# Patient Record
Sex: Female | Born: 1965 | Race: White | Hispanic: No | Smoking: Former smoker
Health system: Southern US, Community
[De-identification: ages and names within clinical notes are randomized; demographics above are authoritative.]

## PROBLEM LIST (undated history)

## (undated) DIAGNOSIS — M545 Low back pain, unspecified: Secondary | ICD-10-CM

## (undated) DIAGNOSIS — D6851 Activated protein C resistance: Principal | ICD-10-CM

## (undated) DIAGNOSIS — R7303 Prediabetes: Secondary | ICD-10-CM

## (undated) DIAGNOSIS — D519 Vitamin B12 deficiency anemia, unspecified: Secondary | ICD-10-CM

## (undated) DIAGNOSIS — G629 Polyneuropathy, unspecified: Secondary | ICD-10-CM

## (undated) DIAGNOSIS — O99891 Other specified diseases and conditions complicating pregnancy: Secondary | ICD-10-CM

## (undated) DIAGNOSIS — R7301 Impaired fasting glucose: Secondary | ICD-10-CM

## (undated) DIAGNOSIS — Z9889 Other specified postprocedural states: Secondary | ICD-10-CM

## (undated) DIAGNOSIS — N390 Urinary tract infection, site not specified: Secondary | ICD-10-CM

## (undated) DIAGNOSIS — F32A Depression, unspecified: Secondary | ICD-10-CM

## (undated) DIAGNOSIS — O9989 Other specified diseases and conditions complicating pregnancy, childbirth and the puerperium: Secondary | ICD-10-CM

## (undated) DIAGNOSIS — G894 Chronic pain syndrome: Secondary | ICD-10-CM

## (undated) DIAGNOSIS — R112 Nausea with vomiting, unspecified: Secondary | ICD-10-CM

## (undated) DIAGNOSIS — R270 Ataxia, unspecified: Secondary | ICD-10-CM

## (undated) DIAGNOSIS — M797 Fibromyalgia: Secondary | ICD-10-CM

## (undated) DIAGNOSIS — F329 Major depressive disorder, single episode, unspecified: Secondary | ICD-10-CM

## (undated) DIAGNOSIS — F419 Anxiety disorder, unspecified: Secondary | ICD-10-CM

## (undated) DIAGNOSIS — J42 Unspecified chronic bronchitis: Secondary | ICD-10-CM

## (undated) DIAGNOSIS — J449 Chronic obstructive pulmonary disease, unspecified: Secondary | ICD-10-CM

## (undated) DIAGNOSIS — M6281 Muscle weakness (generalized): Secondary | ICD-10-CM

## (undated) DIAGNOSIS — M1990 Unspecified osteoarthritis, unspecified site: Secondary | ICD-10-CM

## (undated) DIAGNOSIS — G8929 Other chronic pain: Secondary | ICD-10-CM

## (undated) DIAGNOSIS — K219 Gastro-esophageal reflux disease without esophagitis: Secondary | ICD-10-CM

## (undated) DIAGNOSIS — F509 Eating disorder, unspecified: Secondary | ICD-10-CM

## (undated) DIAGNOSIS — Z9189 Other specified personal risk factors, not elsewhere classified: Secondary | ICD-10-CM

## (undated) DIAGNOSIS — R609 Edema, unspecified: Secondary | ICD-10-CM

## (undated) DIAGNOSIS — I10 Essential (primary) hypertension: Secondary | ICD-10-CM

## (undated) DIAGNOSIS — G35 Multiple sclerosis: Secondary | ICD-10-CM

## (undated) DIAGNOSIS — G43909 Migraine, unspecified, not intractable, without status migrainosus: Secondary | ICD-10-CM

## (undated) HISTORY — PX: ABDOMINAL HYSTERECTOMY: SHX81

## (undated) HISTORY — DX: Eating disorder, unspecified: F50.9

## (undated) HISTORY — DX: Low back pain, unspecified: M54.50

## (undated) HISTORY — DX: Activated protein C resistance: D68.51

## (undated) HISTORY — DX: Migraine, unspecified, not intractable, without status migrainosus: G43.909

## (undated) HISTORY — DX: Essential (primary) hypertension: I10

## (undated) HISTORY — DX: Other specified personal risk factors, not elsewhere classified: Z91.89

## (undated) HISTORY — DX: Other chronic pain: G89.29

## (undated) HISTORY — PX: TONSILLECTOMY: SUR1361

## (undated) HISTORY — PX: ANTERIOR CERVICAL DECOMP/DISCECTOMY FUSION: SHX1161

## (undated) HISTORY — DX: Other specified diseases and conditions complicating pregnancy, childbirth and the puerperium: O99.89

## (undated) HISTORY — DX: Impaired fasting glucose: R73.01

## (undated) HISTORY — DX: Other specified diseases and conditions complicating pregnancy: O99.891

## (undated) HISTORY — DX: Low back pain: M54.5

## (undated) HISTORY — PX: APPENDECTOMY: SHX54

## (undated) HISTORY — DX: Fibromyalgia: M79.7

## (undated) NOTE — *Deleted (*Deleted)
Transition of Care [facility]) - CM/SW Discharge Note   Patient Details  Name: [name] MRN: [id number] Date of Birth: Jul 31, 1966  Transition of Care [facility]) CM/SW Contact:  Barry Brunner, LCSW Phone Number: 08/29/2020, 10:29 AM   Clinical Narrative:    CSW received HH orders for patient. CSW spoke with patient to identify if patient agreeable to [facility]. Patient declined [facility] services and reported that she had an aid. CSW informed patient that [facility] would provide physical therapy separate from the help the aid provides. Patient reported that she did not feel physical therapy would be effective after participating in OPPT with the [facility] location. Patient continues to decline HH. CSW notified of patient's need for ambulance transport. CSW completed med necessity and contacted EMS. TOC signing off.    Final next level of care: Home/Self Care     Patient Goals and CMS Choice        Discharge Placement                Patient to be transferred to facility by: N/A Name of family member notified: N/A Patient and family notified of of transfer: 08/29/20  Discharge Plan and Services                                     Social Determinants of Health (SDOH) Interventions     Readmission Risk Interventions No flowsheet data found.

---

## 2002-07-29 ENCOUNTER — Ambulatory Visit (HOSPITAL_COMMUNITY): Admission: RE | Admit: 2002-07-29 | Discharge: 2002-07-29 | Payer: Self-pay | Admitting: Family Medicine

## 2002-07-29 ENCOUNTER — Encounter: Payer: Self-pay | Admitting: Family Medicine

## 2002-08-07 ENCOUNTER — Observation Stay (HOSPITAL_COMMUNITY): Admission: RE | Admit: 2002-08-07 | Discharge: 2002-08-08 | Payer: Self-pay | Admitting: Obstetrics and Gynecology

## 2002-12-04 ENCOUNTER — Encounter: Payer: Self-pay | Admitting: Family Medicine

## 2002-12-04 ENCOUNTER — Ambulatory Visit (HOSPITAL_COMMUNITY): Admission: RE | Admit: 2002-12-04 | Discharge: 2002-12-04 | Payer: Self-pay | Admitting: Family Medicine

## 2003-01-23 ENCOUNTER — Ambulatory Visit (HOSPITAL_COMMUNITY): Admission: RE | Admit: 2003-01-23 | Discharge: 2003-01-23 | Payer: Self-pay | Admitting: Family Medicine

## 2003-01-23 ENCOUNTER — Encounter: Payer: Self-pay | Admitting: Family Medicine

## 2003-04-17 ENCOUNTER — Encounter (HOSPITAL_COMMUNITY): Admission: RE | Admit: 2003-04-17 | Discharge: 2003-05-17 | Payer: Self-pay | Admitting: Family Medicine

## 2003-04-17 ENCOUNTER — Encounter: Payer: Self-pay | Admitting: Family Medicine

## 2003-07-22 ENCOUNTER — Ambulatory Visit (HOSPITAL_COMMUNITY): Admission: RE | Admit: 2003-07-22 | Discharge: 2003-07-22 | Payer: Self-pay | Admitting: Family Medicine

## 2003-07-22 ENCOUNTER — Encounter: Payer: Self-pay | Admitting: Family Medicine

## 2003-09-09 ENCOUNTER — Ambulatory Visit (HOSPITAL_COMMUNITY): Admission: RE | Admit: 2003-09-09 | Discharge: 2003-09-09 | Payer: Self-pay | Admitting: Family Medicine

## 2003-10-30 ENCOUNTER — Ambulatory Visit (HOSPITAL_COMMUNITY): Admission: RE | Admit: 2003-10-30 | Discharge: 2003-10-30 | Payer: Self-pay | Admitting: Family Medicine

## 2005-04-20 ENCOUNTER — Ambulatory Visit: Payer: Self-pay | Admitting: Orthopedic Surgery

## 2005-05-29 ENCOUNTER — Ambulatory Visit (HOSPITAL_COMMUNITY): Admission: RE | Admit: 2005-05-29 | Discharge: 2005-05-29 | Payer: Self-pay | Admitting: Family Medicine

## 2005-05-31 ENCOUNTER — Ambulatory Visit (HOSPITAL_COMMUNITY): Admission: RE | Admit: 2005-05-31 | Discharge: 2005-05-31 | Payer: Self-pay | Admitting: Family Medicine

## 2005-12-15 ENCOUNTER — Ambulatory Visit (HOSPITAL_COMMUNITY): Admission: RE | Admit: 2005-12-15 | Discharge: 2005-12-15 | Payer: Self-pay | Admitting: Family Medicine

## 2007-06-28 ENCOUNTER — Ambulatory Visit (HOSPITAL_COMMUNITY): Admission: RE | Admit: 2007-06-28 | Discharge: 2007-06-28 | Payer: Self-pay | Admitting: Family Medicine

## 2007-10-30 ENCOUNTER — Encounter: Admission: RE | Admit: 2007-10-30 | Discharge: 2007-10-30 | Payer: Self-pay | Admitting: Neurosurgery

## 2007-11-25 ENCOUNTER — Ambulatory Visit (HOSPITAL_COMMUNITY): Admission: RE | Admit: 2007-11-25 | Discharge: 2007-11-25 | Payer: Self-pay | Admitting: Neurosurgery

## 2007-12-26 ENCOUNTER — Encounter: Admission: RE | Admit: 2007-12-26 | Discharge: 2007-12-26 | Payer: Self-pay | Admitting: Neurosurgery

## 2008-02-06 ENCOUNTER — Encounter: Admission: RE | Admit: 2008-02-06 | Discharge: 2008-02-06 | Payer: Self-pay | Admitting: Neurosurgery

## 2008-02-21 ENCOUNTER — Encounter: Admission: RE | Admit: 2008-02-21 | Discharge: 2008-02-21 | Payer: Self-pay | Admitting: Neurosurgery

## 2008-03-19 ENCOUNTER — Encounter: Admission: RE | Admit: 2008-03-19 | Discharge: 2008-03-19 | Payer: Self-pay | Admitting: Neurosurgery

## 2008-05-19 ENCOUNTER — Encounter: Admission: RE | Admit: 2008-05-19 | Discharge: 2008-05-19 | Payer: Self-pay | Admitting: Neurosurgery

## 2008-07-16 ENCOUNTER — Encounter: Admission: RE | Admit: 2008-07-16 | Discharge: 2008-07-16 | Payer: Self-pay | Admitting: Neurosurgery

## 2008-08-26 ENCOUNTER — Encounter: Admission: RE | Admit: 2008-08-26 | Discharge: 2008-08-26 | Payer: Self-pay | Admitting: Neurosurgery

## 2008-09-23 ENCOUNTER — Encounter: Admission: RE | Admit: 2008-09-23 | Discharge: 2008-09-23 | Payer: Self-pay | Admitting: Neurosurgery

## 2008-11-17 ENCOUNTER — Ambulatory Visit (HOSPITAL_COMMUNITY): Admission: RE | Admit: 2008-11-17 | Discharge: 2008-11-17 | Payer: Self-pay | Admitting: Family Medicine

## 2008-11-25 ENCOUNTER — Ambulatory Visit (HOSPITAL_COMMUNITY): Admission: RE | Admit: 2008-11-25 | Discharge: 2008-11-25 | Payer: Self-pay | Admitting: Family Medicine

## 2008-11-27 ENCOUNTER — Ambulatory Visit (HOSPITAL_COMMUNITY): Admission: RE | Admit: 2008-11-27 | Discharge: 2008-11-27 | Payer: Self-pay | Admitting: Family Medicine

## 2009-02-05 ENCOUNTER — Ambulatory Visit (HOSPITAL_COMMUNITY): Admission: RE | Admit: 2009-02-05 | Discharge: 2009-02-05 | Payer: Self-pay | Admitting: Pulmonary Disease

## 2009-03-24 ENCOUNTER — Ambulatory Visit: Payer: Self-pay | Admitting: Cardiology

## 2009-03-31 ENCOUNTER — Ambulatory Visit: Payer: Self-pay | Admitting: Cardiovascular Disease

## 2009-03-31 ENCOUNTER — Inpatient Hospital Stay (HOSPITAL_BASED_OUTPATIENT_CLINIC_OR_DEPARTMENT_OTHER): Admission: RE | Admit: 2009-03-31 | Discharge: 2009-03-31 | Payer: Self-pay | Admitting: Cardiovascular Disease

## 2009-04-15 DIAGNOSIS — R079 Chest pain, unspecified: Secondary | ICD-10-CM | POA: Insufficient documentation

## 2009-04-15 DIAGNOSIS — R0602 Shortness of breath: Secondary | ICD-10-CM | POA: Insufficient documentation

## 2009-04-15 DIAGNOSIS — R42 Dizziness and giddiness: Secondary | ICD-10-CM | POA: Insufficient documentation

## 2009-04-16 ENCOUNTER — Ambulatory Visit: Payer: Self-pay | Admitting: Cardiology

## 2009-04-28 ENCOUNTER — Ambulatory Visit (HOSPITAL_COMMUNITY): Admission: RE | Admit: 2009-04-28 | Discharge: 2009-04-28 | Payer: Self-pay | Admitting: Family Medicine

## 2009-11-11 ENCOUNTER — Encounter: Admission: RE | Admit: 2009-11-11 | Discharge: 2009-11-11 | Payer: Self-pay | Admitting: Neurosurgery

## 2009-11-19 ENCOUNTER — Ambulatory Visit (HOSPITAL_COMMUNITY): Admission: RE | Admit: 2009-11-19 | Discharge: 2009-11-19 | Payer: Self-pay | Admitting: Family Medicine

## 2010-02-11 ENCOUNTER — Ambulatory Visit (HOSPITAL_COMMUNITY): Admission: RE | Admit: 2010-02-11 | Discharge: 2010-02-11 | Payer: Self-pay | Admitting: Family Medicine

## 2010-03-11 ENCOUNTER — Encounter: Admission: RE | Admit: 2010-03-11 | Discharge: 2010-03-11 | Payer: Self-pay | Admitting: Neurosurgery

## 2010-04-21 ENCOUNTER — Encounter: Admission: RE | Admit: 2010-04-21 | Discharge: 2010-04-21 | Payer: Self-pay | Admitting: Neurosurgery

## 2010-06-02 ENCOUNTER — Encounter: Admission: RE | Admit: 2010-06-02 | Discharge: 2010-06-02 | Payer: Self-pay | Admitting: Neurosurgery

## 2010-07-19 ENCOUNTER — Encounter: Admission: RE | Admit: 2010-07-19 | Discharge: 2010-07-19 | Payer: Self-pay | Admitting: Neurosurgery

## 2010-08-18 ENCOUNTER — Encounter: Admission: RE | Admit: 2010-08-18 | Discharge: 2010-08-18 | Payer: Self-pay | Admitting: Neurosurgery

## 2010-10-30 ENCOUNTER — Encounter: Payer: Self-pay | Admitting: Family Medicine

## 2010-11-17 ENCOUNTER — Other Ambulatory Visit (HOSPITAL_COMMUNITY): Payer: Self-pay | Admitting: Neurosurgery

## 2010-11-17 ENCOUNTER — Other Ambulatory Visit: Payer: Self-pay | Admitting: Neurosurgery

## 2010-11-17 ENCOUNTER — Ambulatory Visit
Admission: RE | Admit: 2010-11-17 | Discharge: 2010-11-17 | Disposition: A | Discharge status: Home / Self Care | Payer: Self-pay | Source: Ambulatory Visit | Attending: Neurosurgery | Admitting: Neurosurgery

## 2010-11-17 DIAGNOSIS — M542 Cervicalgia: Secondary | ICD-10-CM

## 2011-01-18 LAB — BLOOD GAS, ARTERIAL
Acid-base deficit: 0.5 mmol/L (ref 0.0–2.0)
pCO2 arterial: 39.3 mmHg (ref 35.0–45.0)

## 2011-02-21 NOTE — Assessment & Plan Note (Signed)
[facility]                       [place] [facility] OFFICE NOTE   NAME:[name], [name]                       MRN:          [id number]  DATE:03/24/2009                            DOB:          25-Mar-1966    REASON FOR REFERRAL:  Chest pain, shortness of breath, and dizziness.   HISTORY OF PRESENT ILLNESS:  Ms. [name] is a 45 year old female patient  with long history of smoking, who presents for further evaluation from  Dr. Fletcher Anon office.  She has never had a history of coronary artery  disease.  She tells me that she had a history of hypertension at one  point, but took medications that made her blood pressure increase.  She  is no longer on medications.  She has recently noted exertional chest  discomfort and shortness of breath.  This has probably been going on for  last 2-3 months.  She walks on a daily basis.  This usually occurs with  walking.  It resolves with rest.  It is described as a substernal  heaviness/tightness.  She denies any radiating symptoms.  She notes  significant shortness of breath associated with it.  She does note  associated diaphoresis and nausea.  She denies syncope.  She has noted  some problems with dizziness.  She really describes this as a spinning  sensation.  The symptoms can occur with certain head movements or  changes in positioning.  She denies any syncope.  She denies any  dizziness brought on by exertion.  She has checked her pressures several  times and associated her symptoms with hypotension.  The high systolic  reading, she got was 111 and the lowest she got was 99.  She has noted  some headaches.  She did have blood work performed recently by Dr.  Gerda Diss.  Her hemoglobin was 15.7, potassium 3.8, creatinine 0.78, TSH  2.157.   PAST MEDICAL HISTORY:  1. Dyslipidemia.  Her lipid panel from 2008 demonstrated total      cholesterol of 222, triglyceride 349, HDL 36, LDL 116.  2. Questionable history of  hypertension.  3. COPD.  4. Migraine headaches.  5. Degenerative disk disease, status post cervical spine surgery in      2009.  6. Status post total abdominal hysterectomy/bilateral salpingo-      oophorectomy.  7. History of ovarian cysts.   MEDICATIONS:  1. Advair 250/50 b.i.d.  2. Diazepam 5 mg nightly.  3. Estradiol 1 mg daily.  4. Gabapentin 300 mg 1-2 tablets nightly.  5. Isometh/Dichlor/APAP p.r.n.  6. Promethazine p.r.n.  7. Endocet p.r.n.   ALLERGIES:  CODEINE, PENICILLIN, AMOXICILLIN, HOSPITAL TAPE.   SOCIAL HISTORY:  The patient has a 65+ pack-year history of smoking.  She has recently reduced her amount to 1-pack per day.  She denies  alcohol or drug abuse.   FAMILY HISTORY:  Quite significant for premature CAD.  Her father died  of a myocardial infarction at age 9.  Her mother had stroke, but  eventually died from lung cancer at age 94.   REVIEW OF SYSTEMS:  Please see HPI.  She has  had some headaches.  She  has had some fevers recently.  She notes a temperature of about 100.4.  She denies any significant cough, sore throat, sinus congestion.  She  denies dysuria or hematuria.  She denies melena or hematochezia.  She  denies dysphagia or odynophagia.  She has occasional dyspepsia.  All  other systems are reviewed and negative.   PHYSICAL EXAMINATION:  GENERAL:  She is a well-nourished, well-developed  female.  VITAL SIGNS:  Blood pressure is 120/83 with a pulse of 64; lying 115/79,  with a pulse of 64; sitting 109/75, with a pulse of 64; standing after 2  minutes 113/72, with a pulse of 64, weight 173 pounds.  HEENT:  Normal.  NECK:  Without JVD.  LYMPH:  Without lymphadenopathy.  ENDOCRINE:  Without thyromegaly.  CARDIAC:  Normal S1 and S2.  Regular rate and rhythm.  No murmur.  LUNGS:  Clear to auscultation bilaterally.  No wheezing or rales.  ABDOMEN:  Soft, nontender with normal active bowel sounds.  No  organomegaly.  EXTREMITIES:  Without edema.   SKIN:  Warm and dry.  NEUROLOGIC:  She is alert and oriented x3.  Cranial nerves II through  XII grossly intact.  VASCULAR:  No carotid bruits noted bilaterally.  Femoral pulses are  difficult to palpate without bruits bilaterally.  Dorsalis pedis and  posterior tibialis pulses are 2+ bilaterally.   Electrocardiogram reveals sinus rhythm with a heart rate of 68, normal  axis, nonspecific ST-T wave changes.   ASSESSMENT AND PLAN:  1. Chest pain and shortness of breath.  Ms. [name] has cardiac risk      factors that include dyslipidemia, family history of premature CAD,      and significant smoking history.  Her symptoms could be entirely      explained by significant pulmonary pathology from COPD and ongoing      tobacco abuse.  She has been advised to quit smoking.  In any      event, her symptoms are concerning for angina.  She has also been      interviewed and examined by Dr. Dietrich Pates.  At this point, we have      recommended proceeding with cardiac catheterization to further      evaluate her symptoms.  Risks and benefits of the procedure have      been explained to the patient.  She agrees to proceed.  We will set      up as an outpatient in the next 1-week.  She has been asked to      start on aspirin a day.  She will be given prescription for      nitroglycerin to take p.r.n.  We will see her back after her      cardiac catheterization.  If her cardiac catheterization is      unrevealing, we may need to consider further pulmonary workup at      that time.  2. Dizziness.  This sounds to be more consistent with benign      paroxysmal positional vertigo.  I was able to reproduce some of her      symptoms with a modified Dix-Hallpike maneuver in the office.  She      will likely need further workup with her primary care physician.      It does not appear that hypotension is playing a role in any of her      symptoms.  She has been reassured about this.  3. Dyslipidemia.  She will  have lipids and LFTs checked with her pre      cath labs.  She will likely require treatment for this.  4. Chronic obstructive pulmonary disease with ongoing tobacco abuse.      As noted above, she has been asked to quit smoking.   DISPOSITION:  She will return for followup postcatheterization with Dr.  Dietrich Pates.      Tereso Newcomer, PA-C  Electronically Signed      [name]. Daleen Squibb, MD, [facility]  Electronically Signed   SW/MedQ  DD: 03/24/2009  DT: 03/25/2009  Job #: [id number]   cc:   Lorin Picket A. Gerda Diss, MD

## 2011-02-21 NOTE — Assessment & Plan Note (Signed)
[facility]                       [place] [facility] OFFICE NOTE   NAME:[name], [name]                       MRN:          [id number]  DATE:04/16/2009                            DOB:          09-07-1966    [name] returns today after catheterization for dyspnea on exertion  and multiple cardiac risk factors.   Her cardiac catheterization showed no obstructive disease, essentially  normal coronary arteries angiographically.  She has normal left  ventricular function.  It was felt that her dyspnea on exertion with  COPD related with a heavy history of tobacco use, and unfortunately she  continues to smoke.   She has had no complications since the catheterization.  Her meds are  unchanged.   We drew a lipid panel per her request on last visit.  She has classic  mixed hyperlipidemia.  Total cholesterol of 175, triglycerides of 271,  HDL 36, VLDL of 54, total cholesterol to HDL ratio 4.9, LDL of 85.   I had a nice conversation with Ms. Culhane today.  I have encouraged her  to quit smoking altogether.  I have advised her to take fish oil over  the counter and to try to drop her weight.  We will see her back on a  p.r.n. basis.     Thomas C. Daleen Squibb, MD, [facility]  Electronically Signed    TCW/MedQ  DD: 04/16/2009  DT: 04/17/2009  Job #: [id number]   cc:   Lorin Picket A. Gerda Diss, MD

## 2011-02-21 NOTE — Procedures (Signed)
[name], [name]                ACCOUNT NO.:  [id number]   MEDICAL RECORD NO.:  [id number]          PATIENT TYPE:  OUT   LOCATION:  RESP                          FACILITY:  [facility]   PHYSICIAN:  Edward L. Juanetta Gosling, M.D.DATE OF BIRTH:  [birth year]   DATE OF PROCEDURE:  DATE OF DISCHARGE:                            PULMONARY FUNCTION TEST   This study is spirometry only.   1. Spirometry shows no definite ventilatory defect, but does show      evidence of airflow obstruction and this is most marked in the      smaller airways.  2. There is no significant bronchodilator improvement.      Edward L. Juanetta Gosling, M.D.  Electronically Signed     ELH/MEDQ  D:  11/18/2008  T:  11/18/2008  Job:  [id number]   cc:   Lorin Picket A. Gerda Diss, MD  Fax: [phone]

## 2011-02-21 NOTE — Op Note (Signed)
NAMEFRUMA, AFRICA                ACCOUNT NO.:  [id number]   MEDICAL RECORD NO.:  [id number]          PATIENT TYPE:  OIB   LOCATION:  3528                         FACILITY:  [facility]   PHYSICIAN:  Donalee Citrin, M.D.        DATE OF BIRTH:  1965-11-25   DATE OF PROCEDURE:  11/25/2007  DATE OF DISCHARGE:  11/25/2007                               OPERATIVE REPORT   PREOPERATIVE DIAGNOSIS:  Cervical spondylosis with spondylitic  radiculopathy, C6-7; with C7 left-sided radiculopathy.   PROCEDURE:  Anterior cervical diskectomy and fusion, C6-7; using an 8 mm  Lichtman allograft wedge and a 25 mm Venture plate with four 13 mm varus  screws.   SURGEON:  Donalee Citrin, M.D.   ASSISTANT:  Kathaleen Maser. Pool, M.D.   ANESTHESIA:  Endotracheal.   DESCRIPTION OF PROCEDURE:  The patient is a very pleasant 45 year old  female who has had longstanding neck pain, headaches and pain that  radiated down her left arm through her triceps and the first fingers of  the left hand; consistent with a C7 nerve root pattern.  The patient  failed all forms of treatment and underwent MRI scans and subsequent  myelography; that showed filling defect to the left C7 nerve root.  The  patient was recommended to do cervical diskectomy and fusion.  The risks  and benefits of the operation were explained to the patient, who  understood and signed consent form to go forward.   The patient was brought to the operating room and after satisfactory  general endotracheal anesthesia was placed supine on the Wilson frame.  The neck was slightly flexed with 5 pounds of Holter traction.  The  right side of the neck was prepped and draped in the usual sterile  fashion.  __________  was used to localize the appropriate level.  A  curvilinear incision was made just over the midline to the anterior  border of the sternocleidomastoid on the right side.  The superficial  layer of the platysma was then dissected out and divided longitudinally.  The avascular plane was circumscribed and strap muscles were developed  down to the prevertebral fascia.  The prevertebral fascia then dissected  with the Kittners.  Intraoperative microscope brought in to confirm  location at C5-6.  Attention was then taken one disk space below this.  Annulotomy was then achieved with a scalpel, and marked the disk space.  The disk space was then out.  A 3 mm Kerrison was used to bite off the  anterior osteophytes coming off the C6 vertebral body.  Then a high-  speed drill was used to drill down the disk space to the posterior  annulus-posterior osteophyte complexes.  At this point, the operating  microscope was draped and brought up to 300 mc of illumination.  Panning  of the undersurface of the endplates at both C5 and C6 were aggressively  underbitten; removing the posterior longitudinal ligament and exposing  the thecal sac.  Aggressive underbody removal was carried out, marching  across to the C7 nerve root on the left side.  There was  noted to be  marked inflammatory tissue and ligamentous hypertrophy in the disk  material,  displacing the superior aspect of the C7 nerve root.  This  was teased away with a nerve hook and removed with #2 and #3 Kerrison  punches; skeletonizing the C7 nerve root flush with the pedicle  significantly out the foramen.  At the end of the foraminotomy there was  no further stenosis of foramina.  The nerve hook easily passed out  distally in the foramen; the pedicle was easily palpated.  The  undersurface of both endplates were aggressively underbitten and cross  marched across the right side; and the right C7 nerve was decompressed  as well.  At the end of the diskectomy the endplates were scraped.   Attention taken to the bone graft; a size 8 graft was inserted.  __________ .  Then a 25 mm Venture plate was placed.  All screws had  excellent purchase and locking mechanism was engaged.  Postoperative  fluoroscopy  confirmed good position of plate, screws and bone grafts.  Then, after confirmation of adequate hemostasis, the wound was then  closed with interrupted Vicryls in the platysma and running 4-0  subcuticular.  Benzoin and Steri-Strips then applied.  The patient went  to the recovery room in stable condition.  At the end of the case all  sponge, instrument and needle counts were correct.           ______________________________  Donalee Citrin, M.D.     GC/MEDQ  D:  11/25/2007  T:  11/26/2007  Job:  [id number]

## 2011-02-21 NOTE — Cardiovascular Report (Signed)
NAMEMELIYAH, SIMON                ACCOUNT NO.:  [id number]   MEDICAL RECORD NO.:  [id number]          PATIENT TYPE:  OIB   LOCATION:  1961                         FACILITY:  [facility]   PHYSICIAN:  Peter C. Eden Emms, MD, FACCDATE OF BIRTH:  [birth year]   DATE OF PROCEDURE:  DATE OF DISCHARGE:  03/31/2009                            CARDIAC CATHETERIZATION   A 45 year old patient of Dr. Dietrich Pates and Dr. Lilyan Punt with chest  pain and shortness of breath, multiple coronary risk factors.  Cine  catheterization was done with 4-French catheters from the right femoral  artery.   Left main coronary was normal.   Left anterior descending artery was normal.  First and second diagonal  branches were normal.   The circumflex coronary artery was dominant.  The first obtuse marginal  branch was small in branching.  It was normal.  Second obtuse marginal  branch was large and normal.  The distal circ and a single PDA were  normal.   The right coronary artery was small and nondominant and it was normal.   RAO ventriculography.  RAO ventriculography was normal.  EF was 60%.  There was no gradient across aortic valve and no MR.  LV pressure was  117/20.  Aortic pressure was 117/65.   IMPRESSION:  The patient has no significant coronary artery disease.  Her symptoms what appeared to be primarily dyspnea from her COPD.  She  has already been encouraged to stop smoking.  She will follow up with  Dr. Dietrich Pates and Dr. Lilyan Punt to help further these attempts.  She  would deemed to be a candidate for nicotine replacement therapy if she  does not want to try Wellbutrin or Chantix and she does not have  coronary artery disease.   She tolerated the procedure well.      Noralyn Pick. Eden Emms, MD, [facility]  Electronically Signed     PCN/MEDQ  D:  03/31/2009  T:  03/31/2009  Job:  [id number]   cc:   Lorin Picket A. Gerda Diss, MD  Gerrit Friends. Dietrich Pates, MD, [facility]

## 2011-02-24 NOTE — Procedures (Signed)
[name], [name]                ACCOUNT NO.:  [id number]   MEDICAL RECORD NO.:  [id number]          PATIENT TYPE:  OUT   LOCATION:  RESP                          FACILITY:  [facility]   PHYSICIAN:  Edward L. Juanetta Gosling, M.D.DATE OF BIRTH:  Aug 14, 1966   DATE OF PROCEDURE:  DATE OF DISCHARGE:                            PULMONARY FUNCTION TEST   PULMONARY FUNCTION TEST  1. Spirometry shows no definite ventilatory defect, but evidence of      airflow obstruction, most marked in the smaller airways.  2. Lung volumes show air trapping.  3. DLCO is normal.  4. Arterial blood gases normal.  5. This study is consistent with COPD.      Edward L. Juanetta Gosling, M.D.  Electronically Signed     ELH/MEDQ  D:  02/09/2009  T:  02/10/2009  Job:  [id number]

## 2011-02-24 NOTE — H&P (Signed)
   NAME:  [name], [name]                          ACCOUNT NO.:  [id number]   MEDICAL RECORD NO.:  [id number]                   PATIENT TYPE:  AMB   LOCATION:  DAY                                  FACILITY:  [facility]   PHYSICIAN:  Tilda Burrow, M.D.              DATE OF BIRTH:  Feb 24, 1966   DATE OF ADMISSION:  DATE OF DISCHARGE:                                HISTORY & PHYSICAL   ADMITTING DIAGNOSIS:  Symptomatic left ovarian cyst status post  hysterectomy.   HISTORY OF PRESENT ILLNESS:  This 45 year old female gravida 3, para 3-0-0-2   NOTE:  Dictation cut off at this point.                                                  Tilda Burrow, M.D.    JVF/MEDQ  D:  08/06/2002  T:  08/06/2002  Job:  [id number]

## 2011-02-24 NOTE — H&P (Signed)
NAME:  [name], [name]                          ACCOUNT NO.:  [id number]   MEDICAL RECORD NO.:  [id number]                   PATIENT TYPE:  AMB   LOCATION:  DAY                                  FACILITY:  [facility]   PHYSICIAN:  Tilda Burrow, M.D.              DATE OF BIRTH:  1966/04/19   DATE OF ADMISSION:  08/06/2002  DATE OF DISCHARGE:                                HISTORY & PHYSICAL   ADMISSION DIAGNOSIS:  Symptomatic left ovarian cyst.   HISTORY OF PRESENT ILLNESS:  This 45 year old female, gravida 3, para 3,  status post cesarean section x3, with partial hysterectomy in 1996, is  admitted at this time for left salpingo-oophorectomy.  Planned serial  laparoscopic approach.  The patient is seen on our office in consultation  after referral from Dr. Lilyan Punt for painful symptomatic ovarian cyst.  The patient is debilitated by the severity of the pain.  She has had  discomfort and vomiting and being sick on her stomach due to the degree of  discomfort.  She complains of mild left lower quadrant dyspareunia of a long-  standing nature since hysterectomy.   The patient specifically states she has no right-sided pain, and after a  decision is made for surgery, she very much wishes to have the right ovary  saved if it can be preserved.  The plans are for an intraoperative decision.   PAST MEDICAL HISTORY:  Benign.   PAST SURGICAL HISTORY:  1. C-section x2, 1982, 1984, 1986.  2. Tonsillectomy, 1994.  3. Partial hysterectomy in 1996.   ALLERGIES:  CODEINE.   MEDICATIONS:  1. Darvocet.  2. Motrin.   ANESTHESIA HISTORY:  Notable for that the patient had an extremely  complicated course due to IV infiltration after her hysterectomy.  She was  out of work six weeks due to reaction to the IV which resulted in  transient disability to her left hand.   REVIEW OF SYSTEMS:  Positive for migraine headaches.  Negative for bleeding  disorders, shortness of breath, chest pain,  difficulty on exertion,  abdominal discomfort, bowel abnormalities.  She has never had transfusions.   FAMILY HISTORY:  Notable for heart disease.   SOCIAL HISTORY:  Positive for smoking 1-1/2 packs per day.  Negative for  alcohol or recreational drugs.  She has a 12th grade education.  Pap smear  normal, July 29, 2002.   PHYSICAL EXAMINATION:  VITAL SIGNS:  Height 4 feet 11 inches, weight 145,  blood pressure 110/65.  GENERAL:  Shows a healthy-appearing Caucasian female, alert, oriented x3.  HEENT:  Pupils equal, round, and reactive.  NECK:  Supple.  Trachea midline.  CARDIOVASCULAR:  Unremarkable.  BREASTS:  Exam deferred.  ABDOMEN:  Well-healed Pfannenstiel incision.  GENITALIA:  External genitalia normal female.  There are no vaginal  deliveries to support.  There is good functional vaginal length with vaginal  cuff tender on  the left side.  Ultrasound shows a cyst on the left adnexa,  2.4 x 2.8 cm in the area of the patient's pain.  On ultrasound there is a  small amount of fluid.  The right ovary is normal.   IMPRESSION:  1. Symptomatic left ovarian cyst, possible pelvic lesions.  2. Mild long term dyspareunia.   PLAN:  Laparoscopic left salpingo-oophorectomy.  Possible lysis of  adhesions.  Will evaluate right ovary as an intraoperative decision.   ADDENDUM:  The patient has been counseled and is fully aware of the risks of  surgical procedure, including the need for a full laparotomy in the case  that adhesions preclude access to pelvis, with possibilities of injury to  internal organs, bowel or bladder specifically mentioned.                                                  Tilda Burrow, M.D.    JVF/MEDQ  D:  08/06/2002  T:  08/06/2002  Job:  [id number]

## 2011-02-24 NOTE — Procedures (Signed)
NAMEMILDA, LINDVALL                          ACCOUNT NO.:  [id number]   MEDICAL RECORD NO.:  [id number]                   PATIENT TYPE:  OUT   LOCATION:  RAD                                  FACILITY:  [facility]   PHYSICIAN:  [name] A. Gerda Diss, M.D.               DATE OF BIRTH:  Aug 01, 1966   DATE OF PROCEDURE:  DATE OF DISCHARGE:  09/09/2003                                    STRESS TEST   PROCEDURE:  Stress test standard Bruce protocol.   INDICATIONS:  Resting EKG:  No acute ST segment changes.   CONCLUSIONS:  Heart rate response exercise - The patient had a gradual  increase in heart rate over the course of first 3 stages and never reached  her peak heart rate but reached a maximum of 152.   ST segment response exercise - No acute ST segment changes were noted.   Blood pressure response exercise - The patient had slight increase in blood  pressure but was not considered hypertensive.   Recovery phase uneventful.   Symptomatology - Shortness of breath.   INTERPRETATION AND PLAN:  Normal stress test with poor exercise tolerance.  Encourage regular walking program.  Await Cardiolite images.      ___________________________________________                                            Jonna Coup Gerda Diss, M.D.   SAL/MEDQ  D:  09/18/2003  T:  09/19/2003  Job:  [id number]

## 2011-02-24 NOTE — Consult Note (Signed)
NAMECHRISTABELLE, [name]                            ACCOUNT NO.:  [id number]   MEDICAL RECORD NO.:  [id number]                  PATIENT TYPE:   LOCATION:                                       FACILITY:   PHYSICIAN:  Lionel December, M.D.                 DATE OF BIRTH:  [birth year]   DATE OF CONSULTATION:  05/06/2003  DATE OF DISCHARGE:                                   CONSULTATION   GASTROINTESTINAL CONSULTATION   REQUESTING PHYSICIAN:  [name] A. Gerda Diss, M.D.   REASON FOR CONSULTATION:  Recurrent gastritis, gastroesophageal reflux  disease.   HISTORY OF PRESENT ILLNESS:  The patient is a 45 year old Caucasian female,  patient of Dr. Lilyan Punt.  She presents today for further evaluation of a  several month history of epigastric pain.  She has been treated for  recurrent gastritis, gastroesophageal reflux disease, as well as for  positive H. pylori antibody test.  She has been on Protonix for several  months.  Symptoms have partially responded to medication.  The patient  complains of intermittent epigastric pain associated with nausea.  Denies  any vomiting.  She wakes up in the morning with nausea which seems to  improve when she eats.  She has occasional heartburn symptoms, at this  point.  Denies any dysphagia or odynophagia, melena or rectal bleeding,  constipation, or diarrhea.   Thus far she has had a HIDA scan with a gallbladder ejection fraction of  50.1%.  She has a normal abdominal ultrasound. LFTs have been normal.  as  well as a lipase.   CURRENT MEDICATIONS:  1. Estrogen.  2. Protonix 40 mg daily.  3. An antidepressant.  4. Carafate.  5. The patient states that she is on other medications which she will call     [place] with later today.  6. Denies any NSAIDS.   ALLERGIES:  CODEINE.   PAST MEDICAL HISTORY:  1. Depression.  2. Partial hysterectomy initially in October 2003 and became complete     hysterectomy with removal of both ovaries.  3. She has had cesarean  section x3, tonsillectomy.   FAMILY HISTORY:  Denies any family history of colorectal cancer.  Her mother  has some issues with intermittent diarrhea.   SOCIAL HISTORY:  She is divorced has 2 children.  She was employed at  [employer]  She smokes a pack of cigarettes daily.  She has smoked over 20  years. She rarely consumes alcohol.   REVIEW OF SYSTEMS:  Please see HPI for GI.  GENERAL:  Denies any weight  loss.  CARDIOPULMONARY:  Denies any chest pain or shortness of breath.  GENITOURINARY: Denies any dysuria or hematuria.   PHYSICAL EXAMINATION:  VITAL SIGNS:  Weight 160. Height 4 feet 11 inches.  Temperature 98.2, blood pressure 98/72, pulse 70.  GENERAL:  A pleasant, well-nourished, well-developed, Caucasian female in no  acute distress.  SKIN:  Warm and dry.  No jaundice.  HEENT:  Conjunctivae are pink.  Sclerae anicteric. Oropharyngeal mucosa  moist and pink.  No lesions, erythema or exudate.  NECK:  No lymphadenopathy or thyromegaly.  CHEST:  Lungs are clear to auscultation.  CARDIOVASCULAR:  Cardiac exam reveals regular rate and rhythm.  Normal S1,  S2.  No murmurs, rubs, or gallops.  ABDOMEN:  Positive bowel sounds, soft, nondistended.  She has moderate  tenderness in the epigastric region to deep palpation. No rebound tenderness  or guarding.  No hepatosplenomegaly or masses.  EXTREMITIES:  No edema.   IMPRESSION:  The patient is a 45 year old lady with a several month history  of intermittent epigastric pain associated with nausea.  She typically has  nausea on a daily basis which improves with eating.  Her symptoms have be  due to gastroesophageal reflux disease.  We need to rule out gastritis or  peptic ulcer disease as well.  She has a history of positive H. pylori  antibody test and was treated for this in April of this year.  At this time  it does not appear that her symptoms are related to gallbladder.   PLAN:  1. EGD in the near future.  2. She will continue  Protonix 40 mg daily for now.  3. She will call us later today with a list of all of her medications.   I would like to thank Dr. Gerda Diss for allowing [place] to take part in the care of  this patient.     Tana Coast, P.A.                        Lionel December, M.D.    LL/MEDQ  D:  05/06/2003  T:  05/06/2003  Job:  [id number]   cc:   Lorin Picket A. Gerda Diss, M.D.  [address]  [place]  [place] [address]  Fax: [phone]

## 2011-02-24 NOTE — Op Note (Signed)
[name], [name]                          ACCOUNT NO.:  [id number]   MEDICAL RECORD NO.:  [id number]                   PATIENT TYPE:  AMB   LOCATION:  DAY                                  FACILITY:  [facility]   PHYSICIAN:  Tilda Burrow, M.D.              DATE OF BIRTH:  Aug 04, 1966   DATE OF PROCEDURE:  08/07/2002  DATE OF DISCHARGE:                                 OPERATIVE REPORT   PREOPERATIVE DIAGNOSIS:  Symptomatic left ovarian cyst.   POSTOPERATIVE DIAGNOSES:  1. Symptomatic left ovarian cyst.  2. Extensive pelvic adhesions.   PROCEDURES:  1. Diagnostic laparoscopy.  2. Laparotomy.  3. Bilateral salpingo-oophorectomy.  4. Appendectomy.  5. Lysis of adhesions.   SURGEON:  Tilda Burrow, M.D.   ASSISTANT:  __________ Karna Dupes.   ANESTHESIA:  General.   COMPLICATIONS:  None.   ESTIMATED BLOOD LOSS:  100 cc.   FINDINGS:  Extensive adhesions, bowel attached to ovaries with sigmoid colon  completely covering over the left adnexa, the bowel attached to the bladder  dome as well, with appendix attached to the anterior abdominal wall in the  midline, and omentum stuck densely to the anterior abdominal wall from the  umbilicus down to the appendix.   INDICATIONS:  This is a 45 year old female referred by Dr. Lilyan Punt, for  pelvic pain due to recurrent cyst in the left adnexa, requesting surgical  excision.  The plans were to preserve the right ovary if possible, but the  severity of adhesions and the close proximity to the vaginal cuff and bowel  precluded ovarian preservation.   DESCRIPTION OF PROCEDURE:  The patient was taken to the operating room,  prepped and draped in the usual fashion for combined abdominal and vaginal  surgery with the single-tooth tenaculum attached to the vaginal cuff.  The  Foley catheter was inserted.   An infraumbilical vertical 1 cm skin incision was made and attention  directed to the umbilicus.  The Veress needle was  introduced, water droplet  test used to confirm proper placement and pneumoperitoneum achieved.  Camera  directed laparoscopic insertion then was performed with a 10 mm camera.  There were omental adhesions from just below the umbilicus to the anterior  abdominal wall just above the symphysis.  The appendix could be visualized  attached to the anterior abdominal wall.  The decision was made relatively  promptly that this case precluded laparoscopic completion.  The left adnexa  was completely obscured by the sigmoid colon attached over its surfaces.   We then converted to a laparotomy with the legs positioned downward slightly  while being maintained in abducted position.  The vertical midline lower  abdominal incision was then repeated, abdominal cavity entered, and the  omental adhesions to the anterior abdominal wall removed.  The appendix was  identified and dissected free of its anterior abdominal wall adhesions.   Attention to the adnexa  was initiated, and we began to peel bowel free from  its attachments.  Once the sigmoid had been freed up enough that laparotomy  retractors could be positioned, we proceeded to pack away the bowel.  The  appendix was then removed.   Appendectomy:  The appendix was removed by grasping the appendix with a  Babcock clamp, elevating it, splitting the mesoappendix into two segments,  which were clamped with a Kelly clamp, and the appendix then grasped with a  hemostat.  A second hemostat was placed to control the appendiceal stump.  All three pedicles were transected and 2-0 chromic used to tie the three  pedicles.  The involved instruments for the appendix were passed off the  table with the specimen.   Bowel was packed away again and attention directed to the pelvis.  Gradual  patient dissection from the left pelvic brim led [place] to gradually identify  the surface of the left ovarian cyst.  There were extensive dense adhesions  to the side of the  bowel.  This required careful sharp dissection with the  __________  spreading technique used to keep things hemostatic and properly  oriented.  We then entered the retroperitoneum on the left side at the level  of the pelvic brim and were able to isolate and identify the ureter which  was retracted upward and was closely adherent to the ovary.  The  infundibulopelvic ligament was able to be separated and crossclamped, cut,  and ligated.  The ovary was then placed on traction and gradually sharply  and bluntly dissected away from the adnexal structures as well as from the  sigmoid.   Eventually we were able to reach the point that the remaining pedicle to the  vaginal cuff could be crossclamped after carefully, repeatedly identifying  the location of the ureter and ensuring that the ureter was well out of the  operative area.  The final crossclamping with a Kelly clamp allowed removal  of the tube and ovary and 2-0 chromic ligation as the pedicle confirmed  hemostasis.  A couple of clips were used on suspected vascular tissues in  the adnexa.   The irrigation of this area showed adequate hemostasis.  The bowel was  repositioned and attention then addressed to the right side, where we found  the right ovary completely smoothly adherent into the right sidewall with  the tip of the ovary attached to the sigmoid colon on the right side of the  loop of large bowel.  The infundibulopelvic ligament could be isolated  similar to the opposite side, crossclamped, and ligated.  The ureter was  well out of harm's way.  The ovary was gradually sharply dissected off of  the attached structures.  The remnants of the round ligament were attached  to the ovary and were clamped, cut, and ligated.  The remaining attachments  to the vaginal cuff were clamped, cut, and suture ligated.  We then proceeded with irrigation and inspection of the pedicles and  confirmation of hemostasis.  Bowel and laparotomy  equipment was removed,  omentum inspected and cauterized as necessary for hemostasis.  The incisions  were closed with continuous double strand of Maxon to close the fascia and  peritoneum in a single continuous fashion with the subcu tissues closed with  2-0 plain and staple closure of the skin completing the procedure.  Estimated blood loss 100 cc.  Tilda Burrow, M.D.    JVF/MEDQ  D:  08/07/2002  T:  08/07/2002  Job:  [id number]

## 2011-03-30 ENCOUNTER — Other Ambulatory Visit: Payer: Self-pay | Admitting: Neurosurgery

## 2011-03-30 ENCOUNTER — Ambulatory Visit
Admission: RE | Admit: 2011-03-30 | Discharge: 2011-03-30 | Disposition: A | Discharge status: Home / Self Care | Payer: BC Managed Care – PPO | Source: Ambulatory Visit | Attending: Neurosurgery | Admitting: Neurosurgery

## 2011-03-30 DIAGNOSIS — R209 Unspecified disturbances of skin sensation: Secondary | ICD-10-CM

## 2011-03-30 DIAGNOSIS — M47812 Spondylosis without myelopathy or radiculopathy, cervical region: Secondary | ICD-10-CM

## 2011-03-30 DIAGNOSIS — M5030 Other cervical disc degeneration, unspecified cervical region: Secondary | ICD-10-CM

## 2011-06-19 ENCOUNTER — Encounter (HOSPITAL_COMMUNITY): Payer: Self-pay

## 2011-06-19 ENCOUNTER — Encounter (HOSPITAL_COMMUNITY)
Admission: RE | Admit: 2011-06-19 | Discharge: 2011-06-19 | Disposition: A | Discharge status: Home / Self Care | Payer: BC Managed Care – PPO | Source: Ambulatory Visit | Attending: Ophthalmology | Admitting: Ophthalmology

## 2011-06-19 ENCOUNTER — Other Ambulatory Visit: Payer: Self-pay

## 2011-06-19 HISTORY — DX: Chronic obstructive pulmonary disease, unspecified: J44.9

## 2011-06-19 HISTORY — DX: Depression, unspecified: F32.A

## 2011-06-19 HISTORY — DX: Major depressive disorder, single episode, unspecified: F32.9

## 2011-06-19 HISTORY — DX: Anxiety disorder, unspecified: F41.9

## 2011-06-19 LAB — CBC
Hemoglobin: 14.7 g/dL (ref 12.0–15.0)
MCH: 36.7 pg — ABNORMAL HIGH (ref 26.0–34.0)
RBC: 4.01 MIL/uL (ref 3.87–5.11)
WBC: 8.2 10*3/uL (ref 4.0–10.5)

## 2011-06-19 LAB — BASIC METABOLIC PANEL
CO2: 39 mEq/L — ABNORMAL HIGH (ref 19–32)
Chloride: 96 mEq/L (ref 96–112)
Glucose, Bld: 117 mg/dL — ABNORMAL HIGH (ref 70–99)
Potassium: 2.8 mEq/L — ABNORMAL LOW (ref 3.5–5.1)
Sodium: 142 mEq/L (ref 135–145)

## 2011-06-19 NOTE — Patient Instructions (Addendum)
20 [name]  06/19/2011   Your procedure is scheduled on:  06-20-2011  Report to [facility] at  830  AM.  Call this number if you have problems the morning of surgery: [phone]   Remember:   Do not eat food:After Midnight.  Do not drink clear liquids: After Midnight.  Take these medicines the morning of surgery with A SIP OF WATER: celexa,valium, nexium.Take albuterol before you come.   Do not wear jewelry, make-up or nail polish.  Do not wear lotions, powders, or perfumes. You may wear deodorant.  Do not shave 48 hours prior to surgery.  Do not bring valuables to the hospital.  Contacts, dentures or bridgework may not be worn into surgery.  Leave suitcase in the car. After surgery it may be brought to your room.  For patients admitted to the hospital, checkout time is 11:00 AM the day of discharge.   Patients discharged the day of surgery will not be allowed to drive home.  Name and phone number of your driver: family  Special Instructions: N/A   Please read over the following fact sheets that you were given: Pain Booklet, Surgical Site Infection Prevention, Anesthesia Post-op Instructions and Care and Recovery After Surgery PATIENT INSTRUCTIONS POST-ANESTHESIA  IMMEDIATELY FOLLOWING SURGERY:  Do not drive or operate machinery for the first twenty four hours after surgery.  Do not make any important decisions for twenty four hours after surgery or while taking narcotic pain medications or sedatives.  If you develop intractable nausea and vomiting or a severe headache please notify your doctor immediately.  FOLLOW-UP:  Please make an appointment with your surgeon as instructed. You do not need to follow up with anesthesia unless specifically instructed to do so.  WOUND CARE INSTRUCTIONS (if applicable):  Keep a dry clean dressing on the anesthesia/puncture wound site if there is drainage.  Once the wound has quit draining you may leave it open to air.  Generally you should leave  the bandage intact for twenty four hours unless there is drainage.  If the epidural site drains for more than 36-48 hours please call the anesthesia department.  QUESTIONS?:  Please feel free to call your physician or the hospital operator if you have any questions, and they will be happy to assist you.     [facility] Anesthesia Department [address] [place] [phone]

## 2011-06-20 ENCOUNTER — Ambulatory Visit (HOSPITAL_COMMUNITY)
Admission: RE | Admit: 2011-06-20 | Discharge: 2011-06-20 | Disposition: A | Discharge status: Home / Self Care | Payer: BC Managed Care – PPO | Source: Ambulatory Visit | Attending: Ophthalmology | Admitting: Ophthalmology

## 2011-06-20 ENCOUNTER — Encounter (HOSPITAL_COMMUNITY)
Admission: RE | Disposition: A | Discharge status: Home / Self Care | Payer: Self-pay | Source: Ambulatory Visit | Attending: Ophthalmology

## 2011-06-20 ENCOUNTER — Encounter (HOSPITAL_COMMUNITY): Payer: Self-pay | Admitting: *Deleted

## 2011-06-20 ENCOUNTER — Ambulatory Visit (HOSPITAL_COMMUNITY): Payer: BC Managed Care – PPO | Admitting: Anesthesiology

## 2011-06-20 ENCOUNTER — Encounter (HOSPITAL_COMMUNITY): Payer: Self-pay | Admitting: Anesthesiology

## 2011-06-20 DIAGNOSIS — J449 Chronic obstructive pulmonary disease, unspecified: Secondary | ICD-10-CM | POA: Insufficient documentation

## 2011-06-20 DIAGNOSIS — Z01810 Encounter for preprocedural cardiovascular examination: Secondary | ICD-10-CM | POA: Insufficient documentation

## 2011-06-20 DIAGNOSIS — J4489 Other specified chronic obstructive pulmonary disease: Secondary | ICD-10-CM | POA: Insufficient documentation

## 2011-06-20 DIAGNOSIS — H2510 Age-related nuclear cataract, unspecified eye: Secondary | ICD-10-CM | POA: Insufficient documentation

## 2011-06-20 DIAGNOSIS — Z01812 Encounter for preprocedural laboratory examination: Secondary | ICD-10-CM | POA: Insufficient documentation

## 2011-06-20 HISTORY — PX: CATARACT EXTRACTION W/PHACO: SHX586

## 2011-06-20 SURGERY — PHACOEMULSIFICATION, CATARACT, WITH IOL INSERTION
Anesthesia: Monitor Anesthesia Care | Site: Eye | Laterality: Left | Wound class: Clean

## 2011-06-20 MED ORDER — TETRACAINE HCL 0.5 % OP SOLN
1.0000 [drp] | OPHTHALMIC | Status: AC
  Administered 2011-06-20 (×2): 1 [drp] via OPHTHALMIC

## 2011-06-20 MED ORDER — KETOROLAC TROMETHAMINE 0.5 % OP SOLN: 1.0000 [drp] | OPHTHALMIC | Status: AC

## 2011-06-20 MED ORDER — TETRACAINE HCL 0.5 % OP SOLN
OPHTHALMIC | Status: AC
  Filled 2011-06-20: qty 2

## 2011-06-20 MED ORDER — CYCLOPENTOLATE-PHENYLEPHRINE 0.2-1 % OP SOLN
OPHTHALMIC | Status: AC
  Filled 2011-06-20: qty 2

## 2011-06-20 MED ORDER — EPINEPHRINE HCL 1 MG/ML IJ SOLN
INTRAMUSCULAR | Status: AC
  Filled 2011-06-20: qty 1

## 2011-06-20 MED ORDER — PHENYLEPHRINE HCL 2.5 % OP SOLN
1.0000 [drp] | OPHTHALMIC | Status: AC
  Administered 2011-06-20 (×2): 1 [drp] via OPHTHALMIC

## 2011-06-20 MED ORDER — CYCLOPENTOLATE-PHENYLEPHRINE 0.2-1 % OP SOLN
1.0000 [drp] | OPHTHALMIC | Status: AC
  Administered 2011-06-20 (×2): 1 [drp] via OPHTHALMIC

## 2011-06-20 MED ORDER — PHENYLEPHRINE HCL 2.5 % OP SOLN
OPHTHALMIC | Status: AC
  Filled 2011-06-20: qty 2

## 2011-06-20 MED ORDER — FLURBIPROFEN SODIUM 0.03 % OP SOLN
OPHTHALMIC | Status: AC
  Filled 2011-06-20: qty 2.5

## 2011-06-20 MED ORDER — MIDAZOLAM HCL 2 MG/2ML IJ SOLN
INTRAMUSCULAR | Status: AC
  Filled 2011-06-20: qty 2

## 2011-06-20 MED ORDER — PROVISC 10 MG/ML IO SOLN
INTRAOCULAR | Status: DC | PRN
  Administered 2011-06-20: 8.5 mg via OPHTHALMIC

## 2011-06-20 MED ORDER — MIDAZOLAM HCL 2 MG/2ML IJ SOLN
1.0000 mg | INTRAMUSCULAR | Status: DC | PRN
  Administered 2011-06-20: 2 mg via INTRAVENOUS

## 2011-06-20 MED ORDER — BSS IO SOLN
INTRAOCULAR | Status: DC | PRN
  Administered 2011-06-20: 15 mL via OPHTHALMIC

## 2011-06-20 MED ORDER — EPINEPHRINE HCL 1 MG/ML IJ SOLN
INTRAOCULAR | Status: DC | PRN
  Administered 2011-06-20: 11:00:00

## 2011-06-20 MED ORDER — FLURBIPROFEN SODIUM 0.03 % OP SOLN
1.0000 [drp] | OPHTHALMIC | Status: AC
  Administered 2011-06-20 (×2): 1 [drp] via OPHTHALMIC

## 2011-06-20 MED ORDER — LACTATED RINGERS IV SOLN
INTRAVENOUS | Status: DC
  Administered 2011-06-20: 10:00:00 via INTRAVENOUS

## 2011-06-20 MED ORDER — LACTATED RINGERS IV SOLN: INTRAVENOUS | Status: DC

## 2011-06-20 SURGICAL SUPPLY — 26 items
CAPSULAR TENSION RING-AMO (OPHTHALMIC RELATED) IMPLANT
CLOTH BEACON ORANGE TIMEOUT ST (SAFETY) ×1 IMPLANT
DUOVISC SYSTEM (INTRAOCULAR LENS)
EYE SHIELD UNIVERSAL CLEAR (GAUZE/BANDAGES/DRESSINGS) ×1 IMPLANT
GLOVE BIO SURGEON STRL SZ 6.5 (GLOVE) ×1 IMPLANT
GLOVE BIOGEL M 6.5 STRL (GLOVE) IMPLANT
GLOVE ECLIPSE 6.5 STRL STRAW (GLOVE) IMPLANT
GLOVE ECLIPSE 7.0 STRL STRAW (GLOVE) IMPLANT
GLOVE EXAM NITRILE LRG STRL (GLOVE) IMPLANT
GLOVE EXAM NITRILE MD LF STRL (GLOVE) ×1 IMPLANT
GLOVE SKINSENSE NS SZ6.5 (GLOVE)
GLOVE SKINSENSE STRL SZ6.5 (GLOVE) IMPLANT
HEALON 5 0.6 ML (INTRAOCULAR LENS) IMPLANT
KIT VITRECTOMY (OPHTHALMIC RELATED) IMPLANT
PAD ARMBOARD 7.5X6 YLW CONV (MISCELLANEOUS) ×1 IMPLANT
PROC W NO LENS (INTRAOCULAR LENS)
PROC W SPEC LENS (INTRAOCULAR LENS)
PROCESS W NO LENS (INTRAOCULAR LENS) IMPLANT
PROCESS W SPEC LENS (INTRAOCULAR LENS) IMPLANT
RING MALYGIN (MISCELLANEOUS) IMPLANT
SIGHTPATH CAT PROC W REG LENS (Ophthalmic Related) ×2 IMPLANT
SYSTEM DUOVISC (INTRAOCULAR LENS) IMPLANT
TAPE SURG TRANSPORE 1 IN (GAUZE/BANDAGES/DRESSINGS) IMPLANT
TAPE SURGICAL TRANSPORE 1 IN (GAUZE/BANDAGES/DRESSINGS) ×1
VISCOELASTIC ADDITIONAL (OPHTHALMIC RELATED) IMPLANT
WATER STERILE IRR 250ML POUR (IV SOLUTION) ×1 IMPLANT

## 2011-06-20 NOTE — OR Nursing (Signed)
Pt.   followup appt. Is 0800 with Eber Jones at Dr. Cathlyn Parsons .

## 2011-06-20 NOTE — Anesthesia Preprocedure Evaluation (Addendum)
Anesthesia Evaluation  Name, MR# and DOB Patient awake  General Assessment Comment  Reviewed: Allergy & Precautions, H&P , NPO status , Patient's Chart, lab work & pertinent test results  History of Anesthesia Complications Negative for: history of anesthetic complications  Airway Mallampati: III TM Distance: <3 FB     Dental  (+) Upper Dentures and Poor Dentition   Pulmonary  COPD  + decreased breath sounds  decreased breath sounds none    Cardiovascular Regular Normal    Neuro/Psych    (+) PSYCHIATRIC DISORDERS, Anxiety, Depression,    GI/Hepatic/Renal        GERD Medicated and Controlled     Endo/Other    Abdominal   Musculoskeletal   Hematology   Peds  Reproductive/Obstetrics    Anesthesia Other Findings             Anesthesia Physical Anesthesia Plan  ASA: III  Anesthesia Plan: MAC   Post-op Pain Management:    Induction: Intravenous  Airway Management Planned: Nasal Cannula  Additional Equipment:   Intra-op Plan:   Post-operative Plan:   Informed Consent: I have reviewed the patients History and Physical, chart, labs and discussed the procedure including the risks, benefits and alternatives for the proposed anesthesia with the patient or authorized representative who has indicated his/her understanding and acceptance.     Plan Discussed with:   Anesthesia Plan Comments:         Anesthesia Quick Evaluation

## 2011-06-20 NOTE — Transfer of Care (Signed)
Immediate Anesthesia Transfer of Care Note  Patient: [name]  Procedure(s) Performed:  CATARACT EXTRACTION PHACO AND INTRAOCULAR LENS PLACEMENT (IOC) - CDE 1.81  Patient Location: PACU and Short Stay  Anesthesia Type: MAC  Level of Consciousness: awake and alert   Airway & Oxygen Therapy: Patient Spontanous Breathing  Post-op Assessment: Report given to PACU RN and Post -op Vital signs reviewed and stable  Post vital signs: stable  Complications: No apparent anesthesia complications

## 2011-06-20 NOTE — Progress Notes (Signed)
No change in H and P 

## 2011-06-20 NOTE — Op Note (Signed)
Patient brought to the operating room and prepped and draped in the usual manner.  Lid speculum inserted in right eye.  Stab incision made at the twelve o'clock position.  Provisc instilled in the anterior chamber.   A 2.4 mm. Stab incision was made temporally.  An anterior capsulotomy was done with a bent 25 gauge needle.  The nucleus was hydrodissected.  The Phaco tip was inserted in the anterior chamber and the nucleus was emulsified.  CDE was 1.81.  The cortical material was then removed with the I and A tip.  Posterior capsule was the polished.  The anterior chamber was deepened with Provisc.  A 30.00 Diopter Rayner 570C IOL was then inserted in the capsular bag.  Provisc was then removed with the I and A tip.  The wound was then hydrated.  Patient sent to the Recovery Room in good condition with follow up in my office.

## 2011-06-20 NOTE — OR Nursing (Signed)
Abnormal labs ,  Istat done potassium 2.5.  Dr. Lorin Picket Lucking notified per verbal order Dr. Jayme Cloud.  Labs Faxed to office.  Pt. To go by [facility] to pick up prescription and to take as directed . To followup with Dr. Tyrone Sage in am.

## 2011-06-20 NOTE — Anesthesia Postprocedure Evaluation (Signed)
  Anesthesia Post-op Note  Patient: [name]  Procedure(s) Performed:  CATARACT EXTRACTION PHACO AND INTRAOCULAR LENS PLACEMENT (IOC) - CDE 1.81  Patient Location: PACU and Short Stay  Anesthesia Type: MAC  Level of Consciousness: awake and alert   Airway and Oxygen Therapy: Patient Spontanous Breathing  Post-op Pain: none  Post-op Assessment: Post-op Vital signs reviewed and Patient's Cardiovascular Status Stable  Post-op Vital Signs: Reviewed  Complications: No apparent anesthesia complications

## 2011-06-20 NOTE — Progress Notes (Signed)
Pt given written and verbal instructions to get rx for potassium supplement upon dsischarge from hospital and begin taking immediately. Has appt with MD tomorrow at 0800. Pt and friend verbalize understanding.

## 2011-06-22 LAB — POCT I-STAT 4, (NA,K, GLUC, HGB,HCT)
Glucose, Bld: 95 mg/dL (ref 70–99)
Potassium: 2.5 mEq/L — CL (ref 3.5–5.1)

## 2011-06-26 ENCOUNTER — Encounter (HOSPITAL_COMMUNITY): Payer: Self-pay | Admitting: Ophthalmology

## 2011-06-29 ENCOUNTER — Encounter (HOSPITAL_COMMUNITY)
Admission: RE | Admit: 2011-06-29 | Discharge: 2011-06-29 | Payer: BC Managed Care – PPO | Source: Ambulatory Visit | Attending: Ophthalmology | Admitting: Ophthalmology

## 2011-06-29 ENCOUNTER — Encounter (HOSPITAL_COMMUNITY): Payer: Self-pay

## 2011-06-29 NOTE — Pre-Procedure Instructions (Signed)
Spoke with pt via telephone about potassium. Pt sts md put her on potassium 20 mEq tid.She is to have K level redrawn today- 06/29/2011 at [facility] will bring a copy of this or have them fax [place] results of potassium recheck. We will redraw potassium if results are not obtained or are still abnormal on arrival. Pt instructed to take nebulizer am of surgery prior to arrival and to bring inhaler with her am of surgery. She verbalized understanding of all information.

## 2011-06-30 LAB — BASIC METABOLIC PANEL
BUN: 3 — ABNORMAL LOW
Creatinine, Ser: 0.8
GFR calc non Af Amer: >60

## 2011-06-30 LAB — CBC
MCV: 97.6
Platelets: 387
WBC: 9.3

## 2011-07-03 NOTE — Pre-Procedure Instructions (Signed)
Received labs from [facility]. Potassium 3.0. Shown to Dr Jayme Cloud. He wants an I-stat on arrival. Ordered.

## 2011-07-04 ENCOUNTER — Encounter (HOSPITAL_COMMUNITY): Payer: Self-pay | Admitting: *Deleted

## 2011-07-04 ENCOUNTER — Ambulatory Visit (HOSPITAL_COMMUNITY): Payer: BC Managed Care – PPO | Admitting: Anesthesiology

## 2011-07-04 ENCOUNTER — Encounter (HOSPITAL_COMMUNITY): Payer: Self-pay | Admitting: Anesthesiology

## 2011-07-04 ENCOUNTER — Encounter (HOSPITAL_COMMUNITY)
Admission: RE | Disposition: A | Discharge status: Home / Self Care | Payer: Self-pay | Source: Ambulatory Visit | Attending: Ophthalmology

## 2011-07-04 ENCOUNTER — Ambulatory Visit (HOSPITAL_COMMUNITY)
Admission: RE | Admit: 2011-07-04 | Discharge: 2011-07-04 | Disposition: A | Discharge status: Home / Self Care | Payer: BC Managed Care – PPO | Source: Ambulatory Visit | Attending: Ophthalmology | Admitting: Ophthalmology

## 2011-07-04 DIAGNOSIS — H2510 Age-related nuclear cataract, unspecified eye: Secondary | ICD-10-CM | POA: Insufficient documentation

## 2011-07-04 DIAGNOSIS — J449 Chronic obstructive pulmonary disease, unspecified: Secondary | ICD-10-CM | POA: Insufficient documentation

## 2011-07-04 DIAGNOSIS — J4489 Other specified chronic obstructive pulmonary disease: Secondary | ICD-10-CM | POA: Insufficient documentation

## 2011-07-04 DIAGNOSIS — Z01812 Encounter for preprocedural laboratory examination: Secondary | ICD-10-CM | POA: Insufficient documentation

## 2011-07-04 HISTORY — PX: CATARACT EXTRACTION W/PHACO: SHX586

## 2011-07-04 LAB — POCT I-STAT 4, (NA,K, GLUC, HGB,HCT)
Glucose, Bld: 124 mg/dL — ABNORMAL HIGH (ref 70–99)
HCT: 55 % — ABNORMAL HIGH (ref 36.0–46.0)
Sodium: 137 mEq/L (ref 135–145)

## 2011-07-04 SURGERY — PHACOEMULSIFICATION, CATARACT, WITH IOL INSERTION
Anesthesia: Monitor Anesthesia Care | Site: Eye | Laterality: Right | Wound class: Clean

## 2011-07-04 MED ORDER — PHENYLEPHRINE HCL 2.5 % OP SOLN
OPHTHALMIC | Status: AC
  Administered 2011-07-04: 1 [drp] via OPHTHALMIC
  Filled 2011-07-04: qty 2

## 2011-07-04 MED ORDER — FLURBIPROFEN SODIUM 0.03 % OP SOLN
1.0000 [drp] | OPHTHALMIC | Status: AC
  Administered 2011-07-04 (×3): 1 [drp] via OPHTHALMIC

## 2011-07-04 MED ORDER — EPINEPHRINE HCL 1 MG/ML IJ SOLN
INTRAOCULAR | Status: DC | PRN
  Administered 2011-07-04: 10:00:00

## 2011-07-04 MED ORDER — KETOROLAC TROMETHAMINE 0.5 % OP SOLN: 1.0000 [drp] | OPHTHALMIC | Status: AC

## 2011-07-04 MED ORDER — CYCLOPENTOLATE-PHENYLEPHRINE 0.2-1 % OP SOLN
1.0000 [drp] | OPHTHALMIC | Status: AC
  Administered 2011-07-04 (×3): 1 [drp] via OPHTHALMIC

## 2011-07-04 MED ORDER — BSS IO SOLN
INTRAOCULAR | Status: DC | PRN
  Administered 2011-07-04: 15 mL via OPHTHALMIC

## 2011-07-04 MED ORDER — MIDAZOLAM HCL 2 MG/2ML IJ SOLN
INTRAMUSCULAR | Status: AC
  Filled 2011-07-04: qty 2

## 2011-07-04 MED ORDER — TETRACAINE HCL 0.5 % OP SOLN
1.0000 [drp] | OPHTHALMIC | Status: AC
  Administered 2011-07-04 (×3): 1 [drp] via OPHTHALMIC

## 2011-07-04 MED ORDER — CYCLOPENTOLATE-PHENYLEPHRINE 0.2-1 % OP SOLN
OPHTHALMIC | Status: AC
  Administered 2011-07-04: 1 [drp] via OPHTHALMIC
  Filled 2011-07-04: qty 2

## 2011-07-04 MED ORDER — PROVISC 10 MG/ML IO SOLN
INTRAOCULAR | Status: DC | PRN
  Administered 2011-07-04: 8.5 mg via OPHTHALMIC

## 2011-07-04 MED ORDER — LACTATED RINGERS IV SOLN
INTRAVENOUS | Status: DC
Start: 2011-07-04 — End: 2011-07-04
  Administered 2011-07-04: 09:00:00 via INTRAVENOUS

## 2011-07-04 MED ORDER — TETRACAINE HCL 0.5 % OP SOLN
OPHTHALMIC | Status: AC
  Administered 2011-07-04: 1 [drp] via OPHTHALMIC
  Filled 2011-07-04: qty 2

## 2011-07-04 MED ORDER — FLURBIPROFEN SODIUM 0.03 % OP SOLN
OPHTHALMIC | Status: AC
  Administered 2011-07-04: 1 [drp] via OPHTHALMIC
  Filled 2011-07-04: qty 2.5

## 2011-07-04 MED ORDER — PHENYLEPHRINE HCL 2.5 % OP SOLN
1.0000 [drp] | OPHTHALMIC | Status: AC
  Administered 2011-07-04 (×3): 1 [drp] via OPHTHALMIC

## 2011-07-04 MED ORDER — EPINEPHRINE HCL 1 MG/ML IJ SOLN
INTRAMUSCULAR | Status: AC
  Filled 2011-07-04: qty 1

## 2011-07-04 MED ORDER — MIDAZOLAM HCL 2 MG/2ML IJ SOLN
1.0000 mg | INTRAMUSCULAR | Status: DC | PRN
  Administered 2011-07-04: 2 mg via INTRAVENOUS

## 2011-07-04 SURGICAL SUPPLY — 25 items
CAPSULAR TENSION RING-AMO (OPHTHALMIC RELATED) IMPLANT
CLOTH BEACON ORANGE TIMEOUT ST (SAFETY) ×1 IMPLANT
DUOVISC SYSTEM (INTRAOCULAR LENS)
EYE SHIELD UNIVERSAL CLEAR (GAUZE/BANDAGES/DRESSINGS) ×1 IMPLANT
GLOVE BIOGEL M 6.5 STRL (GLOVE) ×1 IMPLANT
GLOVE ECLIPSE 6.5 STRL STRAW (GLOVE) IMPLANT
GLOVE ECLIPSE 7.0 STRL STRAW (GLOVE) IMPLANT
GLOVE EXAM NITRILE LRG STRL (GLOVE) ×1 IMPLANT
GLOVE EXAM NITRILE MD LF STRL (GLOVE) IMPLANT
GLOVE SKINSENSE NS SZ6.5 (GLOVE)
GLOVE SKINSENSE STRL SZ6.5 (GLOVE) IMPLANT
HEALON 5 0.6 ML (INTRAOCULAR LENS) IMPLANT
KIT VITRECTOMY (OPHTHALMIC RELATED) IMPLANT
PAD ARMBOARD 7.5X6 YLW CONV (MISCELLANEOUS) ×1 IMPLANT
PROC W NO LENS (INTRAOCULAR LENS)
PROC W SPEC LENS (INTRAOCULAR LENS)
PROCESS W NO LENS (INTRAOCULAR LENS) IMPLANT
PROCESS W SPEC LENS (INTRAOCULAR LENS) IMPLANT
RING MALYGIN (MISCELLANEOUS) IMPLANT
SIGHTPATH CAT PROC W REG LENS (Ophthalmic Related) ×2 IMPLANT
SYSTEM DUOVISC (INTRAOCULAR LENS) IMPLANT
TAPE SURG TRANSPORE 1 IN (GAUZE/BANDAGES/DRESSINGS) IMPLANT
TAPE SURGICAL TRANSPORE 1 IN (GAUZE/BANDAGES/DRESSINGS) ×1
VISCOELASTIC ADDITIONAL (OPHTHALMIC RELATED) IMPLANT
WATER STERILE IRR 250ML POUR (IV SOLUTION) ×1 IMPLANT

## 2011-07-04 NOTE — H&P (Signed)
No change in H and P 

## 2011-07-04 NOTE — Anesthesia Preprocedure Evaluation (Signed)
Anesthesia Evaluation  Name, MR# and DOB Patient awake  General Assessment Comment  Reviewed: Allergy & Precautions, H&P , NPO status , Patient's Chart, lab work & pertinent test results  History of Anesthesia Complications Negative for: history of anesthetic complications  Airway Mallampati: III TM Distance: <3 FB     Dental  (+) Upper Dentures and Poor Dentition   Pulmonary  COPD  + decreased breath sounds  decreased breath sounds none    Cardiovascular Regular Normal    Neuro/Psych    (+) PSYCHIATRIC DISORDERS, Anxiety, Depression,    GI/Hepatic/Renal        GERD Medicated and Controlled     Endo/Other    Abdominal   Musculoskeletal   Hematology   Peds  Reproductive/Obstetrics    Anesthesia Other Findings             Anesthesia Physical Anesthesia Plan  ASA: III  Anesthesia Plan: MAC   Post-op Pain Management:    Induction: Intravenous  Airway Management Planned: Nasal Cannula  Additional Equipment:   Intra-op Plan:   Post-operative Plan:   Informed Consent: I have reviewed the patients History and Physical, chart, labs and discussed the procedure including the risks, benefits and alternatives for the proposed anesthesia with the patient or authorized representative who has indicated his/her understanding and acceptance.     Plan Discussed with:   Anesthesia Plan Comments:         Anesthesia Quick Evaluation  

## 2011-07-04 NOTE — Transfer of Care (Signed)
Immediate Anesthesia Transfer of Care Note  Patient: [name]  Procedure(s) Performed:  CATARACT EXTRACTION PHACO AND INTRAOCULAR LENS PLACEMENT (IOC) - CDE: 1.76  Patient Location: Shortstay  Anesthesia Type: MAC  Level of Consciousness: awake  Airway & Oxygen Therapy: Patient Spontanous Breathing   Post-op Assessment: Report given to PACU RN, Post -op Vital signs reviewed and stable and Patient moving all extremities  Post vital signs: Reviewed and stable  Complications: No apparent anesthesia complications

## 2011-07-04 NOTE — Op Note (Signed)
Patient brought to the operating room and prepped and draped in the usual manner.  Lid speculum inserted in right eye.  Stab incision made at the twelve o'clock position.  Provisc instilled in the anterior chamber.   A 2.4 mm. Stab incision was made temporally.  An anterior capsulotomy was done with a bent 25 gauge needle.  The nucleus was hydrodissected.  The Phaco tip was inserted in the anterior chamber and the nucleus was emulsified.  CDE was 1.76.  The cortical material was then removed with the I and A tip.  Posterior capsule was the polished.  The anterior chamber was deepened with Provisc.  A 29.0 Diopter Rayner 570C IOL was then inserted in the capsular bag.  Provisc was then removed with the I and A tip.  The wound was then hydrated.  Patient sent to the Recovery Room in good condition with follow up in my office.

## 2011-07-04 NOTE — Anesthesia Procedure Notes (Signed)
Procedure Name: MAC Date/Time: 07/04/2011 9:38 AM Performed by: Minerva Areola Pre-anesthesia Checklist: Patient identified, Patient being monitored, Emergency Drugs available, Timeout performed and Suction available Patient Re-evaluated:Patient Re-evaluated prior to inductionOxygen Delivery Method: Nasal Cannula

## 2011-07-04 NOTE — Anesthesia Postprocedure Evaluation (Signed)
  Anesthesia Post-op Note  Patient: [name]  Procedure(s) Performed:  CATARACT EXTRACTION PHACO AND INTRAOCULAR LENS PLACEMENT (IOC) - CDE: 1.76  Patient Location:  Short Stay  Anesthesia Type: MAC  Level of Consciousness: awake  Airway and Oxygen Therapy: Patient Spontanous Breathing  Post-op Pain: none  Post-op Assessment: Post-op Vital signs reviewed, Patient's Cardiovascular Status Stable, Respiratory Function Stable, Patent Airway, No signs of Nausea or vomiting and Pain level controlled  Post-op Vital Signs: Reviewed and stable  Complications: No apparent anesthesia complications

## 2011-07-07 ENCOUNTER — Encounter (HOSPITAL_COMMUNITY): Payer: Self-pay | Admitting: Ophthalmology

## 2011-07-23 ENCOUNTER — Observation Stay (HOSPITAL_COMMUNITY)
Admission: EM | Admit: 2011-07-23 | Discharge: 2011-07-24 | DRG: 463 | Discharge status: Against Medical Advice | Payer: BC Managed Care – PPO | Attending: Family Medicine | Admitting: Family Medicine

## 2011-07-23 ENCOUNTER — Encounter (HOSPITAL_COMMUNITY): Payer: Self-pay | Admitting: *Deleted

## 2011-07-23 ENCOUNTER — Emergency Department (HOSPITAL_COMMUNITY): Payer: BC Managed Care – PPO

## 2011-07-23 ENCOUNTER — Other Ambulatory Visit: Payer: Self-pay

## 2011-07-23 DIAGNOSIS — F329 Major depressive disorder, single episode, unspecified: Secondary | ICD-10-CM | POA: Diagnosis present

## 2011-07-23 DIAGNOSIS — R29898 Other symptoms and signs involving the musculoskeletal system: Secondary | ICD-10-CM | POA: Diagnosis present

## 2011-07-23 DIAGNOSIS — R4182 Altered mental status, unspecified: Principal | ICD-10-CM | POA: Diagnosis present

## 2011-07-23 DIAGNOSIS — R279 Unspecified lack of coordination: Secondary | ICD-10-CM | POA: Diagnosis present

## 2011-07-23 DIAGNOSIS — R4789 Other speech disturbances: Secondary | ICD-10-CM | POA: Diagnosis present

## 2011-07-23 DIAGNOSIS — F3289 Other specified depressive episodes: Secondary | ICD-10-CM | POA: Diagnosis present

## 2011-07-23 DIAGNOSIS — F411 Generalized anxiety disorder: Secondary | ICD-10-CM | POA: Diagnosis present

## 2011-07-23 DIAGNOSIS — J449 Chronic obstructive pulmonary disease, unspecified: Secondary | ICD-10-CM | POA: Diagnosis present

## 2011-07-23 DIAGNOSIS — J4489 Other specified chronic obstructive pulmonary disease: Secondary | ICD-10-CM | POA: Diagnosis present

## 2011-07-23 HISTORY — DX: Multiple sclerosis: G35

## 2011-07-23 LAB — DIFFERENTIAL
Basophils Absolute: 0 10*3/uL (ref 0.0–0.1)
Basophils Relative: 0 % (ref 0–1)
Eosinophils Relative: 0 % (ref 0–5)
Lymphocytes Relative: 22 % (ref 12–46)
Monocytes Absolute: 0.7 10*3/uL (ref 0.1–1.0)
Neutro Abs: 8 10*3/uL — ABNORMAL HIGH (ref 1.7–7.7)

## 2011-07-23 LAB — URINALYSIS, ROUTINE W REFLEX MICROSCOPIC
Glucose, UA: NEGATIVE mg/dL
Hgb urine dipstick: NEGATIVE
Ketones, ur: NEGATIVE mg/dL
Protein, ur: NEGATIVE mg/dL
pH: 7 (ref 5.0–8.0)

## 2011-07-23 LAB — RAPID URINE DRUG SCREEN, HOSP PERFORMED
Amphetamines: NOT DETECTED
Benzodiazepines: POSITIVE — AB
Tetrahydrocannabinol: NOT DETECTED

## 2011-07-23 LAB — CBC
HCT: 35.2 % — ABNORMAL LOW (ref 36.0–46.0)
MCHC: 34.1 g/dL (ref 30.0–36.0)
MCV: 106.3 fL — ABNORMAL HIGH (ref 78.0–100.0)
Platelets: 440 10*3/uL — ABNORMAL HIGH (ref 150–400)
RDW: 12.5 % (ref 11.5–15.5)
WBC: 11.2 10*3/uL — ABNORMAL HIGH (ref 4.0–10.5)

## 2011-07-23 LAB — ETHANOL: Alcohol, Ethyl (B): <11 mg/dL (ref 0–11)

## 2011-07-23 LAB — COMPREHENSIVE METABOLIC PANEL
ALT: 31 U/L (ref 0–35)
AST: 35 U/L (ref 0–37)
Albumin: 2.6 g/dL — ABNORMAL LOW (ref 3.5–5.2)
Alkaline Phosphatase: 115 U/L (ref 39–117)
BUN: 8 mg/dL (ref 6–23)
Chloride: 101 mEq/L (ref 96–112)
Potassium: 3.5 mEq/L (ref 3.5–5.1)
Sodium: 140 mEq/L (ref 135–145)
Total Bilirubin: 0.7 mg/dL (ref 0.3–1.2)
Total Protein: 6 g/dL (ref 6.0–8.3)

## 2011-07-23 LAB — CARDIAC PANEL(CRET KIN+CKTOT+MB+TROPI): Total CK: 252 U/L — ABNORMAL HIGH (ref 7–177)

## 2011-07-23 MED ORDER — SODIUM CHLORIDE 0.9 % IV SOLN
INTRAVENOUS | Status: DC
  Administered 2011-07-23: 20:00:00 via INTRAVENOUS

## 2011-07-23 MED ORDER — OXYCODONE-ACETAMINOPHEN 5-325 MG PO TABS
1.0000 | ORAL_TABLET | Freq: Once | ORAL | Status: AC
  Administered 2011-07-23: 1 via ORAL
  Filled 2011-07-23 (×2): qty 1

## 2011-07-23 NOTE — ED Notes (Signed)
Family states pt was diagnosed with MS in July. The last 3 wks pt has been falling around,  slurred speech,  And extremities shaking x 3wks. Pt has been hallucinating x 1 wk. Pt was seen last night at [facility] and wanted pt to be admitted and pt to have an MRI of brain and pt didn't want to stay over night.

## 2011-07-23 NOTE — ED Provider Notes (Signed)
History     CSN: [id number] Arrival date & time: 07/23/2011  7:17 PM  Chief Complaint  Patient presents with  . Aphasia  . Extremity Weakness    (Consider location/radiation/quality/duration/timing/severity/associated sxs/prior treatment) Patient is a 45 y.o. female presenting with extremity weakness. The history is provided by the patient and the spouse. History Limited By: altered mental status.   Extremity Weakness   patient has had slurred speech generalized weakness and hallucinating last few weeks. She is reportedly been seen twice at [facility] for this including yesterday. They want an MRI, but she would not stay. She has a reported history of MS. She is somewhat confused.   Past Medical History  Diagnosis Date  . Anxiety   . Depression   . COPD (chronic obstructive pulmonary disease)   . MS (multiple sclerosis)     Past Surgical History  Procedure Date  . Abdominal hysterectomy 10 yrs ago    ferguson  . Cesarean section     x3  . Cervical fusion   . Cervical disc surgery     x2  . Tonsillectomy 20 yrs ago  . Cataract extraction w/phaco 06/20/2011    Procedure: CATARACT EXTRACTION PHACO AND INTRAOCULAR LENS PLACEMENT (IOC);  Surgeon: [name] T. Nile Riggs;  Location: [facility];  Service: Ophthalmology;  Laterality: Left;  CDE 1.81  . Cataract extraction w/phaco 07/04/2011    Procedure: CATARACT EXTRACTION PHACO AND INTRAOCULAR LENS PLACEMENT (IOC);  Surgeon: [name] T. Nile Riggs;  Location: [facility];  Service: Ophthalmology;  Laterality: Right;  CDE: 1.76    Family History  Problem Relation Age of Onset  . Anesthesia problems Neg Hx   . Hypotension Neg Hx   . Malignant hyperthermia Neg Hx   . Pseudochol deficiency Neg Hx     History  Substance Use Topics  . Smoking status: Current Everyday Smoker -- 1.0 packs/day for 30 years    Types: Cigarettes  . Smokeless tobacco: Not on file  . Alcohol Use: No    OB History    Grav Para Term Preterm Abortions TAB SAB Ect Mult Living                   Review of Systems  Unable to perform ROS: Mental status change  Musculoskeletal: Positive for extremity weakness.    Allergies  Codeine and Penicillins  Home Medications   Current Outpatient Rx  Name Route Sig Dispense Refill  . ALBUTEROL SULFATE HFA 108 (90 BASE) MCG/ACT IN AERS Inhalation Inhale 2 puffs into the lungs every 6 (six) hours as needed. COPD    . ALBUTEROL SULFATE (2.5 MG/3ML) 0.083% IN NEBU Nebulization Take 2.5 mg by nebulization every 6 (six) hours as needed. For copd     . CITALOPRAM HYDROBROMIDE 40 MG PO TABS Oral Take 40 mg by mouth daily.      Marland Kitchen DIAZEPAM 5 MG PO TABS Oral Take 5 mg by mouth 2 (two) times daily as needed. Sleep/Muscle Relaxer     . ESOMEPRAZOLE MAGNESIUM 20 MG PO CPDR Oral Take 20 mg by mouth daily before breakfast.      . MELOXICAM 7.5 MG PO TABS Oral Take 7.5 mg by mouth 2 (two) times daily.     . OXYCODONE-ACETAMINOPHEN 10-325 MG PO TABS Oral Take 1 tablet by mouth every 6 (six) hours as needed. Pain    . ALBUTEROL (5 MG/ML) CONTINUOUS INHALATION SOLN Nebulization Take by nebulization continuous.      . CYCLOBENZAPRINE HCL 10 MG PO TABS Oral  Take 10 mg by mouth at bedtime.      Marland Kitchen POTASSIUM CHLORIDE CRYS CR 20 MEQ PO TBCR Oral Take 20 mEq by mouth 3 (three) times daily.       BP 96/62  Pulse 100  Temp(Src) 99.1 F (37.3 C) (Oral)  Resp 17  Ht 4\' 9"  (1.448 m)  Wt 130 lb (58.968 kg)  BMI 28.13 kg/m2  SpO2 99%  Physical Exam  Constitutional: She appears well-developed and well-nourished.  HENT:       Slight dysconjugate gaze. EOMI. poor dentition.  Eyes: Pupils are equal, round, and reactive to light.  Neck: Normal range of motion. Neck supple.  Cardiovascular: Normal rate and regular rhythm.   Pulmonary/Chest: Effort normal and breath sounds normal. No respiratory distress.  Abdominal: Soft. Bowel sounds are normal.  Musculoskeletal: Normal range of motion.  Neurological: She is alert.       Patient is somewhat  confused. She states that with her MRI her tennis shoes came out fused together.  Skin: Skin is warm and dry.    ED Course  Procedures (including critical care time)  Labs Reviewed  COMPREHENSIVE METABOLIC PANEL - Abnormal; Notable for the following:    Glucose, Bld 115 (*)    Creatinine, Ser <0.47 (*)    Albumin 2.6 (*)    All other components within normal limits  URINE RAPID DRUG SCREEN (HOSP PERFORMED) - Abnormal; Notable for the following:    Benzodiazepines POSITIVE (*)    All other components within normal limits  CBC - Abnormal; Notable for the following:    WBC 11.2 (*)    RBC 3.31 (*)    HCT 35.2 (*)    MCV 106.3 (*)    MCH 36.3 (*)    Platelets 440 (*)    All other components within normal limits  DIFFERENTIAL - Abnormal; Notable for the following:    Neutro Abs 8.0 (*)    All other components within normal limits  CARDIAC PANEL(CRET KIN+CKTOT+MB+TROPI) - Abnormal; Notable for the following:    Total CK 252 (*)    CK, MB 8.9 (*)    Relative Index 3.5 (*)    All other components within normal limits  URINALYSIS, ROUTINE W REFLEX MICROSCOPIC  ETHANOL   Dg Chest 2 View  07/23/2011  *RADIOLOGY REPORT*  Clinical Data: Altered mental status.  Ataxia with tremors.  CHEST - 2 VIEW  Comparison: Chest radiographs 11/27/2008.  Findings: The heart size and mediastinal contours are normal. The lungs are clear. There is no pleural effusion or pneumothorax. No acute osseous findings are identified.  IMPRESSION: Stable examination.  No active cardiopulmonary process.  Original Report Authenticated By: Gerrianne Scale, M.D.     No diagnosis found.   Date: 07/23/2011  Rate: 101  Rhythm: sinus tachycardia  QRS Axis: normal  Intervals: normal  ST/T Wave abnormalities: nonspecific ST/T changes  Conduction Disutrbances:none  Narrative Interpretation:   Old EKG Reviewed: none available    MDM  The patient was brought in by family for altered mental status. For last 2  weeks she's apparently been falling around having slurred speech and had her extremities shaking. She's been hallucinating for a week. She's reportedly been seen twice at [facility] she was seen there and was reportedly supposed to be admitted for MRI but she left and she did want to stay overnight. She reportedly had a CT that was negative. We have so far been unable to get the records from [facility].  Nonspecific changes on her EKG, but no chest pain, but she is confused. Troponins negative CK-MB was mildly elevated. Head CT is being repeated since we have been unable to get the records. Likely admission.        Juliet Rude. Rubin Payor, MD 07/24/11 0001

## 2011-07-24 ENCOUNTER — Encounter: Payer: Self-pay | Admitting: Family Medicine

## 2011-07-24 ENCOUNTER — Other Ambulatory Visit: Payer: Self-pay

## 2011-07-24 ENCOUNTER — Observation Stay (HOSPITAL_COMMUNITY)
Admission: EM | Admit: 2011-07-24 | Discharge: 2011-07-31 | DRG: 034 | Disposition: A | Discharge status: Home Health | Payer: BC Managed Care – PPO | Attending: Internal Medicine | Admitting: Internal Medicine

## 2011-07-24 ENCOUNTER — Emergency Department (HOSPITAL_COMMUNITY): Payer: BC Managed Care – PPO

## 2011-07-24 ENCOUNTER — Encounter (HOSPITAL_COMMUNITY): Payer: Self-pay | Admitting: *Deleted

## 2011-07-24 DIAGNOSIS — R4789 Other speech disturbances: Secondary | ICD-10-CM | POA: Insufficient documentation

## 2011-07-24 DIAGNOSIS — F19939 Other psychoactive substance use, unspecified with withdrawal, unspecified: Secondary | ICD-10-CM | POA: Diagnosis present

## 2011-07-24 DIAGNOSIS — T424X5A Adverse effect of benzodiazepines, initial encounter: Secondary | ICD-10-CM | POA: Diagnosis present

## 2011-07-24 DIAGNOSIS — Z23 Encounter for immunization: Secondary | ICD-10-CM

## 2011-07-24 DIAGNOSIS — F1310 Sedative, hypnotic or anxiolytic abuse, uncomplicated: Secondary | ICD-10-CM | POA: Diagnosis present

## 2011-07-24 DIAGNOSIS — F1110 Opioid abuse, uncomplicated: Secondary | ICD-10-CM | POA: Diagnosis present

## 2011-07-24 DIAGNOSIS — G253 Myoclonus: Secondary | ICD-10-CM | POA: Diagnosis present

## 2011-07-24 DIAGNOSIS — Z9181 History of falling: Secondary | ICD-10-CM

## 2011-07-24 DIAGNOSIS — J4489 Other specified chronic obstructive pulmonary disease: Secondary | ICD-10-CM | POA: Insufficient documentation

## 2011-07-24 DIAGNOSIS — G9340 Encephalopathy, unspecified: Principal | ICD-10-CM | POA: Diagnosis present

## 2011-07-24 DIAGNOSIS — F19239 Other psychoactive substance dependence with withdrawal, unspecified: Secondary | ICD-10-CM | POA: Diagnosis present

## 2011-07-24 DIAGNOSIS — R4182 Altered mental status, unspecified: Secondary | ICD-10-CM | POA: Insufficient documentation

## 2011-07-24 DIAGNOSIS — R269 Unspecified abnormalities of gait and mobility: Secondary | ICD-10-CM | POA: Diagnosis present

## 2011-07-24 DIAGNOSIS — J449 Chronic obstructive pulmonary disease, unspecified: Secondary | ICD-10-CM | POA: Insufficient documentation

## 2011-07-24 DIAGNOSIS — T40605A Adverse effect of unspecified narcotics, initial encounter: Secondary | ICD-10-CM | POA: Diagnosis present

## 2011-07-24 LAB — HEPATIC FUNCTION PANEL
ALT: 34 U/L (ref 0–35)
AST: 38 U/L — ABNORMAL HIGH (ref 0–37)
Alkaline Phosphatase: 120 U/L — ABNORMAL HIGH (ref 39–117)
Bilirubin, Direct: 0.3 mg/dL (ref 0.0–0.3)
Indirect Bilirubin: 0.4 mg/dL (ref 0.3–0.9)

## 2011-07-24 LAB — TSH: TSH: 1.927 u[IU]/mL (ref 0.350–4.500)

## 2011-07-24 LAB — DIFFERENTIAL
Eosinophils Relative: 0 % (ref 0–5)
Lymphocytes Relative: 25 % (ref 12–46)
Lymphs Abs: 2.6 10*3/uL (ref 0.7–4.0)
Monocytes Absolute: 0.8 10*3/uL (ref 0.1–1.0)
Monocytes Relative: 8 % (ref 3–12)
Neutro Abs: 6.8 10*3/uL (ref 1.7–7.7)

## 2011-07-24 LAB — CBC
HCT: 38 % (ref 36.0–46.0)
Hemoglobin: 12.8 g/dL (ref 12.0–15.0)
MCV: 107 fL — ABNORMAL HIGH (ref 78.0–100.0)
RBC: 3.55 MIL/uL — ABNORMAL LOW (ref 3.87–5.11)
RDW: 12.7 % (ref 11.5–15.5)
WBC: 10.2 10*3/uL (ref 4.0–10.5)

## 2011-07-24 LAB — URINALYSIS, ROUTINE W REFLEX MICROSCOPIC
Glucose, UA: NEGATIVE mg/dL
Hgb urine dipstick: NEGATIVE
Ketones, ur: NEGATIVE mg/dL
Leukocytes, UA: NEGATIVE
Protein, ur: NEGATIVE mg/dL
pH: 6 (ref 5.0–8.0)

## 2011-07-24 LAB — ETHANOL: Alcohol, Ethyl (B): <11 mg/dL (ref 0–11)

## 2011-07-24 LAB — BASIC METABOLIC PANEL
BUN: 7 mg/dL (ref 6–23)
CO2: 30 mEq/L (ref 19–32)
Calcium: 8.8 mg/dL (ref 8.4–10.5)
Chloride: 102 mEq/L (ref 96–112)
Creatinine, Ser: <0.47 mg/dL — ABNORMAL LOW (ref 0.50–1.10)
Glucose, Bld: 115 mg/dL — ABNORMAL HIGH (ref 70–99)

## 2011-07-24 MED ORDER — ACETAMINOPHEN 325 MG PO TABS
650.0000 mg | ORAL_TABLET | Freq: Once | ORAL | Status: AC
  Administered 2011-07-24: 650 mg via ORAL
  Filled 2011-07-24: qty 2

## 2011-07-24 MED ORDER — POTASSIUM CHLORIDE 20 MEQ PO PACK
20.0000 meq | PACK | Freq: Once | ORAL | Status: AC
  Administered 2011-07-24: 20 meq via ORAL
  Filled 2011-07-24: qty 1

## 2011-07-24 MED ORDER — LORAZEPAM 1 MG PO TABS
1.0000 mg | ORAL_TABLET | Freq: Once | ORAL | Status: AC
  Administered 2011-07-24: 1 mg via ORAL
  Filled 2011-07-24: qty 1

## 2011-07-24 MED ORDER — NICOTINE 14 MG/24HR TD PT24
14.0000 mg | MEDICATED_PATCH | Freq: Once | TRANSDERMAL | Status: AC
  Administered 2011-07-24: 14 mg via TRANSDERMAL
  Filled 2011-07-24: qty 1

## 2011-07-24 NOTE — ED Notes (Signed)
Patient provided ice chips per request.

## 2011-07-24 NOTE — Consult Note (Signed)
[id number] 

## 2011-07-24 NOTE — ED Notes (Signed)
Seen last night for same - signed out AMA due to pt refusing MRI.  C/o weakness to bil legs, difficulty ambulating and slurred speech x 2 wks.  Family also states pt is having hallucinations, and shaking with headaches.

## 2011-07-24 NOTE — ED Notes (Signed)
Patient to MRI at this time via stretcher with MRI tech.

## 2011-07-24 NOTE — ED Provider Notes (Addendum)
History     CSN: [id number] Arrival date & time: 07/24/2011  1:18 PM  Chief Complaint  Patient presents with  . Weakness    (Consider location/radiation/quality/duration/timing/severity/associated sxs/prior treatment) HPI:  Level V caveat for altered mental status. Patient was seen in the emergency department yesterday. Admission was recommended. A she left AMA. Husband says she is weak, and her shaking, slurred speech, hallucinating, hands cramping for 2 weeks. He takes Percocet for pain and antidepressant medication. Nothing makes symptoms better or worse. No stiff neck, fever, chills.  Past Medical History  Diagnosis Date  . Anxiety   . Depression   . COPD (chronic obstructive pulmonary disease)   . MS (multiple sclerosis)     Past Surgical History  Procedure Date  . Abdominal hysterectomy 10 yrs ago    ferguson  . Cesarean section     x3  . Cervical fusion   . Cervical disc surgery     x2  . Tonsillectomy 20 yrs ago  . Cataract extraction w/phaco 06/20/2011    Procedure: CATARACT EXTRACTION PHACO AND INTRAOCULAR LENS PLACEMENT (IOC);  Surgeon: [name] T. Nile Riggs;  Location: [facility];  Service: Ophthalmology;  Laterality: Left;  CDE 1.81  . Cataract extraction w/phaco 07/04/2011    Procedure: CATARACT EXTRACTION PHACO AND INTRAOCULAR LENS PLACEMENT (IOC);  Surgeon: [name] T. Nile Riggs;  Location: [facility];  Service: Ophthalmology;  Laterality: Right;  CDE: 1.76    Family History  Problem Relation Age of Onset  . Anesthesia problems Neg Hx   . Hypotension Neg Hx   . Malignant hyperthermia Neg Hx   . Pseudochol deficiency Neg Hx     History  Substance Use Topics  . Smoking status: Current Everyday Smoker -- 1.0 packs/day for 30 years    Types: Cigarettes  . Smokeless tobacco: Not on file  . Alcohol Use: No    OB History    Grav Para Term Preterm Abortions TAB SAB Ect Mult Living                  Review of Systems  Unable to perform ROS: Mental status change     Allergies  Codeine; Latex; and Penicillins  Home Medications   Current Outpatient Rx  Name Route Sig Dispense Refill  . ALBUTEROL SULFATE HFA 108 (90 BASE) MCG/ACT IN AERS Inhalation Inhale 2 puffs into the lungs every 6 (six) hours as needed. COPD    . ALBUTEROL SULFATE (2.5 MG/3ML) 0.083% IN NEBU Nebulization Take 2.5 mg by nebulization every 6 (six) hours as needed. For copd     . ALBUTEROL (5 MG/ML) CONTINUOUS INHALATION SOLN Nebulization Take by nebulization continuous.      Marland Kitchen CITALOPRAM HYDROBROMIDE 40 MG PO TABS Oral Take 40 mg by mouth daily.      . CYCLOBENZAPRINE HCL 10 MG PO TABS Oral Take 10 mg by mouth at bedtime.      Marland Kitchen DIAZEPAM 5 MG PO TABS Oral Take 5 mg by mouth 2 (two) times daily as needed. Sleep/Muscle Relaxer     . ESOMEPRAZOLE MAGNESIUM 20 MG PO CPDR Oral Take 20 mg by mouth daily before breakfast.      . ONE-A-DAY WOMENS PO Oral Take 1 tablet by mouth daily.      . OXYCODONE-ACETAMINOPHEN 10-325 MG PO TABS Oral Take 1 tablet by mouth every 6 (six) hours as needed. Pain    . MELOXICAM 7.5 MG PO TABS Oral Take 7.5 mg by mouth 2 (two) times daily.     Marland Kitchen  POTASSIUM CHLORIDE CRYS CR 20 MEQ PO TBCR Oral Take 20 mEq by mouth 3 (three) times daily.       BP 124/77  Pulse 97  Temp(Src) 98.7 F (37.1 C) (Oral)  Resp 24  Ht 4\' 9"  (1.448 m)  Wt 130 lb (58.968 kg)  BMI 28.13 kg/m2  SpO2 96%  Physical Exam  Nursing note and vitals reviewed. Constitutional: She is oriented to person, place, and time. She appears well-developed and well-nourished.  HENT:  Head: Normocephalic and atraumatic.  Eyes: Conjunctivae and EOM are normal. Pupils are equal, round, and reactive to light.  Neck: Normal range of motion. Neck supple.  Cardiovascular: Normal rate and regular rhythm.   Pulmonary/Chest: Effort normal and breath sounds normal.  Abdominal: Soft. Bowel sounds are normal.  Musculoskeletal: Normal range of motion.  Neurological: She is alert and oriented to person,  place, and time.  Skin: Skin is warm and dry.  Psychiatric:       Flat affect, flight of ideas, appears confused.    ED Course  Procedures (including critical care time)  Date: 07/24/2011  Rate: 102  Rhythm: sinus tachycardia  QRS Axis: normal  Intervals: normal  ST/T Wave abnormalities: Inverted T waves anteriorly him a whole  Conduction Disutrbances:none  Narrative Interpretation:   Old EKG Reviewed: none available  Labs Reviewed  CBC - Abnormal; Notable for the following:    RBC 3.55 (*)    MCV 107.0 (*)    MCH 36.1 (*)    Platelets 495 (*)    All other components within normal limits  BASIC METABOLIC PANEL - Abnormal; Notable for the following:    Potassium 3.0 (*)    Glucose, Bld 115 (*)    Creatinine, Ser <0.47 (*)    All other components within normal limits  URINALYSIS, ROUTINE W REFLEX MICROSCOPIC - Abnormal; Notable for the following:    Specific Gravity, Urine >1.030 (*)    All other components within normal limits  DIFFERENTIAL   Dg Chest 2 View  07/23/2011  *RADIOLOGY REPORT*  Clinical Data: Altered mental status.  Ataxia with tremors.  CHEST - 2 VIEW  Comparison: Chest radiographs 11/27/2008.  Findings: The heart size and mediastinal contours are normal. The lungs are clear. There is no pleural effusion or pneumothorax. No acute osseous findings are identified.  IMPRESSION: Stable examination.  No active cardiopulmonary process.  Original Report Authenticated By: Gerrianne Scale, M.D.   Ct Head Wo Contrast  07/24/2011  *RADIOLOGY REPORT*  Clinical Data: Altered mental status.  CT HEAD WITHOUT CONTRAST  Technique:  Contiguous axial images were obtained from the base of the skull through the vertex without contrast.  Comparison: 08/18/2010  Findings: No acute intracranial abnormality.  Specifically, no hemorrhage, hydrocephalus, mass lesion, acute infarction, or significant intracranial injury.  No acute calvarial abnormality. Visualized paranasal sinuses and  mastoids clear.  Orbital soft tissues unremarkable.  IMPRESSION: No acute intracranial abnormality.  Original Report Authenticated By: Cyndie Chime, M.D.   Mr Brain Wo Contrast  07/24/2011  *RADIOLOGY REPORT*  Clinical Data: Slurred speech and confusion.  Multiple sclerosis.  MRI HEAD WITHOUT CONTRAST  Technique:  Multiplanar, multiecho pulse sequences of the brain and surrounding structures were obtained according to standard protocol without intravenous contrast.  Comparison: CT 07/24/2011  Findings: Image quality degraded by patient motion.  Several small subcortical white matter hyperintensities bilaterally.  These are nonspecific but may be due to chronic microvascular ischemia or migraine headaches.  They are not typical  in location for multiple sclerosis.  Periventricular white matter is intact.  Brainstem and posterior fossa are normal.  Negative for hemorrhage or mass lesion.  Ventricle size is normal. Flattening of the left parietal bone appears chronic and benign.  Negative for acute infarct.  IMPRESSION: Negative for acute abnormality.  Small hyperintensities in the deep white matter bilaterally, possible chronic microvascular ischemia or migraine headaches sequella.  Original Report Authenticated By: Camelia Phenes, M.D.     1. Altered mental status       MDM  MRI shows no acute anomalies. I'm concerned about a potential psychiatric source of her altered mental status. Also could be medication related. Doubt meningitis. Will admit to hospital to try to uncover more information   Addendum at 2200: Could not feel comfortable sending this patient home. I signed commitment papers. Behavioral health to evaluate. Discussed with my colleague. He will followup as soon as behavioral health makes a disposition     Donnetta Hutching, MD 07/24/11 1820  Donnetta Hutching, MD 07/24/11 2213

## 2011-07-24 NOTE — ED Notes (Signed)
Noted bilat. Hands very dry with cracked skin to palms

## 2011-07-24 NOTE — Progress Notes (Unsigned)
  Pt left ama prior to admission.  Was alert and oriented and despite my attempt to get her to stay she refused.  Suspect ams earlier was related to home sedatives.

## 2011-07-24 NOTE — ED Notes (Signed)
Patient returned from MRI at this time.  

## 2011-07-25 ENCOUNTER — Encounter (HOSPITAL_COMMUNITY): Payer: Self-pay | Admitting: *Deleted

## 2011-07-25 DIAGNOSIS — G253 Myoclonus: Secondary | ICD-10-CM | POA: Diagnosis present

## 2011-07-25 DIAGNOSIS — G9340 Encephalopathy, unspecified: Secondary | ICD-10-CM | POA: Diagnosis present

## 2011-07-25 DIAGNOSIS — F19239 Other psychoactive substance dependence with withdrawal, unspecified: Secondary | ICD-10-CM | POA: Diagnosis present

## 2011-07-25 LAB — CBC
HCT: 34.4 % — ABNORMAL LOW (ref 36.0–46.0)
Hemoglobin: 11.9 g/dL — ABNORMAL LOW (ref 12.0–15.0)
MCH: 36.7 pg — ABNORMAL HIGH (ref 26.0–34.0)
MCV: 106.2 fL — ABNORMAL HIGH (ref 78.0–100.0)
RBC: 3.24 MIL/uL — ABNORMAL LOW (ref 3.87–5.11)
WBC: 13.5 10*3/uL — ABNORMAL HIGH (ref 4.0–10.5)

## 2011-07-25 LAB — RAPID URINE DRUG SCREEN, HOSP PERFORMED
Amphetamines: NOT DETECTED
Barbiturates: NOT DETECTED
Benzodiazepines: POSITIVE — AB
Cocaine: NOT DETECTED

## 2011-07-25 LAB — CREATININE, SERUM: GFR calc Af Amer: >90 mL/min (ref >90)

## 2011-07-25 MED ORDER — NICOTINE 14 MG/24HR TD PT24
MEDICATED_PATCH | TRANSDERMAL | Status: AC
  Filled 2011-07-25: qty 1

## 2011-07-25 MED ORDER — LEVALBUTEROL HCL 0.63 MG/3ML IN NEBU: 0.6300 mg | INHALATION_SOLUTION | Freq: Four times a day (QID) | RESPIRATORY_TRACT | Status: DC | PRN

## 2011-07-25 MED ORDER — LORAZEPAM 1 MG PO TABS
1.0000 mg | ORAL_TABLET | Freq: Once | ORAL | Status: AC
Start: 2011-07-25 — End: 2011-07-25
  Administered 2011-07-25: 1 mg via ORAL
  Filled 2011-07-25: qty 1

## 2011-07-25 MED ORDER — BIOTENE DRY MOUTH MT LIQD
Freq: Two times a day (BID) | OROMUCOSAL | Status: DC
  Administered 2011-07-26 – 2011-07-27 (×3): via OROMUCOSAL
  Administered 2011-07-27: 1 via OROMUCOSAL
  Administered 2011-07-28 (×2): via OROMUCOSAL

## 2011-07-25 MED ORDER — POTASSIUM CHLORIDE CRYS ER 20 MEQ PO TBCR: 60.0000 meq | EXTENDED_RELEASE_TABLET | Freq: Once | ORAL | Status: DC

## 2011-07-25 MED ORDER — SODIUM CHLORIDE 0.9 % IJ SOLN
INTRAMUSCULAR | Status: AC
  Filled 2011-07-25: qty 10

## 2011-07-25 MED ORDER — LORAZEPAM 2 MG/ML IJ SOLN
INTRAMUSCULAR | Status: AC
  Administered 2011-07-25: 2 mg
  Filled 2011-07-25: qty 1

## 2011-07-25 MED ORDER — ONDANSETRON HCL 4 MG/2ML IJ SOLN: 4.0000 mg | Freq: Four times a day (QID) | INTRAMUSCULAR | Status: DC | PRN

## 2011-07-25 MED ORDER — PNEUMOCOCCAL VAC POLYVALENT 25 MCG/0.5ML IJ INJ
0.5000 mL | INJECTION | INTRAMUSCULAR | Status: AC
  Filled 2011-07-25: qty 0.5

## 2011-07-25 MED ORDER — ZIPRASIDONE MESYLATE 20 MG IM SOLR: 10.0000 mg | Freq: Once | INTRAMUSCULAR | Status: DC

## 2011-07-25 MED ORDER — DIPHENHYDRAMINE HCL 50 MG/ML IJ SOLN
50.0000 mg | Freq: Once | INTRAMUSCULAR | Status: AC
  Administered 2011-07-25: 50 mg via INTRAMUSCULAR
  Filled 2011-07-25: qty 1

## 2011-07-25 MED ORDER — ENOXAPARIN SODIUM 40 MG/0.4ML ~~LOC~~ SOLN
40.0000 mg | SUBCUTANEOUS | Status: DC
  Administered 2011-07-25 – 2011-07-26 (×2): 40 mg via SUBCUTANEOUS
  Filled 2011-07-25 (×2): qty 0.4

## 2011-07-25 MED ORDER — ACETAMINOPHEN 325 MG PO TABS
650.0000 mg | ORAL_TABLET | Freq: Four times a day (QID) | ORAL | Status: DC | PRN
  Administered 2011-07-27 – 2011-07-30 (×4): 650 mg via ORAL
  Filled 2011-07-25 (×4): qty 2

## 2011-07-25 MED ORDER — ACETAMINOPHEN 650 MG RE SUPP: 650.0000 mg | Freq: Four times a day (QID) | RECTAL | Status: DC | PRN

## 2011-07-25 MED ORDER — SODIUM CHLORIDE 0.9 % IV BOLUS (SEPSIS)
1000.0000 mL | Freq: Once | INTRAVENOUS | Status: AC
  Administered 2011-07-25: 1000 mL via INTRAVENOUS

## 2011-07-25 MED ORDER — POTASSIUM CHLORIDE IN NACL 40-0.9 MEQ/L-% IV SOLN
INTRAVENOUS | Status: DC
  Administered 2011-07-26 – 2011-07-28 (×6): via INTRAVENOUS
  Filled 2011-07-25 (×18): qty 1000

## 2011-07-25 MED ORDER — ZIPRASIDONE MESYLATE 20 MG IM SOLR
INTRAMUSCULAR | Status: AC
  Administered 2011-07-25: 10 mg
  Filled 2011-07-25: qty 20

## 2011-07-25 MED ORDER — INFLUENZA VIRUS VACC SPLIT PF IM SUSP
0.5000 mL | Freq: Once | INTRAMUSCULAR | Status: DC
  Filled 2011-07-25: qty 0.5

## 2011-07-25 MED ORDER — LORAZEPAM 2 MG/ML IJ SOLN
2.0000 mg | Freq: Once | INTRAMUSCULAR | Status: AC
  Administered 2011-07-25: 2 mg via INTRAMUSCULAR
  Filled 2011-07-25: qty 1

## 2011-07-25 MED ORDER — POTASSIUM CHLORIDE IN NACL 40-0.9 MEQ/L-% IV SOLN
INTRAVENOUS | Status: AC
  Filled 2011-07-25: qty 1000

## 2011-07-25 MED ORDER — CHLORHEXIDINE GLUCONATE 0.12 % MT SOLN
15.0000 mL | Freq: Two times a day (BID) | OROMUCOSAL | Status: DC
  Administered 2011-07-26 – 2011-07-29 (×6): 15 mL via OROMUCOSAL
  Filled 2011-07-25 (×7): qty 15

## 2011-07-25 MED ORDER — ONDANSETRON HCL 4 MG PO TABS: 4.0000 mg | ORAL_TABLET | Freq: Four times a day (QID) | ORAL | Status: DC | PRN

## 2011-07-25 MED ORDER — ACETAMINOPHEN 325 MG PO TABS
ORAL_TABLET | ORAL | Status: AC
  Administered 2011-07-25: 325 mg
  Filled 2011-07-25: qty 2

## 2011-07-25 NOTE — ED Notes (Signed)
Sitter sitting with pt; pt anxious at times; reoriented to surroundings frequently by sitter for safety to self

## 2011-07-25 NOTE — ED Notes (Signed)
Patient seems to be asleep, but her limbs continue to move at a sparatic pace. Patient keeps trying to get up has to be redirect to lay down for her safety.

## 2011-07-25 NOTE — ED Notes (Signed)
Assisted to bedside commode; pt mumbling, stating she wants to go home; states, "they told me I was going home".  Explained to pt that the plan was to be admitted to hospital; continues to state she wants to leave, but cooperative; assisted back to stretcher.

## 2011-07-25 NOTE — ED Notes (Signed)
Pt becoming more restless and attempting to get oob. Pt movement is jerky. Breakfast tray offered and refused. Sitter in room with pt.

## 2011-07-25 NOTE — ED Notes (Signed)
Attempt to call report; per floor, RN unavailable and will call back.

## 2011-07-25 NOTE — ED Notes (Signed)
Pt sleeping at this time. Sitter at bsd. nad noted.

## 2011-07-25 NOTE — ED Notes (Signed)
[facility] called and declined pt stating "acuity too high" per [place].   Tele pysch on computer and pt sleeping at this time. Will call back when pt awake.

## 2011-07-25 NOTE — H&P (Signed)
PCP:   Lilyan Punt, MD, MD   Chief Complaint:  Bizarre behavior  HPI: This is a 45 y/o female who recently had cataract surgery at the end of sept.  Family reports that since then she has been increasingly confused, tremulous to the point that she cannot hold things in her hand, and more recently she has started to have hallucinations. Her symptoms have been associated with slurring of speech, and confusion to the point that he speech is incoherent now.  She is unable to participate in history, so history is obtained from medical record and pt's son.  He reports that patient has not had any fever, shortness of breath, cough, vomiting, diarrhea.  She was sent to [facility] ED but has signed out AMA.  She has come back to [facility], and commitment papers have been signed.  She has been referred for admission.  She has had an extensive work up in the ED with CT head, MRI brain, UA, CXR, TSH, all of which have been negative.  She was evaluated by [facility] service and felt that her altered state may have a psych component, but it was felt that she would need to be further worked up from a neuro standpoint to get medical clearance.  Allergies:   Allergies  Allergen Reactions  . Codeine Rash  . Latex Rash  . Penicillins Rash      Past Medical History  Diagnosis Date  . Anxiety   . Depression   . COPD (chronic obstructive pulmonary disease)   . MS (multiple sclerosis)     Past Surgical History  Procedure Date  . Abdominal hysterectomy 10 yrs ago    ferguson  . Cesarean section     x3  . Cervical fusion   . Cervical disc surgery     x2  . Tonsillectomy 20 yrs ago  . Cataract extraction w/phaco 06/20/2011    Procedure: CATARACT EXTRACTION PHACO AND INTRAOCULAR LENS PLACEMENT (IOC);  Surgeon: [name] T. Nile Riggs;  Location: [facility];  Service: Ophthalmology;  Laterality: Left;  CDE 1.81  . Cataract extraction w/phaco 07/04/2011    Procedure: CATARACT EXTRACTION PHACO AND INTRAOCULAR LENS PLACEMENT  (IOC);  Surgeon: [name] T. Nile Riggs;  Location: [facility];  Service: Ophthalmology;  Laterality: Right;  CDE: 1.76    Prior to Admission medications   Medication Sig Start Date End Date Taking? Authorizing Provider  albuterol (PROVENTIL HFA;VENTOLIN HFA) 108 (90 BASE) MCG/ACT inhaler Inhale 2 puffs into the lungs every 6 (six) hours as needed. COPD   Yes Historical Provider, MD  albuterol (PROVENTIL) (2.5 MG/3ML) 0.083% nebulizer solution Take 2.5 mg by nebulization every 6 (six) hours as needed. For copd    Yes Historical Provider, MD  albuterol (PROVENTIL, VENTOLIN) (5 MG/ML) 0.5% NEBU Take by nebulization continuous.     Yes Historical Provider, MD  citalopram (CELEXA) 40 MG tablet Take 40 mg by mouth daily.     Yes Historical Provider, MD  cyclobenzaprine (FLEXERIL) 10 MG tablet Take 10 mg by mouth at bedtime.     Yes Historical Provider, MD  diazepam (VALIUM) 5 MG tablet Take 5 mg by mouth 2 (two) times daily as needed. Sleep/Muscle Relaxer    Yes Historical Provider, MD  esomeprazole (NEXIUM) 20 MG capsule Take 20 mg by mouth daily before breakfast.     Yes Historical Provider, MD  Multiple Vitamins-Calcium (ONE-A-DAY WOMENS PO) Take 1 tablet by mouth daily.     Yes Historical Provider, MD  oxyCODONE-acetaminophen (PERCOCET) 10-325 MG per tablet  Take 1 tablet by mouth every 6 (six) hours as needed. Pain   Yes Historical Provider, MD  meloxicam (MOBIC) 7.5 MG tablet Take 7.5 mg by mouth 2 (two) times daily.     Historical Provider, MD  potassium chloride SA (K-DUR,KLOR-CON) 20 MEQ tablet Take 20 mEq by mouth 3 (three) times daily.     Historical Provider, MD    Social History:  reports that she has been smoking Cigarettes.  She has a 30 pack-year smoking history. She does not have any smokeless tobacco history on file. She reports that she does not drink alcohol or use illicit drugs.  Family History  Problem Relation Age of Onset  . Anesthesia problems Neg Hx   . Hypotension Neg Hx   .  Malignant hyperthermia Neg Hx   . Pseudochol deficiency Neg Hx     Review of Systems:  Unable to assess due to mental status.  Physical Exam: Blood pressure 112/64, pulse 105, temperature 97.6 F (36.4 C), temperature source Oral, resp. rate 20, height 4\' 9"  (1.448 m), weight 54.7 kg (120 lb 9.5 oz), SpO2 92.00%. Constitutional: Vital signs reviewed.  Patient is a well-developed and well-nourished female, she is disoriented, speech is incoherent Head: Normocephalic and atraumatic Ear: TM normal bilaterally Mouth: no erythema or exudates, MMM Eyes: PERRL, EOMI, conjunctivae normal, No scleral icterus.  Neck: Supple, Trachea midline normal ROM, No JVD, mass, thyromegaly, or carotid bruit present.  Cardiovascular: tachycardic, S1 normal, S2 normal, no MRG, pulses symmetric and intact bilaterally Pulmonary/Chest: CTAB, no wheezes, rales, or rhonchi Abdominal: Soft. Non-tender, non-distended, bowel sounds are normal, no masses, organomegaly, or guarding present.  GU: no CVA tenderness Musculoskeletal: No joint deformities, erythema, or stiffness, ROM full and no nontender Hematology: no cervical, inginal, or axillary adenopathy.  Neurological: Disoriented, unable to co-operate with neuro exam.  Moving all extremities spontaneously  Skin: Warm, dry and intact. No rash, cyanosis, or clubbing.  Psychiatric:speech pressured, tangential  Labs on Admission:    Radiological Exams on Admission: Mr Brain Wo Contrast  07/24/2011  *RADIOLOGY REPORT*  Clinical Data: Slurred speech and confusion.  Multiple sclerosis.  MRI HEAD WITHOUT CONTRAST  Technique:  Multiplanar, multiecho pulse sequences of the brain and surrounding structures were obtained according to standard protocol without intravenous contrast.  Comparison: CT 07/24/2011  Findings: Image quality degraded by patient motion.  Several small subcortical white matter hyperintensities bilaterally.  These are nonspecific but may be due to chronic  microvascular ischemia or migraine headaches.  They are not typical in location for multiple sclerosis.  Periventricular white matter is intact.  Brainstem and posterior fossa are normal.  Negative for hemorrhage or mass lesion.  Ventricle size is normal. Flattening of the left parietal bone appears chronic and benign.  Negative for acute infarct.  IMPRESSION: Negative for acute abnormality.  Small hyperintensities in the deep white matter bilaterally, possible chronic microvascular ischemia or migraine headaches sequella.  Original Report Authenticated By: Camelia Phenes, M.D.    Assessment/Plan   1.Encephalopathy acute, unclear  Etiology.  Possible drug withdrawal syndrome, although her tox screen just shows benzo's and reportedly is does not drink alcohol.  The fact that her symptoms have been occuring for a few weeks make withdrawal less likely.  Will order EEG to r/o seizures.  Will also ask for neuro to see.  Will check ABG, ammonia.  Will give Ativan/haldol for agitation, if medically cleared, may need transfer to inpt psych.   2. MS.  Son reports that patient  was diagnosed MS by LP in the past, but was never treated for it, not sure if that is playing a role here, check ESR.   3. Continue IV hydration, monitor on telemetry, replace potassium    Time Spent on Admission: 70 mins  Ovid Witman 07/25/2011, 6:19 PM

## 2011-07-25 NOTE — ED Notes (Signed)
Pt continues to attempt to get out of bed. Speech is rambling in nature. Behavior is agitated and anxious. Pt continues to rub hands and pick at hands. Multiple cuts to hands that are healing. Toilet was offered to pt. Tech Hillery Jacks informed nurse of pt "pulling down pants peeing in bed and pulled pants back  Up." Dr. Clarene Duke made aware of pt behavior.

## 2011-07-25 NOTE — Progress Notes (Signed)
Pt briefly calm after ativan and geodon.  Now attempting to get OOB, picking at herself.  Per ED RN, ED Tech told her that pt "wet the bed" but pt's clothing was dry.  Pt apparently pulled down her pants, wet the bed, then pulled her pants up again.  Will dose IM ativan and IM benadryl.  Will obtain telepsych consult for recommendations regarding medications.  IVC paperwork already completed during previous shift, ACT team to re-eval pt today and work on bed placement.

## 2011-07-25 NOTE — ED Notes (Signed)
Telepsych Dr. Stann Mainland call back in 1 hour.

## 2011-07-25 NOTE — Progress Notes (Signed)
5621 Assumed care/disposition of patient. Patient with confusion, hallucinations. Evaluated by ACT. Papers sent for placement.  0530 Called to the room of patient who has become more agitated. Patient was admitted to the hospital yesterday for altered mental status, hallucinations, myoclonic jerking, bizzare behavior since having cataract surgery on 07/04/11. Prior to surgery she was a normal person. Use of percocet for pain and ativan for anxiety. When seen 07/23/11 advised for admission for medical clearance. Since then MRI and CT have been done, both of which are unremarkable. Labs only significant for hypokalemia. Observation of the patient shows she is actively hallucinating, picking at the air, using her hands as if she were running a loom. She has multiple excoriated areas to her hands and forearms where she has dug with her nails. Unable to see dental state however per nursing teeth are ground down to gumline and gums are erythematous and swollen. Sister advised nursing that grinding and picking at her mouth began after cataract surgery. IVC paperwork was completed due to threat of patient leaving as she did the day before. While a med/psych admission seems likely, facility with med/psych will be hard to place.HEENT: PERRLA, sclera  Injected bilaterally, EOMI, TMs clear. Unable to assess posterior pharynx, dentition, due to resistance and uncooperative behavior. Neck with FROM, no JVD, no thyromegaly, no adenopathy. Chest: rales at the bases otherwise clear. Cor: tachycardic, nornal heart sounds, no murmur, click or rub noted. Abdomen: soft, non tender, no guarding, rebound. Ext: bruising to IV site in left antecubital area. Excoriations and small lacerations to hands and forearms from her fingernails. Strength 5/5 upper and lower extremities.Patient is right handed.  Pulses 2+. Cap refill brisk. Neuro: Is not oriented to person or place. Seems unaware of surroundings. Speech is garbled.Psych: agitated,  combative, hallucinating. 0700 Patient given Geodon 10 mg IM and Ativan 2mg  IV. IVF bolus 1000 cc NS. More awake. Some speech is clear. Answers some questions appropriately. 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 She allowed a limited exam of her mouth. Bottom teeth are worm, discolored, guns swollen.  3086 Bedside report to Dr. Clarene Duke. Will have Behavioral Health-ACT reassess today. [place] [facility] would be most appropriate site for this patient.

## 2011-07-25 NOTE — ED Notes (Signed)
Received call from [facility]. Assessment team felt pt would be served better at a Medical Psych hospital at this time due to unsure of change of LOC related to medical vs psych issuses, Pt placement refused at this psych hospital at this time.

## 2011-07-25 NOTE — ED Notes (Signed)
Pt awake. Drink given to pt. Telepysch consulted again-

## 2011-07-25 NOTE — ED Notes (Signed)
Attempt to call report; receiving RN not ready; will call back per staff.

## 2011-07-25 NOTE — ED Notes (Signed)
Assumed care of pt. Pt restless in bed. Dr. Colon Branch and Dr. Clarene Duke in room with pt at this time. nad noted.

## 2011-07-25 NOTE — ED Notes (Signed)
Pt taken to bathroom to pee. Pt pee'd with assistance of Angelina Hunt NT.

## 2011-07-25 NOTE — ED Notes (Signed)
Pt displaying increasing agitation; more difficult to reorient; can be combative and argument ive at times., MD made aware of changes of assessments; MD to reevaluate.

## 2011-07-25 NOTE — ED Notes (Signed)
Error in charting- ed disposition charted on wrong pt at this time.

## 2011-07-25 NOTE — Progress Notes (Signed)
Pt has been restless despite IM ativan and IM benadryl.  Pt will close her eyes and rest for a short period (<1 hour), then continues restless, agitated, hallucinating.  Pt will continue her picking movements even while sleeping.  Telepsych MD called, case discussed and has eval.  States there is a poss psych component to pt's presentation, but is still concerned regarding poss medical cause for pt's symptoms.  T/C to [facility]. Kerry Hough, case discussed, will admit.

## 2011-07-26 ENCOUNTER — Inpatient Hospital Stay (HOSPITAL_COMMUNITY)
Admit: 2011-07-26 | Discharge: 2011-07-26 | Disposition: A | Discharge status: Home / Self Care | Payer: BC Managed Care – PPO | Attending: Neurology | Admitting: Neurology

## 2011-07-26 LAB — COMPREHENSIVE METABOLIC PANEL WITH GFR
ALT: 31 U/L (ref 0–35)
AST: 35 U/L (ref 0–37)
Albumin: 2.1 g/dL — ABNORMAL LOW (ref 3.5–5.2)
Alkaline Phosphatase: 101 U/L (ref 39–117)
BUN: 5 mg/dL — ABNORMAL LOW (ref 6–23)
CO2: 27 meq/L (ref 19–32)
Calcium: 8 mg/dL — ABNORMAL LOW (ref 8.4–10.5)
Chloride: 108 meq/L (ref 96–112)
Creatinine, Ser: 0.47 mg/dL — ABNORMAL LOW (ref 0.50–1.10)
GFR calc Af Amer: >90 mL/min
GFR calc non Af Amer: >90 mL/min
Glucose, Bld: 89 mg/dL (ref 70–99)
Potassium: 3.3 meq/L — ABNORMAL LOW (ref 3.5–5.1)
Sodium: 145 meq/L (ref 135–145)
Total Bilirubin: 0.5 mg/dL (ref 0.3–1.2)
Total Protein: 5.4 g/dL — ABNORMAL LOW (ref 6.0–8.3)

## 2011-07-26 LAB — CBC
HCT: 33 % — ABNORMAL LOW (ref 36.0–46.0)
Hemoglobin: 11.3 g/dL — ABNORMAL LOW (ref 12.0–15.0)
MCH: 36.7 pg — ABNORMAL HIGH (ref 26.0–34.0)
MCHC: 34.2 g/dL (ref 30.0–36.0)
MCV: 107.1 fL — ABNORMAL HIGH (ref 78.0–100.0)
Platelets: 430 10*3/uL — ABNORMAL HIGH (ref 150–400)
RBC: 3.08 MIL/uL — ABNORMAL LOW (ref 3.87–5.11)
RDW: 12.8 % (ref 11.5–15.5)
WBC: 13.6 10*3/uL — ABNORMAL HIGH (ref 4.0–10.5)

## 2011-07-26 LAB — MAGNESIUM: Magnesium: 1.9 mg/dL (ref 1.5–2.5)

## 2011-07-26 NOTE — Progress Notes (Signed)
Subjective: Patient is awake but totally confused and disoriented. She can follows simple commands  Objective: Weight change: -4.267 kg (-9 lb 6.5 oz)  Constitutional: Vital signs reviewed. Patient is a well-developed and well-nourished female, she is disoriented, speech is incoherent  Neck: Supple, Trachea midline normal ROM, No JVD, mass, thyromegaly, or carotid bruit present.  Cardiovascular: tachycardic, S1 normal, S2 normal, no MRG, pulses symmetric and intact bilaterally  Pulmonary/Chest: CTAB, no wheezes, rales, or rhonchi  Abdominal: Soft. Non-tender, non-distended, bowel sounds are normal, no masses, organomegaly, or guarding present.  Neurological: Disoriented, follows simple commands  Lab Results: BMET    Component Value Date/Time   NA 145 07/26/2011 0500   K 3.3* 07/26/2011 0500   CL 108 07/26/2011 0500   CO2 27 07/26/2011 0500   GLUCOSE 89 07/26/2011 0500   BUN 5* 07/26/2011 0500   CREATININE 0.47* 07/26/2011 0500   CALCIUM 8.0* 07/26/2011 0500   GFRNONAA >90 07/26/2011 0500   GFRAA >90 07/26/2011 0500   CBC    Component Value Date/Time   WBC 13.6* 07/26/2011 0500   RBC 3.08* 07/26/2011 0500   HGB 11.3* 07/26/2011 0500   HCT 33.0* 07/26/2011 0500   PLT 430* 07/26/2011 0500   MCV 107.1* 07/26/2011 0500   MCH 36.7* 07/26/2011 0500   MCHC 34.2 07/26/2011 0500   RDW 12.8 07/26/2011 0500   LYMPHSABS 2.6 07/24/2011 1704   MONOABS 0.8 07/24/2011 1704   EOSABS 0.0 07/24/2011 1704   BASOSABS 0.0 07/24/2011 1704     Micro Results: No results found for this or any previous visit (from the past 240 hour(s)).  Studies/Results: Dg Chest 2 View  07/23/2011  *RADIOLOGY REPORT*  Clinical Data: Altered mental status.  Ataxia with tremors.  CHEST - 2 VIEW  Comparison: Chest radiographs 11/27/2008.  Findings: The heart size and mediastinal contours are normal. The lungs are clear. There is no pleural effusion or pneumothorax. No acute osseous findings are identified.   IMPRESSION: Stable examination.  No active cardiopulmonary process.  Original Report Authenticated By: Gerrianne Scale, M.D.   Ct Head Wo Contrast  07/24/2011  *RADIOLOGY REPORT*  Clinical Data: Altered mental status.  CT HEAD WITHOUT CONTRAST  Technique:  Contiguous axial images were obtained from the base of the skull through the vertex without contrast.  Comparison: 08/18/2010  Findings: No acute intracranial abnormality.  Specifically, no hemorrhage, hydrocephalus, mass lesion, acute infarction, or significant intracranial injury.  No acute calvarial abnormality. Visualized paranasal sinuses and mastoids clear.  Orbital soft tissues unremarkable.  IMPRESSION: No acute intracranial abnormality.  Original Report Authenticated By: Cyndie Chime, M.D.   Mr Brain Wo Contrast  07/24/2011  *RADIOLOGY REPORT*  Clinical Data: Slurred speech and confusion.  Multiple sclerosis.  MRI HEAD WITHOUT CONTRAST  Technique:  Multiplanar, multiecho pulse sequences of the brain and surrounding structures were obtained according to standard protocol without intravenous contrast.  Comparison: CT 07/24/2011  Findings: Image quality degraded by patient motion.  Several small subcortical white matter hyperintensities bilaterally.  These are nonspecific but may be due to chronic microvascular ischemia or migraine headaches.  They are not typical in location for multiple sclerosis.  Periventricular white matter is intact.  Brainstem and posterior fossa are normal.  Negative for hemorrhage or mass lesion.  Ventricle size is normal. Flattening of the left parietal bone appears chronic and benign.  Negative for acute infarct.  IMPRESSION: Negative for acute abnormality.  Small hyperintensities in the deep white matter bilaterally, possible chronic microvascular ischemia  or migraine headaches sequella.  Original Report Authenticated By: Camelia Phenes, M.D.   Medications: Scheduled Meds:   . antiseptic oral rinse   Mouth Rinse  BID  . chlorhexidine  15 mL Mouth/Throat BID  . enoxaparin  40 mg Subcutaneous Q24H  . influenza  inactive virus vaccine  0.5 mL Intramuscular Once  . nicotine  14 mg Transdermal Once  . pneumococcal 23 valent vaccine  0.5 mL Intramuscular Tomorrow-1000  . potassium chloride SA  60 mEq Oral Once  . sodium chloride      . ziprasidone  10 mg Intramuscular Once   Continuous Infusions:   . 0.9 % NaCl with KCl 40 mEq / L 125 mL/hr at 07/26/11 0959   PRN Meds:.acetaminophen, acetaminophen, levalbuterol, ondansetron (ZOFRAN) IV, ondansetron  Assessment/Plan:  Encephalopathy acute (07/25/2011)  patient admitted to the hospital for further evaluation. CT scan as well as MRI was negative. Patient is awake, totally confused and disoriented. Neurology consultation will be obtained to help in the management of this patient. It is less likely to be medication withdrawal because of the prolonged period of time.  Her emergency department note Telepsych consultation was obtained and mentioned it might be psychiatric component to her disorientation.  Hypokalemia We will replace her potassium with oral supplementations  Questionable history of multiple sclerosis Family mentioned that she have multiple sclerosis. patient is not getting any treatment for at. Also the neurology to evaluate   LOS: 2 days   Zakaria Fromer A 07/26/2011, 12:41 PM

## 2011-07-26 NOTE — Consult Note (Signed)
CSW received referral on pt.  Pt is disoriented and unable to participate in CSW assessment at this time. Awaiting neurology consultation results.  CSW will follow up.  Karn Cassis

## 2011-07-27 ENCOUNTER — Inpatient Hospital Stay (HOSPITAL_COMMUNITY): Payer: BC Managed Care – PPO

## 2011-07-27 LAB — BASIC METABOLIC PANEL
CO2: 25 mEq/L (ref 19–32)
Calcium: 8 mg/dL — ABNORMAL LOW (ref 8.4–10.5)
Creatinine, Ser: 0.5 mg/dL (ref 0.50–1.10)
Glucose, Bld: 103 mg/dL — ABNORMAL HIGH (ref 70–99)

## 2011-07-27 LAB — VITAMIN B12: Vitamin B-12: 305 pg/mL (ref 211–911)

## 2011-07-27 LAB — SEDIMENTATION RATE: Sed Rate: 50 mm/hr — ABNORMAL HIGH (ref 0–22)

## 2011-07-27 NOTE — Procedures (Signed)
[name], [name]                ACCOUNT NO.:  [id number]  MEDICAL RECORD NO.:  [id number]  LOCATION:  A319                          FACILITY:  [facility]  PHYSICIAN:  Christian Borgerding A. Gerilyn Pilgrim, M.D. DATE OF BIRTH:  [birth year]  DATE OF PROCEDURE:  07/26/2011 DATE OF DISCHARGE:                             EEG INTERPRETATION   INDICATION FOR STUDY:  This is a 45 year old who presents with altered mentation and confusion.  This study is being done to evaluate for nonconvulsive seizures.  MEDICATIONS:  Celexa, Flexeril, Valium, Nexium, Mobic, multivitamins, Percocet, potassium.  ANALYSIS:  This is a 16-channel recording that is conducted for 20 minutes.  There is a posterior background activity of 6.5-7 Hz maximum. There is beta activity observed in the frontal areas.  There is a lot of myogenic and movement artifacts noted from time-to-time throughout the recording.  Photic stimulation is carried out without abnormal changes in the background activity.  There is no focal or lateralized slowing. There is no epileptiform activity observed.  IMPRESSION:  This is abnormal recording.  There is mild-to-moderate generalized slowing for age indicating a generalized encephalopathy.     [name] A. Gerilyn Pilgrim, M.D.     KAD/MEDQ  D:  07/27/2011  T:  07/27/2011  Job:  [id number]

## 2011-07-27 NOTE — Consult Note (Signed)
Reason for Consult:ams Referring Physician: hospitalist  [name] is an 45 y.o. female.  HPI:  This is a 45 year old white female who presents with a sketchy history. She presents with confusion, hallucination and altered mental status. The onset is between a few days to a couple weeks per the chart. No family members are around to get history. It appears that she has been to [facility] couple for workup of the confusion. She apparently may have left AMA most recently. She has had an extensive evaluation so far which has been mostly unrevealing including head CT scan, MRI and labs. There is a baseline history of anxiety disorder and depression. Tele psychiatry has been involved. They wanted to get a neurological consultation for committing to a psychiatric unit. No fevers or reported.  Past Medical History  Diagnosis Date  . Anxiety   . Depression   . COPD (chronic obstructive pulmonary disease)   . MS (multiple sclerosis)     Past Surgical History  Procedure Date  . Abdominal hysterectomy 10 yrs ago    [place]  . Cesarean section     x3  . Cervical fusion   . Cervical disc surgery     x2  . Tonsillectomy 20 yrs ago  . Cataract extraction w/phaco 06/20/2011    Procedure: CATARACT EXTRACTION PHACO AND INTRAOCULAR LENS PLACEMENT (IOC);  Surgeon: [name] T. Nile Riggs;  Location: [facility];  Service: Ophthalmology;  Laterality: Left;  CDE 1.81  . Cataract extraction w/phaco 07/04/2011    Procedure: CATARACT EXTRACTION PHACO AND INTRAOCULAR LENS PLACEMENT (IOC);  Surgeon: [name] T. Nile Riggs;  Location: [facility];  Service: Ophthalmology;  Laterality: Right;  CDE: 1.76    Family History  Problem Relation Age of Onset  . Anesthesia problems Neg Hx   . Hypotension Neg Hx   . Malignant hyperthermia Neg Hx   . Pseudochol deficiency Neg Hx     Social History:  reports that she has been smoking Cigarettes.  She has a 30 pack-year smoking history. She does not have any smokeless tobacco history on  file. She reports that she does not drink alcohol or use illicit drugs.  Allergies:  Allergies  Allergen Reactions  . Codeine Rash  . Latex Rash  . Penicillins Rash    Medications:  Prior to Admission medications   Medication Sig Start Date End Date Taking? Authorizing Provider  albuterol (PROVENTIL HFA;VENTOLIN HFA) 108 (90 BASE) MCG/ACT inhaler Inhale 2 puffs into the lungs every 6 (six) hours as needed. COPD   Yes Historical Provider, MD  albuterol (PROVENTIL) (2.5 MG/3ML) 0.083% nebulizer solution Take 2.5 mg by nebulization every 6 (six) hours as needed. For copd    Yes Historical Provider, MD  albuterol (PROVENTIL, VENTOLIN) (5 MG/ML) 0.5% NEBU Take by nebulization continuous.     Yes Historical Provider, MD  citalopram (CELEXA) 40 MG tablet Take 40 mg by mouth daily.     Yes Historical Provider, MD  cyclobenzaprine (FLEXERIL) 10 MG tablet Take 10 mg by mouth at bedtime.     Yes Historical Provider, MD  diazepam (VALIUM) 5 MG tablet Take 5 mg by mouth 2 (two) times daily as needed. Sleep/Muscle Relaxer    Yes Historical Provider, MD  esomeprazole (NEXIUM) 20 MG capsule Take 20 mg by mouth daily before breakfast.     Yes Historical Provider, MD  Multiple Vitamins-Calcium (ONE-A-DAY WOMENS PO) Take 1 tablet by mouth daily.     Yes Historical Provider, MD  oxyCODONE-acetaminophen (PERCOCET) 10-325 MG per tablet  Take 1 tablet by mouth every 6 (six) hours as needed. Pain   Yes Historical Provider, MD  meloxicam (MOBIC) 7.5 MG tablet Take 7.5 mg by mouth 2 (two) times daily.     Historical Provider, MD  potassium chloride SA (K-DUR,KLOR-CON) 20 MEQ tablet Take 20 mEq by mouth 3 (three) times daily.     Historical Provider, MD     Scheduled Meds:   . antiseptic oral rinse   Mouth Rinse BID  . chlorhexidine  15 mL Mouth/Throat BID  . influenza  inactive virus vaccine  0.5 mL Intramuscular Once  . pneumococcal 23 valent vaccine  0.5 mL Intramuscular Tomorrow-1000  . potassium chloride  SA  60 mEq Oral Once  . ziprasidone  10 mg Intramuscular Once  . DISCONTD: enoxaparin  40 mg Subcutaneous Q24H   Continuous Infusions:   . 0.9 % NaCl with KCl 40 mEq / L 125 mL/hr at 07/28/11 0428   PRN Meds:.acetaminophen, acetaminophen, levalbuterol, ondansetron (ZOFRAN) IV, ondansetron   Results for orders placed during the hospital encounter of 07/24/11 (from the past 48 hour(s))  CBC     Status: Abnormal   Collection Time   07/25/11  6:41 PM      Component Value Range Comment   WBC 13.5 (*) 4.0 - 10.5 (K/uL)    RBC 3.24 (*) 3.87 - 5.11 (MIL/uL)    Hemoglobin 11.9 (*) 12.0 - 15.0 (g/dL)    HCT 40.9 (*) 81.1 - 46.0 (%)    MCV 106.2 (*) 78.0 - 100.0 (fL)    MCH 36.7 (*) 26.0 - 34.0 (pg)    MCHC 34.6  30.0 - 36.0 (g/dL)    RDW 91.4  78.2 - 95.6 (%)    Platelets 418 (*) 150 - 400 (K/uL)   CREATININE, SERUM     Status: Normal   Collection Time   07/25/11  6:41 PM      Component Value Range Comment   Creatinine, Ser 0.51  0.50 - 1.10 (mg/dL)    GFR calc non Af Amer >90  >90 (mL/min)    GFR calc Af Amer >90  >90 (mL/min)   COMPREHENSIVE METABOLIC PANEL     Status: Abnormal   Collection Time   07/26/11  5:00 AM      Component Value Range Comment   Sodium 145  135 - 145 (mEq/L)    Potassium 3.3 (*) 3.5 - 5.1 (mEq/L)    Chloride 108  96 - 112 (mEq/L)    CO2 27  19 - 32 (mEq/L)    Glucose, Bld 89  70 - 99 (mg/dL)    BUN 5 (*) 6 - 23 (mg/dL)    Creatinine, Ser 2.13 (*) 0.50 - 1.10 (mg/dL)    Calcium 8.0 (*) 8.4 - 10.5 (mg/dL)    Total Protein 5.4 (*) 6.0 - 8.3 (g/dL)    Albumin 2.1 (*) 3.5 - 5.2 (g/dL)    AST 35  0 - 37 (U/L)    ALT 31  0 - 35 (U/L)    Alkaline Phosphatase 101  39 - 117 (U/L)    Total Bilirubin 0.5  0.3 - 1.2 (mg/dL)    GFR calc non Af Amer >90  >90 (mL/min)    GFR calc Af Amer >90  >90 (mL/min)   CBC     Status: Abnormal   Collection Time   07/26/11  5:00 AM      Component Value Range Comment   WBC 13.6 (*) 4.0 - 10.5 (K/uL)  RBC 3.08 (*) 3.87 -  5.11 (MIL/uL)    Hemoglobin 11.3 (*) 12.0 - 15.0 (g/dL)    HCT 16.1 (*) 09.6 - 46.0 (%)    MCV 107.1 (*) 78.0 - 100.0 (fL)    MCH 36.7 (*) 26.0 - 34.0 (pg)    MCHC 34.2  30.0 - 36.0 (g/dL)    RDW 04.5  40.9 - 81.1 (%)    Platelets 430 (*) 150 - 400 (K/uL)   MAGNESIUM     Status: Normal   Collection Time   07/26/11  5:00 AM      Component Value Range Comment   Magnesium 1.9  1.5 - 2.5 (mg/dL)   BASIC METABOLIC PANEL     Status: Abnormal   Collection Time   07/27/11  5:34 AM      Component Value Range Comment   Sodium 139  135 - 145 (mEq/L)    Potassium 3.6  3.5 - 5.1 (mEq/L)    Chloride 105  96 - 112 (mEq/L)    CO2 25  19 - 32 (mEq/L)    Glucose, Bld 103 (*) 70 - 99 (mg/dL)    BUN 3 (*) 6 - 23 (mg/dL)    Creatinine, Ser 9.14  0.50 - 1.10 (mg/dL)    Calcium 8.0 (*) 8.4 - 10.5 (mg/dL)    GFR calc non Af Amer >90  >90 (mL/min)    GFR calc Af Amer >90  >90 (mL/min)     No results found.  Review of Systems  Unable to perform ROS: mental status change   Blood pressure 140/91, pulse 126, temperature 98.5 F (36.9 C), temperature source Oral, resp. rate 20, height 4\' 9"  (1.448 m), weight 54.7 kg (120 lb 9.5 oz), SpO2 93.00%. Physical Exam GENERAL: Somewhat disheveled mildly overweight lady in no acute distress.  HEENT: Supple. Atraumatic normocephalic.   ABDOMEN: soft  EXTREMITIES: No edema   BACK: Normal.  SKIN: Normal by inspection.    MENTAL STATUS: Alert but confused and disoriented. She has inappropriate nonsensical speech. No dysarthria is noted. She does follow commands with prompting. She does have periods of lucid speaks related that she recently had eye surgery and is due to see a neurosurgeon.  CRANIAL NERVES: Pupils are dilated 7 mm equal, round but reactive to light; extra ocular movements are full, there is no significant nystagmus; visual fields - limited but appears full; upper and lower facial muscles are normal in strength and symmetric, there is no  flattening of the nasolabial folds; tongue is midline; uvula is midline; shoulder elevation is normal.  MOTOR: Normal tone, bulk and strength; no pronator drift.  COORDINATION: She has frequent jerking movements and then fidgets. No dysmetria noted. No parkinsonism. There is no rigidity or cogwheeling.   REFLEXES: Deep tendon reflexes are symmetrical and normal. Babinski reflexes are flexor bilaterally.   SENSATION: Normal to light pain.   Urine toxicology screen is positive for benzodiazepines. TSH 1.9. EEG shows mild to moderate generalized slowing indicating a generalized encephalopathy.  Brain MRI scan is reviewed in person and shows diffuse subcortical deep white matter increased signal and flare imaging. There also appears to be somewhat of increasing on involving part of the cortical rewritten of the frontal area. This may be artifactual but it's somewhat concerning.  Assessment/Plan: 1. Acute/subacute altered mental status. There is probably some underlying psychiatric disorder but he clear abnormality an EEG indicates that that she has an encephalopathy. The etiology is unclear.  The differential diagnosis includes inflammatory  conditions, chronic infectious processes, paraneoplastic syndromes and medication effect.  The patient will be set up to have a spinal tap. We will go ahead and hold the Lovenox tonight for the tap to be done tomorrow. The tap was sent for routine testing, cryptococcal antigen, herpes PCR, and oligoclonal bands given the questionable history of multiple sclerosis. Initial blood tests will be obtained for vitamin B12 level, ANA, ESR and RPR.  [name] 07/27/2011, 8:41 AM

## 2011-07-28 ENCOUNTER — Inpatient Hospital Stay (HOSPITAL_COMMUNITY): Payer: BC Managed Care – PPO

## 2011-07-28 LAB — ANA: Anti Nuclear Antibody(ANA): NEGATIVE

## 2011-07-28 LAB — CSF CELL COUNT WITH DIFFERENTIAL: Tube #: 3

## 2011-07-28 MED ORDER — SODIUM CHLORIDE 0.9 % IJ SOLN
INTRAMUSCULAR | Status: AC
  Administered 2011-07-28: 14:00:00
  Filled 2011-07-28: qty 10

## 2011-07-28 MED ORDER — SODIUM CHLORIDE 0.9 % IJ SOLN
INTRAMUSCULAR | Status: AC
  Administered 2011-07-28: 11:00:00
  Filled 2011-07-28: qty 10

## 2011-07-28 MED ORDER — SODIUM CHLORIDE 0.9 % IJ SOLN
INTRAMUSCULAR | Status: AC
  Administered 2011-07-28: 14:00:00
  Filled 2011-07-28: qty 3

## 2011-07-28 NOTE — Progress Notes (Signed)
Subjective: The patient overall is unchanged. She continues to be quite confused although she is awake and alert.  Objective: Vital signs in last 24 hours: Temp:  [98.2 F (36.8 C)-99.2 F (37.3 C)] 99.2 F (37.3 C) (10/19 0655) Pulse Rate:  [108-117] 116  (10/19 0655) Resp:  [18-20] 20  (10/19 0655) BP: (132-150)/(80-93) 150/93 mmHg (10/19 0655) SpO2:  [94 %-96 %] 96 % (10/19 0655)  Intake/Output from previous day: 10/18 0701 - 10/19 0700 In: 2400 [P.O.:900; I.V.:1500] Out: 3102 [Urine:3100; Stool:2] Intake/Output this shift: Total I/O In: -  Out: 550 [Urine:550] Nutritional status: General  PE GENERAL: Somewhat disheveled mildly overweight lady in no acute distress.  HEENT: Supple. Atraumatic normocephalic.  ABDOMEN: soft  EXTREMITIES: No edema  BACK: Normal.  SKIN: Normal by inspection.  MENTAL STATUS: Alert but confused and disoriented. She has inappropriate nonsensical speech. No dysarthria is noted. She does follow commands with prompting. She does have periods of lucid speaks related that she recently had eye surgery and is due to see a neurosurgeon.  CRANIAL NERVES: Pupils are dilated 7 mm equal, round but reactive to light; extra ocular movements are full, there is no significant nystagmus; visual fields - limited but appears full; upper and lower facial muscles are normal in strength and symmetric, there is no flattening of the nasolabial folds; tongue is midline; uvula is midline; shoulder elevation is normal.  MOTOR: Normal tone, bulk and strength; no pronator drift.  COORDINATION: She has frequent jerking movements and then fidgets. No dysmetria noted. No parkinsonism. There is no rigidity or cogwheeling.     Lab Results:  Basename 07/27/11 0534 07/26/11 0500 07/25/11 1841  WBC -- 13.6* 13.5*  HGB -- 11.3* 11.9*  HCT -- 33.0* 34.4*  PLT -- 430* 418*  NA 139 145 --  K 3.6 3.3* --  CL 105 108 --  CO2 25 27 --  GLUCOSE 103* 89 --  BUN 3* 5* --  CREATININE  0.50 0.47* --  CALCIUM 8.0* 8.0* --  LABA1C -- -- --   Lipid Panel No results found for this basename: CHOL,TRIG,HDL,CHOLHDL,VLDL,LDLCALC in the last 72 hours  Studies/Results: No results found.  Medications:  Scheduled Meds:   . antiseptic oral rinse   Mouth Rinse BID  . chlorhexidine  15 mL Mouth/Throat BID  . influenza  inactive virus vaccine  0.5 mL Intramuscular Once  . pneumococcal 23 valent vaccine  0.5 mL Intramuscular Tomorrow-1000  . potassium chloride SA  60 mEq Oral Once  . ziprasidone  10 mg Intramuscular Once  . DISCONTD: enoxaparin  40 mg Subcutaneous Q24H   Continuous Infusions:   . 0.9 % NaCl with KCl 40 mEq / L 125 mL/hr at 07/28/11 0428   PRN Meds:.acetaminophen, acetaminophen, levalbuterol, ondansetron (ZOFRAN) IV, ondansetron   Assessment/Plan: Acute encephalopathy/mental status changes although no clear etiology. Although the patient has underlying psychiatric problems, the abnormal EEG argues for a organic encephalopathy. A spinal tap will be pursued.  Unfortunately, the patient did go down for a spinal tap but this was refused by the patient. The radiologist therefore was unable to do the procedure. We may make an attempt to do this at the bedside tomorrow depending on how things work out. We will therefore continue to hold the Lovenox.    LOS: 4 days   [name]

## 2011-07-28 NOTE — Consult Note (Signed)
Pt evaluated by neurology and EEG shows encephalopathy.  Awaiting further results to determine appropriate CSW involvement.  Per referral, pt has had difficulty getting her benzos.  Will discuss with pt prior to d/c.   Karn Cassis

## 2011-07-28 NOTE — Procedures (Signed)
PreOperative Dx: Altered mental status, encephalopathy Postoperative Dx: Altered mental status, encephalopathy Procedure:   Fluoro guided lumbar puncture Radiologist:  Tyron Russell Anesthesia:  1.5 ml of 1% lidocaine Specimen:  4 ml clear colorless CSF in 4 tubes EBL:   None Complications: None Note:     Due to low OP of 10 ml H2O, very slow CSF flow rate, despite elevation of table 30 degrees; accounts for lower than normal obtained CSF volume

## 2011-07-28 NOTE — Progress Notes (Signed)
This is late entry for a progress note. Patient was seen yesterday at 07/27/2011 by me  Subjective: Patient is awake but totally confused and disoriented. She does have slurred speech she follows simple commands there is no focal neurological weakness apart from the slurred speech.  Objective: Weight change:   Constitutional: Vital signs reviewed. Patient is a well-developed and well-nourished female, she is disoriented, speech is incoherent  Neck: Supple, Trachea midline normal ROM, No JVD, mass, thyromegaly, or carotid bruit present.  Cardiovascular: tachycardic, S1 normal, S2 normal, no MRG, pulses symmetric and intact bilaterally  Pulmonary/Chest: CTAB, no wheezes, rales, or rhonchi  Abdominal: Soft. Non-tender, non-distended, bowel sounds are normal, no masses, organomegaly, or guarding present.  Neurological: Disoriented, follows simple commands  Lab Results: BMET    Component Value Date/Time   NA 139 07/27/2011 0534   K 3.6 07/27/2011 0534   CL 105 07/27/2011 0534   CO2 25 07/27/2011 0534   GLUCOSE 103* 07/27/2011 0534   BUN 3* 07/27/2011 0534   CREATININE 0.50 07/27/2011 0534   CALCIUM 8.0* 07/27/2011 0534   GFRNONAA >90 07/27/2011 0534   GFRAA >90 07/27/2011 0534   CBC    Component Value Date/Time   WBC 13.6* 07/26/2011 0500   RBC 3.08* 07/26/2011 0500   HGB 11.3* 07/26/2011 0500   HCT 33.0* 07/26/2011 0500   PLT 430* 07/26/2011 0500   MCV 107.1* 07/26/2011 0500   MCH 36.7* 07/26/2011 0500   MCHC 34.2 07/26/2011 0500   RDW 12.8 07/26/2011 0500   LYMPHSABS 2.6 07/24/2011 1704   MONOABS 0.8 07/24/2011 1704   EOSABS 0.0 07/24/2011 1704   BASOSABS 0.0 07/24/2011 1704     Micro Results: No results found for this or any previous visit (from the past 240 hour(s)).  Studies/Results: Dg Chest 2 View  07/23/2011  *RADIOLOGY REPORT*  Clinical Data: Altered mental status.  Ataxia with tremors.  CHEST - 2 VIEW  Comparison: Chest radiographs 11/27/2008.  Findings:  The heart size and mediastinal contours are normal. The lungs are clear. There is no pleural effusion or pneumothorax. No acute osseous findings are identified.  IMPRESSION: Stable examination.  No active cardiopulmonary process.  Original Report Authenticated By: Gerrianne Scale, M.D.   Ct Head Wo Contrast  07/24/2011  *RADIOLOGY REPORT*  Clinical Data: Altered mental status.  CT HEAD WITHOUT CONTRAST  Technique:  Contiguous axial images were obtained from the base of the skull through the vertex without contrast.  Comparison: 08/18/2010  Findings: No acute intracranial abnormality.  Specifically, no hemorrhage, hydrocephalus, mass lesion, acute infarction, or significant intracranial injury.  No acute calvarial abnormality. Visualized paranasal sinuses and mastoids clear.  Orbital soft tissues unremarkable.  IMPRESSION: No acute intracranial abnormality.  Original Report Authenticated By: Cyndie Chime, M.D.   Mr Brain Wo Contrast  07/24/2011  *RADIOLOGY REPORT*  Clinical Data: Slurred speech and confusion.  Multiple sclerosis.  MRI HEAD WITHOUT CONTRAST  Technique:  Multiplanar, multiecho pulse sequences of the brain and surrounding structures were obtained according to standard protocol without intravenous contrast.  Comparison: CT 07/24/2011  Findings: Image quality degraded by patient motion.  Several small subcortical white matter hyperintensities bilaterally.  These are nonspecific but may be due to chronic microvascular ischemia or migraine headaches.  They are not typical in location for multiple sclerosis.  Periventricular white matter is intact.  Brainstem and posterior fossa are normal.  Negative for hemorrhage or mass lesion.  Ventricle size is normal. Flattening of the left parietal bone appears  chronic and benign.  Negative for acute infarct.  IMPRESSION: Negative for acute abnormality.  Small hyperintensities in the deep white matter bilaterally, possible chronic microvascular ischemia or  migraine headaches sequella.  Original Report Authenticated By: Camelia Phenes, M.D.   Medications: Scheduled Meds:    . antiseptic oral rinse   Mouth Rinse BID  . chlorhexidine  15 mL Mouth/Throat BID  . influenza  inactive virus vaccine  0.5 mL Intramuscular Once  . sodium chloride      . DISCONTD: potassium chloride SA  60 mEq Oral Once  . DISCONTD: ziprasidone  10 mg Intramuscular Once   Continuous Infusions:    . 0.9 % NaCl with KCl 40 mEq / L 125 mL/hr at 07/28/11 0428   PRN Meds:.acetaminophen, acetaminophen, levalbuterol, ondansetron (ZOFRAN) IV, ondansetron  Assessment/Plan:  Encephalopathy acute (07/25/2011)  patient admitted to the hospital for further evaluation. CT scan as well as MRI was negative. Patient is awake, totally confused and disoriented. Neurology consultation will be obtained to help in the management of this patient. It is less likely to be medication withdrawal because of the prolonged period of time.  Her emergency department note Telepsych consultation was obtained and mentioned it might be psychiatric component to her disorientation.  Hypokalemia We will replace her potassium with oral supplementations  Questionable history of multiple sclerosis Family mentioned that she have multiple sclerosis. patient is not getting any treatment for at. Also the neurology to evaluate   LOS: 4 days   Taelor Moncada A 07/28/2011, 11:25 AM

## 2011-07-28 NOTE — Progress Notes (Signed)
Subjective: Patient is awake and alert she is oriented to time place and person today. As compared to yesterday she was totally disoriented patient seen by neurology LP to be done today  Objective: Weight change:   Constitutional: Vital signs reviewed. Patient is a well-developed and well-nourished female,she is more appropriate today Neck: Supple, Trachea midline normal ROM, No JVD, mass, thyromegaly, or carotid bruit present.  Cardiovascular: tachycardic, S1 normal, S2 normal, no MRG, pulses symmetric and intact bilaterally  Pulmonary/Chest: CTAB, no wheezes, rales, or rhonchi  Abdominal: Soft. Non-tender, non-distended, bowel sounds are normal, no masses, organomegaly, or guarding present.  Neurological: Disoriented, follows simple commands  Lab Results: BMET    Component Value Date/Time   NA 139 07/27/2011 0534   K 3.6 07/27/2011 0534   CL 105 07/27/2011 0534   CO2 25 07/27/2011 0534   GLUCOSE 103* 07/27/2011 0534   BUN 3* 07/27/2011 0534   CREATININE 0.50 07/27/2011 0534   CALCIUM 8.0* 07/27/2011 0534   GFRNONAA >90 07/27/2011 0534   GFRAA >90 07/27/2011 0534   CBC    Component Value Date/Time   WBC 13.6* 07/26/2011 0500   RBC 3.08* 07/26/2011 0500   HGB 11.3* 07/26/2011 0500   HCT 33.0* 07/26/2011 0500   PLT 430* 07/26/2011 0500   MCV 107.1* 07/26/2011 0500   MCH 36.7* 07/26/2011 0500   MCHC 34.2 07/26/2011 0500   RDW 12.8 07/26/2011 0500   LYMPHSABS 2.6 07/24/2011 1704   MONOABS 0.8 07/24/2011 1704   EOSABS 0.0 07/24/2011 1704   BASOSABS 0.0 07/24/2011 1704     Micro Results: No results found for this or any previous visit (from the past 240 hour(s)).  Studies/Results: Dg Chest 2 View  07/23/2011  *RADIOLOGY REPORT*  Clinical Data: Altered mental status.  Ataxia with tremors.  CHEST - 2 VIEW  Comparison: Chest radiographs 11/27/2008.  Findings: The heart size and mediastinal contours are normal. The lungs are clear. There is no pleural effusion or  pneumothorax. No acute osseous findings are identified.  IMPRESSION: Stable examination.  No active cardiopulmonary process.  Original Report Authenticated By: Gerrianne Scale, M.D.   Ct Head Wo Contrast  07/24/2011  *RADIOLOGY REPORT*  Clinical Data: Altered mental status.  CT HEAD WITHOUT CONTRAST  Technique:  Contiguous axial images were obtained from the base of the skull through the vertex without contrast.  Comparison: 08/18/2010  Findings: No acute intracranial abnormality.  Specifically, no hemorrhage, hydrocephalus, mass lesion, acute infarction, or significant intracranial injury.  No acute calvarial abnormality. Visualized paranasal sinuses and mastoids clear.  Orbital soft tissues unremarkable.  IMPRESSION: No acute intracranial abnormality.  Original Report Authenticated By: Cyndie Chime, M.D.   Mr Brain Wo Contrast  07/24/2011  *RADIOLOGY REPORT*  Clinical Data: Slurred speech and confusion.  Multiple sclerosis.  MRI HEAD WITHOUT CONTRAST  Technique:  Multiplanar, multiecho pulse sequences of the brain and surrounding structures were obtained according to standard protocol without intravenous contrast.  Comparison: CT 07/24/2011  Findings: Image quality degraded by patient motion.  Several small subcortical white matter hyperintensities bilaterally.  These are nonspecific but may be due to chronic microvascular ischemia or migraine headaches.  They are not typical in location for multiple sclerosis.  Periventricular white matter is intact.  Brainstem and posterior fossa are normal.  Negative for hemorrhage or mass lesion.  Ventricle size is normal. Flattening of the left parietal bone appears chronic and benign.  Negative for acute infarct.  IMPRESSION: Negative for acute abnormality.  Small hyperintensities  in the deep white matter bilaterally, possible chronic microvascular ischemia or migraine headaches sequella.  Original Report Authenticated By: Camelia Phenes, M.D.    Medications: Scheduled Meds:    . antiseptic oral rinse   Mouth Rinse BID  . chlorhexidine  15 mL Mouth/Throat BID  . influenza  inactive virus vaccine  0.5 mL Intramuscular Once  . sodium chloride      . DISCONTD: potassium chloride SA  60 mEq Oral Once  . DISCONTD: ziprasidone  10 mg Intramuscular Once   Continuous Infusions:    . 0.9 % NaCl with KCl 40 mEq / L 125 mL/hr at 07/28/11 0428   PRN Meds:.acetaminophen, acetaminophen, levalbuterol, ondansetron (ZOFRAN) IV, ondansetron  Assessment/Plan:  Encephalopathy acute (07/25/2011)  EEG showed encephalopathy. Neurology was consulted. Please note that CT scan of the head without contrast and MRI is negative. Neurology is seeing the patient. Her altered mental status, might have a psychiatric component. Family mentioned that she did have a questionable history of multiple sclerosis. LP will be done today. Patient is more clear and alert today.  Hypokalemia Resolved  Questionable history of multiple sclerosis Family mentioned that she have multiple sclerosis. patient is not getting any treatment for at. Also the neurology to evaluate   LOS: 4 days   Daneen Volcy A 07/28/2011, 11:18 AM

## 2011-07-29 LAB — BASIC METABOLIC PANEL
BUN: 4 mg/dL — ABNORMAL LOW (ref 6–23)
CO2: 30 mEq/L (ref 19–32)
Calcium: 8.9 mg/dL (ref 8.4–10.5)
Creatinine, Ser: 0.43 mg/dL — ABNORMAL LOW (ref 0.50–1.10)
Glucose, Bld: 91 mg/dL (ref 70–99)

## 2011-07-29 LAB — HERPES SIMPLEX VIRUS(HSV) DNA BY PCR

## 2011-07-29 MED ORDER — POTASSIUM CHLORIDE CRYS ER 20 MEQ PO TBCR
20.0000 meq | EXTENDED_RELEASE_TABLET | Freq: Every day | ORAL | Status: DC
  Administered 2011-07-30 – 2011-07-31 (×2): 20 meq via ORAL
  Filled 2011-07-29 (×3): qty 1

## 2011-07-29 MED ORDER — POTASSIUM CHLORIDE CRYS ER 20 MEQ PO TBCR
40.0000 meq | EXTENDED_RELEASE_TABLET | Freq: Two times a day (BID) | ORAL | Status: AC
  Administered 2011-07-29 (×2): 40 meq via ORAL
  Filled 2011-07-29: qty 2
  Filled 2011-07-29: qty 1

## 2011-07-29 NOTE — Progress Notes (Signed)
Subjective:  Patient is awake and alert she is oriented to time place and person today. Patient had her LP done yesterday neurology to see. The CSF seems pretty clear no evidence of meningitis or any other type of infection.  Objective: Weight change:   Constitutional: Vital signs reviewed. Patient is a well-developed and well-nourished female,she is more appropriate today Neck: Supple, Trachea midline normal ROM, No JVD, mass, thyromegaly, or carotid bruit present.  Cardiovascular: tachycardic, S1 normal, S2 normal, no MRG, pulses symmetric and intact bilaterally  Pulmonary/Chest: CTAB, no wheezes, rales, or rhonchi  Abdominal: Soft. Non-tender, non-distended, bowel sounds are normal, no masses, organomegaly, or guarding present.  Neurological: AAOX3, follows simple commands  Lab Results: BMET    Component Value Date/Time   NA 143 07/29/2011 0805   K 3.0* 07/29/2011 0805   CL 104 07/29/2011 0805   CO2 30 07/29/2011 0805   GLUCOSE 91 07/29/2011 0805   BUN 4* 07/29/2011 0805   CREATININE 0.43* 07/29/2011 0805   CALCIUM 8.9 07/29/2011 0805   GFRNONAA >90 07/29/2011 0805   GFRAA >90 07/29/2011 0805   CBC    Component Value Date/Time   WBC 13.6* 07/26/2011 0500   RBC 3.08* 07/26/2011 0500   HGB 11.3* 07/26/2011 0500   HCT 33.0* 07/26/2011 0500   PLT 430* 07/26/2011 0500   MCV 107.1* 07/26/2011 0500   MCH 36.7* 07/26/2011 0500   MCHC 34.2 07/26/2011 0500   RDW 12.8 07/26/2011 0500   LYMPHSABS 2.6 07/24/2011 1704   MONOABS 0.8 07/24/2011 1704   EOSABS 0.0 07/24/2011 1704   BASOSABS 0.0 07/24/2011 1704     Micro Results: Recent Results (from the past 240 hour(s))  CSF CULTURE     Status: Normal (Preliminary result)   Collection Time   07/28/11  3:30 PM      Component Value Range Status Comment   Specimen Description CSF   Final    Special Requests NONE   Final    Gram Stain     Final    Value: CYTOSPIN WBC PRESENT,BOTH PMN AND MONONUCLEAR     NO ORGANISMS SEEN   Culture PENDING   Incomplete    Report Status PENDING   Incomplete     Studies/Results: Dg Chest 2 View 08/17/11  IMPRESSION: Stable examination.  No active cardiopulmonary process.   Ct Head Wo Contrast  IMPRESSION: No acute intracranial abnormality.  Mr Brain Wo Contrast 07/24/2011   IMPRESSION: Negative for acute abnormality.  Small hyperintensities in the deep white matter bilaterally, possible chronic microvascular ischemia or migraine headaches sequella.  Medications: Scheduled Meds:    . antiseptic oral rinse   Mouth Rinse BID  . chlorhexidine  15 mL Mouth/Throat BID  . influenza  inactive virus vaccine  0.5 mL Intramuscular Once  . sodium chloride      . sodium chloride      . DISCONTD: potassium chloride SA  60 mEq Oral Once  . DISCONTD: ziprasidone  10 mg Intramuscular Once   Continuous Infusions:    . DISCONTD: 0.9 % NaCl with KCl 40 mEq / L 125 mL/hr at 07/28/11 0428   PRN Meds:.acetaminophen, acetaminophen, levalbuterol, ondansetron (ZOFRAN) IV, ondansetron  Assessment/Plan:  Encephalopathy acute (07/25/2011) EEG showed encephalopathy. Neurology was consulted. Please note that CT scan of the head without contrast and MRI is negative. Neurology is seeing the patient. Her altered mental status is improving. Patient now is alert, awake and oriented x3,  although she still she still slow. LP was done  yesterday which is basically clear. At time of admission telepsych consult was obtained and they did recommend that it might be a psych component to her altered mental status and she might need inpatient psychiatry. Patient is more awake and alert canal wall ask ACT team to evaluate her for further recommendation for her disposition for either inpatient psychiatry versus home  Hypokalemia Will replace probably she will need daily supplementation  Questionable history of multiple sclerosis Family mentioned that she have multiple sclerosis. patient is not getting any  treatment for at. Also the neurology to evaluate   LOS: 5 days   Masashi Snowdon A 07/29/2011, 10:51 AM

## 2011-07-30 LAB — BASIC METABOLIC PANEL
BUN: 5 mg/dL — ABNORMAL LOW (ref 6–23)
Calcium: 8.9 mg/dL (ref 8.4–10.5)
Creatinine, Ser: 0.46 mg/dL — ABNORMAL LOW (ref 0.50–1.10)
GFR calc non Af Amer: >90 mL/min (ref >90)
Glucose, Bld: 93 mg/dL (ref 70–99)
Sodium: 139 mEq/L (ref 135–145)

## 2011-07-30 LAB — CRYPTOCOCCAL ANTIGEN, CSF

## 2011-07-30 MED ORDER — POTASSIUM CHLORIDE CRYS ER 20 MEQ PO TBCR
40.0000 meq | EXTENDED_RELEASE_TABLET | Freq: Once | ORAL | Status: AC
  Administered 2011-07-30: 40 meq via ORAL
  Filled 2011-07-30: qty 2

## 2011-07-30 MED ORDER — SODIUM CHLORIDE 0.9 % IJ SOLN
INTRAMUSCULAR | Status: AC
  Administered 2011-07-30: 08:00:00
  Filled 2011-07-30: qty 10

## 2011-07-30 NOTE — Progress Notes (Signed)
Subject:  She has obvious wekness of her lower extremities, unable to walk steadily with alotoff shaking ,denies any numbness , or back pain she had neck surgery done ,   Objective: Weight change:   Blood pressure 134/86, pulse 104, temperature 98.1 F (36.7 C), temperature source Oral, resp. rate 18, height 4\' 9"  (1.448 m), weight 54.7 kg (120 lb 9.5 oz), SpO2 96.00%.  Constitutional: Vital signs reviewed. Patient is a well-developed and well-nourished female,she is more appropriate today Neck: Supple, Trachea midline normal ROM, No JVD, mass, thyromegaly, or carotid bruit present.  Cardiovascular: tachycardic, S1 normal, S2 normal, no MRG, pulses symmetric and intact bilaterally  Pulmonary/Chest: CTAB, no wheezes, rales, or rhonchi  Abdominal: Soft. Non-tender, non-distended, bowel sounds are normal, no masses, organomegaly, or guarding present.  Neurological: AAOX3, follows simple commands  Lab Results: BMET    Component Value Date/Time   NA 139 07/30/2011 0646   K 3.3* 07/30/2011 0646   CL 103 07/30/2011 0646   CO2 29 07/30/2011 0646   GLUCOSE 93 07/30/2011 0646   BUN 5* 07/30/2011 0646   CREATININE 0.46* 07/30/2011 0646   CALCIUM 8.9 07/30/2011 0646   GFRNONAA >90 07/30/2011 0646   GFRAA >90 07/30/2011 0646   CBC    Component Value Date/Time   WBC 13.6* 07/26/2011 0500   RBC 3.08* 07/26/2011 0500   HGB 11.3* 07/26/2011 0500   HCT 33.0* 07/26/2011 0500   PLT 430* 07/26/2011 0500   MCV 107.1* 07/26/2011 0500   MCH 36.7* 07/26/2011 0500   MCHC 34.2 07/26/2011 0500   RDW 12.8 07/26/2011 0500   LYMPHSABS 2.6 07/24/2011 1704   MONOABS 0.8 07/24/2011 1704   EOSABS 0.0 07/24/2011 1704   BASOSABS 0.0 07/24/2011 1704     Micro Results: Recent Results (from the past 240 hour(s))  CSF CULTURE     Status: Normal (Preliminary result)   Collection Time   07/28/11  3:30 PM      Component Value Range Status Comment   Specimen Description CSF   Final    Special Requests  NONE   Final    Gram Stain     Final    Value: CYTOSPIN WBC PRESENT,BOTH PMN AND MONONUCLEAR     NO ORGANISMS SEEN   Culture NO GROWTH   Final    Report Status PENDING   Incomplete     Studies/Results: Dg Chest 2 View August 04, 2011  IMPRESSION: Stable examination.  No active cardiopulmonary process.   Ct Head Wo Contrast  IMPRESSION: No acute intracranial abnormality.  Mr Brain Wo Contrast 07/24/2011   IMPRESSION: Negative for acute abnormality.  Small hyperintensities in the deep white matter bilaterally, possible chronic microvascular ischemia or migraine headaches sequella.  Medications: Scheduled Meds:    . influenza  inactive virus vaccine  0.5 mL Intramuscular Once  . potassium chloride  20 mEq Oral Daily  . potassium chloride  40 mEq Oral BID  . sodium chloride      . DISCONTD: antiseptic oral rinse   Mouth Rinse BID  . DISCONTD: chlorhexidine  15 mL Mouth/Throat BID   Continuous Infusions:   PRN Meds:.acetaminophen, acetaminophen, levalbuterol, ondansetron (ZOFRAN) IV, ondansetron  Assessment/Plan:  Encephalopathy acute (07/25/2011) resolved. Neurology was consulted. Please note that CT scan of the head without contrast and MRI is negative. Neurology is seeing the patient. Her altered mental status is improving. Patient now is alert, awake and oriented x3,. LP was done yesterday which is basically clear. Has history of frequent fall, will proceed  with MRI  ofumbar spine, neuro to clear the pt. Pt denies any suicidal ideation ,will discuss with ACT TEAM , PT and OT  evaluate , pt admitted she was evaluated in the recent days for MS, has obvious weakness. At time of admission telepsych consult was obtained and they did recommend that it might be a psych component to her altered mental status and she might need inpatient psychiatry. Patient is more awake and alert canal wall ask ACT team to evaluate her for further recommendation for her disposition for either inpatient  psychiatry versus home  Hypokalemia Will replace probably she will need daily supplementation  Questionable history of multiple sclerosis Family mentioned that she have multiple sclerosis. patient is not getting any treatment for at. Also the neurology to evaluate   LOS: 6 days   [name] I. 07/30/2011, 9:20 AM

## 2011-07-31 ENCOUNTER — Other Ambulatory Visit (HOSPITAL_COMMUNITY): Payer: BC Managed Care – PPO

## 2011-07-31 ENCOUNTER — Inpatient Hospital Stay (HOSPITAL_COMMUNITY): Payer: BC Managed Care – PPO

## 2011-07-31 LAB — BASIC METABOLIC PANEL
Calcium: 9.3 mg/dL (ref 8.4–10.5)
Creatinine, Ser: 0.5 mg/dL (ref 0.50–1.10)
GFR calc non Af Amer: >90 mL/min (ref >90)
Sodium: 139 mEq/L (ref 135–145)

## 2011-07-31 LAB — AMMONIA: Ammonia: <10 umol/L — ABNORMAL LOW (ref 11–60)

## 2011-07-31 LAB — CBC
MCH: 35.3 pg — ABNORMAL HIGH (ref 26.0–34.0)
MCHC: 33.7 g/dL (ref 30.0–36.0)
MCV: 105 fL — ABNORMAL HIGH (ref 78.0–100.0)
Platelets: 621 10*3/uL — ABNORMAL HIGH (ref 150–400)
RDW: 12.8 % (ref 11.5–15.5)

## 2011-07-31 LAB — BLOOD GAS, ARTERIAL
Acid-Base Excess: 2.7 mmol/L — ABNORMAL HIGH (ref 0.0–2.0)
O2 Saturation: 97.6 %
pH, Arterial: 7.483 — ABNORMAL HIGH (ref 7.350–7.400)

## 2011-07-31 MED ORDER — THERA M PLUS PO TABS
1.0000 | ORAL_TABLET | Freq: Every day | ORAL | Status: DC
  Administered 2011-07-31: 1 via ORAL
  Filled 2011-07-31: qty 1

## 2011-07-31 MED ORDER — PANTOPRAZOLE SODIUM 40 MG PO TBEC
40.0000 mg | DELAYED_RELEASE_TABLET | Freq: Every day | ORAL | Status: DC
  Administered 2011-07-31: 40 mg via ORAL
  Filled 2011-07-31: qty 1

## 2011-07-31 MED ORDER — ONDANSETRON HCL 4 MG/2ML IJ SOLN: 4.0000 mg | Freq: Once | INTRAMUSCULAR | Status: DC | PRN

## 2011-07-31 MED ORDER — HALOPERIDOL LACTATE 5 MG/ML IJ SOLN: 5.0000 mg | Freq: Four times a day (QID) | INTRAMUSCULAR | Status: DC | PRN

## 2011-07-31 MED ORDER — OXYCODONE HCL 5 MG PO TABS
5.0000 mg | ORAL_TABLET | Freq: Four times a day (QID) | ORAL | Status: DC | PRN
  Administered 2011-07-31: 5 mg via ORAL
  Filled 2011-07-31: qty 1

## 2011-07-31 MED ORDER — FENTANYL CITRATE 0.05 MG/ML IJ SOLN: 25.0000 ug | INTRAMUSCULAR | Status: DC | PRN

## 2011-07-31 MED ORDER — MELOXICAM 7.5 MG PO TABS
7.5000 mg | ORAL_TABLET | Freq: Two times a day (BID) | ORAL | Status: DC
  Administered 2011-07-31: 7.5 mg via ORAL
  Filled 2011-07-31 (×5): qty 1

## 2011-07-31 MED ORDER — LORAZEPAM 2 MG/ML IJ SOLN: 1.0000 mg | Freq: Four times a day (QID) | INTRAMUSCULAR | Status: DC | PRN

## 2011-07-31 MED ORDER — OXYCODONE-ACETAMINOPHEN 5-325 MG PO TABS: 1.0000 | ORAL_TABLET | Freq: Four times a day (QID) | ORAL | Status: DC | PRN

## 2011-07-31 MED ORDER — OXYCODONE-ACETAMINOPHEN 10-325 MG PO TABS: 1.0000 | ORAL_TABLET | Freq: Four times a day (QID) | ORAL | Status: DC | PRN

## 2011-07-31 MED ORDER — DIAZEPAM 5 MG PO TABS: 5.0000 mg | ORAL_TABLET | Freq: Every evening | ORAL | Status: DC | PRN

## 2011-07-31 NOTE — Discharge Summary (Signed)
Physician Discharge Summary  Patient ID: [name] MRN: [id number] DOB/AGE: 03-30-1966 45 y.o.  Admit date: 07/24/2011 Discharge date: 07/31/2011   Discharge Diagnoses:  #1  Alter mental status resolved" not very clear it could be secondary to medications. 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 #2  extremity weakness with ataxic gait this is discussed with neurologist and there is no clear evidence that patient has multiple sclerosis.  The patient was advised to follow with her neurologist as an outpatient patient already has an appointment with Dr. Teola Bradley for further assessing her frequent falls and lower extremity weakness and ataxic gait . 3- frequent falls likely secondary to ataxic gait 4- benzoyl and   narcotic abuse.   Discharged Condition: patient currently awake alert oriented x3 but patient obviously , unsteady on her gait. Patient currently refused to stay longer for further evaluation and as I mentioned I discussed this case with  neurologist and he  Agreed with discharging the patient and follow up with her neurologist as outpatient, patient is high risk of fall .  Hospital Course:This is a 45 y/o female who recently had cataract surgery at the end of sept. Family reports that since then she has been increasingly confused, tremulous to the point that she cannot hold things in her hand, and more recently she has started to have hallucinations. Her symptoms have been associated with slurring of speech, and confusion to the point that he speech is incoherent now. She is unable to participate in history, so history is obtained from medical record and pt's son. He reports that patient has not had any fever, shortness of breath, cough, vomiting, diarrhea. She was sent to [facility] ED but has signed out AMA. She has come back to [facility], and commitment papers have been signed. She has been referred for admission. She has had an extensive work up in  the ED with CT head, MRI brain, UA, CXR, TSH, all of which have been negative. She was evaluated by [facility] service and felt that her altered state may have a psych component, but it was felt that she would need to be further worked up from a neuro standpoint to get medical clearance.     Consults: neurology consulted done by dr Harrel Carina  Significant Diagnostic Studies: Lab Results:  BMET    Component  Value  Date/Time    NA  143  07/29/2011 0805    K  3.0*  07/29/2011 0805    CL  104  07/29/2011 0805    CO2  30  07/29/2011 0805    GLUCOSE  91  07/29/2011 0805    BUN  4*  07/29/2011 0805    CREATININE  0.43*  07/29/2011 0805    CALCIUM  8.9  07/29/2011 0805    GFRNONAA  >90  07/29/2011 0805    GFRAA  >90  07/29/2011 0805    CBC    Component  Value  Date/Time    WBC  13.6*  07/26/2011 0500    RBC  3.08*  07/26/2011 0500    HGB  11.3*  07/26/2011 0500    HCT  33.0*  07/26/2011 0500    PLT  430*  07/26/2011 0500    MCV  107.1*  07/26/2011 0500    MCH  36.7*  07/26/2011 0500    MCHC  34.2  07/26/2011 0500    RDW  12.8  07/26/2011 0500    LYMPHSABS  2.6  07/24/2011 1704    MONOABS  0.8  07/24/2011 1704    EOSABS  0.0  07/24/2011 1704    BASOSABS  0.0  07/24/2011 1704    Micro Results:  Recent Results (from the past 240 hour(s))   CSF CULTURE Status: Normal (Preliminary result)    Collection Time    07/28/11 3:30 PM   Component  Value  Range  Status  Comment    Specimen Description  CSF   Final     Special Requests  NONE   Final     Gram Stain    Final     Value:  CYTOSPIN WBC PRESENT,BOTH PMN AND MONONUCLEAR     NO ORGANISMS SEEN    Culture  PENDING   Incomplete     Report Status  PENDING   Incomplete     Studies/Results:  Dg Chest 2 View 07/23/2011  IMPRESSION: Stable examination. No active cardiopulmonary process.  Ct Head Wo Contrast  IMPRESSION: No acute intracranial abnormality.  Mr Brain Wo Contrast 07/24/2011  IMPRESSION: Negative for acute abnormality.  Small hyperintensities in the deep white matter bilaterally, possible chronic microvascular ischemia or migraine headaches sequella.  discharge Exam: Blood pressure 143/87, pulse 104, temperature 97.7 F (36.5 C), temperature source Oral, resp. rate 18, height 4\' 9"  (1.448 m), weight 54.7 kg (120 lb 9.5 oz), SpO2 98.00%. Constitutional: Vital signs reviewed. Patient is a well-developed and well-nourished female,she is more appropriate today  Neck: Supple, Trachea midline normal ROM, No JVD, mass, thyromegaly, or carotid bruit present.  Cardiovascular: tachycardic, S1 normal, S2 normal, no MRG, pulses symmetric and intact bilaterally  Pulmonary/Chest: CTAB, no wheezes, rales, or rhonchi  Abdominal: Soft. Non-tender, non-distended, bowel sounds are normal, no masses, organomegaly, or guarding present.  Neurological: AAOX3, she has bilateral ataxic gait, no nuero deficit     Disposition:  Discharge Orders    Future Orders Please Complete By Expires   Diet - low sodium heart healthy      Increase activity slowly        Current Discharge Medication List    CONTINUE these medications which have CHANGED   Details  diazepam (VALIUM) 5 MG tablet Take 1 tablet (5 mg total) by mouth at bedtime as needed for anxiety. Sleep/Muscle Relaxer Qty: 10 tablet, Refills: 0      CONTINUE these medications which have NOT CHANGED   Details  albuterol (PROVENTIL HFA;VENTOLIN HFA) 108 (90 BASE) MCG/ACT inhaler Inhale 2 puffs into the lungs every 6 (six) hours as needed. COPD    albuterol (PROVENTIL) (2.5 MG/3ML) 0.083% nebulizer solution Take 2.5 mg by nebulization every 6 (six) hours as needed. For copd     albuterol (PROVENTIL, VENTOLIN) (5 MG/ML) 0.5% NEBU Take by nebulization continuous.      citalopram (CELEXA) 40 MG tablet Take 40 mg by mouth daily.      cyclobenzaprine (FLEXERIL) 10 MG tablet Take 10 mg by mouth at bedtime.      esomeprazole (NEXIUM) 20 MG capsule Take 20 mg by mouth daily  before breakfast.      Multiple Vitamins-Calcium (ONE-A-DAY WOMENS PO) Take 1 tablet by mouth daily.      meloxicam (MOBIC) 7.5 MG tablet Take 7.5 mg by mouth 2 (two) times daily.     potassium chloride SA (K-DUR,KLOR-CON) 20 MEQ tablet Take 20 mEq by mouth 3 (three) times daily.       STOP taking these medications     oxyCODONE-acetaminophen (PERCOCET) 10-325 MG per tablet  Follow-up Information    Follow up with Lilyan Punt, MD. Make an appointment in 7 days. (neurology follow up at appointemnt)    Contact information:   [address] [place] [address] [phone]          Signed: Ebony Cargo I. 07/31/2011, 1:10 PM

## 2011-07-31 NOTE — Progress Notes (Signed)
Physical Therapy Evaluation Patient Details Name: [name] MRN: [id number] DOB: 1966-05-25 Today's Date: 07/31/2011  Problem List:  Patient Active Problem List  Diagnoses  . DIZZINESS  . DYSPNEA  . CHEST PAIN UNSPECIFIED  . Encephalopathy acute  . Myoclonic jerking  . Drug withdrawal    Past Medical History:  Past Medical History  Diagnosis Date  . Anxiety   . Depression   . COPD (chronic obstructive pulmonary disease)   . MS (multiple sclerosis)    Past Surgical History:  Past Surgical History  Procedure Date  . Abdominal hysterectomy 10 yrs ago    [place]  . Cesarean section     x3  . Cervical fusion   . Cervical disc surgery     x2  . Tonsillectomy 20 yrs ago  . Cataract extraction w/phaco 06/20/2011    Procedure: CATARACT EXTRACTION PHACO AND INTRAOCULAR LENS PLACEMENT (IOC);  Surgeon: [name] T. Nile Riggs;  Location: [facility];  Service: Ophthalmology;  Laterality: Left;  CDE 1.81  . Cataract extraction w/phaco 07/04/2011    Procedure: CATARACT EXTRACTION PHACO AND INTRAOCULAR LENS PLACEMENT (IOC);  Surgeon: [name] T. Nile Riggs;  Location: [facility];  Service: Ophthalmology;  Laterality: Right;  CDE: 1.76    PT Assessment/Plan/Recommendation PT Assessment Clinical Impression Statement: pt is ver lucid and cooperative with main problem being that of gait ataxia and needs a walker for improved stability...recommend  a HHPT consult for home safety...no known DME needs PT Recommendation/Assessment: All further PT needs can be met in the next venue of care PT Problem List: Decreased mobility;Other (comment) (gait ataxia) PT Therapy Diagnosis : Abnormality of gait PT Recommendation Follow Up Recommendations: Home health PT Equipment Recommended: Defer to next venue PT Goals     PT Evaluation Precautions/Restrictions  Precautions Precautions: Fall Required Braces or Orthoses: No Restrictions Weight Bearing Restrictions: No Prior Functioning  Home Living Lives With:  Significant other Receives Help From: Family;Friend(s) Type of Home: Mobile home Home Layout: One level Home Access: Stairs to enter Entrance Stairs-Rails: Right Entrance Stairs-Number of Steps: 3 Bathroom Shower/Tub: Engineer, manufacturing systems: Standard Bathroom Accessibility: Yes How Accessible: Accessible via walker Home Adaptive Equipment: Walker - rolling;Wheelchair - manual Prior Function Level of Independence: Independent with basic ADLs;Independent with gait;Independent with transfers;Independent with homemaking with ambulation Driving: Yes Cognition Cognition Arousal/Alertness: Awake/alert Overall Cognitive Status: Appears within functional limits for tasks assessed Orientation Level: Oriented X4 Sensation/Coordination Sensation Light Touch: Appears Intact Proprioception: Appears Intact Extremity Assessment RUE Assessment RUE Assessment: Within Functional Limits LUE Assessment LUE Assessment: Within Functional Limits RLE Assessment RLE Assessment: Within Functional Limits LLE Assessment LLE Assessment: Within Functional Limits Mobility (including Balance) Bed Mobility Bed Mobility: Yes Supine to Sit: 7: Independent Sitting - Scoot to Edge of Bed: 7: Independent Transfers Transfers: Yes Sit to Stand: 6: Modified independent (Device/Increase time) Stand to Sit: 6: Modified independent (Device/Increase time) Ambulation/Gait Ambulation/Gait: Yes Ambulation/Gait Assistance: 5: Supervision Ambulation/Gait Assistance Details (indicate cue type and reason): gait is ataxic with "jerking" motions of UEs and LEs...she is able to ambulate with no assistive device, but not safely Ambulation Distance (Feet): 150 Feet Assistive device: Rolling walker Gait Pattern: Ataxic Gait velocity: walks very rapidly...needs cues to slow pace Stairs: Yes Stairs Assistance: 5: Supervision Stairs Assistance Details (indicate cue type and reason): cues to slow down and do a "step  to" pattern Stair Management Technique: Step to pattern;One rail Right Number of Stairs: 12  Wheelchair Mobility Wheelchair Mobility: No  Posture/Postural Control Posture/Postural Control: No significant  limitations Balance Balance Assessed: Yes Dynamic Standing Balance Dynamic Standing - Level of Assistance: 5: Stand by assistance Exercise    End of Session PT - End of Session Equipment Utilized During Treatment: Gait belt Activity Tolerance: Patient tolerated treatment well Patient left: in bed;with call bell in reach;Other (comment) (sitter present) Nurse Communication: Mobility status for transfers;Mobility status for ambulation General Behavior During Session: [facility] for tasks performed Cognition: [facility] for tasks performed  Konrad Penta 07/31/2011, 11:20 AM

## 2011-07-31 NOTE — Progress Notes (Signed)
PIV removed without complaint, patient discharged home with home health. Patient verbalizes understanding of discharge instructions, home health and follow up care. Patient escorted out by staff, transported by family.

## 2011-07-31 NOTE — Consult Note (Signed)
Pt evaluated by PT today and home health recommended.  Pt is agreeable to return home.  ACT team signed off on pt as inpatient criteria not met.  No further CSW needs reported.  CSW to sign off.  MD is aware.  Karn Cassis

## 2011-08-01 LAB — CSF CULTURE W GRAM STAIN

## 2011-08-07 NOTE — Consult Note (Signed)
[name], [name]                ACCOUNT NO.:  [id number]  MEDICAL RECORD NO.:  [id number]  LOCATION:  [facility]                         FACILITY:  [facility]  PHYSICIAN:  Mauro Kaufmann, MD         DATE OF BIRTH:  09/15/1966  DATE OF CONSULTATION: DATE OF DISCHARGE:                                CONSULTATION   CHIEF COMPLAINT:  Anxiety and agitation.  HISTORY OF PRESENT ILLNESS:  This is a 45 year old female who came to the hospital with changes of altered mental status, anxiety, and agitation going on for the past 2 weeks as per the patient who can provide the history at this time and is not confused at this time, the patient is able to answer the questions.  She is alert and oriented x3, and as per the patient, after she had a cataract surgery 2 weeks ago, she has been having anxiety and agitation.  The family says that she also has been having some hallucinations at times and she is constantly restless and not been able to eat and drink well.  The patient also has not been able to sleep well at night.  Also, the patient has been complaining of some cramping in the hands and legs.  The patient has a history of chronic neck pain.  She has a neck surgery 3 years ago and has been on chronic pain medication including the Percocet 2 tablets a day along with Celexa 40 mg, and Valium 10 mg every day.  The patient also has a thyroid problem as per family, but she was never diagnosed and has never been on medications.  The patient has been a smoker.  She usually smokes 2 packs per day, but since the surgery her smoking has been cut down as the patient has not been able to hold her cigarettes well because of the constant tremors.  The patient drinks soda almost all the time and she has even brought a bottle of [facility] with her in the ER.  There has been no other symptoms of chest pain.  No shortness of breath.  The patient admits to having blurred vision, but this could be attributed to her  vision problem.  The patient had a cataract surgery.  At this time, the patient denies any blurred vision. No history of seizures.  No history of stroke.  PAST MEDICAL HISTORY:  Significant for: 1. Anxiety. 2. Depression. 3. COPD. 4. Multiple sclerosis.  PAST SURGICAL HISTORY:  Significant for: 1. Abdominal hysterectomy. 2. C-section. 3. Cervical fusion. 4. Cervical disk surgery times x2. 5. Tonsillectomy. 6. Cataract surgery.  FAMILY HISTORY:  Negative.  SOCIAL HISTORY:  The patient smokes 2 packs per day.  There is no history of alcohol abuse.  No history of illicit drug abuse.  CURRENT MEDICATIONS: 1. Albuterol inhaler 2 puffs every 6 hours as needed. 2. Albuterol nebulizer 2.5 mg every 6 hours as needed for COPD. 3. Citalopram 40 mg p.o. daily. 4. Cyclobenzaprine 10 mg p.o. at bedtime. 5. Diazepam 5 mg p.o. 2 tablets daily as needed. 6. Esomeprazole 20 mg p.o. daily. 7. Magnesium 20 mg p.o. before breakfast. 8. Percocet 10/325 one tablet p.o.  every 6 hours as needed. 9. Meloxicam 7.5 mg p.o. 3 times a day. 10.Potassium chloride 20 mEq p.o. daily 3 times a day.  REVIEW OF SYSTEMS:  As in HPI.  PHYSICAL EXAMINATION:  VITAL SIGNS:  The patient's blood pressure is 124/77, respiration 24, pulse 97, temp 98.7. HEENT:  Head is atraumatic, normocephalic.  Eyes, extraocular muscles are intact.  Oral mucosa is mildly dry.  Poor oral hygiene. NECK:  Supple.  No neck stiffness noted. CHEST:  Clear to auscultation bilaterally. HEART:  S1, S2.  Regular in rate and rhythm. ABDOMEN:  Soft, nontender.  No organomegaly. EXTREMITIES:  No cyanosis, no clubbing, no edema. NEUROLOGIC:  The patient is alert and oriented x3, has tremors.  Motor strength is 5/5 in both upper and lower extremities.  No focal deficit noted at this time.  Imaging studies done in the hospital, CT head without contrast showed no acute abnormality.  MRA of the brain without contrast showed negative for acute  abnormality.  PERTINENT LABORATORY DATA:  Sodium 142, potassium 3.0, chloride is 102, CO2 of 30, BUN is 7, creatinine 0.47, calcium is 8.8.  Alk phos 115. WBC 10.2, hemoglobin 12.8, hematocrit 38.0, platelet count of 495. Glucose 115.  Urinalysis is negative.  ASSESSMENT: 1. Anxiety/agitation. 2. History of tobacco abuse. 3. History of chronic obstructive pulmonary disease. 4. Chronic pain syndrome.  PLAN:  The patient was offered admission in the hospital at this time. She is refusing the admission.  I have discussed with the ER physician, Dr. Adriana Simas regarding the workup, that the patient will benefit from getting a magnesium level, replacing her potassium, and also starting the patient on a nicotine patch as the patient is going through some type of withdrawal reaction.  Also, the patient needs to be admitted and will benefit from a neurology consultation to rule out any underlying exacerbation of multiple sclerosis though the MRI does not show any typical lesions of multiple sclerosis. Or any other neurological problem.  Also, the patient will benefit from checking her TSH, T3, T4 to rule out any underlying thyrotoxicosis contributing to her symptoms.  Total time spent in this consultation is 45 minutes.     Mauro Kaufmann, MD     GL/MEDQ  D:  07/24/2011  T:  07/24/2011  Job:  [id number]  Electronically Signed by Sibyl Parr Cherie Lasalle  on 08/07/2011 09:54:48 AM

## 2011-10-20 DIAGNOSIS — R209 Unspecified disturbances of skin sensation: Secondary | ICD-10-CM | POA: Diagnosis not present

## 2011-10-20 DIAGNOSIS — G63 Polyneuropathy in diseases classified elsewhere: Secondary | ICD-10-CM | POA: Insufficient documentation

## 2011-10-20 DIAGNOSIS — Z9889 Other specified postprocedural states: Secondary | ICD-10-CM | POA: Diagnosis not present

## 2011-10-20 DIAGNOSIS — E538 Deficiency of other specified B group vitamins: Secondary | ICD-10-CM | POA: Insufficient documentation

## 2011-10-20 DIAGNOSIS — G43909 Migraine, unspecified, not intractable, without status migrainosus: Secondary | ICD-10-CM | POA: Diagnosis not present

## 2011-10-20 DIAGNOSIS — G959 Disease of spinal cord, unspecified: Secondary | ICD-10-CM | POA: Diagnosis not present

## 2011-10-20 DIAGNOSIS — M542 Cervicalgia: Secondary | ICD-10-CM | POA: Diagnosis not present

## 2012-01-29 DIAGNOSIS — G589 Mononeuropathy, unspecified: Secondary | ICD-10-CM | POA: Diagnosis not present

## 2012-01-29 DIAGNOSIS — G609 Hereditary and idiopathic neuropathy, unspecified: Secondary | ICD-10-CM | POA: Diagnosis not present

## 2012-04-08 DIAGNOSIS — E042 Nontoxic multinodular goiter: Secondary | ICD-10-CM | POA: Diagnosis not present

## 2012-04-08 DIAGNOSIS — R1310 Dysphagia, unspecified: Secondary | ICD-10-CM | POA: Diagnosis not present

## 2012-04-09 DIAGNOSIS — Z79899 Other long term (current) drug therapy: Secondary | ICD-10-CM | POA: Diagnosis not present

## 2012-04-09 DIAGNOSIS — M542 Cervicalgia: Secondary | ICD-10-CM | POA: Diagnosis not present

## 2012-04-24 DIAGNOSIS — F4320 Adjustment disorder, unspecified: Secondary | ICD-10-CM | POA: Diagnosis not present

## 2012-04-30 DIAGNOSIS — G589 Mononeuropathy, unspecified: Secondary | ICD-10-CM | POA: Diagnosis not present

## 2012-05-14 DIAGNOSIS — Z961 Presence of intraocular lens: Secondary | ICD-10-CM | POA: Diagnosis not present

## 2012-07-08 ENCOUNTER — Other Ambulatory Visit: Payer: Self-pay | Admitting: Family Medicine

## 2012-07-08 DIAGNOSIS — J069 Acute upper respiratory infection, unspecified: Secondary | ICD-10-CM | POA: Diagnosis not present

## 2012-07-08 DIAGNOSIS — R358 Other polyuria: Secondary | ICD-10-CM | POA: Diagnosis not present

## 2012-07-08 DIAGNOSIS — Z139 Encounter for screening, unspecified: Secondary | ICD-10-CM

## 2012-07-08 DIAGNOSIS — G609 Hereditary and idiopathic neuropathy, unspecified: Secondary | ICD-10-CM | POA: Diagnosis not present

## 2012-07-08 DIAGNOSIS — Z23 Encounter for immunization: Secondary | ICD-10-CM | POA: Diagnosis not present

## 2012-07-08 DIAGNOSIS — R3589 Other polyuria: Secondary | ICD-10-CM | POA: Diagnosis not present

## 2012-07-09 ENCOUNTER — Ambulatory Visit (HOSPITAL_COMMUNITY): Payer: BC Managed Care – PPO

## 2012-07-23 DIAGNOSIS — R0789 Other chest pain: Secondary | ICD-10-CM | POA: Diagnosis not present

## 2012-07-23 DIAGNOSIS — M940 Chondrocostal junction syndrome [Tietze]: Secondary | ICD-10-CM | POA: Diagnosis not present

## 2012-08-06 DIAGNOSIS — T7840XA Allergy, unspecified, initial encounter: Secondary | ICD-10-CM | POA: Diagnosis not present

## 2012-08-06 DIAGNOSIS — L299 Pruritus, unspecified: Secondary | ICD-10-CM | POA: Diagnosis not present

## 2012-09-23 DIAGNOSIS — M542 Cervicalgia: Secondary | ICD-10-CM | POA: Diagnosis not present

## 2012-09-23 DIAGNOSIS — Z79899 Other long term (current) drug therapy: Secondary | ICD-10-CM | POA: Diagnosis not present

## 2012-10-29 DIAGNOSIS — H26499 Other secondary cataract, unspecified eye: Secondary | ICD-10-CM | POA: Diagnosis not present

## 2012-10-29 DIAGNOSIS — Z961 Presence of intraocular lens: Secondary | ICD-10-CM | POA: Diagnosis not present

## 2012-11-06 ENCOUNTER — Other Ambulatory Visit: Payer: Self-pay | Admitting: Family Medicine

## 2012-11-06 DIAGNOSIS — I871 Compression of vein: Secondary | ICD-10-CM | POA: Diagnosis not present

## 2012-11-11 ENCOUNTER — Ambulatory Visit (HOSPITAL_COMMUNITY): Payer: BC Managed Care – PPO

## 2012-11-12 ENCOUNTER — Ambulatory Visit (HOSPITAL_COMMUNITY)
Admission: RE | Admit: 2012-11-12 | Discharge: 2012-11-12 | Disposition: A | Discharge status: Home / Self Care | Payer: BC Managed Care – PPO | Source: Ambulatory Visit | Attending: Family Medicine | Admitting: Family Medicine

## 2012-11-12 ENCOUNTER — Ambulatory Visit (HOSPITAL_COMMUNITY): Payer: BC Managed Care – PPO

## 2012-11-12 DIAGNOSIS — Z1231 Encounter for screening mammogram for malignant neoplasm of breast: Secondary | ICD-10-CM | POA: Insufficient documentation

## 2012-11-12 DIAGNOSIS — Z961 Presence of intraocular lens: Secondary | ICD-10-CM | POA: Diagnosis not present

## 2012-11-12 DIAGNOSIS — Z139 Encounter for screening, unspecified: Secondary | ICD-10-CM

## 2012-11-12 DIAGNOSIS — H26499 Other secondary cataract, unspecified eye: Secondary | ICD-10-CM | POA: Diagnosis not present

## 2012-11-13 ENCOUNTER — Other Ambulatory Visit: Payer: Self-pay | Admitting: Family Medicine

## 2012-11-13 DIAGNOSIS — R928 Other abnormal and inconclusive findings on diagnostic imaging of breast: Secondary | ICD-10-CM

## 2012-11-25 ENCOUNTER — Other Ambulatory Visit: Payer: Self-pay | Admitting: Family Medicine

## 2012-11-25 DIAGNOSIS — R928 Other abnormal and inconclusive findings on diagnostic imaging of breast: Secondary | ICD-10-CM

## 2012-11-26 ENCOUNTER — Encounter (HOSPITAL_COMMUNITY): Payer: Self-pay | Admitting: Pharmacy Technician

## 2012-11-27 ENCOUNTER — Ambulatory Visit (HOSPITAL_COMMUNITY)
Admission: RE | Admit: 2012-11-27 | Discharge: 2012-11-27 | Disposition: A | Discharge status: Home / Self Care | Payer: BC Managed Care – PPO | Source: Ambulatory Visit | Attending: Family Medicine | Admitting: Family Medicine

## 2012-11-27 DIAGNOSIS — R928 Other abnormal and inconclusive findings on diagnostic imaging of breast: Secondary | ICD-10-CM | POA: Insufficient documentation

## 2012-12-02 ENCOUNTER — Encounter (HOSPITAL_COMMUNITY): Payer: Self-pay | Admitting: Pharmacy Technician

## 2012-12-03 ENCOUNTER — Encounter (HOSPITAL_COMMUNITY): Payer: Self-pay | Admitting: *Deleted

## 2012-12-03 ENCOUNTER — Ambulatory Visit (HOSPITAL_COMMUNITY)
Admission: RE | Admit: 2012-12-03 | Discharge: 2012-12-03 | Disposition: A | Discharge status: Home / Self Care | Payer: BC Managed Care – PPO | Source: Ambulatory Visit | Attending: Ophthalmology | Admitting: Ophthalmology

## 2012-12-03 ENCOUNTER — Encounter (HOSPITAL_COMMUNITY)
Admission: RE | Disposition: A | Discharge status: Home / Self Care | Payer: Self-pay | Source: Ambulatory Visit | Attending: Ophthalmology

## 2012-12-03 DIAGNOSIS — H26499 Other secondary cataract, unspecified eye: Secondary | ICD-10-CM | POA: Insufficient documentation

## 2012-12-03 DIAGNOSIS — R609 Edema, unspecified: Secondary | ICD-10-CM | POA: Diagnosis not present

## 2012-12-03 HISTORY — PX: YAG LASER APPLICATION: SHX6189

## 2012-12-03 SURGERY — TREATMENT, USING YAG LASER
Anesthesia: LOCAL | Laterality: Right

## 2012-12-03 MED ORDER — TROPICAMIDE 1 % OP SOLN
OPHTHALMIC | Status: AC
  Filled 2012-12-03: qty 3

## 2012-12-03 MED ORDER — TROPICAMIDE 1 % OP SOLN
1.0000 [drp] | OPHTHALMIC | Status: AC
  Administered 2012-12-03 (×2): 1 [drp] via OPHTHALMIC

## 2012-12-03 NOTE — Brief Op Note (Signed)
[name] 12/03/2012  Jacier Gladu T. Nile Riggs, MD  Yag Laser Self Test Completedyes. Procedure: Posterior Capsulotomy, right eye.  Eye Protection Worn by Staff yes. Laser In Use Sign on Door yes.  Laser: Nd:YAG Spot Size: Fixed Burst Mode: III Power Setting: 3.4 mJ/burst  Number of shots: 24 Total energy delivered: 80.2 mJ  Patency of the peripheral iridotomy was confirmed visually.  The patient tolerated the procedure without difficulty. No complications were encountered.    The patient was discharged home with the instructions to continue all her current glaucoma medications, if any.   Patient instructed to go to office at 0200 for intraocular pressure check.  Patient verbalizes understanding of discharge instructions yes.

## 2012-12-03 NOTE — H&P (Signed)
The patient was re examined and there is no change in the patients condition since the original H and P. 

## 2012-12-04 ENCOUNTER — Encounter (HOSPITAL_COMMUNITY): Payer: Self-pay | Admitting: Ophthalmology

## 2012-12-17 ENCOUNTER — Ambulatory Visit (HOSPITAL_COMMUNITY)
Admission: RE | Admit: 2012-12-17 | Discharge: 2012-12-17 | Disposition: A | Discharge status: Home Health | Payer: BC Managed Care – PPO | Source: Ambulatory Visit | Attending: Ophthalmology | Admitting: Ophthalmology

## 2012-12-17 ENCOUNTER — Encounter (HOSPITAL_COMMUNITY): Payer: Self-pay | Admitting: *Deleted

## 2012-12-17 ENCOUNTER — Encounter (HOSPITAL_COMMUNITY)
Admission: RE | Disposition: A | Discharge status: Home Health | Payer: Self-pay | Source: Ambulatory Visit | Attending: Ophthalmology

## 2012-12-17 DIAGNOSIS — H269 Unspecified cataract: Secondary | ICD-10-CM | POA: Insufficient documentation

## 2012-12-17 DIAGNOSIS — H26499 Other secondary cataract, unspecified eye: Secondary | ICD-10-CM | POA: Diagnosis not present

## 2012-12-17 HISTORY — PX: YAG LASER APPLICATION: SHX6189

## 2012-12-17 SURGERY — TREATMENT, USING YAG LASER
Anesthesia: LOCAL | Laterality: Left

## 2012-12-17 MED ORDER — TROPICAMIDE 1 % OP SOLN
1.0000 [drp] | OPHTHALMIC | Status: AC
  Administered 2012-12-17 (×2): 1 [drp] via OPHTHALMIC

## 2012-12-17 MED ORDER — TROPICAMIDE 1 % OP SOLN
OPHTHALMIC | Status: AC
  Filled 2012-12-17: qty 3

## 2012-12-17 NOTE — Brief Op Note (Signed)
[name] 12/17/2012  Mark T. Nile Riggs, MD  Yag Laser Self Test Completedyes. Procedure: Posterior Capsulotomy, left eye.  Eye Protection Worn by Staff yes. Laser In Use Sign on Door yes.  Laser: Nd:YAG Spot Size: Fixed Burst Mode: III Power Setting: 3.6 mJ/burst  Number of shots: 26 Total energy delivered: 94.3 mJ  Patency of the peripheral iridotomy was confirmed visually.  The patient tolerated the procedure without difficulty. No complications were encountered.    The patient was discharged home with the instructions to continue all her current glaucoma medications, if any.   Patient instructed to go to office at 0100 for intraocular pressure check.  Patient verbalizes understanding of discharge instructions yes.

## 2012-12-17 NOTE — H&P (Signed)
The patient was re examined and there is no change in the patients condition since the original H and P. 

## 2012-12-19 ENCOUNTER — Encounter (HOSPITAL_COMMUNITY): Payer: Self-pay | Admitting: Ophthalmology

## 2012-12-30 ENCOUNTER — Ambulatory Visit (INDEPENDENT_AMBULATORY_CARE_PROVIDER_SITE_OTHER): Payer: BC Managed Care – PPO | Admitting: Family Medicine

## 2012-12-30 ENCOUNTER — Encounter: Payer: Self-pay | Admitting: Family Medicine

## 2012-12-30 VITALS — BP 110/58 | Temp 97.6°F | Ht <= 58 in | Wt 168.0 lb

## 2012-12-30 DIAGNOSIS — J45909 Unspecified asthma, uncomplicated: Secondary | ICD-10-CM | POA: Insufficient documentation

## 2012-12-30 DIAGNOSIS — R600 Localized edema: Secondary | ICD-10-CM

## 2012-12-30 DIAGNOSIS — R29898 Other symptoms and signs involving the musculoskeletal system: Secondary | ICD-10-CM | POA: Diagnosis not present

## 2012-12-30 DIAGNOSIS — E871 Hypo-osmolality and hyponatremia: Secondary | ICD-10-CM | POA: Diagnosis not present

## 2012-12-30 DIAGNOSIS — R609 Edema, unspecified: Secondary | ICD-10-CM

## 2012-12-30 DIAGNOSIS — R300 Dysuria: Secondary | ICD-10-CM | POA: Diagnosis not present

## 2012-12-30 DIAGNOSIS — G894 Chronic pain syndrome: Secondary | ICD-10-CM

## 2012-12-30 DIAGNOSIS — N289 Disorder of kidney and ureter, unspecified: Secondary | ICD-10-CM | POA: Diagnosis not present

## 2012-12-30 LAB — POCT URINALYSIS DIPSTICK: pH, UA: 7

## 2012-12-30 MED ORDER — ALBUTEROL SULFATE HFA 108 (90 BASE) MCG/ACT IN AERS: 2.0000 | INHALATION_SPRAY | Freq: Four times a day (QID) | RESPIRATORY_TRACT | Status: DC | PRN

## 2012-12-30 MED ORDER — TORSEMIDE 20 MG PO TABS: 20.0000 mg | ORAL_TABLET | Freq: Every day | ORAL | Status: DC

## 2012-12-30 MED ORDER — OXYCODONE-ACETAMINOPHEN 10-325 MG PO TABS: 1.0000 | ORAL_TABLET | ORAL | Status: DC | PRN

## 2012-12-30 NOTE — Assessment & Plan Note (Signed)
Reactive airways stable we will use the inhaler on a regular basis as needed if she starts having use it frequently she will need preventative medicine.

## 2012-12-30 NOTE — Progress Notes (Signed)
Patient ID: [name], female   DOB: 1966-08-26, 47 y.o.   MRN: [id number] Please see other assessment notes. In addition to this patient is having pedal edema that is progressively getting worse. She states the pain clinic placed her on ibuprofen as well his Mobic and I told her that she does not need to be taking both she will stop the ibuprofen we will need to check a metabolic 7 level I also prescribed torsemide 20 mg 2 each morning to help with her fluid. She will check her metabolic 7 level in followup here within 2 weeks she was encouraged to minimize salt intake. Total time spent with patient 25 minutes. Please see other notes for further details. Her past medical history family history were all reviewed as well as medication list and social history.  Vital signs were noted lungs were clear no crackles heart was regular pulse normal abdomen soft moderate 1-2+ edema was noted in the legs patient was slow with her walking was alert and oriented.  Dysuria - Plan: POCT UA - Microscopic Only, POCT urinalysis dipstick, Basic metabolic panel  Pedal edema - Plan: torsemide (DEMADEX) 20 MG tablet  Chronic pain syndrome - Plan: oxyCODONE-acetaminophen (PERCOCET) 10-325 MG per tablet  Reactive airway disease - Plan: albuterol (PROVENTIL HFA;VENTOLIN HFA) 108 (90 BASE) MCG/ACT inhaler

## 2012-12-30 NOTE — Patient Instructions (Addendum)
Stop ibuprofen   may use your oxycodone 15 mg through April 10. On April 10 you may start Percocet 10 mg/325 mg 1 every 4 hours as needed for severe pain no greater than 5 per day.  I would like to see you back in approximately 2 weeks.  Use torsemide 20 mg 2 each morning to help with the swelling in your legs. When you come to your next visit please bring all of your medicines with you so we can review them. Call us sooner if any problems. Please go do your lab work this week.

## 2012-12-30 NOTE — Assessment & Plan Note (Signed)
I believe the patient does need pain medication order to function properly we will use the medication as prescribed. She will follow up again with [place] in 3 months time.

## 2013-01-06 ENCOUNTER — Telehealth: Payer: Self-pay | Admitting: Family Medicine

## 2013-01-06 MED ORDER — CITALOPRAM HYDROBROMIDE 20 MG PO TABS: 20.0000 mg | ORAL_TABLET | Freq: Every day | ORAL | Status: DC

## 2013-01-06 NOTE — Telephone Encounter (Signed)
Patient needs a refill of her Celexa 20mg  to [facility]

## 2013-01-07 ENCOUNTER — Other Ambulatory Visit: Payer: Self-pay | Admitting: *Deleted

## 2013-01-07 MED ORDER — CITALOPRAM HYDROBROMIDE 20 MG PO TABS: 20.0000 mg | ORAL_TABLET | Freq: Every day | ORAL | Status: DC

## 2013-01-08 ENCOUNTER — Encounter: Payer: Self-pay | Admitting: *Deleted

## 2013-01-09 LAB — BASIC METABOLIC PANEL
Calcium: 8.7 mg/dL (ref 8.4–10.5)
Chloride: 82 mEq/L — ABNORMAL LOW (ref 96–112)
Creat: 0.81 mg/dL (ref 0.50–1.10)

## 2013-01-13 ENCOUNTER — Ambulatory Visit (INDEPENDENT_AMBULATORY_CARE_PROVIDER_SITE_OTHER): Payer: BC Managed Care – PPO | Admitting: Family Medicine

## 2013-01-13 ENCOUNTER — Encounter (HOSPITAL_COMMUNITY): Payer: Self-pay | Admitting: Cardiology

## 2013-01-13 ENCOUNTER — Encounter: Payer: Self-pay | Admitting: Family Medicine

## 2013-01-13 ENCOUNTER — Observation Stay (HOSPITAL_COMMUNITY)
Admission: EM | Admit: 2013-01-13 | Discharge: 2013-01-15 | DRG: 296 | Disposition: A | Discharge status: Home / Self Care | Payer: BC Managed Care – PPO | Attending: Internal Medicine | Admitting: Internal Medicine

## 2013-01-13 VITALS — BP 110/68 | Ht <= 58 in | Wt 176.8 lb

## 2013-01-13 DIAGNOSIS — Z9010 Acquired absence of unspecified breast and nipple: Secondary | ICD-10-CM

## 2013-01-13 DIAGNOSIS — E871 Hypo-osmolality and hyponatremia: Secondary | ICD-10-CM

## 2013-01-13 DIAGNOSIS — Z888 Allergy status to other drugs, medicaments and biological substances status: Secondary | ICD-10-CM

## 2013-01-13 DIAGNOSIS — J45909 Unspecified asthma, uncomplicated: Secondary | ICD-10-CM | POA: Diagnosis not present

## 2013-01-13 DIAGNOSIS — Z6837 Body mass index (BMI) 37.0-37.9, adult: Secondary | ICD-10-CM

## 2013-01-13 DIAGNOSIS — G5790 Unspecified mononeuropathy of unspecified lower limb: Secondary | ICD-10-CM | POA: Diagnosis present

## 2013-01-13 DIAGNOSIS — R600 Localized edema: Secondary | ICD-10-CM | POA: Insufficient documentation

## 2013-01-13 DIAGNOSIS — Z880 Allergy status to penicillin: Secondary | ICD-10-CM

## 2013-01-13 DIAGNOSIS — G9340 Encephalopathy, unspecified: Secondary | ICD-10-CM

## 2013-01-13 DIAGNOSIS — E860 Dehydration: Secondary | ICD-10-CM

## 2013-01-13 DIAGNOSIS — R079 Chest pain, unspecified: Secondary | ICD-10-CM

## 2013-01-13 DIAGNOSIS — T502X5A Adverse effect of carbonic-anhydrase inhibitors, benzothiadiazides and other diuretics, initial encounter: Secondary | ICD-10-CM | POA: Diagnosis present

## 2013-01-13 DIAGNOSIS — G894 Chronic pain syndrome: Secondary | ICD-10-CM | POA: Diagnosis present

## 2013-01-13 DIAGNOSIS — M542 Cervicalgia: Secondary | ICD-10-CM | POA: Diagnosis present

## 2013-01-13 DIAGNOSIS — F19239 Other psychoactive substance dependence with withdrawal, unspecified: Secondary | ICD-10-CM

## 2013-01-13 DIAGNOSIS — F411 Generalized anxiety disorder: Secondary | ICD-10-CM | POA: Diagnosis present

## 2013-01-13 DIAGNOSIS — IMO0001 Reserved for inherently not codable concepts without codable children: Secondary | ICD-10-CM | POA: Diagnosis present

## 2013-01-13 DIAGNOSIS — G5690 Unspecified mononeuropathy of unspecified upper limb: Secondary | ICD-10-CM | POA: Diagnosis present

## 2013-01-13 DIAGNOSIS — Y92009 Unspecified place in unspecified non-institutional (private) residence as the place of occurrence of the external cause: Secondary | ICD-10-CM

## 2013-01-13 DIAGNOSIS — R29898 Other symptoms and signs involving the musculoskeletal system: Secondary | ICD-10-CM | POA: Diagnosis not present

## 2013-01-13 DIAGNOSIS — G35 Multiple sclerosis: Secondary | ICD-10-CM | POA: Diagnosis present

## 2013-01-13 DIAGNOSIS — Z90710 Acquired absence of both cervix and uterus: Secondary | ICD-10-CM

## 2013-01-13 DIAGNOSIS — J4489 Other specified chronic obstructive pulmonary disease: Secondary | ICD-10-CM | POA: Diagnosis present

## 2013-01-13 DIAGNOSIS — N289 Disorder of kidney and ureter, unspecified: Secondary | ICD-10-CM | POA: Diagnosis not present

## 2013-01-13 DIAGNOSIS — R300 Dysuria: Secondary | ICD-10-CM | POA: Diagnosis not present

## 2013-01-13 DIAGNOSIS — F3289 Other specified depressive episodes: Secondary | ICD-10-CM | POA: Diagnosis present

## 2013-01-13 DIAGNOSIS — F17200 Nicotine dependence, unspecified, uncomplicated: Secondary | ICD-10-CM | POA: Diagnosis present

## 2013-01-13 DIAGNOSIS — M545 Low back pain, unspecified: Secondary | ICD-10-CM | POA: Diagnosis present

## 2013-01-13 DIAGNOSIS — F509 Eating disorder, unspecified: Secondary | ICD-10-CM | POA: Diagnosis present

## 2013-01-13 DIAGNOSIS — R42 Dizziness and giddiness: Secondary | ICD-10-CM

## 2013-01-13 DIAGNOSIS — T4275XA Adverse effect of unspecified antiepileptic and sedative-hypnotic drugs, initial encounter: Secondary | ICD-10-CM | POA: Diagnosis present

## 2013-01-13 DIAGNOSIS — E669 Obesity, unspecified: Secondary | ICD-10-CM | POA: Diagnosis present

## 2013-01-13 DIAGNOSIS — R0602 Shortness of breath: Secondary | ICD-10-CM

## 2013-01-13 DIAGNOSIS — R609 Edema, unspecified: Secondary | ICD-10-CM

## 2013-01-13 DIAGNOSIS — G253 Myoclonus: Secondary | ICD-10-CM

## 2013-01-13 DIAGNOSIS — Z720 Tobacco use: Secondary | ICD-10-CM | POA: Diagnosis present

## 2013-01-13 DIAGNOSIS — Z981 Arthrodesis status: Secondary | ICD-10-CM

## 2013-01-13 DIAGNOSIS — Z91040 Latex allergy status: Secondary | ICD-10-CM

## 2013-01-13 DIAGNOSIS — Z9089 Acquired absence of other organs: Secondary | ICD-10-CM

## 2013-01-13 DIAGNOSIS — F329 Major depressive disorder, single episode, unspecified: Secondary | ICD-10-CM | POA: Diagnosis present

## 2013-01-13 DIAGNOSIS — Z885 Allergy status to narcotic agent status: Secondary | ICD-10-CM

## 2013-01-13 DIAGNOSIS — F1920 Other psychoactive substance dependence, uncomplicated: Secondary | ICD-10-CM | POA: Diagnosis present

## 2013-01-13 DIAGNOSIS — J449 Chronic obstructive pulmonary disease, unspecified: Secondary | ICD-10-CM | POA: Diagnosis present

## 2013-01-13 DIAGNOSIS — E538 Deficiency of other specified B group vitamins: Secondary | ICD-10-CM | POA: Diagnosis present

## 2013-01-13 DIAGNOSIS — Z79899 Other long term (current) drug therapy: Secondary | ICD-10-CM

## 2013-01-13 DIAGNOSIS — Z87442 Personal history of urinary calculi: Secondary | ICD-10-CM

## 2013-01-13 DIAGNOSIS — M19049 Primary osteoarthritis, unspecified hand: Secondary | ICD-10-CM | POA: Diagnosis present

## 2013-01-13 LAB — BASIC METABOLIC PANEL
CO2: 36 mEq/L — ABNORMAL HIGH (ref 19–32)
Calcium: 9 mg/dL (ref 8.4–10.5)
Chloride: 76 mEq/L — ABNORMAL LOW (ref 96–112)
Chloride: 77 mEq/L — ABNORMAL LOW (ref 96–112)
Glucose, Bld: 98 mg/dL (ref 70–99)
Potassium: 4.3 mEq/L (ref 3.5–5.3)
Sodium: 121 mEq/L — ABNORMAL LOW (ref 135–145)

## 2013-01-13 LAB — POCT URINALYSIS DIPSTICK: Spec Grav, UA: <=1.005

## 2013-01-13 LAB — CBC WITH DIFFERENTIAL/PLATELET
Eosinophils Relative: 1 % (ref 0–5)
HCT: 39.6 % (ref 36.0–46.0)
Lymphocytes Relative: 27 % (ref 12–46)
Lymphs Abs: 3.1 10*3/uL (ref 0.7–4.0)
MCV: 93 fL (ref 78.0–100.0)
Monocytes Absolute: 0.7 10*3/uL (ref 0.1–1.0)
Platelets: 383 10*3/uL (ref 150–400)
RBC: 4.26 MIL/uL (ref 3.87–5.11)
WBC: 11.7 10*3/uL — ABNORMAL HIGH (ref 4.0–10.5)

## 2013-01-13 LAB — HEPATIC FUNCTION PANEL
ALT: 19 U/L (ref 0–35)
Albumin: 3 g/dL — ABNORMAL LOW (ref 3.5–5.2)
Alkaline Phosphatase: 175 U/L — ABNORMAL HIGH (ref 39–117)
Total Bilirubin: 0.4 mg/dL (ref 0.3–1.2)

## 2013-01-13 MED ORDER — SODIUM CHLORIDE 0.9 % IV BOLUS (SEPSIS)
500.0000 mL | Freq: Once | INTRAVENOUS | Status: AC
  Administered 2013-01-13: 500 mL via INTRAVENOUS

## 2013-01-13 MED ORDER — NICOTINE 14 MG/24HR TD PT24
14.0000 mg | MEDICATED_PATCH | Freq: Every day | TRANSDERMAL | Status: DC
  Filled 2013-01-13: qty 1

## 2013-01-13 MED ORDER — OXYCODONE-ACETAMINOPHEN 10-325 MG PO TABS: 1.0000 | ORAL_TABLET | ORAL | Status: DC | PRN

## 2013-01-13 NOTE — ED Provider Notes (Signed)
History  This chart was scribed for Joya Gaskins, MD by Bennett Scrape, ED Scribe. This patient was seen in room [facility] and the patient's care was started at 8:44 PM.  CSN: [id number]  Arrival date & time 01/13/13  2021   First MD Initiated Contact with Patient 01/13/13 2044      Chief Complaint  Patient presents with  . electrolyte imbalance      The history is provided by the patient. No language interpreter was used.    [name] is a 47 y.o. female who presents to the Emergency Department complaining of an electrolyte imbalance noted during her PCP visit today. Pt was seen in Dr. Lorin Picket [name]'s office today and was told that she had sodium level of 120 today. She was told it may be due to the pain medication that she was on for chronic neck, back and extremity pain from "nerve damage". She reports chronic bilateral lower leg swelling but denies fevers, CP, abdominal pain, syncope, SOB and HA as associated symptoms. She denies any heart surgeries or known cardiac history. She has a h/o anxiety, COPD and MS. She is a current everyday smoker but denies alcohol use.    Past Medical History  Diagnosis Date  . Anxiety   . Depression   . COPD (chronic obstructive pulmonary disease)   . MS (multiple sclerosis)   . Fibromyalgia   . Migraine   . Impaired fasting glucose   . 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 Eating disorder   . Kidney stone   . Chronic low back pain     Past Surgical History  Procedure Laterality Date  . Abdominal hysterectomy  10 yrs ago    ferguson  . Cesarean section      x3  . Cervical fusion    . Cervical disc surgery      x2  . Tonsillectomy  20 yrs ago  . Cataract extraction w/phaco  06/20/2011    Procedure: CATARACT EXTRACTION PHACO AND INTRAOCULAR LENS PLACEMENT (IOC);  Surgeon: [name] T. Nile Riggs;  Location: [facility];  Service: Ophthalmology;  Laterality: Left;  CDE 1.81  . Cataract extraction w/phaco   07/04/2011    Procedure: CATARACT EXTRACTION PHACO AND INTRAOCULAR LENS PLACEMENT (IOC);  Surgeon: [name] T. Nile Riggs;  Location: [facility];  Service: Ophthalmology;  Laterality: Right;  CDE: 1.76  . Mastectomy    . Yag laser application Right 12/03/2012    Procedure: YAG LASER APPLICATION;  Surgeon: [name] T. Nile Riggs, MD;  Location: [facility];  Service: Ophthalmology;  Laterality: Right;  . Yag laser application Left 12/17/2012    Procedure: YAG LASER APPLICATION;  Surgeon: [name] T. Nile Riggs, MD;  Location: [facility];  Service: Ophthalmology;  Laterality: Left;  . Appendectomy    . Neck surgery      Family History  Problem Relation Age of Onset  . Anesthesia problems Neg Hx   . Hypotension Neg Hx   . Malignant hyperthermia Neg Hx   . Pseudochol deficiency Neg Hx   . Hyperlipidemia Mother   . Heart disease Mother   . Cancer Mother     lung  . Diabetes Father   . Heart disease Father   . Cancer Maternal Grandmother     breast    History  Substance Use Topics  . Smoking status: Current Every Day Smoker -- 1.00 packs/day for 30 years    Types: Cigarettes  . Smokeless tobacco: Not on file  . Alcohol  Use: No    No OB history provided.  Review of Systems  Constitutional: Negative for fever and chills.  HENT: Positive for neck pain (chronic).   Respiratory: Negative for shortness of breath.   Cardiovascular: Positive for leg swelling. Negative for chest pain.  Gastrointestinal: Negative for nausea, vomiting, abdominal pain and diarrhea.  Musculoskeletal: Back pain: chronic.  Neurological: Negative for syncope and headaches.  All other systems reviewed and are negative.    Allergies  Ceftin; Hctz; Lodine; Codeine; Latex; and Penicillins  Home Medications   Current Outpatient Rx  Name  Route  Sig  Dispense  Refill  . albuterol (PROVENTIL HFA;VENTOLIN HFA) 108 (90 BASE) MCG/ACT inhaler   Inhalation   Inhale 2 puffs into the lungs every 6 (six) hours as needed. COPD   1 Inhaler   4    . albuterol (PROVENTIL) (2.5 MG/3ML) 0.083% nebulizer solution   Nebulization   Take 2.5 mg by nebulization every 6 (six) hours as needed. For copd          . citalopram (CELEXA) 20 MG tablet   Oral   Take 1 tablet (20 mg total) by mouth daily.   30 tablet   2   . cyclobenzaprine (FLEXERIL) 10 MG tablet   Oral   Take 10 mg by mouth at bedtime.           . diazepam (VALIUM) 5 MG tablet   Oral   Take 1 tablet (5 mg total) by mouth at bedtime as needed for anxiety. Sleep/Muscle Relaxer   10 tablet   0   . esomeprazole (NEXIUM) 20 MG capsule   Oral   Take 20 mg by mouth daily as needed (Acid Reflux).          . furosemide (LASIX) 20 MG tablet               . gabapentin (NEURONTIN) 300 MG capsule   Oral   Take 300 mg by mouth 5 (five) times daily.         . meloxicam (MOBIC) 7.5 MG tablet   Oral   Take 7.5 mg by mouth daily.          . Multiple Vitamins-Calcium (ONE-A-DAY WOMENS PO)   Oral   Take 1 tablet by mouth daily.           Marland Kitchen oxyCODONE (ROXICODONE) 15 MG immediate release tablet   Oral   Take 15 mg by mouth every 4 (four) hours as needed for pain.         Marland Kitchen oxyCODONE-acetaminophen (PERCOCET) 10-325 MG per tablet   Oral   Take 1 tablet by mouth every 4 (four) hours as needed for pain (discontinue oxy 15mg  IR/ no greater than 5 per day.).   150 tablet   0   . potassium chloride SA (K-DUR,KLOR-CON) 20 MEQ tablet   Oral   Take 20 mEq by mouth 3 (three) times daily.          Marland Kitchen torsemide (DEMADEX) 20 MG tablet   Oral   Take 1 tablet (20 mg total) by mouth daily.   60 tablet   4     Triage Vitals: BP 94/53  Pulse 97  Temp(Src) 98.3 F (36.8 C) (Oral)  Resp 16  Ht 4\' 9"  (1.448 m)  Wt 176 lb (79.833 kg)  BMI 38.08 kg/m2  SpO2 90%  Physical Exam  Nursing note and vitals reviewed.  CONSTITUTIONAL: Well developed/well nourished HEAD: Normocephalic/atraumatic EYES: EOMI/PERRL ENMT: Mucous  membranes moist NECK: supple no meningeal  signs SPINE:entire spine nontender CV: S1/S2 noted, murmur noted LUNGS: Lungs are clear to auscultation bilaterally, no apparent distress ABDOMEN: soft, nontender, no rebound or guarding, she is obese NEURO: Pt is awake/alert, moves all extremitiesx4 EXTREMITIES: pulses normal, full ROM, chronic pitting edema to lower extremities  SKIN: warm, color normal PSYCH: no abnormalities of mood noted  ED Course  Procedures (including critical care time)  DIAGNOSTIC STUDIES: Oxygen Saturation is 90% on room air, low by my interpretation.    COORDINATION OF CARE: 9:28 PM-Discussed treatment plan which includes overnight stay for potassium with pt at bedside and pt agreed to plan. Advised pt that if she leaves, she could have seizures and other complications due to potassium level. D/w dr Orvan Falconer will admit for worsening dehydration/hyponatremia  Labs Reviewed  CBC WITH DIFFERENTIAL     MDM  Nursing notes including past medical history and social history reviewed and considered in documentation Labs/vital reviewed and considered       I personally performed the services described in this documentation, which was scribed in my presence. The recorded information has been reviewed and is accurate.      Joya Gaskins, MD 01/13/13 2146

## 2013-01-13 NOTE — Patient Instructions (Addendum)
Decrease the neurontin to one twice a day  Do labs today   Discontinue Oxy 15IR , use percocet 10/325 one up to 5 times a day  Follow up in 1 month

## 2013-01-13 NOTE — Progress Notes (Signed)
  Subjective:    Patient ID: [name], female    DOB: Sep 20, 1966, 47 y.o.   MRN: [id number]  HPI This patient comes in today because of severe swelling in her legs she also states decreased urination she also relates that she just doesn't feel good. She was last seen here in February and at that point in time we did evaluate her. It was felt that some of her leg swelling was related as a side effect from oxycodone as well as Neurontin. She also relates fatigue tiredness she has been under the care of a chronic pain management doctor in [place] but she no longer wants to go there she wants to reduce her medicine. She denies abusing her medicines. She does relate that she's been drinking a fair amount water recently. She states she is trying eat healthy.  Past medical histories include chronic low back pain fibromyalgia migraines COPD. We see medicine list family history pertinent for diabetes heart disease cholesterol breast cancer patient does smoke. She is disabled.   Review of Systems  Constitutional: Negative for activity change, appetite change and fatigue.  HENT: Negative for congestion, rhinorrhea, neck pain and ear discharge.   Eyes: Negative for discharge.  Respiratory: Negative for cough, chest tightness and wheezing.   Cardiovascular: Negative for chest pain.  Gastrointestinal: Negative for vomiting and abdominal pain.  Genitourinary: Negative for frequency and difficulty urinating.  Allergic/Immunologic: Negative for environmental allergies and food allergies.  Neurological: Negative for weakness and headaches.  Psychiatric/Behavioral: Negative for behavioral problems and agitation.      [id number] Objective:   Physical Exam  Constitutional: She is oriented to person, place, and time. She appears well-developed and well-nourished.  HENT:  Head: Normocephalic.  Right Ear: External ear normal.  Left Ear: External ear normal.  Eyes: Pupils are equal, round, and reactive to  light.  Neck: Normal range of motion. No thyromegaly present.  Cardiovascular: Normal rate, regular rhythm, normal heart sounds and intact distal pulses.   No murmur heard. Pulmonary/Chest: Effort normal and breath sounds normal. No respiratory distress. She has no wheezes.  Abdominal: Soft. Bowel sounds are normal. She exhibits no distension and no mass. There is no tenderness.  Musculoskeletal: Normal range of motion. She exhibits no edema and no tenderness.  Lymphadenopathy:    She has no cervical adenopathy.  Neurological: She is alert and oriented to person, place, and time. She exhibits normal muscle tone.  Skin: Skin is warm and dry.  Psychiatric: She has a normal mood and affect. Her behavior is normal.   Be noted that there is a typographical air above. She does have 2+ edema in her lower legs. Skin warm dry.       Assessment & Plan:  Pedal edema-continue diuretic. I believe that part of this is due to the Neurontin I recommend reducing it to 300 mg twice daily also believe it is partly related to the oxycodone 15 mg immediate release she is to stop this medicine In [address] Percocet 10 mg/325 one every 6 hours as needed for pain no given 5 in the day. #150 no refills. She is to followup in 4 weeks. In addition to this a repeat metabolic 7 was ordered. She was also encouraged to stop drinking so much water in in its place use liquids to have a little more sodium content. Lab work was ordered stat.

## 2013-01-13 NOTE — ED Notes (Signed)
O2 sat on room air dropped to 88%, seems to be dropping then returning to normal levels periodically. Placed on 2L [place] and now 96%

## 2013-01-13 NOTE — ED Notes (Signed)
Sent in by dr Lorin Picket lucking for recheck of a reported 120 sodium level today

## 2013-01-14 ENCOUNTER — Encounter (HOSPITAL_COMMUNITY): Payer: Self-pay | Admitting: Cardiology

## 2013-01-14 DIAGNOSIS — R609 Edema, unspecified: Secondary | ICD-10-CM | POA: Diagnosis not present

## 2013-01-14 DIAGNOSIS — E871 Hypo-osmolality and hyponatremia: Secondary | ICD-10-CM | POA: Diagnosis not present

## 2013-01-14 DIAGNOSIS — R600 Localized edema: Secondary | ICD-10-CM | POA: Diagnosis present

## 2013-01-14 DIAGNOSIS — Z720 Tobacco use: Secondary | ICD-10-CM | POA: Diagnosis present

## 2013-01-14 LAB — BASIC METABOLIC PANEL
BUN: 10 mg/dL (ref 6–23)
Calcium: 8.3 mg/dL — ABNORMAL LOW (ref 8.4–10.5)
Creatinine, Ser: 0.77 mg/dL (ref 0.50–1.10)
GFR calc Af Amer: >90 mL/min (ref >90)

## 2013-01-14 LAB — OSMOLALITY: Osmolality: 252 mOsm/kg — ABNORMAL LOW (ref 275–300)

## 2013-01-14 LAB — MAGNESIUM: Magnesium: 1.5 mg/dL (ref 1.5–2.5)

## 2013-01-14 LAB — CBC
HCT: 36.6 % (ref 36.0–46.0)
MCHC: 34.4 g/dL (ref 30.0–36.0)
Platelets: 350 10*3/uL (ref 150–400)
RDW: 13.4 % (ref 11.5–15.5)
WBC: 8 10*3/uL (ref 4.0–10.5)

## 2013-01-14 LAB — TSH: TSH: 2.884 u[IU]/mL (ref 0.350–4.500)

## 2013-01-14 MED ORDER — SODIUM CHLORIDE 0.9 % IJ SOLN
3.0000 mL | Freq: Two times a day (BID) | INTRAMUSCULAR | Status: DC
  Administered 2013-01-14 (×2): 3 mL via INTRAVENOUS

## 2013-01-14 MED ORDER — DIAZEPAM 5 MG PO TABS
5.0000 mg | ORAL_TABLET | Freq: Every evening | ORAL | Status: DC | PRN
  Administered 2013-01-14 (×2): 5 mg via ORAL
  Filled 2013-01-14 (×2): qty 1

## 2013-01-14 MED ORDER — ALBUTEROL SULFATE (5 MG/ML) 0.5% IN NEBU: 2.5000 mg | INHALATION_SOLUTION | Freq: Four times a day (QID) | RESPIRATORY_TRACT | Status: DC | PRN

## 2013-01-14 MED ORDER — POTASSIUM CHLORIDE IN NACL 20-0.9 MEQ/L-% IV SOLN
INTRAVENOUS | Status: DC
  Administered 2013-01-14 (×3): via INTRAVENOUS

## 2013-01-14 MED ORDER — GABAPENTIN 300 MG PO CAPS
300.0000 mg | ORAL_CAPSULE | Freq: Two times a day (BID) | ORAL | Status: DC
  Administered 2013-01-14 – 2013-01-15 (×4): 300 mg via ORAL
  Filled 2013-01-14 (×4): qty 1

## 2013-01-14 MED ORDER — SORBITOL 70 % SOLN: 30.0000 mL | Freq: Every day | Status: DC | PRN

## 2013-01-14 MED ORDER — CYCLOBENZAPRINE HCL 10 MG PO TABS
10.0000 mg | ORAL_TABLET | Freq: Every day | ORAL | Status: DC
  Administered 2013-01-14 (×2): 10 mg via ORAL
  Filled 2013-01-14 (×2): qty 1

## 2013-01-14 MED ORDER — ONDANSETRON HCL 4 MG PO TABS: 4.0000 mg | ORAL_TABLET | Freq: Four times a day (QID) | ORAL | Status: DC | PRN

## 2013-01-14 MED ORDER — OXYCODONE-ACETAMINOPHEN 5-325 MG PO TABS
2.0000 | ORAL_TABLET | ORAL | Status: DC | PRN
  Administered 2013-01-14 – 2013-01-15 (×6): 2 via ORAL
  Filled 2013-01-14 (×6): qty 2

## 2013-01-14 MED ORDER — CITALOPRAM HYDROBROMIDE 20 MG PO TABS
20.0000 mg | ORAL_TABLET | Freq: Every day | ORAL | Status: DC
  Administered 2013-01-14 – 2013-01-15 (×2): 20 mg via ORAL
  Filled 2013-01-14 (×2): qty 1

## 2013-01-14 MED ORDER — ENOXAPARIN SODIUM 40 MG/0.4ML ~~LOC~~ SOLN
40.0000 mg | SUBCUTANEOUS | Status: DC
  Administered 2013-01-14: 40 mg via SUBCUTANEOUS
  Filled 2013-01-14 (×3): qty 0.4

## 2013-01-14 MED ORDER — ONDANSETRON HCL 4 MG/2ML IJ SOLN: 4.0000 mg | Freq: Four times a day (QID) | INTRAMUSCULAR | Status: DC | PRN

## 2013-01-14 MED ORDER — NICOTINE 21 MG/24HR TD PT24
21.0000 mg | MEDICATED_PATCH | Freq: Every day | TRANSDERMAL | Status: DC | PRN
  Administered 2013-01-14: 21 mg via TRANSDERMAL
  Filled 2013-01-14 (×2): qty 1

## 2013-01-14 MED ORDER — FLEET ENEMA 7-19 GM/118ML RE ENEM: 1.0000 | ENEMA | Freq: Once | RECTAL | Status: AC | PRN

## 2013-01-14 MED ORDER — MELOXICAM 7.5 MG PO TABS
7.5000 mg | ORAL_TABLET | Freq: Every day | ORAL | Status: DC
  Filled 2013-01-14 (×2): qty 1

## 2013-01-14 MED ORDER — TRAZODONE HCL 50 MG PO TABS: 50.0000 mg | ORAL_TABLET | Freq: Every evening | ORAL | Status: DC | PRN

## 2013-01-14 NOTE — Progress Notes (Signed)
     Subjective: This lady was admitted yesterday with hyponatremia. She wants to go home this morning. I explained to her the reasons for her to still stay in the hospital, including the possibility of seizures with hyponatremia and death. She will think about this. She is a smoker but denies any cough or hemoptysis.           Physical Exam: Blood pressure 102/65, pulse 80, temperature 98.1 F (36.7 C), temperature source Oral, resp. rate 18, height 4\' 9"  (1.448 m), weight 78.019 kg (172 lb), SpO2 100.00%. She looks systemically well. She is obese. Heart sounds are present and normal. Lung fields are clear. There is no significant peripheral pitting edema.   Investigations:     Basic Metabolic Panel:  Recent Labs  16/10/96 2048 01/14/13 0027 01/14/13 0532  NA 121*  --  122*  K 4.3  --  3.8  CL 77*  --  81*  CO2 36*  --  35*  GLUCOSE 98  --  140*  BUN 10  --  10  CREATININE 0.76  --  0.77  CALCIUM 9.0  --  8.3*  MG  --  1.5  --    Liver Function Tests:  Recent Labs  01/13/13 2130  AST 35  ALT 19  ALKPHOS 175*  BILITOT 0.4  PROT 6.8  ALBUMIN 3.0*     CBC:  Recent Labs  01/13/13 2048 01/14/13 0532  WBC 11.7* 8.0  NEUTROABS 7.8*  --   HGB 14.0 12.6  HCT 39.6 36.6  MCV 93.0 94.1  PLT 383 350        Medications: I have reviewed the patient's current medications.  Impression: 1. Hyponatremia, multifactorial combination of diuretics and possibly nonsteroidal anti-inflammatory medications. There may be an underlying etiology for her hyponatremia also. TSH is being checked. 2. Bilateral leg edema,? Dependent.     Plan: 1. Continue with intravenous normal saline. 2. Await TSH results. 3. Patient has been counseled against leaving the hospital.     LOS: 1 day   Wilson Singer Pager [phone]  01/14/2013, 8:18 AM

## 2013-01-14 NOTE — Progress Notes (Signed)
Utilization Review Complete  

## 2013-01-14 NOTE — H&P (Addendum)
[facility] History and Physical  [name]  OZH:[id number]  DOB: 06-Feb-1966   DOA:01/14/2013   PCP:   Lilyan Punt, MD  Neurologist: Harlen Labs, M.D.  Chief Complaint:  Swelling legs for 2 weeks  HPI: [name] is an 47 y.o. female.  Small framed, Obese Caucasian lady with a history of chronic pain syndrome, and peripheral neuropathy on Neurontin for over 2 years, has also been taking Mobic and ibuprofen and recently started on Roxicodone, and 2 weeks ago developed swelling of the lower extremities, though her doctor's note indicates the swelling may have been developing at least since February.  2 weeks ago primary care physician discontinued the ibuprofen, continue the Mobic, and started her on Demadex 20 mg daily. Her Roxicodone was already discontinued in favor of Percocet.    One week later her be meds revealed her serum sodium fallen to 125, and a repeat today showed it is fallen further to 120, and patient was sent to [facility] emergency room for admission. Patient does rule out and to be admitted to this hospital.  She denies increasing thirst nausea or vomiting. She does drink about 3 x 20 oz bottles of water per day, and drinks soft drinks especially [facility]. She denies alcohol use.  She was not interested in quitting tobacco and feels that supports the local economy.  Her chronic mouth pain in her neck and back which radiate down both arms and both legs, apparently diagnosed by her neurologist has neuropathy of unclear etiology. Patient describes cause as "B12 deficiency" She denies anxiety or depression  Rewiew of Systems:   All systems negative except as marked bold or noted in the HPI;  Constitutional:    malaise, fever and chills. ;  Eyes:   eye pain, redness and discharge. ;  ENMT:   ear pain, hoarseness, nasal congestion, sinus pressure and sore throat. ;  Cardiovascular:    chest pain, palpitations, diaphoresis, dyspnea and peripheral edema.   Respiratory:   cough, hemoptysis, wheezing and stridor. ;  Gastrointestinal:  nausea, vomiting, diarrhea, constipation, abdominal pain, melena, blood in stool, hematemesis, jaundice and rectal bleeding. unusual weight loss..   Genitourinary:    frequency, dysuria, incontinence,flank pain and hematuria; Musculoskeletal:   Chronic back pain and neck pain.  swelling and trauma.;  Skin: .  pruritus, rash, abrasions, bruising and skin lesion.; ulcerations Neuro:    headache, lightheadedness and neck stiffness.  weakness, altered level of consciousness, altered mental status, extremity weakness, burning feet, involuntary movement, seizure and syncope.  Psych:    anxiety, depression, insomnia, tearfulness, panic attacks, hallucinations, paranoia, suicidal or homicidal ideation    Past Medical History  Diagnosis Date  . Anxiety   . Depression   . COPD (chronic obstructive pulmonary disease)   . MS (multiple sclerosis)   . Fibromyalgia   . Migraine   . Impaired fasting glucose   . 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 Eating disorder   . Kidney stone   . Chronic low back pain     Past Surgical History  Procedure Laterality Date  . Abdominal hysterectomy  10 yrs ago    ferguson  . Cesarean section      x3  . Cervical fusion    . Cervical disc surgery      x2  . Tonsillectomy  20 yrs ago  . Cataract extraction w/phaco  06/20/2011    Procedure: CATARACT EXTRACTION PHACO AND INTRAOCULAR LENS PLACEMENT (IOC);  Surgeon:  Mark T. Nile Riggs;  Location: [facility];  Service: Ophthalmology;  Laterality: Left;  CDE 1.81  . Cataract extraction w/phaco  07/04/2011    Procedure: CATARACT EXTRACTION PHACO AND INTRAOCULAR LENS PLACEMENT (IOC);  Surgeon: [name] T. Nile Riggs;  Location: [facility];  Service: Ophthalmology;  Laterality: Right;  CDE: 1.76  . Mastectomy    . Yag laser application Right 12/03/2012    Procedure: YAG LASER APPLICATION;  Surgeon: [name] T. Nile Riggs, MD;   Location: [facility];  Service: Ophthalmology;  Laterality: Right;  . Yag laser application Left 12/17/2012    Procedure: YAG LASER APPLICATION;  Surgeon: [name] T. Nile Riggs, MD;  Location: [facility];  Service: Ophthalmology;  Laterality: Left;  . Appendectomy    . Neck surgery      Medications:  HOME MEDS: Prior to Admission medications   Medication Sig Start Date End Date Taking? Authorizing Provider  albuterol (PROVENTIL HFA;VENTOLIN HFA) 108 (90 BASE) MCG/ACT inhaler Inhale 2 puffs into the lungs every 6 (six) hours as needed. COPD 12/30/12  Yes Babs Sciara, MD  citalopram (CELEXA) 20 MG tablet Take 1 tablet (20 mg total) by mouth daily. 01/07/13  Yes Babs Sciara, MD  cyclobenzaprine (FLEXERIL) 10 MG tablet Take 10 mg by mouth at bedtime.     Yes Historical Provider, MD  diazepam (VALIUM) 5 MG tablet Take 5 mg by mouth at bedtime as needed for anxiety or sleep. Sleep/Muscle Relaxer 07/31/11  Yes Hind I Elsaid, MD  gabapentin (NEURONTIN) 300 MG capsule Take 300 mg by mouth 2 (two) times daily.    Yes Historical Provider, MD  meloxicam (MOBIC) 7.5 MG tablet Take 7.5 mg by mouth daily.    Yes Historical Provider, MD  Multiple Vitamins-Calcium (ONE-A-DAY WOMENS PO) Take 1 tablet by mouth every morning.    Yes Historical Provider, MD  oxyCODONE-acetaminophen (PERCOCET) 10-325 MG per tablet Take 1 tablet by mouth every 4 (four) hours as needed for pain (discontinue oxy 15mg  IR/ no greater than 5 per day.). 01/13/13  Yes Babs Sciara, MD  potassium chloride SA (K-DUR,KLOR-CON) 20 MEQ tablet Take 20 mEq by mouth daily.    Yes Historical Provider, MD  torsemide (DEMADEX) 20 MG tablet Take 1 tablet (20 mg total) by mouth daily. 12/30/12  Yes Babs Sciara, MD  albuterol (PROVENTIL) (2.5 MG/3ML) 0.083% nebulizer solution Take 2.5 mg by nebulization every 6 (six) hours as needed. For copd     Historical Provider, MD     Allergies:  Allergies  Allergen Reactions  . Ceftin (Cefuroxime Axetil) Nausea And  Vomiting  . Hctz (Hydrochlorothiazide)     Urinary retention  . Lodine (Etodolac) Hives  . Roxicodone (Oxycodone Hcl Er) Swelling  . Codeine Rash  . Latex Rash  . Penicillins Rash    Social History:   reports that she has been smoking Cigarettes.  She has a 30 pack-year smoking history. She does not have any smokeless tobacco history on file. She reports that she does not drink alcohol or use illicit drugs.  Family History: Family History  Problem Relation Age of Onset  . Anesthesia problems Neg Hx   . Hypotension Neg Hx   . Malignant hyperthermia Neg Hx   . Pseudochol deficiency Neg Hx   . Hyperlipidemia Mother   . Heart disease Mother   . Cancer Mother     lung  . Diabetes Father   . Heart disease Father   . Cancer Maternal Grandmother     breast  Physical Exam: Filed Vitals:   01/13/13 2048 01/13/13 2210 01/13/13 2313 01/13/13 2315  BP: 94/53  113/57   Pulse: 97  71   Temp: 98.3 F (36.8 C)  97.2 F (36.2 C)   TempSrc: Oral  Oral   Resp: 16  16   Height: 4\' 9"  (1.448 m)   4\' 9"  (1.448 m)  Weight: 79.833 kg (176 lb)   78.926 kg (174 lb)  SpO2: 90% 96% 100%    Blood pressure 113/57, pulse 71, temperature 97.2 F (36.2 C), temperature source Oral, resp. rate 16, height 4\' 9"  (1.448 m), weight 78.926 kg (174 lb), SpO2 100.00%.  GEN:  Pleasant obese middle-age Caucasian lady lying bed, complaining frequently that she is leaving in the morning.; cooperative with exam PSYCH:  alert and oriented x4;  affect is appropriate. HEENT: Mucous membranes pink and anicteric; PERRLA; EOM intact; no cervical lymphadenopathy nor thyromegaly or carotid bruit; no JVD; multiple broken teeth and poor dentition, she relates to "B12 deficiency" Breasts:: Not examined CHEST WALL: No tenderness CHEST: Normal respiration, clear to auscultation bilaterally HEART: Regular rate and rhythm; 3/6 systolic murmur, no rubs or gallops BACK: No kyphosis no scoliosis; no CVA tenderness; tender  over the cervical and upper thoracic spine ABDOMEN: Obese, soft non-tender; no masses, no organomegaly, normal abdominal bowel sounds;  no intertriginous candida. Rectal Exam: Not done EXTREMITIES: age-appropriate arthropathy of the hands and knees;  trace bilateral  edema; no ulcerations. Genitalia: not examined PULSES: 2+ and symmetric SKIN: Healing bruises on the hands and forearm she says is a result of falls CNS: Cranial nerves 2-12 grossly intact no focal lateralizing neurologic deficit   Labs on Admission:  Basic Metabolic Panel:  Recent Labs Lab 01/08/13 0243 01/13/13 1519 01/13/13 2048  NA 125* 120* 121*  K 4.1 4.3 4.3  CL 82* 76* 77*  CO2 32 36* 36*  GLUCOSE 128* 101* 98  BUN 9 10 10   CREATININE 0.81 0.77 0.76  CALCIUM 8.7 9.3 9.0   Liver Function Tests:  Recent Labs Lab 01/13/13 2130  AST 35  ALT 19  ALKPHOS 175*  BILITOT 0.4  PROT 6.8  ALBUMIN 3.0*   No results found for this basename: LIPASE, AMYLASE,  in the last 168 hours No results found for this basename: AMMONIA,  in the last 168 hours CBC:  Recent Labs Lab 01/13/13 2048  WBC 11.7*  NEUTROABS 7.8*  HGB 14.0  HCT 39.6  MCV 93.0  PLT 383   Cardiac Enzymes: No results found for this basename: CKTOTAL, CKMB, CKMBINDEX, TROPONINI,  in the last 168 hours BNP: No components found with this basename: POCBNP,  D-dimer: No components found with this basename: D-DIMER,  CBG: No results found for this basename: GLUCAP,  in the last 168 hours  Radiological Exams on Admission: No results found.    Assessment/Plan Present on Admission:   . Hyponatremia, probably secondary to diuretic use  Discontinue diuretic; initiate hyponatremia workup, but will start normal saline IV pending blood and urine osmolality and sodium result; . Bilateral leg edema, probably related to NSAID use; possibly aggravated by Neurontin   discontinue NSAIDs; continue reduced dose and when necessary Percocet . Chronic  pain syndrome, multiple etiologies; narcotic dependence   Continue pain medications except NSAIDs  . Tobacco abuse, not willing to quit   Nicotine patch after counseling  . Reactive airway disease, COPD, acute or chronic tobacco smoking  When necessary albuterol  Hypo-albuminemia likely due to chronic illness  Obesity  If hyponatremia does not resolving, or lab work is not compatible with diuretic-induced hyponatremia, will consider other workup such as CT scan of the brain or chest x-ray has determined by lab results.   Code Status: FULL CODE  Family Communication: Discussed plans with patient; her boyfriend is present at bedside with her permission Disposition Plan: Likely home in a day or 2; she would prefer to be evaluated at [facility]    Dessire Grimes Nocturnist [facility] Pager [phone]   01/14/2013, 12:35 AM

## 2013-01-14 NOTE — Progress Notes (Signed)
INITIAL NUTRITION ASSESSMENT  DOCUMENTATION CODES Per approved criteria  -Obesity Class II   INTERVENTION: RD to follow for nutrition needs  NUTRITION DIAGNOSIS: Obesity related to energy intake exceeds requirements   as evidenced by BMI=37.   Goal: Pt to meet >/= 90% of their estimated nutrition needs  Monitor:  Po intake, labs and wt trends  Reason for Assessment: Malnurtriton Screen  47 y.o. female  Admitting Dx: Hyponatremia  ASSESSMENT: Pt has hyponatremia. She is also a smoker (30 yrs). Chronic pain, bilateral leg edema. Upper dentures.    Weight hx shows significant wt gain. Hx of eating disorder noted contributing factor to wt changes? Also, variability of wt may be at least partially related to edema and diuretic use. Pt denies recent changes to appetite.  She does not meet criteria for malnutrition. Nutrition- focused physical exam within normal limits.   Height: Ht Readings from Last 1 Encounters:  01/13/13 4\' 9"  (1.448 m)    Weight: Wt Readings from Last 1 Encounters:  01/14/13 172 lb (78.019 kg)    Ideal Body Weight: 95#  % Ideal Body Weight: 181%  Wt Readings from Last 10 Encounters:  01/14/13 172 lb (78.019 kg)  01/13/13 176 lb 12.8 oz (80.196 kg)  12/30/12 168 lb (76.204 kg)  07/25/11 120 lb 9.5 oz (54.7 kg)  07/23/11 130 lb (58.968 kg)  06/19/11 140 lb (63.504 kg)    Usual Body Weight: 165-170#  % Usual Body Weight: 101%  BMI:  Body mass index is 37.21 kg/(m^2).Obestiy Class II  Estimated Nutritional Needs: Kcal: 1350-1550  Protein: 65-75 gr Fluid: >1600 ml/day  Skin:No issues noted  Diet Order: General  EDUCATION NEEDS: -No education needs identified at this time   Intake/Output Summary (Last 24 hours) at 01/14/13 1019 Last data filed at 01/14/13 0510  Gross per 24 hour  Intake    120 ml  Output    400 ml  Net   -280 ml    Last BM: 01/12/13  Labs:   Recent Labs Lab 01/13/13 1519 01/13/13 2048 01/14/13 0027  01/14/13 0532  NA 120* 121*  --  122*  K 4.3 4.3  --  3.8  CL 76* 77*  --  81*  CO2 36* 36*  --  35*  BUN 10 10  --  10  CREATININE 0.77 0.76  --  0.77  CALCIUM 9.3 9.0  --  8.3*  MG  --   --  1.5  --   GLUCOSE 101* 98  --  140*    CBG (last 3)  No results found for this basename: GLUCAP,  in the last 72 hours  Scheduled Meds: . citalopram  20 mg Oral Daily  . cyclobenzaprine  10 mg Oral QHS  . enoxaparin (LOVENOX) injection  40 mg Subcutaneous Q24H  . gabapentin  300 mg Oral BID  . sodium chloride  3 mL Intravenous Q12H    Continuous Infusions: . 0.9 % NaCl with KCl 20 mEq / L 100 mL/hr at 01/14/13 0107    Past Medical History  Diagnosis Date  . Anxiety   . Depression   . COPD (chronic obstructive pulmonary disease)   . MS (multiple sclerosis)   . Fibromyalgia   . Migraine   . Impaired fasting glucose   . 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 Eating disorder   . Kidney stone   . Chronic low back pain     Past Surgical History  Procedure Laterality Date  .  Abdominal hysterectomy  10 yrs ago    ferguson  . Cesarean section      x3  . Cervical fusion    . Cervical disc surgery      x2  . Tonsillectomy  20 yrs ago  . Cataract extraction w/phaco  06/20/2011    Procedure: CATARACT EXTRACTION PHACO AND INTRAOCULAR LENS PLACEMENT (IOC);  Surgeon: [name] T. Nile Riggs;  Location: [facility];  Service: Ophthalmology;  Laterality: Left;  CDE 1.81  . Cataract extraction w/phaco  07/04/2011    Procedure: CATARACT EXTRACTION PHACO AND INTRAOCULAR LENS PLACEMENT (IOC);  Surgeon: [name] T. Nile Riggs;  Location: [facility];  Service: Ophthalmology;  Laterality: Right;  CDE: 1.76  . Yag laser application Right 12/03/2012    Procedure: YAG LASER APPLICATION;  Surgeon: [name] T. Nile Riggs, MD;  Location: [facility];  Service: Ophthalmology;  Laterality: Right;  . Yag laser application Left 12/17/2012    Procedure: YAG LASER APPLICATION;  Surgeon: [name] T. Nile Riggs, MD;   Location: [facility];  Service: Ophthalmology;  Laterality: Left;  . Appendectomy    . Neck surgery      Royann Shivers MS,RD,LDN,CSG Office: #[phone] Pager: [phone]

## 2013-01-15 DIAGNOSIS — R609 Edema, unspecified: Secondary | ICD-10-CM | POA: Diagnosis not present

## 2013-01-15 DIAGNOSIS — E871 Hypo-osmolality and hyponatremia: Secondary | ICD-10-CM | POA: Diagnosis not present

## 2013-01-15 DIAGNOSIS — G894 Chronic pain syndrome: Secondary | ICD-10-CM | POA: Diagnosis not present

## 2013-01-15 NOTE — Progress Notes (Signed)
UR Chart Review Completed  

## 2013-01-15 NOTE — Care Management Note (Signed)
    Page 1 of 1   01/15/2013     1:58:57 PM   CARE MANAGEMENT NOTE 01/15/2013  Patient:  [name], [name]   Account Number:  [id number]  Date Initiated:  01/14/2013  Documentation initiated by:  Rosemary Holms  Subjective/Objective Assessment:   Pt admitted from home for hyponatremia. No HH needs anticipated     Action/Plan:   Anticipated DC Date:  01/15/2013   Anticipated DC Plan:  HOME/SELF CARE      DC Planning Services  CM consult      Choice offered to / List presented to:             Status of service:  Completed, signed off Medicare Important Message given?   (If response is "NO", the following Medicare IM given date fields will be blank) Date Medicare IM given:   Date Additional Medicare IM given:    Discharge Disposition:  HOME/SELF CARE  Per UR Regulation:    If discussed at Long Length of Stay Meetings, dates discussed:    Comments:  01/15/13 Rosemary Holms RN BSN CM Spoke with pt. DC with no identified HH needs. Pt has a rolator and states she does not have DME needs  01/14/13 Rosemary Holms RN BSN CM

## 2013-01-15 NOTE — Discharge Summary (Signed)
Physician Discharge Summary  [name]:[id number] DOB: 12-29-1965 DOA: 01/13/2013  PCP: Lilyan Punt, MD  Admit date: 01/13/2013 Discharge date: 01/15/2013  Time spent: Greater than 30 minutes  Recommendations for Outpatient Follow-up:  1. Followup primary care physician in one week.   Discharge Diagnoses:  1. Hyponatremia secondary to combination of diuretics and gabapentin. 2. Tobacco abuse. 3. Chronic pain syndrome.   Discharge Condition: Stable.  Diet recommendation: Regular.  Filed Weights   01/13/13 2048 01/13/13 2315 01/14/13 0428  Weight: 79.833 kg (176 lb) 78.926 kg (174 lb) 78.019 kg (172 lb)    History of present illness:  This 47 year old lady presents to the hospital with symptoms of leg swelling for 2 weeks. Please see initial history as outlined below: [name] is an 47 y.o. female. Small framed, Obese Caucasian lady with a history of chronic pain syndrome, and peripheral neuropathy on Neurontin for over 2 years, has also been taking Mobic and ibuprofen and recently started on Roxicodone, and 2 weeks ago developed swelling of the lower extremities, though her doctor's note indicates the swelling may have been developing at least since February.  2 weeks ago primary care physician discontinued the ibuprofen, continue the Mobic, and started her on Demadex 20 mg daily. Her Roxicodone was already discontinued in favor of Percocet.  One week later her be meds revealed her serum sodium fallen to 125, and a repeat today showed it is fallen further to 120, and patient was sent to [facility] emergency room for admission. Patient does rule out and to be admitted to this hospital.  She denies increasing thirst nausea or vomiting. She does drink about 3 x 20 oz bottles of water per day, and drinks soft drinks especially [facility]. She denies alcohol use.  She was not interested in quitting tobacco and feels that supports the local economy.  Her chronic mouth pain in  her neck and back which radiate down both arms and both legs, apparently diagnosed by her neurologist has neuropathy of unclear etiology. Patient describes cause as "B12 deficiency"  She denies anxiety or depression  Hospital Course:  Patient was admitted and started on intravenous normal saline. Her sodium has slowly but surely improved. She has not shown any evidence of mental status changes. Her sodium is now 122. Her TSH was 2.88. In view of her smoking history, there is no indication from the symptoms that she has a malignant process going on in her lungs. I did not think at this time that radiological investigations were appropriate but if she should present with symptoms are worrisome, this certainly would be appropriate to do this by way of CT scan of her chest. In the meantime I would suggest that she be following up with her primary care physician and her sodium monitored closely. As far as her leg edema is concerned, I suggested leg elevation rather than diuretics. There is no indication clinically of heart failure to require diuretics. Gabapentin has been held for the time being. She is stable for discharge.  Procedures:  None.   Consultations:  None.  Discharge Exam: Filed Vitals:   01/14/13 1750 01/14/13 2104 01/15/13 0218 01/15/13 0609  BP: 105/66 99/60 90/52  107/70  Pulse: 95 64 95 102  Temp: 97.9 F (36.6 C) 98.5 F (36.9 C) 98 F (36.7 C) 97.6 F (36.4 C)  TempSrc: Oral Oral Oral Oral  Resp: 18 20 18 20   Height:      Weight:      SpO2:  92% 96% 93%     General: She looks systemically well. She is not clinically dehydrated. Cardiovascular: Heart sounds are present and normal without murmurs or added sounds. Respiratory: Lung fields are clear. She is alert and orientated with no evidence of delirium or any change in mental status. No focal neurological signs  Discharge Instructions  Discharge Orders   Future Appointments Provider Department Dept Phone   02/12/2013  1:10 PM Babs Sciara, MD [facility] [phone]   Future Orders Complete By Expires     Diet - low sodium heart healthy  As directed     Increase activity slowly  As directed         Medication List    STOP taking these medications       gabapentin 300 MG capsule  Commonly known as:  NEURONTIN     meloxicam 7.5 MG tablet  Commonly known as:  MOBIC     potassium chloride SA 20 MEQ tablet  Commonly known as:  K-DUR,KLOR-CON     torsemide 20 MG tablet  Commonly known as:  DEMADEX      TAKE these medications       albuterol (2.5 MG/3ML) 0.083% nebulizer solution  Commonly known as:  PROVENTIL  Take 2.5 mg by nebulization every 6 (six) hours as needed. For copd     albuterol 108 (90 BASE) MCG/ACT inhaler  Commonly known as:  PROVENTIL HFA;VENTOLIN HFA  Inhale 2 puffs into the lungs every 6 (six) hours as needed. COPD     citalopram 20 MG tablet  Commonly known as:  CELEXA  Take 1 tablet (20 mg total) by mouth daily.     cyclobenzaprine 10 MG tablet  Commonly known as:  FLEXERIL  Take 10 mg by mouth at bedtime.     diazepam 5 MG tablet  Commonly known as:  VALIUM  Take 5 mg by mouth at bedtime as needed for anxiety or sleep. Sleep/Muscle Relaxer     ONE-A-DAY WOMENS PO  Take 1 tablet by mouth every morning.     oxyCODONE-acetaminophen 10-325 MG per tablet  Commonly known as:  PERCOCET  Take 1 tablet by mouth every 4 (four) hours as needed for pain (discontinue oxy 15mg  IR/ no greater than 5 per day.).           Follow-up Information   Follow up with [name],[name], MD. Schedule an appointment as soon as possible for a visit in 1 week.   Contact information:   [address] [place] [address] [phone]        The results of significant diagnostics from this hospitalization (including imaging, microbiology, ancillary and laboratory) are listed below for reference.    Significant Diagnostic Studies: No results found.       Labs: Basic Metabolic Panel:  Recent Labs Lab 01/13/13 1519 01/13/13 2048 01/14/13 0027 01/14/13 0532  NA 120* 121*  --  122*  K 4.3 4.3  --  3.8  CL 76* 77*  --  81*  CO2 36* 36*  --  35*  GLUCOSE 101* 98  --  140*  BUN 10 10  --  10  CREATININE 0.77 0.76  --  0.77  CALCIUM 9.3 9.0  --  8.3*  MG  --   --  1.5  --    Liver Function Tests:  Recent Labs Lab 01/13/13 2130  AST 35  ALT 19  ALKPHOS 175*  BILITOT 0.4  PROT 6.8  ALBUMIN 3.0*  CBC:  Recent Labs Lab 01/13/13 2048 01/14/13 0532  WBC 11.7* 8.0  NEUTROABS 7.8*  --   HGB 14.0 12.6  HCT 39.6 36.6  MCV 93.0 94.1  PLT 383 350         Signed:  Rayder Sullenger C  [facility] 01/15/2013, 10:15 AM

## 2013-01-15 NOTE — Progress Notes (Signed)
Patient received discharge instructions along with follow up appointments and prescriptions. Patient verbalized understanding of all instructions. Patient was escorted by staff via wheelchair to vehicle. Patient discharged to home in stable condition. 

## 2013-01-22 ENCOUNTER — Ambulatory Visit: Payer: Medicare Other | Admitting: Family Medicine

## 2013-01-23 ENCOUNTER — Telehealth: Payer: Self-pay | Admitting: Family Medicine

## 2013-01-23 NOTE — Telephone Encounter (Signed)
Zofran 4 mg ODT. #24. One every 4-6 when necessary.

## 2013-01-23 NOTE — Telephone Encounter (Signed)
Med called into pharm and pt notified.

## 2013-01-23 NOTE — Telephone Encounter (Signed)
Pt has been sick to her stomach and vomiting x2 days, no diarrhea an pt not sure if fever present.  Wants to know if you can call in something for nausea at this time? [facility]

## 2013-02-08 ENCOUNTER — Encounter: Payer: Self-pay | Admitting: *Deleted

## 2013-02-11 ENCOUNTER — Encounter (HOSPITAL_COMMUNITY): Payer: Self-pay | Admitting: Emergency Medicine

## 2013-02-11 ENCOUNTER — Inpatient Hospital Stay (HOSPITAL_COMMUNITY)
Admission: EM | Admit: 2013-02-11 | Discharge: 2013-02-12 | DRG: 320 | Disposition: A | Discharge status: Home Health | Payer: BC Managed Care – PPO | Attending: Internal Medicine | Admitting: Internal Medicine

## 2013-02-11 DIAGNOSIS — Z833 Family history of diabetes mellitus: Secondary | ICD-10-CM

## 2013-02-11 DIAGNOSIS — F17200 Nicotine dependence, unspecified, uncomplicated: Secondary | ICD-10-CM | POA: Diagnosis present

## 2013-02-11 DIAGNOSIS — J449 Chronic obstructive pulmonary disease, unspecified: Secondary | ICD-10-CM | POA: Diagnosis present

## 2013-02-11 DIAGNOSIS — Z885 Allergy status to narcotic agent status: Secondary | ICD-10-CM | POA: Diagnosis not present

## 2013-02-11 DIAGNOSIS — Z6833 Body mass index (BMI) 33.0-33.9, adult: Secondary | ICD-10-CM

## 2013-02-11 DIAGNOSIS — F329 Major depressive disorder, single episode, unspecified: Secondary | ICD-10-CM | POA: Diagnosis not present

## 2013-02-11 DIAGNOSIS — F411 Generalized anxiety disorder: Secondary | ICD-10-CM | POA: Diagnosis present

## 2013-02-11 DIAGNOSIS — Z79899 Other long term (current) drug therapy: Secondary | ICD-10-CM

## 2013-02-11 DIAGNOSIS — G253 Myoclonus: Secondary | ICD-10-CM | POA: Diagnosis present

## 2013-02-11 DIAGNOSIS — G609 Hereditary and idiopathic neuropathy, unspecified: Secondary | ICD-10-CM | POA: Diagnosis present

## 2013-02-11 DIAGNOSIS — Z720 Tobacco use: Secondary | ICD-10-CM | POA: Diagnosis present

## 2013-02-11 DIAGNOSIS — F509 Eating disorder, unspecified: Secondary | ICD-10-CM | POA: Diagnosis present

## 2013-02-11 DIAGNOSIS — Z8249 Family history of ischemic heart disease and other diseases of the circulatory system: Secondary | ICD-10-CM

## 2013-02-11 DIAGNOSIS — E669 Obesity, unspecified: Secondary | ICD-10-CM | POA: Diagnosis present

## 2013-02-11 DIAGNOSIS — Z803 Family history of malignant neoplasm of breast: Secondary | ICD-10-CM | POA: Diagnosis not present

## 2013-02-11 DIAGNOSIS — F3289 Other specified depressive episodes: Secondary | ICD-10-CM | POA: Diagnosis present

## 2013-02-11 DIAGNOSIS — G894 Chronic pain syndrome: Secondary | ICD-10-CM | POA: Diagnosis present

## 2013-02-11 DIAGNOSIS — E871 Hypo-osmolality and hyponatremia: Secondary | ICD-10-CM | POA: Diagnosis not present

## 2013-02-11 DIAGNOSIS — R5383 Other fatigue: Secondary | ICD-10-CM

## 2013-02-11 DIAGNOSIS — E538 Deficiency of other specified B group vitamins: Secondary | ICD-10-CM | POA: Diagnosis present

## 2013-02-11 DIAGNOSIS — J4489 Other specified chronic obstructive pulmonary disease: Secondary | ICD-10-CM | POA: Diagnosis present

## 2013-02-11 DIAGNOSIS — G35 Multiple sclerosis: Secondary | ICD-10-CM | POA: Diagnosis present

## 2013-02-11 DIAGNOSIS — G43909 Migraine, unspecified, not intractable, without status migrainosus: Secondary | ICD-10-CM | POA: Diagnosis present

## 2013-02-11 DIAGNOSIS — R609 Edema, unspecified: Secondary | ICD-10-CM

## 2013-02-11 DIAGNOSIS — Z886 Allergy status to analgesic agent status: Secondary | ICD-10-CM | POA: Diagnosis not present

## 2013-02-11 DIAGNOSIS — Z880 Allergy status to penicillin: Secondary | ICD-10-CM | POA: Diagnosis not present

## 2013-02-11 DIAGNOSIS — T502X5A Adverse effect of carbonic-anhydrase inhibitors, benzothiadiazides and other diuretics, initial encounter: Secondary | ICD-10-CM | POA: Diagnosis present

## 2013-02-11 DIAGNOSIS — Z9181 History of falling: Secondary | ICD-10-CM

## 2013-02-11 DIAGNOSIS — Z981 Arthrodesis status: Secondary | ICD-10-CM

## 2013-02-11 DIAGNOSIS — Z91040 Latex allergy status: Secondary | ICD-10-CM

## 2013-02-11 DIAGNOSIS — M545 Low back pain, unspecified: Secondary | ICD-10-CM | POA: Diagnosis present

## 2013-02-11 DIAGNOSIS — Z801 Family history of malignant neoplasm of trachea, bronchus and lung: Secondary | ICD-10-CM

## 2013-02-11 DIAGNOSIS — E876 Hypokalemia: Secondary | ICD-10-CM | POA: Diagnosis not present

## 2013-02-11 DIAGNOSIS — R531 Weakness: Secondary | ICD-10-CM | POA: Diagnosis present

## 2013-02-11 DIAGNOSIS — N390 Urinary tract infection, site not specified: Secondary | ICD-10-CM | POA: Diagnosis not present

## 2013-02-11 DIAGNOSIS — IMO0001 Reserved for inherently not codable concepts without codable children: Secondary | ICD-10-CM | POA: Diagnosis present

## 2013-02-11 DIAGNOSIS — R5381 Other malaise: Secondary | ICD-10-CM

## 2013-02-11 DIAGNOSIS — R600 Localized edema: Secondary | ICD-10-CM

## 2013-02-11 HISTORY — DX: Vitamin B12 deficiency anemia, unspecified: D51.9

## 2013-02-11 HISTORY — DX: Unspecified chronic bronchitis: J42

## 2013-02-11 HISTORY — DX: Urinary tract infection, site not specified: N39.0

## 2013-02-11 HISTORY — DX: Gastro-esophageal reflux disease without esophagitis: K21.9

## 2013-02-11 HISTORY — DX: Unspecified osteoarthritis, unspecified site: M19.90

## 2013-02-11 HISTORY — DX: Nausea with vomiting, unspecified: R11.2

## 2013-02-11 HISTORY — DX: Chronic pain syndrome: G89.4

## 2013-02-11 HISTORY — DX: Other specified postprocedural states: Z98.890

## 2013-02-11 HISTORY — DX: Polyneuropathy, unspecified: G62.9

## 2013-02-11 HISTORY — DX: Prediabetes: R73.03

## 2013-02-11 HISTORY — DX: Edema, unspecified: R60.9

## 2013-02-11 LAB — BASIC METABOLIC PANEL
BUN: 18 mg/dL (ref 6–23)
Calcium: 8.3 mg/dL — ABNORMAL LOW (ref 8.4–10.5)
Chloride: 86 mEq/L — ABNORMAL LOW (ref 96–112)
Creatinine, Ser: 0.7 mg/dL (ref 0.50–1.10)
GFR calc Af Amer: >90 mL/min (ref >90)
GFR calc non Af Amer: >90 mL/min (ref >90)

## 2013-02-11 LAB — CBC WITH DIFFERENTIAL/PLATELET
Basophils Relative: 0 % (ref 0–1)
Eosinophils Absolute: 0 10*3/uL (ref 0.0–0.7)
Eosinophils Relative: 0 % (ref 0–5)
HCT: 40.5 % (ref 36.0–46.0)
Hemoglobin: 13.8 g/dL (ref 12.0–15.0)
MCH: 32.6 pg (ref 26.0–34.0)
MCHC: 34.1 g/dL (ref 30.0–36.0)
MCV: 95.7 fL (ref 78.0–100.0)
Monocytes Absolute: 0.9 10*3/uL (ref 0.1–1.0)
Monocytes Relative: 9 % (ref 3–12)
RDW: 17.2 % — ABNORMAL HIGH (ref 11.5–15.5)

## 2013-02-11 LAB — URINALYSIS, ROUTINE W REFLEX MICROSCOPIC
Ketones, ur: 15 mg/dL — AB
Nitrite: POSITIVE — AB
Protein, ur: 30 mg/dL — AB
pH: 5 (ref 5.0–8.0)

## 2013-02-11 LAB — URINE MICROSCOPIC-ADD ON

## 2013-02-11 MED ORDER — CIPROFLOXACIN HCL 500 MG PO TABS: 500.0000 mg | ORAL_TABLET | Freq: Two times a day (BID) | ORAL | Status: DC

## 2013-02-11 MED ORDER — SODIUM CHLORIDE 0.9 % IV BOLUS (SEPSIS)
500.0000 mL | Freq: Once | INTRAVENOUS | Status: AC
  Administered 2013-02-11: 500 mL via INTRAVENOUS

## 2013-02-11 MED ORDER — SODIUM CHLORIDE 0.9 % IV BOLUS (SEPSIS): 1000.0000 mL | Freq: Once | INTRAVENOUS | Status: DC

## 2013-02-11 MED ORDER — CIPROFLOXACIN IN D5W 400 MG/200ML IV SOLN
400.0000 mg | Freq: Once | INTRAVENOUS | Status: AC
  Administered 2013-02-11: 400 mg via INTRAVENOUS
  Filled 2013-02-11: qty 200

## 2013-02-11 MED ORDER — POTASSIUM CHLORIDE ER 10 MEQ PO TBCR
20.0000 meq | EXTENDED_RELEASE_TABLET | Freq: Every day | ORAL | Status: DC
  Administered 2013-02-11 – 2013-02-12 (×2): 20 meq via ORAL
  Filled 2013-02-11 (×2): qty 2

## 2013-02-11 MED ORDER — SODIUM CHLORIDE 0.9 % IV SOLN
INTRAVENOUS | Status: AC
  Administered 2013-02-11: via INTRAVENOUS

## 2013-02-11 MED ORDER — OXYCODONE-ACETAMINOPHEN 5-325 MG PO TABS
1.0000 | ORAL_TABLET | ORAL | Status: DC | PRN
  Administered 2013-02-11: 1 via ORAL
  Filled 2013-02-11 (×2): qty 1

## 2013-02-11 MED ORDER — OXYCODONE-ACETAMINOPHEN 10-325 MG PO TABS: 1.0000 | ORAL_TABLET | ORAL | Status: DC | PRN

## 2013-02-11 MED ORDER — ONDANSETRON HCL 4 MG/2ML IJ SOLN
4.0000 mg | Freq: Four times a day (QID) | INTRAMUSCULAR | Status: DC | PRN
  Administered 2013-02-12: 4 mg via INTRAVENOUS
  Filled 2013-02-11: qty 2

## 2013-02-11 MED ORDER — TORSEMIDE 20 MG PO TABS
20.0000 mg | ORAL_TABLET | Freq: Every morning | ORAL | Status: DC
  Administered 2013-02-12: 20 mg via ORAL
  Filled 2013-02-11: qty 1

## 2013-02-11 MED ORDER — SODIUM CHLORIDE 0.9 % IV BOLUS (SEPSIS)
1000.0000 mL | Freq: Once | INTRAVENOUS | Status: AC
  Administered 2013-02-11: 1000 mL via INTRAVENOUS

## 2013-02-11 MED ORDER — CYCLOBENZAPRINE HCL 10 MG PO TABS
10.0000 mg | ORAL_TABLET | Freq: Every day | ORAL | Status: DC
  Administered 2013-02-11: 10 mg via ORAL
  Filled 2013-02-11 (×2): qty 1

## 2013-02-11 MED ORDER — ACETAMINOPHEN 325 MG PO TABS: 650.0000 mg | ORAL_TABLET | Freq: Four times a day (QID) | ORAL | Status: DC | PRN

## 2013-02-11 MED ORDER — ENOXAPARIN SODIUM 40 MG/0.4ML ~~LOC~~ SOLN
40.0000 mg | SUBCUTANEOUS | Status: DC
  Administered 2013-02-11: 40 mg via SUBCUTANEOUS
  Filled 2013-02-11 (×2): qty 0.4

## 2013-02-11 MED ORDER — CIPROFLOXACIN IN D5W 400 MG/200ML IV SOLN
400.0000 mg | Freq: Two times a day (BID) | INTRAVENOUS | Status: DC
  Administered 2013-02-12: 400 mg via INTRAVENOUS
  Filled 2013-02-11 (×3): qty 200

## 2013-02-11 MED ORDER — ONDANSETRON HCL 4 MG PO TABS: 4.0000 mg | ORAL_TABLET | Freq: Four times a day (QID) | ORAL | Status: DC | PRN

## 2013-02-11 MED ORDER — POTASSIUM CHLORIDE 10 MEQ/100ML IV SOLN
10.0000 meq | Freq: Once | INTRAVENOUS | Status: AC
  Administered 2013-02-11: 10 meq via INTRAVENOUS
  Filled 2013-02-11: qty 100

## 2013-02-11 MED ORDER — ACETAMINOPHEN 650 MG RE SUPP: 650.0000 mg | Freq: Four times a day (QID) | RECTAL | Status: DC | PRN

## 2013-02-11 MED ORDER — NICOTINE 21 MG/24HR TD PT24
21.0000 mg | MEDICATED_PATCH | Freq: Every day | TRANSDERMAL | Status: DC
  Administered 2013-02-11: 21 mg via TRANSDERMAL
  Filled 2013-02-11 (×2): qty 1

## 2013-02-11 MED ORDER — ADULT MULTIVITAMIN W/MINERALS CH
1.0000 | ORAL_TABLET | Freq: Every day | ORAL | Status: DC
Start: 2013-02-12 — End: 2013-02-12
  Administered 2013-02-12: 1 via ORAL
  Filled 2013-02-11: qty 1

## 2013-02-11 MED ORDER — GABAPENTIN 300 MG PO CAPS
300.0000 mg | ORAL_CAPSULE | Freq: Three times a day (TID) | ORAL | Status: DC
  Administered 2013-02-11 – 2013-02-12 (×2): 300 mg via ORAL
  Filled 2013-02-11 (×4): qty 1

## 2013-02-11 MED ORDER — CITALOPRAM HYDROBROMIDE 20 MG PO TABS
20.0000 mg | ORAL_TABLET | Freq: Every day | ORAL | Status: DC
  Administered 2013-02-12: 20 mg via ORAL
  Filled 2013-02-11: qty 1

## 2013-02-11 MED ORDER — POTASSIUM CHLORIDE CRYS ER 20 MEQ PO TBCR
40.0000 meq | EXTENDED_RELEASE_TABLET | Freq: Once | ORAL | Status: AC
  Administered 2013-02-11: 40 meq via ORAL
  Filled 2013-02-11: qty 2

## 2013-02-11 MED ORDER — DIAZEPAM 5 MG PO TABS
5.0000 mg | ORAL_TABLET | Freq: Two times a day (BID) | ORAL | Status: DC
  Administered 2013-02-11 – 2013-02-12 (×2): 5 mg via ORAL
  Filled 2013-02-11 (×2): qty 1

## 2013-02-11 MED ORDER — ALBUTEROL SULFATE HFA 108 (90 BASE) MCG/ACT IN AERS
2.0000 | INHALATION_SPRAY | Freq: Four times a day (QID) | RESPIRATORY_TRACT | Status: DC | PRN
  Filled 2013-02-11: qty 6.7

## 2013-02-11 MED ORDER — OXYCODONE HCL 5 MG PO TABS: 5.0000 mg | ORAL_TABLET | ORAL | Status: DC | PRN

## 2013-02-11 NOTE — ED Provider Notes (Signed)
[name] is a 47 y.o. female who presents for evaluation of weakness. Her weakness has gotten so significant that her husband got her wheelchair 2 days ago to get her around the house. She also has had decreased appetite, with decreased oral intake. She denies urinary frequency, or dysuria. She last saw her PCP 2 weeks ago. She was due to see him in 2 days, and plans on seeing her neurologist soon to be reevaluated for multiple sclerosis  Exam alert, cooperative, and calm. Blood pressure at 19:10 is 87/55, this is after IV fluid treatment in the emergency department.  Abdomen soft and nontender. Musculoskeletal no deformities. Her grip strength is good in the hands, legs , strength 4/5 bilaterally.  Assessment: Global strength deficit, most likely related to toxic/metabolic, and infectious process more likely than multiple sclerosis. Patient will need to be admitted for management and treatment.  Medical screening examination/treatment/procedure(s) were conducted as a shared visit with non-physician practitioner(s) and myself.  I personally evaluated the patient during the encounter  Flint Melter, MD 02/12/13 [phone]

## 2013-02-11 NOTE — ED Notes (Addendum)
Pt to ED with c/o vomiting and generalized body pain. Pt also states she fall frequently. Pt states had neck surgery about 2-3 years ago for disc deterioration. Pt states she has fallen this morning aberration noted right leg.

## 2013-02-11 NOTE — H&P (Signed)
Patient's PCP: Lilyan Punt, MD Neurologist: Dr. Harlen Labs, [facility]  Chief Complaint: Generalized weakness  History of Present Illness: [name] is a 47 y.o. Caucasian  female with history of obesity, chronic pain syndrome, peripheral neuropathy on Neurontin, B12 deficiency, anxiety, depression, COPD, multiple sclerosis, fibromyalgia, and chronic low back pain who presents with the above complaints. Patient was recently hospitalized at [facility] from 01/13/2013 till 01/15/2013 with hyponatremia and lower extremity edema.  Patient reports that her generalized weakness has been a chronic issue for many years.  She has seen a neurologist at [facility] and was eventually told her weakness was due to B12 deficiency.  Patient improved on B12 replacement however after having eye surgery in February of 2014 she started noticing that she was getting weak again.she has had her medications adjusted including discontinuing her ibuprofen and Mobic by her primary care physician.  She was hospitalized for hyponatremia which is eventually resolved with IV hydration with normal saline.  Since going home she has had multiple falls on and off but has not lost consciousness.  She has been feeling nauseated over the last few days and has vomited once or twice.  She presented to the emergency department where she was found to have a urinary tract infection, the hospitalist service was asked to admit the patient for further care and management.  Patient admits to feeling feverish at home with chills but has not checked her temperature.  Denies any chest pain or shortness of breath.  Denies any abdominal pain or diarrhea.  Review of Systems: All systems reviewed with the patient and positive as per history of present illness, otherwise all other systems are negative.  Past Medical History  Diagnosis Date  . Anxiety   . Depression   . COPD (chronic obstructive pulmonary disease)    . MS (multiple sclerosis)   . Fibromyalgia   . Migraine   . Impaired fasting glucose   . 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 Eating disorder   . Kidney stone   . Chronic low back pain    Past Surgical History  Procedure Laterality Date  . Abdominal hysterectomy  10 yrs ago    ferguson  . Cesarean section      x3  . Cervical fusion    . Cervical disc surgery      x2  . Tonsillectomy  20 yrs ago  . Cataract extraction w/phaco  06/20/2011    Procedure: CATARACT EXTRACTION PHACO AND INTRAOCULAR LENS PLACEMENT (IOC);  Surgeon: [name] T. Nile Riggs;  Location: [facility];  Service: Ophthalmology;  Laterality: Left;  CDE 1.81  . Cataract extraction w/phaco  07/04/2011    Procedure: CATARACT EXTRACTION PHACO AND INTRAOCULAR LENS PLACEMENT (IOC);  Surgeon: [name] T. Nile Riggs;  Location: [facility];  Service: Ophthalmology;  Laterality: Right;  CDE: 1.76  . Yag laser application Right 12/03/2012    Procedure: YAG LASER APPLICATION;  Surgeon: [name] T. Nile Riggs, MD;  Location: [facility];  Service: Ophthalmology;  Laterality: Right;  . Yag laser application Left 12/17/2012    Procedure: YAG LASER APPLICATION;  Surgeon: [name] T. Nile Riggs, MD;  Location: [facility];  Service: Ophthalmology;  Laterality: Left;  . Appendectomy    . Neck surgery     Family History  Problem Relation Age of Onset  . Anesthesia problems Neg Hx   . Hypotension Neg Hx   . Malignant hyperthermia Neg Hx   . Pseudochol deficiency Neg Hx   .  Hyperlipidemia Mother   . Heart disease Mother   . Cancer Mother     lung  . Diabetes Father   . Heart disease Father   . Cancer Maternal Grandmother     breast   History   Social History  . Marital Status: Divorced    Spouse Name: N/A    Number of Children: N/A  . Years of Education: N/A   Occupational History  . Not on file.   Social History Main Topics  . Smoking status: Current Every Day Smoker -- 1.00 packs/day for 30 years    Types: Cigarettes  .  Smokeless tobacco: Not on file  . Alcohol Use: No  . Drug Use: No  . Sexually Active: Yes    Birth Control/ Protection: Surgical   Other Topics Concern  . Not on file   Social History Narrative  . No narrative on file   Allergies: Ceftin; Hctz; Hydrocodone; Lodine; Promethazine; Roxicodone; Codeine; Latex; and Penicillins  Home Meds: Prior to Admission medications   Medication Sig Start Date End Date Taking? Authorizing Provider  albuterol (PROVENTIL HFA;VENTOLIN HFA) 108 (90 BASE) MCG/ACT inhaler Inhale 2 puffs into the lungs every 6 (six) hours as needed. COPD 12/30/12  Yes Babs Sciara, MD  citalopram (CELEXA) 20 MG tablet Take 1 tablet (20 mg total) by mouth daily. 01/07/13  Yes Babs Sciara, MD  cyclobenzaprine (FLEXERIL) 10 MG tablet Take 10 mg by mouth at bedtime.     Yes Historical Provider, MD  diazepam (VALIUM) 5 MG tablet Take 5 mg by mouth 2 (two) times daily. Sleep/Muscle Relaxer 07/31/11  Yes Hind I Elsaid, MD  gabapentin (NEURONTIN) 300 MG capsule Take 300 mg by mouth 5 (five) times daily.   Yes Historical Provider, MD  Multiple Vitamins-Calcium (ONE-A-DAY WOMENS PO) Take 1 tablet by mouth every morning.    Yes Historical Provider, MD  oxyCODONE-acetaminophen (PERCOCET) 10-325 MG per tablet Take 1 tablet by mouth every 4 (four) hours as needed for pain (discontinue oxy 15mg  IR/ no greater than 5 per day.). 01/13/13  Yes Babs Sciara, MD  potassium chloride (K-DUR) 10 MEQ tablet Take 10 mEq by mouth daily.   Yes Historical Provider, MD  torsemide (DEMADEX) 20 MG tablet Take 20 mg by mouth every morning.   Yes Historical Provider, MD  ciprofloxacin (CIPRO) 500 MG tablet Take 1 tablet (500 mg total) by mouth 2 (two) times daily. 02/11/13   Emilia Beck, PA-C    Physical Exam: Blood pressure 105/59, pulse 108, temperature 98.9 F (37.2 C), temperature source Oral, resp. rate 19, SpO2 96.00%. General: Awake, Oriented x3, No acute distress. HEENT: EOMI, Moist mucous  membranes Neck: Supple CV: S1 and S2 Lungs: Clear to ascultation bilaterally Abdomen: Soft, Nontender, Nondistended, +bowel sounds. Ext: Good pulses. Trace edema. No clubbing or cyanosis noted. Neuro: Cranial Nerves II-XII grossly intact. Has 5/5 motor strength in upper and lower extremities.  Lab results:  Recent Labs  02/11/13 1249  NA 132*  K 2.8*  CL 86*  CO2 31  GLUCOSE 115*  BUN 18  CREATININE 0.70  CALCIUM 8.3*   No results found for this basename: AST, ALT, ALKPHOS, BILITOT, PROT, ALBUMIN,  in the last 72 hours No results found for this basename: LIPASE, AMYLASE,  in the last 72 hours  Recent Labs  02/11/13 1249  WBC 9.6  NEUTROABS 7.1  HGB 13.8  HCT 40.5  MCV 95.7  PLT 283   No results found for this basename:  CKTOTAL, CKMB, CKMBINDEX, TROPONINI,  in the last 72 hours No components found with this basename: POCBNP,  No results found for this basename: DDIMER,  in the last 72 hours No results found for this basename: HGBA1C,  in the last 72 hours No results found for this basename: CHOL, HDL, LDLCALC, TRIG, CHOLHDL, LDLDIRECT,  in the last 72 hours No results found for this basename: TSH, T4TOTAL, FREET3, T3FREE, THYROIDAB,  in the last 72 hours No results found for this basename: VITAMINB12, FOLATE, FERRITIN, TIBC, IRON, RETICCTPCT,  in the last 72 hours Imaging results:  No results found. Other results: EKG: sinus tachycardia with heart rate of 108, not significantly changed from previous EKG.  Assessment & Plan by Problem: Urinary tract infection Continue ciprofloxacin which was initiated in the emergency department.  Follow urine culture to just antibiotics depending on speciation and sensitivities.  Generalized weakness Acute on chronic.  No focal neurologic deficits on exam.  Likely related to urinary tract infection.  Request physical therapy consultation in the morning.  Discussed possibility of rehabilitation facility for physical therapy with  patient, patient reluctant at this time. Will check patient's TSH and free T4 in the morning.  Check vitamin B 12 levels. Patient's mentation is appropriate at this time. If patient does not improve with treating urinary tract infection and conservative management may consider further workup with perhaps neuro imaging studies given history of multiple sclerosis. Uncertain if polypharmacy could be contributing to her generalized weakness, decreased patient's gabapentin from 5 times a day to 3 times a day. Patient is also on Valium, cyclobenzaprine and Percocet.  Tobacco abuse Counseled on cessation.  Nicotine patch prescribed.  Chronic bilateral lower extremity edema Patient appears compensated at this time.  Continue torsemide.  Hypokalemia Replace as needed.  Likely due to diuretics. Check magnesium in the morning.  Hyponatremia Likely due to diuretics.  Will gently hydrate the patient on IV fluids.  Chronic pain syndrome Stable.  Prophylaxis Lovenox  CODE STATUS Full code.  This was discussed with patient and husband at the time of admission.  Disposition Admit the patient as inpatient to MedSurg.  Time spent on admission, talking to the patient, and coordinating care was: 60 mins.  Alby Schwabe A, MD 02/11/2013, 9:16 PM

## 2013-02-11 NOTE — ED Provider Notes (Signed)
Patient care assumed from [facility], PA-C at shift change for dispo. Patient presented to the ED for generalized weakness that has been gradually worsening over the last 2 weeks. Patient was found to be hypokalemic with a potassium of 2.8. Urinalysis also significant for urinary tract infection. IV Cipro ordered for treatment. During hospital stay patient has been tachycardic and hypotensive after IV fluid hydration. Patient seen and evaluated, also, by Dr. Effie Shy; global strength deficit appreciated without evidence of focal neurologic deficits. Consult placed to internal medicine for admission.  I have consulted with Dr. Betti Cruz who has agreed to admit the patient for further management of urinary tract infection and hypokalemia. EKG ordered, as requested by Dr. Betti Cruz, and patient will be managed until brought to the floor.    Date: 02/11/2013  Rate: 108  Rhythm: sinus tachycardia  QRS Axis: normal  Intervals: normal  ST/T Wave abnormalities: normal  Conduction Disutrbances:none  Narrative Interpretation: NSR without STEMI or ischemic changes  Old EKG Reviewed: none available I have personally reviewed and interpreted his EKG.    Results for orders placed during the hospital encounter of 02/11/13  CBC WITH DIFFERENTIAL      Result Value Range   WBC 9.6  4.0 - 10.5 K/uL   RBC 4.23  3.87 - 5.11 MIL/uL   Hemoglobin 13.8  12.0 - 15.0 g/dL   HCT 16.1  09.6 - 04.5 %   MCV 95.7  78.0 - 100.0 fL   MCH 32.6  26.0 - 34.0 pg   MCHC 34.1  30.0 - 36.0 g/dL   RDW 40.9 (*) 81.1 - 91.4 %   Platelets 283  150 - 400 K/uL   Neutrophils Relative 74  43 - 77 %   Neutro Abs 7.1  1.7 - 7.7 K/uL   Lymphocytes Relative 17  12 - 46 %   Lymphs Abs 1.6  0.7 - 4.0 K/uL   Monocytes Relative 9  3 - 12 %   Monocytes Absolute 0.9  0.1 - 1.0 K/uL   Eosinophils Relative 0  0 - 5 %   Eosinophils Absolute 0.0  0.0 - 0.7 K/uL   Basophils Relative 0  0 - 1 %   Basophils Absolute 0.0  0.0 - 0.1 K/uL  BASIC  METABOLIC PANEL      Result Value Range   Sodium 132 (*) 135 - 145 mEq/L   Potassium 2.8 (*) 3.5 - 5.1 mEq/L   Chloride 86 (*) 96 - 112 mEq/L   CO2 31  19 - 32 mEq/L   Glucose, Bld 115 (*) 70 - 99 mg/dL   BUN 18  6 - 23 mg/dL   Creatinine, Ser 7.82  0.50 - 1.10 mg/dL   Calcium 8.3 (*) 8.4 - 10.5 mg/dL   GFR calc non Af Amer >90  >90 mL/min   GFR calc Af Amer >90  >90 mL/min  URINALYSIS, ROUTINE W REFLEX MICROSCOPIC      Result Value Range   Color, Urine AMBER (*) YELLOW   APPearance CLOUDY (*) CLEAR   Specific Gravity, Urine 1.031 (*) 1.005 - 1.030   pH 5.0  5.0 - 8.0   Glucose, UA NEGATIVE  NEGATIVE mg/dL   Hgb urine dipstick SMALL (*) NEGATIVE   Bilirubin Urine LARGE (*) NEGATIVE   Ketones, ur 15 (*) NEGATIVE mg/dL   Protein, ur 30 (*) NEGATIVE mg/dL   Urobilinogen, UA 1.0  0.0 - 1.0 mg/dL   Nitrite POSITIVE (*) NEGATIVE   Leukocytes, UA SMALL (*)  NEGATIVE  URINE MICROSCOPIC-ADD ON      Result Value Range   Squamous Epithelial / LPF RARE  RARE   WBC, UA 0-2  <3 WBC/hpf   RBC / HPF 0-2  <3 RBC/hpf   Bacteria, UA FEW (*) RARE   Casts HYALINE CASTS (*) NEGATIVE   Urine-Other MUCOUS PRESENT       Antony Madura, PA-C 02/11/13 2027

## 2013-02-11 NOTE — ED Notes (Addendum)
Spoke with case Manager  Marcelle Smiling for concern about the pt. Pt not able to care for self. Pt is extremely weak. She can't sit, stand or holding any to her mouth. Case manager recommend to place in [facility] nursing facility.

## 2013-02-11 NOTE — ED Provider Notes (Signed)
History     CSN: [id number]  Arrival date & time 02/11/13  1210   First MD Initiated Contact with Patient 02/11/13 1247      Chief Complaint  Patient presents with  . Emesis    (Consider location/radiation/quality/duration/timing/severity/associated sxs/prior treatment) HPI Comments: Patient is a 47 year old female with a past medical history of chronic pain syndrome who presents with a 2 week history of weakness. Symptoms started gradually and progressively worsened since the onset. Patient reports being unable to walk or stand which she normally does. Patient was admitted 1 month ago for hyponatremia. Patients started feeling weak again after she was discharged from the hospital. No aggravating/alleviating factors. Patient reports associated nausea and vomiting.    Past Medical History  Diagnosis Date  . Anxiety   . Depression   . COPD (chronic obstructive pulmonary disease)   . MS (multiple sclerosis)   . Fibromyalgia   . Migraine   . Impaired fasting glucose   . 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 Eating disorder   . Kidney stone   . Chronic low back pain     Past Surgical History  Procedure Laterality Date  . Abdominal hysterectomy  10 yrs ago    ferguson  . Cesarean section      x3  . Cervical fusion    . Cervical disc surgery      x2  . Tonsillectomy  20 yrs ago  . Cataract extraction w/phaco  06/20/2011    Procedure: CATARACT EXTRACTION PHACO AND INTRAOCULAR LENS PLACEMENT (IOC);  Surgeon: [name] T. Nile Riggs;  Location: [facility];  Service: Ophthalmology;  Laterality: Left;  CDE 1.81  . Cataract extraction w/phaco  07/04/2011    Procedure: CATARACT EXTRACTION PHACO AND INTRAOCULAR LENS PLACEMENT (IOC);  Surgeon: [name] T. Nile Riggs;  Location: [facility];  Service: Ophthalmology;  Laterality: Right;  CDE: 1.76  . Yag laser application Right 12/03/2012    Procedure: YAG LASER APPLICATION;  Surgeon: [name] T. Nile Riggs, MD;  Location: [facility];   Service: Ophthalmology;  Laterality: Right;  . Yag laser application Left 12/17/2012    Procedure: YAG LASER APPLICATION;  Surgeon: [name] T. Nile Riggs, MD;  Location: [facility];  Service: Ophthalmology;  Laterality: Left;  . Appendectomy    . Neck surgery      Family History  Problem Relation Age of Onset  . Anesthesia problems Neg Hx   . Hypotension Neg Hx   . Malignant hyperthermia Neg Hx   . Pseudochol deficiency Neg Hx   . Hyperlipidemia Mother   . Heart disease Mother   . Cancer Mother     lung  . Diabetes Father   . Heart disease Father   . Cancer Maternal Grandmother     breast    History  Substance Use Topics  . Smoking status: Current Every Day Smoker -- 1.00 packs/day for 30 years    Types: Cigarettes  . Smokeless tobacco: Not on file  . Alcohol Use: No    OB History   Grav Para Term Preterm Abortions TAB SAB Ect Mult Living                  Review of Systems  Gastrointestinal: Positive for nausea.  Neurological: Positive for weakness.  All other systems reviewed and are negative.    Allergies  Ceftin; Hctz; Hydrocodone; Lodine; Roxicodone; Codeine; Latex; and Penicillins  Home Medications   Current Outpatient Rx  Name  Route  Sig  Dispense  Refill  . albuterol (PROVENTIL HFA;VENTOLIN HFA) 108 (90 BASE) MCG/ACT inhaler   Inhalation   Inhale 2 puffs into the lungs every 6 (six) hours as needed. COPD   1 Inhaler   4   . albuterol (PROVENTIL) (2.5 MG/3ML) 0.083% nebulizer solution   Nebulization   Take 2.5 mg by nebulization every 6 (six) hours as needed. For copd          . citalopram (CELEXA) 20 MG tablet   Oral   Take 1 tablet (20 mg total) by mouth daily.   30 tablet   2   . Cyanocobalamin (VITAMIN B-12 IJ)   Injection   Inject as directed.         . cyclobenzaprine (FLEXERIL) 10 MG tablet   Oral   Take 10 mg by mouth at bedtime.           . diazepam (VALIUM) 5 MG tablet   Oral   Take 5 mg by mouth 2 (two) times daily.  Sleep/Muscle Relaxer         . gabapentin (NEURONTIN) 300 MG capsule   Oral   Take 300 mg by mouth 5 (five) times daily.         . meloxicam (MOBIC) 15 MG tablet   Oral   Take 15 mg by mouth daily.         . Multiple Vitamins-Calcium (ONE-A-DAY WOMENS PO)   Oral   Take 1 tablet by mouth every morning.          Marland Kitchen oxyCODONE-acetaminophen (PERCOCET) 10-325 MG per tablet   Oral   Take 1 tablet by mouth every 4 (four) hours as needed for pain (discontinue oxy 15mg  IR/ no greater than 5 per day.).   150 tablet   0   . potassium chloride (K-DUR) 10 MEQ tablet   Oral   Take 10 mEq by mouth daily.         Marland Kitchen torsemide (DEMADEX) 20 MG tablet   Oral   Take 20 mg by mouth every morning.           BP 95/56  Pulse 108  Temp(Src) 99 F (37.2 C) (Oral)  Resp 12  SpO2 96%  Physical Exam  Nursing note and vitals reviewed. Constitutional: She is oriented to person, place, and time. She appears well-developed and well-nourished. No distress.  HENT:  Head: Normocephalic and atraumatic.  Eyes: Conjunctivae are normal.  Neck: Normal range of motion.  Cardiovascular: Normal rate and regular rhythm.  Exam reveals no gallop and no friction rub.   No murmur heard. Pulmonary/Chest: Effort normal and breath sounds normal. She has no wheezes. She has no rales. She exhibits no tenderness.  Abdominal: Soft. There is no tenderness.  Musculoskeletal: Normal range of motion.  Neurological: She is alert and oriented to person, place, and time. Coordination normal.  Speech is goal-oriented. Moves limbs without ataxia.   Skin: Skin is warm and dry.  Psychiatric: She has a normal mood and affect. Her behavior is normal.    ED Course  Procedures (including critical care time)  Labs Reviewed  CBC WITH DIFFERENTIAL - Abnormal; Notable for the following:    RDW 17.2 (*)    All other components within normal limits  BASIC METABOLIC PANEL - Abnormal; Notable for the following:    Sodium  132 (*)    Potassium 2.8 (*)    Chloride 86 (*)    Glucose, Bld 115 (*)    Calcium 8.3 (*)  All other components within normal limits  URINALYSIS, ROUTINE W REFLEX MICROSCOPIC - Abnormal; Notable for the following:    Color, Urine AMBER (*)    APPearance CLOUDY (*)    Specific Gravity, Urine 1.031 (*)    Hgb urine dipstick SMALL (*)    Bilirubin Urine LARGE (*)    Ketones, ur 15 (*)    Protein, ur 30 (*)    Nitrite POSITIVE (*)    Leukocytes, UA SMALL (*)    All other components within normal limits  URINE MICROSCOPIC-ADD ON - Abnormal; Notable for the following:    Bacteria, UA FEW (*)    Casts HYALINE CASTS (*)    All other components within normal limits  BASIC METABOLIC PANEL - Abnormal; Notable for the following:    Sodium 131 (*)    Chloride 92 (*)    Calcium 7.6 (*)    All other components within normal limits  CBC - Abnormal; Notable for the following:    RDW 17.2 (*)    All other components within normal limits  VITAMIN B12 - Abnormal; Notable for the following:    Vitamin B-12 1052 (*)    All other components within normal limits  URINE CULTURE  TSH  T4, FREE  MAGNESIUM   No results found.   1. UTI (urinary tract infection)   2. Hypokalemia   3. Weakness   4. Bilateral leg edema   5. Generalized weakness       MDM  1:32 PM Labs and urinalysis pending.   2:50 PM Labs unremarkable. Urinalysis shows UTI.   Patient signed out to Antony Madura, PA-C.      Emilia Beck, PA-C 02/12/13 [phone]

## 2013-02-12 ENCOUNTER — Other Ambulatory Visit: Payer: Self-pay | Admitting: Family Medicine

## 2013-02-12 ENCOUNTER — Telehealth: Payer: Self-pay | Admitting: Family Medicine

## 2013-02-12 ENCOUNTER — Ambulatory Visit: Payer: Medicare Other | Admitting: Family Medicine

## 2013-02-12 DIAGNOSIS — G894 Chronic pain syndrome: Secondary | ICD-10-CM

## 2013-02-12 LAB — BASIC METABOLIC PANEL
CO2: 26 mEq/L (ref 19–32)
Chloride: 92 mEq/L — ABNORMAL LOW (ref 96–112)
Creatinine, Ser: 0.57 mg/dL (ref 0.50–1.10)
Glucose, Bld: 98 mg/dL (ref 70–99)

## 2013-02-12 LAB — CBC
HCT: 39.6 % (ref 36.0–46.0)
MCH: 33.3 pg (ref 26.0–34.0)
MCV: 96.1 fL (ref 78.0–100.0)
RDW: 17.2 % — ABNORMAL HIGH (ref 11.5–15.5)
WBC: 8.9 10*3/uL (ref 4.0–10.5)

## 2013-02-12 LAB — URINE CULTURE

## 2013-02-12 LAB — T4, FREE: Free T4: 1.35 ng/dL (ref 0.80–1.80)

## 2013-02-12 MED ORDER — CIPROFLOXACIN HCL 500 MG PO TABS: 500.0000 mg | ORAL_TABLET | Freq: Two times a day (BID) | ORAL | Status: DC

## 2013-02-12 MED ORDER — ENSURE PUDDING PO PUDG
1.0000 | Freq: Three times a day (TID) | ORAL | Status: DC
  Administered 2013-02-12: 1 via ORAL

## 2013-02-12 MED ORDER — GABAPENTIN 300 MG PO CAPS: 300.0000 mg | ORAL_CAPSULE | Freq: Three times a day (TID) | ORAL | Status: DC

## 2013-02-12 MED ORDER — DIAZEPAM 5 MG PO TABS: 5.0000 mg | ORAL_TABLET | Freq: Two times a day (BID) | ORAL | Status: DC | PRN

## 2013-02-12 NOTE — Clinical Social Work Note (Signed)
Patient discharging home today and needs ambulance transport as she is very weak. CSW facilitated ambulance transport home. Patient aware and agreeable to ambulance transport.  Genelle Bal, MSW, LCSW [phone]

## 2013-02-12 NOTE — Care Management Note (Signed)
   CARE MANAGEMENT NOTE 02/12/2013  Patient:  [name], [name]   Account Number:  [id number]  Date Initiated:  02/12/2013  Documentation initiated by:  Destiny Trickey  Subjective/Objective Assessment:   Pt for d/c to home will need HHPT and HHOT. Family to assist with home care.     Action/Plan:   Met with pt and husband, plan to d/c to home today, pt selected [facility] for [facility] services, she has used this agency previously.   Anticipated DC Date:  02/12/2013   Anticipated DC Plan:  HOME W HOME HEALTH SERVICES         [facility] Choice  HOME HEALTH   Choice offered to / List presented to:  C-1 Patient        HH arranged  HH-2 PT  HH-3 OT      [facility] agency  [facility]   Status of service:  Completed, signed off Medicare Important Message given?   (If response is "NO", the following Medicare IM given date fields will be blank) Date Medicare IM given:   Date Additional Medicare IM given:    Discharge Disposition:  HOME W HOME HEALTH SERVICES  Per UR Regulation:    If discussed at Long Length of Stay Meetings, dates discussed:    Comments:

## 2013-02-12 NOTE — Progress Notes (Addendum)
PT Cancellation Note  Patient Details Name: [name] MRN: [id number] DOB: 1966/04/14   Cancelled Treatment:    Reason Eval/Treat Not Completed: Other (comment) (Pt discharged prior to PT/OT evaluations) Spoke with pt and spouse and they declined acute PT/OT evaluation stating that [facility] PT/OT has already been ordered.  Acute PT/OT signing off.     Jasiri Hanawalt 02/12/2013, 12:42 PM Frona Yost L. Kadarius Cuffe DPT [phone]

## 2013-02-12 NOTE — Progress Notes (Signed)
  OT Cancellation Note  Treatment cancelled today due to pt discharged and has declined OT eval.  HHOT has been ordered for home. Signing off.  02/12/2013 Cipriano Mile OTR/L Pager [phone] Office [phone]

## 2013-02-12 NOTE — Telephone Encounter (Signed)
May do a prescription for bedside commode reason ataxia and weakness.

## 2013-02-12 NOTE — Progress Notes (Signed)
Attempted to transfer pt from bed into chair. With two person assist, pt barely made it to the chair. Pt is adamant about going home. Pt will need to transfer home via ambulance, as it will not be safe for pt to travel home with her boyfriend. Social worker made aware.

## 2013-02-12 NOTE — Telephone Encounter (Signed)
Rx faxed to [facility]

## 2013-02-12 NOTE — Progress Notes (Addendum)
Discussed discharge instructions and medications with pt. Pt showed no barriers to discharge. IV removed. Tele removed. Pt to discharge home with boyfriend via ambulance, as pt is too weak to ambulate, even with two person assist. Encouraged pt to stay in hospital to receive PT, but pt refused. Assessment unchanged from morning.

## 2013-02-12 NOTE — Progress Notes (Signed)
INITIAL NUTRITION ASSESSMENT  DOCUMENTATION CODES Per approved criteria  -Obesity Unspecified   INTERVENTION: 1. Recommend Ensure Pudding with medications to promote additional protein and kcal intake. 2. Please provide assistance with meals 3. RD to continue to follow nutrition care plan  NUTRITION DIAGNOSIS: Self-feeding difficulty related to progressive MS as evidenced by family report.   Goal: Intake to meet >90% of estimated nutrition needs.  Monitor:  weight trends, lab trends, I/O's, PO intake, supplement tolerance  Reason for Assessment: Low Braden  47 y.o. female  Admitting Dx: UTI (urinary tract infection)  ASSESSMENT: Hx of obesity, chronic pain syndrome, MS, fibromyalgia. Recently seen by neurologist at [facility] with work-up of weakness 2/2 vitamin B12 deficiency. Admitted with ongoing weakness, s/p multiple falls, difficulty caring for self. Per RN, family reports pt is not unable to feed self. OT eval in place at this time, has yet to see pt.  RN denies any issues with swallowing at this time.   RD obtained nutrition hx from patient. She states that she eats 2 meals daily (skips breakfast) with the addition of snacks in between meals. She states that she usually weighs 150 lb - this is consistent with current weight. Denies any issues swallowing. Does not take Ensure or Boost.  Eating 75% of meals. Current Braden score is 12 and is low 2/2 mobility and activity. No wounds but at risk for skin breakdown.  Height: Ht Readings from Last 1 Encounters:  01/13/13 4\' 9"  (1.448 m)    Weight: Wt Readings from Last 1 Encounters:  02/11/13 153 lb 8 oz (69.627 kg)    Ideal Body Weight: 95 lb  % Ideal Body Weight: 161%  Wt Readings from Last 10 Encounters:  02/11/13 153 lb 8 oz (69.627 kg)  01/14/13 172 lb (78.019 kg)  01/13/13 176 lb 12.8 oz (80.196 kg)  12/30/12 168 lb (76.204 kg)  07/25/11 120 lb 9.5 oz (54.7 kg)  07/23/11 130 lb (58.968 kg)  06/19/11  140 lb (63.504 kg)    Usual Body Weight: 150 lb  % Usual Body Weight: 102%  BMI:  Body mass index is 33.21 kg/(m^2). Obese Class I.  Estimated Nutritional Needs: Kcal: 1450 - 1650 kcal Protein: 75 - 85 grams Fluid: 1.5 - 1.7 liters daily  Skin: intact  Diet Order: Cardiac  EDUCATION NEEDS: -No education needs identified at this time  No intake or output data in the 24 hours ending 02/12/13 0930  Last BM: per pt, more than 1-2 weeks ago  Labs:   Recent Labs Lab 02/11/13 1249 02/11/13 2144 02/12/13 0430  NA 132*  --  131*  K 2.8*  --  3.5  CL 86*  --  92*  CO2 31  --  26  BUN 18  --  13  CREATININE 0.70  --  0.57  CALCIUM 8.3*  --  7.6*  MG  --  1.8  --   GLUCOSE 115*  --  98    CBG (last 3)  No results found for this basename: GLUCAP,  in the last 72 hours  Scheduled Meds: . ciprofloxacin  400 mg Intravenous Q12H  . citalopram  20 mg Oral Daily  . cyclobenzaprine  10 mg Oral QHS  . diazepam  5 mg Oral BID  . enoxaparin (LOVENOX) injection  40 mg Subcutaneous Q24H  . gabapentin  300 mg Oral TID  . multivitamin with minerals  1 tablet Oral Daily  . nicotine  21 mg Transdermal QHS  .  potassium chloride  20 mEq Oral Daily  . torsemide  20 mg Oral q morning - 10a    Continuous Infusions: . sodium chloride 75 mL/hr at 02/11/13 2336    Past Medical History  Diagnosis Date  . Anxiety   . Depression   . COPD (chronic obstructive pulmonary disease)   . MS (multiple sclerosis)   . Fibromyalgia   . Impaired fasting glucose   . 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 Eating disorder   . Chronic low back pain   . PONV (postoperative nausea and vomiting)   . Chronic bronchitis     "yearly" (02/11/2013)  . Borderline diabetes   . GERD (gastroesophageal reflux disease)   . Migraine     "I use to" (02/11/2013)  . Arthritis     "hands & feet" (02/11/2013)  . Peripheral neuropathy     Hattie Perch 02/11/2013  . B12 deficiency anemia      Hattie Perch 02/11/2013  . Chronic pain syndrome     Hattie Perch 02/11/2013  . UTI (lower urinary tract infection) 02/11/2013    Hattie Perch 02/11/2013  . Chronic edema     BLE/notes 02/11/2013    Past Surgical History  Procedure Laterality Date  . Abdominal hysterectomy  ~ 1987; ~ 2004    "[place]; ferguson" (02/11/2013)  . Cesarean section  1610; 1984; 1986  . Anterior cervical decomp/discectomy fusion  2009; 2011  . Tonsillectomy  1990's?  . Cataract extraction w/phaco  06/20/2011    Procedure: CATARACT EXTRACTION PHACO AND INTRAOCULAR LENS PLACEMENT (IOC);  Surgeon: [name] T. Nile Riggs;  Location: [facility];  Service: Ophthalmology;  Laterality: Left;  CDE 1.81  . Cataract extraction w/phaco  07/04/2011    Procedure: CATARACT EXTRACTION PHACO AND INTRAOCULAR LENS PLACEMENT (IOC);  Surgeon: [name] T. Nile Riggs;  Location: [facility];  Service: Ophthalmology;  Laterality: Right;  CDE: 1.76  . Yag laser application Right 12/03/2012    Procedure: YAG LASER APPLICATION;  Surgeon: [name] T. Nile Riggs, MD;  Location: [facility];  Service: Ophthalmology;  Laterality: Right;  . Yag laser application Left 12/17/2012    Procedure: YAG LASER APPLICATION;  Surgeon: [name] T. Nile Riggs, MD;  Location: [facility];  Service: Ophthalmology;  Laterality: Left;  . Appendectomy      Jarold Motto MS, RD, LDN Pager: [phone] After-hours pager: [phone]

## 2013-02-12 NOTE — Progress Notes (Signed)
Pt is tired of waiting for the ambulance ride home. Pt called her sister, who brought two strong men, to help get her into the wheelchair and bring her home. I told them that I did not think this is a safe idea, and encouraged the pt to wait for the ambulance, which is only 15-20 minutes away, but pt does not want to wait any longer.

## 2013-02-12 NOTE — Telephone Encounter (Signed)
Patient called in needing a prescription called in for a bedside potty.To [facility].

## 2013-02-12 NOTE — Progress Notes (Signed)
  Pt orientation to unit, room and routine. Information packet given to patient.  Admission INP armband ID verified with patient/family, and in place. Patient and family verbalizing understanding of risks associated with falls. Pt verbalizes an understanding of how to use the call bell and to call for help before getting out of bed.    Will cont to monitor and assist as needed.  Gilman Schmidt, RN 02/12/2013 6:57 AM

## 2013-02-13 ENCOUNTER — Telehealth: Payer: Self-pay | Admitting: Family Medicine

## 2013-02-13 NOTE — Telephone Encounter (Signed)
Needs prescription for Peroset 10/325.  Call if able to fill.

## 2013-02-13 NOTE — Telephone Encounter (Signed)
[place] to address

## 2013-02-13 NOTE — ED Provider Notes (Signed)
Medical screening examination/treatment/procedure(s) were performed by non-physician practitioner and as supervising physician I was immediately available for consultation/collaboration.   Aurorah Schlachter, MD 02/13/13 1530 

## 2013-02-13 NOTE — Telephone Encounter (Signed)
F/u app in 07/08/12, requesting refill on Percocet 10/325

## 2013-02-14 ENCOUNTER — Telehealth: Payer: Self-pay | Admitting: Family Medicine

## 2013-02-14 DIAGNOSIS — G35 Multiple sclerosis: Secondary | ICD-10-CM | POA: Diagnosis not present

## 2013-02-14 DIAGNOSIS — Z5189 Encounter for other specified aftercare: Secondary | ICD-10-CM | POA: Diagnosis not present

## 2013-02-14 DIAGNOSIS — N390 Urinary tract infection, site not specified: Secondary | ICD-10-CM | POA: Diagnosis not present

## 2013-02-14 DIAGNOSIS — F329 Major depressive disorder, single episode, unspecified: Secondary | ICD-10-CM | POA: Diagnosis not present

## 2013-02-14 DIAGNOSIS — IMO0001 Reserved for inherently not codable concepts without codable children: Secondary | ICD-10-CM | POA: Diagnosis not present

## 2013-02-14 DIAGNOSIS — G609 Hereditary and idiopathic neuropathy, unspecified: Secondary | ICD-10-CM | POA: Diagnosis not present

## 2013-02-14 DIAGNOSIS — J449 Chronic obstructive pulmonary disease, unspecified: Secondary | ICD-10-CM | POA: Diagnosis not present

## 2013-02-14 MED ORDER — OXYCODONE-ACETAMINOPHEN 10-325 MG PO TABS: 1.0000 | ORAL_TABLET | ORAL | Status: DC | PRN

## 2013-02-14 NOTE — Telephone Encounter (Signed)
Misty Stanley from [facility] called stating patient left Rehab against her judgement. Pt is very weak . She states that she is afraid family members might drop her when trying to assist her. Also is wants an order for a hoyer lift for patient. Fax to [phone]

## 2013-02-14 NOTE — Telephone Encounter (Addendum)
[facility]  Patient has follow up office visit scheduled for 02/17/13.

## 2013-02-14 NOTE — Telephone Encounter (Signed)
Rx printed and left up front for patient pick up. Patient aware.

## 2013-02-14 NOTE — Telephone Encounter (Signed)
May have order for hoyer lift, may well need ov please

## 2013-02-14 NOTE — Telephone Encounter (Signed)
The patient was seen in April. She is due to be rechecked in June. She may have an additional refill.she ought to followup in June.

## 2013-02-17 ENCOUNTER — Ambulatory Visit (INDEPENDENT_AMBULATORY_CARE_PROVIDER_SITE_OTHER): Payer: BC Managed Care – PPO | Admitting: Family Medicine

## 2013-02-17 ENCOUNTER — Emergency Department (HOSPITAL_COMMUNITY)
Admission: EM | Admit: 2013-02-17 | Discharge: 2013-02-17 | Disposition: A | Discharge status: Home / Self Care | Payer: BC Managed Care – PPO | Attending: Emergency Medicine | Admitting: Emergency Medicine

## 2013-02-17 ENCOUNTER — Encounter (HOSPITAL_COMMUNITY): Payer: Self-pay

## 2013-02-17 ENCOUNTER — Encounter: Payer: Self-pay | Admitting: Family Medicine

## 2013-02-17 VITALS — BP 100/68

## 2013-02-17 DIAGNOSIS — Z8669 Personal history of other diseases of the nervous system and sense organs: Secondary | ICD-10-CM | POA: Insufficient documentation

## 2013-02-17 DIAGNOSIS — Z880 Allergy status to penicillin: Secondary | ICD-10-CM | POA: Diagnosis not present

## 2013-02-17 DIAGNOSIS — G609 Hereditary and idiopathic neuropathy, unspecified: Secondary | ICD-10-CM | POA: Diagnosis not present

## 2013-02-17 DIAGNOSIS — D518 Other vitamin B12 deficiency anemias: Secondary | ICD-10-CM | POA: Insufficient documentation

## 2013-02-17 DIAGNOSIS — IMO0001 Reserved for inherently not codable concepts without codable children: Secondary | ICD-10-CM | POA: Diagnosis not present

## 2013-02-17 DIAGNOSIS — R5381 Other malaise: Secondary | ICD-10-CM | POA: Diagnosis not present

## 2013-02-17 DIAGNOSIS — Z87440 Personal history of urinary (tract) infections: Secondary | ICD-10-CM | POA: Diagnosis not present

## 2013-02-17 DIAGNOSIS — G8929 Other chronic pain: Secondary | ICD-10-CM | POA: Insufficient documentation

## 2013-02-17 DIAGNOSIS — R5383 Other fatigue: Secondary | ICD-10-CM | POA: Insufficient documentation

## 2013-02-17 DIAGNOSIS — J4489 Other specified chronic obstructive pulmonary disease: Secondary | ICD-10-CM | POA: Insufficient documentation

## 2013-02-17 DIAGNOSIS — M129 Arthropathy, unspecified: Secondary | ICD-10-CM | POA: Insufficient documentation

## 2013-02-17 DIAGNOSIS — F3289 Other specified depressive episodes: Secondary | ICD-10-CM | POA: Insufficient documentation

## 2013-02-17 DIAGNOSIS — Z91040 Latex allergy status: Secondary | ICD-10-CM | POA: Diagnosis not present

## 2013-02-17 DIAGNOSIS — F329 Major depressive disorder, single episode, unspecified: Secondary | ICD-10-CM | POA: Diagnosis not present

## 2013-02-17 DIAGNOSIS — J449 Chronic obstructive pulmonary disease, unspecified: Secondary | ICD-10-CM | POA: Diagnosis not present

## 2013-02-17 DIAGNOSIS — Z993 Dependence on wheelchair: Secondary | ICD-10-CM | POA: Diagnosis not present

## 2013-02-17 DIAGNOSIS — M545 Low back pain, unspecified: Secondary | ICD-10-CM | POA: Diagnosis not present

## 2013-02-17 DIAGNOSIS — R609 Edema, unspecified: Secondary | ICD-10-CM | POA: Diagnosis not present

## 2013-02-17 DIAGNOSIS — Z8679 Personal history of other diseases of the circulatory system: Secondary | ICD-10-CM | POA: Insufficient documentation

## 2013-02-17 DIAGNOSIS — E871 Hypo-osmolality and hyponatremia: Secondary | ICD-10-CM | POA: Diagnosis not present

## 2013-02-17 DIAGNOSIS — Z8719 Personal history of other diseases of the digestive system: Secondary | ICD-10-CM | POA: Diagnosis not present

## 2013-02-17 DIAGNOSIS — J45909 Unspecified asthma, uncomplicated: Secondary | ICD-10-CM | POA: Diagnosis not present

## 2013-02-17 DIAGNOSIS — R29898 Other symptoms and signs involving the musculoskeletal system: Secondary | ICD-10-CM

## 2013-02-17 DIAGNOSIS — G35 Multiple sclerosis: Secondary | ICD-10-CM | POA: Diagnosis not present

## 2013-02-17 DIAGNOSIS — G894 Chronic pain syndrome: Secondary | ICD-10-CM | POA: Diagnosis not present

## 2013-02-17 DIAGNOSIS — R000 Tachycardia, unspecified: Secondary | ICD-10-CM | POA: Diagnosis not present

## 2013-02-17 DIAGNOSIS — Z79899 Other long term (current) drug therapy: Secondary | ICD-10-CM | POA: Diagnosis not present

## 2013-02-17 DIAGNOSIS — F411 Generalized anxiety disorder: Secondary | ICD-10-CM | POA: Insufficient documentation

## 2013-02-17 DIAGNOSIS — Z5189 Encounter for other specified aftercare: Secondary | ICD-10-CM | POA: Diagnosis not present

## 2013-02-17 DIAGNOSIS — N390 Urinary tract infection, site not specified: Secondary | ICD-10-CM | POA: Diagnosis not present

## 2013-02-17 DIAGNOSIS — N289 Disorder of kidney and ureter, unspecified: Secondary | ICD-10-CM | POA: Diagnosis not present

## 2013-02-17 DIAGNOSIS — F17200 Nicotine dependence, unspecified, uncomplicated: Secondary | ICD-10-CM | POA: Diagnosis not present

## 2013-02-17 DIAGNOSIS — R300 Dysuria: Secondary | ICD-10-CM | POA: Diagnosis not present

## 2013-02-17 DIAGNOSIS — R531 Weakness: Secondary | ICD-10-CM

## 2013-02-17 LAB — CBC WITH DIFFERENTIAL/PLATELET
Basophils Absolute: 0 10*3/uL (ref 0.0–0.1)
Eosinophils Absolute: 0.1 10*3/uL (ref 0.0–0.7)
Eosinophils Relative: 1 % (ref 0–5)
MCH: 33.1 pg (ref 26.0–34.0)
MCHC: 32.7 g/dL (ref 30.0–36.0)
MCV: 101.4 fL — ABNORMAL HIGH (ref 78.0–100.0)
Platelets: 243 10*3/uL (ref 150–400)
RDW: 16.6 % — ABNORMAL HIGH (ref 11.5–15.5)

## 2013-02-17 LAB — BASIC METABOLIC PANEL
Calcium: 9.3 mg/dL (ref 8.4–10.5)
GFR calc non Af Amer: >90 mL/min (ref >90)
Sodium: 136 mEq/L (ref 135–145)

## 2013-02-17 LAB — HEPATIC FUNCTION PANEL
Albumin: 2.5 g/dL — ABNORMAL LOW (ref 3.5–5.2)
Indirect Bilirubin: 0.2 mg/dL — ABNORMAL LOW (ref 0.3–0.9)
Total Bilirubin: 0.5 mg/dL (ref 0.3–1.2)
Total Protein: 6.4 g/dL (ref 6.0–8.3)

## 2013-02-17 MED ORDER — SODIUM CHLORIDE 0.9 % IV BOLUS (SEPSIS)
1000.0000 mL | Freq: Once | INTRAVENOUS | Status: AC
  Administered 2013-02-17: 1000 mL via INTRAVENOUS

## 2013-02-17 NOTE — ED Notes (Signed)
Pt carried out vis wheelchair & placed in car by this nurse & ED tech

## 2013-02-17 NOTE — ED Provider Notes (Signed)
History    This chart was scribed for Benny Lennert, MD by Leone Payor, ED Scribe. This patient was seen in room APA09/APA09 and the patient's care was started 4:37 PM.   CSN: [id number]  Arrival date & time 02/17/13  1609   First MD Initiated Contact with Patient 02/17/13 1634      Chief Complaint  Patient presents with  . Weakness     Patient is a 47 y.o. female presenting with weakness. The history is provided by the patient. No language interpreter was used.  Weakness This is a chronic problem. The current episode started more than 1 week ago. The problem occurs constantly. The problem has been gradually worsening. Pertinent negatives include no chest pain, no abdominal pain and no headaches.    HPI Comments: [name] is a 47 y.o. female who presents to the Emergency Department complaining of ongoing, worsened, constant weakness to whole body starting 2-3 weeks ago. She was recently admitted to [facility] hospital 4-5 days ago and left AMA because she believes they weren't helping her. She was seen by Lilyan Punt today for follow up. Reports not being able to walk for about 3-4 weeks and uses a wheelchair (on and off for 2-3 months) and has a lift in her home. States she has a B12 deficiency and was told it was the cause of her gait problem. Pt has h/o MS.     Past Medical History  Diagnosis Date  . Anxiety   . Depression   . COPD (chronic obstructive pulmonary disease)   . MS (multiple sclerosis)   . Fibromyalgia   . Impaired fasting glucose   . 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 Eating disorder   . Chronic low back pain   . PONV (postoperative nausea and vomiting)   . Chronic bronchitis     "yearly" (02/11/2013)  . Borderline diabetes   . GERD (gastroesophageal reflux disease)   . Migraine     "I use to" (02/11/2013)  . Arthritis     "hands & feet" (02/11/2013)  . Peripheral neuropathy     Hattie Perch 02/11/2013  . B12 deficiency anemia      Hattie Perch 02/11/2013  . Chronic pain syndrome     Hattie Perch 02/11/2013  . UTI (lower urinary tract infection) 02/11/2013    Hattie Perch 02/11/2013  . Chronic edema     BLE/notes 02/11/2013    Past Surgical History  Procedure Laterality Date  . Abdominal hysterectomy  ~ 1987; ~ 2004    "woodward; ferguson" (02/11/2013)  . Cesarean section  2130; 1984; 1986  . Anterior cervical decomp/discectomy fusion  2009; 2011  . Tonsillectomy  1990's?  . Cataract extraction w/phaco  06/20/2011    Procedure: CATARACT EXTRACTION PHACO AND INTRAOCULAR LENS PLACEMENT (IOC);  Surgeon: [name] T. Nile Riggs;  Location: [facility];  Service: Ophthalmology;  Laterality: Left;  CDE 1.81  . Cataract extraction w/phaco  07/04/2011    Procedure: CATARACT EXTRACTION PHACO AND INTRAOCULAR LENS PLACEMENT (IOC);  Surgeon: [name] T. Nile Riggs;  Location: [facility];  Service: Ophthalmology;  Laterality: Right;  CDE: 1.76  . Yag laser application Right 12/03/2012    Procedure: YAG LASER APPLICATION;  Surgeon: [name] T. Nile Riggs, MD;  Location: [facility];  Service: Ophthalmology;  Laterality: Right;  . Yag laser application Left 12/17/2012    Procedure: YAG LASER APPLICATION;  Surgeon: [name] T. Nile Riggs, MD;  Location: [facility];  Service: Ophthalmology;  Laterality: Left;  . Appendectomy  Family History  Problem Relation Age of Onset  . Anesthesia problems Neg Hx   . Hypotension Neg Hx   . Malignant hyperthermia Neg Hx   . Pseudochol deficiency Neg Hx   . Hyperlipidemia Mother   . Heart disease Mother   . Cancer Mother     lung  . Diabetes Father   . Heart disease Father   . Cancer Maternal Grandmother     breast    History  Substance Use Topics  . Smoking status: Current Every Day Smoker -- 1.00 packs/day for 33 years    Types: Cigarettes  . Smokeless tobacco: Never Used     Comment: 02/11/2013 offered smoking cessation materials; pt declines  . Alcohol Use: No    OB History   Grav Para Term Preterm Abortions TAB SAB Ect Mult Living                   Review of Systems  Constitutional: Negative for appetite change and fatigue.  HENT: Negative for congestion, sinus pressure and ear discharge.   Eyes: Negative for discharge.  Respiratory: Negative for cough.   Cardiovascular: Negative for chest pain.  Gastrointestinal: Negative for abdominal pain and diarrhea.  Genitourinary: Negative for frequency and hematuria.  Musculoskeletal: Negative for back pain.  Skin: Negative for rash.  Neurological: Positive for weakness. Negative for seizures and headaches.  Psychiatric/Behavioral: Negative for hallucinations.    Allergies  Ambien; Ceftin; Hctz; Hydrocodone; Lodine; Promethazine; Roxicodone; Codeine; Latex; and Penicillins  Home Medications   Current Outpatient Rx  Name  Route  Sig  Dispense  Refill  . albuterol (PROVENTIL HFA;VENTOLIN HFA) 108 (90 BASE) MCG/ACT inhaler   Inhalation   Inhale 2 puffs into the lungs every 6 (six) hours as needed. COPD   1 Inhaler   4   . ciprofloxacin (CIPRO) 500 MG tablet   Oral   Take 1 tablet (500 mg total) by mouth 2 (two) times daily. For 3 days   6 tablet   0   . citalopram (CELEXA) 20 MG tablet   Oral   Take 1 tablet (20 mg total) by mouth daily.   30 tablet   2   . cyanocobalamin (,VITAMIN B-12,) 1000 MCG/ML injection      INJECT 1 ML INTRAMUSCULARLY ONCE MONTHLY.   1 mL   PRN   . cyclobenzaprine (FLEXERIL) 10 MG tablet   Oral   Take 10 mg by mouth at bedtime.           . diazepam (VALIUM) 5 MG tablet   Oral   Take 1 tablet (5 mg total) by mouth every 12 (twelve) hours as needed for anxiety. Sleep/Muscle Relaxer         . gabapentin (NEURONTIN) 300 MG capsule   Oral   Take 1 capsule (300 mg total) by mouth 3 (three) times daily.         . meloxicam (MOBIC) 15 MG tablet   Oral   Take 15 mg by mouth daily.          . Multiple Vitamins-Calcium (ONE-A-DAY WOMENS PO)   Oral   Take 1 tablet by mouth every morning.          Marland Kitchen oxyCODONE-acetaminophen  (PERCOCET) 10-325 MG per tablet   Oral   Take 1 tablet by mouth every 4 (four) hours as needed for pain (discontinue oxy 15mg  IR/ no greater than 5 per day.).   150 tablet   0   . potassium  chloride (K-DUR) 10 MEQ tablet   Oral   Take 10 mEq by mouth daily.         Marland Kitchen torsemide (DEMADEX) 20 MG tablet   Oral   Take 20 mg by mouth every morning.           There were no vitals taken for this visit.  Physical Exam  Nursing note and vitals reviewed. Constitutional: She is oriented to person, place, and time. She appears well-developed.  HENT:  Head: Normocephalic.  Mouth/Throat: Mucous membranes are dry.  Eyes: Conjunctivae and EOM are normal. No scleral icterus.  Neck: Neck supple. No thyromegaly present.  Cardiovascular: Regular rhythm and normal heart sounds.  Tachycardia present.  Exam reveals no gallop and no friction rub.   No murmur heard. Pulmonary/Chest: Effort normal and breath sounds normal. No stridor. She has no wheezes. She has no rales. She exhibits no tenderness.  Abdominal: Soft. Bowel sounds are normal. She exhibits no distension. There is no tenderness. There is no rebound.  Musculoskeletal: Normal range of motion. She exhibits no edema.  Lymphadenopathy:    She has no cervical adenopathy.  Neurological: She is oriented to person, place, and time. Coordination normal.  Profound weakness in lower extremities.  Skin: No rash noted. No erythema.  Psychiatric: She has a normal mood and affect. Her behavior is normal.    ED Course  Procedures (including critical care time)  DIAGNOSTIC STUDIES: N/A   COORDINATION OF CARE: 4:42 PM Discussed treatment plan with pt at bedside and pt agreed to plan.   7:09 PM Pt informed of lab results.    Results for orders placed during the hospital encounter of 02/17/13  CBC WITH DIFFERENTIAL      Result Value Range   WBC 7.3  4.0 - 10.5 K/uL   RBC 4.38  3.87 - 5.11 MIL/uL   Hemoglobin 14.5  12.0 - 15.0 g/dL   HCT  45.4  09.8 - 11.9 %   MCV 101.4 (*) 78.0 - 100.0 fL   MCH 33.1  26.0 - 34.0 pg   MCHC 32.7  30.0 - 36.0 g/dL   RDW 14.7 (*) 82.9 - 56.2 %   Platelets 243  150 - 400 K/uL   Neutrophils Relative 65  43 - 77 %   Neutro Abs 4.7  1.7 - 7.7 K/uL   Lymphocytes Relative 25  12 - 46 %   Lymphs Abs 1.8  0.7 - 4.0 K/uL   Monocytes Relative 9  3 - 12 %   Monocytes Absolute 0.7  0.1 - 1.0 K/uL   Eosinophils Relative 1  0 - 5 %   Eosinophils Absolute 0.1  0.0 - 0.7 K/uL   Basophils Relative 0  0 - 1 %   Basophils Absolute 0.0  0.0 - 0.1 K/uL  BASIC METABOLIC PANEL      Result Value Range   Sodium 136  135 - 145 mEq/L   Potassium 3.9  3.5 - 5.1 mEq/L   Chloride 95 (*) 96 - 112 mEq/L   CO2 30  19 - 32 mEq/L   Glucose, Bld 102 (*) 70 - 99 mg/dL   BUN 12  6 - 23 mg/dL   Creatinine, Ser 1.30  0.50 - 1.10 mg/dL   Calcium 9.3  8.4 - 86.5 mg/dL   GFR calc non Af Amer >90  >90 mL/min   GFR calc Af Amer >90  >90 mL/min  HEPATIC FUNCTION PANEL      Result Value  Range   Total Protein 6.4  6.0 - 8.3 g/dL   Albumin 2.5 (*) 3.5 - 5.2 g/dL   AST 161 (*) 0 - 37 U/L   ALT 141 (*) 0 - 35 U/L   Alkaline Phosphatase 99  39 - 117 U/L   Total Bilirubin 0.5  0.3 - 1.2 mg/dL   Bilirubin, Direct 0.3  0.0 - 0.3 mg/dL   Indirect Bilirubin 0.2 (*) 0.3 - 0.9 mg/dL      No results found.   No diagnosis found.    MDM  Lower extr weekness.  Pt did not want admission.  She will see her neurologist in 2 days.  Elevated liver studies.  She will follow up with her family md     The chart was scribed for me under my direct supervision.  I personally performed the history, physical, and medical decision making and all procedures in the evaluation of this patient.Benny Lennert, MD 02/17/13 [id number]

## 2013-02-17 NOTE — ED Notes (Signed)
Patient put on bedpan complain of burn when urinating.

## 2013-02-17 NOTE — ED Notes (Signed)
Pt was recently admitted to [facility] and left ama.   Pt says she left because they weren't doing anything for her.   Pt went to Dr. Fletcher Anon office today, unable to walk, extremely weak.  Reports calcium low.     Pt says hasn't been able to walk for the past 2 or 3 weeks.

## 2013-02-17 NOTE — Patient Instructions (Signed)
Go to er

## 2013-02-17 NOTE — Progress Notes (Signed)
  Subjective:    Patient ID: [name], female    DOB: [birth year], 47 y.o.   MRN: [id number]  HPI This patient presents today with severe lethargy and weakness. Her sister and a caretaker states that they've pretty much had to lift her up to move her around the house. She has a difficult time eating and swallowing she is at times disoriented. She was recently at [facility] for low potassium and low calcium but she stayed 24 hours and left AMA. She has a history of chronic pain, neurologic condition for which she was being followed for possible MS by a neurologist at [facility] she also has history of anxiety as well.   Review of Systems No vomiting or diarrhea no high fevers no rash    Objective:   Physical Exam  Blood pressure relatively low, heart rate 110. Respiratory status normal lungs are clear hearts regular pulse normal abdomen soft slight distention extremities trace edema severe global weakness noted of the arms and legs.      Assessment & Plan:  Severe weakness- recent history of hypocalcemia, poor Oral intake., Recent severe hypocalcemia in the hospital with patient leaving AMA. Long discussion with patient I told her in her present condition she needs to be reevaluated in the emergency department and more than likely admitted and treated for hypocalcemia as well his workup for this. Patient is not capable of caring for herself currently. I have spoken with the ER. They will be seeing her shortly.

## 2013-02-17 NOTE — ED Notes (Signed)
Patient requesting pain medication. MD aware and wants to have the patient further evaluated prior to medicating for pain due to patient's drowsiness. Patient/family made aware.

## 2013-02-18 DIAGNOSIS — Z5189 Encounter for other specified aftercare: Secondary | ICD-10-CM | POA: Diagnosis not present

## 2013-02-18 DIAGNOSIS — G609 Hereditary and idiopathic neuropathy, unspecified: Secondary | ICD-10-CM | POA: Diagnosis not present

## 2013-02-18 DIAGNOSIS — F329 Major depressive disorder, single episode, unspecified: Secondary | ICD-10-CM | POA: Diagnosis not present

## 2013-02-18 DIAGNOSIS — G35 Multiple sclerosis: Secondary | ICD-10-CM | POA: Diagnosis not present

## 2013-02-18 DIAGNOSIS — J449 Chronic obstructive pulmonary disease, unspecified: Secondary | ICD-10-CM | POA: Diagnosis not present

## 2013-02-18 DIAGNOSIS — N390 Urinary tract infection, site not specified: Secondary | ICD-10-CM | POA: Diagnosis not present

## 2013-02-19 DIAGNOSIS — R635 Abnormal weight gain: Secondary | ICD-10-CM | POA: Diagnosis not present

## 2013-02-19 DIAGNOSIS — R918 Other nonspecific abnormal finding of lung field: Secondary | ICD-10-CM | POA: Diagnosis not present

## 2013-02-19 DIAGNOSIS — Z0389 Encounter for observation for other suspected diseases and conditions ruled out: Secondary | ICD-10-CM | POA: Diagnosis not present

## 2013-02-19 DIAGNOSIS — E870 Hyperosmolality and hypernatremia: Secondary | ICD-10-CM | POA: Diagnosis present

## 2013-02-19 DIAGNOSIS — G35 Multiple sclerosis: Secondary | ICD-10-CM | POA: Diagnosis not present

## 2013-02-19 DIAGNOSIS — J9819 Other pulmonary collapse: Secondary | ICD-10-CM | POA: Diagnosis not present

## 2013-02-19 DIAGNOSIS — I10 Essential (primary) hypertension: Secondary | ICD-10-CM | POA: Diagnosis not present

## 2013-02-19 DIAGNOSIS — D473 Essential (hemorrhagic) thrombocythemia: Secondary | ICD-10-CM | POA: Diagnosis not present

## 2013-02-19 DIAGNOSIS — R6521 Severe sepsis with septic shock: Secondary | ICD-10-CM | POA: Diagnosis not present

## 2013-02-19 DIAGNOSIS — Z9911 Dependence on respirator [ventilator] status: Secondary | ICD-10-CM | POA: Diagnosis not present

## 2013-02-19 DIAGNOSIS — I369 Nonrheumatic tricuspid valve disorder, unspecified: Secondary | ICD-10-CM | POA: Diagnosis not present

## 2013-02-19 DIAGNOSIS — D469 Myelodysplastic syndrome, unspecified: Secondary | ICD-10-CM | POA: Diagnosis not present

## 2013-02-19 DIAGNOSIS — E876 Hypokalemia: Secondary | ICD-10-CM | POA: Diagnosis present

## 2013-02-19 DIAGNOSIS — E8809 Other disorders of plasma-protein metabolism, not elsewhere classified: Secondary | ICD-10-CM | POA: Diagnosis not present

## 2013-02-19 DIAGNOSIS — R6520 Severe sepsis without septic shock: Secondary | ICD-10-CM | POA: Diagnosis not present

## 2013-02-19 DIAGNOSIS — J189 Pneumonia, unspecified organism: Secondary | ICD-10-CM | POA: Diagnosis not present

## 2013-02-19 DIAGNOSIS — R7401 Elevation of levels of liver transaminase levels: Secondary | ICD-10-CM | POA: Diagnosis present

## 2013-02-19 DIAGNOSIS — R569 Unspecified convulsions: Secondary | ICD-10-CM | POA: Diagnosis not present

## 2013-02-19 DIAGNOSIS — R262 Difficulty in walking, not elsewhere classified: Secondary | ICD-10-CM | POA: Diagnosis not present

## 2013-02-19 DIAGNOSIS — B3749 Other urogenital candidiasis: Secondary | ICD-10-CM | POA: Diagnosis present

## 2013-02-19 DIAGNOSIS — K315 Obstruction of duodenum: Secondary | ICD-10-CM | POA: Diagnosis present

## 2013-02-19 DIAGNOSIS — N390 Urinary tract infection, site not specified: Secondary | ICD-10-CM | POA: Diagnosis not present

## 2013-02-19 DIAGNOSIS — R29898 Other symptoms and signs involving the musculoskeletal system: Secondary | ICD-10-CM | POA: Diagnosis not present

## 2013-02-19 DIAGNOSIS — J90 Pleural effusion, not elsewhere classified: Secondary | ICD-10-CM | POA: Diagnosis not present

## 2013-02-19 DIAGNOSIS — M6281 Muscle weakness (generalized): Secondary | ICD-10-CM | POA: Diagnosis not present

## 2013-02-19 DIAGNOSIS — R7309 Other abnormal glucose: Secondary | ICD-10-CM | POA: Diagnosis not present

## 2013-02-19 DIAGNOSIS — K5900 Constipation, unspecified: Secondary | ICD-10-CM | POA: Diagnosis not present

## 2013-02-19 DIAGNOSIS — R471 Dysarthria and anarthria: Secondary | ICD-10-CM | POA: Diagnosis not present

## 2013-02-19 DIAGNOSIS — R0902 Hypoxemia: Secondary | ICD-10-CM | POA: Diagnosis not present

## 2013-02-19 DIAGNOSIS — J151 Pneumonia due to Pseudomonas: Secondary | ICD-10-CM | POA: Diagnosis present

## 2013-02-19 DIAGNOSIS — K560 Paralytic ileus: Secondary | ICD-10-CM | POA: Diagnosis not present

## 2013-02-19 DIAGNOSIS — J810 Acute pulmonary edema: Secondary | ICD-10-CM | POA: Diagnosis not present

## 2013-02-19 DIAGNOSIS — R609 Edema, unspecified: Secondary | ICD-10-CM | POA: Diagnosis not present

## 2013-02-19 DIAGNOSIS — R7989 Other specified abnormal findings of blood chemistry: Secondary | ICD-10-CM | POA: Diagnosis not present

## 2013-02-19 DIAGNOSIS — I248 Other forms of acute ischemic heart disease: Secondary | ICD-10-CM | POA: Diagnosis not present

## 2013-02-19 DIAGNOSIS — F329 Major depressive disorder, single episode, unspecified: Secondary | ICD-10-CM | POA: Diagnosis not present

## 2013-02-19 DIAGNOSIS — R6510 Systemic inflammatory response syndrome (SIRS) of non-infectious origin without acute organ dysfunction: Secondary | ICD-10-CM | POA: Diagnosis not present

## 2013-02-19 DIAGNOSIS — G35D Multiple sclerosis, unspecified: Secondary | ICD-10-CM | POA: Diagnosis not present

## 2013-02-19 DIAGNOSIS — R6889 Other general symptoms and signs: Secondary | ICD-10-CM | POA: Diagnosis not present

## 2013-02-19 DIAGNOSIS — R000 Tachycardia, unspecified: Secondary | ICD-10-CM | POA: Diagnosis not present

## 2013-02-19 DIAGNOSIS — A419 Sepsis, unspecified organism: Secondary | ICD-10-CM | POA: Diagnosis not present

## 2013-02-19 DIAGNOSIS — R509 Fever, unspecified: Secondary | ICD-10-CM | POA: Diagnosis not present

## 2013-02-19 DIAGNOSIS — G909 Disorder of the autonomic nervous system, unspecified: Secondary | ICD-10-CM | POA: Diagnosis present

## 2013-02-19 DIAGNOSIS — J811 Chronic pulmonary edema: Secondary | ICD-10-CM | POA: Diagnosis not present

## 2013-02-19 DIAGNOSIS — E041 Nontoxic single thyroid nodule: Secondary | ICD-10-CM | POA: Diagnosis not present

## 2013-02-19 DIAGNOSIS — R633 Feeding difficulties, unspecified: Secondary | ICD-10-CM | POA: Diagnosis not present

## 2013-02-19 DIAGNOSIS — R34 Anuria and oliguria: Secondary | ICD-10-CM | POA: Diagnosis not present

## 2013-02-19 DIAGNOSIS — Z4682 Encounter for fitting and adjustment of non-vascular catheter: Secondary | ICD-10-CM | POA: Diagnosis not present

## 2013-02-19 DIAGNOSIS — R1310 Dysphagia, unspecified: Secondary | ICD-10-CM | POA: Diagnosis present

## 2013-02-19 DIAGNOSIS — R0989 Other specified symptoms and signs involving the circulatory and respiratory systems: Secondary | ICD-10-CM | POA: Diagnosis not present

## 2013-02-19 DIAGNOSIS — R279 Unspecified lack of coordination: Secondary | ICD-10-CM | POA: Diagnosis not present

## 2013-02-19 DIAGNOSIS — G9340 Encephalopathy, unspecified: Secondary | ICD-10-CM | POA: Diagnosis not present

## 2013-02-19 DIAGNOSIS — D72829 Elevated white blood cell count, unspecified: Secondary | ICD-10-CM | POA: Diagnosis not present

## 2013-02-19 DIAGNOSIS — G609 Hereditary and idiopathic neuropathy, unspecified: Secondary | ICD-10-CM | POA: Diagnosis not present

## 2013-02-19 DIAGNOSIS — R839 Unspecified abnormal finding in cerebrospinal fluid: Secondary | ICD-10-CM | POA: Diagnosis not present

## 2013-02-19 DIAGNOSIS — Z792 Long term (current) use of antibiotics: Secondary | ICD-10-CM | POA: Diagnosis not present

## 2013-02-19 DIAGNOSIS — R0609 Other forms of dyspnea: Secondary | ICD-10-CM | POA: Diagnosis not present

## 2013-02-19 DIAGNOSIS — J449 Chronic obstructive pulmonary disease, unspecified: Secondary | ICD-10-CM | POA: Diagnosis not present

## 2013-02-19 DIAGNOSIS — D696 Thrombocytopenia, unspecified: Secondary | ICD-10-CM | POA: Diagnosis not present

## 2013-02-19 DIAGNOSIS — Z5189 Encounter for other specified aftercare: Secondary | ICD-10-CM | POA: Diagnosis not present

## 2013-02-19 DIAGNOSIS — I498 Other specified cardiac arrhythmias: Secondary | ICD-10-CM | POA: Diagnosis not present

## 2013-02-19 DIAGNOSIS — Z79899 Other long term (current) drug therapy: Secondary | ICD-10-CM | POA: Diagnosis not present

## 2013-02-19 DIAGNOSIS — D7589 Other specified diseases of blood and blood-forming organs: Secondary | ICD-10-CM | POA: Diagnosis not present

## 2013-02-19 DIAGNOSIS — J159 Unspecified bacterial pneumonia: Secondary | ICD-10-CM | POA: Diagnosis not present

## 2013-02-19 DIAGNOSIS — E8779 Other fluid overload: Secondary | ICD-10-CM | POA: Diagnosis not present

## 2013-02-19 DIAGNOSIS — E878 Other disorders of electrolyte and fluid balance, not elsewhere classified: Secondary | ICD-10-CM | POA: Diagnosis not present

## 2013-02-19 DIAGNOSIS — R0602 Shortness of breath: Secondary | ICD-10-CM | POA: Diagnosis not present

## 2013-02-19 DIAGNOSIS — I959 Hypotension, unspecified: Secondary | ICD-10-CM | POA: Diagnosis not present

## 2013-02-19 DIAGNOSIS — G603 Idiopathic progressive neuropathy: Secondary | ICD-10-CM | POA: Diagnosis not present

## 2013-02-19 DIAGNOSIS — E861 Hypovolemia: Secondary | ICD-10-CM | POA: Diagnosis not present

## 2013-02-19 DIAGNOSIS — J9600 Acute respiratory failure, unspecified whether with hypoxia or hypercapnia: Secondary | ICD-10-CM | POA: Diagnosis not present

## 2013-02-19 DIAGNOSIS — E538 Deficiency of other specified B group vitamins: Secondary | ICD-10-CM | POA: Diagnosis present

## 2013-02-19 DIAGNOSIS — D62 Acute posthemorrhagic anemia: Secondary | ICD-10-CM | POA: Diagnosis not present

## 2013-02-19 DIAGNOSIS — A4152 Sepsis due to Pseudomonas: Secondary | ICD-10-CM | POA: Diagnosis not present

## 2013-02-19 DIAGNOSIS — D759 Disease of blood and blood-forming organs, unspecified: Secondary | ICD-10-CM | POA: Diagnosis not present

## 2013-02-24 DIAGNOSIS — Z5189 Encounter for other specified aftercare: Secondary | ICD-10-CM | POA: Diagnosis not present

## 2013-02-24 DIAGNOSIS — G35 Multiple sclerosis: Secondary | ICD-10-CM | POA: Diagnosis not present

## 2013-02-24 DIAGNOSIS — G609 Hereditary and idiopathic neuropathy, unspecified: Secondary | ICD-10-CM | POA: Diagnosis not present

## 2013-02-28 DIAGNOSIS — R278 Other lack of coordination: Secondary | ICD-10-CM | POA: Insufficient documentation

## 2013-02-28 DIAGNOSIS — G8929 Other chronic pain: Secondary | ICD-10-CM | POA: Insufficient documentation

## 2013-02-28 DIAGNOSIS — R410 Disorientation, unspecified: Secondary | ICD-10-CM | POA: Insufficient documentation

## 2013-03-03 NOTE — Discharge Summary (Signed)
Physician Discharge Summary  JAQUANA GEIGER ZOX:[id number] DOB: [birth year] DOA: 02/11/2013  PCP: Lilyan Punt, MD  Admit date: 02/11/2013 Discharge date: 02/12/2013  Time spent: 45 minutes  Recommendations for Outpatient Follow-up:  1. PCP in 1 week  Discharge Diagnoses:  Principal Problem:   UTI (urinary tract infection) Active Problems:   Myoclonic jerking   Chronic pain syndrome   Hyponatremia   Tobacco abuse   Bilateral leg edema   Generalized weakness   Hypokalemia   MS   Fibromyalgia  Discharge Condition: Stable  Diet recommendation: low sodium  Filed Weights   02/11/13 2139  Weight: 69.627 kg (153 lb 8 oz)    History of present illness:  [name] is a 47 y.o. Caucasian female with history of obesity, chronic pain syndrome, peripheral neuropathy on Neurontin, B12 deficiency, anxiety, depression, COPD, multiple sclerosis, fibromyalgia, and chronic low back pain who presents with the above complaints. Patient was recently hospitalized at [facility] from 01/13/2013 till 01/15/2013 with hyponatremia and lower extremity edema. Patient reports that her generalized weakness has been a chronic issue for many years. She has seen a neurologist at [facility] and was eventually told her weakness was due to B12 deficiency. Patient improved on B12 replacement however after having eye surgery in February of 2014 she started noticing that she was getting weak again.she has had her medications adjusted including discontinuing her ibuprofen and Mobic by her primary care physician. She was hospitalized for hyponatremia which is eventually resolved with IV hydration with normal saline. Since going home she has had multiple falls on and off but has not lost consciousness. She has been feeling nauseated over the last few days and has vomited once or twice. She presented to the emergency department where she was found to have a urinary tract infection, the hospitalist service  was asked to admit the patient for further care and management. Patient admits to feeling feverish at home with chills but has not checked her temperature. Denies any chest pain or shortness of breath. Denies any abdominal pain or diarrhea.   Hospital Course:  Urinary tract infection  -treated with IV ciprofloxacin then transitioned to PO given clinical improvement. -She felt better and was anxious to be discharged the next day before culture data was available  Generalized weakness  Acute on chronic. No focal neurologic deficits on exam. Likely related to urinary tract infection.  Pt declined to work with PT, i dicussed Imaging with her given h/o MS but clinically she improved with Abx and fluids alone and was not felt to be warranted at this time. I also recommended cutting down some of her meds since polypharmacy will be contributing to her weakness but they want to talk to her PCP about this upon follow up.   Tobacco abuse  Counseled on cessation. Nicotine patch prescribed.   Chronic bilateral lower extremity edema  Patient appears compensated at this time. Continue torsemide.   Hypokalemia  Replaced, Likely due to diuretics  Hyponatremia  Likely due to diuretics, stable asymptomatic   Chronic pain syndrome  Stable.       Discharge Exam: Filed Vitals:   02/12/13 0502 02/12/13 0505 02/12/13 0940 02/12/13 1400  BP: 94/45 92/48 100/62 98/57  Pulse: 105  116 111  Temp: 98.3 F (36.8 C)  98.4 F (36.9 C) 98.6 F (37 C)  TempSrc: Oral  Oral Oral  Resp: 20  20 20   Weight:      SpO2: 99%  92% 97%  General: AAOx3 Cardiovascular:S1S2/RRR Respiratory: CTAB  Discharge Instructions  Discharge Orders   Future Orders Complete By Expires     Increase activity slowly  As directed         Medication List    TAKE these medications       albuterol 108 (90 BASE) MCG/ACT inhaler  Commonly known as:  PROVENTIL HFA;VENTOLIN HFA  Inhale 2 puffs into the lungs every 6  (six) hours as needed. COPD     ciprofloxacin 500 MG tablet  Commonly known as:  CIPRO  Take 1 tablet (500 mg total) by mouth 2 (two) times daily. For 3 days     citalopram 20 MG tablet  Commonly known as:  CELEXA  Take 1 tablet (20 mg total) by mouth daily.     cyclobenzaprine 10 MG tablet  Commonly known as:  FLEXERIL  Take 10 mg by mouth at bedtime.     diazepam 5 MG tablet  Commonly known as:  VALIUM  Take 1 tablet (5 mg total) by mouth every 12 (twelve) hours as needed for anxiety. Sleep/Muscle Relaxer     gabapentin 300 MG capsule  Commonly known as:  NEURONTIN  Take 1 capsule (300 mg total) by mouth 3 (three) times daily.     ONE-A-DAY WOMENS PO  Take 1 tablet by mouth every morning.     potassium chloride 10 MEQ tablet  Commonly known as:  K-DUR  Take 10 mEq by mouth daily.     torsemide 20 MG tablet  Commonly known as:  DEMADEX  Take 20 mg by mouth every morning.       Allergies  Allergen Reactions  . Ambien (Zolpidem Tartrate)   . Ceftin (Cefuroxime Axetil) Nausea And Vomiting  . Hctz (Hydrochlorothiazide)     Urinary retention  . Hydrocodone Nausea Only  . Lodine (Etodolac) Hives  . Promethazine Other (See Comments)    Hallucinations.   . Roxicodone (Oxycodone Hcl Er) Swelling  . Codeine Rash  . Latex Rash  . Penicillins Rash       Follow-up Information   Follow up with [name],[name], MD. Schedule an appointment as soon as possible for a visit in 1 week.   Contact information:   [address] [place] [address] [phone]        The results of significant diagnostics from this hospitalization (including imaging, microbiology, ancillary and laboratory) are listed below for reference.    Significant Diagnostic Studies: No results found.  Microbiology: No results found for this or any previous visit (from the past 240 hour(s)).   Labs: Basic Metabolic Panel: No results found for this basename: NA, K, CL, CO2, GLUCOSE, BUN,  CREATININE, CALCIUM, MG, PHOS,  in the last 168 hours Liver Function Tests: No results found for this basename: AST, ALT, ALKPHOS, BILITOT, PROT, ALBUMIN,  in the last 168 hours No results found for this basename: LIPASE, AMYLASE,  in the last 168 hours No results found for this basename: AMMONIA,  in the last 168 hours CBC: No results found for this basename: WBC, NEUTROABS, HGB, HCT, MCV, PLT,  in the last 168 hours Cardiac Enzymes: No results found for this basename: CKTOTAL, CKMB, CKMBINDEX, TROPONINI,  in the last 168 hours BNP: BNP (last 3 results) No results found for this basename: PROBNP,  in the last 8760 hours CBG: No results found for this basename: GLUCAP,  in the last 168 hours     Signed:  Jamyria Ozanich  [facility] 03/03/2013, 5:51  PM

## 2013-03-25 DIAGNOSIS — E119 Type 2 diabetes mellitus without complications: Secondary | ICD-10-CM | POA: Diagnosis not present

## 2013-03-25 DIAGNOSIS — D649 Anemia, unspecified: Secondary | ICD-10-CM | POA: Diagnosis not present

## 2013-03-25 DIAGNOSIS — E782 Mixed hyperlipidemia: Secondary | ICD-10-CM | POA: Diagnosis not present

## 2013-03-25 DIAGNOSIS — E039 Hypothyroidism, unspecified: Secondary | ICD-10-CM | POA: Diagnosis not present

## 2013-03-25 DIAGNOSIS — Z79899 Other long term (current) drug therapy: Secondary | ICD-10-CM | POA: Diagnosis not present

## 2013-03-27 DIAGNOSIS — R579 Shock, unspecified: Secondary | ICD-10-CM | POA: Insufficient documentation

## 2013-03-27 DIAGNOSIS — R4182 Altered mental status, unspecified: Secondary | ICD-10-CM | POA: Diagnosis not present

## 2013-03-27 DIAGNOSIS — R569 Unspecified convulsions: Secondary | ICD-10-CM | POA: Diagnosis not present

## 2013-03-27 DIAGNOSIS — Z931 Gastrostomy status: Secondary | ICD-10-CM | POA: Insufficient documentation

## 2013-03-27 DIAGNOSIS — N179 Acute kidney failure, unspecified: Secondary | ICD-10-CM | POA: Insufficient documentation

## 2013-03-27 DIAGNOSIS — Z930 Tracheostomy status: Secondary | ICD-10-CM | POA: Insufficient documentation

## 2013-03-27 DIAGNOSIS — J189 Pneumonia, unspecified organism: Secondary | ICD-10-CM | POA: Insufficient documentation

## 2013-03-27 DIAGNOSIS — J9600 Acute respiratory failure, unspecified whether with hypoxia or hypercapnia: Secondary | ICD-10-CM | POA: Insufficient documentation

## 2013-03-30 DIAGNOSIS — R1310 Dysphagia, unspecified: Secondary | ICD-10-CM | POA: Insufficient documentation

## 2013-03-30 DIAGNOSIS — E639 Nutritional deficiency, unspecified: Secondary | ICD-10-CM | POA: Insufficient documentation

## 2013-04-09 DIAGNOSIS — R6889 Other general symptoms and signs: Secondary | ICD-10-CM | POA: Diagnosis not present

## 2013-04-10 ENCOUNTER — Other Ambulatory Visit: Payer: Self-pay | Admitting: Geriatric Medicine

## 2013-04-10 MED ORDER — OXYCODONE-ACETAMINOPHEN 10-325 MG PO TABS: 1.0000 | ORAL_TABLET | Freq: Four times a day (QID) | ORAL | Status: DC | PRN

## 2013-04-12 ENCOUNTER — Non-Acute Institutional Stay (SKILLED_NURSING_FACILITY): Payer: BC Managed Care – PPO | Admitting: Internal Medicine

## 2013-04-12 DIAGNOSIS — E538 Deficiency of other specified B group vitamins: Secondary | ICD-10-CM

## 2013-04-12 DIAGNOSIS — R269 Unspecified abnormalities of gait and mobility: Secondary | ICD-10-CM | POA: Diagnosis not present

## 2013-04-12 DIAGNOSIS — R1314 Dysphagia, pharyngoesophageal phase: Secondary | ICD-10-CM | POA: Diagnosis not present

## 2013-04-12 DIAGNOSIS — J9600 Acute respiratory failure, unspecified whether with hypoxia or hypercapnia: Secondary | ICD-10-CM

## 2013-04-12 DIAGNOSIS — R260 Ataxic gait: Secondary | ICD-10-CM

## 2013-04-14 DIAGNOSIS — Z79899 Other long term (current) drug therapy: Secondary | ICD-10-CM | POA: Diagnosis not present

## 2013-04-14 DIAGNOSIS — D649 Anemia, unspecified: Secondary | ICD-10-CM | POA: Diagnosis not present

## 2013-04-20 NOTE — Progress Notes (Signed)
Patient ID: [name], female   DOB: 05/14/1966, 47 y.o.   MRN: [id number]           HISTORY & PHYSICAL  DATE:  04/11/2013  FACILITY: [facility]   LEVEL OF CARE:   SNF   CHIEF COMPLAINT:  Status post admission to SNF, status post stay at [facility], 03/27/2013 through 04/09/2013.    HISTORY OF PRESENT ILLNESS:  This is a 47 year-old woman who tells me that she has had progressive gait ataxia dating back almost a year.  She was in [facility] and discharged on 03/22/2013 with severe sensory ataxia.  She was on the Neurology service and, among other things, was diagnosed with vitamin B12 deficiency, cataract surgery, and recent chronic pain.  She received a trach and PEG tube on May 30th.    On this occasion, she was admitted on 03/27/2013 with a chief complaint of altered mental status and hypoxemia.  She had tracheal suction to remove a large mucus plug, after which her oxygen improved.   She was noted to have episodes of dysconjugate gaze, jerking movements in all extremities.  She then became hypotensive, requiring pressors and saline.  She ended up in the ICU with concerns of sepsis and was started on vanc and cefepime.  She became hemodynamically stable, weaned off her pressors.  Antibiotics were discontinued on 03/29/2013 when cultures were negative.    The patient was re-seen by Neurology for sensory ataxia, dysconjugate gaze.  Neurology recommended speech therapy.  An MRI of the brain was done that showed no evidence of intracranial pathology.  Speech Therapy evaluated the patient.  She was recommended for a mechanical soft diet and thin liquids.   Currently, she is receiving tube feeding bolus on top of oral intake which can be discontinued once her oral intake meets nutritional needs.  She has a Passy-Muir valve in place and appears to be tolerating this well.  She was noted to have stridor following trach change and this resolved.    PAST MEDICAL  HISTORY/PROBLEM LIST:  Altered mental status,  currently resolved.   Vitamin B12 deficiency with neuropathy.   Sensory ataxia.    Acute kidney injury.   Acute respiratory failure in the setting of a tracheostomy,  currently stable and the patient is tolerating a Passy-Muir valve.   Shock, resolved.   Tracheostomy in place.   G-tube in place.   Hypokalemia.    Hypomagnesemia.   Severe dysphagia.    CURRENT MEDICATIONS:  Medication list is reviewed.    Albuterol nebulizer every 4 hours p.r.n.   Neurontin 300 twice a day.   Klonopin 0.5 q.12.  Percocet q.4 hours p.r.n.   Demadex 20 mg q.d.   Flexeril 10 q.d.   Colace 100 b.i.d.   NicoDerm patch 21 mg.   Seroquel 25 nightly for 30 days.    Celexa 40 mg daily.   Vitamin B12 1000 mg monthly.  KCl 20 mEq daily.  Senokot-S 1 tablet nightly.  Thiamine 100 q.d.   SOCIAL HISTORY: HOUSING:  The patient tells me she was living in a trailer with her boyfriend.   FUNCTIONAL STATUS:  It is really difficult to get a sense of her decline, although she states most of this was gait and balance problems which became severe in the beginning part of this year.  She could not walk.  She would fall without warning.  There was no clear seizure.   TOBACCO USE:  She was  a smoker.   ALCOHOL:  No history of alcohol use.   ILLICIT DRUGS:  No listed drug use.    FAMILY HISTORY: MOTHER:  Mother had cerebral palsy.    REVIEW OF SYSTEMS:   HEENT:  She is not complaining of double-vision.   Is eating and swallowing adequately.   CHEST/RESPIRATORY:  No cough.  No sputum.  CARDIAC:   No chest pain.   GI:  No nausea, vomiting or diarrhea.  She is continent of bowels.  GU:  Continent of urine with good sensation.   NEUROLOGICAL:   Extremities:  Complains of a widespread neuropathic pain, mostly involving her hands, feet, and back.  However, she states that all of her extremities are involved in this.    PHYSICAL EXAMINATION:    GENERAL APPEARANCE:  Pleasant woman who is engaging, interactive, able to answer my questions.  She does not seem to remember a lot about the last few months.  Details seem to be missing.   HEENT:   MOUTH/THROAT:  Lips without lesions.  No lesions noted in mouth.  Tongue is without lesions. Her tongue appears normal.  Oropharynx without redness or lesions.  Uvula elevates midline.  Teeth are in good repair.   EYES:  She has no nystagmus.   NECK/THYROID:  Tracheostomy in place with Passy-Muir valve.  She tolerates this well.   CHEST/RESPIRATORY:  Shallow, but otherwise clear air entry without crackles or wheezes.   CARDIOVASCULAR:  CARDIAC:   Heart sounds are normal.  There are no murmurs.   GASTROINTESTINAL:  ABDOMEN:   Soft.  No masses.   GENITOURINARY:  BLADDER:   Not distended.   CIRCULATION:   EDEMA/VARICOSITIES:   Extremities:  No edema.   SKIN:  INSPECTION:  No wounds are seen.   NEUROLOGICAL:   CRANIAL NERVES:  Extraocular movements are intact.  Facial muscular seems normal.  Cranial nerves IX, X, XI, XII all seem intact.   MOTOR:  She is able to move her limbs.  Her strength is not normal, but it appears equal.  What is most apparent is a severe dysdiadochokinesis, grossly ataxic movements.   DEEP TENDON REFLEXES:  Reflexes are diffusely absent.  There is no Hoffman's reflex.  Her Babinski is flexor.   SENSATION/STRENGTH:  Sensory reasonably normal to light touch.  Cerebellar testing is very poorly done bilaterally, especially on the left greater than right.    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  Her friend came in and told me that this started three years ago after laser eye surgery, which is precisely what the patient told me.  She was able to walk with a cane up until the beginning of this year.  The diagnosis appears to be sensory ataxia with vitamin B12 deficiency combined, from Neurology at [facility].  She had a negative MRI which would tend  to rule out MS.  She tells me she has had two lumbar punctures, one at [facility] and one at [facility].  The progression of this illness would make it unlikely that she is able to ambulate or do any of her self-care.    Acute  respiratory failure requiring a tracheostomy.  I have none of the details of this.  She was a smoker.  I think this was put in during the hospitalization in May.  She also had a PEG tube placed.   Dysphagia.  No doubt related to the underlying neurologic illness.  She has a PEG tube in place but  is eating fairly well.    History of cigarette smoking with probable COPD.  However, this does not appear to be unstable.    Vitamin B12 deficiency.  I gather this has been replaced.   Return of neurologic function after replacement can take many months, although I am very doubtful that this is a major player here.   Neuropathic pain.  She is on Percocet p.r.n.  This is obviously multifactorial.  She is also on Neurontin, probably for this.    On Seroquel 25 mg p.o. q.h.s. for reasons that are not clear.  She does have a history of depression, on Celexa.    Hypomagnesemia and hypokalemia.  I will recheck these next week.    I will see what I can find on the [facility] computer system of the progression of this illness.  I may be able to reach into the [facility] system, as well, through [employer].  Her respiratory status seems  currently stable.   She is on a #6 cuffless trach.  She is tolerating a Passy-Muir valve well.  As mentioned, she seems to be transitioning to a regular diet.  CPT CODE: 16109

## 2013-04-21 ENCOUNTER — Non-Acute Institutional Stay (SKILLED_NURSING_FACILITY): Payer: BC Managed Care – PPO | Admitting: Internal Medicine

## 2013-04-21 DIAGNOSIS — R300 Dysuria: Secondary | ICD-10-CM

## 2013-04-29 DIAGNOSIS — J9500 Unspecified tracheostomy complication: Secondary | ICD-10-CM | POA: Diagnosis not present

## 2013-04-29 DIAGNOSIS — R633 Feeding difficulties, unspecified: Secondary | ICD-10-CM | POA: Diagnosis not present

## 2013-05-05 ENCOUNTER — Non-Acute Institutional Stay (SKILLED_NURSING_FACILITY): Payer: Medicare Other | Admitting: Adult Health

## 2013-05-05 DIAGNOSIS — R609 Edema, unspecified: Secondary | ICD-10-CM | POA: Diagnosis not present

## 2013-05-05 DIAGNOSIS — G253 Myoclonus: Secondary | ICD-10-CM | POA: Diagnosis not present

## 2013-05-05 DIAGNOSIS — R600 Localized edema: Secondary | ICD-10-CM

## 2013-05-05 DIAGNOSIS — J45909 Unspecified asthma, uncomplicated: Secondary | ICD-10-CM

## 2013-05-05 DIAGNOSIS — G894 Chronic pain syndrome: Secondary | ICD-10-CM | POA: Diagnosis not present

## 2013-05-12 ENCOUNTER — Encounter: Payer: Self-pay | Admitting: Family Medicine

## 2013-05-12 ENCOUNTER — Ambulatory Visit (INDEPENDENT_AMBULATORY_CARE_PROVIDER_SITE_OTHER): Payer: BC Managed Care – PPO | Admitting: Family Medicine

## 2013-05-12 VITALS — BP 100/68 | Ht <= 58 in | Wt 125.0 lb

## 2013-05-12 DIAGNOSIS — Z9889 Other specified postprocedural states: Secondary | ICD-10-CM | POA: Diagnosis not present

## 2013-05-12 DIAGNOSIS — D510 Vitamin B12 deficiency anemia due to intrinsic factor deficiency: Secondary | ICD-10-CM

## 2013-05-12 DIAGNOSIS — Z79899 Other long term (current) drug therapy: Secondary | ICD-10-CM | POA: Diagnosis not present

## 2013-05-12 DIAGNOSIS — Z931 Gastrostomy status: Secondary | ICD-10-CM

## 2013-05-12 MED ORDER — CLONAZEPAM 0.5 MG PO TABS: 0.5000 mg | ORAL_TABLET | Freq: Two times a day (BID) | ORAL | Status: DC | PRN

## 2013-05-12 MED ORDER — OXYCODONE-ACETAMINOPHEN 10-325 MG PO TABS: 1.0000 | ORAL_TABLET | Freq: Four times a day (QID) | ORAL | Status: DC | PRN

## 2013-05-12 MED ORDER — CYANOCOBALAMIN 1000 MCG/ML IJ SOLN
1000.0000 ug | Freq: Once | INTRAMUSCULAR | Status: AC
  Administered 2013-05-12: 1000 ug via INTRAMUSCULAR

## 2013-05-12 NOTE — Progress Notes (Signed)
  Subjective:    Patient ID: [name], female    DOB: March 11, 1966, 47 y.o.   MRN: [id number]  HPIHere for a check up. Patient wants to have feeding tube and trach tube taken out. This patient does have a tracheostomy tube. She relates that Dr. Andrey Campanile in [place] will be taking this out. She has not had any respiratory issues recently. She unfortunately had neuromuscular weakness and 2 episodes or respiratory distress with pneumonia she was treated for these severe issues in the hospital for several weeks plus a nursing home for several weeks. She is gradually gaining her strength back but she is disabled she has spasticity in her upper arms and weakness in her lower legs she is totally dependent on other people in order to help her. Unfortunately she is having significant weakness in addition to difficulty controlling her hands plus severe weakness in the legs. She does have a feeding tube and she would like to have this removed. She states she is eating perfectly fine swallowing and not having any type of choking spells.  Patient wants to know if she needs to keep taking vit b shots. The patient was on vitamin B shots. I told her it would be a good idea to continue these until her neurologist tells her that she could stop.  Requesting med to help her sleep. She is requesting medication to help her sleep she is already on some nerve medicine as well as pain medication. I told her that I thought this was a bad idea. I told her that using sleeping medicine what pain medicine increases her risk of respiratory depression.  Needs refill on percocet-she states that this medication helps her control the neuropathy pain she denies abusing it she states it does not make her drowsy or drug. Her family were helps regulate.  She has underlying neuromuscular weakness that she states the neurologist told her it was severe B12 neuropathy and nerve damage and that she would be permanently this way but they hope that she  will improve to some degree. Review of Systems See above she denies any wheezing difficulty breathing difficulty swallowing vomiting fevers or chills    Objective:   Physical Exam Eardrums normal throat is normal neck is supple she does have a tracheostomy tube in place her lungs are clear she moves air well hearts regular she has spasticity in the upper arms with inability to do fine motor movements. Her abdomen is soft G-tube in place extremities she has positioning/contractures of the lower ankles along with some atrophy issues and weakness.       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 #5 she appears to be doing well with eating and drinking so therefore the G-tube could be removed for this patient. #6 patient does have significant pain discomfort in her back as well as having issues with neuropathic pain it is reasonable for her to use her Percocet but she was cautioned to avoid overusing it or using it if she feels drowsy #7 I do not recommend sleeping medicine for this patient. #8 she will followup in 2-3 months

## 2013-05-14 NOTE — Progress Notes (Signed)
Patient ID: [name], female   DOB: 1966/07/19, 47 y.o.   MRN: [id number]        PROGRESS NOTE  DATE: 04/21/2013  FACILITY:  [facility]  LEVEL OF CARE: SNF (31)  Acute Visit  CHIEF COMPLAINT:  Manage dysuria.    HISTORY OF PRESENT ILLNESS: I was requested by the staff to assess the patient regarding above problem(s):  Staff report that patient is complaining of dysuria since this morning.  Patient does admit to dysuria, but denies hematuria or flank pain.  She does admit to suprapubic pain, as well.    PAST MEDICAL HISTORY : Reviewed.  No changes.  CURRENT MEDICATIONS: Reviewed per [facility]  REVIEW OF SYSTEMS:  GENERAL: no change in appetite, no fatigue, no weight changes, no fever, chills or weakness RESPIRATORY: no cough, SOB, DOE,, wheezing, hemoptysis CARDIAC: no chest pain, edema or palpitations GI: no abdominal pain, diarrhea, constipation, heart burn, nausea or vomiting; does have suprapubic pain  PHYSICAL EXAMINATION  VS:  T 98.3       P 70      RR 18      BP 116/64     POX %       WT (Lb)  GENERAL: no acute distress, normal body habitus NECK: patient has a trach RESPIRATORY: breathing is even & unlabored, BS CTAB CARDIAC: RRR, no murmur,no extra heart sounds, no edema GI: abdomen soft, normal BS, no masses, no tenderness, no hepatomegaly, no splenomegaly, patient has a PEG GU: suprapubic tenderness to palpation PSYCHIATRIC: the patient is alert & oriented to person, affect & behavior appropriate  ASSESSMENT/PLAN:  Dysuria.  New problem.  Check UA, culture and sensitivities.    CPT CODE: 40981

## 2013-05-15 ENCOUNTER — Other Ambulatory Visit: Payer: Self-pay | Admitting: Family Medicine

## 2013-05-15 ENCOUNTER — Telehealth: Payer: Self-pay | Admitting: Family Medicine

## 2013-05-15 DIAGNOSIS — Z9889 Other specified postprocedural states: Secondary | ICD-10-CM

## 2013-05-15 DIAGNOSIS — R300 Dysuria: Secondary | ICD-10-CM | POA: Insufficient documentation

## 2013-05-15 NOTE — Telephone Encounter (Signed)
[name], please confirm with Trudy she wants [facility], referral intiated in the system. In my opinion she should see ENT at [facility] since her neurology is there

## 2013-05-15 NOTE — Telephone Encounter (Signed)
FYI - Dr. Andrey Campanile (ENT in [place]) does not recommend that pt have her trach removed.  He states that he can place a "fresh" one for her but does not recommend having it removed.  If pt insists on having it removed, Dr. Andrey Campanile recommends referring pt to [facility] for removal.

## 2013-05-20 ENCOUNTER — Encounter: Payer: Self-pay | Admitting: Family Medicine

## 2013-05-20 ENCOUNTER — Ambulatory Visit (INDEPENDENT_AMBULATORY_CARE_PROVIDER_SITE_OTHER): Payer: BC Managed Care – PPO | Admitting: Family Medicine

## 2013-05-20 VITALS — Ht <= 58 in | Wt 125.0 lb

## 2013-05-20 DIAGNOSIS — K9420 Gastrostomy complication, unspecified: Secondary | ICD-10-CM

## 2013-05-20 DIAGNOSIS — J040 Acute laryngitis: Secondary | ICD-10-CM

## 2013-05-20 DIAGNOSIS — K9429 Other complications of gastrostomy: Secondary | ICD-10-CM

## 2013-05-20 MED ORDER — DOXYCYCLINE HYCLATE 100 MG PO CAPS: 100.0000 mg | ORAL_CAPSULE | Freq: Two times a day (BID) | ORAL | Status: DC

## 2013-05-20 NOTE — Patient Instructions (Signed)
Warm compresses  Doxy twice a day with food and water

## 2013-05-20 NOTE — Progress Notes (Signed)
  Subjective:    Patient ID: [name], female    DOB: 05-20-1966, 47 y.o.   MRN: [id number]  HPI Patient arrives with an infected feeding tube. Started this weekend with redness around the tube. She relates redness around the tube some tenderness and pain she denies high fever or chills. She has seen ENT locally as well as GI Dr. they did not want to remove her tubes. They requested this be removed from the facility that placement. These were placed during an extended hospitalization at [facility]. Her neurologist is also through [facility].  Review of Systems See above    Objective:   Physical Exam Lungs clear hearts regular she does have cellulitis around the feeding tube.       Assessment & Plan:  A/P-infection of feeding tube area with cellulitis-doxycycline twice a day for the next 10 days if not improving notify us. If high fevers or worse followup.  Will refer to both gastroenterology and ENT at [facility]. It is hopeful that they can arrange to have these removed on the same day. She has been stable since coming out of [facility]. She says neuromuscular condition but she can eat and swallow without difficulty she is maintaining her health without use of the G-tube plus she's been able to breathe fine with no evidence of respiratory distress. Leaving both of these and increases her risk of reoccurring infections

## 2013-05-23 ENCOUNTER — Other Ambulatory Visit: Payer: Self-pay | Admitting: *Deleted

## 2013-05-27 ENCOUNTER — Telehealth: Payer: Self-pay | Admitting: Family Medicine

## 2013-05-27 NOTE — Telephone Encounter (Signed)
Rx called into [facility]. Patient notified.

## 2013-05-27 NOTE — Telephone Encounter (Signed)
Pt needs script for her sterilized water to use for her trach if you could please call that in for her as soon as possible

## 2013-05-30 ENCOUNTER — Other Ambulatory Visit: Payer: Self-pay | Admitting: *Deleted

## 2013-05-30 DIAGNOSIS — R921 Mammographic calcification found on diagnostic imaging of breast: Secondary | ICD-10-CM

## 2013-05-30 NOTE — Telephone Encounter (Signed)
[facility] for pt.  Need to know who placed feeding tube and trach.  [facility] claims to have no record of placing a trach and that they placed a "core track" that the PCP can just "pull out"

## 2013-06-02 ENCOUNTER — Telehealth: Payer: Self-pay | Admitting: *Deleted

## 2013-06-02 NOTE — Telephone Encounter (Signed)
[facility] requesting a script for bilateral ankle braces. Script needs to state bilateral AFO. Please fax to [employer] fax # [phone]

## 2013-06-02 NOTE — Telephone Encounter (Signed)
Please go ahead and order these do to muscle weakness and contractures.

## 2013-06-02 NOTE — Telephone Encounter (Signed)
Order faxed.

## 2013-06-03 NOTE — Telephone Encounter (Signed)
Dr. Lorin Picket - FYI - Spoke with pt 06/02/13, she states that she has appointment with the "trauma unit" on 06/10/13 @ [facility] to discuss removing her trach and feeding tube.  I spent 2 hours on 05/30/13 trying to get pt set up with GI and ENT for removal they however couldn't find record of these being placed at [facility], pt states that the neurologist at [facility] had them placed.

## 2013-06-04 ENCOUNTER — Telehealth: Payer: Self-pay | Admitting: Family Medicine

## 2013-06-04 MED ORDER — CYCLOBENZAPRINE HCL 10 MG PO TABS: 10.0000 mg | ORAL_TABLET | Freq: Every day | ORAL | Status: DC

## 2013-06-04 MED ORDER — CITALOPRAM HYDROBROMIDE 40 MG PO TABS: 40.0000 mg | ORAL_TABLET | Freq: Every day | ORAL | Status: DC

## 2013-06-04 NOTE — Telephone Encounter (Signed)
RX for flexeril, celexa and nicotine patches was sent into [facility]. Patient was notified.

## 2013-06-04 NOTE — Telephone Encounter (Signed)
May have 6 refills each, as for nicotine patches I thought those were otc but may have Rx called in if not otc

## 2013-06-04 NOTE — Telephone Encounter (Signed)
[name] has chart if needed.  Patient would like a prescription for Nictione Patches.  Needs the following refills:   cyclobenzaprine (FLEXERIL) 10 MG tablet citalopram (CELEXA) 40 MG tablet   Please call Patient. Thanks

## 2013-06-13 DIAGNOSIS — G9340 Encephalopathy, unspecified: Secondary | ICD-10-CM | POA: Diagnosis not present

## 2013-06-13 DIAGNOSIS — M6281 Muscle weakness (generalized): Secondary | ICD-10-CM | POA: Diagnosis not present

## 2013-06-13 DIAGNOSIS — J9600 Acute respiratory failure, unspecified whether with hypoxia or hypercapnia: Secondary | ICD-10-CM | POA: Diagnosis not present

## 2013-06-16 NOTE — Telephone Encounter (Signed)
Pt called to state that appt is 06/17/13 @ 3:00 with Trauma Unit at [facility].  Requested we fax note stating Dr. Lorin Picket agrees with removal of trach and feeding tube, pt will pick up to take to appt

## 2013-06-30 ENCOUNTER — Other Ambulatory Visit: Payer: Self-pay | Admitting: *Deleted

## 2013-06-30 ENCOUNTER — Other Ambulatory Visit: Payer: Self-pay | Admitting: Family Medicine

## 2013-06-30 ENCOUNTER — Telehealth: Payer: Self-pay | Admitting: Family Medicine

## 2013-06-30 DIAGNOSIS — M6281 Muscle weakness (generalized): Secondary | ICD-10-CM

## 2013-06-30 MED ORDER — GABAPENTIN 300 MG PO CAPS: ORAL_CAPSULE | ORAL | Status: DC

## 2013-06-30 NOTE — Telephone Encounter (Signed)
It is difficult to know what is causing her hand sensation. If he gets worse I would recommend an office visit. Prescription written for scooter. This often is a very complex process. The first step would be for [place] to refer her to physical therapy for evaluation of wheelchair motorized scooter versus powered wheelchair. Once this is completed then she can let us know and we would write her a prescription for whichever is recommended. Insurances often cover it but often required detailed information. I recommend seeing physical therapy first been following up here 3-7 days later. We will set up physical therapy.

## 2013-06-30 NOTE — Telephone Encounter (Signed)
Pt wants to know if you can we can write a script for a electric wheel chair? Hard for her to use a wheel chair. [facility]    gabapentin (NEURONTIN) 300 MG capsule   pt also needs [place] to send a new script to [facility] for this med, their records don't indicate we  Issue this med   Pt would also like a nurse to talk to her about a possible reaction to a medication

## 2013-06-30 NOTE — Telephone Encounter (Signed)
Pt states her hands are stiff and it feels like something crawling on her fingers for about the last week. She thinks this might be from one of her meds but not sure. She also would like a rx for a 3 wheel scooter.

## 2013-07-01 NOTE — Telephone Encounter (Signed)
Left message on voicemail to return call.

## 2013-07-07 NOTE — Telephone Encounter (Signed)
Patient stated that [facility] had already called her to set up the evaluation and she told them she didn't want to do it at this time. Patient advised to call back if she changes her mind and wants to proceed with the PT evaluation for a motorized scooter.

## 2013-07-09 LAB — BASIC METABOLIC PANEL
Calcium: 9.9 mg/dL (ref 8.4–10.5)
Chloride: 102 mEq/L (ref 96–112)
Creat: 0.64 mg/dL (ref 0.50–1.10)
Sodium: 141 mEq/L (ref 135–145)

## 2013-07-09 LAB — VITAMIN B12: Vitamin B-12: 1281 pg/mL — ABNORMAL HIGH (ref 211–911)

## 2013-07-14 ENCOUNTER — Encounter: Payer: Self-pay | Admitting: Family Medicine

## 2013-07-14 ENCOUNTER — Other Ambulatory Visit: Payer: Self-pay | Admitting: Family Medicine

## 2013-07-14 ENCOUNTER — Ambulatory Visit (INDEPENDENT_AMBULATORY_CARE_PROVIDER_SITE_OTHER): Payer: BC Managed Care – PPO | Admitting: Family Medicine

## 2013-07-14 VITALS — BP 110/72 | Ht <= 58 in | Wt 125.0 lb

## 2013-07-14 DIAGNOSIS — Z23 Encounter for immunization: Secondary | ICD-10-CM

## 2013-07-14 DIAGNOSIS — R5381 Other malaise: Secondary | ICD-10-CM

## 2013-07-14 MED ORDER — MELOXICAM 15 MG PO TABS: 15.0000 mg | ORAL_TABLET | Freq: Every day | ORAL | Status: DC

## 2013-07-14 MED ORDER — OXYCODONE-ACETAMINOPHEN 10-325 MG PO TABS: 1.0000 | ORAL_TABLET | Freq: Four times a day (QID) | ORAL | Status: DC | PRN

## 2013-07-14 MED ORDER — TRIAMCINOLONE ACETONIDE 0.1 % EX CREA: TOPICAL_CREAM | CUTANEOUS | Status: DC

## 2013-07-14 NOTE — Progress Notes (Signed)
  Subjective:    Patient ID: [name], female    DOB: January 10, 1966, 47 y.o.   MRN: [id number]  HPI Patient arrives for a follow up on leg weakness. Also having problems with her hands and wants to discuss process to get a scooter. This patient has severe weakness in the legs. The specialist have fitted her with lower leg braces. She is able to cannulate just a few feet with the assistance of her family. She is unable to walk she is unable to propel herself in a wheelchair with her arms. She does have some dexterity control with her hands but it is difficult for her to do fine motor movements. The neurologist never were able to pinpoint the reason for her problem they thought it was due to B12 deficiency. Recent lab work shows B12 levels very good. She has chronic back pain as well as leg pain she relates to using her pain medicine she denies abusing it. She also states she has a difficult time sleeping. Diphenhydramine and other measures have not helped. She had side effects with the Ambien.  Patient also states she uses her medication for her nerves she denies abusing it. Past medical history include significant muscle weakness, chronic pain, bilateral pedal edema. Family history noncontributory patient no longer smokes   Review of Systems She denies headaches she denies wheezing shortness of breath denies chest pain vomiting she relates continuing fatigue tiredness swelling in the legs when she sits in a chair a lot    Objective:   Physical Exam Neck no masses lungs are clear no crackles heart is regular abdomen is soft extremities trace edema in the lower legs pulses are normal, severe weakness in the lower legs. Patient confined to wheelchair today patient also with some moderate weakness in the upper extremities but she does have the ability to grasp with her hands       Assessment & Plan:  #1 weakness-her B12 level actually currently right now is good she can do oral B12 2000 mcg daily  were recheck a B12 level in 6 months. Also feel that the patient would benefit from seeing neurology at [facility] once yearly. When she follows up in a few months time we will work towards setting her up an appointment for early spring. #2 patient now has a G-tube removed her nutritional status is doing well she does not need this replaced #3 patient had tracheostomy removed and repaired her lungs sound good she was encouraged to get a flu shot today #4 chronic pain prescriptions for pain medicine were given. She will followup again with [place] in 2 months for further evaluation and further prescriptions #5 insomnia-I sympathize with the patient but it's not possible to put her on additional medicines currently #6 I do believe that this patient would benefit from a scooter she has significant weakness in the legs and upper trimming these she cannot use a manual wheelchair. She is not capable of moving more than a couple feet without having family carry her. She R. he [facility]. They will help evaluate how well she could control a scooter. I will write a letter in support for her. Significant time spent with patient today 30 minutes

## 2013-07-16 ENCOUNTER — Telehealth: Payer: Self-pay | Admitting: Family Medicine

## 2013-07-16 MED ORDER — MOMETASONE FUROATE 0.1 % EX CREA: TOPICAL_CREAM | Freq: Every day | CUTANEOUS | Status: DC

## 2013-07-16 NOTE — Telephone Encounter (Signed)
Pt states that the med you gave her for her hands is not working, it helps but not getting rid of the problem, please advise to what she should she do? Try main line first this line secondary [phone]

## 2013-07-16 NOTE — Telephone Encounter (Addendum)
Rx sent electronically to [facility]. Patient notified 

## 2013-07-16 NOTE — Telephone Encounter (Signed)
May try Elocon cream twice daily to help get under control 60 g 3 refills

## 2013-07-23 ENCOUNTER — Telehealth: Payer: Self-pay | Admitting: Family Medicine

## 2013-07-23 NOTE — Telephone Encounter (Signed)
Pt called, stated that she got the braces for her feet and that [facility] recommended pt call us to get a new referral to restart her PT to help her learn to walk with her new braces.  Please advise, if "ok", please initiate in the system, please call the pt to let her know  [phone]

## 2013-07-24 ENCOUNTER — Other Ambulatory Visit: Payer: Self-pay | Admitting: Family Medicine

## 2013-07-24 DIAGNOSIS — R29898 Other symptoms and signs involving the musculoskeletal system: Secondary | ICD-10-CM

## 2013-07-24 NOTE — Telephone Encounter (Signed)
referal put in, please notify pt

## 2013-08-04 ENCOUNTER — Ambulatory Visit (INDEPENDENT_AMBULATORY_CARE_PROVIDER_SITE_OTHER): Payer: BC Managed Care – PPO | Admitting: Family Medicine

## 2013-08-04 ENCOUNTER — Encounter: Payer: Self-pay | Admitting: Family Medicine

## 2013-08-04 VITALS — BP 102/80 | Ht <= 58 in

## 2013-08-04 DIAGNOSIS — IMO0002 Reserved for concepts with insufficient information to code with codable children: Secondary | ICD-10-CM | POA: Diagnosis not present

## 2013-08-04 DIAGNOSIS — M542 Cervicalgia: Secondary | ICD-10-CM | POA: Diagnosis not present

## 2013-08-04 DIAGNOSIS — R29898 Other symptoms and signs involving the musculoskeletal system: Secondary | ICD-10-CM

## 2013-08-04 DIAGNOSIS — M792 Neuralgia and neuritis, unspecified: Secondary | ICD-10-CM

## 2013-08-04 NOTE — Progress Notes (Addendum)
Subjective:    Patient ID: [name], female    DOB: 1966/01/05, 47 y.o.   MRN: [id number]  Headache  This is a recurrent problem. The current episode started 1 to 4 weeks ago. The problem occurs intermittently. The problem has been gradually worsening. The pain does not radiate. The quality of the pain is described as sharp and shooting. The pain is at a severity of 8/10. The pain is moderate. Nothing aggravates the symptoms. She has tried nothing for the symptoms. The treatment provided no relief.   this patient is disabled. She has a fairly unusual problem. She has spasticity as well as tremors in her hands as well as her legs. Her mother had a similar condition. This patient is wheelchair-bound. She cannot control her bowel or bladder. Family takes care of her. She is unable to walk. She is able to support her weight for transfer. She is able to use her arms but dexterity and her fingers is limited. Patient relates over the past several weeks she has had increasing weakness into her arms as well as tremors and also both arms feel like they're falling asleep. Patient has been in the hospital for an extended length of time back in the summer at that time she had MRI of the brain but did not have MRI of her spine. Patient was seen today face to face on 08/04/2013. Part of the reason for the patient's visit was her weakness as to city and being wheelchair-bound. She does have tightness in her muscles in this also causes contractures. This patient would benefit from ongoing home health nursing to monitor her condition medications and progress/declining. In addition to this physical therapy including physical therapy and occupational therapy would be beneficial to try to help her gain strength and more independence. My clinical findings that support the need to the services is severe weakness in the arms and the legs along with spasticity and inability to care for herself and I wholly certified that this  patient is totally homebound because of severe weakness being wheelchair-bound and dependent on others and therefore she is homebound!! It should be noted that this patient does have significant contractures in the legs including the ankles and the feet that require physical therapy and require special equipment in order to maintain independence and help regain strength and function. Patient states that she is also experiencing bilateral hand pain. She states that it has been going since August 2014.   Past medical history see previous notes see above, family history noncontributory  Review of Systems  Neurological: Positive for headaches.   she states she is having headaches in the back of her head. She also relates she is taking her standard medication and denies abusing it.     Objective:   Physical Exam  Lungs are clear heart is regular extremities trace edema patient has tremors in her hands as well as weakness in her arms and subjective pain in her neck as well as into both arms She has significant weakness in the legs along with some contractures in the ankles and feet with difficulty in range of motion.     Assessment & Plan:  #1 neuropathy-we will increase Neurontin. She will take this over the course of next several weeks and see how this does for her #2 with the weakness she has in her arms as well as neck pain and no previous history of MRI of the neck I will set up for MRI of the neck.  She does have previous surgery of her neck as well. I am concerned about this patient. I told her she needs to go ahead and talk with her neurologist at wait for [place] to set up a followup. Hopefully they can figure out a different avenue to help her. #3-I support this patient for a scooter. It is possible that her insurance might make her utilize physical therapy as an evaluation for scooter before they will pay for it she understands this.

## 2013-08-05 ENCOUNTER — Other Ambulatory Visit: Payer: Self-pay | Admitting: Family Medicine

## 2013-08-06 ENCOUNTER — Ambulatory Visit (HOSPITAL_COMMUNITY): Payer: BC Managed Care – PPO

## 2013-08-07 ENCOUNTER — Telehealth: Payer: Self-pay | Admitting: *Deleted

## 2013-08-07 DIAGNOSIS — J45909 Unspecified asthma, uncomplicated: Secondary | ICD-10-CM

## 2013-08-07 MED ORDER — ALBUTEROL SULFATE HFA 108 (90 BASE) MCG/ACT IN AERS: 2.0000 | INHALATION_SPRAY | Freq: Four times a day (QID) | RESPIRATORY_TRACT | Status: DC | PRN

## 2013-08-07 NOTE — Telephone Encounter (Signed)
I did in fact speak with her about this. She is already on multiple medicines that can affect alertness. She is also on multiple medicines that can cause respiratory depression. We cannot go up on this medication it would increase the risk of a accidental overdose.

## 2013-08-07 NOTE — Telephone Encounter (Signed)
[facility] called and stated the scooter was denied because pt has medicare as secondary. Patient notified. Pt advised to call insurance company to see which supply company they use and we will send over the info for the scooter

## 2013-08-07 NOTE — Telephone Encounter (Signed)
Discussed with pt

## 2013-08-07 NOTE — Telephone Encounter (Signed)
Pt stated she talked to you about increasing her anxiety med while at office visit. But never got RX. She is taking klonopin 0.5 BID currently. [facility]

## 2013-08-11 DIAGNOSIS — R259 Unspecified abnormal involuntary movements: Secondary | ICD-10-CM | POA: Diagnosis not present

## 2013-08-11 DIAGNOSIS — M6281 Muscle weakness (generalized): Secondary | ICD-10-CM | POA: Diagnosis not present

## 2013-08-11 DIAGNOSIS — R269 Unspecified abnormalities of gait and mobility: Secondary | ICD-10-CM | POA: Diagnosis not present

## 2013-08-11 DIAGNOSIS — J449 Chronic obstructive pulmonary disease, unspecified: Secondary | ICD-10-CM | POA: Diagnosis not present

## 2013-09-03 NOTE — Progress Notes (Signed)
This therapist not working this day; however did receive a text message relating to this patient and the note written.  Ignacia Palma, OTR/L [phone] 09/03/2013

## 2013-09-09 ENCOUNTER — Ambulatory Visit (HOSPITAL_COMMUNITY)
Admission: RE | Admit: 2013-09-09 | Discharge: 2013-09-09 | Disposition: A | Discharge status: Home / Self Care | Payer: BC Managed Care – PPO | Source: Ambulatory Visit | Attending: Family Medicine | Admitting: Family Medicine

## 2013-09-09 DIAGNOSIS — R270 Ataxia, unspecified: Secondary | ICD-10-CM

## 2013-09-09 DIAGNOSIS — J4489 Other specified chronic obstructive pulmonary disease: Secondary | ICD-10-CM | POA: Insufficient documentation

## 2013-09-09 DIAGNOSIS — R279 Unspecified lack of coordination: Secondary | ICD-10-CM | POA: Insufficient documentation

## 2013-09-09 DIAGNOSIS — Z9181 History of falling: Secondary | ICD-10-CM | POA: Insufficient documentation

## 2013-09-09 DIAGNOSIS — R29898 Other symptoms and signs involving the musculoskeletal system: Secondary | ICD-10-CM

## 2013-09-09 DIAGNOSIS — J449 Chronic obstructive pulmonary disease, unspecified: Secondary | ICD-10-CM | POA: Insufficient documentation

## 2013-09-09 DIAGNOSIS — R262 Difficulty in walking, not elsewhere classified: Secondary | ICD-10-CM | POA: Insufficient documentation

## 2013-09-09 DIAGNOSIS — IMO0001 Reserved for inherently not codable concepts without codable children: Secondary | ICD-10-CM | POA: Insufficient documentation

## 2013-09-09 DIAGNOSIS — R269 Unspecified abnormalities of gait and mobility: Secondary | ICD-10-CM | POA: Insufficient documentation

## 2013-09-09 NOTE — Evaluation (Addendum)
Physical Therapy Evaluation  Patient Details  Name: [name] MRN: [id number] Date of Birth: 05-08-1966  Today's Date: 09/09/2013 Time: 6213-0865 PT Time Calculation (min): 39 min              Visit#: 1 of 12  Re-eval: 10/09/13 Assessment Diagnosis:  (ataxia) Next MD Visit: 09/12/2013 Prior Therapy: HH  Authorization: BCBS     Past Medical History:  Past Medical History  Diagnosis Date  . Anxiety   . Depression   . COPD (chronic obstructive pulmonary disease)   . MS (multiple sclerosis)   . Fibromyalgia   . Impaired fasting glucose   . 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 Eating disorder   . Chronic low back pain   . PONV (postoperative nausea and vomiting)   . Chronic bronchitis     "yearly" (02/11/2013)  . Borderline diabetes   . GERD (gastroesophageal reflux disease)   . Migraine     "I use to" (02/11/2013)  . Arthritis     "hands & feet" (02/11/2013)  . Peripheral neuropathy     [name] 02/11/2013  . B12 deficiency anemia     [name] 02/11/2013  . Chronic pain syndrome     [name] 02/11/2013  . UTI (lower urinary tract infection) 02/11/2013    [name] 02/11/2013  . Chronic edema     BLE/notes 02/11/2013   Past Surgical History:  Past Surgical History  Procedure Laterality Date  . Abdominal hysterectomy  ~ 1987; ~ 2004    "[place]; [place]" (02/11/2013)  . Cesarean section  7846; 1984; 1986  . Anterior cervical decomp/discectomy fusion  2009; 2011  . Tonsillectomy  1990's?  . Cataract extraction w/phaco  06/20/2011    Procedure: CATARACT EXTRACTION PHACO AND INTRAOCULAR LENS PLACEMENT (IOC);  Surgeon: [name] T. Nile Riggs;  Location: [facility];  Service: Ophthalmology;  Laterality: Left;  CDE 1.81  . Cataract extraction w/phaco  07/04/2011    Procedure: CATARACT EXTRACTION PHACO AND INTRAOCULAR LENS PLACEMENT (IOC);  Surgeon: [name] T. Nile Riggs;  Location: [facility];  Service: Ophthalmology;  Laterality: Right;  CDE: 1.76  . Yag laser application Right  12/03/2012    Procedure: YAG LASER APPLICATION;  Surgeon: [name] T. Nile Riggs, MD;  Location: [facility];  Service: Ophthalmology;  Laterality: Right;  . Yag laser application Left 12/17/2012    Procedure: YAG LASER APPLICATION;  Surgeon: [name] T. Nile Riggs, MD;  Location: [facility];  Service: Ophthalmology;  Laterality: Left;  . Appendectomy      Subjective Symptoms/Limitations Symptoms: [name] states that she began getting sick in April of this year.  She progressed to the point she could not walk or use her hands.  She ended up going to two nursing homes and then went home with [facility].  She has braces on both LE.  She was discharged from home health on 09/03/2013 and is now being referred to outpatient PT to maximize her functional potential.  She is currently able to walk with a walker with assistance for short distances,(163ft). Pertinent History: 2 cervical surgeries last surgery 1998 How long can you sit comfortably?: no problem How long can you stand comfortably?: three attempts to stand and is unable to stand I  How long can you walk comfortably?: with walker and min assist able to walk for  Pain Assessment Currently in Pain?: Yes Pain Score: 6  Pain Location: Foot Pain Orientation: Right;Left Pain Type: Chronic pain Pain Onset: More than a month ago Pain Frequency: Constant Pain  Relieving Factors: pain meds and neurotin Effect of Pain on Daily Activities: no change  Precautions/Restrictions  Precautions Precautions: Fall  Balance Screening Balance Screen Has the patient fallen in the past 6 months: Yes How many times?:  (multiple) Has the patient had a decrease in activity level because of a fear of falling? : Yes  Prior Functioning  Prior Function Vocation: On disability (cervical )  Cognition/Observation Observation/Other Assessments Other Assessments: decreased proprioception with 4/5 errors.  Sensation/Coordination/Flexibility/Functional Tests Coordination Gross Motor  Movements are Fluid and Coordinated: No Heel Shin Test: - Functional Tests Functional Tests: two minute ambulation = 120 ft. Functional Tests: unable to come sit to stand I   Assessment RLE Strength Right Hip Flexion: 5/5 Right Hip Extension: 3+/5 Right Hip ABduction: 5/5 Right Knee Flexion: 5/5 Right Knee Extension: 5/5 Right Ankle Dorsiflexion: 3+/5 (in available range (-23)) LLE Strength Left Hip Flexion: 5/5 Left Hip Extension: 3-/5 Left Hip ABduction: 5/5 Left Knee Flexion:  (5-/5) Left Knee Extension: 4/5 Left Ankle Dorsiflexion: 3+/5      Physical Therapy Assessment and Plan PT Assessment and Plan Clinical Impression Statement: Pt referred to physical therapy for ataxia.  Pt demonstrates decreased coordination, decreased balance and decreased proprioception.  Pt will benefit from skilled PT to address the above deficits. Rehab Potential: Good PT Frequency: Min 3X/week PT Duration: 4 weeks PT Treatment/Interventions: Functional mobility training;Balance training;Patient/family education;Other (comment) PT Plan: begin working on coordination, proprioception and balance.  Pt may benefit from crawling on mat, tall kneel on mat, balance activities...    Goals Home Exercise Program Pt/caregiver will Perform Home Exercise Program: For improved balance PT Goal: Perform Home Exercise Program - Progress: Goal set today PT Short Term Goals Time to Complete Short Term Goals: 2 weeks PT Short Term Goal 1: Pt to be able to come from sit to stand on second attempt PT Short Term Goal 2: Pt to be able to stand I x 5 seconds with no UE assist PT Short Term Goal 3: Pt to be able to walk 200 feet in an two minute period of time PT Long Term Goals Time to Complete Long Term Goals: 4 weeks PT Long Term Goal 1: Pt proprioception improved to correct 3/5 times PT Long Term Goal 2: Pt to be able to come sit to stand on one attempt Long Term Goal 3: Pt to be able to ambulate 250 ft in two  minutes Long Term Goal 4: Pt to be able to walk for 15 minute wihout stopping  Problem List Patient Active Problem List   Diagnosis Date Noted  . Ataxia 09/09/2013  . Difficulty in walking(719.7) 09/09/2013  . Weakness of both legs 09/09/2013  . UTI (urinary tract infection) 02/11/2013  . Generalized weakness 02/11/2013  . Hypokalemia 02/11/2013  . Tobacco abuse 01/14/2013  . Bilateral leg edema 01/14/2013  . Pedal edema 01/13/2013  . Hyponatremia 01/13/2013  . Chronic pain syndrome 12/30/2012  . Reactive airway disease 12/30/2012  . Encephalopathy acute 07/25/2011  . 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 Drug withdrawal 07/25/2011  . CHEST PAIN UNSPECIFIED 04/15/2009    PT - End of Session Equipment Utilized During Treatment: Gait belt Activity Tolerance: Patient tolerated treatment well General Behavior During Therapy: WFL for tasks assessed/performed PT Plan of Care PT Home Exercise Plan: Pt has from [facility]  GP Functional Assessment Tool Used: clinical judgement Functional Limitation: Mobility: Walking and moving around Mobility: Walking and Moving Around Current Status (B1478): At least 80 percent but less than 100  percent impaired, limited or restricted Mobility: Walking and Moving Around Goal Status [phone]): At least 60 percent but less than 80 percent impaired, limited or restricted  RUSSELL,CINDY 09/09/2013, 4:48 PM  Physician Documentation Your signature is required to indicate approval of the treatment plan as stated above.  Please sign and either send electronically or make a copy of this report for your files and return this physician signed original.   Please mark one 1.__approve of plan  2. ___approve of plan with the following conditions.   ______________________________                                                          _____________________ Physician Signature                                                                                                              Date

## 2013-09-11 ENCOUNTER — Ambulatory Visit (HOSPITAL_COMMUNITY)
Admission: RE | Admit: 2013-09-11 | Discharge: 2013-09-11 | Disposition: A | Discharge status: Home / Self Care | Payer: BC Managed Care – PPO | Source: Ambulatory Visit | Attending: Family Medicine | Admitting: Family Medicine

## 2013-09-11 NOTE — Progress Notes (Signed)
Physical Therapy Treatment Patient Details  Name: [name] MRN: [id number] Date of Birth: 1966/03/15  Today's Date: 09/11/2013 Time: 1436-1510 PT Time Calculation (min): 34 min Charges: NMR x 32'  Visit#: 2 of 12  Re-eval: 10/09/13  Authorization: BCBS   Subjective: Symptoms/Limitations Symptoms: Pt states that she has been working on her exercises at home. Pain Assessment Currently in Pain?: Yes Pain Score: 6  Pain Location:  (B hands and feet) Pain Orientation: Right;Left Pain Type: Chronic pain  Exercise/Treatments Balance Exercises Standing Standing Eyes Opened: Wide (BOA);Head turns;5 reps Marching: 10 reps;Hand held assist (HHA) 2;Solid surface Sit to Stand: Limitations Sit to Stand Limitations: x 5 finding balacne withotu UE assistance Other Standing Exercises: Weight shift R/L A/P no UE assist 10 x each; Statis standing with BUE flexion x 10 Other Standing Exercises: Gait 100' with rolling walker on solid surface with min assist and vc's to maintain balance and avoid scissoring   Seated Other Seated Exercises: hip flexion x 10 LAQ x 10 Trunk rotation x 10 no UE assist with feet unsupported   Physical Therapy Assessment and Plan PT Assessment and Plan Clinical Impression Statement: Began NMR activities to improve proprioceptive control. Pt's COG appears to be behind BOS therefore pt tends to lose balance posteriorly. Pt displays improved proprioceptive control when she is cued to increase concentration. Pt displays low activity tolerance requires frequent rest breaks. Rehab Potential: Good PT Frequency: Min 3X/week PT Duration: 4 weeks PT Treatment/Interventions: Functional mobility training;Balance training;Patient/family education;Other (comment) PT Plan: Continue to progress coordination, proprioception and balance.  Pt may benefit from crawling on mat, tall kneel on mat, balance activities.    Problem List Patient Active Problem List   Diagnosis Date  Noted  . Ataxia 09/09/2013  . Difficulty in walking(719.7) 09/09/2013  . Weakness of both legs 09/09/2013  . UTI (urinary tract infection) 02/11/2013  . Generalized weakness 02/11/2013  . Hypokalemia 02/11/2013  . Tobacco abuse 01/14/2013  . Bilateral leg edema 01/14/2013  . Pedal edema 01/13/2013  . Hyponatremia 01/13/2013  . Chronic pain syndrome 12/30/2012  . Reactive airway disease 12/30/2012  . Encephalopathy acute 07/25/2011  . 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 Drug withdrawal 07/25/2011  . CHEST PAIN UNSPECIFIED 04/15/2009    PT - End of Session Equipment Utilized During Treatment: Gait belt Activity Tolerance: Patient tolerated treatment well General Behavior During Therapy: [facility] for tasks assessed/performed  Seth Bake, PTA 09/11/2013, 4:48 PM

## 2013-09-12 ENCOUNTER — Ambulatory Visit: Payer: BC Managed Care – PPO | Admitting: Family Medicine

## 2013-09-12 ENCOUNTER — Encounter: Payer: Self-pay | Admitting: Family Medicine

## 2013-09-12 ENCOUNTER — Ambulatory Visit (INDEPENDENT_AMBULATORY_CARE_PROVIDER_SITE_OTHER): Payer: BC Managed Care – PPO | Admitting: Family Medicine

## 2013-09-12 VITALS — BP 96/70 | Ht <= 58 in | Wt 130.0 lb

## 2013-09-12 DIAGNOSIS — Z0189 Encounter for other specified special examinations: Secondary | ICD-10-CM

## 2013-09-12 DIAGNOSIS — R7401 Elevation of levels of liver transaminase levels: Secondary | ICD-10-CM | POA: Diagnosis not present

## 2013-09-12 DIAGNOSIS — R739 Hyperglycemia, unspecified: Secondary | ICD-10-CM | POA: Insufficient documentation

## 2013-09-12 DIAGNOSIS — R5381 Other malaise: Secondary | ICD-10-CM | POA: Diagnosis not present

## 2013-09-12 DIAGNOSIS — R7309 Other abnormal glucose: Secondary | ICD-10-CM

## 2013-09-12 DIAGNOSIS — D510 Vitamin B12 deficiency anemia due to intrinsic factor deficiency: Secondary | ICD-10-CM | POA: Diagnosis not present

## 2013-09-12 DIAGNOSIS — D649 Anemia, unspecified: Secondary | ICD-10-CM

## 2013-09-12 DIAGNOSIS — R531 Weakness: Secondary | ICD-10-CM | POA: Insufficient documentation

## 2013-09-12 DIAGNOSIS — Z79899 Other long term (current) drug therapy: Secondary | ICD-10-CM

## 2013-09-12 MED ORDER — OXYCODONE-ACETAMINOPHEN 10-325 MG PO TABS: 1.0000 | ORAL_TABLET | Freq: Four times a day (QID) | ORAL | Status: DC | PRN

## 2013-09-12 MED ORDER — CITALOPRAM HYDROBROMIDE 20 MG PO TABS: 20.0000 mg | ORAL_TABLET | Freq: Every day | ORAL | Status: DC

## 2013-09-12 NOTE — Patient Instructions (Signed)
Reduce your Celexa to 20 mg ( new script sent to [facility])  Stop seroquel  Use percocet no greater than 4 times a day for pain  Reduce Klonopin to 1/2 tablet twice a day AS NEEDED for anxiety  Do your labs  We will set up appt with Dr Renne Crigler and call you  Recheck here in 3 months

## 2013-09-12 NOTE — Progress Notes (Signed)
   Subjective:    Patient ID: [name], female    DOB: 30-Apr-1966, 47 y.o.   MRN: [id number]  HPI Patient is here today for her fibromylagia follow up visit. She states she has no concerns at this time. Stated that she is doing well for the time being.  Patient has chronic pain and uses Percocet 4 times daily helps her greatly allows the pain from the neuropathy to do better  She relates she is gaining some strength although she still has significant difficulty she is disabled she cannot do any significant walking. She states her moods overall are doing better she denies being depressed currently she would like to reduce some of her medicines.  Past medical history neuromuscular weakness she has not seen a neurologist in several months  Family history noncontributory she has a caretaker helping her which is absolutely necessary  Review of Systems Denies chest pain shortness breath vomiting diarrhea swelling in legs    Objective:   Physical Exam Lungs are clear heart is regular pulse normal blood pressure good extremities no edema skin warm dry       Assessment & Plan:  #1 depression actually doing much better reduce Celexa from 40 mg to 20 mg. Also reduce Klonopin to a half a tablet twice daily as necessary. In addition to this stopped Seroquel.  #2 neuromuscular weakness-she is low bit better and her strength in arms and legs but she is still disabled. This patient would benefit from followup consultation with the neurologist. I also believe that she would benefit from a motorized scooter but she is checking into the possibility of getting this  #3 pernicious anemia -- recheck B12 level patient take an oral supplement may need to do injections depending on results. CBC pending.  #4 elevated transaminase-check liver profile  #5 hyperglycemia check hemoglobin A1c  #6 chronic pain-medication does take the edge off discomfort helps her be more functional she is to followup in 3  months prescriptions were given.

## 2013-09-15 ENCOUNTER — Telehealth (HOSPITAL_COMMUNITY): Payer: Self-pay

## 2013-09-15 ENCOUNTER — Inpatient Hospital Stay (HOSPITAL_COMMUNITY)
Admission: RE | Admit: 2013-09-15 | Payer: BC Managed Care – PPO | Source: Ambulatory Visit | Admitting: Physical Therapy

## 2013-09-17 ENCOUNTER — Ambulatory Visit (HOSPITAL_COMMUNITY)
Admission: RE | Admit: 2013-09-17 | Discharge: 2013-09-17 | Disposition: A | Discharge status: Home / Self Care | Payer: BC Managed Care – PPO | Source: Ambulatory Visit | Attending: Family Medicine | Admitting: Family Medicine

## 2013-09-17 ENCOUNTER — Other Ambulatory Visit: Payer: Self-pay | Admitting: Family Medicine

## 2013-09-17 DIAGNOSIS — R270 Ataxia, unspecified: Secondary | ICD-10-CM

## 2013-09-17 DIAGNOSIS — R262 Difficulty in walking, not elsewhere classified: Secondary | ICD-10-CM

## 2013-09-17 DIAGNOSIS — R29898 Other symptoms and signs involving the musculoskeletal system: Secondary | ICD-10-CM

## 2013-09-17 NOTE — Progress Notes (Signed)
Physical Therapy Treatment Patient Details  Name: ROSSETTA KAMA MRN: [id number] Date of Birth: November 29, 1965  Today's Date: 09/17/2013 Time: 1308-6578 PT Time Calculation (min): 47 min Neuromuscular re ed [id number] Visit#: 3 of 12  Re-eval: 10/09/13    Authorization: BCBS   Subjective: Symptoms/Limitations Symptoms: I can tell that I'm able to do things easier at home.   Pain Assessment Currently in Pain?: Yes Pain Score: 7  Pain Location: Back    Exercise/Treatments  Balance Exercises Standing Tandem Stance: Eyes open;2 reps;Limitations Tandem Stance Limitations: foot forward but not heel toe. Sidestepping: 2 reps Cone Rotation: Solid surface;Right turn;Left turn Marching: 10 reps;Hand held assist (HHA) 2;Solid surface Heel Raises: 10 reps Toe Raise: 10 reps;Limitations Toe Raise Limitations: attempted but unable  Sit to Stand: Limitations Sit to Stand Limitations: x10 Other Standing Exercises: Weight shift R/L A/P no UE assist 10 x each; Statis standing with BUE flexion x 10     Seated Other Seated Exercises: Nustep L 2 x 7' hils   Supine       Physical Therapy Assessment and Plan PT Assessment and Plan Clinical Impression Statement: Pt completed therapist facilitated balance activites to promote weight shifting and awareness of center of gravity.  Added cone rotation and tandem stance activities PT Plan: Continue to progress coordination, proprioception and balance.  Pt may benefit from crawling on mat, tall kneel on mat, balance activities.    Goals    Problem List Patient Active Problem List   Diagnosis Date Noted  . Pernicious anemia 09/12/2013  . Hyperglycemia 09/12/2013  . Elevated transaminase level 09/12/2013  . Weakness 09/12/2013  . Ataxia 09/09/2013  . Difficulty in walking(719.7) 09/09/2013  . Weakness of both legs 09/09/2013  . Generalized weakness 02/11/2013  . Tobacco abuse 01/14/2013  . Bilateral leg edema 01/14/2013  . Pedal edema  01/13/2013  . Chronic pain syndrome 12/30/2012  . Reactive airway disease 12/30/2012  . Encephalopathy acute 07/25/2011  . 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

## 2013-09-17 NOTE — Progress Notes (Signed)
Patient ID: [name], female   DOB: July 26, 1966, 47 y.o.   MRN: [id number]     [facility]  Allergies  Allergen Reactions  . Ambien [Zolpidem Tartrate]   . Ceftin [Cefuroxime Axetil] Nausea And Vomiting  . Hctz [Hydrochlorothiazide]     Urinary retention  . Hydrocodone Nausea Only  . Lodine [Etodolac] Hives  . Promethazine Other (See Comments)    Hallucinations.   . Roxicodone [Oxycodone Hcl] Swelling  . Codeine Rash  . Latex Rash  . Penicillins Rash    Chief Complaint  Patient presents with  . Discharge Note    HPI She is being discharged to home with home health for pt/ot/nursing st/rt/sw. She will need trach care supplies; suction equipment and trach care kits. She will not need further dme. Will need prescriptions to be written.   Past Medical History  Diagnosis Date  . Anxiety   . Depression   . COPD (chronic obstructive pulmonary disease)   . MS (multiple sclerosis)   . Fibromyalgia   . Impaired fasting glucose   . 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 Eating disorder   . Chronic low back pain   . PONV (postoperative nausea and vomiting)   . Chronic bronchitis     "yearly" (02/11/2013)  . Borderline diabetes   . GERD (gastroesophageal reflux disease)   . Migraine     "I use to" (02/11/2013)  . Arthritis     "hands & feet" (02/11/2013)  . Peripheral neuropathy     Hattie Perch 02/11/2013  . B12 deficiency anemia     Hattie Perch 02/11/2013  . Chronic pain syndrome     Hattie Perch 02/11/2013  . UTI (lower urinary tract infection) 02/11/2013    Hattie Perch 02/11/2013  . Chronic edema     BLE/notes 02/11/2013    Past Surgical History  Procedure Laterality Date  . Abdominal hysterectomy  ~ 1987; ~ 2004    "[place]; [place]" (02/11/2013)  . Cesarean section  8469; 1984; 1986  . Anterior cervical decomp/discectomy fusion  2009; 2011  . Tonsillectomy  1990's?  . Cataract extraction w/phaco  06/20/2011    Procedure: CATARACT EXTRACTION PHACO AND  INTRAOCULAR LENS PLACEMENT (IOC);  Surgeon: [name] T. Nile Riggs;  Location: [facility];  Service: Ophthalmology;  Laterality: Left;  CDE 1.81  . Cataract extraction w/phaco  07/04/2011    Procedure: CATARACT EXTRACTION PHACO AND INTRAOCULAR LENS PLACEMENT (IOC);  Surgeon: [name] T. Nile Riggs;  Location: [facility];  Service: Ophthalmology;  Laterality: Right;  CDE: 1.76  . Yag laser application Right 12/03/2012    Procedure: YAG LASER APPLICATION;  Surgeon: [name] T. Nile Riggs, MD;  Location: [facility];  Service: Ophthalmology;  Laterality: Right;  . Yag laser application Left 12/17/2012    Procedure: YAG LASER APPLICATION;  Surgeon: [name] T. Nile Riggs, MD;  Location: [facility];  Service: Ophthalmology;  Laterality: Left;  . Appendectomy      Filed Vitals:   05/05/13 1606  BP: 126/76  Pulse: 72  Height: 4\' 11"  (1.499 m)  Weight: 128 lb (58.06 kg)    MEDICATIONS neurontin 600 mg twice daily and 300 in the afternoon demadex 20 mg daily Flexeril 10 mg nightly Colace twice daily mvi daily seroquel 25 mg nightly celexa 40 mg daily K+ 20 meq daily Senna s daily Thiamine daily Klonopin 0.5 mg bid prn Percocet 10/325 mg every 6 hours as needed   LABS REVIEWED  04-24-13: urine culture; no growth    Review of Systems  Constitutional: Negative for malaise/fatigue.  Respiratory: Negative for cough and shortness of breath.        Has trach  Cardiovascular: Negative for chest pain and palpitations.  Gastrointestinal: Negative for heartburn, abdominal pain and constipation.       Has peg tube  Musculoskeletal: Negative for joint pain and myalgias.  Skin: Negative.   Neurological: Negative for headaches.  Psychiatric/Behavioral: Negative for depression. The patient is not nervous/anxious.      Physical Exam  Constitutional: She is oriented to person, place, and time. No distress.  thin  Neck: Neck supple. No JVD present.  Cardiovascular: Normal rate, regular rhythm and intact distal pulses.   Respiratory:  Effort normal and breath sounds normal. No respiratory distress. She has no wheezes.  trach  GI: Soft. Bowel sounds are normal. She exhibits no distension. There is no tenderness.  Peg tube  Musculoskeletal: Normal range of motion. She exhibits edema.  Neurological: She is alert and oriented to person, place, and time.  Skin: Skin is warm and dry. She is not diaphoretic.  Psychiatric: She has a normal mood and affect.     ASSESSMENT/PLAN  Will discharge her home with home health for all of her many needs including trach care supplies; suction equipment; and trach care kits. Will need home health for pt/ot/nursing/st/rt/sw. Prescriptions have been written  Time spent with patient 45 minutes.

## 2013-09-18 ENCOUNTER — Ambulatory Visit (HOSPITAL_COMMUNITY)
Admission: RE | Admit: 2013-09-18 | Discharge: 2013-09-18 | Disposition: A | Discharge status: Home / Self Care | Payer: BC Managed Care – PPO | Source: Ambulatory Visit | Attending: Family Medicine | Admitting: Family Medicine

## 2013-09-18 DIAGNOSIS — R29898 Other symptoms and signs involving the musculoskeletal system: Secondary | ICD-10-CM

## 2013-09-18 DIAGNOSIS — R270 Ataxia, unspecified: Secondary | ICD-10-CM

## 2013-09-18 DIAGNOSIS — R262 Difficulty in walking, not elsewhere classified: Secondary | ICD-10-CM

## 2013-09-18 NOTE — Progress Notes (Signed)
Physical Therapy Treatment Patient Details  Name: [name] MRN: [id number] Date of Birth: Jul 28, 1966  Today's Date: 09/18/2013 Time: 1440 (Pt 10 minutes late)-1523 PT Time Calculation (min): 43 min Charge neuro reed [phone]; there ex 1513-1523 Visit#: 4 of 12  Re-eval: 10/09/13    Authorization: BCBS      Subjective: Symptoms/Limitations Symptoms: Pt states she is still tired from yesterdays treatment Pain Assessment Currently in Pain?: Yes Pain Score: 6  Pain Location: Back Pain Orientation: Right;Left Pain Type: Chronic pain   Exercise/Treatments  Balance Exercises Standing Tandem Stance: Eyes open;2 reps;Limitations Tandem Stance Limitations: foot forward but not heel toe. Cone Rotation: Solid surface;Right turn;Left turn Marching: 10 reps;Hand held assist (HHA) 2;Solid surface Heel Raises: 10 reps Toe Raise: 10 reps;Limitations Toe Raise Limitations: attempted but unable  Sit to Stand: Limitations Sit to Stand Limitations: x10 Other Standing Exercises: Weight shift R/L A/P no UE assist 10 x each; Statis standing with BUE flexion x 10 Other Standing Exercises: Kick ball B     Seated Other Seated Exercises: crawling perimeter of mat; tall kneeling forward/retro working on weight shift and step length. Other Seated Exercises: Nustep L 2 x 7' hils        Physical Therapy Assessment and Plan PT Assessment and Plan Clinical Impression Statement: Pt completed therapist facilitated balance activities to promote proper weight shifteing and body awareness.  Added crawling and tall kneeling activiities; pt able to complete crawling without difficulty but needed facilitation for tall kneeling tasks. PT Treatment/Interventions: Functional mobility training;Balance training;Patient/family education;Other (comment) PT Plan: May discontinue crawling activities but pt will continue to benefit from tall kneeling activities.         Problem List Patient Active  Problem List   Diagnosis Date Noted  . Pernicious anemia 09/12/2013  . Hyperglycemia 09/12/2013  . Elevated transaminase level 09/12/2013  . Weakness 09/12/2013  . Ataxia 09/09/2013  . Difficulty in walking(719.7) 09/09/2013  . Weakness of both legs 09/09/2013  . Generalized weakness 02/11/2013  . Tobacco abuse 01/14/2013  . Bilateral leg edema 01/14/2013  . Pedal edema 01/13/2013  . Chronic pain syndrome 12/30/2012  . Reactive airway disease 12/30/2012  . Encephalopathy acute 07/25/2011  . 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       GP    Kavan Devan,CINDY 09/18/2013, 4:53 PM

## 2013-09-22 ENCOUNTER — Ambulatory Visit (HOSPITAL_COMMUNITY)
Admission: RE | Admit: 2013-09-22 | Discharge: 2013-09-22 | Disposition: A | Discharge status: Home / Self Care | Payer: BC Managed Care – PPO | Source: Ambulatory Visit | Attending: Family Medicine | Admitting: Family Medicine

## 2013-09-22 ENCOUNTER — Encounter: Payer: Self-pay | Admitting: Family Medicine

## 2013-09-22 NOTE — Progress Notes (Addendum)
Physical Therapy Treatment Patient Details  Name: [name] MRN: [id number] Date of Birth: Dec 14, 1965  Today's Date: 09/22/2013 Time: 7829-5621 PT Time Calculation (min): 48 min  Visit#: 5 of 12  Re-eval: 10/09/13 Authorization: BCBS  Charges:  Gait 25', NMR 20'  Subjective: Pt states she's been walking with her walker at home with help.  States she would like to be able to walk Christmas eve.  States she is having difficulty with writing her name.   Exercise/Treatments Balance Exercises Standing Standing Eyes Opened: Limitations Standing Eyes Opened Limitations: no UE's at parallel bars, 2X 3 minutes max Sidestepping: 1 rep Heel Raises: 10 reps Sit to Stand Limitations: x10 19" height no UE's Other Standing Exercises: gait with QC X 125'   Physical Therapy Assessment and Plan PT Assessment and Plan Clinical Impression Statement: Began ambulation with QC working on step length, normal gait stride and balance.  Pt required min assist to complete using QC, several LOB able to self correct.  Pt able to stand without UE's max of 3 minutes before fatigue.  Began sit to stand task without use of UE's from 19 inch height.  Pt expressed concern with inability to write name or peform other fine motor tasks.  Discussed with evaluating PT and OK to send OT evaluation request. PT Plan: Progress tall kneeling and balance activities.  Await OT order and schedule when received.    Goals    Problem List Patient Active Problem List   Diagnosis Date Noted  . Pernicious anemia 09/12/2013  . Hyperglycemia 09/12/2013  . Elevated transaminase level 09/12/2013  . Weakness 09/12/2013  . Ataxia 09/09/2013  . Difficulty in walking(719.7) 09/09/2013  . Weakness of both legs 09/09/2013  . Generalized weakness 02/11/2013  . Tobacco abuse 01/14/2013  . Bilateral leg edema 01/14/2013  . Pedal edema 01/13/2013  . Chronic pain syndrome 12/30/2012  . Reactive airway disease 12/30/2012  .  Encephalopathy acute 07/25/2011  . 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    PT - End of Session Equipment Utilized During Treatment: Gait belt Activity Tolerance: Patient tolerated treatment well General Behavior During Therapy: [facility] for tasks assessed/performed   Lurena Nida, PTA/CLT 09/22/2013, 3:37 PM

## 2013-09-24 ENCOUNTER — Ambulatory Visit (HOSPITAL_COMMUNITY)
Admission: RE | Admit: 2013-09-24 | Discharge: 2013-09-24 | Disposition: A | Discharge status: Home / Self Care | Payer: BC Managed Care – PPO | Source: Ambulatory Visit | Attending: Family Medicine | Admitting: Family Medicine

## 2013-09-24 NOTE — Progress Notes (Signed)
Physical Therapy Treatment Patient Details  Name: [name] MRN: [id number] Date of Birth: 02/25/1966  Today's Date: 09/24/2013 Time: 7829-5621 PT Time Calculation (min): 41 min Charges: Therex x 18'(1434-1452) NMR x 30'(8657-8469) Gait x [phone])  Visit#: 6 of 12  Re-eval: 10/09/13  Authorization: BCBS    Subjective: Symptoms/Limitations Symptoms: Pt states that she fell earlier today trying to lock the breaks on her walker. She reports no injury from fall. Pain Assessment Currently in Pain?: Yes Pain Score: 7  Pain Location:  (Hands and feet) Pain Orientation: Right;Left   Exercise/Treatments Balance Exercises Standing Standing Eyes Opened Limitations: Focusing on equal weight distribution and finding COG Other Standing Exercises: gait with QC X 125'  Seated Other Seated Exercises: Kneel->tall knee x 10; kneel->tall kneel with BUE flexion x 10; Tall knee crawl forward/back 2 RT Other Seated Exercises: Nustep L 3 x 8' hills to improve LE strength and activity tolerance   Physical Therapy Assessment and Plan PT Assessment and Plan Clinical Impression Statement: Therapist facilitated NMR activities to improve proprioceptive control and functional independence. Pt displays improve stability in standing without UE assistance. Pt also displays improved hip strength/stability with tall kneeling/crawling activities. Pt appears to be progressing well toward all goals. PT Plan: Progress tall kneeling and balance activities.  Await OT order and schedule when received.    Problem List Patient Active Problem List   Diagnosis Date Noted  . Pernicious anemia 09/12/2013  . Hyperglycemia 09/12/2013  . Elevated transaminase level 09/12/2013  . Weakness 09/12/2013  . Ataxia 09/09/2013  . Difficulty in walking(719.7) 09/09/2013  . Weakness of both legs 09/09/2013  . Generalized weakness 02/11/2013  . Tobacco abuse 01/14/2013  . Bilateral leg edema 01/14/2013  . Pedal edema  01/13/2013  . Chronic pain syndrome 12/30/2012  . Reactive airway disease 12/30/2012  . Encephalopathy acute 07/25/2011  . 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    PT - End of Session Equipment Utilized During Treatment: Gait belt Activity Tolerance: Patient tolerated treatment well General Behavior During Therapy: [facility] for tasks assessed/performed   Seth Bake, PTA  09/24/2013, 5:07 PM

## 2013-09-25 ENCOUNTER — Ambulatory Visit (HOSPITAL_COMMUNITY)
Admission: RE | Admit: 2013-09-25 | Discharge: 2013-09-25 | Disposition: A | Discharge status: Home / Self Care | Payer: BC Managed Care – PPO | Source: Ambulatory Visit | Attending: Family Medicine | Admitting: Family Medicine

## 2013-09-25 NOTE — Progress Notes (Addendum)
Physical Therapy Treatment Patient Details  Name: [name] MRN: [id number] Date of Birth: October 20, 1965  Today's Date: 09/25/2013 Time: 7829-5621 PT Time Calculation (min): 51 min  Visit#: 7 of 12  Re-eval: 10/09/13 Authorization: BCBS  Charges:  Gait 1434-1446 (12'), NMR 1447-1512 (25'), therex 1515-1525 (10')  Subjective: Pt reports her legs are sore today from new activities added last visit.  Pt reports compliance with HEP.   Exercise/Treatments Balance Exercises Standing Other Standing Exercises: gait with QC X 200''  Seated Other Seated Exercises: Kneel->tall knee x 10; kneel->tall kneel with BUE flexion x 10; Tall knee crawl forward/back 2 RT, lateral 2RT Other Seated Exercises: Nustep L 3 x 8' hills to improve LE strength and activity tolerance  Supine Other Supine Exercises: long sitting PROM to gastrocs 30"X2 each     Physical Therapy Assessment and Plan PT Assessment:  Noted improvement in ankle stability with gait today with heel toe gait and increased step length.   Bilateral ankle ROM and strength tested today with eversion and dorsiflexion being the weakest and limited ROM.  Pt instructed with seated gastroc stretch and ankle ROM exercises to perform at home.  Pt able to demonstrate appropriately.  Pt with noted fatigue at end of session from functional activity. PT Plan: Progress tall kneeling and balance activities.  Await OT order and schedule when received.     Problem List Patient Active Problem List   Diagnosis Date Noted  . Pernicious anemia 09/12/2013  . Hyperglycemia 09/12/2013  . Elevated transaminase level 09/12/2013  . Weakness 09/12/2013  . Ataxia 09/09/2013  . Difficulty in walking(719.7) 09/09/2013  . Weakness of both legs 09/09/2013  . Generalized weakness 02/11/2013  . Tobacco abuse 01/14/2013  . Bilateral leg edema 01/14/2013  . Pedal edema 01/13/2013  . Chronic pain syndrome 12/30/2012  . Reactive airway disease 12/30/2012  .  Encephalopathy acute 07/25/2011  . 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    PT - End of Session Equipment Utilized During Treatment: Gait belt Activity Tolerance: Patient tolerated treatment well General Behavior During Therapy: [facility] for tasks assessed/performed;Agitated  GP    Lurena Nida, PTA/CLT 09/25/2013, 3:23 PM

## 2013-10-03 ENCOUNTER — Other Ambulatory Visit: Payer: Self-pay | Admitting: Family Medicine

## 2013-10-07 ENCOUNTER — Ambulatory Visit (HOSPITAL_COMMUNITY)
Admission: RE | Admit: 2013-10-07 | Discharge: 2013-10-07 | Disposition: A | Discharge status: Home / Self Care | Payer: BC Managed Care – PPO | Source: Ambulatory Visit | Attending: Family Medicine | Admitting: Family Medicine

## 2013-10-07 DIAGNOSIS — R270 Ataxia, unspecified: Secondary | ICD-10-CM

## 2013-10-07 DIAGNOSIS — R262 Difficulty in walking, not elsewhere classified: Secondary | ICD-10-CM

## 2013-10-07 DIAGNOSIS — R29898 Other symptoms and signs involving the musculoskeletal system: Secondary | ICD-10-CM

## 2013-10-07 NOTE — Progress Notes (Signed)
Physical Therapy Treatment Patient Details  Name: [name] MRN: [id number] Date of Birth: 1966/07/29  Today's Date: 10/07/2013 Time: 1191-4782 PT Time Calculation (min): 48 min  Visit#: 8 of 12  Re-eval: 10/09/13    Authorization: BCBS  Authorization Time Period:    Authorization Visit#:   of     Subjective: Symptoms/Limitations Symptoms: Pt states she has been walking with her walker more. Pain Assessment Currently in Pain?: No/denies   Exercise/Treatments  Balance Exercises Standing Standing Eyes Opened Limitations: weight shift rt/Lt; heel/toe x10 Sidestepping: 1 rep Cone Rotation: Solid surface;Limitations Cone Rotation Limitations: no cones just turning. Marching: 10 reps Sit to Stand: Standard surface Sit to Stand Limitations: x10 Lift / Chop: Limitations Left / Chop Limitations: stand with B flex; alternate Rt arm flex, Lt arm x 10@ Other Standing Exercises: tall kneeling forward and back on mat x 2; bottom to heel and back to tall kneel x 10  Other Standing Exercises: gait with QC X 200'' x 2   Seated Other Seated Exercises: Nustep L 3 x 8' hills to improve LE strength and activity tolerance   Physical Therapy Assessment and Plan PT Assessment and Plan Clinical Impression Statement: PT needs reminding to keep shoulders squared when completing exercises.  All exercises completed with therapist facilitation.  Pt needed multiple short rest breaks today with noted fatigue after therapy. PT Plan: Pt to be reevaluated next treatment.    Goals  progressing.  Problem List Patient Active Problem List   Diagnosis Date Noted  . Pernicious anemia 09/12/2013  . Hyperglycemia 09/12/2013  . Elevated transaminase level 09/12/2013  . Weakness 09/12/2013  . Ataxia 09/09/2013  . Difficulty in walking(719.7) 09/09/2013  . Weakness of both legs 09/09/2013  . Generalized weakness 02/11/2013  . Tobacco abuse 01/14/2013  . Bilateral leg edema 01/14/2013  . Pedal  edema 01/13/2013  . Chronic pain syndrome 12/30/2012  . Reactive airway disease 12/30/2012  . Encephalopathy acute 07/25/2011  . 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       GP    RUSSELL,CINDY 10/07/2013, 4:23 PM

## 2013-10-09 ENCOUNTER — Other Ambulatory Visit: Payer: Self-pay | Admitting: Family Medicine

## 2013-10-11 NOTE — Telephone Encounter (Signed)
I do not recommend Seroquel for this patient because she is already on Celexa. The tube can interact and cause serotonin excess which is dangerous. Therefore I would recommend canceling Seroquel. This will need to be communicated to the pharmacy and either a note sent to the patient regarding this or a phone call to her.

## 2013-10-13 ENCOUNTER — Ambulatory Visit (HOSPITAL_COMMUNITY)
Admission: RE | Admit: 2013-10-13 | Discharge: 2013-10-13 | Disposition: A | Discharge status: Home / Self Care | Payer: BC Managed Care – PPO | Source: Ambulatory Visit | Attending: Family Medicine | Admitting: Family Medicine

## 2013-10-13 DIAGNOSIS — R270 Ataxia, unspecified: Secondary | ICD-10-CM

## 2013-10-13 DIAGNOSIS — J4489 Other specified chronic obstructive pulmonary disease: Secondary | ICD-10-CM | POA: Insufficient documentation

## 2013-10-13 DIAGNOSIS — Z9181 History of falling: Secondary | ICD-10-CM | POA: Insufficient documentation

## 2013-10-13 DIAGNOSIS — R262 Difficulty in walking, not elsewhere classified: Secondary | ICD-10-CM

## 2013-10-13 DIAGNOSIS — R279 Unspecified lack of coordination: Secondary | ICD-10-CM | POA: Insufficient documentation

## 2013-10-13 DIAGNOSIS — IMO0001 Reserved for inherently not codable concepts without codable children: Secondary | ICD-10-CM | POA: Insufficient documentation

## 2013-10-13 DIAGNOSIS — J449 Chronic obstructive pulmonary disease, unspecified: Secondary | ICD-10-CM | POA: Insufficient documentation

## 2013-10-13 DIAGNOSIS — R29898 Other symptoms and signs involving the musculoskeletal system: Secondary | ICD-10-CM

## 2013-10-13 DIAGNOSIS — R269 Unspecified abnormalities of gait and mobility: Secondary | ICD-10-CM | POA: Insufficient documentation

## 2013-10-13 NOTE — Evaluation (Signed)
Physical Therapy Evaluation  Patient Details  Name: [name] MRN: [id number] Date of Birth: [birth year]  Today's Date: 10/13/2013 Time: 1350-1430 PT Time Calculation (min): 40 min Charge:  MMTest 1350-1405; physical performance 1405-1420; self care 1420-1430             Visit#: 9 of 17  Re-eval: 11/12/13 Assessment Diagnosis:  (ataxia) Next MD Visit: 09/12/2013 Prior Therapy: HH  Authorization: BCBS    Authorization Time Period:    Authorization Visit#:   of     Past Medical History:  Past Medical History  Diagnosis Date  . Anxiety   . Depression   . COPD (chronic obstructive pulmonary disease)   . MS (multiple sclerosis)   . Fibromyalgia   . Impaired fasting glucose   . 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 Eating disorder   . Chronic low back pain   . PONV (postoperative nausea and vomiting)   . Chronic bronchitis     "yearly" (02/11/2013)  . Borderline diabetes   . GERD (gastroesophageal reflux disease)   . Migraine     "I use to" (02/11/2013)  . Arthritis     "hands & feet" (02/11/2013)  . Peripheral neuropathy     [name] 02/11/2013  . B12 deficiency anemia     [name] 02/11/2013  . Chronic pain syndrome     [name] 02/11/2013  . UTI (lower urinary tract infection) 02/11/2013    [name] 02/11/2013  . Chronic edema     BLE/notes 02/11/2013   Past Surgical History:  Past Surgical History  Procedure Laterality Date  . Abdominal hysterectomy  ~ 1987; ~ 2004    "[place]; [place]" (02/11/2013)  . Cesarean section  0539; 1984; 1986  . Anterior cervical decomp/discectomy fusion  2009; 2011  . Tonsillectomy  1990's?  . Cataract extraction w/phaco  06/20/2011    Procedure: CATARACT EXTRACTION PHACO AND INTRAOCULAR LENS PLACEMENT (IOC);  Surgeon: [name] T. Gershon Crane;  Location: [facility];  Service: Ophthalmology;  Laterality: Left;  CDE 1.81  . Cataract extraction w/phaco  07/04/2011    Procedure: CATARACT EXTRACTION PHACO AND INTRAOCULAR LENS PLACEMENT  (IOC);  Surgeon: [name] T. Gershon Crane;  Location: [facility];  Service: Ophthalmology;  Laterality: Right;  CDE: 1.76  . Yag laser application Right 7/67/3419    Procedure: YAG LASER APPLICATION;  Surgeon: [name] T. Gershon Crane, MD;  Location: [facility];  Service: Ophthalmology;  Laterality: Right;  . Yag laser application Left 3/79/0240    Procedure: YAG LASER APPLICATION;  Surgeon: [name] T. Gershon Crane, MD;  Location: [facility];  Service: Ophthalmology;  Laterality: Left;  . Appendectomy      Subjective Symptoms/Limitations Symptoms: Pt stated her lower back and Bil feet are bothering her today. Pertinent History: 2 cervical surgeries last surgery 1998 How long can you sit comfortably?: sitting has not been a problem How long can you stand comfortably?: Pt able to come sit to stand using UE on one attempt and stand x 4 minutes. On initial evaluation pt was unable to come sit to stand after three attempts. How long can you walk comfortably?: Pt is now walking in the house without an assistive device although therapist does not recommend this at this time. Pt was using a walker and needing minimum assist. Pain Assessment Currently in Pain?: Yes Pain Score: 6  Pain Location: Foot Pain Orientation: Right;Left  Precautions/Restrictions  Precautions Precautions: Fall   Prior Functioning  Prior Function Vocation: On disability (cervical )  Cognition/Observation Observation/Other Assessments Other  Assessments: decreased proprioception with 1/5 errors was 4/5 errors.  Sensation/Coordination/Flexibility/Functional Tests Coordination Gross Motor Movements are Fluid and Coordinated: No Heel Shin Test: - Functional Tests Functional Tests: two minute ambulation = 250 ft. was 120 ft.  Assessment RLE AROM (degrees) Right Ankle Dorsiflexion: -30 (was -15 affected by tone) Right Ankle Plantar Flexion: 50 Right Ankle Inversion: 20 Right Ankle Eversion: 10 RLE Strength Right Hip Flexion: 5/5 Right Hip Extension:   (5-/5 was 3+/5) Right Hip ABduction: 5/5 Right Knee Flexion: 5/5 Right Knee Extension: 5/5 Right Ankle Dorsiflexion: 4/5 (in available range) Right Ankle Plantar Flexion: 3+/5 Right Ankle Inversion: 3+/5 Right Ankle Eversion: 3+/5 LLE AROM (degrees) Left Ankle Dorsiflexion: -30 (was -20 affected by tone.) Left Ankle Plantar Flexion: 50 Left Ankle Inversion: 20 Left Ankle Eversion: 10 LLE Strength Left Hip Flexion: 5/5 Left Hip Extension:  (5-/5 was 3-/5) Left Hip ABduction: 5/5 Left Knee Flexion: 5/5 (was 5-/5) Left Knee Extension: 4/5 Left Ankle Dorsiflexion: 4/5 Left Ankle Plantar Flexion: 3+/5 Left Ankle Inversion: 3+/5 Left Ankle Eversion: 3+/5  Exercise/Treatments Mobility/Balance  Berg Balance Test Sit to Stand: Able to stand  independently using hands Standing Unsupported: Able to stand safely 2 minutes Sitting with Back Unsupported but Feet Supported on Floor or Stool: Able to sit safely and securely 2 minutes Stand to Sit: Controls descent by using hands Transfers: Able to transfer safely, definite need of hands Standing Unsupported with Eyes Closed: Able to stand 10 seconds with supervision Standing Ubsupported with Feet Together: Able to place feet together independently and stand for 1 minute with supervision From Standing, Reach Forward with Outstretched Arm: Can reach forward >12 cm safely (5") From Standing Position, Pick up Object from Floor: Able to pick up shoe, needs supervision From Standing Position, Turn to Look Behind Over each Shoulder: Turn sideways only but maintains balance Turn 360 Degrees: Needs close supervision or verbal cueing Standing Unsupported, Alternately Place Feet on Step/Stool: Able to complete >2 steps/needs minimal assist Standing Unsupported, One Foot in Front: Able to take small step independently and hold 30 seconds Standing on One Leg: Tries to lift leg/unable to hold 3 seconds but remains standing independently Total Score: 36     Physical Therapy Assessment and Plan PT Assessment and Plan Clinical Impression Statement: Pt seen for reassessment pt no longer in need of strengthening but continues to need balance activity.  Pt needs skilled therapy to improve balance to decrease risk of falling. PT Frequency: Min 3X/week PT Duration: 4 weeks (for a total of 8 weeks,therapist recommending 4 more weeks) PT Treatment/Interventions: Functional mobility training;Balance training;Patient/family education;Other (comment) PT Plan: Pt to work on high balance activities.  Pt will benefit from tall kneeling and opposite arm/leg work   Stress the importance of PROM to increase dorsiflexion. Goals Home Exercise Program PT Goal: Perform Home Exercise Program - Progress: Met PT Short Term Goals Time to Complete Short Term Goals: 2 weeks PT Short Term Goal 1: Pt to be able to come from sit to stand on second attempt PT Short Term Goal 2: Pt to be able to stand I x 5 seconds with no UE assist PT Short Term Goal 2 - Progress: Met PT Short Term Goal 3: Pt to be able to walk 200 feet in an two minute period of time PT Short Term Goal 3 - Progress: Met PT Long Term Goals Time to Complete Long Term Goals: 4 weeks PT Long Term Goal 1: Pt proprioception improved to correct 3/5 times PT Long  Term Goal 2: Pt to be able to come sit to stand on one attempt Long Term Goal 3 Progress: Met Long Term Goal 4: Pt to be able to walk for 15 minute wihout stopping Long Term Goal 4 Progress: Met PT Long Term Goal 5: New Goal as of 10/13/2013-  Pt to improve Berg balance score by 8 to reduce risk of falling and allow pt to ambulate safely with a cane.  Problem List Patient Active Problem List   Diagnosis Date Noted  . Pernicious anemia 09/12/2013  . Hyperglycemia 09/12/2013  . Elevated transaminase level 09/12/2013  . Weakness 09/12/2013  . Ataxia 09/09/2013  . Difficulty in walking(719.7) 09/09/2013  . Weakness of both legs 09/09/2013  .  Generalized weakness 02/11/2013  . Tobacco abuse 01/14/2013  . Bilateral leg edema 01/14/2013  . Pedal edema 01/13/2013  . Chronic pain syndrome 12/30/2012  . Reactive airway disease 12/30/2012  . Encephalopathy acute 07/25/2011  . 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    PT - End of Session Equipment Utilized During Treatment: Gait belt Activity Tolerance: Patient tolerated treatment well General Behavior During Therapy: [facility] for tasks assessed/performed;Agitated  GP    Diamon Reddinger,CINDY 10/13/2013, 5:06 PM  Physician Documentation Your signature is required to indicate approval of the treatment plan as stated above.  Please sign and either send electronically or make a copy of this report for your files and return this physician signed original.   Please mark one 1.__approve of plan  2. ___approve of plan with the following conditions.   ______________________________                                                          _____________________ Physician Signature                                                                                                             Date

## 2013-10-15 ENCOUNTER — Ambulatory Visit (HOSPITAL_COMMUNITY)
Admission: RE | Admit: 2013-10-15 | Discharge: 2013-10-15 | Disposition: A | Discharge status: Home / Self Care | Payer: BC Managed Care – PPO | Source: Ambulatory Visit | Attending: Family Medicine | Admitting: Family Medicine

## 2013-10-15 DIAGNOSIS — R262 Difficulty in walking, not elsewhere classified: Secondary | ICD-10-CM

## 2013-10-15 DIAGNOSIS — R29898 Other symptoms and signs involving the musculoskeletal system: Secondary | ICD-10-CM

## 2013-10-15 DIAGNOSIS — R270 Ataxia, unspecified: Secondary | ICD-10-CM

## 2013-10-15 NOTE — Progress Notes (Signed)
Physical Therapy Treatment Patient Details  Name: [name] MRN: [id number] Date of Birth: 1966/05/01  Today's Date: 10/15/2013 Time: 7106-2694 PT Time Calculation (min): 59 min Charge: neurore-ec 8546-2703; there ex 5009-3818 Visit#: 10 of 17  Re-eval: 11/12/13    Authorization: BCBS  Authorization Time Period:    Authorization Visit#:   of     Subjective: Symptoms/Limitations Symptoms: Pt stated her lower back is bothering her today. Pain Assessment Pain Score: 4  Pain Location: Back Pain Orientation: Lower   Exercise/Treatments     Balance Exercises Standing Standing Eyes Opened Limitations: weight shift rt/Lt; heel/toe x10 Tandem Stance: Eyes open;2 reps;30 secs Wall Bumps: Shoulder;Hips;10 reps Sidestepping: 1 rep Cone Rotation: Solid surface;Limitations Cone Rotation Limitations: no cones just turning. Marching: 10 reps Sit to Stand: Standard surface Sit to Stand Limitations: x10 Lift / Chop: Limitations Left / Chop Limitations: stand with B flex; alternate Rt arm flex, Lt arm x 10@ Other Standing Exercises: tall kneeling forward and back on mat x 2; bottom to heel and back to tall kneel x 10  Other Standing Exercises: gait with QC X 200'' x 2  Yoga Poses    Seated Other Seated Exercises: Nustep L 3 x 8' hills to improve LE strength and activity tolerance  Supine       Physical Therapy Assessment and Plan PT Assessment and Plan Clinical Impression Statement: All exercises were facilitated by therapist for proper weight distribution and posture. PT Plan: Pt to work on high balance activities.  Pt will benefit from opposite arm/leg work    Goals    Problem List Patient Active Problem List   Diagnosis Date Noted  . Pernicious anemia 09/12/2013  . Hyperglycemia 09/12/2013  . Elevated transaminase level 09/12/2013  . Weakness 09/12/2013  . Ataxia 09/09/2013  . Difficulty in walking(719.7) 09/09/2013  . Weakness of both legs 09/09/2013  .  Generalized weakness 02/11/2013  . Tobacco abuse 01/14/2013  . Bilateral leg edema 01/14/2013  . Pedal edema 01/13/2013  . Chronic pain syndrome 12/30/2012  . Reactive airway disease 12/30/2012  . Encephalopathy acute 07/25/2011  . 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

## 2013-10-16 DIAGNOSIS — G609 Hereditary and idiopathic neuropathy, unspecified: Secondary | ICD-10-CM | POA: Diagnosis not present

## 2013-10-17 ENCOUNTER — Ambulatory Visit (HOSPITAL_COMMUNITY)
Admission: RE | Admit: 2013-10-17 | Discharge: 2013-10-17 | Disposition: A | Discharge status: Home / Self Care | Payer: BC Managed Care – PPO | Source: Ambulatory Visit | Attending: Family Medicine | Admitting: Family Medicine

## 2013-10-17 DIAGNOSIS — R29898 Other symptoms and signs involving the musculoskeletal system: Secondary | ICD-10-CM

## 2013-10-17 DIAGNOSIS — R270 Ataxia, unspecified: Secondary | ICD-10-CM

## 2013-10-17 DIAGNOSIS — R262 Difficulty in walking, not elsewhere classified: Secondary | ICD-10-CM

## 2013-10-17 NOTE — Progress Notes (Signed)
Physical Therapy Treatment Patient Details  Name: MARIECLAIRE BETTENHAUSEN MRN: [id number] Date of Birth: 1966/03/06  Today's Date: 10/17/2013 Time: 1355-1435 PT Time Calculation (min): 40 min There ex 1355-1420; there activity 1420-1435. Visit#: 11 of 17  Re-eval: 11/12/13  Authorization: BCBS  Subjective: Symptoms/Limitations Symptoms: Pt states the bottom of her feet are hurting her; states she was on her feet a long time yesterday Pain Assessment Pain Score: 7  Pain Location: Foot Pain Orientation: Right;Left  Precautions/Restrictions   falls  Exercise/Treatments Mobility/Balance        (tall kneeling perimeter of both mats x 2 forward and backwards)   (all 4's opposite arm/leg x 10; donkey kicks and fire hydrant)  (sidelying 3#, sidelying circles forward and backward x 10 B)   (tall kneel lower 1/3 hold, lower 2/3, hold down up 1/3 hold )  Seated   Other Seated Exercises: Nustep L 4 x 8' hills to improve LE strength and activity tolerance  Prone hip extension x 10 B      Physical Therapy Assessment and Plan PT Assessment and Plan Clinical Impression Statement: Pt states she is unable to complete standing exercises secondary to her feet hurting therefore therapist worked on balance, strength and proprioception exercises on the mat. PT Plan: start weightbearing activities back up next week.    Goals  progressing  Problem List Patient Active Problem List   Diagnosis Date Noted  . Pernicious anemia 09/12/2013  . Hyperglycemia 09/12/2013  . Elevated transaminase level 09/12/2013  . Weakness 09/12/2013  . Ataxia 09/09/2013  . Difficulty in walking(719.7) 09/09/2013  . Weakness of both legs 09/09/2013  . Generalized weakness 02/11/2013  . Tobacco abuse 01/14/2013  . Bilateral leg edema 01/14/2013  . Pedal edema 01/13/2013  . Chronic pain syndrome 12/30/2012  . Reactive airway disease 12/30/2012  . Encephalopathy acute 07/25/2011  . 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

## 2013-10-21 ENCOUNTER — Ambulatory Visit (HOSPITAL_COMMUNITY)
Admission: RE | Admit: 2013-10-21 | Discharge: 2013-10-21 | Disposition: A | Discharge status: Home / Self Care | Payer: BC Managed Care – PPO | Source: Ambulatory Visit | Attending: Family Medicine | Admitting: Family Medicine

## 2013-10-21 NOTE — Progress Notes (Signed)
Physical Therapy Treatment Patient Details  Name: [name] MRN: [id number] Date of Birth: [birth year]  Today's Date: 10/21/2013 Time: 0160-1093 PT Time Calculation (min): 45 min Charges: Gait training x 23'(5573-2202) NMR x 54'(2706-2376) Therex x 10'(1505-1515)  Visit#: 12 of 17  Re-eval: 11/12/13  Authorization: BCBS    Subjective: Symptoms/Limitations Symptoms: Pt states that her feet are feeling better. Pain Assessment Currently in Pain?: Yes Pain Score: 6  Pain Location: Foot Pain Orientation: Right;Left   Exercise/Treatments Balance Exercises Standing Tandem Stance: Eyes open;2 reps;30 secs Retro Gait: 1 rep Sidestepping: 2 reps Marching: 10 reps Sit to Stand: Standard surface Sit to Stand Limitations: x10 Left / Chop Limitations: weight shift x10 R/L Other Standing Exercises: Gait training around dept without AD; min assist  Seated Other Seated Exercises: Nustep L3 x 10' hills #3 to improve LE strength and activity tolerance   Physical Therapy Assessment and Plan PT Assessment and Plan Clinical Impression Statement: Resumed standing activities this session. Pt displays improved stability with gait training without AD. Pt also displays improved activity tolerance. Pt requires vc's with gait training to decrease speed to improve control. PT Plan: Continue to progress balance, gait mechanics and functional strength per PT POC.     Problem List Patient Active Problem List   Diagnosis Date Noted  . Pernicious anemia 09/12/2013  . Hyperglycemia 09/12/2013  . Elevated transaminase level 09/12/2013  . Weakness 09/12/2013  . Ataxia 09/09/2013  . Difficulty in walking(719.7) 09/09/2013  . Weakness of both legs 09/09/2013  . Generalized weakness 02/11/2013  . Tobacco abuse 01/14/2013  . Bilateral leg edema 01/14/2013  . Pedal edema 01/13/2013  . Chronic pain syndrome 12/30/2012  . Reactive airway disease 12/30/2012  . Encephalopathy acute 07/25/2011  .  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

## 2013-10-22 ENCOUNTER — Ambulatory Visit (HOSPITAL_COMMUNITY)
Admission: RE | Admit: 2013-10-22 | Discharge: 2013-10-22 | Disposition: A | Discharge status: Home / Self Care | Payer: BC Managed Care – PPO | Source: Ambulatory Visit | Attending: Family Medicine | Admitting: Family Medicine

## 2013-10-22 NOTE — Progress Notes (Signed)
Physical Therapy Treatment Patient Details  Name: [name] MRN: [id number] Date of Birth: [birth year]  Today's Date: 10/22/2013 Time: 1350-1430 PT Time Calculation (min): 40 min Charges: Gait x 06'(2376-2831) Therex x 51'(7616-0737)  Visit#: 14 of 17  Re-eval: 11/12/13  Authorization: BCBS    Subjective: Symptoms/Limitations Symptoms: Pt states that her hands are hurting. She is interested in recieving occupational therapy. Pain Assessment Currently in Pain?: Yes Pain Score: 7  Pain Location: Hand Pain Orientation: Right;Left   Exercise/Treatments Standing Retro Gait: 2 reps Sidestepping: 2 reps Other Standing Exercises: Gait trainign around dept without AD; min assist  Seated Other Seated Exercises: Ankle AROM all directions; DF with green tband x 10 bilateral   Physical Therapy Assessment and Plan PT Assessment and Plan Clinical Impression Statement: Therapist facilitated activities to improve proprioceptive control and balance. Pt displays improve activity tolerance with gait training. Began ankle strengthening to improve balance and stability. Recommended to pt bring extra pair of tennis shoes so that exercises can be completed without braces. PT Plan: Continue to progress balance, gait mechanics and functional strength per PT POC. Begin standing activities without AFOs next session.(Pt is to bring extra tennis shoes).    Problem List Patient Active Problem List   Diagnosis Date Noted  . Pernicious anemia 09/12/2013  . Hyperglycemia 09/12/2013  . Elevated transaminase level 09/12/2013  . Weakness 09/12/2013  . Ataxia 09/09/2013  . Difficulty in walking(719.7) 09/09/2013  . Weakness of both legs 09/09/2013  . Generalized weakness 02/11/2013  . Tobacco abuse 01/14/2013  . Bilateral leg edema 01/14/2013  . Pedal edema 01/13/2013  . Chronic pain syndrome 12/30/2012  . Reactive airway disease 12/30/2012  . Encephalopathy acute 07/25/2011  . Myoclonic jerking  07/25/2011    PT - End of Session Activity Tolerance: Patient tolerated treatment well General Behavior During Therapy: [facility] for tasks assessed/performed  Rachelle Hora, PTA 10/22/2013, 5:53 PM

## 2013-10-23 ENCOUNTER — Ambulatory Visit (HOSPITAL_COMMUNITY)
Admission: RE | Admit: 2013-10-23 | Discharge: 2013-10-23 | Disposition: A | Discharge status: Home / Self Care | Payer: BC Managed Care – PPO | Source: Ambulatory Visit | Attending: Family Medicine | Admitting: Family Medicine

## 2013-10-23 NOTE — Progress Notes (Signed)
Physical Therapy Treatment Patient Details  Name: [name] MRN: [id number] Date of Birth: [birth year]  Today's Date: 10/23/2013 Time: 1518-1600 PT Time Calculation (min): 42 min Charges: Gait x 67'(5916-3846) NMR x 65'(9935-7017) Therex x 79'(3903-0092)  Visit#: 15 of 17  Re-eval: 11/12/13  Authorization: BCBS   Subjective: Symptoms/Limitations Symptoms: Pt states would like walk without AFOs. Pain Assessment Currently in Pain?: No/denies  Exercise/Treatments Balance Exercises Standing Tandem Stance: 2 reps;30 secs;Eyes open SLS: 3 reps;20 secs;Intermittent HHA;Eyes open;Solid surface Retro Gait: 2 reps Sidestepping: 2 reps Heel Raises: 10 reps Other Standing Exercises: Gait trainign around dept with RW, without AFOs with CGA Other Standing Exercises: Functional squats x 10  Seated Other Seated Exercises: Nustep L3 x 10' hills #3 to improve LE strength and activity tolerance   Physical Therapy Assessment and Plan PT Assessment and Plan Clinical Impression Statement: Pt ambulates into therapy with rolling walker. Therapy session completed without AFOs. Pt displays improved gait mechanics without AFOs. Pt is less easily fatigued with gait with rolling walker. Encouraged pt to ambulate more frequently at home without AFOs when she has assistance. OT order sent to referring MD to address hand weakness and pain. Pt appears to be progressing very well over all.  PT Plan: Continue to progress balance, gait mechanics and functional strength per PT POC. Therapy is to be completed with our AFOs. F/U on order for OT.    Problem List Patient Active Problem List   Diagnosis Date Noted  . Pernicious anemia 09/12/2013  . Hyperglycemia 09/12/2013  . Elevated transaminase level 09/12/2013  . Weakness 09/12/2013  . Ataxia 09/09/2013  . Difficulty in walking(719.7) 09/09/2013  . Weakness of both legs 09/09/2013  . Generalized weakness 02/11/2013  . Tobacco abuse 01/14/2013  .  Bilateral leg edema 01/14/2013  . Pedal edema 01/13/2013  . Chronic pain syndrome 12/30/2012  . Reactive airway disease 12/30/2012  . Encephalopathy acute 07/25/2011  . 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

## 2013-10-27 ENCOUNTER — Ambulatory Visit (HOSPITAL_COMMUNITY)
Admission: RE | Admit: 2013-10-27 | Discharge: 2013-10-27 | Disposition: A | Discharge status: Home / Self Care | Payer: BC Managed Care – PPO | Source: Ambulatory Visit | Attending: Family Medicine | Admitting: Family Medicine

## 2013-10-27 NOTE — Progress Notes (Addendum)
Physical Therapy Treatment Patient Details  Name: [name] MRN: [id number] Date of Birth: [birth year]  Today's Date: 10/27/2013 Time: 1345-1435 PT Time Calculation (min): 50 min Charge:  There activity 1345-1410; there ex 1410-1435 Visit#: 16 of 17  Re-eval: 11/12/13    Authorization: BCBS  Subjective: Symptoms/Limitations Symptoms: Pt states she can tell she is walking better every week. Pain Assessment Currently in Pain?: No/denies  Precautions/Restrictions     Exercise/Treatments     Balance Exercises Standing Tandem Gait: 2 reps Retro Gait: 2 reps Marching: 10 reps Sit to Stand:  (squat to pick ball off 6" step then to toes x 10) Other Standing Exercises: lunge forward x 10 Other Standing Exercises: braiding 1 RT; foot to 12 then 3 then back on Rt; 12 then 9  then back on Lt x 10      Seated Other Seated Exercises: sit to stand Rt LE in back x 10: Lt in back x 10  Other Seated Exercises: Nustep L4 x 10:00  Supine Other Supine Exercises: long sit ankle t-band ex x 10     Physical Therapy Assessment and Plan PT Assessment and Plan Clinical Impression Statement: Pt walks into therapy with four wheeled walker.  Walks in department without an assistive device.  Pt worked on Probation officer with therapist facilitation to keep hips and shoulders aligned as well as keeping anklesfrom rolling. PT Plan: Pt willl be reassessed next treatment.     Goals Home Exercise Program Pt/caregiver will Perform Home Exercise Program: For improved balance PT Goal: Perform Home Exercise Program - Progress: Met PT Short Term Goals PT Short Term Goal 1: Pt to be able to come from sit to stand on second attempt PT Short Term Goal 1 - Progress: Met PT Short Term Goal 2: Pt to be able to stand I x 5 seconds with no UE assist PT Short Term Goal 2 - Progress: Met PT Short Term Goal 3: Pt to be able to walk 200 feet in an two minute period of time PT Short Term Goal 3 - Progress:  Met PT Long Term Goals PT Long Term Goal 1: Pt proprioception improved to correct 3/5 times PT Long Term Goal 1 - Progress: Met PT Long Term Goal 2: Pt to be able to come sit to stand on one attempt PT Long Term Goal 2 - Progress: Met Long Term Goal 3: Pt to be able to ambulate 250 ft in two minutes Long Term Goal 3 Progress: Met Long Term Goal 4: Pt to be able to walk for 15 minute wihout stopping Long Term Goal 4 Progress: Met PT Long Term Goal 5: New Goal as of 10/13/2013-  Pt to improve Berg balance score by 8 to reduce risk of falling and allow pt to ambulate safely with a cane. Long Term Goal 5 Progress: Progressing toward goal  Problem List Patient Active Problem List   Diagnosis Date Noted  . Pernicious anemia 09/12/2013  . Hyperglycemia 09/12/2013  . Elevated transaminase level 09/12/2013  . Weakness 09/12/2013  . Ataxia 09/09/2013  . Difficulty in walking(719.7) 09/09/2013  . Weakness of both legs 09/09/2013  . Generalized weakness 02/11/2013  . Tobacco abuse 01/14/2013  . Bilateral leg edema 01/14/2013  . Pedal edema 01/13/2013  . Chronic pain syndrome 12/30/2012  . Reactive airway disease 12/30/2012  . Encephalopathy acute 07/25/2011  . 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

## 2013-10-29 ENCOUNTER — Ambulatory Visit (HOSPITAL_COMMUNITY)
Admission: RE | Admit: 2013-10-29 | Discharge: 2013-10-29 | Disposition: A | Discharge status: Home / Self Care | Payer: BC Managed Care – PPO | Source: Ambulatory Visit | Attending: Family Medicine | Admitting: Family Medicine

## 2013-10-29 NOTE — Progress Notes (Signed)
Physical Therapy Treatment Patient Details  Name: [name] MRN: [id number] Date of Birth: 1966-01-10  Today's Date: 10/29/2013 Time: 1350-1430 PT Time Calculation (min): 40 min Charges: NMR x 35'  Visit#: 16 of 21  Re-eval: 11/12/13  Authorization: BCBS   Subjective: Symptoms/Limitations Symptoms: Pt reports continued HEP compliance. Pain Assessment Currently in Pain?: No/denies   Exercise/Treatments Standing Tandem Gait: 2 reps Marching: 10 reps Rocker board x 2' A/P R/L;  Slant board stretch 2x30" Braiding 1 RT  Foot to 12 then 3 then back on Rt; 12 then 9  then back on Lt x 10   Physical Therapy Assessment and Plan PT Assessment and Plan Clinical Impression Statement: Pt ambulates into therapy without AFOs and with rolling walker. Therapists facilitated activities to improve proprioceptive control and stability with ambulation. Pt requires moderate assistance to recover from multiple LOB during balance activities. Overall stability appears to be improving. Reassessment not need until 23rd visit. PT Plan: Continue to progress proprioceptive control and stability per PT POC.     Problem List Patient Active Problem List   Diagnosis Date Noted  . Pernicious anemia 09/12/2013  . Hyperglycemia 09/12/2013  . Elevated transaminase level 09/12/2013  . Weakness 09/12/2013  . Ataxia 09/09/2013  . Difficulty in walking(719.7) 09/09/2013  . Weakness of both legs 09/09/2013  . Generalized weakness 02/11/2013  . Tobacco abuse 01/14/2013  . Bilateral leg edema 01/14/2013  . Pedal edema 01/13/2013  . Chronic pain syndrome 12/30/2012  . Reactive airway disease 12/30/2012  . Encephalopathy acute 07/25/2011  . 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

## 2013-10-30 ENCOUNTER — Ambulatory Visit (HOSPITAL_COMMUNITY)
Admission: RE | Admit: 2013-10-30 | Discharge: 2013-10-30 | Disposition: A | Discharge status: Home / Self Care | Payer: BC Managed Care – PPO | Source: Ambulatory Visit | Attending: Family Medicine | Admitting: Family Medicine

## 2013-10-30 NOTE — Progress Notes (Addendum)
Physical Therapy Treatment Patient Details  Name: [name] MRN: [id number] Date of Birth: [birth year]  Today's Date: 10/30/2013 Time: 6333-5456 PT Time Calculation (min): 42 min Charges: Therex x 25'(6389-3734) NMR x [id number])  Visit#: 17 of 21  Re-eval: 11/12/13  Authorization: BCBS   Subjective: Symptoms/Limitations Symptoms: Pt states that her feet are sore today. Pain Assessment Currently in Pain?: Yes Pain Score: 7  Pain Location: Foot Pain Orientation: Right;Left   Exercise/Treatments Balance Exercises Standing Standing Eyes Opened: Narrow base of support (BOS);Head turns (2x5 up/down; 2x5 R/L) Marching: 10 reps Heel Raises: 10 reps Rocker board x 2' A/P R/L Slant board stretch 2x30"   Seated Marble pick up x 10 BLE Nustep L4 x 10:00 To improve LE strength and activity tolerance  Physical Therapy Assessment and Plan PT Assessment and Plan Clinical Impression Statement: Therapist facilitated activities to improve proprioceptive control and stability with ambulation. Began marble pick up to improve intrinsic foot strength. Pt continues to display improved stability with gait. Pt appears to be progressing well toward all goals. PT Plan: Continue to progress proprioceptive control and stability per PT POC. Begin seated BAPS next session on L1.   [id number] CJ [id number] CL  Problem List Patient Active Problem List   Diagnosis Date Noted  . Pernicious anemia 09/12/2013  . Hyperglycemia 09/12/2013  . Elevated transaminase level 09/12/2013  . Weakness 09/12/2013  . Ataxia 09/09/2013  . Difficulty in walking(719.7) 09/09/2013  . Weakness of both legs 09/09/2013  . Generalized weakness 02/11/2013  . Tobacco abuse 01/14/2013  . Bilateral leg edema 01/14/2013  . Pedal edema 01/13/2013  . Chronic pain syndrome 12/30/2012  . Reactive airway disease 12/30/2012  . Encephalopathy acute 07/25/2011  . 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

## 2013-10-31 ENCOUNTER — Other Ambulatory Visit: Payer: Self-pay | Admitting: Family Medicine

## 2013-10-31 NOTE — Telephone Encounter (Signed)
Ok times 4 

## 2013-11-03 ENCOUNTER — Ambulatory Visit (HOSPITAL_COMMUNITY)
Admission: RE | Admit: 2013-11-03 | Discharge: 2013-11-03 | Disposition: A | Discharge status: Home / Self Care | Payer: BC Managed Care – PPO | Source: Ambulatory Visit | Attending: Family Medicine | Admitting: Family Medicine

## 2013-11-03 DIAGNOSIS — R279 Unspecified lack of coordination: Secondary | ICD-10-CM | POA: Insufficient documentation

## 2013-11-03 NOTE — Progress Notes (Signed)
Physical Therapy Treatment Patient Details  Name: [name] MRN: [id number] Date of Birth: 1966-05-21  Today's Date: 11/03/2013 Time: 7001-7494 PT Time Calculation (min): 35 min Charges: NMR x 32'  Visit#: 18 of 21  Re-eval: 11/12/13  Authorization: BCBS   Subjective: Symptoms/Limitations Symptoms: Pt reports continued HEP compliance. Pain Assessment Currently in Pain?: Yes Pain Score: 6  Pain Location:  (bilateral hands and feet)   Exercise/Treatments Balance Exercises Standing Standing Eyes Opened: Narrow base of support (BOS);Head turns Standing Eyes Opened Limitations: Narrow base of support (BOS);Head turns (2x5 up/down; 2x5 R/L) Tandem Gait: 2 reps Marching: 10 reps Heel Raises: 10 reps Other Standing Exercises: Rocker board x 2' A/P R/L; Slant board stretch 2x30"  Seated Other Seated Exercises: BAPS L1 all directions x 10   Physical Therapy Assessment and Plan PT Assessment and Plan Clinical Impression Statement: Pt continues to progress well. Progressed to seated BAPS activity to improve ankle strategy and stability with ambulation. Pt displays improved activity tolerance requires less frequent rest breaks.  PT Plan: Continue to progress proprioceptive control and stability per PT POC.     Problem List Patient Active Problem List   Diagnosis Date Noted  . Pernicious anemia 09/12/2013  . Hyperglycemia 09/12/2013  . Elevated transaminase level 09/12/2013  . Weakness 09/12/2013  . Ataxia 09/09/2013  . Difficulty in walking(719.7) 09/09/2013  . Weakness of both legs 09/09/2013  . Generalized weakness 02/11/2013  . Tobacco abuse 01/14/2013  . Bilateral leg edema 01/14/2013  . Pedal edema 01/13/2013  . Chronic pain syndrome 12/30/2012  . Reactive airway disease 12/30/2012  . Encephalopathy acute 07/25/2011  . 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

## 2013-11-05 ENCOUNTER — Ambulatory Visit (HOSPITAL_COMMUNITY): Payer: BC Managed Care – PPO | Admitting: *Deleted

## 2013-11-05 NOTE — Evaluation (Signed)
Occupational Therapy Evaluation  Patient Details  Name: [name] MRN: [id number] Date of Birth: Apr 27, 1966  Today's Date: 11/03/2013 Time: 8315-1761 OT Time Calculation (min): 41 min Evaluation 1355-1425 (30') TherExercises 1425-1436 (11')   Visit#: 1 of 16  Re-eval: 12/01/13     Authorization: Medicare   Authorization Time Period: before 10th visit  Authorization Visit#: 1 of 10   Past Medical History:  Past Medical History  Diagnosis Date  . Anxiety   . Depression   . COPD (chronic obstructive pulmonary disease)   . MS (multiple sclerosis)   . Fibromyalgia   . Impaired fasting glucose   . 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 Eating disorder   . Chronic low back pain   . PONV (postoperative nausea and vomiting)   . Chronic bronchitis     "yearly" (02/11/2013)  . Borderline diabetes   . GERD (gastroesophageal reflux disease)   . Migraine     "I use to" (02/11/2013)  . Arthritis     "hands & feet" (02/11/2013)  . Peripheral neuropathy     [name] 02/11/2013  . B12 deficiency anemia     [name] 02/11/2013  . Chronic pain syndrome     [name] 02/11/2013  . UTI (lower urinary tract infection) 02/11/2013    [name] 02/11/2013  . Chronic edema     BLE/notes 02/11/2013   Past Surgical History:  Past Surgical History  Procedure Laterality Date  . Abdominal hysterectomy  ~ 1987; ~ 2004    "[place]; [place]" (02/11/2013)  . Cesarean section  6073; 1984; 1986  . Anterior cervical decomp/discectomy fusion  2009; 2011  . Tonsillectomy  1990's?  . Cataract extraction w/phaco  06/20/2011    Procedure: CATARACT EXTRACTION PHACO AND INTRAOCULAR LENS PLACEMENT (IOC);  Surgeon: [name] T. Gershon Crane;  Location: [facility];  Service: Ophthalmology;  Laterality: Left;  CDE 1.81  . Cataract extraction w/phaco  07/04/2011    Procedure: CATARACT EXTRACTION PHACO AND INTRAOCULAR LENS PLACEMENT (IOC);  Surgeon: [name] T. Gershon Crane;  Location: [facility];  Service: Ophthalmology;   Laterality: Right;  CDE: 1.76  . Yag laser application Right 04/17/6268    Procedure: YAG LASER APPLICATION;  Surgeon: [name] T. Gershon Crane, MD;  Location: [facility];  Service: Ophthalmology;  Laterality: Right;  . Yag laser application Left 4/85/4627    Procedure: YAG LASER APPLICATION;  Surgeon: [name] T. Gershon Crane, MD;  Location: [facility];  Service: Ophthalmology;  Laterality: Left;  . Appendectomy      Subjective Symptoms/Limitations Pertinent History: Patient states in April 2014 patient was living at home with her boyfriend and having frequent falls, knocking herself out frequently.  Patient cont difficulty ambulating when she went to MD discovered B12 deficiency.  She was hospitalized, put in ICU where she had a trach, feeding tube and spent the next few months.  She was progressed with therapy and transferred out to Nursing home where she also received therapy.  She was ultimately discharged home 05/09/2014 with home health and assistance. Now receiving PT services and referred for OT for eval and treatt of her bilateral hand weakness, numbness, pain  Limitations: fall  Patient Stated Goals: to get to where i can button my pants and tie my shoes, write Pain Assessment Currently in Pain?: Yes Pain Score: 6  Pain Location: Hand Pain Orientation: Right;Left Pain Type: Chronic pain   Assessment  11/03/13 1400  Assessment  Diagnosis hand weakness  Surgical Date (n/a )  Next MD Visit 03/032015  Prior Therapy acute, SNF and currently on PT services   Precautions  Precautions Fall  Precaution Comments ambulates with rollator  Required Braces or Orthoses (patient states she no longer has to wear BLE orthosis )  Balance Screen  Has the patient fallen in the past 6 months Yes  How many times? 3  Has the patient had a decrease in activity level because of a fear of falling?  No  Is the patient reluctant to leave their home because of a fear of falling?  No  Home Living  Family/patient expects to  be discharged to: Private residence  Living Arrangements Spouse/significant other (with boyfriend and assistant during the day )  Available Help at Discharge Friend(s)  Prior Function  Level of Independence Requires assistive device for independence (cane prior now with rollator)  Driving Yes  Vocation On disability (for her neck for the last couple of years)  Leisure Hobbies-yes (Comment)  Comments plays sweepstakes, Scientist, physiological, work Estate agent, play with grand baby   ADL  ADL Comments patient states she has diffiuclty tying shoes, holding a cup without a handle, writing, holding onto things, turning pages in book   Vision - History  Baseline Vision Wears glasses only for reading  Vision - Assessment  Eye Alignment WFL  Cognition  Overall Cognitive Status Within Functional Limits for tasks assessed  Arousal/Alertness Awake/alert  Orientation Level Oriented X4  Observation/Other Assessments  Other Assessments patient states her proprioception in her hands has gotten better but is still not as it should be.   Sensation  Light Touch Impaired by gross assessment  Proprioception Appears Intact (however improved from initial hospitalizaion )  Coordination  Gross Motor Movements are Fluid and Coordinated No  Fine Motor Movements are Fluid and Coordinated No  9 Hole Peg Test right 42.05, one drop;  left 40.83, no drops (patient with ataxic movement, difficulty with proprioception)  RUE AROM (degrees)  Right Composite Finger Extension (100% delayed movements)  Right Composite Finger Flexion (100% weak)  Right Thumb Opposition Digit 2;Digit 3;Digit 4;Digit 5 (delayed movements, proprioception )  RUE Strength  Grip (lbs) 45  Lateral Pinch 18 lbs  3 Point Pinch 15 lbs  LUE AROM (degrees)  Left Composite Finger Extension (100% delayed movements, proprioception >3rd digit)  Left Composite Finger Flexion (100% delayed movements, proprioception )  Left Thumb Opposition Digit 2;Digit  3;Digit 4;Digit 5 (delayed movements, propriocption > 4th, 5th )  LUE Strength  Grip (lbs) 41  Lateral Pinch 12 lbs  3 Point Pinch 14 lbs  Written Expression  Dominant Hand Right    Occupational Therapy Assessment and Plan OT Assessment and Plan Clinical Impression Statement: A:  Patient is a 48 yo right handed female who presents s/p extended illness, long hospitalization, weakness.  She presents with decreased  fine motor coordination, decreased grip/pinch strength, decreased endurance wtih bilateral UE for daily tasks.  Deficits are limiting patients independence with B/IADL tasks and leisure activities as well as her ability to write/sign her name.  Recommend patient woudl beneift from skilled OT services to maximize functional independence/potential.   Pt will benefit from skilled therapeutic intervention in order to improve on the following deficits: Impaired UE functional use;Decreased activity tolerance;Decreased coordination;Decreased strength;Pain OT Frequency: Min 2X/week OT Duration: 8 weeks OT Treatment/Interventions: Self-care/ADL training;Therapeutic activities;Therapeutic exercise;Neuromuscular education;Patient/family education;DME and/or AE instruction;Manual therapy;Modalities OT Plan: P:  Skilled OT intervention for strengthening, fine motor coordination, pain management to increase independence with B/IADL tasks and leisure activiites  Treatment Plan: AROM, strengthening, fine motor tasks, HEP, patient education    Goals Short Term Goals Time to Complete Short Term Goals: 4 weeks Short Term Goal 1: Patient will be educated  on HEP Short Term Goal 2: Patient will decrased 9hole peg score by at least 2 seconds with each hand to increase ability to pick up small objects Short Term Goal 3: Patient will increase bilateral hand strength by at least 3 pounds to increase ability to hold smaller objects without dropping Short Term Goal 4: Patient will improve ability to hold  pen in order to sign name legibly 4/4 trials  Short Term Goal 5: Patient will complete UE activity for at least 5 minutes to increase UE endurance for increased participation/independence in ADL activities.   Long Term Goals Time to Complete Long Term Goals: 8 weeks Long Term Goal 1: Patient will return to highest level of independence with all B/IADL and leisure activities Long Term Goal 2: Patient will improve 9 hole peg scores to less than 37 seconds with each to increase fine motor coordination for clothing fasteners, small objects Long Term Goal 3: Patient will improve bilateral hand strength >/= 50# each hand to increase independence to open containers Long Term Goal 4: Patient will demo ability to copy 5+ line paragraph with no need for rest break  with good legibility Long Term Goal 5: Patient   Problem List Patient Active Problem List   Diagnosis Date Noted  . Lack of coordination 11/03/2013  . Pernicious anemia 09/12/2013  . Hyperglycemia 09/12/2013  . Elevated transaminase level 09/12/2013  . Weakness 09/12/2013  . Ataxia 09/09/2013  . Difficulty in walking(719.7) 09/09/2013  . Weakness of both legs 09/09/2013  . Generalized weakness 02/11/2013  . Tobacco abuse 01/14/2013  . Bilateral leg edema 01/14/2013  . Pedal edema 01/13/2013  . Chronic pain syndrome 12/30/2012  . Reactive airway disease 12/30/2012  . Encephalopathy acute 07/25/2011  . 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    End of Session Activity Tolerance: Patient tolerated treatment well General Behavior During Therapy: [facility] for tasks assessed/performed OT Plan of Care OT Home Exercise Plan: grasping exercises, digit extension stretches OT Patient Instructions: demo, verbalized understanding Consulted and Agree with Plan of Care: Patient  GO Functional Assessment Tool Used: 9 Hole Peg Test:    right 42.05, one drop; left 40.83, no drops  Functional Limitation: Carrying, moving and handling objects  Carrying,  Moving and Handling Objects Current Status (Y1856): At least 40 percent but less than 60 percent impaired, limited or restricted  Carrying, Moving and Handling Objects Goal Status [phone]): At least 1 percent but less than 20 percent impaired, limited or restricted     Donney Rankins, OTR/L  11/03/2013, 4:41 PM  Physician Documentation Your signature is required to indicate approval of the treatment plan as stated above.  Please sign and either send electronically or make a copy of this report for your files and return this physician signed original.  Please mark one 1.__approve of plan  2. ___approve of plan with the following conditions.   ______________________________                                                          _____________________ Physician Signature  Date  

## 2013-11-06 ENCOUNTER — Ambulatory Visit (HOSPITAL_COMMUNITY)
Admission: RE | Admit: 2013-11-06 | Discharge: 2013-11-06 | Disposition: A | Discharge status: Home / Self Care | Payer: BC Managed Care – PPO | Source: Ambulatory Visit | Attending: Family Medicine | Admitting: Family Medicine

## 2013-11-06 NOTE — Progress Notes (Signed)
Physical Therapy Treatment Patient Details  Name: [name] MRN: [id number] Date of Birth: 1966-05-18  Today's Date: 11/06/2013 Time: 8527-7824 PT Time Calculation (min): 40 min Charges: Gait training x [phone]) NMR x 15'(4008-6761)   Visit#: 19 of 21  Re-eval: 11/12/13  Authorization: BCBS   Subjective: Symptoms/Limitations Symptoms: Pt states that she has been really working on her ankle exercises at home. Pain Assessment Currently in Pain?: Yes Pain Score: 5  Pain Location:  (Bilateral hands and feet)  Exercise/Treatments Balance Exercises Standing Tandem Stance: Limitations Tandem Stance Limitations: 1' bilateral  Tandem Gait: 2 reps Heel Raises: 10 reps Other Standing Exercises: Rocker board x 2' A/P R/L; Slant board stretch 2x30" Other Standing Exercises: Gait training in department with SPC in right hand and min assist   Seated Other Seated Exercises: BAPS L1 all directions x 10 with multimodal cueing Other Seated Exercises: Heel raise x 10   Physical Therapy Assessment and Plan PT Assessment and Plan Clinical Impression Statement: Treatment focus on improving gait mechanics and balance/stability. Pt requires multimodal cueing to improve sequencing and heel/toe pattern with gait training. Attempted BAPS in standing but this is currently too advanced for pt. Completed BAPS in seated position with multimodal cueing for technique. Pt requires multiple rest breaks throughout session but appears to tolerate treatment well. PT Plan: Continue to progress proprioceptive control and stability per PT POC. Focus on coordination and ankle strategy.    Problem List Patient Active Problem List   Diagnosis Date Noted  . Lack of coordination 11/03/2013  . Pernicious anemia 09/12/2013  . Hyperglycemia 09/12/2013  . Elevated transaminase level 09/12/2013  . Weakness 09/12/2013  . Ataxia 09/09/2013  . Difficulty in walking(719.7) 09/09/2013  . Weakness of both legs  09/09/2013  . Generalized weakness 02/11/2013  . Tobacco abuse 01/14/2013  . Bilateral leg edema 01/14/2013  . Pedal edema 01/13/2013  . Chronic pain syndrome 12/30/2012  . Reactive airway disease 12/30/2012  . Encephalopathy acute 07/25/2011  . 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

## 2013-11-07 ENCOUNTER — Ambulatory Visit (HOSPITAL_COMMUNITY): Payer: BC Managed Care – PPO | Admitting: Physical Therapy

## 2013-11-10 ENCOUNTER — Ambulatory Visit (HOSPITAL_COMMUNITY)
Admission: RE | Admit: 2013-11-10 | Discharge: 2013-11-10 | Disposition: A | Discharge status: Home / Self Care | Payer: BC Managed Care – PPO | Source: Ambulatory Visit | Attending: Family Medicine | Admitting: Family Medicine

## 2013-11-10 DIAGNOSIS — R269 Unspecified abnormalities of gait and mobility: Secondary | ICD-10-CM | POA: Insufficient documentation

## 2013-11-10 DIAGNOSIS — R29898 Other symptoms and signs involving the musculoskeletal system: Secondary | ICD-10-CM | POA: Insufficient documentation

## 2013-11-10 DIAGNOSIS — J449 Chronic obstructive pulmonary disease, unspecified: Secondary | ICD-10-CM | POA: Insufficient documentation

## 2013-11-10 DIAGNOSIS — IMO0001 Reserved for inherently not codable concepts without codable children: Secondary | ICD-10-CM | POA: Diagnosis not present

## 2013-11-10 DIAGNOSIS — J4489 Other specified chronic obstructive pulmonary disease: Secondary | ICD-10-CM | POA: Diagnosis not present

## 2013-11-10 DIAGNOSIS — R262 Difficulty in walking, not elsewhere classified: Secondary | ICD-10-CM

## 2013-11-10 DIAGNOSIS — R279 Unspecified lack of coordination: Secondary | ICD-10-CM | POA: Insufficient documentation

## 2013-11-10 DIAGNOSIS — R270 Ataxia, unspecified: Secondary | ICD-10-CM

## 2013-11-10 DIAGNOSIS — Z9181 History of falling: Secondary | ICD-10-CM | POA: Diagnosis not present

## 2013-11-10 NOTE — Progress Notes (Signed)
Physical Therapy Treatment Patient Details  Name: [name] MRN: [id number] Date of Birth: [birth year]  Today's Date: 11/10/2013 Time: 8099-8338 PT Time Calculation (min): 50 min Charge there ex 1345-1400: neurored 2505-397 Visit#: 20 of 21  Re-eval: 11/12/13     Subjective: Symptoms/Limitations Symptoms: Pt states she thinks her ankles are the weakest thing in her legs now Pain Assessment Pain Score: 7  Pain Location: Foot (feet and hands)    Exercise/Treatments     Balance Exercises Standing Tandem Stance: Eyes closed;2 reps;10 secs SLS with Vectors:  (forward only then to the side x 3 reps 10 secondss) Balance Beam: forward x 2 RT; retro x 1 Marching: 10 reps Sit to Stand: Standard surface Sit to Stand Limitations: 15 Other Standing Exercises: lift ball off 6" step then up to toes x 10      Seated Other Seated Exercises: Baps L2 x 10; t-band for dorsiflexion and eversion B x 10  Other Seated Exercises: nustep L 4 x 10:00     Physical Therapy Assessment and Plan PT Assessment and Plan Clinical Impression Statement: Pt continues to have ankle instability with ankles inverting with fatigue.  Pt given t-band exercises to work on this at home. Pt able to increase to L2 on baps.  PT Plan: reasssess pt next visit.    Goals  progressing  Problem List Patient Active Problem List   Diagnosis Date Noted  . Lack of coordination 11/03/2013  . Pernicious anemia 09/12/2013  . Hyperglycemia 09/12/2013  . Elevated transaminase level 09/12/2013  . Weakness 09/12/2013  . Ataxia 09/09/2013  . Difficulty in walking(719.7) 09/09/2013  . Weakness of both legs 09/09/2013  . Generalized weakness 02/11/2013  . Tobacco abuse 01/14/2013  . Bilateral leg edema 01/14/2013  . Pedal edema 01/13/2013  . Chronic pain syndrome 12/30/2012  . Reactive airway disease 12/30/2012  . Encephalopathy acute 07/25/2011  . 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

## 2013-11-12 ENCOUNTER — Ambulatory Visit (HOSPITAL_COMMUNITY)
Admission: RE | Admit: 2013-11-12 | Discharge: 2013-11-12 | Disposition: A | Discharge status: Home / Self Care | Payer: BC Managed Care – PPO | Source: Ambulatory Visit | Attending: Family Medicine | Admitting: Family Medicine

## 2013-11-12 DIAGNOSIS — M25673 Stiffness of unspecified ankle, not elsewhere classified: Secondary | ICD-10-CM | POA: Insufficient documentation

## 2013-11-12 DIAGNOSIS — R269 Unspecified abnormalities of gait and mobility: Secondary | ICD-10-CM | POA: Diagnosis not present

## 2013-11-12 DIAGNOSIS — J449 Chronic obstructive pulmonary disease, unspecified: Secondary | ICD-10-CM | POA: Diagnosis not present

## 2013-11-12 DIAGNOSIS — R29898 Other symptoms and signs involving the musculoskeletal system: Secondary | ICD-10-CM | POA: Diagnosis not present

## 2013-11-12 DIAGNOSIS — R279 Unspecified lack of coordination: Secondary | ICD-10-CM | POA: Diagnosis not present

## 2013-11-12 DIAGNOSIS — Z9181 History of falling: Secondary | ICD-10-CM | POA: Diagnosis not present

## 2013-11-12 DIAGNOSIS — M25676 Stiffness of unspecified foot, not elsewhere classified: Principal | ICD-10-CM

## 2013-11-12 DIAGNOSIS — IMO0001 Reserved for inherently not codable concepts without codable children: Secondary | ICD-10-CM | POA: Diagnosis not present

## 2013-11-12 NOTE — Progress Notes (Signed)
Occupational Therapy Treatment Patient Details  Name: [name] MRN: [id number] Date of Birth: [birth year]  Today's Date: 11/06/2013 Time: 1324-4010 OT Time Calculation (min): 40 min TherExercises 1435-1500 (25') 1500-1515 (15')  Visit#: 2 of 16  Re-eval: 12/01/13    Authorization: Medicare   Authorization Time Period: before 10th visit  Authorization Visit#: 2 of 10  Subjective Symptoms/Limitations Symptoms: S:  My tremors are better some days....  Pain Assessment Currently in Pain?: Yes Pain Score: 6  Pain Location: Hand Pain Orientation: Right;Left Pain Type: Chronic pain      Exercise/Treatments  11/06/13 1400  Elbow Exercises  Forearm Supination AROM;10 reps  Wrist Flexion AROM;10 reps  Wrist Extension AROM;10 reps   11/06/13 1400  Wrist Exercises  Forearm Supination AROM;10 reps  Wrist Flexion AROM;10 reps  Wrist Extension AROM;10 reps  Wrist Radial Deviation AROM;10 reps  Wrist Ulnar Deviation AROM;10 reps   11/06/13 1400  Hand Exercises  Opposition AROM;15 reps;Both  Sponges 10 each hand x 3.  translation to finger tips with moderate difficulty both hands  Tendon Glides x 5 reps each hand   Other Hand Exercises resisted clothes pin tree with alternating hands (all except black)         Occupational Therapy Assessment and Plan OT Assessment and Plan Clinical Impression Statement: A:  Patient with noted increased tremors in bilateral hands. Patient with iincreased difficulty with transitioning in hand to digits. Patient with increased fatigue following treatment this date. R>L OT Plan: P:  strengthening, resisted gripper, theraputty.   Goals Short Term Goals Short Term Goal 1: Patient will be educated  on HEP Short Term Goal 1 Progress: Progressing toward goal Short Term Goal 2: Patient will decrased 9hole peg score by at least 2 seconds with each hand to increase ability to pick up small objects Short Term Goal 2 Progress: Progressing toward  goal Short Term Goal 3: Patient will increase bilateral hand strength by at least 3 pounds to increase ability to hold smaller objects without dropping Short Term Goal 3 Progress: Progressing toward goal Short Term Goal 4: Patient will improve ability to hold pen in order to sign name legibly 4/4 trials  Short Term Goal 4 Progress: Progressing toward goal Short Term Goal 5: Patient will complete UE activity for at least 5 minutes to increase UE endurance for increased participation/independence in ADL activities.   Short Term Goal 5 Progress: Progressing toward goal Long Term Goals Long Term Goal 1: Patient will return to highest level of independence with all B/IADL and leisure activities Long Term Goal 1 Progress: Progressing toward goal Long Term Goal 2: Patient will improve 9 hole peg scores to less than 37 seconds with each to increase fine motor coordination for clothing fasteners, small objects Long Term Goal 2 Progress: Progressing toward goal Long Term Goal 3: Patient will improve bilateral hand strength >/= 50# each hand to increase independence to open containers Long Term Goal 3 Progress: Progressing toward goal Long Term Goal 4: Patient will demo ability to copy 5+ line paragraph with no need for rest break  with good legibility Long Term Goal 4 Progress: Progressing toward goal Long Term Goal 5:     Problem List Patient Active Problem List   Diagnosis Date Noted  . Lack of coordination 11/03/2013  . Pernicious anemia 09/12/2013  . Hyperglycemia 09/12/2013  . Elevated transaminase level 09/12/2013  . Weakness 09/12/2013  . Ataxia 09/09/2013  . Difficulty in walking(719.7) 09/09/2013  . Weakness of both legs  09/09/2013  . Generalized weakness 02/11/2013  . Tobacco abuse 01/14/2013  . Bilateral leg edema 01/14/2013  . Pedal edema 01/13/2013  . Chronic pain syndrome 12/30/2012  . Reactive airway disease 12/30/2012  . Encephalopathy acute 07/25/2011  . 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

## 2013-11-12 NOTE — Evaluation (Signed)
Physical Therapy Evaluation(reasssessment)  Patient Details  Name: [name] MRN: [id number] Date of Birth: January 15, 1966  Today's Date: 11/12/2013 Time: 5271-2929 [name] Time Calculation (min): 48 min Charge mm test 1345-1355; physical performance 1355-1410; there activity 1410-1419; there ex              Visit#: 21 of 27  Re-eval: 11/12/13    Authorization: BC[employer]    Authorization Time Period:    Authorization Visit#:   of     Past Medical History:  Past Medical History  Diagnosis Date  . Anxiety   . Depression   . COPD (chronic obstructive pulmonary disease)   . MS (multiple sclerosis)   . Fibromyalgia   . Impaired fasting glucose   . 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 Eating disorder   . Chronic low back pain   . PONV (postoperative nausea and vomiting)   . Chronic bronchitis     "yearly" (02/11/2013)  . Borderline diabetes   . GERD (gastroesophageal reflux disease)   . Migraine     "I use to" (02/11/2013)  . Arthritis     "hands & feet" (02/11/2013)  . Peripheral neuropathy     Hattie Perch 02/11/2013  . B12 deficiency anemia     Hattie Perch 02/11/2013  . Chronic pain syndrome     Hattie Perch 02/11/2013  . UTI (lower urinary tract infection) 02/11/2013    Hattie Perch 02/11/2013  . Chronic edema     BLE/notes 02/11/2013   Past Surgical History:  Past Surgical History  Procedure Laterality Date  . Abdominal hysterectomy  ~ 1987; ~ 2004    "[place]; [place]" (02/11/2013)  . Cesarean section  0903; 1984; 1986  . Anterior cervical decomp/discectomy fusion  2009; 2011  . Tonsillectomy  1990's?  . Cataract extraction w/phaco  06/20/2011    Procedure: CATARACT EXTRACTION PHACO AND INTRAOCULAR LENS PLACEMENT (IOC);  Surgeon: [name] T. Nile Riggs;  Location: [facility];  Service: Ophthalmology;  Laterality: Left;  CDE 1.81  . Cataract extraction w/phaco  07/04/2011    Procedure: CATARACT EXTRACTION PHACO AND INTRAOCULAR LENS PLACEMENT (IOC);  Surgeon: [name] T. Nile Riggs;  Location:  [facility];  Service: Ophthalmology;  Laterality: Right;  CDE: 1.76  . Yag laser application Right 12/03/2012    Procedure: YAG LASER APPLICATION;  Surgeon: [name] T. Nile Riggs, MD;  Location: [facility];  Service: Ophthalmology;  Laterality: Right;  . Yag laser application Left 12/17/2012    Procedure: YAG LASER APPLICATION;  Surgeon: [name] T. Nile Riggs, MD;  Location: [facility];  Service: Ophthalmology;  Laterality: Left;  . Appendectomy      Subjective Symptoms/Limitations Symptoms: [name] is completing her HEP> How long can you sit comfortably?: sitting has not been a problem How long can you stand comfortably?: [name] able to come sit to stand using UE on one attempt and stand x  minutes. Able to stand x 8 minutes last reevaluation was four minutes. How long can you walk comfortably?: [name] is walking with and without an walker she states the longest she has walked has beeen five minutes.  [name] walked in dept without an assistive device x 5 miinutes.  LE B fatigued after ambulation  Precautions/Restrictions  Precautions Precautions: Fall Precaution Comments: ambulates with rollator Required Braces or Orthoses:  (patient states she no longer has to wear BLE orthosis )  Balance Screening    Prior Functioning  Home Living Family/patient expects to be discharged to:: Private residence Living Arrangements: Spouse/significant other (with boyfriend and assistant during  the day ) Available Help at Discharge: Friend(s) Prior Function Level of Independence: Requires assistive device for independence (cane prior now with rollator) Driving: Yes Vocation: On disability (for her neck for the last couple of years) Leisure: Hobbies-yes (Comment) Comments: plays sweepstakes, watch tv, 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   Assessment RLE AROM (degrees) Right Ankle Dorsiflexion: -18 Right Ankle Plantar Flexion: 50 Right Ankle Inversion: 20 Right Ankle Eversion: 12 RLE Strength Right Hip Flexion: 5/5 Right Hip Extension: 5/5 Right Hip ABduction: 5/5 Right Knee Flexion: 5/5 Right Knee Extension: 5/5 Right Ankle Dorsiflexion: 3/5 Right Ankle Plantar Flexion: 4/5 Right Ankle Inversion:  (5-/5 was 3+/5) Right Ankle Eversion:  (4-/5 was 3+/5) LLE AROM (degrees) Left Ankle Dorsiflexion: -25 Left Ankle Plantar Flexion: 50 Left Ankle Inversion: 22 Left Ankle Eversion: 15 LLE Strength Left Hip Flexion: 5/5 Left Hip Extension: 5/5 Left Hip ABduction: 5/5 Left Knee Flexion: 5/5 Left Knee Extension: 5/5 Left Ankle Dorsiflexion: 3/5 (in available range [name] has contractures.) Left Ankle Plantar Flexion: 4/5 Left Ankle Inversion:  (4+/5 was 3+/5) Left Ankle Eversion:  (4-/5 was 5/5)  Exercise/Treatments Mobility/Balance  Berg Balance Test Sit to Stand: Able to stand without using hands and stabilize independently Standing Unsupported: Able to stand safely 2 minutes Sitting with Back Unsupported but Feet Supported on Floor or Stool: Able to sit safely and securely 2 minutes Stand to Sit: Sits safely with minimal use of hands Transfers: Able to transfer safely, minor use of hands Standing Unsupported with Eyes Closed: Able to stand 10 seconds with supervision Standing Ubsupported with Feet Together: Able to place feet together independently and stand for 1 minute with supervision From Standing, Reach Forward with Outstretched Arm: Can reach confidently >25 cm (10") From Standing Position, Pick up Object from Floor: Able to pick up shoe safely and easily From Standing Position, Turn to Look Behind Over each Shoulder: Looks behind from both sides and weight shifts well Turn 360  Degrees: Able to turn 360 degrees safely one side only in 4 seconds or less Standing Unsupported, Alternately Place Feet on Step/Stool: Able to stand independently and safely and complete 8 steps in 20 seconds Standing Unsupported, One Foot in Front: Able to plae foot ahead of the other independently and hold 30 seconds Standing on One Leg: Tries to lift leg/unable to hold 3 seconds but remains standing independently Total Score: 49       Physical Therapy Assessment and Plan [name] Assessment and Plan Clinical Impression Statement: [name] has improved in all aspects.  Majority of problems stem from decreasd ROM and strength of B dorsiflexors of ankles. We will focus on stretching and strengthing of ankle dorsiflexors [name] Frequency: Min 2X/week [name] Duration:  (3 weeks ) [name] Plan: concentrate on ROM and strength of doriflexors.    Goals Home Exercise Program [name]/caregiver will Perform Home Exercise Program: For improved balance [name] Short Term Goals Time to Complete Short Term Goals: 2 weeks [name] Short Term Goal 1: [name] to be able to come from sit to stand on second attempt [name] Short Term Goal 1 - Progress: Met [name] Short Term Goal 2: [name] to be able to stand I x 5 seconds with no UE assist [name] Short Term Goal 2 - Progress:  Met [name] Short Term Goal 3: [name] to be able to walk 200 feet in an two minute period of time [name] Short Term Goal 3 - Progress: Met [name] Long Term Goals [name] Long Term Goal 1: [name] proprioception improved to correct 3/5 times [name] Long Term Goal 1 - Progress: Met [name] Long Term Goal 2: [name] to be able to come sit to stand on one attempt [name] Long Term Goal 2 - Progress: Met Long Term Goal 3: [name] to be able to ambulate 250 ft in two minutes Long Term Goal 3 Progress: Met Long Term Goal 4: [name] to be able to walk for 15 minute wihout stopping Long Term Goal 4 Progress: Progressing toward goal [name] Long Term Goal 5: New Goal as of 10/13/2013-  [name] to improve Berg balance score by 8 to reduce risk of falling and allow  [name] to ambulate safely with a cane. Long Term Goal 5 Progress: Met Additional [name] Long Term Goals?: Yes [name] Long Term Goal 6: New goal as of 11/12/2013- [name] dorsiflexion B to neutral for a more normalized gt.  Problem List Patient Active Problem List   Diagnosis Date Noted  . Stiffness of joint, not elsewhere classified, ankle and foot 11/12/2013  . Lack of coordination 11/03/2013  . Pernicious anemia 09/12/2013  . Hyperglycemia 09/12/2013  . Elevated transaminase level 09/12/2013  . Weakness 09/12/2013  . Ataxia 09/09/2013  . Difficulty in walking(719.7) 09/09/2013  . Weakness of both legs 09/09/2013  . Generalized weakness 02/11/2013  . Tobacco abuse 01/14/2013  . Bilateral leg edema 01/14/2013  . Pedal edema 01/13/2013  . Chronic pain syndrome 12/30/2012  . Reactive airway disease 12/30/2012  . Encephalopathy acute 07/25/2011  . 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    [name] - End of Session Equipment Utilized During Treatment: Gait belt Activity Tolerance: Patient tolerated treatment well General Behavior During Therapy: [facility] for tasks assessed/performed  GP Functional Assessment Tool Used: clinical judgement Functional Limitation: Mobility: Walking and moving around Mobility: Walking and Moving Around Current Status ([id number]): At least 20 percent but less than 40 percent impaired, limited or restricted Mobility: Walking and Moving Around Goal Status [phone]): At least 60 percent but less than 80 percent impaired, limited or restricted  Keshauna Degraffenreid,CINDY 11/12/2013, 2:37 PM  Physician Documentation Your signature is required to indicate approval of the treatment plan as stated above.  Please sign and either send electronically or make a copy of this report for your files and return this physician signed original.   Please mark one 1.__approve of plan  2. ___approve of plan with the following conditions.   ______________________________                                                           _____________________ Physician Signature  Date  

## 2013-11-14 ENCOUNTER — Ambulatory Visit (HOSPITAL_COMMUNITY)
Admission: RE | Admit: 2013-11-14 | Discharge: 2013-11-14 | Disposition: A | Discharge status: Home / Self Care | Payer: BC Managed Care – PPO | Source: Ambulatory Visit | Attending: Family Medicine | Admitting: Family Medicine

## 2013-11-14 DIAGNOSIS — R279 Unspecified lack of coordination: Secondary | ICD-10-CM | POA: Diagnosis not present

## 2013-11-14 DIAGNOSIS — J449 Chronic obstructive pulmonary disease, unspecified: Secondary | ICD-10-CM | POA: Diagnosis not present

## 2013-11-14 DIAGNOSIS — Z9181 History of falling: Secondary | ICD-10-CM | POA: Diagnosis not present

## 2013-11-14 DIAGNOSIS — R269 Unspecified abnormalities of gait and mobility: Secondary | ICD-10-CM | POA: Diagnosis not present

## 2013-11-14 DIAGNOSIS — R29898 Other symptoms and signs involving the musculoskeletal system: Secondary | ICD-10-CM | POA: Diagnosis not present

## 2013-11-14 DIAGNOSIS — M25676 Stiffness of unspecified foot, not elsewhere classified: Principal | ICD-10-CM

## 2013-11-14 DIAGNOSIS — M25673 Stiffness of unspecified ankle, not elsewhere classified: Secondary | ICD-10-CM

## 2013-11-14 DIAGNOSIS — IMO0001 Reserved for inherently not codable concepts without codable children: Secondary | ICD-10-CM | POA: Diagnosis not present

## 2013-11-14 DIAGNOSIS — R270 Ataxia, unspecified: Secondary | ICD-10-CM

## 2013-11-14 DIAGNOSIS — R262 Difficulty in walking, not elsewhere classified: Secondary | ICD-10-CM

## 2013-11-14 NOTE — Progress Notes (Signed)
Physical Therapy Treatment Patient Details  Name: ALETHIA MELENDREZ MRN: [id number] Date of Birth: [birth year]  Today's Date: 11/14/2013 Time: 5498-2641 PT Time Calculation (min): 48 min Charge: there ex 5830-9407 Visit#: 22 of 27  Re-eval: 12/03/13    Authorization: BCBS   Subjective: Symptoms/Limitations Symptoms: Pt has no complaints  Exercise/Treatments   Aerobic Exercises Stationary Bike: nustep L 4 x 8::00   Ankle Exercises - Standing Heel Raises: 10 reps Toe Raise: 10 reps;Limitations Toe Raise Limitations: with back against the wall Ankle Exercises - Seated Towel Crunch: 3 reps Marble Pickup: attempted. BAPS: Sitting;Level 3;10 reps Ankle Exercises - Supine Isometrics:  (all B x 10) T-Band: for dorsiflexion; plantarflexion and eversion x 10 B Other Supine Ankle Exercises: prone PROM for dorsiflexion      Physical Therapy Assessment and Plan PT Assessment and Plan Clinical Impression Statement: Pt began new regieme with emphasis on ankle( ankle doc flow sheet).  Pt needs manual stretching of heelcords as well as improved stability of ankle mm. PT Frequency: Min 2X/week PT Plan: concentrate on increasing the stability of the ankles all exercises are done B for ankles     Goals    Problem List Patient Active Problem List   Diagnosis Date Noted  . Stiffness of joint, not elsewhere classified, ankle and foot 11/12/2013  . Lack of coordination 11/03/2013  . Pernicious anemia 09/12/2013  . Hyperglycemia 09/12/2013  . Elevated transaminase level 09/12/2013  . Weakness 09/12/2013  . Ataxia 09/09/2013  . Difficulty in walking(719.7) 09/09/2013  . Weakness of both legs 09/09/2013  . Generalized weakness 02/11/2013  . Tobacco abuse 01/14/2013  . Bilateral leg edema 01/14/2013  . Pedal edema 01/13/2013  . Chronic pain syndrome 12/30/2012  . Reactive airway disease 12/30/2012  . Encephalopathy acute 07/25/2011  . 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       GP     Abagale Boulos,CINDY 11/14/2013, 4:08 PM

## 2013-11-14 NOTE — Progress Notes (Signed)
Occupational Therapy Treatment Patient Details  Name: [name] MRN: [id number] Date of Birth: 07-21-1966  Today's Date: 11/10/2013 Time: 7564-3329 OT Time Calculation (min): 46 min Therexercises 5188-4166 (72') TherActivities 0630-1601 (20')  Visit#: 3 of 16  Re-eval: 12/01/13    Authorization: Medicare   Authorization Time Period: before 10th visit  Authorization Visit#: 3 of 10  Subjective Symptoms/Limitations Symptoms: S:  It is hard for me to believe how hard those exercises were. Pain Assessment Currently in Pain?: Yes Pain Score: 6  Pain Location: Hand Pain Orientation: Right;Left Pain Type: Chronic pain   Exercise/Treatments  11/10/13 1400  Elbow Exercises  Elbow Flexion AROM;10 reps;Bar weights/barbell  Bar Weights/Barbell (Elbow Flexion) 2 lbs  Elbow Extension AROM;10 reps;Bar weights/barbell  Bar Weights/Barbell (Elbow Extension) 2 lbs  Forearm Supination AROM;10 reps;Bar weights/barbell  Forearm Pronation 20 reps  Wrist Flexion AROM;10 reps;Bar weights/barbell  Bar Weights/Barbell (Wrist Flexion) 2 lbs  Wrist Extension AROM;10 reps;Bar weights/barbell  Bar Weights/Barbell (Wrist Extension) 2 lbs  Additional Elbow Exercises  Sponges 10 each hand x 3.  translation to finger tips with moderate difficulty both hands  Theraputty Flatten;Roll;Pinch (putty press with 3/4" dowel)  Theraputty - Flatten yellow  Theraputty - Roll yellow    11/10/13 1400  Wrist Exercises  Forearm Supination AROM;10 reps;Bar weights/barbell  Forearm Pronation 20 reps  Wrist Flexion AROM;10 reps;Bar weights/barbell  Bar Weights/Barbell (Wrist Flexion) 2 lbs  Wrist Extension AROM;10 reps;Bar weights/barbell  Bar Weights/Barbell (Wrist Extension) 2 lbs  Wrist Radial Deviation AROM;10 reps;Bar weights/barbell (2#)  Wrist Ulnar Deviation AROM;10 reps;Bar weights/barbell (2#)  Fine Motor Coordination  Large Pegboard 25 pegs each hand  Additional Wrist Exercises  Sponges 10  each hand x 3.  translation to finger tips with moderate difficulty both hands  Theraputty Flatten;Roll;Pinch (putty press with 3/4" dowel)  Theraputty - Flatten yellow  Theraputty - Roll yellow  Theraputty - Pinch yellow    11/10/13 1400  Hand Exercises  Theraputty Flatten;Roll;Pinch (putty press with 3/4" dowel)  Theraputty - Flatten yellow  Theraputty - Roll yellow  Theraputty - Pinch yellow  Sponges 10 each hand x 3.  translation to finger tips with moderate difficulty both hands  Large Pegboard 25 pegs each hand  Other Hand Exercises resisted gripper level 1, silver spring to remove pegs 25 each hand with sustained grip dring reach with min difficulty     Occupational Therapy Assessment and Plan OT Assessment and Plan Clinical Impression Statement: A:  Patient with noted and reported increased fatigue with resisted gripper tasks.  Continued decreased tremors at beginning of session however noted increased with fatigue and sustained use this date.  Patient with good tolerance of weighted exercises this date.  OT Plan: P: progress strengthening, small pegs    Goals Short Term Goals Short Term Goal 1: Patient will be educated  on HEP Short Term Goal 1 Progress: Progressing toward goal Short Term Goal 2: Patient will decrased 9hole peg score by at least 2 seconds with each hand to increase ability to pick up small objects Short Term Goal 2 Progress: Progressing toward goal Short Term Goal 3: Patient will increase bilateral hand strength by at least 3 pounds to increase ability to hold smaller objects without dropping Short Term Goal 3 Progress: Progressing toward goal Short Term Goal 4: Patient will improve ability to hold pen in order to sign name legibly 4/4 trials  Short Term Goal 4 Progress: Progressing toward goal Short Term Goal 5: Patient will complete UE activity  for at least 5 minutes to increase UE endurance for increased participation/independence in ADL activities.    Short Term Goal 5 Progress: Progressing toward goal Long Term Goals Long Term Goal 1: Patient will return to highest level of independence with all B/IADL and leisure activities Long Term Goal 1 Progress: Progressing toward goal Long Term Goal 2: Patient will improve 9 hole peg scores to less than 37 seconds with each to increase fine motor coordination for clothing fasteners, small objects Long Term Goal 2 Progress: Progressing toward goal Long Term Goal 3: Patient will improve bilateral hand strength >/= 50# each hand to increase independence to open containers Long Term Goal 3 Progress: Progressing toward goal Long Term Goal 4: Patient will demo ability to copy 5+ line paragraph with no need for rest break  with good legibility Long Term Goal 4 Progress: Progressing toward goal  Problem List Patient Active Problem List   Diagnosis Date Noted  . Stiffness of joint, not elsewhere classified, ankle and foot 11/12/2013  . Lack of coordination 11/03/2013  . Pernicious anemia 09/12/2013  . Hyperglycemia 09/12/2013  . Elevated transaminase level 09/12/2013  . Weakness 09/12/2013  . Ataxia 09/09/2013  . Difficulty in walking(719.7) 09/09/2013  . Weakness of both legs 09/09/2013  . Generalized weakness 02/11/2013  . Tobacco abuse 01/14/2013  . Bilateral leg edema 01/14/2013  . Pedal edema 01/13/2013  . Chronic pain syndrome 12/30/2012  . Reactive airway disease 12/30/2012  . Encephalopathy acute 07/25/2011  . 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    End of Session Activity Tolerance: Patient tolerated treatment well General Behavior During Therapy: [facility] for tasks assessed/performed  GO     Donney Rankins, OTR/L  11/10/2013, 4:42 PM

## 2013-11-14 NOTE — Progress Notes (Signed)
Occupational Therapy Treatment Patient Details  Name: [name] MRN: [id number] Date of Birth: Sep 19, 1966  Today's Date: 11/14/2013 Time: 6948-5462 OT Time Calculation (min): 40 min Therapeutic exercises 40' Visit#: 4 of 16  Re-eval: 12/01/13    Authorization: Medicare   Authorization Time Period: before 10th visit  Authorization Visit#: 4 of 10  Subjective  S:  Im doing ok.  I do some cleaning at home.  I dust and clean up  a bit.  I cant tie shoes or write very well. Pain Assessment Currently in Pain?: Yes Pain Score: 6  Pain Location: Hand Pain Orientation: Right;Left Pain Type: Chronic pain  Precautions/Restrictions     Exercise/Treatments Hand Exercises Theraputty: Flatten;Roll;Grip;Locate Pegs Theraputty - Flatten: pink Theraputty - Roll: pink Theraputty - Grip: pink Theraputty - Locate Pegs: pink - 6 beads with each hand, more difficulty with left hand vs right locating pegs Sponges: left 7, 8  right 9, 10 Other Hand Exercises: pinch tree to work on wrist stability and dynamic tripod grasp  Other Hand Exercises: in hand manipulation with small beads     Activities of Daily Living Activities of Daily Living: stood at table to wash table off and dry the table.  No difficulties with loss of balance or fatigue  Occupational Therapy Assessment and Plan OT Assessment and Plan Clinical Impression Statement: A:  Treatment focus on hand strengthening and tripod grasp strengthening for increased independence with handwriting and tie shoes. OT Plan: P:  Focus on handwriting, snaps, buttons, zippers.   Goals Short Term Goals Short Term Goal 1: Patient will be educated  on HEP Short Term Goal 2: Patient will decrased 9hole peg score by at least 2 seconds with each hand to increase ability to pick up small objects Short Term Goal 3: Patient will increase bilateral hand strength by at least 3 pounds to increase ability to hold smaller objects without dropping Short Term  Goal 4: Patient will improve ability to hold pen in order to sign name legibly 4/4 trials  Short Term Goal 5: Patient will complete UE activity for at least 5 minutes to increase UE endurance for increased participation/independence in ADL activities.   Long Term Goals Long Term Goal 1: Patient will return to highest level of independence with all B/IADL and leisure activities Long Term Goal 2: Patient will improve 9 hole peg scores to less than 37 seconds with each to increase fine motor coordination for clothing fasteners, small objects Long Term Goal 3: Patient will improve bilateral hand strength >/= 50# each hand to increase independence to open containers Long Term Goal 4: Patient will demo ability to copy 5+ line paragraph with no need for rest break  with good legibility  Problem List Patient Active Problem List   Diagnosis Date Noted  . Stiffness of joint, not elsewhere classified, ankle and foot 11/12/2013  . Lack of coordination 11/03/2013  . Pernicious anemia 09/12/2013  . Hyperglycemia 09/12/2013  . Elevated transaminase level 09/12/2013  . Weakness 09/12/2013  . Ataxia 09/09/2013  . Difficulty in walking(719.7) 09/09/2013  . Weakness of both legs 09/09/2013  . Generalized weakness 02/11/2013  . Tobacco abuse 01/14/2013  . Bilateral leg edema 01/14/2013  . Pedal edema 01/13/2013  . Chronic pain syndrome 12/30/2012  . Reactive airway disease 12/30/2012  . Encephalopathy acute 07/25/2011  . 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

## 2013-11-17 ENCOUNTER — Ambulatory Visit (HOSPITAL_COMMUNITY)
Admission: RE | Admit: 2013-11-17 | Discharge: 2013-11-17 | Disposition: A | Discharge status: Home / Self Care | Payer: BC Managed Care – PPO | Source: Ambulatory Visit | Attending: Family Medicine | Admitting: Family Medicine

## 2013-11-17 DIAGNOSIS — R269 Unspecified abnormalities of gait and mobility: Secondary | ICD-10-CM | POA: Diagnosis not present

## 2013-11-17 DIAGNOSIS — R29898 Other symptoms and signs involving the musculoskeletal system: Secondary | ICD-10-CM | POA: Diagnosis not present

## 2013-11-17 DIAGNOSIS — IMO0001 Reserved for inherently not codable concepts without codable children: Secondary | ICD-10-CM | POA: Diagnosis not present

## 2013-11-17 DIAGNOSIS — Z9181 History of falling: Secondary | ICD-10-CM | POA: Diagnosis not present

## 2013-11-17 DIAGNOSIS — R279 Unspecified lack of coordination: Secondary | ICD-10-CM | POA: Diagnosis not present

## 2013-11-17 DIAGNOSIS — J449 Chronic obstructive pulmonary disease, unspecified: Secondary | ICD-10-CM | POA: Diagnosis not present

## 2013-11-17 NOTE — Progress Notes (Signed)
Physical Therapy Treatment Patient Details  Name: [name] MRN: [id number] Date of Birth: 15-Sep-1966  Today's Date: 11/17/2013 Time: 1355-1440 PT Time Calculation (min): 45 min Visit#: 23 of 27  Re-eval: 12/03/13 Authorization: BCBS  Charges:  therex 1355-1420 (25'), manual 3295-1884 (16')  Subjective:  Pt states she's been walking with her SPC more.  States her feet hurt her badly in the morning when she first gets OOB.  Exercise/Treatments  Ankle Exercises - Seated Towel Crunch: 5 reps;Limitations Towel Crunch Limitations: one at a time Towel Inversion/Eversion: 5 reps;Limitations BAPS: Sitting;Level 3;10 reps Ankle Exercises - Supine T-Band: for dorsiflexion, inversion and eversion x 10 B Other Supine Ankle Exercises: prone PROM for dorsiflexion     Manual Therapy Manual Therapy: Other (comment) Other Manual Therapy: manual techniques, MFR to bilateral plantar surfaces of feet, PROM for dorsiflexion, ankle mobilizations, calf stretches  Physical Therapy Assessment and Plan PT Assessment and Plan Clinical Impression Statement: Pt requires max therapist facilitation to perform therex isolating correct mm.  Encouraged pt to complete stretching activites at home for bilateral ankles and calf mm.  Suggested pt request night splints for bilateral ankles to help decrease plantar flexion.  Large spasm in arch of each foot due to increased tone; able to decrease 50% with manual techniques.  Encouraged pt to position feet underneath her with heels flat when sitting to promote increasing dorsiflexion. PT Frequency: Min 2X/week PT Plan: Continue with strengthening of ankles, PROM and manual techniques to decrease tone and spasm.     Problem List Patient Active Problem List   Diagnosis Date Noted  . Stiffness of joint, not elsewhere classified, ankle and foot 11/12/2013  . Lack of coordination 11/03/2013  . Pernicious anemia 09/12/2013  . Hyperglycemia 09/12/2013  . Elevated  transaminase level 09/12/2013  . Weakness 09/12/2013  . Ataxia 09/09/2013  . Difficulty in walking(719.7) 09/09/2013  . Weakness of both legs 09/09/2013  . Generalized weakness 02/11/2013  . Tobacco abuse 01/14/2013  . Bilateral leg edema 01/14/2013  . Pedal edema 01/13/2013  . Chronic pain syndrome 12/30/2012  . Reactive airway disease 12/30/2012  . Encephalopathy acute 07/25/2011  . 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

## 2013-11-17 NOTE — Progress Notes (Signed)
Occupational Therapy Treatment Patient Details  Name: [name] MRN: [id number] Date of Birth: [birth year]  Today's Date: 11/17/2013 Time: 6712-4580 OT Time Calculation (min): 38 min Paraffin 1442-1457 (15') TherExercises 1457-1520 (23')   Visit#: 5 of 16  Re-eval: 12/01/13    Authorization: Medicare   Authorization Time Period: before 10th visit  Authorization Visit#: 5 of 10  Subjective Symptoms/Limitations Symptoms: S:  they arent that bad but they still hurt...  Pain Assessment Currently in Pain?: Yes Pain Score: 5  Pain Location: Hand Pain Orientation: Right;Left Pain Type: Chronic pain   Exercise/Treatments Hand Exercises Digit Composite Abduction: AROM;5 reps;Both Digit Composite Adduction: AROM;5 reps;Both Thumb Opposition: to all digits walk foward and back x 5 each hand with increased difficulty with R this date.  Tendon Glides: x5 Reps each hand  Small Pegboard: 25pegs with alternating hands; remove with tweezers x 12 each hand with increased difficulty with right hand this date Other Hand Exercises: handwriting activity of copying small paragraph this date as well as name and date.  with increased time.     Manual Therapy Manual Therapy: Other (comment) Splinting Splinting: Paraffin wax to bilateral hands x 12 mins this date for pain management and prep for fine motor tasks.   Occupational Therapy Assessment and Plan OT Assessment and Plan Clinical Impression Statement: A:  Patient with noted increased tremors R>L and increased pain in bilateral hands.  Patient tolerated paraffin this date with excellent success lowering pain to 2/10 in bilateral hands.  patient states she may have one at home, encouraged her to set it up for daily use to assist with pain management.  Patient worked on [employer], [employer] this date wtih improved legibility however cont with increased complaint of fatigue following task.  encouraged her to work on copying longer paragraphs for home  to increased endurance.  verbalized good understanding.  OT Plan: P: clothing fasteners, fine motor, putty exercises, putty for home.    Goals Short Term Goals Short Term Goal 1: Patient will be educated  on HEP Short Term Goal 1 Progress: Progressing toward goal Short Term Goal 2: Patient will decrased 9hole peg score by at least 2 seconds with each hand to increase ability to pick up small objects Short Term Goal 2 Progress: Progressing toward goal Short Term Goal 3: Patient will increase bilateral hand strength by at least 3 pounds to increase ability to hold smaller objects without dropping Short Term Goal 3 Progress: Progressing toward goal Short Term Goal 4: Patient will improve ability to hold pen in order to sign name legibly 4/4 trials  Short Term Goal 4 Progress: Progressing toward goal Short Term Goal 5: Patient will complete UE activity for at least 5 minutes to increase UE endurance for increased participation/independence in ADL activities.   Short Term Goal 5 Progress: Progressing toward goal Long Term Goals Long Term Goal 1: Patient will return to highest level of independence with all B/IADL and leisure activities Long Term Goal 1 Progress: Progressing toward goal Long Term Goal 2: Patient will improve 9 hole peg scores to less than 37 seconds with each to increase fine motor coordination for clothing fasteners, small objects Long Term Goal 2 Progress: Progressing toward goal Long Term Goal 3: Patient will improve bilateral hand strength >/= 50# each hand to increase independence to open containers Long Term Goal 3 Progress: Progressing toward goal Long Term Goal 4: Patient will demo ability to copy 5+ line paragraph with no need for rest break  with good  legibility Long Term Goal 4 Progress: Progressing toward goal  Problem List Patient Active Problem List   Diagnosis Date Noted  . Stiffness of joint, not elsewhere classified, ankle and foot 11/12/2013  . Lack of  coordination 11/03/2013  . Pernicious anemia 09/12/2013  . Hyperglycemia 09/12/2013  . Elevated transaminase level 09/12/2013  . Weakness 09/12/2013  . Ataxia 09/09/2013  . Difficulty in walking(719.7) 09/09/2013  . Weakness of both legs 09/09/2013  . Generalized weakness 02/11/2013  . Tobacco abuse 01/14/2013  . Bilateral leg edema 01/14/2013  . Pedal edema 01/13/2013  . Chronic pain syndrome 12/30/2012  . Reactive airway disease 12/30/2012  . Encephalopathy acute 07/25/2011  . 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

## 2013-11-18 ENCOUNTER — Telehealth: Payer: Self-pay | Admitting: *Deleted

## 2013-11-18 NOTE — Telephone Encounter (Signed)
[facility] to notify pt she is due for repeat diagnostic mammo of right breast. She declined to do in sept. Order is in epic. Need to ask pt when she wants to do this test and call and schedule.

## 2013-11-19 ENCOUNTER — Ambulatory Visit (HOSPITAL_COMMUNITY): Payer: BC Managed Care – PPO | Admitting: *Deleted

## 2013-11-19 NOTE — Telephone Encounter (Signed)
Diagnostic mammo scheduled aph 11/26/13. At 2:45. Pt notified.

## 2013-11-21 ENCOUNTER — Ambulatory Visit (HOSPITAL_COMMUNITY)
Admission: RE | Admit: 2013-11-21 | Discharge: 2013-11-21 | Disposition: A | Discharge status: Home / Self Care | Payer: BC Managed Care – PPO | Source: Ambulatory Visit | Attending: Family Medicine | Admitting: Family Medicine

## 2013-11-21 DIAGNOSIS — R279 Unspecified lack of coordination: Secondary | ICD-10-CM | POA: Diagnosis not present

## 2013-11-21 DIAGNOSIS — R29898 Other symptoms and signs involving the musculoskeletal system: Secondary | ICD-10-CM | POA: Diagnosis not present

## 2013-11-21 DIAGNOSIS — J449 Chronic obstructive pulmonary disease, unspecified: Secondary | ICD-10-CM | POA: Diagnosis not present

## 2013-11-21 DIAGNOSIS — Z9181 History of falling: Secondary | ICD-10-CM | POA: Diagnosis not present

## 2013-11-21 DIAGNOSIS — R269 Unspecified abnormalities of gait and mobility: Secondary | ICD-10-CM | POA: Diagnosis not present

## 2013-11-21 DIAGNOSIS — R270 Ataxia, unspecified: Secondary | ICD-10-CM

## 2013-11-21 DIAGNOSIS — IMO0001 Reserved for inherently not codable concepts without codable children: Secondary | ICD-10-CM | POA: Diagnosis not present

## 2013-11-21 DIAGNOSIS — M25676 Stiffness of unspecified foot, not elsewhere classified: Principal | ICD-10-CM

## 2013-11-21 DIAGNOSIS — M25673 Stiffness of unspecified ankle, not elsewhere classified: Secondary | ICD-10-CM

## 2013-11-21 DIAGNOSIS — R262 Difficulty in walking, not elsewhere classified: Secondary | ICD-10-CM

## 2013-11-21 NOTE — Progress Notes (Signed)
Physical Therapy Treatment Patient Details  Name: [name] MRN: [id number] Date of Birth: 1965/10/21  Today's Date: 11/21/2013 Time: 7416-3845 PT Time Calculation (min): 43 min Charge: TE 1345-1410, Manual 3646-8032  Visit#: 24 of 27  Re-eval: 12/03/13 Assessment Diagnosis: ataxia Prior Therapy: HH  Authorization: BCBS  Authorization Time Period:    Authorization Visit#:   of     Subjective: Symptoms/Limitations Symptoms: Pain Bil hands and feet 6/10.  Reports ambulating indoor without AD,  SPC for outdoors Pain Assessment Currently in Pain?: Yes Pain Score: 6  Pain Location: Hand Pain Orientation: Right;Left Multiple Pain Sites: Yes  Precautions/Restrictions  Precautions Precautions: Fall Precaution Comments: ambulates with SPC  Exercise/Treatments Ankle Exercises - Seated Towel Crunch: 5 reps;Limitations BAPS: Sitting;Level 3;10 reps Ankle Exercises - Supine T-Band: for dorsiflexion, inversion and eversion x 15 B Other Supine Ankle Exercises: prone PROM for dorsiflexion  Manual Therapy Manual Therapy: Other (comment) Other Manual Therapy: manual techniques, MFR to bilateral plantar surfaces of feet, PROM for dorsiflexion, ankle mobilizations, calf stretches  Physical Therapy Assessment and Plan PT Assessment and Plan Clinical Impression Statement: Pt required max therapist facilitation for proper musculature activaiton.  Pt encouraged to sit with knees at 90 degrees with heels on ground to reduce plantar flexion spasticity.   Manual techniques to reduce spasticity Bil LE.  No reports of pain through session.   PT Plan: Continue with strengthening of ankles, PROM and manual techniques to decrease tone and spasm.    Goals    Problem List Patient Active Problem List   Diagnosis Date Noted  . Stiffness of joint, not elsewhere classified, ankle and foot 11/12/2013  . Lack of coordination 11/03/2013  . Pernicious anemia 09/12/2013  . Hyperglycemia  09/12/2013  . Elevated transaminase level 09/12/2013  . Weakness 09/12/2013  . Ataxia 09/09/2013  . Difficulty in walking(719.7) 09/09/2013  . Weakness of both legs 09/09/2013  . Generalized weakness 02/11/2013  . Tobacco abuse 01/14/2013  . Bilateral leg edema 01/14/2013  . Pedal edema 01/13/2013  . Chronic pain syndrome 12/30/2012  . Reactive airway disease 12/30/2012  . Encephalopathy acute 07/25/2011  . 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

## 2013-11-21 NOTE — Progress Notes (Signed)
Occupational Therapy Treatment Patient Details  Name: [name] MRN: [id number] Date of Birth: Sep 26, 1966  Today's Date: 11/21/2013 Time: 2585-2778 OT Time Calculation (min): 45 min Paraffin 1430-1445 (15') Therexercises 1445-1515 (30')  Visit#: 6 of 16  Re-eval: 12/01/13    Authorization: Medicare   Authorization Time Period: before 10th visit  Authorization Visit#: 6 of 10  Subjective Symptoms/Limitations Symptoms: S:  It is coming along but I still have a ways to go (handwriting)                      Exercise/Treatments Wrist Exercises Other wrist exercises: velcro board with large rolling flexion/extension x 4 reps         Hand Exercises Small Pegboard: 25pegs with alternating hands; remove with tweezers x 12 each hand with increased difficulty with right hand this date Other Hand Exercises: removing pegs with resisted gripper x 25 each hand gripper on 35# no breaks.  Other Hand Exercises: resisted hand gripper upside down x 50 squeezes each hand at 35#      Modalities Modalities: Moist Heat Moist Heat Therapy Number Minutes Moist Heat: 15 Minutes Moist Heat Location: Hand (with paraffin) Splinting Splinting: Paraffin wax to bilateral hands x 15 mins this date for pain management and prep for fine motor tasks.   Occupational Therapy Assessment and Plan OT Assessment and Plan Clinical Impression Statement: A:  Patient with improved tremors bilateral hands.  Patient cont to report elevated pain in hands with functional use. Patient with reported decreased pain with use of paraffin treatment to 2/10  and easier grasp. following  OT Plan: P: clothing fasteners, fine motor, putty exercises, putty for home.    Goals Short Term Goals Short Term Goal 1: Patient will be educated  on HEP Short Term Goal 1 Progress: Progressing toward goal Short Term Goal 2: Patient will decrased 9hole peg score by at least 2 seconds with each hand to increase ability to pick up small  objects Short Term Goal 2 Progress: Progressing toward goal Short Term Goal 3: Patient will increase bilateral hand strength by at least 3 pounds to increase ability to hold smaller objects without dropping Short Term Goal 3 Progress: Progressing toward goal Short Term Goal 4: Patient will improve ability to hold pen in order to sign name legibly 4/4 trials  Short Term Goal 4 Progress: Progressing toward goal Short Term Goal 5: Patient will complete UE activity for at least 5 minutes to increase UE endurance for increased participation/independence in ADL activities.   Short Term Goal 5 Progress: Progressing toward goal Long Term Goals Long Term Goal 1: Patient will return to highest level of independence with all B/IADL and leisure activities Long Term Goal 1 Progress: Progressing toward goal Long Term Goal 2: Patient will improve 9 hole peg scores to less than 37 seconds with each to increase fine motor coordination for clothing fasteners, small objects Long Term Goal 2 Progress: Progressing toward goal Long Term Goal 3: Patient will improve bilateral hand strength >/= 50# each hand to increase independence to open containers Long Term Goal 3 Progress: Progressing toward goal Long Term Goal 4: Patient will demo ability to copy 5+ line paragraph with no need for rest break  with good legibility Long Term Goal 4 Progress: Progressing toward goal  Problem List Patient Active Problem List   Diagnosis Date Noted  . Stiffness of joint, not elsewhere classified, ankle and foot 11/12/2013  . Lack of coordination 11/03/2013  .  Pernicious anemia 09/12/2013  . Hyperglycemia 09/12/2013  . Elevated transaminase level 09/12/2013  . Weakness 09/12/2013  . Ataxia 09/09/2013  . Difficulty in walking(719.7) 09/09/2013  . Weakness of both legs 09/09/2013  . Generalized weakness 02/11/2013  . Tobacco abuse 01/14/2013  . Bilateral leg edema 01/14/2013  . Pedal edema 01/13/2013  . Chronic pain syndrome  12/30/2012  . Reactive airway disease 12/30/2012  . Encephalopathy acute 07/25/2011  . 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

## 2013-11-24 ENCOUNTER — Ambulatory Visit (HOSPITAL_COMMUNITY)
Admission: RE | Admit: 2013-11-24 | Discharge: 2013-11-24 | Disposition: A | Discharge status: Home / Self Care | Payer: BC Managed Care – PPO | Source: Ambulatory Visit | Attending: Family Medicine | Admitting: Family Medicine

## 2013-11-24 DIAGNOSIS — R269 Unspecified abnormalities of gait and mobility: Secondary | ICD-10-CM | POA: Diagnosis not present

## 2013-11-24 DIAGNOSIS — R279 Unspecified lack of coordination: Secondary | ICD-10-CM | POA: Diagnosis not present

## 2013-11-24 DIAGNOSIS — Z9181 History of falling: Secondary | ICD-10-CM | POA: Diagnosis not present

## 2013-11-24 DIAGNOSIS — J449 Chronic obstructive pulmonary disease, unspecified: Secondary | ICD-10-CM | POA: Diagnosis not present

## 2013-11-24 DIAGNOSIS — IMO0001 Reserved for inherently not codable concepts without codable children: Secondary | ICD-10-CM | POA: Diagnosis not present

## 2013-11-24 DIAGNOSIS — R29898 Other symptoms and signs involving the musculoskeletal system: Secondary | ICD-10-CM | POA: Diagnosis not present

## 2013-11-24 NOTE — Progress Notes (Signed)
Physical Therapy Treatment Patient Details  Name: [name] MRN: [id number] Date of Birth: 02-Jun-1966  Today's Date: 11/24/2013 Time: 4098-1191 PT Time Calculation (min): 42 min Charges: Therex x 47'(8295-6213) Manual x 08'(6578-4696)  Visit#: 25 of 27  Re-eval: 12/03/13  Authorization: BCBS   Subjective: Symptoms/Limitations Symptoms: Pt states that her hands and feet are really hurting to day. Pain Assessment Currently in Pain?: Yes Pain Score: 8  Pain Location:  (Bilateral hands and feet)   Exercise/Treatments Ankle Stretches Slant Board Stretch: 2 reps;30 seconds Ankle Exercises - Seated Toe Raise: 10 reps BAPS: Sitting;10 reps;Level 4 Ankle Exercises - Supine Other Supine Ankle Exercises: prone PROM for dorsiflexion Standing Heel Raises: 10 reps;Limitations Heel Raises Limitations: without UE assistance Other Standing Exercises: Rocker board x 2' A/P R/L   Manual Therapy Manual Therapy: Other (comment) Other Manual Therapy: MFR to bilateral plantar surfaces of feet, PROM for dorsiflexion, ankle mobilizations.  Physical Therapy Assessment and Plan PT Assessment and Plan Clinical Impression Statement: Pt continues to progress well. Pt requires multimodal cueing to improve control with seated BAPS board. Pt displays improve active plantar flexion. Severe supination and calcaneal verus noted in bilateral feet. Advised pt to speak with MD at next appointment regarding need for orthotics. PT Plan: Continue with strengthening of ankles, PROM and manual techniques to decrease tone and spasm.     Problem List Patient Active Problem List   Diagnosis Date Noted  . Stiffness of joint, not elsewhere classified, ankle and foot 11/12/2013  . Lack of coordination 11/03/2013  . Pernicious anemia 09/12/2013  . Hyperglycemia 09/12/2013  . Elevated transaminase level 09/12/2013  . Weakness 09/12/2013  . Ataxia 09/09/2013  . Difficulty in walking(719.7) 09/09/2013  .  Weakness of both legs 09/09/2013  . Generalized weakness 02/11/2013  . Tobacco abuse 01/14/2013  . Bilateral leg edema 01/14/2013  . Pedal edema 01/13/2013  . Chronic pain syndrome 12/30/2012  . Reactive airway disease 12/30/2012  . Encephalopathy acute 07/25/2011  . 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

## 2013-11-26 ENCOUNTER — Ambulatory Visit (HOSPITAL_COMMUNITY)
Admission: RE | Admit: 2013-11-26 | Discharge: 2013-11-26 | Disposition: A | Discharge status: Home / Self Care | Payer: BC Managed Care – PPO | Source: Ambulatory Visit | Attending: Family Medicine | Admitting: Family Medicine

## 2013-11-26 DIAGNOSIS — R928 Other abnormal and inconclusive findings on diagnostic imaging of breast: Secondary | ICD-10-CM | POA: Insufficient documentation

## 2013-11-26 DIAGNOSIS — R29898 Other symptoms and signs involving the musculoskeletal system: Secondary | ICD-10-CM | POA: Diagnosis not present

## 2013-11-26 DIAGNOSIS — R279 Unspecified lack of coordination: Secondary | ICD-10-CM | POA: Diagnosis not present

## 2013-11-26 DIAGNOSIS — IMO0001 Reserved for inherently not codable concepts without codable children: Secondary | ICD-10-CM | POA: Diagnosis not present

## 2013-11-26 DIAGNOSIS — R921 Mammographic calcification found on diagnostic imaging of breast: Secondary | ICD-10-CM

## 2013-11-26 DIAGNOSIS — R269 Unspecified abnormalities of gait and mobility: Secondary | ICD-10-CM | POA: Diagnosis not present

## 2013-11-26 DIAGNOSIS — Z9181 History of falling: Secondary | ICD-10-CM | POA: Diagnosis not present

## 2013-11-26 DIAGNOSIS — J449 Chronic obstructive pulmonary disease, unspecified: Secondary | ICD-10-CM | POA: Diagnosis not present

## 2013-11-26 NOTE — Progress Notes (Signed)
Physical Therapy Treatment Patient Details  Name: [name] MRN: [id number] Date of Birth: [birth year]  Today's Date: 11/26/2013 Time: 1345-1430 PT Time Calculation (min): 45 min Visit#: 26 of 27  Charges:  [place] 16', TE 8', manual 18'  Subjective: Symptoms/Limitations Symptoms: Pt states the plantar surfaces of both feet are hurting today, 8/10. Pain Assessment Currently in Pain?: Yes Pain Score: 8    Exercise/Treatments Aerobic Exercises Stationary Bike: nustep LE's only 8' at level 4, hills #3, seat 4     Modalities Modalities: Ultrasound Manual Therapy Manual Therapy: Other (comment) Other Manual Therapy: MFR and PROM to bilateral plantar surfaces of feet, PROM for Dorsiflexion and ankle mobilizations following [place] Ultrasound Ultrasound Location: bilateral arches and achilles 4' each (16' total) Ultrasound Parameters: 1.2 w/cm2 continuous with 3 MHz Ultrasound Goals: Pain  Physical Therapy Assessment and Plan Assessment:  Focused session today on decreasing pain.  Pt with noted increased antalgia due to plantar fascia pain/tightness.  Added [place] prior to manual techniques with good results.  Overall pain decreased to 5/10 at end of session with improved gait pattern. Plan:  Continue to decrease pain and increase ROM with manual techniques/manual.  Re-eval next visit; Suggest MD order orthotics consult and night splints.  Problem List Patient Active Problem List   Diagnosis Date Noted  . Stiffness of joint, not elsewhere classified, ankle and foot 11/12/2013  . Lack of coordination 11/03/2013  . Pernicious anemia 09/12/2013  . Hyperglycemia 09/12/2013  . Elevated transaminase level 09/12/2013  . Weakness 09/12/2013  . Ataxia 09/09/2013  . Difficulty in walking(719.7) 09/09/2013  . Weakness of both legs 09/09/2013  . Generalized weakness 02/11/2013  . Tobacco abuse 01/14/2013  . Bilateral leg edema 01/14/2013  . Pedal edema 01/13/2013  . Chronic pain syndrome  12/30/2012  . Reactive airway disease 12/30/2012  . Encephalopathy acute 07/25/2011  . 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

## 2013-11-28 ENCOUNTER — Inpatient Hospital Stay (HOSPITAL_COMMUNITY)
Admission: RE | Admit: 2013-11-28 | Discharge: 2013-11-28 | Disposition: A | Discharge status: Home / Self Care | Payer: BC Managed Care – PPO | Source: Ambulatory Visit | Attending: Physical Therapy | Admitting: Physical Therapy

## 2013-11-28 ENCOUNTER — Telehealth (HOSPITAL_COMMUNITY): Payer: Self-pay

## 2013-11-28 DIAGNOSIS — M25673 Stiffness of unspecified ankle, not elsewhere classified: Secondary | ICD-10-CM

## 2013-11-28 DIAGNOSIS — R270 Ataxia, unspecified: Secondary | ICD-10-CM

## 2013-11-28 DIAGNOSIS — R29898 Other symptoms and signs involving the musculoskeletal system: Secondary | ICD-10-CM

## 2013-11-28 DIAGNOSIS — R262 Difficulty in walking, not elsewhere classified: Secondary | ICD-10-CM

## 2013-11-28 DIAGNOSIS — G609 Hereditary and idiopathic neuropathy, unspecified: Secondary | ICD-10-CM | POA: Diagnosis not present

## 2013-11-28 DIAGNOSIS — M25676 Stiffness of unspecified foot, not elsewhere classified: Principal | ICD-10-CM

## 2013-12-01 ENCOUNTER — Ambulatory Visit (HOSPITAL_COMMUNITY)
Admission: RE | Admit: 2013-12-01 | Discharge: 2013-12-01 | Disposition: A | Discharge status: Home / Self Care | Payer: BC Managed Care – PPO | Source: Ambulatory Visit | Attending: Family Medicine | Admitting: Family Medicine

## 2013-12-01 DIAGNOSIS — Z9181 History of falling: Secondary | ICD-10-CM | POA: Diagnosis not present

## 2013-12-01 DIAGNOSIS — M25673 Stiffness of unspecified ankle, not elsewhere classified: Secondary | ICD-10-CM

## 2013-12-01 DIAGNOSIS — R269 Unspecified abnormalities of gait and mobility: Secondary | ICD-10-CM | POA: Diagnosis not present

## 2013-12-01 DIAGNOSIS — R29898 Other symptoms and signs involving the musculoskeletal system: Secondary | ICD-10-CM | POA: Diagnosis not present

## 2013-12-01 DIAGNOSIS — M25676 Stiffness of unspecified foot, not elsewhere classified: Principal | ICD-10-CM

## 2013-12-01 DIAGNOSIS — R262 Difficulty in walking, not elsewhere classified: Secondary | ICD-10-CM

## 2013-12-01 DIAGNOSIS — J449 Chronic obstructive pulmonary disease, unspecified: Secondary | ICD-10-CM | POA: Diagnosis not present

## 2013-12-01 DIAGNOSIS — R270 Ataxia, unspecified: Secondary | ICD-10-CM

## 2013-12-01 DIAGNOSIS — IMO0001 Reserved for inherently not codable concepts without codable children: Secondary | ICD-10-CM | POA: Diagnosis not present

## 2013-12-01 DIAGNOSIS — R279 Unspecified lack of coordination: Secondary | ICD-10-CM | POA: Diagnosis not present

## 2013-12-01 NOTE — Evaluation (Signed)
Physical Therapy Evaluation  Patient Details  Name: [name] MRN: [id number] Date of Birth: 04-20-1966  Today's Date: 12/01/2013 Time: 1350-1430 PT Time Calculation (min): 40 min Charge:  Mm test 0932-6712; physical performance [phone]; self care [phone]             Visit#: 27 of    Re-eval: 12/31/13 Assessment Diagnosis: ataxia Prior Therapy: HH  Authorization: BCBS     Past Medical History:  Past Medical History  Diagnosis Date  . Anxiety   . Depression   . COPD (chronic obstructive pulmonary disease)   . MS (multiple sclerosis)   . Fibromyalgia   . Impaired fasting glucose   . 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 Eating disorder   . Chronic low back pain   . PONV (postoperative nausea and vomiting)   . Chronic bronchitis     "yearly" (02/11/2013)  . Borderline diabetes   . GERD (gastroesophageal reflux disease)   . Migraine     "I use to" (02/11/2013)  . Arthritis     "hands & feet" (02/11/2013)  . Peripheral neuropathy     [name] 02/11/2013  . B12 deficiency anemia     [name] 02/11/2013  . Chronic pain syndrome     [name] 02/11/2013  . UTI (lower urinary tract infection) 02/11/2013    [name] 02/11/2013  . Chronic edema     BLE/notes 02/11/2013   Past Surgical History:  Past Surgical History  Procedure Laterality Date  . Abdominal hysterectomy  ~ 1987; ~ 2004    "[place]; f[place]" (02/11/2013)  . Cesarean section  9767; 1984; 1986  . Anterior cervical decomp/discectomy fusion  2009; 2011  . Tonsillectomy  1990's?  . Cataract extraction w/phaco  06/20/2011    Procedure: CATARACT EXTRACTION PHACO AND INTRAOCULAR LENS PLACEMENT (IOC);  Surgeon: [name] T. Gershon Crane;  Location: [facility];  Service: Ophthalmology;  Laterality: Left;  CDE 1.81  . Cataract extraction w/phaco  07/04/2011    Procedure: CATARACT EXTRACTION PHACO AND INTRAOCULAR LENS PLACEMENT (IOC);  Surgeon: [name] T. Gershon Crane;  Location: [facility];  Service: Ophthalmology;  Laterality:  Right;  CDE: 1.76  . Yag laser application Right 3/41/9379    Procedure: YAG LASER APPLICATION;  Surgeon: [name] T. Gershon Crane, MD;  Location: [facility];  Service: Ophthalmology;  Laterality: Right;  . Yag laser application Left 0/24/0973    Procedure: YAG LASER APPLICATION;  Surgeon: [name] T. Gershon Crane, MD;  Location: [facility];  Service: Ophthalmology;  Laterality: Left;  . Appendectomy      Subjective Symptoms/Limitations Symptoms: Pt states that getting started is the most difficult thing for her. Pain Assessment Currently in Pain?: Yes Pain Score: 7  Pain Location: Toe (Comment which one) Pain Orientation: Right;Left  Precautions/Restrictions  Precautions Precautions: Fall  Cognition/Observation  Pt has significant  Supination with weightbearing  Assessment RLE AROM (degrees) Right Ankle Dorsiflexion: -12 (was -18 on 11/12/13) Right Ankle Plantar Flexion: 60 (was 50) Right Ankle Inversion: 23 (was 20) Right Ankle Eversion: 12 (was 12) RLE Strength Right Hip Flexion: 5/5 Right Hip Extension: 5/5 Right Hip ABduction: 5/5 Right Knee Flexion: 5/5 Right Knee Extension: 5/5 Right Ankle Dorsiflexion: 5/5 (was 3/5) Right Ankle Plantar Flexion: 4/5 (was 4/5) Right Ankle Inversion: 5/5 (was 5-) Right Ankle Eversion: 5/5 (was 4-/5) LLE AROM (degrees) Left Ankle Dorsiflexion: -12 (was -25) Left Ankle Plantar Flexion: 60 (was 50) Left Ankle Inversion: 26 (was 22) Left Ankle Eversion: 12 (was 15) LLE Strength Left Ankle Dorsiflexion:  (  4+/5 in available range was 3/5) Left Ankle Plantar Flexion: 4/5 (was 4/5) Left Ankle Inversion: 5/5 (was 4+) Left Ankle Eversion: 5/5 (was 4-)  Exercise/Treatments Mobility/Balance  Berg Balance Test Sit to Stand: Able to stand without using hands and stabilize independently Standing Unsupported: Able to stand safely 2 minutes Sitting with Back Unsupported but Feet Supported on Floor or Stool: Able to sit safely and securely 2 minutes Stand to Sit:  Sits safely with minimal use of hands Transfers: Able to transfer safely, minor use of hands Standing Unsupported with Eyes Closed: Able to stand 10 seconds with supervision Standing Ubsupported with Feet Together: Able to place feet together independently and stand for 1 minute with supervision From Standing, Reach Forward with Outstretched Arm: Can reach confidently >25 cm (10") From Standing Position, Pick up Object from Floor: Able to pick up shoe safely and easily From Standing Position, Turn to Look Behind Over each Shoulder: Looks behind from both sides and weight shifts well Turn 360 Degrees: Able to turn 360 degrees safely one side only in 4 seconds or less Standing Unsupported, Alternately Place Feet on Step/Stool: Able to stand independently and safely and complete 8 steps in 20 seconds Standing Unsupported, One Foot in Front: Able to plae foot ahead of the other independently and hold 30 seconds Standing on One Leg: Tries to lift leg/unable to hold 3 seconds but remains standing independently Total Score: 49 was 49  PROM to B ankle for dorsiflexion.    Physical Therapy Assessment and Plan PT Assessment and Plan Clinical Impression Statement: Pt has improved in her ROM and strength in her ankles with deficits being primarily dorsiflexion ROM at this point as well plantar fascial pain.    PT Frequency: Min 2X/week PT Duration: 4 weeks (for a total of 16 weeks) PT Treatment/Interventions: Manual techniques;Modalities;Therapeutic activities PT Plan: sent order for dynasplints for B dorsiflexion; continue with PROM, manual and modalities to decrease plantar spasm and balance activites as able.    Goals Home Exercise Program PT Goal: Perform Home Exercise Program - Progress: Met PT Short Term Goals PT Short Term Goal 1 - Progress: Met PT Short Term Goal 2 - Progress: Met PT Short Term Goal 3 - Progress: Met PT Long Term Goals PT Long Term Goal 1 - Progress: Met PT Long Term  Goal 2 - Progress: Met Long Term Goal 3 Progress: Met Long Term Goal 4 Progress: Met Additional PT Long Term Goals?: Yes PT Long Term Goal 6: New goal as of 11/12/2013- Pt dorsiflexion B to neutral for a more normalized gt.- progressing PT Long Term Goal 7: decrease toe and plantar pain to no greater than a 5/10   Problem List Patient Active Problem List   Diagnosis Date Noted  . Stiffness of joint, not elsewhere classified, ankle and foot 11/12/2013  . Lack of coordination 11/03/2013  . Pernicious anemia 09/12/2013  . Hyperglycemia 09/12/2013  . Elevated transaminase level 09/12/2013  . Weakness 09/12/2013  . Ataxia 09/09/2013  . Difficulty in walking(719.7) 09/09/2013  . Weakness of both legs 09/09/2013  . Generalized weakness 02/11/2013  . Tobacco abuse 01/14/2013  . Bilateral leg edema 01/14/2013  . Pedal edema 01/13/2013  . Chronic pain syndrome 12/30/2012  . Reactive airway disease 12/30/2012  . Encephalopathy acute 07/25/2011  . 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       GP    Lorra Freeman,CINDY 12/01/2013, 3:26 PM  Physician Documentation Your signature is required to indicate approval of the treatment plan as stated above.  Please sign and either send electronically or make a copy of this report for your files and return this physician signed original.   Please mark one 1.__approve of plan  2. ___approve of plan with the following conditions.   ______________________________                                                          _____________________ Physician Signature                                                                                                             Date

## 2013-12-03 ENCOUNTER — Ambulatory Visit (HOSPITAL_COMMUNITY)
Admission: RE | Admit: 2013-12-03 | Discharge: 2013-12-03 | Disposition: A | Discharge status: Home / Self Care | Payer: BC Managed Care – PPO | Source: Ambulatory Visit | Attending: Family Medicine | Admitting: Family Medicine

## 2013-12-03 DIAGNOSIS — R269 Unspecified abnormalities of gait and mobility: Secondary | ICD-10-CM | POA: Diagnosis not present

## 2013-12-03 DIAGNOSIS — J449 Chronic obstructive pulmonary disease, unspecified: Secondary | ICD-10-CM | POA: Diagnosis not present

## 2013-12-03 DIAGNOSIS — R279 Unspecified lack of coordination: Secondary | ICD-10-CM | POA: Diagnosis not present

## 2013-12-03 DIAGNOSIS — IMO0001 Reserved for inherently not codable concepts without codable children: Secondary | ICD-10-CM | POA: Diagnosis not present

## 2013-12-03 DIAGNOSIS — R29898 Other symptoms and signs involving the musculoskeletal system: Secondary | ICD-10-CM | POA: Diagnosis not present

## 2013-12-03 DIAGNOSIS — Z9181 History of falling: Secondary | ICD-10-CM | POA: Diagnosis not present

## 2013-12-03 NOTE — Evaluation (Signed)
Occupational Therapy Re-Evaluation and Discharge  Patient Details  Name: [name] MRN: [id number] Date of Birth: 11/10/1965  Today's Date: 12/03/2013 Time: 1245-8099 OT Time Calculation (min): 49 min Paraffin 1345-1400 (15') Muscle Testing 1400-1420 (78') Therapeutic Activities 8338-2505 (65')  Visit#: 7 of 16  Re-eval:    Assessment Diagnosis: hand weakness Next MD Visit: 12/09/2013 Prior Therapy: acute, SNF and currently on PT services   Authorization: Medicare   Authorization Time Period: before 10th visit  Authorization Visit#: 7 of 10   Past Medical History:  Past Medical History  Diagnosis Date  . Anxiety   . Depression   . COPD (chronic obstructive pulmonary disease)   . MS (multiple sclerosis)   . Fibromyalgia   . Impaired fasting glucose   . 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 Eating disorder   . Chronic low back pain   . PONV (postoperative nausea and vomiting)   . Chronic bronchitis     "yearly" (02/11/2013)  . Borderline diabetes   . GERD (gastroesophageal reflux disease)   . Migraine     "I use to" (02/11/2013)  . Arthritis     "hands & feet" (02/11/2013)  . Peripheral neuropathy     [name] 02/11/2013  . B12 deficiency anemia     [name] 02/11/2013  . Chronic pain syndrome     [name] 02/11/2013  . UTI (lower urinary tract infection) 02/11/2013    [name] 02/11/2013  . Chronic edema     BLE/notes 02/11/2013   Past Surgical History:  Past Surgical History  Procedure Laterality Date  . Abdominal hysterectomy  ~ 1987; ~ 2004    "[place]; [place]" (02/11/2013)  . Cesarean section  3976; 1984; 1986  . Anterior cervical decomp/discectomy fusion  2009; 2011  . Tonsillectomy  1990's?  . Cataract extraction w/phaco  06/20/2011    Procedure: CATARACT EXTRACTION PHACO AND INTRAOCULAR LENS PLACEMENT (IOC);  Surgeon: [name] T. Gershon Crane;  Location: [facility];  Service: Ophthalmology;  Laterality: Left;  CDE 1.81  . Cataract extraction  w/phaco  07/04/2011    Procedure: CATARACT EXTRACTION PHACO AND INTRAOCULAR LENS PLACEMENT (IOC);  Surgeon: [name] T. Gershon Crane;  Location: [facility];  Service: Ophthalmology;  Laterality: Right;  CDE: 1.76  . Yag laser application Right 7/34/1937    Procedure: YAG LASER APPLICATION;  Surgeon: [name] T. Gershon Crane, MD;  Location: [facility];  Service: Ophthalmology;  Laterality: Right;  . Yag laser application Left 06/10/4096    Procedure: YAG LASER APPLICATION;  Surgeon: [name] T. Gershon Crane, MD;  Location: [facility];  Service: Ophthalmology;  Laterality: Left;  . Appendectomy      Subjective Symptoms/Limitations Symptoms: S: "My hands don't really hurt me as bad as they used to. And I tied my shoes for the first time Saturday." Limitations: fall  Pain Assessment Currently in Pain?: Yes Pain Score: 5  Pain Location: Foot Pain Orientation: Right;Left Pain Type: Chronic pain  Precautions/Restrictions  Precautions Precautions: Fall Precaution Comments: ambulates with SPC  Balance Screening Balance Screen Has the patient fallen in the past 6 months: Yes How many times?: 3 Has the patient had a decrease in activity level because of a fear of falling? : Yes Is the patient reluctant to leave their home because of a fear of falling? 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 Improvements in writing, turning book pages, and general holding on to things. Pt still cannot button pants. Dominant Hand: Right Vision -  History Baseline Vision: Wears glasses only for reading Vision - Assessment Eye Alignment: Within Functional Limits  Cognition/Observation Cognition Overall Cognitive Status: Within Functional Limits for tasks assessed Arousal/Alertness: Awake/alert Orientation Level: Oriented X4 Observation/Other Assessments Other Assessments: Pt states propriocetion in bilaterateral hands has improved  Sensation/Coordination/Edema Sensation Light Touch: Appears Intact Proprioception: Appears Intact Coordination 9 Hole Peg Test: right 31.75 (no drops); left 31.29 (1 drop) (right 42.04 (1 drop); left 40.83 (no drops))  Additional Assessments RUE AROM (degrees) Right Composite Finger Extension:  (Full extension, no delay) Right Composite Finger Flexion:  (full flexion, no delay) Right Thumb Opposition: Digit 2;Digit 3;Digit 4;Digit 5 (no delay) RUE Strength Grip (lbs): 58 (45) Lateral Pinch: 17 lbs (18) 3 Point Pinch: 18 lbs (15) LUE AROM (degrees) Left Composite Finger Extension:  (full extension, no delay) Left Composite Finger Flexion:  (full flexion, no delay) Left Thumb Opposition: Digit 2;Digit 3;Digit 4;Digit 5 (no delay) LUE Strength Grip (lbs): 50 (41) Lateral Pinch: 15 lbs (12) 3 Point Pinch: 18 lbs (14) Right Hand Strength - Pinch (lbs) Lateral Pinch: 17 lbs (18) 3 Point Pinch: 18 lbs (15) Left Hand Strength - Pinch (lbs) Lateral Pinch: 15 lbs (12) 3 Point Pinch: 18 lbs (14)   Exercise/Treatments Hand Exercises Other Hand Exercises: Pt engaged in fine motor activity, 'connect four' game, for 5 minutes with no signs of fatigue or need for rest. Other Hand Exercises: Pt copied 6 line paragraph with good legibility and required no rest breaks. Pt indicates her handwriting is at baseline.     Moist Heat Therapy Number Minutes Moist Heat: 15 Minutes Moist Heat Location: Hand (Paraffin to bilateral hands) Splinting Splinting: Paraffin wax to bilateral hands x 15 mins this date  for pain management and prep for fine motor tasks.   Occupational Therapy Assessment and Plan OT Assessment and Plan Clinical Impression Statement: A: Pt has significantly overall improved functional use of bilateral hands. Pt feels she is close to baselines with her strength and handwriting abilities, and has a paraffin bath at home that she can continue to use as needed. Pt has met 5/5 STG, 3/4 LTG, and partially met 1 LTG (pt has not entirely attained highest level of functioning in that it is still difficult for her to button her pants - pt feels that this skill will be reacquired with time and continued HEP). Pt is discharged from OT services. OT Plan: P: Discharge   Goals Short Term Goals Short Term Goal 1: Patient will be educated  on HEP Short Term Goal 1 Progress: Met Short Term Goal 2: Patient will decrased 9hole peg score by at least 2 seconds with each hand to increase ability to pick up small objects Short Term Goal 2 Progress: Met Short Term Goal 3: Patient will increase bilateral hand strength by at least 3 pounds to increase ability to hold smaller objects without dropping Short Term Goal 3 Progress: Met Short Term Goal 4: Patient will improve ability to hold pen in order to sign name legibly 4/4 trials  Short Term Goal 4 Progress: Met Short Term Goal 5: Patient will complete UE activity for at least  5 minutes to increase UE endurance for increased participation/independence in ADL activities.   Short Term Goal 5 Progress: Met Long Term Goals Long Term Goal 1: Patient will return to highest level of independence with all B/IADL and leisure activities Long Term Goal 1 Progress: Partly met Long Term Goal 2: Patient will improve 9 hole peg scores to less than 37 seconds with each to increase fine motor coordination for clothing fasteners, small objects Long Term Goal 2 Progress: Met Long Term Goal 3: Patient will improve bilateral hand strength >/= 50# each hand to increase  independence to open containers Long Term Goal 3 Progress: Met Long Term Goal 4: Patient will demo ability to copy 5+ line paragraph with no need for rest break  with good legibility Long Term Goal 4 Progress: Met  Problem List Patient Active Problem List   Diagnosis Date Noted  . Stiffness of joint, not elsewhere classified, ankle and foot 11/12/2013  . Lack of coordination 11/03/2013  . Pernicious anemia 09/12/2013  . Hyperglycemia 09/12/2013  . Elevated transaminase level 09/12/2013  . Weakness 09/12/2013  . Ataxia 09/09/2013  . Difficulty in walking(719.7) 09/09/2013  . Weakness of both legs 09/09/2013  . Generalized weakness 02/11/2013  . Tobacco abuse 01/14/2013  . Bilateral leg edema 01/14/2013  . Pedal edema 01/13/2013  . Chronic pain syndrome 12/30/2012  . Reactive airway disease 12/30/2012  . Encephalopathy acute 07/25/2011  . 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    End of Session Activity Tolerance: Patient tolerated treatment well General Behavior During Therapy: WFL for tasks assessed/performed  GO Functional Assessment Tool Used: 9 Hole Peg Test:    Previous: right 42.05, one drop; left 40.83, no drops   Current: right 31.75 (no drops); left 31.29 (1 drop) Functional Limitation: Carrying, moving and handling objects Carrying, Moving and Handling Objects Current Status ([id number]): At least 1 percent but less than 20 percent impaired, limited or restricted Carrying, Moving and Handling Objects Goal Status [phone]): At least 1 percent but less than 20 percent impaired, limited or restricted Carrying, Moving and Handling Objects Discharge Status [phone]): At least 1 percent but less than 20 percent impaired, limited or restricted  Bea Graff, Golinda, OTR/L [phone]  12/03/2013, 4:57 PM  Physician Documentation Your signature is required to indicate approval of the treatment plan as stated above.  Please sign and either send electronically or make a copy of this report  for your files and return this physician signed original.  Please mark one 1.__approve of plan  2. ___approve of plan with the following conditions.   ______________________________                                                          _____________________ Physician Signature  Date  

## 2013-12-03 NOTE — Progress Notes (Signed)
Physical Therapy Treatment Patient Details  Name: [name] MRN: [id number] Date of Birth: May 27, 1966  Today's Date: 12/03/2013 Time: 8916-9450 PT Time Calculation (min): 40 min Charges: Therex x 38'(8828-0034) NMR x 91'(7915-0569) Manual x 79'(4801-6553)  Visit#: 28 of 35  Re-eval: 12/31/13  Authorization: BCBS   Subjective: Symptoms/Limitations Symptoms: Pt states that her hands and feet are feeling better. Pain Assessment Currently in Pain?: Yes Pain Score: 5  Pain Location: Foot Pain Orientation: Right;Left Pain Type: Chronic pain  Precautions/Restrictions  Precautions Precautions: Fall Precaution Comments: ambulates with SPC  Exercise/Treatments Ankle Stretches Slant Board Stretch: 2 reps;30 seconds Standing SLS: 3 reps;20 secs;Intermittent HHA Tandem Gait: 2 reps Heel Raises: 10 reps;Limitations Heel Raises Limitations: without UE assistance Toe Raise: 10 reps Other Standing Exercises: Rocker board x 2' A/P R/L   Physical Therapy Assessment and Plan PT Assessment and Plan Clinical Impression Statement: Therapist facilitated activities to improve ankle stability/ROM and balance. Pt displays improved active dorsiflexion with gait. Manual techniques competed to bilateral calf musculature and plantar fascia to decrease fascial restrictions. Pt reports decreased pain at end of session. PT Frequency: Min 2X/week PT Duration: 4 weeks (for a total of 16 weeks) PT Treatment/Interventions: Manual techniques;Modalities;Therapeutic activities PT Plan: Continue with PROM, manual and modalities to decrease plantar spasm and balance activities as able. Follow up regarding dynamic splint order.    Problem List Patient Active Problem List   Diagnosis Date Noted  . Stiffness of joint, not elsewhere classified, ankle and foot 11/12/2013  . Lack of coordination 11/03/2013  . Pernicious anemia 09/12/2013  . Hyperglycemia 09/12/2013  . Elevated transaminase level 09/12/2013   . Weakness 09/12/2013  . Ataxia 09/09/2013  . Difficulty in walking(719.7) 09/09/2013  . Weakness of both legs 09/09/2013  . Generalized weakness 02/11/2013  . Tobacco abuse 01/14/2013  . Bilateral leg edema 01/14/2013  . Pedal edema 01/13/2013  . Chronic pain syndrome 12/30/2012  . Reactive airway disease 12/30/2012  . Encephalopathy acute 07/25/2011  . 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

## 2013-12-05 ENCOUNTER — Ambulatory Visit (HOSPITAL_COMMUNITY): Payer: BC Managed Care – PPO | Admitting: Physical Therapy

## 2013-12-08 ENCOUNTER — Other Ambulatory Visit: Payer: Self-pay | Admitting: Family Medicine

## 2013-12-08 ENCOUNTER — Ambulatory Visit (HOSPITAL_COMMUNITY): Payer: BC Managed Care – PPO

## 2013-12-08 ENCOUNTER — Ambulatory Visit (HOSPITAL_COMMUNITY)
Admission: RE | Admit: 2013-12-08 | Discharge: 2013-12-08 | Disposition: A | Discharge status: Home / Self Care | Payer: BC Managed Care – PPO | Source: Ambulatory Visit | Attending: Family Medicine | Admitting: Family Medicine

## 2013-12-08 DIAGNOSIS — R270 Ataxia, unspecified: Secondary | ICD-10-CM

## 2013-12-08 DIAGNOSIS — R262 Difficulty in walking, not elsewhere classified: Secondary | ICD-10-CM

## 2013-12-08 DIAGNOSIS — J4489 Other specified chronic obstructive pulmonary disease: Secondary | ICD-10-CM | POA: Insufficient documentation

## 2013-12-08 DIAGNOSIS — J449 Chronic obstructive pulmonary disease, unspecified: Secondary | ICD-10-CM | POA: Insufficient documentation

## 2013-12-08 DIAGNOSIS — R29898 Other symptoms and signs involving the musculoskeletal system: Secondary | ICD-10-CM | POA: Insufficient documentation

## 2013-12-08 DIAGNOSIS — M25673 Stiffness of unspecified ankle, not elsewhere classified: Secondary | ICD-10-CM

## 2013-12-08 DIAGNOSIS — R279 Unspecified lack of coordination: Secondary | ICD-10-CM | POA: Diagnosis not present

## 2013-12-08 DIAGNOSIS — R269 Unspecified abnormalities of gait and mobility: Secondary | ICD-10-CM | POA: Diagnosis not present

## 2013-12-08 DIAGNOSIS — Z9181 History of falling: Secondary | ICD-10-CM | POA: Insufficient documentation

## 2013-12-08 DIAGNOSIS — IMO0001 Reserved for inherently not codable concepts without codable children: Secondary | ICD-10-CM | POA: Diagnosis not present

## 2013-12-08 DIAGNOSIS — M25676 Stiffness of unspecified foot, not elsewhere classified: Secondary | ICD-10-CM

## 2013-12-08 NOTE — Progress Notes (Signed)
Physical Therapy Treatment Patient Details  Name: [name] MRN: [id number] Date of Birth: May 02, 1966  Today's Date: 12/08/2013 Time: 1350-1430 PT Time Calculation (min): 40 min Charge TE 8502-7741, Manual 1412-1430  Visit#: 29 of 72  Re-eval: 12/31/13 Assessment Diagnosis: ataxia Prior Therapy: HH  Authorization: BCBS  Authorization Time Period:    Authorization Visit#:   of     Subjective: Symptoms/Limitations Symptoms:  Bil foot pain continues to be 6/10.  Got referral for Bil antkle dorsiflexion dynasplints. Pain Assessment Currently in Pain?: Yes Pain Score: 6  Pain Location: Foot Pain Orientation: Right;Left  Precautions/Restrictions  Precautions Precautions: Fall Precaution Comments: ambulates with SPC  Exercise/Treatments Ankle Stretches Slant Board Stretch: 3 reps;30 seconds Balance Exercises Standing SLS: 3 reps;20 secs;Intermittent HHA Balance Beam: Forward and sidestepping 2RT Tandem Gait: 1 rep Heel Raises: 10 reps;Limitations Heel Raises Limitations: without UE assistance Toe Raise: 10 reps Other Standing Exercises: Rocker board x 2' A/P R/L intermittent HHA      Manual Therapy Manual Therapy: Other (comment) Other Manual Therapy: MFR and PROM to bilateral plantar surfaces of feet, PROM for Dorsiflexion and ankle mobilizations  Physical Therapy Assessment and Plan PT Assessment and Plan Clinical Impression Statement: Therapist facilitation activities to improve ankle stability on static and dynamic surfaces to improve pt.'s stride length and SLS time during gait.  Manual techniques complete to bilateral calf musculature and plantar fascia to reduce fascial restrictions and improve AAROM.  Referral received for dorsiflexion dynasplint, information sent to representative PT Plan: Continue with PROM, manual and modalities to decrease plantar spasm and balance activities as able. Follow up regarding dynamic splint order.    Goals    Problem  List Patient Active Problem List   Diagnosis Date Noted  . Stiffness of joint, not elsewhere classified, ankle and foot 11/12/2013  . Lack of coordination 11/03/2013  . Pernicious anemia 09/12/2013  . Hyperglycemia 09/12/2013  . Elevated transaminase level 09/12/2013  . Weakness 09/12/2013  . Ataxia 09/09/2013  . Difficulty in walking(719.7) 09/09/2013  . Weakness of both legs 09/09/2013  . Generalized weakness 02/11/2013  . Tobacco abuse 01/14/2013  . Bilateral leg edema 01/14/2013  . Pedal edema 01/13/2013  . Chronic pain syndrome 12/30/2012  . Reactive airway disease 12/30/2012  . Encephalopathy acute 07/25/2011  . 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

## 2013-12-09 LAB — BASIC METABOLIC PANEL
BUN: 5 mg/dL — ABNORMAL LOW (ref 6–23)
CO2: 30 mEq/L (ref 19–32)
Calcium: 9.5 mg/dL (ref 8.4–10.5)
Chloride: 96 mEq/L (ref 96–112)
Creat: 0.8 mg/dL (ref 0.50–1.10)
Glucose, Bld: 87 mg/dL (ref 70–99)
Potassium: 5 mEq/L (ref 3.5–5.3)
Sodium: 137 mEq/L (ref 135–145)

## 2013-12-09 LAB — CBC WITH DIFFERENTIAL/PLATELET
Basophils Absolute: 0 10*3/uL (ref 0.0–0.1)
Basophils Relative: 0 % (ref 0–1)
Eosinophils Absolute: 0.3 10*3/uL (ref 0.0–0.7)
Eosinophils Relative: 4 % (ref 0–5)
HCT: 47.3 % — ABNORMAL HIGH (ref 36.0–46.0)
Hemoglobin: 16.9 g/dL — ABNORMAL HIGH (ref 12.0–15.0)
Lymphocytes Relative: 37 % (ref 12–46)
Lymphs Abs: 2.7 10*3/uL (ref 0.7–4.0)
MCH: 33.3 pg (ref 26.0–34.0)
MCHC: 35.7 g/dL (ref 30.0–36.0)
MCV: 93.1 fL (ref 78.0–100.0)
Monocytes Absolute: 0.6 10*3/uL (ref 0.1–1.0)
Monocytes Relative: 9 % (ref 3–12)
Neutro Abs: 3.4 10*3/uL (ref 1.7–7.7)
Neutrophils Relative %: 48 % (ref 43–77)
Platelets: 204 10*3/uL (ref 150–400)
RBC: 5.08 MIL/uL (ref 3.87–5.11)
RDW: 13.4 % (ref 11.5–15.5)
WBC: 7.1 10*3/uL (ref 4.0–10.5)

## 2013-12-09 LAB — HEPATIC FUNCTION PANEL
ALT: 11 U/L (ref 0–35)
AST: 19 U/L (ref 0–37)
Albumin: 4.1 g/dL (ref 3.5–5.2)
Alkaline Phosphatase: 125 U/L — ABNORMAL HIGH (ref 39–117)
Bilirubin, Direct: <0.1 mg/dL (ref 0.0–0.3)
Total Bilirubin: 0.3 mg/dL (ref 0.2–1.2)
Total Protein: 6.7 g/dL (ref 6.0–8.3)

## 2013-12-09 LAB — LIPID PANEL
Cholesterol: 203 mg/dL — ABNORMAL HIGH (ref 0–200)
HDL: 35 mg/dL — ABNORMAL LOW (ref >39)
Total CHOL/HDL Ratio: 5.8 Ratio
Triglycerides: 459 mg/dL — ABNORMAL HIGH (ref <150)

## 2013-12-09 LAB — VITAMIN B12: Vitamin B-12: 1942 pg/mL — ABNORMAL HIGH (ref 211–911)

## 2013-12-09 LAB — HEMOGLOBIN A1C
Hgb A1c MFr Bld: 5.6 % (ref <5.7)
Mean Plasma Glucose: 114 mg/dL (ref <117)

## 2013-12-10 ENCOUNTER — Ambulatory Visit (HOSPITAL_COMMUNITY): Payer: BC Managed Care – PPO

## 2013-12-10 ENCOUNTER — Ambulatory Visit (HOSPITAL_COMMUNITY): Payer: BC Managed Care – PPO | Admitting: Physical Therapy

## 2013-12-11 ENCOUNTER — Ambulatory Visit (INDEPENDENT_AMBULATORY_CARE_PROVIDER_SITE_OTHER): Payer: BC Managed Care – PPO | Admitting: Family Medicine

## 2013-12-11 ENCOUNTER — Encounter: Payer: Self-pay | Admitting: Family Medicine

## 2013-12-11 VITALS — BP 112/68 | Ht <= 58 in | Wt 154.0 lb

## 2013-12-11 DIAGNOSIS — G894 Chronic pain syndrome: Secondary | ICD-10-CM

## 2013-12-11 DIAGNOSIS — R262 Difficulty in walking, not elsewhere classified: Secondary | ICD-10-CM

## 2013-12-11 DIAGNOSIS — E781 Pure hyperglyceridemia: Secondary | ICD-10-CM | POA: Diagnosis not present

## 2013-12-11 MED ORDER — GABAPENTIN 300 MG PO CAPS: ORAL_CAPSULE | ORAL | Status: DC

## 2013-12-11 MED ORDER — OXYCODONE-ACETAMINOPHEN 10-325 MG PO TABS: 1.0000 | ORAL_TABLET | Freq: Four times a day (QID) | ORAL | Status: DC | PRN

## 2013-12-11 MED ORDER — OXYCODONE-ACETAMINOPHEN 10-325 MG PO TABS
1.0000 | ORAL_TABLET | Freq: Four times a day (QID) | ORAL | Status: DC | PRN
Start: 2013-12-11 — End: 2013-12-11

## 2013-12-11 NOTE — Progress Notes (Signed)
   Subjective:    Patient ID: [name], female    DOB: Jul 24, 1966, 48 y.o.   MRN: [id number]  HPIMed check up. Patient states no concerns. Needs refill on gabapentin and percocet. Patient with chronic pain. She has back pain leg pain. She uses Percocet she denies abusing it. She states it does help her function.  She is actually improving but still has significant weakness in the arms and the legs. She can now walk without using a wheelchair which is an improvement for her. Her bowels are under good control as well as urination.  Patient states her moods are under good control she uses Celexa on a regular basis Klonopin when necessary.  She has been trying eat healthier but she drinks a lot of soda and she eats a lot of fried foods. Current lab work shows elevated triglycerides.  PMH reviewed she does see a neurologist at [facility] Denies headaches chest pain shortness of breath she does relate weakness denies abdominal pain denies rectal bleeding hematuria denies fever chills    Objective:   Physical Exam Neck no masses lungs clear no crackles heart is regular pulses normal abdomen is soft extremities no edema skin warm dry she is able to walk but she has significant weakness in the left leg and also weakness in her arms       Assessment & Plan:   neurologically she is disabled. She is not able to work due to her weakness and cognitive dysfunction. She does need an assistant to help with her.  Elevated triglycerides healthier diet low-fat minimize sugars in the diet hopefully this will help her. We will repeat lab work in the summer  Chronic pain oxycodone 3 prescriptions written she will followup when these are finished.  Neuropathy continue Neurontin.

## 2013-12-12 ENCOUNTER — Ambulatory Visit (HOSPITAL_COMMUNITY): Payer: BC Managed Care – PPO

## 2013-12-12 ENCOUNTER — Ambulatory Visit (HOSPITAL_COMMUNITY)
Admission: RE | Admit: 2013-12-12 | Discharge: 2013-12-12 | Disposition: A | Discharge status: Home / Self Care | Payer: BC Managed Care – PPO | Source: Ambulatory Visit | Attending: Family Medicine | Admitting: Family Medicine

## 2013-12-12 DIAGNOSIS — R270 Ataxia, unspecified: Secondary | ICD-10-CM

## 2013-12-12 DIAGNOSIS — IMO0001 Reserved for inherently not codable concepts without codable children: Secondary | ICD-10-CM | POA: Diagnosis not present

## 2013-12-12 DIAGNOSIS — R262 Difficulty in walking, not elsewhere classified: Secondary | ICD-10-CM

## 2013-12-12 DIAGNOSIS — M25673 Stiffness of unspecified ankle, not elsewhere classified: Secondary | ICD-10-CM

## 2013-12-12 DIAGNOSIS — M25676 Stiffness of unspecified foot, not elsewhere classified: Principal | ICD-10-CM

## 2013-12-12 DIAGNOSIS — R29898 Other symptoms and signs involving the musculoskeletal system: Secondary | ICD-10-CM

## 2013-12-12 NOTE — Progress Notes (Signed)
Physical Therapy Treatment Patient Details  Name: [name] MRN: [id number] Date of Birth: 1966-07-10  Today's Date: 12/12/2013 Time: 1194-1740 PT Time Calculation (min): 42 min Charge TE [id number], Manual 1545-1600  Visit#: 30 of 35  Re-eval: 12/31/13 Assessment Diagnosis: ataxia Next MD Visit: [name] 03/13/2014 Prior Therapy: HH  Authorization: BCBS  Authorization Time Period:    Authorization Visit#:   of     Subjective: Symptoms/Limitations Symptoms: Bil feet plantar surface pain scale 5/10 today, c/o toe flexion.  Pt reported MD was amazed with pt.'s progress and her walking now  Pt feels ROM is improving Bil ankles, most difficulty with balance at home. Pain Assessment Currently in Pain?: Yes Pain Score: 5  Pain Location: Foot Pain Orientation: Right;Left (plantar surface)  Precautions/Restrictions  Precautions Precautions: Fall Precaution Comments: ambulates with SPC  Exercise/Treatments Ankle Stretches Slant Board Stretch: 3 reps;30 seconds  Balance Exercises Standing Standing Eyes Opened: Narrow base of support (BOS);Foam;Limitations Standing Eyes Opened Limitations: NBOS on foam with 1 minute with pertubation, NBOS with headturns R/L and A/P  Heel Raises: 15 reps;Limitations Heel Raises Limitations: without UE assistance Toe Raise: 15 reps Other Standing Exercises: Rocker board x 2' A/P  and level x 1' with no HHA      Manual Therapy Manual Therapy: Myofascial release Other Manual Therapy: MFR and PROM to bilateral plantar surfaces of feet, PROM for Dorsiflexion and ankle mobilizations  Physical Therapy Assessment and Plan PT Assessment and Plan Clinical Impression Statement: Progressed ankle exercises on dynamic surfaces to improve ankle stability and balance, min assistance required.  Began stability exercises on rockerboard for balance and dorsiflexion strengthening.  Manual techniques complete to biilateral gastrocnemius, soleus and plantar  surface of feet to reduce fascial restrictions and improve ROM with dorsiflexon.  Still awaiting information from representative on insurance coverage with dynasplint. PT Plan: Continue with PROM, manual and modalities to decrease plantar spasm and balance activities as able. Follow up regarding dynamic splint order.    Goals PT Long Term Goals PT Long Term Goal 6: New goal as of 11/12/2013- Pt dorsiflexion B to neutral for a more normalized gt.- progressing  Problem List Patient Active Problem List   Diagnosis Date Noted  . Hypertriglyceridemia 12/11/2013  . Stiffness of joint, not elsewhere classified, ankle and foot 11/12/2013  . Lack of coordination 11/03/2013  . Pernicious anemia 09/12/2013  . Hyperglycemia 09/12/2013  . Elevated transaminase level 09/12/2013  . Weakness 09/12/2013  . Ataxia 09/09/2013  . Difficulty in walking(719.7) 09/09/2013  . Weakness of both legs 09/09/2013  . Generalized weakness 02/11/2013  . Tobacco abuse 01/14/2013  . Bilateral leg edema 01/14/2013  . Pedal edema 01/13/2013  . Chronic pain syndrome 12/30/2012  . Reactive airway disease 12/30/2012  . Encephalopathy acute 07/25/2011  . 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

## 2013-12-15 ENCOUNTER — Ambulatory Visit (HOSPITAL_COMMUNITY)
Admission: RE | Admit: 2013-12-15 | Discharge: 2013-12-15 | Disposition: A | Discharge status: Home / Self Care | Payer: BC Managed Care – PPO | Source: Ambulatory Visit | Attending: Family Medicine | Admitting: Family Medicine

## 2013-12-15 ENCOUNTER — Ambulatory Visit (HOSPITAL_COMMUNITY): Payer: BC Managed Care – PPO | Admitting: Specialist

## 2013-12-15 DIAGNOSIS — R262 Difficulty in walking, not elsewhere classified: Secondary | ICD-10-CM

## 2013-12-15 DIAGNOSIS — R29898 Other symptoms and signs involving the musculoskeletal system: Secondary | ICD-10-CM

## 2013-12-15 DIAGNOSIS — R270 Ataxia, unspecified: Secondary | ICD-10-CM

## 2013-12-15 DIAGNOSIS — M25673 Stiffness of unspecified ankle, not elsewhere classified: Secondary | ICD-10-CM

## 2013-12-15 DIAGNOSIS — IMO0001 Reserved for inherently not codable concepts without codable children: Secondary | ICD-10-CM | POA: Diagnosis not present

## 2013-12-15 DIAGNOSIS — M25676 Stiffness of unspecified foot, not elsewhere classified: Principal | ICD-10-CM

## 2013-12-15 NOTE — Progress Notes (Addendum)
Physical Therapy Treatment Patient Details  Name: [name] MRN: [id number] Date of Birth: 04-04-1966  Today's Date: 12/15/2013 Time: 2297-9892 PT Time Calculation (min): 42 min Charge: Manual 1438-1505, TE 1505-1520  Visit#: 31 of 35  Re-eval: 12/31/13 Assessment Diagnosis: ataxia Next MD Visit: [name] 03/13/2014 Prior Therapy: HH  Authorization: BCBS  Authorization Time Period:    Authorization Visit#:   of     Subjective: Symptoms/Limitations Symptoms: Pt stated she was pain free yesterday, returned to church for first time since October.  Pt reported driving into therapy today. Pain Assessment Currently in Pain?: Yes Pain Score: 4  Pain Location: Foot Pain Orientation: Right;Left  Precautions/Restrictions  Precautions Precautions: Fall Precaution Comments: ambulates with SPC  Exercise/Treatments Ankle Stretches Slant Board Stretch: 3 reps;30 seconds Other Stretch: Crossover calf stretch to improve eversion 2x 30" Ankle Exercises - Seated Ankle Circles/Pumps: Both;10 reps;Limitations Ankle Circles/Pumps Limitations: Therapist facilitation to improve eversion BAPS: Sitting;10 reps;Level 4    Manual Therapy Other Manual Therapy: Manual techniques compliete using compression points 2in lateral tibia and distal medial malleoli to reduce spasticity, MFR and PROM to bilateral plantar surfaces on feet, PROM for dorsiflexion/eversion and calcaneal joint mobs, MFR to STM to gastrocnemius  Physical Therapy Assessment and Plan PT Assessment and Plan Clinical Impression Statement: Session focus on manual techniques to reduce spasticity and improve ROM for dorsiflexion Bil feet.  Noted vast improvements with decreased tightness and improve AROM for dorsiflexion.  Therex focus on improving ankle coordination and increase ROM with dorsiflexion and eversion, therapist facilication required to improve movement.  Added new stretches to improve eversion and balance.   PT Plan:  Continue with PROM, manual and modalities to decrease plantar spasm and balance activities as able. Follow up regarding dynamic splint order.    Goals PT Long Term Goals PT Long Term Goal 6: New goal as of 11/12/2013- Pt dorsiflexion B to neutral for a more normalized gt.- progressing PT Long Term Goal 7: decrease toe and plantar pain to no greater than a 5/10   Problem List Patient Active Problem List   Diagnosis Date Noted  . Hypertriglyceridemia 12/11/2013  . Stiffness of joint, not elsewhere classified, ankle and foot 11/12/2013  . Lack of coordination 11/03/2013  . Pernicious anemia 09/12/2013  . Hyperglycemia 09/12/2013  . Elevated transaminase level 09/12/2013  . Weakness 09/12/2013  . Ataxia 09/09/2013  . Difficulty in walking(719.7) 09/09/2013  . Weakness of both legs 09/09/2013  . Generalized weakness 02/11/2013  . Tobacco abuse 01/14/2013  . Bilateral leg edema 01/14/2013  . Pedal edema 01/13/2013  . Chronic pain syndrome 12/30/2012  . Reactive airway disease 12/30/2012  . Encephalopathy acute 07/25/2011  . 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

## 2013-12-17 ENCOUNTER — Ambulatory Visit (HOSPITAL_COMMUNITY): Payer: BC Managed Care – PPO

## 2013-12-19 ENCOUNTER — Ambulatory Visit (HOSPITAL_COMMUNITY): Payer: BC Managed Care – PPO

## 2013-12-19 ENCOUNTER — Ambulatory Visit (HOSPITAL_COMMUNITY)
Admission: RE | Admit: 2013-12-19 | Discharge: 2013-12-19 | Disposition: A | Discharge status: Home / Self Care | Payer: BC Managed Care – PPO | Source: Ambulatory Visit | Attending: Family Medicine | Admitting: Family Medicine

## 2013-12-19 DIAGNOSIS — M25673 Stiffness of unspecified ankle, not elsewhere classified: Secondary | ICD-10-CM

## 2013-12-19 DIAGNOSIS — R29898 Other symptoms and signs involving the musculoskeletal system: Secondary | ICD-10-CM

## 2013-12-19 DIAGNOSIS — IMO0001 Reserved for inherently not codable concepts without codable children: Secondary | ICD-10-CM | POA: Diagnosis not present

## 2013-12-19 DIAGNOSIS — M25676 Stiffness of unspecified foot, not elsewhere classified: Principal | ICD-10-CM

## 2013-12-19 DIAGNOSIS — R262 Difficulty in walking, not elsewhere classified: Secondary | ICD-10-CM

## 2013-12-19 DIAGNOSIS — R270 Ataxia, unspecified: Secondary | ICD-10-CM

## 2013-12-19 NOTE — Progress Notes (Signed)
Physical Therapy Treatment Patient Details  Name: [name] MRN: [id number] Date of Birth: 1965/10/10  Today's Date: 12/19/2013 Time: 1518-1600 PT Time Calculation (min): 42 min Charge: Manual 1518-1550, TE 1550-1600  Visit#: 32 of 35  Re-eval: 12/31/13 Assessment Diagnosis: ataxia Next MD Visit: [name] 03/13/2014 Prior Therapy: HH  Authorization: BCBS  Authorization Time Period:    Authorization Visit#:   of     Subjective: Symptoms/Limitations Symptoms: Pt reported calves feel more flexible following last session.  Pain Bil feet 7/10 Pain Assessment Currently in Pain?: Yes Pain Score: 7  Pain Location: Foot Pain Orientation: Right;Left  Precautions/Restrictions  Precautions Precautions: Fall Precaution Comments: ambulates no AD  Exercise/Treatments Ankle Stretches Slant Board Stretch: 3 reps;30 seconds Other Stretch: Crossover calf stretch to improve eversion 2x 30"   Manual Therapy Manual Therapy: Myofascial release Other Manual Therapy: Manual techniques compliete using compression points 2 in lateral tibia and distal medial malleoli to reduce spasticity, MFR and PROM to bilateral plantar surfaces on feet, PROM for dorsiflexion/eversion and calcaneal joint mobs, MFR to STM to gastrocnemius  Physical Therapy Assessment and Plan PT Assessment and Plan Clinical Impression Statement: Session focus on manual techniques to reduce spasticity and tightness of Bil gastrocnemius and soleus, positive results with compressoin points 2 in distal and lateral to tibia.  Continued wtih new stretches for gastroc, soleus and peronal mm.  Pt reported pain reduced at end of session. PT Plan: Continue with PROM, manual and modalities to decrease plantar spasm and balance activities as able. Follow up regarding dynamic splint order.    Goals Home Exercise Program Pt/caregiver will Perform Home Exercise Program: For increased ROM PT Long Term Goals PT Long Term Goal 6: New goal  as of 11/12/2013- Pt dorsiflexion B to neutral for a more normalized gt.- progressing PT Long Term Goal 7: decrease toe and plantar pain to no greater than a 5/10   Problem List Patient Active Problem List   Diagnosis Date Noted  . Hypertriglyceridemia 12/11/2013  . Stiffness of joint, not elsewhere classified, ankle and foot 11/12/2013  . Lack of coordination 11/03/2013  . Pernicious anemia 09/12/2013  . Hyperglycemia 09/12/2013  . Elevated transaminase level 09/12/2013  . Weakness 09/12/2013  . Ataxia 09/09/2013  . Difficulty in walking(719.7) 09/09/2013  . Weakness of both legs 09/09/2013  . Generalized weakness 02/11/2013  . Tobacco abuse 01/14/2013  . Bilateral leg edema 01/14/2013  . Pedal edema 01/13/2013  . Chronic pain syndrome 12/30/2012  . Reactive airway disease 12/30/2012  . Encephalopathy acute 07/25/2011  . 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

## 2013-12-22 ENCOUNTER — Ambulatory Visit (HOSPITAL_COMMUNITY)
Admission: RE | Admit: 2013-12-22 | Discharge: 2013-12-22 | Disposition: A | Discharge status: Home / Self Care | Payer: BC Managed Care – PPO | Source: Ambulatory Visit | Attending: Family Medicine | Admitting: Family Medicine

## 2013-12-22 ENCOUNTER — Ambulatory Visit (HOSPITAL_COMMUNITY): Payer: BC Managed Care – PPO | Admitting: Specialist

## 2013-12-22 DIAGNOSIS — IMO0001 Reserved for inherently not codable concepts without codable children: Secondary | ICD-10-CM | POA: Diagnosis not present

## 2013-12-22 NOTE — Progress Notes (Signed)
Physical Therapy Treatment Patient Details  Name: [name] MRN: [id number] Date of Birth: 1966-05-23  Today's Date: 12/22/2013 Time: 1522-1605 PT Time Calculation (min): 43 min  Visit#: 33 of 35  Re-eval: 12/31/13 Assessment Diagnosis: ataxia Next MD Visit: [name] 03/13/2014 Authorization: BCBS  Charges:  therex 23', manual 18' at end of session   Subjective: Pt states her ankles and feet are feeling better, however most pain when she gets up in middle of night to walk to bathroom.  Currently with mild discomfort, 2/10.  Objective: AROM of Right Ankle: Dorsiflexion: 10 degrees from neutral    Plantarflexion: 60 degrees    Inversion: 30 degrees    Eversion: 15 degrees  AROM of Left Ankle: Dorsiflexion: 10 degrees from neutral    Plantarflexion: 60 degrees    Inversion: 32 degrees    Eversion: 15 degrees   Exercise/Treatments Ankle Stretches Slant Board Stretch: 3 reps;30 seconds Ankle Exercises - Seated BAPS: Sitting;10 reps;Level 4;Limitations BAPS Limitations: cw/ccw 5 reps each with therapist facilitation    Manual Therapy Manual Therapy: Other (comment) Other Manual Therapy: Manual techniques compliete using compression points 2 in lateral tibia and distal medial malleoli to reduce spasticity, MFR and PROM to bilateral plantar surfaces on feet, PROM for dorsiflexion/eversion and calcaneal joint mobs, MFR to STM to gastrocnemius  Physical Therapy Assessment and Plan PT Assessment and Plan Clinical Impression Statement: Continued focus on manual techniques to reduce spasticity and tightness of bilateral gastrocnemius and soleus muscles.   Encouraged patient to stretch her ankles more at home; pt verbalized understanding. Bilateral ankle AROM re measured today with improvments noted as compared to last month and will benefit from bilateral dynasplints to improve dorsflexion, per evaluating therapist. PT Plan: Continue with PROM, manual and modalities to decrease  plantar spasm and balance activities as able.  Recommendation for dynasplints for bilateral ankles to improve dorsiflexion, per evaluating therapist.     Problem List Patient Active Problem List   Diagnosis Date Noted  . Hypertriglyceridemia 12/11/2013  . Stiffness of joint, not elsewhere classified, ankle and foot 11/12/2013  . Lack of coordination 11/03/2013  . Pernicious anemia 09/12/2013  . Hyperglycemia 09/12/2013  . Elevated transaminase level 09/12/2013  . Weakness 09/12/2013  . Ataxia 09/09/2013  . Difficulty in walking(719.7) 09/09/2013  . Weakness of both legs 09/09/2013  . Generalized weakness 02/11/2013  . Tobacco abuse 01/14/2013  . Bilateral leg edema 01/14/2013  . Pedal edema 01/13/2013  . Chronic pain syndrome 12/30/2012  . Reactive airway disease 12/30/2012  . Encephalopathy acute 07/25/2011  . 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

## 2013-12-24 ENCOUNTER — Ambulatory Visit (HOSPITAL_COMMUNITY): Payer: BC Managed Care – PPO | Admitting: Physical Therapy

## 2013-12-24 ENCOUNTER — Ambulatory Visit (HOSPITAL_COMMUNITY): Payer: BC Managed Care – PPO | Admitting: Specialist

## 2013-12-26 ENCOUNTER — Ambulatory Visit (HOSPITAL_COMMUNITY): Payer: BC Managed Care – PPO | Admitting: Physical Therapy

## 2013-12-26 ENCOUNTER — Ambulatory Visit (HOSPITAL_COMMUNITY)
Admission: RE | Admit: 2013-12-26 | Discharge: 2013-12-26 | Disposition: A | Discharge status: Home / Self Care | Payer: BC Managed Care – PPO | Source: Ambulatory Visit | Attending: Family Medicine | Admitting: Family Medicine

## 2013-12-26 DIAGNOSIS — R262 Difficulty in walking, not elsewhere classified: Secondary | ICD-10-CM

## 2013-12-26 DIAGNOSIS — R270 Ataxia, unspecified: Secondary | ICD-10-CM

## 2013-12-26 DIAGNOSIS — IMO0001 Reserved for inherently not codable concepts without codable children: Secondary | ICD-10-CM | POA: Diagnosis not present

## 2013-12-26 DIAGNOSIS — M25673 Stiffness of unspecified ankle, not elsewhere classified: Secondary | ICD-10-CM

## 2013-12-26 DIAGNOSIS — R29898 Other symptoms and signs involving the musculoskeletal system: Secondary | ICD-10-CM

## 2013-12-26 DIAGNOSIS — M25676 Stiffness of unspecified foot, not elsewhere classified: Principal | ICD-10-CM

## 2013-12-26 NOTE — Progress Notes (Signed)
Physical Therapy Treatment Patient Details  Name: [name] MRN: [id number] Date of Birth: 12/05/1965  Today's Date: 12/26/2013 Time: 1353 (Pt is late for treatment.)-1430 PT Time Calculation (min): 37 min Charge:US 1884-1660; manual 6301-6010 Visit#: 34 of 35  Re-eval: 12/31/13    Authorization: BCBS  Subjective: Symptoms/Limitations Symptoms: Pt comes to department carrying her cane stating she does not even know why she carrys it around any longer.   Pain Assessment Currently in Pain?: Yes Pain Score: 6  Pain Location: Foot Pain Orientation: Right;Left  Exercise/Treatments   Ankle Exercises - Seated Other Seated Ankle Exercises: PROM B Dorsiflexion  Ankle Exercises - Supine Isometrics: B for all motions xz 10 Modalities Modalities: Ultrasound Manual Therapy Manual Therapy: Other (comment) Other Manual Therapy: Manual techniques completed to instep of B feet as well as MFR to B gastrocnemius mm.  PROM for Dorsiflexion eversion.  Ultrasound Ultrasound Location: B instep 5' each ; Ultrasound Parameters: 1.2 w/cm2 continuous with 3 Mhz  Ultrasound Goals: Pain  Physical Therapy Assessment and Plan PT Assessment and Plan Clinical Impression Statement: Pt was late for session today so focused on manual and [place] to decrease spasticity.  Discussed with pt that next treatment will probably be his last treatment pt agrees and feels she is ready for discharge at this point. PT Plan: Re assess next treatment for D/C     Goals  progressing  Problem List Patient Active Problem List   Diagnosis Date Noted  . Hypertriglyceridemia 12/11/2013  . Stiffness of joint, not elsewhere classified, ankle and foot 11/12/2013  . Lack of coordination 11/03/2013  . Pernicious anemia 09/12/2013  . Hyperglycemia 09/12/2013  . Elevated transaminase level 09/12/2013  . Weakness 09/12/2013  . Ataxia 09/09/2013  . Difficulty in walking(719.7) 09/09/2013  . Weakness of both legs 09/09/2013   . Generalized weakness 02/11/2013  . Tobacco abuse 01/14/2013  . Bilateral leg edema 01/14/2013  . Pedal edema 01/13/2013  . Chronic pain syndrome 12/30/2012  . Reactive airway disease 12/30/2012  . Encephalopathy acute 07/25/2011  . 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

## 2013-12-29 ENCOUNTER — Ambulatory Visit (HOSPITAL_COMMUNITY)
Admission: RE | Admit: 2013-12-29 | Discharge: 2013-12-29 | Disposition: A | Discharge status: Home / Self Care | Payer: BC Managed Care – PPO | Source: Ambulatory Visit | Attending: Family Medicine | Admitting: Family Medicine

## 2013-12-29 ENCOUNTER — Ambulatory Visit (HOSPITAL_COMMUNITY): Payer: BC Managed Care – PPO

## 2013-12-29 DIAGNOSIS — R29898 Other symptoms and signs involving the musculoskeletal system: Secondary | ICD-10-CM

## 2013-12-29 DIAGNOSIS — R262 Difficulty in walking, not elsewhere classified: Secondary | ICD-10-CM

## 2013-12-29 DIAGNOSIS — IMO0001 Reserved for inherently not codable concepts without codable children: Secondary | ICD-10-CM | POA: Diagnosis not present

## 2013-12-29 DIAGNOSIS — M25676 Stiffness of unspecified foot, not elsewhere classified: Principal | ICD-10-CM

## 2013-12-29 DIAGNOSIS — R270 Ataxia, unspecified: Secondary | ICD-10-CM

## 2013-12-29 DIAGNOSIS — M25673 Stiffness of unspecified ankle, not elsewhere classified: Secondary | ICD-10-CM

## 2013-12-29 NOTE — Progress Notes (Signed)
Physical Therapy Re-evaluation/Progress Note/ Discharge summary  Patient Details  Name: [name] MRN: [id number] Date of Birth: [birth year]  Today's Date: 12/29/2013 Time: 2992-4268 PT Time Calculation (min): 46 min Charge Physical performance testing [phone], ROM Measurement 1528-1535, [place] [phone], Manual [phone]              Visit#: 35 of 35  Re-eval: 12/31/13 Assessment Diagnosis: ataxia Next MD Visit: [name] 03/13/2014  Authorization: BCBS    Authorization Time Period:    Authorization Visit#:   of     Subjective Symptoms/Limitations Symptoms: Pt walked into dept with no AD, stated she walked through parking lot without difficulty, How long can you sit comfortably?: sitting has not been a problem How long can you stand comfortably?: Ab;e tp stamd fpr 30 minutes without difficulty (On initial evaluation pt was unable to come sit to stand after three attempts.) How long can you walk comfortably?: Pt able to ambulate 30 minutes withou AD (Pt is walking with and without an walker she states the longest she has walked has beeen five minutes. ) Pain Assessment Currently in Pain?: Yes Pain Score: 4  Pain Location: Foot Pain Orientation: Right;Left  Precautions/Restrictions  Precautions Precautions: Fall Precaution Comments: ambulates no AD  Assessment RLE AROM (degrees) Right Ankle Dorsiflexion: 0 (was -10) Right Ankle Plantar Flexion: 62 (was 60) Right Ankle Inversion:  (was 30) Right Ankle Eversion:  (was 15) LLE AROM (degrees) Left Ankle Dorsiflexion: 0 (was -10) Left Ankle Plantar Flexion: 60 (was 60) Left Ankle Inversion:  (was 32) Left Ankle Eversion:  (was 15)  Exercise/Treatments Mobility/Balance  Berg Balance Test Sit to Stand: Able to stand without using hands and stabilize independently Standing Unsupported: Able to stand safely 2 minutes Sitting with Back Unsupported but Feet Supported on Floor or Stool: Able to sit safely and securely 2  minutes Stand to Sit: Sits safely with minimal use of hands Transfers: Able to transfer safely, minor use of hands Standing Unsupported with Eyes Closed: Able to stand 10 seconds safely (was 3) Standing Ubsupported with Feet Together: Able to place feet together independently and stand 1 minute safely (was 3) From Standing, Reach Forward with Outstretched Arm: Can reach confidently >25 cm (10") From Standing Position, Pick up Object from Floor: Able to pick up shoe safely and easily From Standing Position, Turn to Look Behind Over each Shoulder: Looks behind from both sides and weight shifts well Turn 360 Degrees: Able to turn 360 degrees safely in 4 seconds or less (was 3) Standing Unsupported, Alternately Place Feet on Step/Stool: Able to stand independently and safely and complete 8 steps in 20 seconds Standing Unsupported, One Foot in Front: Able to plae foot ahead of the other independently and hold 30 seconds (was 3) Standing on One Leg: Tries to lift leg/unable to hold 3 seconds but remains standing independently (was 1) Total Score: 52   Modalities Modalities: Ultrasound Manual Therapy Manual Therapy: Other (comment) Other Manual Therapy: Manual techniques complete to Bil instep; MFR to STM to Bil Gastrocnemius and soleus.  PROM Bil LE Dorsiflexion Ultrasound Ultrasound Location: Bil instep 5' each Ultrasound Parameters: 1.2 w/cm2 continuous with 3 Mhz Ultrasound Goals: Pain  Physical Therapy Assessment and Plan PT Assessment and Plan Clinical Impression Statement: Re-assessment complete with the following findings:  Pt independent with HEP daily and able to demonstrate appropriate technique with all exercises and verbalizes appropriate hold time with gastroc and soleus stretches.  Pt able to ambulate independenlty with no AD and no reports  of falls.  Pt improved BERG balance test to 52/56/  Bil ankle ROM improved dorsiflexion to neutral.  Pt has been measured for dynasplint to  assist with dorsiflexion with gait.  Pt reported pain scale average range from 4-7/10 80% of her day.  This session focus on modalities and manual techniques to reduce pain and spasticity to improve dorsiflexion with pain reduced. Pt stated pain scale 1/10 end of session.   PT Plan: D/C to HEP.    Goals Home Exercise Program Pt/caregiver will Perform Home Exercise Program: For increased ROM PT Goal: Perform Home Exercise Program - Progress: Met PT Long Term Goals PT Long Term Goal 6: New goal as of 11/12/2013- Pt dorsiflexion B to neutral for a more normalized gt.- MET- Bil LE at neutral PT Long Term Goal 7: decrease toe and plantar pain to no greater than a 5/10- progressing, average pain scale 7/10  Problem List Patient Active Problem List   Diagnosis Date Noted  . Hypertriglyceridemia 12/11/2013  . Stiffness of joint, not elsewhere classified, ankle and foot 11/12/2013  . Lack of coordination 11/03/2013  . Pernicious anemia 09/12/2013  . Hyperglycemia 09/12/2013  . Elevated transaminase level 09/12/2013  . Weakness 09/12/2013  . Ataxia 09/09/2013  . Difficulty in walking(719.7) 09/09/2013  . Weakness of both legs 09/09/2013  . Generalized weakness 02/11/2013  . Tobacco abuse 01/14/2013  . Bilateral leg edema 01/14/2013  . Pedal edema 01/13/2013  . Chronic pain syndrome 12/30/2012  . Reactive airway disease 12/30/2012  . Encephalopathy acute 07/25/2011  . 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    PT - End of Session Activity Tolerance: Patient tolerated treatment well General Behavior During Therapy: WFL for tasks assessed/performed  GP Functional Assessment Tool Used: clinical judgement Functional Limitation: Mobility: Walking and moving around Mobility: Walking and Moving Around Goal Status [phone]): At least 60 percent but less than 80 percent impaired, limited or restricted Mobility: Walking and Moving Around Discharge Status [phone]): At least 20 percent but less than 40 percent  impaired, limited or restricted  [name] 12/29/2013, 4:20 PM  Physician Documentation Your signature is required to indicate approval of the treatment plan as stated above.  Please sign and either send electronically or make a copy of this report for your files and return this physician signed original.   Please mark one 1.__approve of plan  2. ___approve of plan with the following conditions.   ______________________________                                                          _____________________ Physician Signature                                                                                                             Date

## 2013-12-31 ENCOUNTER — Ambulatory Visit (HOSPITAL_COMMUNITY): Payer: BC Managed Care – PPO

## 2014-01-02 ENCOUNTER — Ambulatory Visit (HOSPITAL_COMMUNITY): Payer: BC Managed Care – PPO

## 2014-01-02 ENCOUNTER — Ambulatory Visit (HOSPITAL_COMMUNITY): Payer: BC Managed Care – PPO | Admitting: Physical Therapy

## 2014-01-05 ENCOUNTER — Ambulatory Visit (HOSPITAL_COMMUNITY): Payer: BC Managed Care – PPO | Admitting: Physical Therapy

## 2014-01-16 ENCOUNTER — Other Ambulatory Visit: Payer: Self-pay | Admitting: Family Medicine

## 2014-02-10 ENCOUNTER — Other Ambulatory Visit: Payer: Self-pay | Admitting: *Deleted

## 2014-02-10 ENCOUNTER — Telehealth: Payer: Self-pay | Admitting: Family Medicine

## 2014-02-10 MED ORDER — MELOXICAM 15 MG PO TABS: ORAL_TABLET | ORAL | Status: DC

## 2014-02-10 NOTE — Telephone Encounter (Signed)
6 refills °

## 2014-02-10 NOTE — Telephone Encounter (Signed)
Medication sent to pharmacy. Patient was notified.  

## 2014-02-10 NOTE — Telephone Encounter (Signed)
Last filled 01/06/14

## 2014-02-10 NOTE — Telephone Encounter (Signed)
meloxicam (MOBIC) 15 MG tablet   Pt states [place] [facility] has sent this to [place] several times  An we have not replied to them  She is out an does not have any for this evening if we could  Send this in today please   Last seen  12/11/13 Last filled   10/09/13

## 2014-02-13 ENCOUNTER — Other Ambulatory Visit: Payer: Self-pay | Admitting: Family Medicine

## 2014-03-12 ENCOUNTER — Other Ambulatory Visit: Payer: Self-pay | Admitting: Family Medicine

## 2014-03-13 ENCOUNTER — Encounter: Payer: Self-pay | Admitting: Family Medicine

## 2014-03-13 ENCOUNTER — Ambulatory Visit (INDEPENDENT_AMBULATORY_CARE_PROVIDER_SITE_OTHER): Payer: BC Managed Care – PPO | Admitting: Family Medicine

## 2014-03-13 VITALS — BP 110/72 | Ht <= 58 in | Wt 158.0 lb

## 2014-03-13 DIAGNOSIS — R279 Unspecified lack of coordination: Secondary | ICD-10-CM | POA: Diagnosis not present

## 2014-03-13 DIAGNOSIS — J45909 Unspecified asthma, uncomplicated: Secondary | ICD-10-CM

## 2014-03-13 DIAGNOSIS — E781 Pure hyperglyceridemia: Secondary | ICD-10-CM | POA: Diagnosis not present

## 2014-03-13 DIAGNOSIS — G8929 Other chronic pain: Secondary | ICD-10-CM

## 2014-03-13 DIAGNOSIS — G609 Hereditary and idiopathic neuropathy, unspecified: Secondary | ICD-10-CM

## 2014-03-13 DIAGNOSIS — M549 Dorsalgia, unspecified: Secondary | ICD-10-CM | POA: Diagnosis not present

## 2014-03-13 DIAGNOSIS — R270 Ataxia, unspecified: Secondary | ICD-10-CM

## 2014-03-13 MED ORDER — PREGABALIN 150 MG PO CAPS: 150.0000 mg | ORAL_CAPSULE | Freq: Two times a day (BID) | ORAL | Status: DC

## 2014-03-13 MED ORDER — CLONAZEPAM 0.5 MG PO TABS: ORAL_TABLET | ORAL | Status: DC

## 2014-03-13 MED ORDER — CITALOPRAM HYDROBROMIDE 20 MG PO TABS: ORAL_TABLET | ORAL | Status: DC

## 2014-03-13 MED ORDER — POTASSIUM CHLORIDE ER 10 MEQ PO TBCR: EXTENDED_RELEASE_TABLET | ORAL | Status: DC

## 2014-03-13 MED ORDER — OXYCODONE-ACETAMINOPHEN 10-325 MG PO TABS: 1.0000 | ORAL_TABLET | Freq: Four times a day (QID) | ORAL | Status: DC | PRN

## 2014-03-13 MED ORDER — ALBUTEROL SULFATE HFA 108 (90 BASE) MCG/ACT IN AERS: 2.0000 | INHALATION_SPRAY | Freq: Four times a day (QID) | RESPIRATORY_TRACT | Status: DC | PRN

## 2014-03-13 NOTE — Patient Instructions (Signed)
DASH Diet  The DASH diet stands for "Dietary Approaches to Stop Hypertension." It is a healthy eating plan that has been shown to reduce high blood pressure (hypertension) in as little as 14 days, while also possibly providing other significant health benefits. These other health benefits include reducing the risk of breast cancer after menopause and reducing the risk of type 2 diabetes, heart disease, colon cancer, and stroke. Health benefits also include weight loss and slowing kidney failure in patients with chronic kidney disease.   DIET GUIDELINES  · Limit salt (sodium). Your diet should contain less than 1500 mg of sodium daily.  · Limit refined or processed carbohydrates. Your diet should include mostly whole grains. Desserts and added sugars should be used sparingly.  · Include small amounts of heart-healthy fats. These types of fats include nuts, oils, and tub margarine. Limit saturated and trans fats. These fats have been shown to be harmful in the body.  CHOOSING FOODS   The following food groups are based on a 2000 calorie diet. See your Registered Dietitian for individual calorie needs.  Grains and Grain Products (6 to 8 servings daily)  · Eat More Often: Whole-wheat bread, brown rice, whole-grain or wheat pasta, quinoa, popcorn without added fat or salt (air popped).  · Eat Less Often: White bread, white pasta, white rice, cornbread.  Vegetables (4 to 5 servings daily)  · Eat More Often: Fresh, frozen, and canned vegetables. Vegetables may be raw, steamed, roasted, or grilled with a minimal amount of fat.  · Eat Less Often/Avoid: Creamed or fried vegetables. Vegetables in a cheese sauce.  Fruit (4 to 5 servings daily)  · Eat More Often: All fresh, canned (in natural juice), or frozen fruits. Dried fruits without added sugar. One hundred percent fruit juice (½ cup [237 mL] daily).  · Eat Less Often: Dried fruits with added sugar. Canned fruit in light or heavy syrup.  Lean Meats, Fish, and Poultry (2  servings or less daily. One serving is 3 to 4 oz [85-114 g]).  · Eat More Often: Ninety percent or leaner ground beef, tenderloin, sirloin. Round cuts of beef, chicken breast, turkey breast. All fish. Grill, bake, or broil your meat. Nothing should be fried.  · Eat Less Often/Avoid: Fatty cuts of meat, turkey, or chicken leg, thigh, or wing. Fried cuts of meat or fish.  Dairy (2 to 3 servings)  · Eat More Often: Low-fat or fat-free milk, low-fat plain or light yogurt, reduced-fat or part-skim cheese.  · Eat Less Often/Avoid: Milk (whole, 2%). Whole milk yogurt. Full-fat cheeses.  Nuts, Seeds, and Legumes (4 to 5 servings per week)  · Eat More Often: All without added salt.  · Eat Less Often/Avoid: Salted nuts and seeds, canned beans with added salt.  Fats and Sweets (limited)  · Eat More Often: Vegetable oils, tub margarines without trans fats, sugar-free gelatin. Mayonnaise and salad dressings.  · Eat Less Often/Avoid: Coconut oils, palm oils, butter, stick margarine, cream, half and half, cookies, candy, pie.  FOR MORE INFORMATION  The Dash Diet Eating Plan: www.dashdiet.org  Document Released: 09/14/2011 Document Revised: 12/18/2011 Document Reviewed: 09/14/2011  [employer]® Patient Information ©2014 [employer].

## 2014-03-13 NOTE — Progress Notes (Signed)
   Subjective:    Patient ID: [name], female    DOB: [birth year], 48 y.o.   MRN: [id number]  HPI Patient is here today for a 3 month check up.  She needs a refill on her meds. She is requesting refills on her pain medicine she also relates severe neuropathy and does not feel that gabapentin is helping she would like to try Lyrica.  She feels SOB for a month now. She is out of her inhaler, so she needs a refill on that as well.   Patient has been counseled to quit.  Review of Systems She relates occasional shortness of breath no chest pressure or tightness.    Objective:   Physical Exam Lungs are clear no wheezing noted heart is regular extremities no edema there is no sign of CHF. Patient has subjective back pain and discomfort down her legs.       Assessment & Plan:  #1 COPD-she is encouraged to quit smoking albuterol inhaler prescribed #2 chronic back pain prescription for pain medications was given three-month supply. She will followup 3 months #3 anxiety issues refills on her medicines given she was cautioned. Not to misuse or overuse these.

## 2014-03-31 ENCOUNTER — Telehealth: Payer: Self-pay | Admitting: Family Medicine

## 2014-03-31 NOTE — Telephone Encounter (Signed)
Pt is having foot pain, making it difficulty for her to walk.

## 2014-03-31 NOTE — Telephone Encounter (Signed)
Was seen 6/5

## 2014-03-31 NOTE — Telephone Encounter (Signed)
Hi, trying to discern is there a problem with Lyrica? Side effect? Or does she just feels the neurontin helps more?

## 2014-03-31 NOTE — Telephone Encounter (Signed)
Patient is having issues with lyrica. She said she is hurting really bad and would like to go back to neurontin.   [employer]

## 2014-03-31 NOTE — Telephone Encounter (Signed)
I am sympathetic that this patient is hurting. But I need to know more than just she wants to go back on Neurontin. What is happening with the Lyrica? Is there specific side effects or problems she is having? Then we can entertain twice a day of going back on the Neurontin. Thank you for finding this out.

## 2014-04-01 ENCOUNTER — Emergency Department (HOSPITAL_COMMUNITY): Payer: BC Managed Care – PPO

## 2014-04-01 ENCOUNTER — Inpatient Hospital Stay (HOSPITAL_COMMUNITY)
Admission: EM | Admit: 2014-04-01 | Discharge: 2014-04-05 | DRG: 917 | Disposition: A | Discharge status: Home / Self Care | Payer: BC Managed Care – PPO | Attending: Internal Medicine | Admitting: Internal Medicine

## 2014-04-01 ENCOUNTER — Encounter (HOSPITAL_COMMUNITY): Payer: Self-pay | Admitting: Emergency Medicine

## 2014-04-01 DIAGNOSIS — G92 Toxic encephalopathy: Secondary | ICD-10-CM | POA: Diagnosis not present

## 2014-04-01 DIAGNOSIS — R740 Nonspecific elevation of levels of transaminase and lactic acid dehydrogenase [LDH]: Secondary | ICD-10-CM

## 2014-04-01 DIAGNOSIS — F39 Unspecified mood [affective] disorder: Secondary | ICD-10-CM

## 2014-04-01 DIAGNOSIS — G35 Multiple sclerosis: Secondary | ICD-10-CM | POA: Diagnosis present

## 2014-04-01 DIAGNOSIS — Z833 Family history of diabetes mellitus: Secondary | ICD-10-CM | POA: Diagnosis not present

## 2014-04-01 DIAGNOSIS — M25676 Stiffness of unspecified foot, not elsewhere classified: Secondary | ICD-10-CM

## 2014-04-01 DIAGNOSIS — M545 Low back pain, unspecified: Secondary | ICD-10-CM | POA: Diagnosis present

## 2014-04-01 DIAGNOSIS — Z880 Allergy status to penicillin: Secondary | ICD-10-CM

## 2014-04-01 DIAGNOSIS — K219 Gastro-esophageal reflux disease without esophagitis: Secondary | ICD-10-CM | POA: Diagnosis present

## 2014-04-01 DIAGNOSIS — J4489 Other specified chronic obstructive pulmonary disease: Secondary | ICD-10-CM | POA: Diagnosis present

## 2014-04-01 DIAGNOSIS — Z91040 Latex allergy status: Secondary | ICD-10-CM | POA: Diagnosis not present

## 2014-04-01 DIAGNOSIS — T426X4A Poisoning by other antiepileptic and sedative-hypnotic drugs, undetermined, initial encounter: Secondary | ICD-10-CM | POA: Diagnosis present

## 2014-04-01 DIAGNOSIS — IMO0001 Reserved for inherently not codable concepts without codable children: Secondary | ICD-10-CM | POA: Diagnosis present

## 2014-04-01 DIAGNOSIS — R7401 Elevation of levels of liver transaminase levels: Secondary | ICD-10-CM

## 2014-04-01 DIAGNOSIS — R7309 Other abnormal glucose: Secondary | ICD-10-CM | POA: Diagnosis present

## 2014-04-01 DIAGNOSIS — G9340 Encephalopathy, unspecified: Secondary | ICD-10-CM | POA: Diagnosis present

## 2014-04-01 DIAGNOSIS — R262 Difficulty in walking, not elsewhere classified: Secondary | ICD-10-CM

## 2014-04-01 DIAGNOSIS — Z8673 Personal history of transient ischemic attack (TIA), and cerebral infarction without residual deficits: Secondary | ICD-10-CM | POA: Diagnosis present

## 2014-04-01 DIAGNOSIS — G929 Unspecified toxic encephalopathy: Secondary | ICD-10-CM | POA: Diagnosis present

## 2014-04-01 DIAGNOSIS — Z888 Allergy status to other drugs, medicaments and biological substances status: Secondary | ICD-10-CM | POA: Diagnosis not present

## 2014-04-01 DIAGNOSIS — F509 Eating disorder, unspecified: Secondary | ICD-10-CM | POA: Diagnosis present

## 2014-04-01 DIAGNOSIS — R531 Weakness: Secondary | ICD-10-CM

## 2014-04-01 DIAGNOSIS — J9819 Other pulmonary collapse: Secondary | ICD-10-CM | POA: Diagnosis not present

## 2014-04-01 DIAGNOSIS — Z8249 Family history of ischemic heart disease and other diseases of the circulatory system: Secondary | ICD-10-CM | POA: Diagnosis not present

## 2014-04-01 DIAGNOSIS — G589 Mononeuropathy, unspecified: Secondary | ICD-10-CM | POA: Diagnosis present

## 2014-04-01 DIAGNOSIS — R29898 Other symptoms and signs involving the musculoskeletal system: Secondary | ICD-10-CM

## 2014-04-01 DIAGNOSIS — Z885 Allergy status to narcotic agent status: Secondary | ICD-10-CM | POA: Diagnosis not present

## 2014-04-01 DIAGNOSIS — F17200 Nicotine dependence, unspecified, uncomplicated: Secondary | ICD-10-CM | POA: Diagnosis present

## 2014-04-01 DIAGNOSIS — T4271XA Poisoning by unspecified antiepileptic and sedative-hypnotic drugs, accidental (unintentional), initial encounter: Secondary | ICD-10-CM | POA: Diagnosis present

## 2014-04-01 DIAGNOSIS — I639 Cerebral infarction, unspecified: Secondary | ICD-10-CM

## 2014-04-01 DIAGNOSIS — T50901A Poisoning by unspecified drugs, medicaments and biological substances, accidental (unintentional), initial encounter: Secondary | ICD-10-CM | POA: Diagnosis not present

## 2014-04-01 DIAGNOSIS — M25673 Stiffness of unspecified ankle, not elsewhere classified: Secondary | ICD-10-CM

## 2014-04-01 DIAGNOSIS — I10 Essential (primary) hypertension: Secondary | ICD-10-CM

## 2014-04-01 DIAGNOSIS — G253 Myoclonus: Secondary | ICD-10-CM

## 2014-04-01 DIAGNOSIS — J4530 Mild persistent asthma, uncomplicated: Secondary | ICD-10-CM

## 2014-04-01 DIAGNOSIS — Z791 Long term (current) use of non-steroidal anti-inflammatories (NSAID): Secondary | ICD-10-CM | POA: Diagnosis not present

## 2014-04-01 DIAGNOSIS — Z720 Tobacco use: Secondary | ICD-10-CM

## 2014-04-01 DIAGNOSIS — G894 Chronic pain syndrome: Secondary | ICD-10-CM | POA: Diagnosis present

## 2014-04-01 DIAGNOSIS — J449 Chronic obstructive pulmonary disease, unspecified: Secondary | ICD-10-CM | POA: Diagnosis present

## 2014-04-01 DIAGNOSIS — R5383 Other fatigue: Secondary | ICD-10-CM | POA: Diagnosis present

## 2014-04-01 DIAGNOSIS — G35D Multiple sclerosis, unspecified: Secondary | ICD-10-CM | POA: Diagnosis not present

## 2014-04-01 DIAGNOSIS — D510 Vitamin B12 deficiency anemia due to intrinsic factor deficiency: Secondary | ICD-10-CM

## 2014-04-01 DIAGNOSIS — R739 Hyperglycemia, unspecified: Secondary | ICD-10-CM

## 2014-04-01 DIAGNOSIS — N390 Urinary tract infection, site not specified: Secondary | ICD-10-CM | POA: Diagnosis present

## 2014-04-01 DIAGNOSIS — J45909 Unspecified asthma, uncomplicated: Secondary | ICD-10-CM | POA: Diagnosis present

## 2014-04-01 DIAGNOSIS — F411 Generalized anxiety disorder: Secondary | ICD-10-CM | POA: Diagnosis present

## 2014-04-01 DIAGNOSIS — R5381 Other malaise: Secondary | ICD-10-CM | POA: Diagnosis not present

## 2014-04-01 DIAGNOSIS — I6359 Cerebral infarction due to unspecified occlusion or stenosis of other cerebral artery: Secondary | ICD-10-CM

## 2014-04-01 DIAGNOSIS — B961 Klebsiella pneumoniae [K. pneumoniae] as the cause of diseases classified elsewhere: Secondary | ICD-10-CM | POA: Diagnosis present

## 2014-04-01 DIAGNOSIS — R270 Ataxia, unspecified: Secondary | ICD-10-CM

## 2014-04-01 DIAGNOSIS — R279 Unspecified lack of coordination: Secondary | ICD-10-CM

## 2014-04-01 DIAGNOSIS — I6350 Cerebral infarction due to unspecified occlusion or stenosis of unspecified cerebral artery: Secondary | ICD-10-CM | POA: Diagnosis not present

## 2014-04-01 DIAGNOSIS — R600 Localized edema: Secondary | ICD-10-CM

## 2014-04-01 DIAGNOSIS — I6789 Other cerebrovascular disease: Secondary | ICD-10-CM | POA: Diagnosis not present

## 2014-04-01 DIAGNOSIS — D72829 Elevated white blood cell count, unspecified: Secondary | ICD-10-CM | POA: Diagnosis present

## 2014-04-01 DIAGNOSIS — E781 Pure hyperglyceridemia: Secondary | ICD-10-CM

## 2014-04-01 LAB — CBC WITH DIFFERENTIAL/PLATELET
BASOS ABS: 0 10*3/uL (ref 0.0–0.1)
Basophils Relative: 0 % (ref 0–1)
EOS ABS: 0.1 10*3/uL (ref 0.0–0.7)
EOS PCT: 0 % (ref 0–5)
HCT: 51.7 % — ABNORMAL HIGH (ref 36.0–46.0)
Hemoglobin: 17.3 g/dL — ABNORMAL HIGH (ref 12.0–15.0)
LYMPHS ABS: 2.2 10*3/uL (ref 0.7–4.0)
LYMPHS PCT: 13 % (ref 12–46)
MCH: 34.3 pg — ABNORMAL HIGH (ref 26.0–34.0)
MCHC: 33.5 g/dL (ref 30.0–36.0)
MCV: 102.6 fL — AB (ref 78.0–100.0)
Monocytes Absolute: 0.9 10*3/uL (ref 0.1–1.0)
Monocytes Relative: 5 % (ref 3–12)
NEUTROS PCT: 82 % — AB (ref 43–77)
Neutro Abs: 14.7 10*3/uL — ABNORMAL HIGH (ref 1.7–7.7)
Platelets: 212 10*3/uL (ref 150–400)
RBC: 5.04 MIL/uL (ref 3.87–5.11)
RDW: 12.7 % (ref 11.5–15.5)
WBC: 17.9 10*3/uL — AB (ref 4.0–10.5)

## 2014-04-01 LAB — COMPREHENSIVE METABOLIC PANEL
ALK PHOS: 140 U/L — AB (ref 39–117)
ALT: 11 U/L (ref 0–35)
Albumin: 3.3 g/dL — ABNORMAL LOW (ref 3.5–5.2)
BUN: 5 mg/dL — ABNORMAL LOW (ref 6–23)
CALCIUM: 8.8 mg/dL (ref 8.4–10.5)
CO2: 30 meq/L (ref 19–32)
Chloride: 94 mEq/L — ABNORMAL LOW (ref 96–112)
Creatinine, Ser: 0.8 mg/dL (ref 0.50–1.10)
GFR calc Af Amer: >90 mL/min (ref >90)
GFR calc non Af Amer: 86 mL/min — ABNORMAL LOW (ref >90)
GLUCOSE: 189 mg/dL — AB (ref 70–99)
Potassium: 5.2 mEq/L (ref 3.7–5.3)
SODIUM: 136 meq/L — AB (ref 137–147)
TOTAL PROTEIN: 7.5 g/dL (ref 6.0–8.3)
Total Bilirubin: <0.2 mg/dL — ABNORMAL LOW (ref 0.3–1.2)

## 2014-04-01 LAB — TROPONIN I: Troponin I: <0.3 ng/mL (ref <0.30)

## 2014-04-01 LAB — URINE MICROSCOPIC-ADD ON

## 2014-04-01 LAB — RAPID URINE DRUG SCREEN, HOSP PERFORMED
AMPHETAMINES: NOT DETECTED
BENZODIAZEPINES: POSITIVE — AB
Barbiturates: NOT DETECTED
Cocaine: NOT DETECTED
Opiates: NOT DETECTED
TETRAHYDROCANNABINOL: NOT DETECTED

## 2014-04-01 LAB — SALICYLATE LEVEL

## 2014-04-01 LAB — URINALYSIS, ROUTINE W REFLEX MICROSCOPIC
Bilirubin Urine: NEGATIVE
GLUCOSE, UA: 100 mg/dL — AB
Hgb urine dipstick: NEGATIVE
KETONES UR: NEGATIVE mg/dL
Nitrite: POSITIVE — AB
Protein, ur: NEGATIVE mg/dL
Specific Gravity, Urine: >1.03 — ABNORMAL HIGH (ref 1.005–1.030)
Urobilinogen, UA: 0.2 mg/dL (ref 0.0–1.0)
pH: 5.5 (ref 5.0–8.0)

## 2014-04-01 LAB — CBG MONITORING, ED: GLUCOSE-CAPILLARY: 165 mg/dL — AB (ref 70–99)

## 2014-04-01 LAB — ACETAMINOPHEN LEVEL: Acetaminophen (Tylenol), Serum: <15 ug/mL (ref 10–30)

## 2014-04-01 LAB — ETHANOL: Alcohol, Ethyl (B): <11 mg/dL (ref 0–11)

## 2014-04-01 LAB — POC URINE PREG, ED: PREG TEST UR: NEGATIVE

## 2014-04-01 MED ORDER — CIPROFLOXACIN IN D5W 400 MG/200ML IV SOLN
400.0000 mg | Freq: Once | INTRAVENOUS | Status: AC
  Administered 2014-04-02: 400 mg via INTRAVENOUS
  Filled 2014-04-01: qty 200

## 2014-04-01 MED ORDER — NALOXONE HCL 0.4 MG/ML IJ SOLN
0.4000 mg | Freq: Once | INTRAMUSCULAR | Status: AC
  Administered 2014-04-01: 0.4 mg via INTRAVENOUS
  Filled 2014-04-01: qty 1

## 2014-04-01 MED ORDER — SODIUM CHLORIDE 0.9 % IV BOLUS (SEPSIS)
1000.0000 mL | Freq: Once | INTRAVENOUS | Status: AC
  Administered 2014-04-01: 1000 mL via INTRAVENOUS

## 2014-04-01 MED ORDER — GABAPENTIN 300 MG PO CAPS: 300.0000 mg | ORAL_CAPSULE | Freq: Three times a day (TID) | ORAL | Status: DC

## 2014-04-01 NOTE — ED Provider Notes (Addendum)
This chart was scribed for [place], DO, by Neta Ehlers, ED Scribe. This patient was seen in room APA18/[facility]18.   TIME SEEN: 10:00 PM   CHIEF COMPLAINT: Altered Mental Status    HPI:  [name] is a 48 y.o. female with a h/o COPD, MS, and depression who was brought to the ED via EMS presenting with altered mental status. Per EMS, her partner found her unresponsive today. When EMS arrived, her oxygen saturation was 49%. In the ED on 4 liters of oxygen, her saturation is 93%. Ms. Sendejo denies drug or alcohol usage today. Per the nurse, Her hsband keeps her narcotic medication locked up, but he is unsure if she accessed the medicine. She states she has not taken her medication, including flexeril and percocet, today. She denies suicidal ideations or homicidal ideations.  Denies any current medical complaints.  ROS: See HPI Constitutional: no fever  Eyes: no drainage  ENT: no runny nose   Cardiovascular:  no chest pain  Resp: no SOB  GI: no vomiting GU: positive urinary incontinence, no dysuria Integumentary: no rash  Allergy: no hives  Musculoskeletal: no leg swelling  Neurological: positive confusion, no slurred speech ROS otherwise negative  PAST MEDICAL HISTORY/PAST SURGICAL HISTORY:  Past Medical History  Diagnosis Date  . Anxiety   . Depression   . COPD (chronic obstructive pulmonary disease)   . MS (multiple sclerosis)   . Fibromyalgia   . Impaired fasting glucose   . 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 Eating disorder   . Chronic low back pain   . PONV (postoperative nausea and vomiting)   . Chronic bronchitis     "yearly" (02/11/2013)  . Borderline diabetes   . GERD (gastroesophageal reflux disease)   . Migraine     "I use to" (02/11/2013)  . Arthritis     "hands & feet" (02/11/2013)  . Peripheral neuropathy     [name] 02/11/2013  . B12 deficiency anemia     [name] 02/11/2013  . Chronic pain syndrome     [name] 02/11/2013   . UTI (lower urinary tract infection) 02/11/2013    [name] 02/11/2013  . Chronic edema     BLE/notes 02/11/2013    MEDICATIONS:  Prior to Admission medications   Medication Sig Start Date End Date Taking? Authorizing Provider  albuterol (PROVENTIL HFA;VENTOLIN HFA) 108 (90 BASE) MCG/ACT inhaler Inhale 2 puffs into the lungs every 6 (six) hours as needed. COPD 03/13/14   Kathyrn Drown, MD  citalopram (CELEXA) 20 MG tablet TAKE ONE TABLET DAILY. 03/13/14   Kathyrn Drown, MD  clonazePAM (KLONOPIN) 0.5 MG tablet TAKE ONE TABLET BY MOUTH TWICE DAILY AS NEEDED FOR ANXIETY. 03/13/14   Kathyrn Drown, MD  cyclobenzaprine (FLEXERIL) 10 MG tablet TAKE (1) TABLET BY MOUTH AT BEDTIME. 12/08/13   Kathyrn Drown, MD  gabapentin (NEURONTIN) 300 MG capsule Take 1 capsule (300 mg total) by mouth 3 (three) times daily. Start with one twice a day for first one to 2 weeks 04/01/14   Kathyrn Drown, MD  meloxicam (MOBIC) 15 MG tablet TAKE 1 TABLET BY MOUTH DAILY. 02/10/14   Kathyrn Drown, MD  Multiple Vitamins-Calcium (ONE-A-DAY WOMENS PO) Take 1 tablet by mouth every morning.     Historical Provider, MD  oxyCODONE-acetaminophen (PERCOCET) 10-325 MG per tablet Take 1 tablet by mouth every 6 (six) hours as needed for pain (no greater than 4 per day. don't take if drowsy).  03/13/14   Kathyrn Drown, MD  potassium chloride (K-DUR) 10 MEQ tablet TAKE 2 TABLETS BY MOUTH ONCE DAILY. 03/13/14   Kathyrn Drown, MD    ALLERGIES:  Allergies  Allergen Reactions  . Ambien [Zolpidem Tartrate]   . Ceftin [Cefuroxime Axetil] Nausea And Vomiting  . Hctz [Hydrochlorothiazide]     Urinary retention  . Hydrocodone Nausea Only  . Lodine [Etodolac] Hives  . Promethazine Other (See Comments)    Hallucinations.   . Roxicodone [Oxycodone Hcl] Swelling  . Codeine Rash  . Latex Rash  . Penicillins Rash    SOCIAL HISTORY:  History  Substance Use Topics  . Smoking status: Current Every Day Smoker -- 1.00 packs/day for 33 years    Types:  Cigarettes  . Smokeless tobacco: Former Systems developer     Comment: 02/11/2013 offered smoking cessation materials; pt declines  . Alcohol Use: No    FAMILY HISTORY: Family History  Problem Relation Age of Onset  . Anesthesia problems Neg Hx   . Hypotension Neg Hx   . Malignant hyperthermia Neg Hx   . Pseudochol deficiency Neg Hx   . Hyperlipidemia Mother   . Heart disease Mother   . Cancer Mother     lung  . Diabetes Father   . Heart disease Father   . Cancer Maternal Grandmother     breast    EXAM: Triage Vitals: BP 119/80  Pulse 118  Temp(Src) 98.2 F (36.8 C) (Oral)  Resp 20  Ht 4\' 9"  (1.448 m)  Wt 158 lb (71.668 kg)  BMI 34.18 kg/m2  SpO2 93%  CONSTITUTIONAL: Alert and oriented and will follow commands intermittently but is drowsy and falls asleep during questioning, arousable with verbal stimuli, protecting her airway HEAD: Normocephalic EYES: Conjunctivae clear, PERRL, pupils are 4 mm and equal reactive bilaterally ENT: normal nose; no rhinorrhea; dry mucous membranes; pharynx without lesions noted NECK: Supple, no meningismus, no LAD  CARD: Regular and tachycardic; S1 and S2 appreciated; no murmurs, no clicks, no rubs, no gallops RESP: Normal chest excursion without splinting or tachypnea; breath sounds clear and equal bilaterally; no wheezes, no rhonchi, no rales,  ABD/GI: Normal bowel sounds; non-distended; soft, non-tender, no rebound, no guarding BACK:  The back appears normal and is non-tender to palpation, there is no CVA tenderness EXT: Normal ROM in all joints; non-tender to palpation; no edema; normal capillary refill; no cyanosis    SKIN: Normal color for age and race; warm NEURO: Moves all extremities equally, sensation to light-touch intact diffusely, cranial nerves II through XII intact PSYCH: The patient's mood and manner are appropriate. Grooming and personal hygiene are appropriate.  MEDICAL DECISION MAKING: Patient here with altered mental status, hypoxia.  Suspect drug overdose. Patient has a history of narcotic use. She denies any suicidal or homicidal ideation. Will obtain labs, acetaminophen and salicylate levels, ethanol level, drug screen. We'll obtain CT imaging of her head given her altered mental status and chest x-ray given her hypoxia.  ED PROGRESS: Patient's labs are unremarkable other than a leukocytosis of 17.9 with left shift. Troponin negative. Her Tylenol, salicylate and alcohol levels are negative. Urine drug screening is positive for benzodiazepines but not opiates. She does appear to have a nitrite-positive UTI. Will give IV Cipro. Culture pending. Patient is much more awake, alert after given Narcan in the emergency department despite having a drug screen that is negative for opiates. After she was given Narcan patient became immediately alert, sat straight up in the bed,  began complaining of pain, became tachycardic and hypertensive and actively vomiting. Suspect non intentional drug overdose. Possible Fentanyl Given negative opiates on UDS.   12:51 PM  Pt's chest x-ray is clear. She has no hypoxia in the emergency department has been weaned off oxygen. She is tremulous and appears agitated which is likely due to [place] giving her Narcan. She is hypertensive as well. We'll give a small dose of Ativan and clonidine and reassess.   1:05 AM  Pt's chest x-ray is clear. 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 Discussed with Dr. Jana Hakim with hospitalist service who recommends transfer to [facility] for stroke workup. Will consult neurology.   1:15 AM  Pt's BP has improved to 125/76 with clonidine and Ativan.  Discussed with Dr. Leonel Ramsay with neurology who agrees patient will need an MRI of her brain. He thinks his or less likely acute strokes and are more likely hypodensities that are seen after acute overdose. He agrees however with transfer to [facility].  Patient and  significant other at bedside agree with plan.    EKG Interpretation  Date/Time:  Wednesday April 01 2014 21:53:13 EDT Ventricular Rate:  112 PR Interval:  104 QRS Duration: 79 QT Interval:  318 QTC Calculation: 434 R Axis:   46 Text Interpretation:  Sinus tachycardia Ventricular premature complex Artifact No significant change since last tracing Confirmed by WARD,  DO, KRISTEN [phone]) on 04/01/2014 10:12:25 PM        Delice Bison Ward, DO 04/02/14 [place], DO 04/02/14 [id number]

## 2014-04-01 NOTE — ED Notes (Signed)
Pt was found in a recliner at home unresponsive by boyfriend. On fire dept arrival pt o2 sat was 49% and was placed on nonrebreather. Pt's blood sugar was 256.

## 2014-04-01 NOTE — Telephone Encounter (Signed)
Rx sent electronically to pharmacy. Patient notified. 

## 2014-04-01 NOTE — Telephone Encounter (Signed)
Patient states she has been having foot pain since being on lyrica and wasn't having this problem when on the neurontin

## 2014-04-01 NOTE — Telephone Encounter (Signed)
She may stop the Lyrica. Start Neurontin 300 mg initially one twice a day for the first 1-2 weeks then she can do one 3 times a day. Call in 90 with 4 refills with instructions for one 3 times a day. Discontinue Lyrica

## 2014-04-02 ENCOUNTER — Inpatient Hospital Stay (HOSPITAL_COMMUNITY): Payer: BC Managed Care – PPO

## 2014-04-02 DIAGNOSIS — R7309 Other abnormal glucose: Secondary | ICD-10-CM

## 2014-04-02 DIAGNOSIS — G253 Myoclonus: Secondary | ICD-10-CM

## 2014-04-02 DIAGNOSIS — I10 Essential (primary) hypertension: Secondary | ICD-10-CM

## 2014-04-02 DIAGNOSIS — Z8673 Personal history of transient ischemic attack (TIA), and cerebral infarction without residual deficits: Secondary | ICD-10-CM | POA: Diagnosis present

## 2014-04-02 DIAGNOSIS — F17200 Nicotine dependence, unspecified, uncomplicated: Secondary | ICD-10-CM

## 2014-04-02 DIAGNOSIS — R5383 Other fatigue: Secondary | ICD-10-CM

## 2014-04-02 DIAGNOSIS — D72829 Elevated white blood cell count, unspecified: Secondary | ICD-10-CM | POA: Diagnosis present

## 2014-04-02 DIAGNOSIS — R5381 Other malaise: Secondary | ICD-10-CM

## 2014-04-02 DIAGNOSIS — G9340 Encephalopathy, unspecified: Secondary | ICD-10-CM

## 2014-04-02 DIAGNOSIS — I6789 Other cerebrovascular disease: Secondary | ICD-10-CM

## 2014-04-02 DIAGNOSIS — J45909 Unspecified asthma, uncomplicated: Secondary | ICD-10-CM

## 2014-04-02 DIAGNOSIS — I6350 Cerebral infarction due to unspecified occlusion or stenosis of unspecified cerebral artery: Secondary | ICD-10-CM

## 2014-04-02 DIAGNOSIS — G894 Chronic pain syndrome: Secondary | ICD-10-CM

## 2014-04-02 DIAGNOSIS — N390 Urinary tract infection, site not specified: Secondary | ICD-10-CM

## 2014-04-02 DIAGNOSIS — T50901A Poisoning by unspecified drugs, medicaments and biological substances, accidental (unintentional), initial encounter: Secondary | ICD-10-CM

## 2014-04-02 LAB — RAPID URINE DRUG SCREEN, HOSP PERFORMED
AMPHETAMINES: NOT DETECTED
BENZODIAZEPINES: NOT DETECTED
Barbiturates: NOT DETECTED
Cocaine: NOT DETECTED
Opiates: NOT DETECTED
TETRAHYDROCANNABINOL: NOT DETECTED

## 2014-04-02 LAB — HEMOGLOBIN A1C
HEMOGLOBIN A1C: 5.7 % — AB (ref <5.7)
MEAN PLASMA GLUCOSE: 117 mg/dL — AB (ref <117)

## 2014-04-02 LAB — MRSA PCR SCREENING: MRSA by PCR: NEGATIVE

## 2014-04-02 LAB — GLUCOSE, CAPILLARY
GLUCOSE-CAPILLARY: 134 mg/dL — AB (ref 70–99)
GLUCOSE-CAPILLARY: 159 mg/dL — AB (ref 70–99)
GLUCOSE-CAPILLARY: 162 mg/dL — AB (ref 70–99)
GLUCOSE-CAPILLARY: 154 mg/dL — AB (ref 70–99)
Glucose-Capillary: 165 mg/dL — ABNORMAL HIGH (ref 70–99)

## 2014-04-02 LAB — CBG MONITORING, ED: Glucose-Capillary: 125 mg/dL — ABNORMAL HIGH (ref 70–99)

## 2014-04-02 LAB — LIPID PANEL
Cholesterol: 194 mg/dL (ref 0–200)
HDL: 48 mg/dL (ref >39)
LDL CALC: 118 mg/dL — AB (ref 0–99)
TRIGLYCERIDES: 141 mg/dL (ref <150)
Total CHOL/HDL Ratio: 4 RATIO
VLDL: 28 mg/dL (ref 0–40)

## 2014-04-02 MED ORDER — ASPIRIN 325 MG PO TABS
325.0000 mg | ORAL_TABLET | Freq: Every day | ORAL | Status: DC
Start: 2014-04-02 — End: 2014-04-05
  Administered 2014-04-02 – 2014-04-05 (×4): 325 mg via ORAL
  Filled 2014-04-02 (×4): qty 1

## 2014-04-02 MED ORDER — CIPROFLOXACIN IN D5W 400 MG/200ML IV SOLN: 400.0000 mg | INTRAVENOUS | Status: DC

## 2014-04-02 MED ORDER — LORAZEPAM 2 MG/ML IJ SOLN
1.0000 mg | Freq: Once | INTRAMUSCULAR | Status: AC
  Administered 2014-04-02: 1 mg via INTRAVENOUS
  Filled 2014-04-02: qty 1

## 2014-04-02 MED ORDER — CLONIDINE HCL 0.1 MG PO TABS
0.1000 mg | ORAL_TABLET | Freq: Once | ORAL | Status: AC
  Administered 2014-04-02: 0.1 mg via ORAL
  Filled 2014-04-02: qty 1

## 2014-04-02 MED ORDER — HYDROMORPHONE HCL PF 1 MG/ML IJ SOLN
INTRAMUSCULAR | Status: AC
  Administered 2014-04-02: 0.5 mg via INTRAVENOUS
  Filled 2014-04-02: qty 1

## 2014-04-02 MED ORDER — ASPIRIN 300 MG RE SUPP
300.0000 mg | Freq: Every day | RECTAL | Status: DC
  Filled 2014-04-02 (×2): qty 1

## 2014-04-02 MED ORDER — CIPROFLOXACIN IN D5W 400 MG/200ML IV SOLN
400.0000 mg | Freq: Two times a day (BID) | INTRAVENOUS | Status: DC
  Administered 2014-04-02 – 2014-04-03 (×3): 400 mg via INTRAVENOUS
  Filled 2014-04-02 (×4): qty 200

## 2014-04-02 MED ORDER — LORAZEPAM 2 MG/ML IJ SOLN
0.5000 mg | Freq: Four times a day (QID) | INTRAMUSCULAR | Status: DC | PRN
  Administered 2014-04-02 (×2): 1 mg via INTRAVENOUS
  Administered 2014-04-03: 0.5 mg via INTRAVENOUS
  Administered 2014-04-03: 1 mg via INTRAVENOUS
  Administered 2014-04-04: 0.5 mg via INTRAVENOUS
  Filled 2014-04-02 (×5): qty 1

## 2014-04-02 MED ORDER — DIPHENHYDRAMINE HCL 50 MG/ML IJ SOLN: 25.0000 mg | Freq: Four times a day (QID) | INTRAMUSCULAR | Status: DC | PRN

## 2014-04-02 MED ORDER — ALBUTEROL SULFATE (2.5 MG/3ML) 0.083% IN NEBU: 2.5000 mg | INHALATION_SOLUTION | Freq: Four times a day (QID) | RESPIRATORY_TRACT | Status: DC | PRN

## 2014-04-02 MED ORDER — SENNOSIDES-DOCUSATE SODIUM 8.6-50 MG PO TABS
1.0000 | ORAL_TABLET | Freq: Every evening | ORAL | Status: DC | PRN
  Filled 2014-04-02: qty 1

## 2014-04-02 MED ORDER — HYDROMORPHONE HCL PF 1 MG/ML IJ SOLN
0.5000 mg | Freq: Four times a day (QID) | INTRAMUSCULAR | Status: DC | PRN
  Administered 2014-04-02 (×2): 0.5 mg via INTRAVENOUS
  Filled 2014-04-02: qty 1

## 2014-04-02 MED ORDER — INSULIN ASPART 100 UNIT/ML ~~LOC~~ SOLN
0.0000 [IU] | SUBCUTANEOUS | Status: DC
  Administered 2014-04-02: 1 [IU] via SUBCUTANEOUS
  Administered 2014-04-02: 2 [IU] via SUBCUTANEOUS
  Administered 2014-04-02: 3 [IU] via SUBCUTANEOUS
  Administered 2014-04-02: 2 [IU] via SUBCUTANEOUS
  Administered 2014-04-03: 10:00:00 via SUBCUTANEOUS
  Administered 2014-04-03 (×2): 2 [IU] via SUBCUTANEOUS

## 2014-04-02 NOTE — Progress Notes (Signed)
Pt arrived back from MRI-unable to have scan d/t pt moving even with Ativan given. Notified MD. Will continue to monitor.

## 2014-04-02 NOTE — H&P (Addendum)
[facility] History and Physical  [name] UPJ:[id number] DOB: 1966/07/30 DOA: 04/01/2014  Referring physician:  PCP: [name], MD  Specialists:   Chief Complaint: Unresponsive  HPI: [name] is a 48 y.o. female with a history of COPD, Chronic Pain and Neuropathy, and Questionable MS who was brought to the ED emergently after being found by her husband around 9 pm  unresponsive on the floor.  She was found to be hypoxic by EMS with O2 saturations of 40 % and was placed on supplemental O2 with improvement.  In the ED when she arrived she was given Narcan IV X1 dose and became alert. On Interview, she adamantly denied any suicidal ideation or intent.  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  There was no intracranial hemorrhage seen on the CT scan.   She was referred for medical admission.      Per her medical records, MultipleSclerosis is listed as on e of her diagnosis, however, she and her husband are not clear on her actual diagnosis.   Her husband reports that what happened tonight happens about once a year.      Review of Systems:  Constitutional: No Weight Loss, No Weight Gain, Night Sweats, Fevers, Chills, Fatigue, +Generalized Weakness HEENT: No Headaches, Difficulty Swallowing,Tooth/Dental Problems,Sore Throat,  No Sneezing, Rhinitis, Ear Ache, Nasal Congestion, or Post Nasal Drip,  Cardio-vascular:  No Chest pain, Orthopnea, PND, Edema in lower extremities, Anasarca, Dizziness, Palpitations  Resp: No Dyspnea, No DOE, No Cough, No Hemoptysis, No Wheezing.    GI: No Heartburn, Indigestion, Abdominal Pain, Nausea, Vomiting, Diarrhea, Change in Bowel Habits,  Loss of Appetite  GU: No Dysuria, Change in Color of Urine, No Urgency or Frequency.  No flank pain.  Musculoskeletal: +Joint Pain , +Muscle Pain, No Decreased Range of Motion.  +Back Pain.  Neurologic: No Syncope, No  Seizures, Muscle Weakness, Paresthesia, Vision Disturbance or Loss, No Diplopia, No Vertigo, No Difficulty Walking,  Skin: No Rash or Lesions. Psych: No Change in Mood or Affect. No Depression or Anxiety. No Memory loss. No Confusion or Hallucinations   Past Medical History  Diagnosis Date  . Anxiety   . Depression   . COPD (chronic obstructive pulmonary disease)   . MS (multiple sclerosis)   . Fibromyalgia   . Impaired fasting glucose   . 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 Eating disorder   . Chronic low back pain   . PONV (postoperative nausea and vomiting)   . Chronic bronchitis     "yearly" (02/11/2013)  . Borderline diabetes   . GERD (gastroesophageal reflux disease)   . Migraine     "I use to" (02/11/2013)  . Arthritis     "hands & feet" (02/11/2013)  . Peripheral neuropathy     [name] 02/11/2013  . B12 deficiency anemia     [name] 02/11/2013  . Chronic pain syndrome     [name] 02/11/2013  . UTI (lower urinary tract infection) 02/11/2013    [name] 02/11/2013  . Chronic edema     BLE/notes 02/11/2013     Past Surgical History  Procedure Laterality Date  . Abdominal hysterectomy  ~ 1987; ~ 2004    "[place]; [place]" (02/11/2013)  . Cesarean section  9292; 1984; 1986  . Anterior cervical decomp/discectomy fusion  2009; 2011  . Tonsillectomy  1990's?  . Cataract extraction w/phaco  06/20/2011  Procedure: CATARACT EXTRACTION PHACO AND INTRAOCULAR LENS PLACEMENT (IOC);  Surgeon: [name] T. Gershon Crane;  Location: [facility];  Service: Ophthalmology;  Laterality: Left;  CDE 1.81  . Cataract extraction w/phaco  07/04/2011    Procedure: CATARACT EXTRACTION PHACO AND INTRAOCULAR LENS PLACEMENT (IOC);  Surgeon: [name] T. Gershon Crane;  Location: [facility];  Service: Ophthalmology;  Laterality: Right;  CDE: 1.76  . Yag laser application Right 1/88/4166    Procedure: YAG LASER APPLICATION;  Surgeon: [name] T. Gershon Crane, MD;  Location: [facility];  Service: Ophthalmology;  Laterality:  Right;  . Yag laser application Left 0/63/0160    Procedure: YAG LASER APPLICATION;  Surgeon: [name] T. Gershon Crane, MD;  Location: [facility];  Service: Ophthalmology;  Laterality: Left;  . Appendectomy         Prior to Admission medications   Medication Sig Start Date End Date Taking? Authorizing Provider  citalopram (CELEXA) 20 MG tablet Take 20 mg by mouth daily.   Yes Historical Provider, MD  clonazePAM (KLONOPIN) 0.5 MG tablet Take 0.5 mg by mouth 2 (two) times daily as needed for anxiety.   Yes Historical Provider, MD  cyclobenzaprine (FLEXERIL) 10 MG tablet Take 10 mg by mouth at bedtime.   Yes Historical Provider, MD  LYRICA 150 MG capsule Take 150 mg by mouth 2 (two) times daily. 03/23/14  Yes Historical Provider, MD  potassium chloride (K-DUR) 10 MEQ tablet Take 20 mEq by mouth daily.   Yes Historical Provider, MD  torsemide (DEMADEX) 20 MG tablet Take 20 mg by mouth daily. 04/03/13 04/03/14 Yes Historical Provider, MD  albuterol (PROVENTIL HFA;VENTOLIN HFA) 108 (90 BASE) MCG/ACT inhaler Inhale 2 puffs into the lungs every 6 (six) hours as needed. COPD 03/13/14   Kathyrn Drown, MD  gabapentin (NEURONTIN) 300 MG capsule Take 1 capsule (300 mg total) by mouth 3 (three) times daily. Start with one twice a day for first one to 2 weeks 04/01/14   Kathyrn Drown, MD  Multiple Vitamins-Calcium (ONE-A-DAY WOMENS PO) Take 1 tablet by mouth every morning.     Historical Provider, MD  oxyCODONE-acetaminophen (PERCOCET) 10-325 MG per tablet Take 1 tablet by mouth every 6 (six) hours as needed for pain (no greater than 4 per day. don't take if drowsy). 03/13/14   Kathyrn Drown, MD    Allergies  Allergen Reactions  . Ambien [Zolpidem Tartrate]   . Ceftin [Cefuroxime Axetil] Nausea And Vomiting  . Hctz [Hydrochlorothiazide]     Urinary retention  . Hydrocodone Nausea Only  . Lodine [Etodolac] Hives  . Promethazine Other (See Comments)    Hallucinations.   . Roxicodone [Oxycodone Hcl] Swelling  . Codeine  Rash  . Latex Rash  . Penicillins Rash     Social History:  reports that she has been smoking Cigarettes.  She has a 33 pack-year smoking history. She has quit using smokeless tobacco. She reports that she does not drink alcohol or use illicit drugs.     Family History  Problem Relation Age of Onset  . Anesthesia problems Neg Hx   . Hypotension Neg Hx   . Malignant hyperthermia Neg Hx   . Pseudochol deficiency Neg Hx   . Hyperlipidemia Mother   . Heart disease Mother   . Cancer Mother     lung  . Diabetes Father   . Heart disease Father   . Cancer Maternal Grandmother     breast      Physical Exam:  GEN:  Tremulous Obese  48 y.o.  Caucasian female examined and in no acute distress; cooperative with exam Filed Vitals:   04/01/14 2200 04/02/14 0053 04/02/14 0103 04/02/14 0145  BP: 119/80 205/171 125/76 115/92  Pulse: 118 98  99  Temp: 98.2 F (36.8 C)     TempSrc: Oral     Resp: 20 20  14   Height: 4\' 9"  (1.448 m)     Weight: 71.668 kg (158 lb)     SpO2: 93% 94%  98%   Blood pressure 115/92, pulse 99, temperature 98.2 F (36.8 C), temperature source Oral, resp. rate 14, height 4\' 9"  (1.448 m), weight 71.668 kg (158 lb), SpO2 98.00%. PSYCH: She is alert and oriented x4; does not appear anxious does not appear depressed; affect is normal HEENT: Normocephalic and Atraumatic, Mucous membranes pink; PERRLA; EOM intact; Fundi:  Benign;  No scleral icterus, Nares: Patent, Oropharynx: Clear, Neck:  FROM, no cervical lymphadenopathy nor thyromegaly or carotid bruit; no JVD; Breasts:: Not examined CHEST WALL: No tenderness CHEST: Normal respiration, clear to auscultation bilaterally HEART: Regular rate and rhythm; no murmurs rubs or gallops BACK: No kyphosis or scoliosis; no CVA tenderness ABDOMEN: Positive Bowel Sounds, Obese, soft non-tender; no masses, no organomegaly, no pannus; no intertriginous candida. Rectal Exam: Not done EXTREMITIES: No cyanosis, clubbing or edema; no  ulcerations. Genitalia: not examined PULSES: 2+ and symmetric SKIN: Normal hydration no rash or ulceration CNS:  Alert and Oriented x 4,  Speech Clear,  CNII- XII intact, Sensory and Motor Function Intact,  Cerebellar Fxn Intact,  DTRs 2/4,  Gait Deferred  Vascular: pulses palpable throughout    Labs on Admission:  Basic Metabolic Panel:  Recent Labs Lab 04/01/14 2215  NA 136*  K 5.2  CL 94*  CO2 30  GLUCOSE 189*  BUN 5*  CREATININE 0.80  CALCIUM 8.8   Liver Function Tests:  Recent Labs Lab 04/01/14 2215  AST HEMOLYZED SPECIMEN, RESULTS MAY BE AFFECTED  ALT 11  ALKPHOS 140*  BILITOT <0.2*  PROT 7.5  ALBUMIN 3.3*   No results found for this basename: LIPASE, AMYLASE,  in the last 168 hours No results found for this basename: AMMONIA,  in the last 168 hours CBC:  Recent Labs Lab 04/01/14 2215  WBC 17.9*  NEUTROABS 14.7*  HGB 17.3*  HCT 51.7*  MCV 102.6*  PLT 212   Cardiac Enzymes:  Recent Labs Lab 04/01/14 2215  TROPONINI <0.30    BNP (last 3 results) No results found for this basename: PROBNP,  in the last 8760 hours CBG:  Recent Labs Lab 04/01/14 2242  GLUCAP 165*    Radiological Exams on Admission: Dg Chest 2 View  04/02/2014   CLINICAL DATA:  Shortness of breath. Altered mental status. COPD. Multiple sclerosis.  EXAM: CHEST  2 VIEW  COMPARISON:  07/23/2011  FINDINGS: Lower cervical plate and screw fixator. Cardiac and mediastinal margins appear normal. Bilateral lower lobe subsegmental atelectasis. Lungs appear otherwise clear. No pleural effusion identified.  IMPRESSION: 1. Bilateral subsegmental atelectasis in both lung bases. Otherwise negative exam.   Electronically Signed   By: Sherryl Barters M.D.   On: 04/02/2014 00:42   Ct Head Wo Contrast  04/02/2014   CLINICAL DATA:  Altered mental status. Patient found unresponsive at home. History of multiple sclerosis.  EXAM: CT HEAD WITHOUT CONTRAST  TECHNIQUE: Contiguous axial images were  obtained from the base of the skull through the vertex without intravenous contrast.  COMPARISON:  07/24/2011  FINDINGS: Despite efforts by the technologist and patient, motion  artifact is present on today's exam and could not be eliminated. This reduces exam sensitivity and specificity. New sharply defined hypodensities along the globus pallidus nuclei bilaterally favoring symmetric chronic lacunar infarcts. These are not visible in 2012.  Otherwise, the brainstem, cerebellum, cerebral peduncles, thalamus, basal ganglia, basilar cisterns, and ventricular system appear within normal limits. No intracranial hemorrhage, mass lesion, or acute CVA.  IMPRESSION: 1. Small symmetric infarcts in both globus pallidus nuclei appear to be a new compared to the prior examination. This may be incidental although there are some associations for bilateral globus pallidus infarcts including poisoning by cyanide, methamphetamine, cocaine, and opiates.   Electronically Signed   By: Sherryl Barters M.D.   On: 04/02/2014 00:50      EKG: Independently reviewed.     Assessment/Plan:   48 y.o. female with  Principal Problem:   CVA (cerebral infarction) Active Problems:   Overdose   Encephalopathy acute   Chronic pain syndrome   Leukocytosis   Hyperglycemia   Tobacco Abuse     1.   CVA -  On CT scan of Head:  Small Infarcts of both globus Pallidus Nuclei, No Intracranial Hemorrhage>> consequence of overdose, CVA Workup per Ischemic     CVA protocol ordered, and ASA RX with Benadryl PRN due to Allergy of Etodolac Neurology Dr Leonel Ramsay to be notified on arrival at [facility]    2.   Unintentional Overdose- on Home Meds,  Monitor in Stepdown Bed,  1:1 Sitter, and Psychiatric Evaluation needed.    3.   Acute Encephaloplathy- due to #2, and #1.  Alertness after Narcan given.    4.   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   Monitor and     restart in AM unless contraindicated by decreased alertness.    5.   Leukocytosis-  Stress Rxn versus Early Infection, Monitor Trend.       6.   Hyperglycemia-  Monitor Glucose Levels, SSI coverage PRN and check HbA1c level.    7.   Tobacco abuse-   Nicotine patch daily.    8.  UTI- Urine C+S sent, and placed on IV Cipro q 12 hrs and adjust PRN culture Results.    9.  SCDs for DVT Prophylaxis.       Code Status:   FULL CODE Family Communication:    Husband at bedside Disposition Plan:       Stepdown Bed Inpatient  Time spent:  [facility] Pager [phone]  If 7PM-7AM, please contact night-coverage [email or web address] Password [email or web address] 04/02/2014, 2:17 AM

## 2014-04-02 NOTE — Progress Notes (Signed)
Utilization Review Completed.Dowell, Deborah T6/25/2015  

## 2014-04-02 NOTE — Progress Notes (Signed)
Echo Lab  2D Echocardiogram completed.  [name] L Nonna Renninger, RDCS 04/02/2014 10:50 AM

## 2014-04-02 NOTE — ED Notes (Signed)
Report given to [facility] from [employer].

## 2014-04-02 NOTE — Progress Notes (Signed)
Received Pt from 3S via chair into 4N 4 at 1845 with sitter accompanying. Alert, oriented, and conversant; denies suicide ideation or drug overdose. Suicide precautions observed and maintained in room. Failed RN swallow screening due to moderate coughing upon drinking via cup.. Bedside ST evalualion ordered. Son, [name] present at bedside and patient made aware of not food or drink allowed until SLP evaluation in morning. Verbalized understanding.

## 2014-04-02 NOTE — ED Notes (Signed)
Pt holding breath. Instructed to breath normally. Pt reports only taking medications she is supposed to take. Denies taking any other medications, drugs, or alcohol. Pt states she does not remember what happened.

## 2014-04-02 NOTE — Consult Note (Addendum)
Neurology Consultation Reason for Consult: Altered mental status Referring Physician: Arnoldo Morale, H.  CC: Altered mental status  History is obtained from: Patient, medical record  HPI: [name] is a 48 y.o. female with a history of chronic pain, COPD, ?MS, depression who was found down earlier tonight with an oxygen saturation of 49%. In the ER, she was given Narcan with good response and becoming more awake and interactive.  It appears that her history is very unreliable and she confabulates. When asked about the circumstances leading to her admission, she gives a story that is completely different than what the medical record indicates. I ask her if she remembers EMS was the one that brought her in she indicates that she does not remember this.     ROS: A 14 point ROS was performed and is negative except as noted in the HPI.  Past Medical History  Diagnosis Date  . Anxiety   . Depression   . COPD (chronic obstructive pulmonary disease)   . MS (multiple sclerosis)   . Fibromyalgia   . Impaired fasting glucose   . 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 Eating disorder   . Chronic low back pain   . PONV (postoperative nausea and vomiting)   . Chronic bronchitis     "yearly" (02/11/2013)  . Borderline diabetes   . GERD (gastroesophageal reflux disease)   . Migraine     "I use to" (02/11/2013)  . Arthritis     "hands & feet" (02/11/2013)  . Peripheral neuropathy     [name] 02/11/2013  . B12 deficiency anemia     [name] 02/11/2013  . Chronic pain syndrome     [name] 02/11/2013  . UTI (lower urinary tract infection) 02/11/2013    [name] 02/11/2013  . Chronic edema     BLE/notes 02/11/2013    Family History: Mother-cancer  Social History: Tob: Current smoker  Exam: Current vital signs: BP 133/112  Pulse 97  Temp(Src) 98.2 F (36.8 C) (Oral)  Resp 14  Ht 4\' 9"  (1.448 m)  Wt 71.668 kg (158 lb)  BMI 34.18 kg/m2  SpO2 97% Vital signs in last  24 hours: Temp:  [98.2 F (36.8 C)] 98.2 F (36.8 C) (06/24 2200) Pulse Rate:  [97-118] 97 (06/25 0238) Resp:  [14-20] 14 (06/25 0238) BP: (115-205)/(76-171) 133/112 mmHg (06/25 0237) SpO2:  [89 %-98 %] 97 % (06/25 0238) Weight:  [71.668 kg (158 lb)] 71.668 kg (158 lb) (06/24 2200)  General: In bed, NAD CV: Regular rate and rhythm Mental Status: Patient is awake, alert, oriented to person,  month, year. She gives the name of the hospital as "[facility]" but does not appear surprise when I indicate it is [place]. She confabulates. No signs of aphasia or neglect She is unable to spell world backwards. Cranial Nerves: II: Visual Fields are full. Pupils are equal, round, and reactive to light.  Discs are difficult to visualize. III,IV, VI: Her right eye slightly outwardly deviated compared to her left. V: Facial sensation is symmetric to temperature VII: Facial movement is symmetric.  VIII: hearing is intact to voice X: Uvula elevates symmetrically XI: Shoulder shrug is symmetric. XII: tongue is midline without atrophy or fasciculations.  Motor: She is able to move all extremities with good strength, however she has multifocal myoclonus, more prominent in the right arm but this is suppressible. Sensory: Sensation is symmetric to light touch and temperature in the arms and legs. Deep Tendon Reflexes: 2+ and  symmetric in the biceps and patellae.  Cerebellar: FNF impaired due to negative myoclonus bilaterally Gait: Not tested due to patient safety concerns   I have reviewed labs in epic and the results pertinent to this consultation are: No metabolic acidosis UDS-positive for benzodiazepines Negative for narcotics  I have reviewed the images obtained: CT head-bilateral globus pallidus hypodensities  Impression: 48 year old female with bilateral globus pallidus injury in the setting of likely drug overdose. This pattern of injury as well described in drug overdose with narcotics,  cocaine. It is also present with carbon monoxide poisoning. Cyanide has also been described as causing this injury, but she has no other clinical signs and no metabolic acidosis to suggest cyanide poisoning. The response to Narcan described would strongly suggest narcotics as an etiology.  Recommendations: 1) carboxyhemoglobin 2) MRI brain to look for other signs of injury 3) will continue to follow  Roland Rack, MD [facility] [phone]  If 7pm- 7am, please page neurology on call as listed in [place].

## 2014-04-02 NOTE — ED Notes (Signed)
Pt continues to hold breath making O2 reading inaccurate. Pt instructed again to breath normally. O2 raises to WDL when pt breaths normally.

## 2014-04-02 NOTE — Progress Notes (Signed)
[facility] PROGRESS NOTE  [name] EGB:[id number] DOB: 10/24/1965 DOA: 04/01/2014 PCP: [name], MD  Assessment/Plan: Principal Problem:   CVA (cerebral infarction) Active Problems:   Encephalopathy acute   Chronic pain syndrome   Reactive airway disease   UTI (urinary tract infection)   Overdose   Leukocytosis   1. Altered mental status secondary to hypoxia?, abnormal CT scan showing bilateral globus pallidus hypodensities, no intracranial hemorrhage, history of multiple sclerosis. Unable to do MRI of the brain because of agitation despite receiving Ativan. Patient oriented to self and place. Suspected drug overdose although urine drug screen only positive for benzodiazepines and negative for narcotics. Carboxyhemoglobin is pending. Appreciate neurology consult. 2. . Hypoxia resolved currently 97% on oxygen, chest x-ray shows atelectasis or pneumonia, or prior history of DVT or PE, resolved after receiving Narcan. Patient denies any intentional drug overdose. She does have a history of COPD. Continue when necessary Ventolin. 3. Unintentional overdose, continue one-on-one sitter by the bedside 4. UTI continue ciprofloxacin, likely also because of leukocytosis 5. Hyperglycemia, A1c pending continue sliding scale insulin 6.    Code Status: full Family Communication: family updated about patient's clinical progress Disposition Plan:  As above    Brief narrative: 48 y.o. female with a h/o COPD, MS, and depression who was brought to the ED via EMS presenting with altered mental status. Per EMS, her partner found her unresponsive today. When EMS arrived, her oxygen saturation was 49%. In the ED on 4 liters of oxygen, her saturation is 93%. [name] denies drug or alcohol usage today. Per the nurse, Her hsband keeps her narcotic medication locked up, but he is unsure if she accessed the medicine. She states she has not taken her medication, including flexeril and percocet,  today. She denies suicidal ideations or homicidal ideations. Denies any current medical complaints.   Consultants:  Neurology  Procedures:  None  Antibiotics: *None HPI/Subjective: Patient agitated ,fidgeting, hypertensive early this morning, denies any chest pain any shortness of breath, sitter by the bedside  Objective: Filed Vitals:   04/02/14 0238 04/02/14 0814 04/02/14 1101 04/02/14 1146  BP:  132/95    Pulse: 97     Temp:  100.3 F (37.9 C) 99 F (37.2 C) 98.2 F (36.8 C)  TempSrc:  Oral Oral Oral  Resp: 14     Height:      Weight:      SpO2: 97% 97%      Intake/Output Summary (Last 24 hours) at 04/02/14 1323 Last data filed at 04/02/14 1322  Gross per 24 hour  Intake    480 ml  Output    875 ml  Net   -395 ml    Exam: PSYCH: She is alert and oriented x4; does not appear anxious does not appear depressed; affect is normal  HEENT: Normocephalic and Atraumatic, Mucous membranes pink; PERRLA; EOM intact; Fundi: Benign; No scleral icterus, Nares: Patent, Oropharynx: Clear, Neck: FROM, no cervical lymphadenopathy nor thyromegaly or carotid bruit; no JVD;  Breasts:: Not examined  CHEST WALL: No tenderness  CHEST: Normal respiration, clear to auscultation bilaterally  HEART: Regular rate and rhythm; no murmurs rubs or gallops  BACK: No kyphosis or scoliosis; no CVA tenderness  ABDOMEN: Positive Bowel Sounds, Obese, soft non-tender; no masses, no organomegaly, no pannus; no intertriginous candida.  Rectal Exam: Not done  EXTREMITIES: No cyanosis, clubbing or edema; no ulcerations.  Genitalia: not examined  PULSES: 2+ and symmetric  SKIN: Normal hydration no rash or ulceration  CNS: Alert and Oriented x 4, Speech Clear, CNII- XII intact, Sensory and Motor Function Intact, Cerebellar Fxn Intact, DTRs 2/4, Gait Deferred  Vascular: pulses palpable throughout     Data Reviewed: Basic Metabolic Panel:  Recent Labs Lab 04/01/14 2215  NA 136*  K 5.2  CL 94*   CO2 30  GLUCOSE 189*  BUN 5*  CREATININE 0.80  CALCIUM 8.8    Liver Function Tests:  Recent Labs Lab 04/01/14 2215  AST HEMOLYZED SPECIMEN, RESULTS MAY BE AFFECTED  ALT 11  ALKPHOS 140*  BILITOT <0.2*  PROT 7.5  ALBUMIN 3.3*   No results found for this basename: LIPASE, AMYLASE,  in the last 168 hours No results found for this basename: AMMONIA,  in the last 168 hours  CBC:  Recent Labs Lab 04/01/14 2215  WBC 17.9*  NEUTROABS 14.7*  HGB 17.3*  HCT 51.7*  MCV 102.6*  PLT 212    Cardiac Enzymes:  Recent Labs Lab 04/01/14 2215  TROPONINI <0.30   BNP (last 3 results) No results found for this basename: PROBNP,  in the last 8760 hours   CBG:  Recent Labs Lab 04/01/14 2242 04/02/14 0239 04/02/14 0810 04/02/14 1113  GLUCAP 165* 125* 134* 159*    Recent Results (from the past 240 hour(s))  MRSA PCR SCREENING     Status: None   Collection Time    04/02/14  6:30 AM      Result Value Ref Range Status   MRSA by PCR NEGATIVE  NEGATIVE Final   Comment:            The GeneXpert MRSA Assay (FDA     approved for NASAL specimens     only), is one component of a     comprehensive MRSA colonization     surveillance program. It is not     intended to diagnose MRSA     infection nor to guide or     monitor treatment for     MRSA infections.     Studies: Dg Chest 2 View  04/02/2014   CLINICAL DATA:  Shortness of breath. Altered mental status. COPD. Multiple sclerosis.  EXAM: CHEST  2 VIEW  COMPARISON:  07/23/2011  FINDINGS: Lower cervical plate and screw fixator. Cardiac and mediastinal margins appear normal. Bilateral lower lobe subsegmental atelectasis. Lungs appear otherwise clear. No pleural effusion identified.  IMPRESSION: 1. Bilateral subsegmental atelectasis in both lung bases. Otherwise negative exam.   Electronically Signed   By: Sherryl Barters M.D.   On: 04/02/2014 00:42   Ct Head Wo Contrast  04/02/2014   CLINICAL DATA:  Altered mental status.  Patient found unresponsive at home. History of multiple sclerosis.  EXAM: CT HEAD WITHOUT CONTRAST  TECHNIQUE: Contiguous axial images were obtained from the base of the skull through the vertex without intravenous contrast.  COMPARISON:  07/24/2011  FINDINGS: Despite efforts by the technologist and patient, motion artifact is present on today's exam and could not be eliminated. This reduces exam sensitivity and specificity. New sharply defined hypodensities along the globus pallidus nuclei bilaterally favoring symmetric chronic lacunar infarcts. These are not visible in 2012.  Otherwise, the brainstem, cerebellum, cerebral peduncles, thalamus, basal ganglia, basilar cisterns, and ventricular system appear within normal limits. No intracranial hemorrhage, mass lesion, or acute CVA.  IMPRESSION: 1. Small symmetric infarcts in both globus pallidus nuclei appear to be a new compared to the prior examination. This may be incidental although there are some associations for bilateral globus pallidus  infarcts including poisoning by cyanide, methamphetamine, cocaine, and opiates.   Electronically Signed   By: Sherryl Barters M.D.   On: 04/02/2014 00:50    Scheduled Meds: . aspirin  300 mg Rectal Daily   Or  . aspirin  325 mg Oral Daily  . ciprofloxacin  400 mg Intravenous Q12H  . insulin aspart  0-9 Units Subcutaneous 6 times per day   Continuous Infusions:   Principal Problem:   CVA (cerebral infarction) Active Problems:   Encephalopathy acute   Chronic pain syndrome   Reactive airway disease   UTI (urinary tract infection)   Overdose   Leukocytosis    Time spent: 40 minutes   [facility] Pager [phone]. If 8PM-8AM, please contact night-coverage at [email or web address], password [facility] 04/02/2014, 1:23 PM  LOS: 1 day

## 2014-04-03 ENCOUNTER — Inpatient Hospital Stay (HOSPITAL_COMMUNITY): Payer: BC Managed Care – PPO

## 2014-04-03 DIAGNOSIS — F39 Unspecified mood [affective] disorder: Secondary | ICD-10-CM

## 2014-04-03 LAB — COMPREHENSIVE METABOLIC PANEL
ALBUMIN: 3.6 g/dL (ref 3.5–5.2)
ALK PHOS: 127 U/L — AB (ref 39–117)
ALT: 13 U/L (ref 0–35)
AST: 28 U/L (ref 0–37)
BUN: 6 mg/dL (ref 6–23)
CO2: 29 mEq/L (ref 19–32)
Calcium: 9.9 mg/dL (ref 8.4–10.5)
Chloride: 95 mEq/L — ABNORMAL LOW (ref 96–112)
Creatinine, Ser: 0.61 mg/dL (ref 0.50–1.10)
GFR calc Af Amer: >90 mL/min (ref >90)
GFR calc non Af Amer: >90 mL/min (ref >90)
Glucose, Bld: 142 mg/dL — ABNORMAL HIGH (ref 70–99)
POTASSIUM: 3.8 meq/L (ref 3.7–5.3)
Sodium: 140 mEq/L (ref 137–147)
TOTAL PROTEIN: 7.9 g/dL (ref 6.0–8.3)
Total Bilirubin: 0.7 mg/dL (ref 0.3–1.2)

## 2014-04-03 LAB — GLUCOSE, CAPILLARY
GLUCOSE-CAPILLARY: 153 mg/dL — AB (ref 70–99)
Glucose-Capillary: 116 mg/dL — ABNORMAL HIGH (ref 70–99)
Glucose-Capillary: 160 mg/dL — ABNORMAL HIGH (ref 70–99)
Glucose-Capillary: 169 mg/dL — ABNORMAL HIGH (ref 70–99)
Glucose-Capillary: 170 mg/dL — ABNORMAL HIGH (ref 70–99)

## 2014-04-03 LAB — CBC
HCT: 48.7 % — ABNORMAL HIGH (ref 36.0–46.0)
Hemoglobin: 16.4 g/dL — ABNORMAL HIGH (ref 12.0–15.0)
MCH: 32.8 pg (ref 26.0–34.0)
MCHC: 33.7 g/dL (ref 30.0–36.0)
MCV: 97.4 fL (ref 78.0–100.0)
Platelets: 248 10*3/uL (ref 150–400)
RBC: 5 MIL/uL (ref 3.87–5.11)
RDW: 12.6 % (ref 11.5–15.5)
WBC: 14.1 10*3/uL — ABNORMAL HIGH (ref 4.0–10.5)

## 2014-04-03 LAB — VITAMIN B12: VITAMIN B 12: 1256 pg/mL — AB (ref 211–911)

## 2014-04-03 LAB — TSH: TSH: 0.557 u[IU]/mL (ref 0.350–4.500)

## 2014-04-03 LAB — AMMONIA: AMMONIA: 29 umol/L (ref 11–60)

## 2014-04-03 MED ORDER — NICOTINE 14 MG/24HR TD PT24
14.0000 mg | MEDICATED_PATCH | Freq: Every day | TRANSDERMAL | Status: DC
Start: 2014-04-03 — End: 2014-04-05
  Administered 2014-04-03 – 2014-04-05 (×3): 14 mg via TRANSDERMAL
  Filled 2014-04-03 (×3): qty 1

## 2014-04-03 MED ORDER — CIPROFLOXACIN HCL 500 MG PO TABS
500.0000 mg | ORAL_TABLET | Freq: Two times a day (BID) | ORAL | Status: DC
  Administered 2014-04-03 – 2014-04-05 (×4): 500 mg via ORAL
  Filled 2014-04-03 (×4): qty 1

## 2014-04-03 MED ORDER — OXYCODONE-ACETAMINOPHEN 5-325 MG PO TABS
2.0000 | ORAL_TABLET | Freq: Three times a day (TID) | ORAL | Status: DC | PRN
  Administered 2014-04-03 – 2014-04-05 (×6): 2 via ORAL
  Filled 2014-04-03 (×7): qty 2

## 2014-04-03 MED ORDER — CLONAZEPAM 0.5 MG PO TABS
0.5000 mg | ORAL_TABLET | Freq: Two times a day (BID) | ORAL | Status: DC
  Administered 2014-04-03 – 2014-04-05 (×5): 0.5 mg via ORAL
  Filled 2014-04-03 (×5): qty 1

## 2014-04-03 MED ORDER — FOLIC ACID 1 MG PO TABS
1.0000 mg | ORAL_TABLET | Freq: Every day | ORAL | Status: DC
  Administered 2014-04-03 – 2014-04-05 (×2): 1 mg via ORAL
  Filled 2014-04-03 (×2): qty 1

## 2014-04-03 MED ORDER — ENOXAPARIN SODIUM 40 MG/0.4ML ~~LOC~~ SOLN
40.0000 mg | SUBCUTANEOUS | Status: DC
  Administered 2014-04-03 – 2014-04-05 (×3): 40 mg via SUBCUTANEOUS
  Filled 2014-04-03 (×3): qty 0.4

## 2014-04-03 MED ORDER — LORAZEPAM 2 MG/ML IJ SOLN
2.0000 mg | Freq: Once | INTRAMUSCULAR | Status: AC
  Administered 2014-04-03: 2 mg via INTRAVENOUS
  Filled 2014-04-03 (×2): qty 1

## 2014-04-03 MED ORDER — SODIUM CHLORIDE 0.9 % IV SOLN
INTRAVENOUS | Status: DC
  Administered 2014-04-03: 1000 mL via INTRAVENOUS
  Administered 2014-04-03: 10:00:00 via INTRAVENOUS

## 2014-04-03 NOTE — Evaluation (Signed)
Clinical/Bedside Swallow Evaluation Patient Details  Name: ARILYNN BLAKENEY MRN: [id number] Date of Birth: 1966/02/03  Today's Date: 04/03/2014 Time: 4010-2725 SLP Time Calculation (min): 31 min  Past Medical History:  Past Medical History  Diagnosis Date  . Anxiety   . Depression   . COPD (chronic obstructive pulmonary disease)   . MS (multiple sclerosis)   . Fibromyalgia   . Impaired fasting glucose   . 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 Eating disorder   . Chronic low back pain   . PONV (postoperative nausea and vomiting)   . Chronic bronchitis     "yearly" (02/11/2013)  . Borderline diabetes   . GERD (gastroesophageal reflux disease)   . Migraine     "I use to" (02/11/2013)  . Arthritis     "hands & feet" (02/11/2013)  . Peripheral neuropathy     [name] 02/11/2013  . B12 deficiency anemia     [name] 02/11/2013  . Chronic pain syndrome     [name] 02/11/2013  . UTI (lower urinary tract infection) 02/11/2013    [name] 02/11/2013  . Chronic edema     BLE/notes 02/11/2013   Past Surgical History:  Past Surgical History  Procedure Laterality Date  . Abdominal hysterectomy  ~ 1987; ~ 2004    "[place]; [place]" (02/11/2013)  . Cesarean section  3664; 1984; 1986  . Anterior cervical decomp/discectomy fusion  2009; 2011  . Tonsillectomy  1990's?  . Cataract extraction w/phaco  06/20/2011    Procedure: CATARACT EXTRACTION PHACO AND INTRAOCULAR LENS PLACEMENT (IOC);  Surgeon: [name] T. Gershon Crane;  Location: [facility];  Service: Ophthalmology;  Laterality: Left;  CDE 1.81  . Cataract extraction w/phaco  07/04/2011    Procedure: CATARACT EXTRACTION PHACO AND INTRAOCULAR LENS PLACEMENT (IOC);  Surgeon: [name] T. Gershon Crane;  Location: [facility];  Service: Ophthalmology;  Laterality: Right;  CDE: 1.76  . Yag laser application Right 01/09/4741    Procedure: YAG LASER APPLICATION;  Surgeon: [name] T. Gershon Crane, MD;  Location: [facility];  Service: Ophthalmology;  Laterality: Right;  .  Yag laser application Left 5/95/6387    Procedure: YAG LASER APPLICATION;  Surgeon: [name] T. Gershon Crane, MD;  Location: [facility];  Service: Ophthalmology;  Laterality: Left;  . Appendectomy     HPI:  VALJEAN RUPPEL is a 48 y.o. female with a history of chronic pain, COPD, ?MS, depression who was found down earlier tonight with an oxygen saturation of 49%. In the ER, she was given Narcan with good response and becoming more awake and interactive. MD reports "bilateral globus pallidus injury in the setting of likely drug overdose. This pattern of injury as well described in drug overdose with narcotics, cocaine." Also notes that pt confabulates.  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  BSE ordered as pt failed stroke swallow screen.     Assessment / Plan / Recommendation Clinical Impression  Pt presents with many indications of oropharyngeal dysphagia and has complex medical hx including pna June 2014 requiring trach and peg (peg removed 06/2013 per [facility] notes).  Pt also with h/o ACDF at C5-C6 that may impact cervical esophageal clearance.  Cough noted with approx 20% of boluses with poor pt awareness- worse with thinner liquids and solid cracker.  SLP can not rule out aspiration, significant dysphagia at bedside and therefore recommend MBS.  Educated pt to reasoning for MBS recommendation and medicine administration with applesauce pending test.  Anticipate pt will be able to consume po diet  but may be modified.  Pt does admit to h/o choking on foods more than drinks prior to admission.  She was agreeable to [facility] today and plan.  Orders obtained = thanks.     Aspiration Risk  Moderate    Diet Recommendation NPO except meds (medicine with applesauce)   Medication Administration: Whole meds with puree Postural Changes and/or Swallow Maneuvers: Seated upright 90 degrees;Upright 30-60 min after meal    Other  Recommendations Recommended Consults: MBS   Follow Up Recommendations   (tbd)     Frequency and Duration min 2x/week  2 weeks   Pertinent Vitals/Pain Afebrile, decreased     Swallow Study Prior Functional Status   see hhx    General Date of Onset: 04/03/14 HPI: PALMIRA STICKLE is a 48 y.o. female with a history of chronic pain, COPD, ?MS, depression who was found down earlier tonight with an oxygen saturation of 49%. In the ER, she was given Narcan with good response and becoming more awake and interactive. MD reports "bilateral globus pallidus injury in the setting of likely drug overdose. This pattern of injury as well described in drug overdose with narcotics, cocaine." Also notes that pt confabulates.    BSE ordered as pt failed stroke swallow screen.    Type of Study: Bedside swallow evaluation Diet Prior to this Study: NPO Temperature Spikes Noted: No Respiratory Status: Nasal cannula History of Recent Intubation: No Behavior/Cognition: Alert;Cooperative;Pleasant mood Oral Cavity - Dentition: Dentures, top (lowers essentially lack of dentition- nubs) Self-Feeding Abilities: Able to feed self Patient Positioning: Upright in bed Baseline Vocal Quality: Clear Volitional Cough: Strong Volitional Swallow: Able to elicit    Oral/Motor/Sensory Function Overall Oral Motor/Sensory Function: Appears within functional limits for tasks assessed (appears ataxic but no focal CN deficits)   Ice Chips Ice chips: Impaired Presentation: Spoon Oral Phase Impairments: Reduced lingual movement/coordination;Impaired anterior to posterior transit Oral Phase Functional Implications: Prolonged oral transit Pharyngeal Phase Impairments: Cough - Immediate   Thin Liquid Thin Liquid: Impaired Presentation: Straw;Cup;Self Fed Oral Phase Impairments: Impaired anterior to posterior transit;Reduced lingual movement/coordination Oral Phase Functional Implications: Prolonged oral transit Pharyngeal  Phase Impairments: Cough - Delayed;Cough - Immediate    Nectar Thick Nectar Thick  Liquid: Impaired Presentation: Cup;Self Fed;Spoon;Straw Oral Phase Impairments: Impaired anterior to posterior transit Oral phase functional implications: Prolonged oral transit Pharyngeal Phase Impairments: Cough - Delayed   Honey Thick Honey Thick Liquid: Not tested   Puree Puree: Impaired Presentation: Self Fed;Spoon Oral Phase Impairments: Reduced lingual movement/coordination Oral Phase Functional Implications: Prolonged oral transit Pharyngeal Phase Impairments: Cough - Delayed Other Comments: delayed cough x1 of approx 7 boluses   Solid   GO    Solid: Impaired Presentation: Self Fed Oral Phase Impairments: Reduced lingual movement/coordination;Impaired anterior to posterior transit Oral Phase Functional Implications: Other (comment) (minimal stasis in oral cavity posterior right without awareness) Pharyngeal Phase Impairments: Cough - Delayed       Luanna Salk, [place] [facility] SLP [phone]

## 2014-04-03 NOTE — Progress Notes (Signed)
Patient very anxious, confused, and sweaty. Cannot sit still and has not been to sleep at all as of 3:23 am. Patient given PRN dose of Ativan at 1:50 am and medication had no effect on patient. This patient is calling out for "mama" and unaware of her surroundings. Sitter at bedside. Will continue to monitor and reorient patient. Burnell Blanks, RN

## 2014-04-03 NOTE — Procedures (Signed)
Objective Swallowing Evaluation: Modified Barium Swallowing Study  Patient Details  Name: [name] MRN: [id number] Date of Birth: Jan 22, 1966  Today's Date: 04/03/2014 Time: 2694-8546 SLP Time Calculation (min): 33 min  Past Medical History:  Past Medical History  Diagnosis Date  . Anxiety   . Depression   . COPD (chronic obstructive pulmonary disease)   . MS (multiple sclerosis)   . Fibromyalgia   . Impaired fasting glucose   . 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 Eating disorder   . Chronic low back pain   . PONV (postoperative nausea and vomiting)   . Chronic bronchitis     "yearly" (02/11/2013)  . Borderline diabetes   . GERD (gastroesophageal reflux disease)   . Migraine     "I use to" (02/11/2013)  . Arthritis     "hands & feet" (02/11/2013)  . Peripheral neuropathy     [name] 02/11/2013  . B12 deficiency anemia     [name] 02/11/2013  . Chronic pain syndrome     [name] 02/11/2013  . UTI (lower urinary tract infection) 02/11/2013    [name] 02/11/2013  . Chronic edema     BLE/notes 02/11/2013   Past Surgical History:  Past Surgical History  Procedure Laterality Date  . Abdominal hysterectomy  ~ 1987; ~ 2004    "[place]; [place]" (02/11/2013)  . Cesarean section  2703; 1984; 1986  . Anterior cervical decomp/discectomy fusion  2009; 2011  . Tonsillectomy  1990's?  . Cataract extraction w/phaco  06/20/2011    Procedure: CATARACT EXTRACTION PHACO AND INTRAOCULAR LENS PLACEMENT (IOC);  Surgeon: [name] T. Gershon Crane;  Location: [facility];  Service: Ophthalmology;  Laterality: Left;  CDE 1.81  . Cataract extraction w/phaco  07/04/2011    Procedure: CATARACT EXTRACTION PHACO AND INTRAOCULAR LENS PLACEMENT (IOC);  Surgeon: [name] T. Gershon Crane;  Location: [facility];  Service: Ophthalmology;  Laterality: Right;  CDE: 1.76  . Yag laser application Right 5/00/9381    Procedure: YAG LASER APPLICATION;  Surgeon: [name] T. Gershon Crane, MD;  Location: [facility];  Service:  Ophthalmology;  Laterality: Right;  . Yag laser application Left 06/06/9370    Procedure: YAG LASER APPLICATION;  Surgeon: [name] T. Gershon Crane, MD;  Location: [facility];  Service: Ophthalmology;  Laterality: Left;  . Appendectomy     HPI:  [name] is a 48 y.o. female with a history of chronic pain, COPD, ?MS, depression who was found down earlier tonight with an oxygen saturation of 49%. In the ER, she was given Narcan with good response and becoming more awake and interactive. MD reports "bilateral globus pallidus injury in the setting of likely drug overdose. This pattern of injury as well described in drug overdose with narcotics, cocaine." Also notes that pt confabulates.  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  BSE ordered as pt failed stroke swallow screen.       Assessment / Plan / Recommendation Clinical Impression  Dysphagia Diagnosis: Within Functional Limits Clinical impression: Pt demonstrated an oropharyngeal swallow essentially within normal limits. No delay noted; pt does consistently swallow avg of 2x per bite/ sip but no significant residue noted, despite ACDF. Given these results pt's risk of aspiration is none-mild. Recommend initiating regular diet, thin liquids. Pt did demonstrate some difficulties with barium pill; would advise starting meds with liquids and switch to puree if difficulties arise. Given history of GERD, also recommend reflux precautions- alternating food with liquid, sit upright 30-60 mins after meal; worth noting however  that no cervical esophageal motility issues noted on MBS. ST will continue to follow for cognitive needs.    Treatment Recommendation  Therapy as outlined in treatment plan below    Diet Recommendation Regular;Thin liquid   Liquid Administration via: Cup;Straw Medication Administration: Whole meds with liquid Supervision: Patient able to self feed;Intermittent supervision to cue for compensatory strategies Compensations: Slow  rate;Small sips/bites;Follow solids with liquid Postural Changes and/or Swallow Maneuvers: Seated upright 90 degrees;Upright 30-60 min after meal    Other  Recommendations Recommended Consults: MBS Oral Care Recommendations: Oral care BID   Follow Up Recommendations  Inpatient Rehab;Home health SLP;Other (comment) (CIR or home health would be appropriate at PLOF)    Frequency and Duration min 2x/week  2 weeks   Pertinent Vitals/Pain n/a    SLP Swallow Goals     General HPI: [name] is a 48 y.o. female with a history of chronic pain, COPD, ?MS, depression who was found down earlier tonight with an oxygen saturation of 49%. In the ER, she was given Narcan with good response and becoming more awake and interactive. MD reports "bilateral globus pallidus injury in the setting of likely drug overdose. This pattern of injury as well described in drug overdose with narcotics, cocaine." Also notes that pt confabulates.  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  BSE ordered as pt failed stroke swallow screen.   Type of Study: Modified Barium Swallowing Study Reason for Referral: Objectively evaluate swallowing function Previous Swallow Assessment: BSE- pt then made NPO Diet Prior to this Study: NPO Temperature Spikes Noted: No Respiratory Status: Nasal cannula History of Recent Intubation: No Behavior/Cognition: Alert;Cooperative;Pleasant mood Oral Cavity - Dentition: Dentures, top Oral Motor / Sensory Function: Within functional limits Self-Feeding Abilities: Able to feed self Patient Positioning: Upright in chair Baseline Vocal Quality: Clear Anatomy: Other (Comment) (visualized ACDF) Pharyngeal Secretions: Not observed secondary MBS    Reason for Referral Objectively evaluate swallowing function   Oral Phase Oral Preparation/Oral Phase Oral Phase: WFL   Pharyngeal Phase Pharyngeal Phase Pharyngeal Phase: Within functional limits  Cervical Esophageal  Phase    GO    Cervical Esophageal Phase Cervical Esophageal Phase: Tammy Junker, MA, CCC-SLP 04/03/2014, 2:33 PM

## 2014-04-03 NOTE — Evaluation (Signed)
Speech Language Pathology Evaluation Patient Details Name: [name] MRN: [id number] DOB: 11-26-1965 Today's Date: 04/03/2014 Time: 3614-4315 SLP Time Calculation (min): 33 min  Problem List:  Patient Active Problem List   Diagnosis Date Noted  . Overdose 04/02/2014  . CVA (cerebral infarction) 04/02/2014  . Leukocytosis 04/02/2014  . Hypertriglyceridemia 12/11/2013  . Stiffness of joint, not elsewhere classified, ankle and foot 11/12/2013  . Lack of coordination 11/03/2013  . Pernicious anemia 09/12/2013  . Hyperglycemia 09/12/2013  . Elevated transaminase level 09/12/2013  . Weakness 09/12/2013  . Ataxia 09/09/2013  . Difficulty in walking(719.7) 09/09/2013  . Weakness of both legs 09/09/2013  . UTI (urinary tract infection) 02/11/2013  . Generalized weakness 02/11/2013  . Tobacco abuse 01/14/2013  . Bilateral leg edema 01/14/2013  . Pedal edema 01/13/2013  . Chronic pain syndrome 12/30/2012  . Reactive airway disease 12/30/2012  . Encephalopathy acute 07/25/2011  . 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   Past Medical History:  Past Medical History  Diagnosis Date  . Anxiety   . Depression   . COPD (chronic obstructive pulmonary disease)   . MS (multiple sclerosis)   . Fibromyalgia   . Impaired fasting glucose   . 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 Eating disorder   . Chronic low back pain   . PONV (postoperative nausea and vomiting)   . Chronic bronchitis     "yearly" (02/11/2013)  . Borderline diabetes   . GERD (gastroesophageal reflux disease)   . Migraine     "I use to" (02/11/2013)  . Arthritis     "hands & feet" (02/11/2013)  . Peripheral neuropathy     [name] 02/11/2013  . B12 deficiency anemia     [name] 02/11/2013  . Chronic pain syndrome     [name] 02/11/2013  . UTI (lower urinary tract infection) 02/11/2013    [name] 02/11/2013  . Chronic edema     BLE/notes 02/11/2013   Past Surgical History:  Past Surgical History   Procedure Laterality Date  . Abdominal hysterectomy  ~ 1987; ~ 2004    "[place]; [place]" (02/11/2013)  . Cesarean section  4008; 1984; 1986  . Anterior cervical decomp/discectomy fusion  2009; 2011  . Tonsillectomy  1990's?  . Cataract extraction w/phaco  06/20/2011    Procedure: CATARACT EXTRACTION PHACO AND INTRAOCULAR LENS PLACEMENT (IOC);  Surgeon: [name] T. Gershon Crane;  Location: [facility];  Service: Ophthalmology;  Laterality: Left;  CDE 1.81  . Cataract extraction w/phaco  07/04/2011    Procedure: CATARACT EXTRACTION PHACO AND INTRAOCULAR LENS PLACEMENT (IOC);  Surgeon: [name] T. Gershon Crane;  Location: [facility];  Service: Ophthalmology;  Laterality: Right;  CDE: 1.76  . Yag laser application Right 6/76/1950    Procedure: YAG LASER APPLICATION;  Surgeon: [name] T. Gershon Crane, MD;  Location: [facility];  Service: Ophthalmology;  Laterality: Right;  . Yag laser application Left 9/32/6712    Procedure: YAG LASER APPLICATION;  Surgeon: [name] T. Gershon Crane, MD;  Location: [facility];  Service: Ophthalmology;  Laterality: Left;  . Appendectomy     HPI:  [name] is a 48 y.o. female with a history of chronic pain, COPD, ?MS, depression who was found down earlier tonight with an oxygen saturation of 49%. In the ER, she was given Narcan with good response and becoming more awake and interactive. MD reports "bilateral globus pallidus injury in the setting of likely drug overdose. This pattern of injury as well described in drug overdose with narcotics, cocaine." Also notes  that pt confabulates.  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  BSE ordered as pt failed stroke swallow screen.     Assessment / Plan / Recommendation Clinical Impression  Pt's receptive/ expressive language and motor speech skills overall within normal limits for tasks assessed. In the area of cognition, pt was oriented to person, place, and situation but disoriented to time. Completed basic functional problem solving tasks with ease.  Demonstrated difficulties with divergent naming tasks (frequent repetitions/ perseverations?) and working memory/ short term memory tasks. Did not recall recommendations from bedside swallow eval earlier today. ? safety awareness/ reasoning as pt is saying "I'll get ready to walk on out of here if they try to keep me." After completing memory tasks she admitted to difficulties and reported these difficulties are new; significant other not present to confirm or deny any baseline deficits. Given decreased safety awareness, reasoning, and short term memory difficulties, speech therapy is recommended to address these areas as pt's safety could be at risk. ST will continue to follow.    SLP Assessment  Patient needs continued Speech Lanaguage Pathology Services    Follow Up Recommendations  Inpatient Rehab;Home health SLP;Other (comment) (CIR or home health would be appropriate at PLOF)    Frequency and Duration min 2x/week  2 weeks   Pertinent Vitals/Pain n/a   SLP Goals  SLP Goals Potential to Achieve Goals: Good  SLP Evaluation Prior Functioning  Cognitive/Linguistic Baseline: Information not available  Lives With: Significant other Available Help at Discharge: Friend(s) Vocation: On disability   Cognition  Overall Cognitive Status: No family/caregiver present to determine baseline cognitive functioning Arousal/Alertness: Awake/alert Orientation Level: Oriented to person;Oriented to place;Oriented to situation;Disoriented to time Attention: Selective Selective Attention: Appears intact Memory: Impaired Memory Impairment: Retrieval deficit;Decreased recall of new information;Decreased short term memory Decreased Short Term Memory: Verbal basic Awareness: Impaired Awareness Impairment: Anticipatory impairment Problem Solving: Appears intact Executive Function: Sequencing;Decision Making;Self Correcting Sequencing: Appears intact Decision Making: Appears intact Self Correcting:  Impaired Self Correcting Impairment: Verbal basic;Verbal complex Behaviors: Perseveration;Verbal agitation Safety/Judgment: Impaired    Comprehension  Auditory Comprehension Overall Auditory Comprehension: Appears within functional limits for tasks assessed Yes/No Questions: Within Functional Limits Commands: Within Functional Limits Conversation: Complex Reading Comprehension Reading Status: Within funtional limits    Expression Expression Primary Mode of Expression: Verbal Verbal Expression Overall Verbal Expression: Appears within functional limits for tasks assessed Initiation: No impairment Level of Generative/Spontaneous Verbalization: Conversation Repetition: No impairment Naming: Impairment Responsive: 76-100% accurate Confrontation: Within functional limits Convergent: 75-100% accurate Divergent: 50-74% accurate (naming category members- perseverative) Non-Verbal Means of Communication: Not applicable   Oral / Motor Oral Motor/Sensory Function Overall Oral Motor/Sensory Function: Appears within functional limits for tasks assessed Motor Speech Overall Motor Speech: Appears within functional limits for tasks assessed   GO     Kern Reap, MA, CCC-SLP 04/03/2014, 12:03 PM

## 2014-04-03 NOTE — Consult Note (Signed)
[facility] Consult   Reason for Consult:  AMS and confusion Referring Physician:  Trish Mage ELIANYS Whitaker is an 48 y.o. female. Total Time spent with patient: 45 minutes  Assessment: AXIS I:  Mood Disorder NOS AXIS II:  Deferred AXIS III:   Past Medical History  Diagnosis Date  . Anxiety   . Depression   . COPD (chronic obstructive pulmonary disease)   . MS (multiple sclerosis)   . Fibromyalgia   . Impaired fasting glucose   . 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 Eating disorder   . Chronic low back pain   . PONV (postoperative nausea and vomiting)   . Chronic bronchitis     "yearly" (02/11/2013)  . Borderline diabetes   . GERD (gastroesophageal reflux disease)   . Migraine     "I use to" (02/11/2013)  . Arthritis     "hands & feet" (02/11/2013)  . Peripheral neuropathy     [name] 02/11/2013  . B12 deficiency anemia     [name] 02/11/2013  . Chronic pain syndrome     [name] 02/11/2013  . UTI (lower urinary tract infection) 02/11/2013    [name] 02/11/2013  . Chronic edema     BLE/notes 02/11/2013   AXIS IV:  other psychosocial or environmental problems, problems related to social environment and problems with primary support group AXIS V:  31-40 impairment in reality testing  Plan: Page and Dr. Wyline Copas No evidence of imminent risk to self or others at present.   Patient does not meet criteria for psychiatric inpatient admission. Supportive therapy provided about ongoing stressors. Appreciate psychiatric consultation and followup as clinically required Please contact [phone] if needs further assistance  Subjective:   [name] is a 48 y.o. female patient admitted with confusion.  HPI:   The patient is seen and chart reviewed. Patient has appeared sitting in a chair next to her, safety sitter is in the room. Patient has been calm and cooperative. Patient is awake, and in no oriented to time place person and situation. Patient  denied current symptoms of depression, anxiety and psychosis. Patient denies suicidal homicidal ideation, intention or plans will. Patient has no evidence of confusion at this time given that she presented with confusion on arrival.   Medical history: [name] is a 48 y.o. female with a history of COPD, Chronic Pain and Neuropathy, and Questionable MS who was brought to the ED emergently after being found by her husband around 9 pm unresponsive on the floor. She was found to be hypoxic by EMS with O2 saturations of 40 % and was placed on supplemental O2 with improvement. In the ED when she arrived she was given Narcan IV X1 dose and became alert. On Interview, she adamantly denied any suicidal ideation or intent. 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 There was no intracranial hemorrhage seen on the CT scan. She was referred for medical admission. Per her medical records, MultipleSclerosis is listed as on e of her diagnosis, however, she and her husband are not clear on her actual diagnosis. Her husband reports that what happened tonight happens about once a year  HPI Elements:   Location:  Altered mental status. Quality:  Moderate. Severity:  Acute . Timing:  Medication mismanagement.  Past Psychiatric History: Past Medical History  Diagnosis Date  . Anxiety   . Depression   .  COPD (chronic obstructive pulmonary disease)   . MS (multiple sclerosis)   . Fibromyalgia   . Impaired fasting glucose   . 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 Eating disorder   . Chronic low back pain   . PONV (postoperative nausea and vomiting)   . Chronic bronchitis     "yearly" (02/11/2013)  . Borderline diabetes   . GERD (gastroesophageal reflux disease)   . Migraine     "I use to" (02/11/2013)  . Arthritis     "hands & feet" (02/11/2013)  . Peripheral neuropathy     [name] 02/11/2013  .  B12 deficiency anemia     [name] 02/11/2013  . Chronic pain syndrome     [name] 02/11/2013  . UTI (lower urinary tract infection) 02/11/2013    [name] 02/11/2013  . Chronic edema     BLE/notes 02/11/2013    reports that she has been smoking Cigarettes.  She has a 33 pack-year smoking history. She has quit using smokeless tobacco. She reports that she does not drink alcohol or use illicit drugs. Family History  Problem Relation Age of Onset  . Anesthesia problems Neg Hx   . Hypotension Neg Hx   . Malignant hyperthermia Neg Hx   . Pseudochol deficiency Neg Hx   . Hyperlipidemia Mother   . Heart disease Mother   . Cancer Mother     lung  . Diabetes Father   . Heart disease Father   . Cancer Maternal Grandmother     breast     Living Arrangements: Spouse/significant other   Abuse/Neglect [facility]) Physical Abuse: Denies Verbal Abuse: Denies Sexual Abuse: Denies Allergies:   Allergies  Allergen Reactions  . Ambien [Zolpidem Tartrate] Other (See Comments)    Crazy dreams  . Ceftin [Cefuroxime Axetil] Nausea And Vomiting  . Hctz [Hydrochlorothiazide] Other (See Comments)    Urinary retention  . Hydrocodone Nausea Only  . Lodine [Etodolac] Hives  . Promethazine Other (See Comments)    Hallucinations.   . Roxicodone [Oxycodone Hcl] Swelling  . Codeine Rash  . Latex Rash  . Penicillins Rash    ACT Assessment Complete:  No  Objective: Blood pressure 143/97, pulse 108, temperature 98.2 F (36.8 C), temperature source Oral, resp. rate 20, height 4' 9"  (1.448 m), weight 71.668 kg (158 lb), SpO2 96.00%.Body mass index is 34.18 kg/(m^2). Results for orders placed during the hospital encounter of 04/01/14 (from the past 72 hour(s))  CBC WITH DIFFERENTIAL     Status: Abnormal   Collection Time    04/01/14 10:15 PM      Result Value Ref Range   WBC 17.9 (*) 4.0 - 10.5 K/uL   RBC 5.04  3.87 - 5.11 MIL/uL   Hemoglobin 17.3 (*) 12.0 - 15.0 g/dL   HCT 51.7 (*) 36.0 - 46.0 %   MCV 102.6 (*)  78.0 - 100.0 fL   MCH 34.3 (*) 26.0 - 34.0 pg   MCHC 33.5  30.0 - 36.0 g/dL   RDW 12.7  11.5 - 15.5 %   Platelets 212  150 - 400 K/uL   Neutrophils Relative % 82 (*) 43 - 77 %   Neutro Abs 14.7 (*) 1.7 - 7.7 K/uL   Lymphocytes Relative 13  12 - 46 %   Lymphs Abs 2.2  0.7 - 4.0 K/uL   Monocytes Relative 5  3 - 12 %   Monocytes Absolute 0.9  0.1 - 1.0 K/uL   Eosinophils Relative 0  0 - 5 %   Eosinophils Absolute 0.1  0.0 - 0.7 K/uL   Basophils Relative 0  0 - 1 %   Basophils Absolute 0.0  0.0 - 0.1 K/uL  COMPREHENSIVE METABOLIC PANEL     Status: Abnormal   Collection Time    04/01/14 10:15 PM      Result Value Ref Range   Sodium 136 (*) 137 - 147 mEq/L   Potassium 5.2  3.7 - 5.3 mEq/L   Chloride 94 (*) 96 - 112 mEq/L   CO2 30  19 - 32 mEq/L   Glucose, Bld 189 (*) 70 - 99 mg/dL   BUN 5 (*) 6 - 23 mg/dL   Creatinine, Ser 0.80  0.50 - 1.10 mg/dL   Calcium 8.8  8.4 - 10.5 mg/dL   Total Protein 7.5  6.0 - 8.3 g/dL   Albumin 3.3 (*) 3.5 - 5.2 g/dL   AST HEMOLYZED SPECIMEN, RESULTS MAY BE AFFECTED  0 - 37 U/L   ALT 11  0 - 35 U/L   Alkaline Phosphatase 140 (*) 39 - 117 U/L   Total Bilirubin <0.2 (*) 0.3 - 1.2 mg/dL   GFR calc non Af Amer 86 (*) >90 mL/min   GFR calc Af Amer >90  >90 mL/min   Comment: (NOTE)     The eGFR has been calculated using the CKD EPI equation.     This calculation has not been validated in all clinical situations.     eGFR's persistently <90 mL/min signify possible Chronic Kidney     Disease.  TROPONIN I     Status: None   Collection Time    04/01/14 10:15 PM      Result Value Ref Range   Troponin I <0.30  <0.30 ng/mL   Comment:            Due to the release kinetics of cTnI,     a negative result within the first hours     of the onset of symptoms does not rule out     myocardial infarction with certainty.     If myocardial infarction is still suspected,     repeat the test at appropriate intervals.  ACETAMINOPHEN LEVEL     Status: None    Collection Time    04/01/14 10:25 PM      Result Value Ref Range   Acetaminophen (Tylenol), Serum <15.0  10 - 30 ug/mL   Comment:            THERAPEUTIC CONCENTRATIONS VARY     SIGNIFICANTLY. A RANGE OF 10-30     ug/mL MAY BE AN EFFECTIVE     CONCENTRATION FOR MANY PATIENTS.     HOWEVER, SOME ARE BEST TREATED     AT CONCENTRATIONS OUTSIDE THIS     RANGE.     ACETAMINOPHEN CONCENTRATIONS     >150 ug/mL AT 4 HOURS AFTER     INGESTION AND >50 ug/mL AT 12     HOURS AFTER INGESTION ARE     OFTEN ASSOCIATED WITH TOXIC     REACTIONS.  SALICYLATE LEVEL     Status: Abnormal   Collection Time    04/01/14 10:25 PM      Result Value Ref Range   Salicylate Lvl <3.6 (*) 2.8 - 20.0 mg/dL  ETHANOL     Status: None   Collection Time    04/01/14 10:25 PM      Result Value Ref Range   Alcohol, Ethyl (B) <  11  0 - 11 mg/dL   Comment:            LOWEST DETECTABLE LIMIT FOR     SERUM ALCOHOL IS 11 mg/dL     FOR MEDICAL PURPOSES ONLY  URINALYSIS, ROUTINE W REFLEX MICROSCOPIC     Status: Abnormal   Collection Time    04/01/14 10:39 PM      Result Value Ref Range   Color, Urine YELLOW  YELLOW   APPearance CLEAR  CLEAR   Specific Gravity, Urine >1.030 (*) 1.005 - 1.030   pH 5.5  5.0 - 8.0   Glucose, UA 100 (*) NEGATIVE mg/dL   Hgb urine dipstick NEGATIVE  NEGATIVE   Bilirubin Urine NEGATIVE  NEGATIVE   Ketones, ur NEGATIVE  NEGATIVE mg/dL   Protein, ur NEGATIVE  NEGATIVE mg/dL   Urobilinogen, UA 0.2  0.0 - 1.0 mg/dL   Nitrite POSITIVE (*) NEGATIVE   Leukocytes, UA TRACE (*) NEGATIVE  URINE RAPID DRUG SCREEN (HOSP PERFORMED)     Status: Abnormal   Collection Time    04/01/14 10:39 PM      Result Value Ref Range   Opiates NONE DETECTED  NONE DETECTED   Cocaine NONE DETECTED  NONE DETECTED   Benzodiazepines POSITIVE (*) NONE DETECTED   Amphetamines NONE DETECTED  NONE DETECTED   Tetrahydrocannabinol NONE DETECTED  NONE DETECTED   Barbiturates NONE DETECTED  NONE DETECTED   Comment:             DRUG SCREEN FOR MEDICAL PURPOSES     ONLY.  IF CONFIRMATION IS NEEDED     FOR ANY PURPOSE, NOTIFY LAB     WITHIN 5 DAYS.                LOWEST DETECTABLE LIMITS     FOR URINE DRUG SCREEN     Drug Class       Cutoff (ng/mL)     Amphetamine      1000     Barbiturate      200     Benzodiazepine   503     Tricyclics       546     Opiates          300     Cocaine          300     THC              50  URINE MICROSCOPIC-ADD ON     Status: Abnormal   Collection Time    04/01/14 10:39 PM      Result Value Ref Range   Squamous Epithelial / LPF MANY (*) RARE   WBC, UA TOO NUMEROUS TO COUNT  <3 WBC/hpf   Bacteria, UA MANY (*) RARE   Casts HYALINE CASTS (*) NEGATIVE  CBG MONITORING, ED     Status: Abnormal   Collection Time    04/01/14 10:42 PM      Result Value Ref Range   Glucose-Capillary 165 (*) 70 - 99 mg/dL  POC URINE PREG, ED     Status: None   Collection Time    04/01/14 10:51 PM      Result Value Ref Range   Preg Test, Ur NEGATIVE  NEGATIVE   Comment:            THE SENSITIVITY OF THIS     METHODOLOGY IS >24 mIU/mL  URINE CULTURE     Status: None   Collection Time  04/01/14 11:22 PM      Result Value Ref Range   Specimen Description URINE, CLEAN CATCH     Special Requests NONE     Culture  Setup Time       Value: 04/02/2014 14:42     Performed at [facility] Count       Value: >=100,000 COLONIES/ML     Performed at [facility]   Culture       Value: [place]     Performed at [facility]   Report Status PENDING    CBG MONITORING, ED     Status: Abnormal   Collection Time    04/02/14  2:39 AM      Result Value Ref Range   Glucose-Capillary 125 (*) 70 - 99 mg/dL   Comment 1 Documented in Chart     Comment 2 Notify RN    HEMOGLOBIN A1C     Status: Abnormal   Collection Time    04/02/14  5:25 AM      Result Value Ref Range   Hemoglobin A1C 5.7 (*) <5.7 %   Comment: (NOTE)                                                                                According to the ADA Clinical Practice Recommendations for 2011, when     HbA1c is used as a screening test:      >=6.5%   Diagnostic of Diabetes Mellitus               (if abnormal result is confirmed)     5.7-6.4%   Increased risk of developing Diabetes Mellitus     References:Diagnosis and Classification of Diabetes Mellitus,Diabetes     DJME,2683,41(DQQIW 1):S62-S69 and Standards of Medical Care in             Diabetes - 2011,Diabetes Care,2011,34 (Suppl 1):S11-S61.   Mean Plasma Glucose 117 (*) <117 mg/dL   Comment: Performed at [facility]: Abnormal   Collection Time    04/02/14  5:25 AM      Result Value Ref Range   Cholesterol 194  0 - 200 mg/dL   Triglycerides 141  <150 mg/dL   HDL 48  >39 mg/dL   Total CHOL/HDL Ratio 4.0     VLDL 28  0 - 40 mg/dL   LDL Cholesterol 118 (*) 0 - 99 mg/dL   Comment:            Total Cholesterol/HDL:CHD Risk     Coronary Heart Disease Risk Table                         Men   Women      1/2 Average Risk   3.4   3.3      Average Risk       5.0   4.4      2 X Average Risk   9.6   7.1      3 X Average Risk  23.4   11.0  Use the calculated Patient Ratio     above and the CHD Risk Table     to determine the patient's CHD Risk.                ATP III CLASSIFICATION (LDL):      <100     mg/dL   Optimal      100-129  mg/dL   Near or Above                        Optimal      130-159  mg/dL   Borderline      160-189  mg/dL   High      >190     mg/dL   Very High  MRSA PCR SCREENING     Status: None   Collection Time    04/02/14  6:30 AM      Result Value Ref Range   MRSA by PCR NEGATIVE  NEGATIVE   Comment:            The GeneXpert MRSA Assay (FDA     approved for NASAL specimens     only), is one component of a     comprehensive MRSA colonization     surveillance program. It is not     intended to diagnose MRSA     infection nor to guide or      monitor treatment for     MRSA infections.  GLUCOSE, CAPILLARY     Status: Abnormal   Collection Time    04/02/14  8:10 AM      Result Value Ref Range   Glucose-Capillary 134 (*) 70 - 99 mg/dL   Comment 1 Notify RN     Comment 2 Documented in Chart    GLUCOSE, CAPILLARY     Status: Abnormal   Collection Time    04/02/14 11:13 AM      Result Value Ref Range   Glucose-Capillary 159 (*) 70 - 99 mg/dL  URINE RAPID DRUG SCREEN (HOSP PERFORMED)     Status: None   Collection Time    04/02/14 12:32 PM      Result Value Ref Range   Opiates NONE DETECTED  NONE DETECTED   Cocaine NONE DETECTED  NONE DETECTED   Benzodiazepines NONE DETECTED  NONE DETECTED   Amphetamines NONE DETECTED  NONE DETECTED   Tetrahydrocannabinol NONE DETECTED  NONE DETECTED   Barbiturates NONE DETECTED  NONE DETECTED   Comment:            DRUG SCREEN FOR MEDICAL PURPOSES     ONLY.  IF CONFIRMATION IS NEEDED     FOR ANY PURPOSE, NOTIFY LAB     WITHIN 5 DAYS.                LOWEST DETECTABLE LIMITS     FOR URINE DRUG SCREEN     Drug Class       Cutoff (ng/mL)     Amphetamine      1000     Barbiturate      200     Benzodiazepine   294     Tricyclics       765     Opiates          300     Cocaine          300     THC  50  GLUCOSE, CAPILLARY     Status: Abnormal   Collection Time    04/02/14  3:59 PM      Result Value Ref Range   Glucose-Capillary 162 (*) 70 - 99 mg/dL   Comment 1 Notify RN     Comment 2 Documented in Chart    GLUCOSE, CAPILLARY     Status: Abnormal   Collection Time    04/02/14  9:07 PM      Result Value Ref Range   Glucose-Capillary 154 (*) 70 - 99 mg/dL  GLUCOSE, CAPILLARY     Status: Abnormal   Collection Time    04/02/14  9:35 PM      Result Value Ref Range   Glucose-Capillary 165 (*) 70 - 99 mg/dL   Comment 1 Notify RN     Comment 2 Documented in Chart    GLUCOSE, CAPILLARY     Status: Abnormal   Collection Time    04/03/14 12:40 AM      Result Value Ref Range    Glucose-Capillary 116 (*) 70 - 99 mg/dL   Comment 1 Notify RN     Comment 2 Documented in Chart    GLUCOSE, CAPILLARY     Status: Abnormal   Collection Time    04/03/14  4:58 AM      Result Value Ref Range   Glucose-Capillary 160 (*) 70 - 99 mg/dL   Comment 1 Notify RN     Comment 2 Documented in Chart    GLUCOSE, CAPILLARY     Status: Abnormal   Collection Time    04/03/14  8:13 AM      Result Value Ref Range   Glucose-Capillary 169 (*) 70 - 99 mg/dL   Comment 1 Documented in Chart     Comment 2 Notify RN    GLUCOSE, CAPILLARY     Status: Abnormal   Collection Time    04/03/14 11:48 AM      Result Value Ref Range   Glucose-Capillary 170 (*) 70 - 99 mg/dL   Comment 1 Documented in Chart     Comment 2 Notify RN     Labs are reviewed and are pertinent for .  Current Facility-Administered Medications  Medication Dose Route Frequency Provider Last Rate Last Dose  . 0.9 %  sodium chloride infusion   Intravenous Continuous Reyne Dumas, MD 75 mL/hr at 04/03/14 1027    . albuterol (PROVENTIL) (2.5 MG/3ML) 0.083% nebulizer solution 2.5 mg  2.5 mg Nebulization Q6H PRN Theressa Millard, MD      . aspirin suppository 300 mg  300 mg Rectal Daily Theressa Millard, MD       Or  . aspirin tablet 325 mg  325 mg Oral Daily Theressa Millard, MD   325 mg at 04/03/14 1017  . ciprofloxacin (CIPRO) IVPB 400 mg  400 mg Intravenous Q12H Theressa Millard, MD   400 mg at 04/03/14 1017  . clonazePAM (KLONOPIN) tablet 0.5 mg  0.5 mg Oral BID Reyne Dumas, MD   0.5 mg at 04/03/14 1017  . enoxaparin (LOVENOX) injection 40 mg  40 mg Subcutaneous Q24H Reyne Dumas, MD   40 mg at 04/03/14 1018  . folic acid (FOLVITE) tablet 1 mg  1 mg Oral Daily Reyne Dumas, MD   1 mg at 04/03/14 1017  . insulin aspart (novoLOG) injection 0-9 Units  0-9 Units Subcutaneous 6 times per day Theressa Millard, MD      . LORazepam (  ATIVAN) injection 0.5-1 mg  0.5-1 mg Intravenous Q6H PRN Theressa Millard, MD   1 mg  at 04/03/14 0150  . LORazepam (ATIVAN) injection 2 mg  2 mg Intravenous Once Reyne Dumas, MD      . nicotine (NICODERM CQ - dosed in mg/24 hours) patch 14 mg  14 mg Transdermal Daily Reyne Dumas, MD   14 mg at 04/03/14 1017  . oxyCODONE-acetaminophen (PERCOCET/ROXICET) 5-325 MG per tablet 2 tablet  2 tablet Oral Q8H PRN Reyne Dumas, MD   2 tablet at 04/03/14 1033  . senna-docusate (Senokot-S) tablet 1 tablet  1 tablet Oral QHS PRN Theressa Millard, MD        Psychiatric Specialty Exam: Physical Exam  ROS  Blood pressure 143/97, pulse 108, temperature 98.2 F (36.8 C), temperature source Oral, resp. rate 20, height 4' 9"  (1.448 m), weight 71.668 kg (158 lb), SpO2 96.00%.Body mass index is 34.18 kg/(m^2).  General Appearance: Casual  Eye Contact::  Good  Speech:  Clear and Coherent  Volume:  Normal  Mood:  Euthymic  Affect:  Appropriate and Congruent  Thought Process:  Coherent and Goal Directed  Orientation:  Full (Time, Place, and Person)  Thought Content:  WDL  Suicidal Thoughts:  No  Homicidal Thoughts:  No  Memory:  Immediate;   Good Recent;   Good  Judgement:  Good  Insight:  Good  Psychomotor Activity:  Decreased  Concentration:  Good  Recall:  Good  Fund of Knowledge:Good  Language: Good  Akathisia:  NA  Handed:  Right  AIMS (if indicated):     Assets:  Communication Skills Desire for Improvement Financial Resources/Insurance Housing Intimacy Leisure Time Resilience Social Support Talents/Skills Transportation  Sleep:      Musculoskeletal: Strength & Muscle Tone: within normal limits Gait & Station: normal Patient leans: N/A  Treatment Plan Summary: Daily contact with patient to assess and evaluate symptoms and progress in treatment Medication management  JONNALAGADDA,JANARDHAHA R. 04/03/2014 12:08 PM

## 2014-04-03 NOTE — Progress Notes (Signed)
Subjective:   Objective: Current vital signs: BP 143/97  Pulse 108  Temp(Src) 98.2 F (36.8 C) (Oral)  Resp 20  Ht 4\' 9"  (1.448 m)  Wt 71.668 kg (158 lb)  BMI 34.18 kg/m2  SpO2 96% Vital signs in last 24 hours: Temp:  [97.9 F (36.6 C)-99.3 F (37.4 C)] 98.2 F (36.8 C) (06/26 0959) Pulse Rate:  [100-108] 108 (06/26 0959) Resp:  [16-20] 20 (06/26 0959) BP: (119-158)/(65-99) 143/97 mmHg (06/26 0959) SpO2:  [95 %-100 %] 96 % (06/26 0959)  Intake/Output from previous day: 06/25 0701 - 06/26 0700 In: 840 [P.O.:840] Out: 1175 [Urine:1175] Intake/Output this shift:   Nutritional status: NPO  Neurologic Exam: Mental Status: Alert, oriented.  Appears on edge. Speech fluent without evidence of aphasia.  Able to follow 3 step commands without difficulty. Cranial Nerves: II:  Visual fields grossly normal, pupils equal 69mm bilaterally, round, reactive to light and accommodation III,IV, VI: ptosis not present, extra-ocular motions intact bilaterally V,VII: smile symmetric, facial light touch sensation normal bilaterally VIII: hearing normal bilaterally IX,X: gag reflex present XI: bilateral shoulder shrug XII: midline tongue extension without atrophy or fasciculations  Motor: Right : Upper extremity   5/5    Left:     Upper extremity   5/5  Lower extremity   5/5     Lower extremity   5/5 --postural and intention tremor Tone and bulk:normal tone throughout; no atrophy noted Sensory: Pinprick and light touch intact throughout, bilaterally Deep Tendon Reflexes:  Right: Upper Extremity   Left: Upper extremity   biceps (C-5 to C-6) 2/4   biceps (C-5 to C-6) 2/4 tricep (C7) 2/4    triceps (C7) 2/4 Brachioradialis (C6) 2/4  Brachioradialis (C6) 2/4  Lower Extremity Lower Extremity  quadriceps (L-2 to L-4) 2/4   quadriceps (L-2 to L-4) 2/4 Achilles (S1) 2/4   Achilles (S1) 2/4  Plantars: Right: downgoing   Left: downgoing Cerebellar: normal finger-to-nose,  normal  heel-to-shin test--no negative myoclonus noted today    Lab Results: Basic Metabolic Panel:  Recent Labs Lab 04/01/14 2215  NA 136*  K 5.2  CL 94*  CO2 30  GLUCOSE 189*  BUN 5*  CREATININE 0.80  CALCIUM 8.8    Liver Function Tests:  Recent Labs Lab 04/01/14 2215  AST HEMOLYZED SPECIMEN, RESULTS MAY BE AFFECTED  ALT 11  ALKPHOS 140*  BILITOT <0.2*  PROT 7.5  ALBUMIN 3.3*   No results found for this basename: LIPASE, AMYLASE,  in the last 168 hours No results found for this basename: AMMONIA,  in the last 168 hours  CBC:  Recent Labs Lab 04/01/14 2215  WBC 17.9*  NEUTROABS 14.7*  HGB 17.3*  HCT 51.7*  MCV 102.6*  PLT 212    Cardiac Enzymes:  Recent Labs Lab 04/01/14 2215  TROPONINI <0.30    Lipid Panel:  Recent Labs Lab 04/02/14 0525  CHOL 194  TRIG 141  HDL 48  CHOLHDL 4.0  VLDL 28  LDLCALC 118*    CBG:  Recent Labs Lab 04/02/14 2107 04/02/14 2135 04/03/14 0040 04/03/14 0458 04/03/14 0813  GLUCAP 154* 165* 116* 160* 169*    Microbiology: Results for orders placed during the hospital encounter of 04/01/14  URINE CULTURE     Status: None   Collection Time    04/01/14 11:22 PM      Result Value Ref Range Status   Specimen Description URINE, CLEAN CATCH   Final   Special Requests NONE   Final  Culture  Setup Time     Final   Value: 04/02/2014 14:42     Performed at [facility]     Final   Value: >=100,000 COLONIES/ML     Performed at [facility]   Culture     Final   Value: [place]     Performed at [facility]   Report Status PENDING   Incomplete  MRSA PCR SCREENING     Status: None   Collection Time    04/02/14  6:30 AM      Result Value Ref Range Status   MRSA by PCR NEGATIVE  NEGATIVE Final   Comment:            The GeneXpert MRSA Assay (FDA     approved for NASAL specimens     only), is one component of a     comprehensive MRSA colonization     surveillance  program. It is not     intended to diagnose MRSA     infection nor to guide or     monitor treatment for     MRSA infections.    Coagulation Studies: No results found for this basename: LABPROT, INR,  in the last 72 hours  Imaging: Dg Chest 2 View  04/02/2014   CLINICAL DATA:  Shortness of breath. Altered mental status. COPD. Multiple sclerosis.  EXAM: CHEST  2 VIEW  COMPARISON:  07/23/2011  FINDINGS: Lower cervical plate and screw fixator. Cardiac and mediastinal margins appear normal. Bilateral lower lobe subsegmental atelectasis. Lungs appear otherwise clear. No pleural effusion identified.  IMPRESSION: 1. Bilateral subsegmental atelectasis in both lung bases. Otherwise negative exam.   Electronically Signed   By: Sherryl Barters M.D.   On: 04/02/2014 00:42   Ct Head Wo Contrast  04/02/2014   CLINICAL DATA:  Altered mental status. Patient found unresponsive at home. History of multiple sclerosis.  EXAM: CT HEAD WITHOUT CONTRAST  TECHNIQUE: Contiguous axial images were obtained from the base of the skull through the vertex without intravenous contrast.  COMPARISON:  07/24/2011  FINDINGS: Despite efforts by the technologist and patient, motion artifact is present on today's exam and could not be eliminated. This reduces exam sensitivity and specificity. New sharply defined hypodensities along the globus pallidus nuclei bilaterally favoring symmetric chronic lacunar infarcts. These are not visible in 2012.  Otherwise, the brainstem, cerebellum, cerebral peduncles, thalamus, basal ganglia, basilar cisterns, and ventricular system appear within normal limits. No intracranial hemorrhage, mass lesion, or acute CVA.  IMPRESSION: 1. Small symmetric infarcts in both globus pallidus nuclei appear to be a new compared to the prior examination. This may be incidental although there are some associations for bilateral globus pallidus infarcts including poisoning by cyanide, methamphetamine, cocaine, and  opiates.   Electronically Signed   By: Sherryl Barters M.D.   On: 04/02/2014 00:50    Medications:  Scheduled: . aspirin  300 mg Rectal Daily   Or  . aspirin  325 mg Oral Daily  . ciprofloxacin  400 mg Intravenous Q12H  . clonazePAM  0.5 mg Oral BID  . enoxaparin (LOVENOX) injection  40 mg Subcutaneous Q24H  . folic acid  1 mg Oral Daily  . insulin aspart  0-9 Units Subcutaneous 6 times per day  . 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 This pattern of injury  as well described in drug overdose with narcotics, cocaine. It is also present with carbon monoxide poisoning. Cyanide has also been described as causing this injury, but she has no other clinical signs and no metabolic acidosis to suggest cyanide poisoning. The response to Narcan described would strongly suggest narcotics as an etiology. Carboxyhemoglobin canceled by primary team.   Awaiting MRI brain. Patient appears to be slightly improving as far as her asterixis. Will continue to follow    Etta Quill PA-C [facility] [phone]  04/03/2014, 10:49 AM

## 2014-04-03 NOTE — Progress Notes (Signed)
Occupational Therapy Evaluation Patient Details Name: [name] MRN: [id number] DOB: [birth year] Today's Date: 04/03/2014    History of Present Illness [name] is a 48 y.o. Female admitted 04/01/14 after her significant other found her sitting in the recliner unresponsive. She was found to be hypoxic by EMS with O2 saturations of 40 % and was placed on supplemental O2 with improvement.  In the ED when she arrived she was given Narcan IV X1 dose and became alert. On Interview, she adamantly denied any suicidal ideation or intent.  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 Pt's significant other reports that pt has incidents like this once a year.    Clinical Impression   PTA pt was independent with ADLs and functional mobility and was driving according to pt's significant other. Pt's S.O. reports that this occurs to pt about "once a year" and reports that the last time this happened, pt was at [facility] for 3 months before d/c to a SNF. He then signed her out of the SNF due to lack of progress and reports that pt then went to rehab at [facility]. Pt presents with decreased attention, decreased safety awareness, and impulsivity, however pt's S.O. reports that pt is at baseline for cognition. Pt would be a good candidate for CIR to increase independence prior to return home. Pt would benefit from continued skilled OT to address ADLs.     Follow Up Recommendations  CIR;Supervision/Assistance - 24 hour    Equipment Recommendations  Other (comment) (Defer to CIR)       Precautions / Restrictions Precautions Precautions: Fall Precaution Comments: ataxic like movements; pt reports feeling "antsy/jumpy in my skin" Restrictions Weight Bearing Restrictions: No      Mobility Bed Mobility Overal bed mobility: Needs Assistance             General bed mobility comments: Pt reached for bed as she  neared and climbed on top by putting R knee up and then "bear climbing" onto bed. Encouraged pt to safely sit on EOB before lying down during mobility.   Transfers Overall transfer level: Needs assistance Equipment used: 2 person hand held assist Transfers: Sit to/from Stand Sit to Stand: Mod assist;+2 safety/equipment         General transfer comment: pt ataxic like movements; mod (A) to maintain balance and handheld (A) provided; pt swaying throughout and unsteady; cues for safety         ADL Overall ADL's : Needs assistance/impaired Eating/Feeding: Set up;Supervision/ safety;Sitting   Grooming: Sitting;Min guard   Upper Body Bathing: Sitting;Min guard   Lower Body Bathing: +2 for safety/equipment;Moderate assistance;Sit to/from stand   Upper Body Dressing : Min guard;Sitting   Lower Body Dressing: Moderate assistance;+2 for safety/equipment;Sit to/from stand Lower Body Dressing Details (indicate cue type and reason): Pt able to reach feet to fix socks, however requires assist for sit<>stand to complete LB ADLS Toilet Transfer: Moderate assistance;+2 for safety/equipment;Cueing for safety;Ambulation;Regular Toilet (2 person Hand held assist)   Toileting- Clothing Manipulation and Hygiene: Moderate assistance;+2 for safety/equipment;Sit to/from stand   Tub/ Banker: Tub transfer;Maximal assistance   Functional mobility during ADLs: Moderate assistance;+2 for safety/equipment (2 Person Hand Held assist due to ataxic movements) General ADL Comments: Pt required multiple VC's to stay sitting on EOB for OT to assess vision, however pt repeatedly kept lying down and difficult to complete vision assessment. Pt  with some dysarthric speech and answered questions vaguely. At one point, pt spoke about wanting to not miss "my baby being born".and when asked to clarify she explained "I missed my other one last time I was sick. He was born in the hospital."  Significant other  explained that pt was talking about her grandchildren. Pt ambulated to bathroom for toileting with +2 physical assist (mod A) due to ataxic movement. Pt appears to have decreased safety awareness as she reports that she wanted to go home yesterday. Educated pt and significant other on CIR recommendation from PT/OT and CIR referral process. Encouraged pt and significant other to discuss CIR for d/c.       Vision Eye Alignment: Within Functional Limits Alignment/Gaze Preference: Within Defined Limits Ocular Range of Motion: Within Functional Limits Tracking/Visual Pursuits: Decreased smoothness of vertical tracking Saccades: Other (comment) (pt unable to perform without "fluttering" eye lids open and ) Convergence:  (To be tested)     Additional Comments: Attempted vision screen, however pt with limited attention and repeatedly lying down on her bed. Will continue to assess during next OT session.    Perception Perception Perception Tested?: No   Praxis Praxis Praxis tested?: Within functional limits    Pertinent Vitals/Pain NAD     Hand Dominance Right   Extremity/Trunk Assessment Upper Extremity Assessment Upper Extremity Assessment: Generalized weakness (ataxic movements)   Lower Extremity Assessment Lower Extremity Assessment: Defer to PT evaluation   Cervical / Trunk Assessment Cervical / Trunk Assessment: Kyphotic   Communication Communication Communication: Expressive difficulties;Other (comment) (dysarthric at times)   Cognition Arousal/Alertness: Awake/alert Behavior During Therapy: Flat affect Overall Cognitive Status: Impaired/Different from baseline Area of Impairment: Orientation;Attention;Safety/judgement;Awareness;Problem solving Orientation Level: Time;Disoriented to Current Attention Level: Focused Memory: Decreased short-term memory Following Commands: Follows multi-step commands with increased time (pt is able however, attention span makes multi-step  commands) Safety/Judgement: Decreased awareness of safety;Decreased awareness of deficits Awareness: Emergent Problem Solving: Difficulty sequencing;Slow processing;Decreased initiation;Requires verbal cues;Requires tactile cues General Comments: S.O. reports that pt cognitive status appears to be at baseline. Feel that pt is likely somewhat impulsive at baseline.               Home Living Family/patient expects to be discharged to:: Private residence Living Arrangements: Spouse/significant other Available Help at Discharge: Friend(s) Type of Home: Mobile home Home Access: Stairs to enter   Entrance Stairs-Rails: Right Home Layout: One level     Bathroom Shower/Tub: Teacher, early years/pre: Standard Bathroom Accessibility: Yes How Accessible: Accessible via walker     Additional Comments: S.O. has corroborated pt's report of multiple home devices and pt's PLOF. Pt is vague with her answers but appears to give correct information, however it is important to clarify precisely with her.  Lives With: Significant other    Prior Functioning/Environment Level of Independence: Independent             OT Diagnosis: Generalized weakness;Cognitive deficits;Disturbance of vision;Ataxia   OT Problem List: Impaired balance (sitting and/or standing);Impaired vision/perception;Decreased coordination;Decreased cognition;Decreased safety awareness;Decreased knowledge of precautions;Decreased knowledge of use of DME or AE;Impaired UE functional use;Impaired sensation   OT Treatment/Interventions: Self-care/ADL training;Neuromuscular education;Energy conservation;DME and/or AE instruction;Therapeutic activities;Cognitive remediation/compensation;Visual/perceptual remediation/compensation;Patient/family education;Balance training    OT Goals(Current goals can be found in the care plan section) Acute Rehab OT Goals Patient Stated Goal: to go home OT Goal Formulation: With  patient/family Time For Goal Achievement: 04/10/14 Potential to Achieve Goals: Good ADL Goals Pt Will Perform Grooming: with  supervision;standing Pt Will Perform Lower Body Bathing: with supervision;sit to/from stand Pt Will Perform Lower Body Dressing: with supervision;sit to/from stand Pt Will Transfer to Toilet: with supervision;ambulating;regular height toilet Pt Will Perform Toileting - Clothing Manipulation and hygiene: with supervision;sit to/from stand Pt Will Perform Tub/Shower Transfer: Tub transfer;with min guard assist;ambulating Additional ADL Goal #1: Pt will perform bed mobility with Supervision to prepare for ADLs.   OT Frequency: Min 2X/week    End of Session  Activity Tolerance: Patient tolerated treatment well Patient left: in bed;with call bell/phone within reach;with nursing/sitter in room;with family/visitor present   Time: 9191-6606 OT Time Calculation (min): 34 min Charges:  OT General Charges $OT Visit: 1 Procedure OT Evaluation $Initial OT Evaluation Tier I: 1 Procedure OT Treatments $Self Care/Home Management : 23-37 mins  Juluis Rainier [phone] 04/03/2014, 5:05 PM

## 2014-04-03 NOTE — Progress Notes (Signed)
Rehab Admissions Coordinator Note:  Patient was screened by Cleatrice Burke for appropriateness for an Inpatient Acute Rehab Consult per PT recommendation.  At this time, we are recommending await further workup befoe determining rehab venue options. I will follow .  Cleatrice Burke 04/03/2014, 1:21 PM  I can be reached at [phone].

## 2014-04-03 NOTE — Evaluation (Signed)
Physical Therapy Evaluation Patient Details Name: [name] MRN: [id number] DOB: 1966/03/29 Today's Date: 04/03/2014   History of Present Illness   48 year old female with bilateral globus pallidus injury in the setting of likely drug overdose. This pattern of injury as well described in drug overdose with narcotics, cocaine. It is also present with carbon monoxide poisoning. MRI pending.   Clinical Impression  Pt adm due to the above. No family present to determine PLOF and pt is a poor historian. Pt reports she ambulated with RW vs SPC prior to admission. Pt presents with significant decline in functional mobility and severe balance deficits at this time. Pt also demo cognition deficits and has difficulty with word finding. Again, no family present to determine baseline. Pt to benefit from skilled acute PT to address deficits and maximize functional mobility prior to D/C. Will recommend CIR at this time to increase pt independence and reduce caregiver burden prior to return home with significant other.     Follow Up Recommendations CIR    Equipment Recommendations  Other (comment) (TBD)    Recommendations for Other Services Rehab consult;OT consult     Precautions / Restrictions Precautions Precautions: Fall Precaution Comments: ataxic like movements Restrictions Weight Bearing Restrictions: No      Mobility  Bed Mobility Overal bed mobility: Needs Assistance Bed Mobility: Supine to Sit     Supine to sit: Min assist;HOB elevated     General bed mobility comments: (A) to elevate pt to sitting position; cues for hand placement and sequencing; ataxic movement throughout and dysmetria when reaching for handrails   Transfers Overall transfer level: Needs assistance   Transfers: Sit to/from Stand Sit to Stand: Mod assist         General transfer comment: pt ataxic like movements; mod (A) to maintain balance and handheld (A) provided; pt swaying throughout and unsteady;  cues for safety  Ambulation/Gait Ambulation/Gait assistance: +2 safety/equipment;Min assist Ambulation Distance (Feet): 14 Feet Assistive device: 1 person hand held assist;2 person hand held assist Gait Pattern/deviations: Step-through pattern;Ataxic;Decreased stride length;Shuffle;Narrow base of support;Drifts right/left Gait velocity: decreased Gait velocity interpretation: Below normal speed for age/gender General Gait Details: pt with ataxic like movements with stepping; required handheld (A)  with 1 person and 2 person at times to maintain balance; pt demo decr safety awareness with gt and believes she can walk independently; pt with multiple LOB and balance disturbances with short gt distance; may benefit from AD if able to coordinate UEs   Stairs            Wheelchair Mobility    Modified Rankin (Stroke Patients Only) Modified Rankin (Stroke Patients Only) Pre-Morbid Rankin Score:  (difficult to determine due to poor historian ) Modified Rankin: Moderately severe disability     Balance Overall balance assessment: Needs assistance;History of Falls Sitting-balance support: Feet supported;No upper extremity supported Sitting balance-Leahy Scale: Poor Sitting balance - Comments: denies any dizziness    Standing balance support: During functional activity;Bilateral upper extremity supported Standing balance-Leahy Scale: Poor Standing balance comment: unsteady and ataxic; 2 person handheld (A)  to balance                             Pertinent Vitals/Pain C/o pain at bottom of feet; did not rate     Home Living Family/patient expects to be discharged to:: Private residence Living Arrangements: Spouse/significant other;Other (Comment) ("black momma" ) Available Help at Discharge: Friend(s) Type of Home:  Mobile home Home Access: Stairs to enter   Entrance Stairs-Number of Steps:  (unable to recall amount of steps )   Home Equipment: Walker - 2 wheels;Cane -  single point Additional Comments: pt is poor historian and given different information throughout session regarding PLOF and homsetup; from ambulation PTA to having a hoyer lift for transfers     Prior Function           Comments: pt giving contradicting information throughout session; will require (A) at this time     Hand Dominance   Dominant Hand: Right    Extremity/Trunk Assessment   Upper Extremity Assessment: Defer to OT evaluation           Lower Extremity Assessment: Difficult to assess due to impaired cognition;Generalized weakness (decreased coordination in bil LEs; grossly 3/5 )      Cervical / Trunk Assessment: Kyphotic  Communication   Communication: Expressive difficulties;Other (comment) (dysarthric at times )  Cognition Arousal/Alertness: Awake/alert Behavior During Therapy: Impulsive;Anxious Overall Cognitive Status: No family/caregiver present to determine baseline cognitive functioning Area of Impairment: Orientation;Attention;Following commands;Safety/judgement;Problem solving Orientation Level: Disoriented to;Place;Situation Current Attention Level: Focused Memory: Decreased short-term memory Following Commands: Follows one step commands inconsistently Safety/Judgement: Decreased awareness of safety;Decreased awareness of deficits   Problem Solving: Difficulty sequencing;Slow processing;Decreased initiation;Requires verbal cues;Requires tactile cues General Comments: difficult to determine baseline; pt perseverates throughotu session     General Comments General comments (skin integrity, edema, etc.): pt demo difficulty with visual tracking and conversion     Exercises        Assessment/Plan    PT Assessment Patient needs continued PT services  PT Diagnosis Acute pain;Abnormality of gait;Generalized weakness   PT Problem List Decreased strength;Decreased balance;Decreased mobility;Decreased activity tolerance;Decreased  coordination;Decreased cognition;Decreased knowledge of use of DME;Decreased safety awareness  PT Treatment Interventions Gait training;DME instruction;Functional mobility training;Therapeutic activities;Therapeutic exercise;Balance training;Neuromuscular re-education;Patient/family education;Cognitive remediation   PT Goals (Current goals can be found in the Care Plan section) Acute Rehab PT Goals Patient Stated Goal: to go home PT Goal Formulation: With patient Time For Goal Achievement: 04/10/14 Potential to Achieve Goals: Fair    Frequency Min 4X/week   Barriers to discharge   uncertain of home support    Co-evaluation               End of Session Equipment Utilized During Treatment: Gait belt Activity Tolerance: Patient tolerated treatment well Patient left: in chair;with call bell/phone within reach;with chair alarm set;with nursing/sitter in room Nurse Communication: Mobility status;Precautions         Time: 2694-8546 PT Time Calculation (min): 17 min   Charges:   PT Evaluation $Initial PT Evaluation Tier I: 1 Procedure PT Treatments $Gait Training: 8-22 mins   PT G CodesGustavus Bryant, West 04/03/2014, 12:59 PM

## 2014-04-03 NOTE — Progress Notes (Addendum)
[facility] PROGRESS NOTE  [name] JOI:[id number] DOB: 03/31/1966 DOA: 04/01/2014 PCP: [name], MD  Assessment/Plan: Principal Problem:   CVA (cerebral infarction) Active Problems:   Encephalopathy acute   Chronic pain syndrome   Reactive airway disease   UTI (urinary tract infection)   Overdose   Leukocytosis    1. Altered mental status secondary to hypoxia?, abnormal CT scan showing bilateral globus pallidus hypodensities, no intracranial hemorrhage, history of multiple sclerosis. Unable to do MRI of the brain because of agitation despite receiving Ativan.May need MRI under conscious sedation Patient completely disoriented today. Suspected drug overdose although urine drug screen only positive for benzodiazepines and negative for narcotics. Carboxyhemoglobin test was canceled. Discussed with Dr. Nicole Kindred, also requested psychiatric consult. We'll check TSH, B12, B1. The patient also has a urinary tract infection and this could be related to acute toxic metabolic encephalopathy. Patient had a low-grade fever on 6/25 likely secondary to UTI. Patient has been n.p.o. because of agitation risk of aspiration. With his history of chronic low back pain and fibromyalgia and restart Percocet. Restart Klonopin and place patient on a nicotine patch in case the patient is in withdrawal. 2. .Hypoxia resolved currently 97% on oxygen, we'll try to wean oxygen down to room air, chest x-ray shows atelectasisvs  pneumonia, or prior history of DVT or PE, resolved after receiving Narcan. Patient denies any intentional drug overdose. She does have a history of COPD. Continue when necessary Ventolin. 3. Unintentional overdose, continue one-on-one sitter by the bedside 4. UTI continue ciprofloxacin, will switch to by mouth when able to take food by mouth 5. Hyperglycemia, A1c 5.7 continue sliding scale insulin 6. Diet patient n.p.o. because of high risk of aspiration, will order SLP consult when  able swallow 7. Macrocytosis we'll check a vitamin B 12 and folate levels   Code Status: full  Family Communication: family updated about patient's clinical progress  Disposition Plan: Not stable for discharge     HPI/Subjective: Patient is disoriented and agitated, apparently very anxious and confused all night.  Objective: Filed Vitals:   04/02/14 2206 04/03/14 0038 04/03/14 0137 04/03/14 0447  BP: 142/71 119/97 158/88 141/97  Pulse: 105 100 107 106  Temp: 98.2 F (36.8 C) 98.5 F (36.9 C) 99.3 F (37.4 C) 98.8 F (37.1 C)  TempSrc: Oral Oral Oral Axillary  Resp: 18 16 20 20   Height:      Weight:      SpO2: 96% 97% 98% 98%    Intake/Output Summary (Last 24 hours) at 04/03/14 0817 Last data filed at 04/02/14 1451  Gross per 24 hour  Intake    840 ml  Output   1175 ml  Net   -335 ml    Exam:  HEART: Regular rate and rhythm; no murmurs rubs or gallops  BACK: No kyphosis or scoliosis; no CVA tenderness  ABDOMEN: Positive Bowel Sounds, Obese, soft non-tender; no masses, no organomegaly, no pannus; no intertriginous candida.  Rectal Exam: Not done  EXTREMITIES: No cyanosis, clubbing or edema; no ulcerations.  Genitalia: not examined  PULSES: 2+ and symmetric  SKIN: Normal hydration no rash or ulceration  CNS confused, oriented x 1, Speech Clear, CNII- XII intact, Sensory and Motor Function Intact, Cerebellar Fxn Intact, DTRs 2/4, Gait Deferred  Vascular: pulses palpable throughout    Data Reviewed: Basic Metabolic Panel:  Recent Labs Lab 04/01/14 2215  NA 136*  K 5.2  CL 94*  CO2 30  GLUCOSE 189*  BUN 5*  CREATININE 0.80  CALCIUM 8.8    Liver Function Tests:  Recent Labs Lab 04/01/14 2215  AST HEMOLYZED SPECIMEN, RESULTS MAY BE AFFECTED  ALT 11  ALKPHOS 140*  BILITOT <0.2*  PROT 7.5  ALBUMIN 3.3*   No results found for this basename: LIPASE, AMYLASE,  in the last 168 hours No results found for this basename: AMMONIA,  in the last 168  hours  CBC:  Recent Labs Lab 04/01/14 2215  WBC 17.9*  NEUTROABS 14.7*  HGB 17.3*  HCT 51.7*  MCV 102.6*  PLT 212    Cardiac Enzymes:  Recent Labs Lab 04/01/14 2215  TROPONINI <0.30   BNP (last 3 results) No results found for this basename: PROBNP,  in the last 8760 hours   CBG:  Recent Labs Lab 04/02/14 2107 04/02/14 2135 04/03/14 0040 04/03/14 0458 04/03/14 0813  GLUCAP 154* 165* 116* 160* 169*    Recent Results (from the past 240 hour(s))  MRSA PCR SCREENING     Status: None   Collection Time    04/02/14  6:30 AM      Result Value Ref Range Status   MRSA by PCR NEGATIVE  NEGATIVE Final   Comment:            The GeneXpert MRSA Assay (FDA     approved for NASAL specimens     only), is one component of a     comprehensive MRSA colonization     surveillance program. It is not     intended to diagnose MRSA     infection nor to guide or     monitor treatment for     MRSA infections.     Studies: Dg Chest 2 View  04/02/2014   CLINICAL DATA:  Shortness of breath. Altered mental status. COPD. Multiple sclerosis.  EXAM: CHEST  2 VIEW  COMPARISON:  07/23/2011  FINDINGS: Lower cervical plate and screw fixator. Cardiac and mediastinal margins appear normal. Bilateral lower lobe subsegmental atelectasis. Lungs appear otherwise clear. No pleural effusion identified.  IMPRESSION: 1. Bilateral subsegmental atelectasis in both lung bases. Otherwise negative exam.   Electronically Signed   By: Sherryl Barters M.D.   On: 04/02/2014 00:42   Ct Head Wo Contrast  04/02/2014   CLINICAL DATA:  Altered mental status. Patient found unresponsive at home. History of multiple sclerosis.  EXAM: CT HEAD WITHOUT CONTRAST  TECHNIQUE: Contiguous axial images were obtained from the base of the skull through the vertex without intravenous contrast.  COMPARISON:  07/24/2011  FINDINGS: Despite efforts by the technologist and patient, motion artifact is present on today's exam and could not  be eliminated. This reduces exam sensitivity and specificity. New sharply defined hypodensities along the globus pallidus nuclei bilaterally favoring symmetric chronic lacunar infarcts. These are not visible in 2012.  Otherwise, the brainstem, cerebellum, cerebral peduncles, thalamus, basal ganglia, basilar cisterns, and ventricular system appear within normal limits. No intracranial hemorrhage, mass lesion, or acute CVA.  IMPRESSION: 1. Small symmetric infarcts in both globus pallidus nuclei appear to be a new compared to the prior examination. This may be incidental although there are some associations for bilateral globus pallidus infarcts including poisoning by cyanide, methamphetamine, cocaine, and opiates.   Electronically Signed   By: Sherryl Barters M.D.   On: 04/02/2014 00:50    Scheduled Meds: . aspirin  300 mg Rectal Daily   Or  . aspirin  325 mg Oral Daily  . ciprofloxacin  400 mg Intravenous Q12H  . clonazePAM  0.5 mg  Oral BID  . enoxaparin (LOVENOX) injection  40 mg Subcutaneous Q24H  . folic acid  1 mg Oral Daily  . insulin aspart  0-9 Units Subcutaneous 6 times per day  . nicotine  14 mg Transdermal Daily   Continuous Infusions:   Principal Problem:   CVA (cerebral infarction) Active Problems:   Encephalopathy acute   Chronic pain syndrome   Reactive airway disease   UTI (urinary tract infection)   Overdose   Leukocytosis    Time spent: 40 minutes   [facility] Pager [phone]. If 8PM-8AM, please contact night-coverage at [email or web address], password [facility] 04/03/2014, 8:17 AM  LOS: 2 days

## 2014-04-04 LAB — URINE CULTURE: Colony Count: >=100000

## 2014-04-04 NOTE — Progress Notes (Signed)
Physical Therapy Treatment Patient Details Name: [name] MRN: [id number] DOB: 10-23-1965 Today's Date: 04/04/2014    History of Present Illness [name] is a 48 y.o. Female admitted 04/01/14 after her significant other found her sitting in the recliner unresponsive. She was found to be hypoxic by EMS with O2 saturations of 40 % and was placed on supplemental O2 with improvement.  In the ED when she arrived she was given Narcan IV X1 dose and became alert. On Interview, she adamantly denied any suicidal ideation or intent.  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 Pt's significant other reports that pt has incidents like this once a year.     PT Comments    Pt made progress with mobility today. Does have a whole body tremor (as seen with medication/alcohol withdraws/Parkinson's disease, etc), however has fine/gross motor control with mobility today. Needed cues on safety today with mobility. Pt and significant other report pt will have 24 hours assist/supervision at home and have all needed DME.    Follow Up Recommendations  Home health PT;Supervision/Assistance - 24 hour     Equipment Recommendations  Other (comment) (none)    Recommendations for Other Services Rehab consult;OT consult     Precautions / Restrictions Precautions Precautions: Fall Restrictions Weight Bearing Restrictions: No    Mobility  Bed Mobility Overal bed mobility: Needs Assistance Bed Mobility: Supine to Sit;Sit to Supine     Supine to sit: Supervision     General bed mobility comments: pt moving to edge of bed on seeing therapy walk into room, preparing to stand up and go. cues on technique and safety with bed mobility.  Transfers Overall transfer level: Needs assistance Equipment used: Rolling walker (2 wheeled) Transfers: Sit to/from Stand Sit to Stand: Min guard         General transfer  comment: cues on hand placement and general safety/to slow down with transfers.  Ambulation/Gait Ambulation/Gait assistance: Min assist;Min guard Ambulation Distance (Feet): 300 Feet (150 feet x2 reps) Assistive device: Rolling walker (2 wheeled);2 person hand held assist;None Gait Pattern/deviations: Step-through pattern;Narrow base of support;Shuffle (pt with whole body tremors, similar to ataxia however pt able to control her movements unlike with ataxia) Gait velocity: decreased Gait velocity interpretation: Below normal speed for age/gender General Gait Details: started with bil HHA progressing to no assist with first gait trail. unsteady gait with veering when walking without assistance/AD. used RW with second gait trial with min guard assist to supervision. Improved gait steadiness with walker. Pt reports having a walker at home.                        Modified Rankin (Stroke Patients Only)  Modified Rankin (Stroke Patients Only)  Pre-Morbid Rankin Score: (difficult to determine due to poor historian )  Modified Rankin: Moderately  disability         Cognition Arousal/Alertness: Awake/alert Behavior During Therapy: Flat affect;Impulsive Overall Cognitive Status: No family/caregiver present to determine baseline cognitive functioning Area of Impairment: Safety/judgement;Problem solving;Awareness;Memory   Current Attention Level: Sustained Memory: Decreased short-term memory Following Commands: Follows multi-step commands with increased time Safety/Judgement: Decreased awareness of deficits;Decreased awareness of safety Awareness: Emergent Problem Solving: Requires verbal cues;Difficulty sequencing General Comments: pt impulsive with most activity today, needed cues to "wait for [place] before doing ...". Did have some random statemetents she said throughout session that  were off topic and random in topic.       PT Goals (current goals can now be found in the care plan  section) Acute Rehab PT Goals Patient Stated Goal: to go home PT Goal Formulation: With patient Time For Goal Achievement: 04/10/14 Potential to Achieve Goals: Fair Progress towards PT goals: Progressing toward goals    Frequency  Min 4X/week    PT Plan Current plan remains appropriate       End of Session Equipment Utilized During Treatment: Gait belt Activity Tolerance: Patient tolerated treatment well Patient left: in bed;with nursing/sitter in room;with call bell/phone within reach     Time: 1355-1424 PT Time Calculation (min): 29 min  Charges:  $Gait Training: 23-37 mins                    G Codes:      Willow Ora, PTA Office- [phone]  Willow Ora 04/04/2014, 3:14 PM

## 2014-04-04 NOTE — Progress Notes (Signed)
I have discussed pt's case with PTA and agree with updated recommendations.   Jolyn Lent, PT, DPT Acute Rehabilitation Services Pager: [phone]

## 2014-04-04 NOTE — [place] Notes (Signed)
[facility] PROGRESS NOTE  TYIESHA BRACKNEY TKW:[id number] DOB: Feb 07, 1966 DOA: 04/01/2014 PCP: [name], MD  Assessment/Plan: Principal Problem:   CVA (cerebral infarction) Active Problems:   Encephalopathy acute   Chronic pain syndrome   Reactive airway disease   UTI (urinary tract infection)   Overdose   Leukocytosis    1. Altered mental status secondary to hypoxia?, abnormal CT scan showing bilateral globus pallidus hypodensities, no intracranial hemorrhage, history of multiple sclerosis.stable MRI of the brain  . Suspected drug overdose although urine drug screen only positive for benzodiazepines and negative for narcotics. Carboxyhemoglobin test was canceled. Discussed with Dr. Nicole Kindred, also requested psychiatric consult. normal TSH, B12, B1. The patient also has a urinary tract infection and this could be related to acute toxic metabolic encephalopathy. Made progress , mentation improved , started on po diet ,  2. With his history of chronic low back pain and fibromyalgia and restarted Percocet,Klonopin and nicotine patch in case the patient is in withdrawal. 3. .Hypoxia resolved currently 97% on oxygen, we'll try to wean oxygen down to room air, chest x-ray shows atelectasisvs pneumonia, or prior history of DVT or PE, resolved after receiving Narcan.  She does have a history of COPD. Continue when necessary Ventolin. 4. Unintentional overdose, continue one-on-one sitter by the bedside 5. UTI continue ciprofloxacin, will switch to by mouth when able to take food by mouth 6. Hyperglycemia, A1c 5.7 continue sliding scale insulin 7. Diet patient n.p.o. because of high risk of aspiration, will order SLP consult when able swallow 8. Macrocytosis we'll check a vitamin B 12 and folate levels   Code Status: full  Family Communication: family updated about patient's clinical progress  Disposition Plan: CIR vs home based on PT eval today    HPI/Subjective: Much more awake and  alert this am ,  Objective: Filed Vitals:   04/03/14 2100 04/04/14 0130 04/04/14 0540 04/04/14 1036  BP: 135/77 116/99 154/98 137/89  Pulse: 102 116 102 88  Temp: 97.7 F (36.5 C) 98.6 F (37 C) 98 F (36.7 C) 97.4 F (36.3 C)  TempSrc: Oral Oral Oral Oral  Resp: 18 18 18 18   Height:      Weight:      SpO2: 95% 95% 98% 98%   No intake or output data in the 24 hours ending 04/04/14 1050  Exam:  HEART: Regular rate and rhythm; no murmurs rubs or gallops  BACK: No kyphosis or scoliosis; no CVA tenderness  ABDOMEN: Positive Bowel Sounds, Obese, soft non-tender; no masses, no organomegaly, no pannus; no intertriginous candida.  Rectal Exam: Not done  EXTREMITIES: No cyanosis, clubbing or edema; no ulcerations.  Genitalia: not examined  PULSES: 2+ and symmetric  SKIN: Normal hydration no rash or ulceration  CNS confused, oriented x 3, Speech Clear, CNII- XII intact, Sensory and Motor Function Intact, Cerebellar Fxn Intact, DTRs 2/4, Gait Deferred  Vascular: pulses palpable throughout     Data Reviewed: Basic Metabolic Panel:  Recent Labs Lab 04/01/14 2215 04/03/14 1510  NA 136* 140  K 5.2 3.8  CL 94* 95*  CO2 30 29  GLUCOSE 189* 142*  BUN 5* 6  CREATININE 0.80 0.61  CALCIUM 8.8 9.9    Liver Function Tests:  Recent Labs Lab 04/01/14 2215 04/03/14 1510  AST HEMOLYZED SPECIMEN, RESULTS MAY BE AFFECTED 28  ALT 11 13  ALKPHOS 140* 127*  BILITOT <0.2* 0.7  PROT 7.5 7.9  ALBUMIN 3.3* 3.6   No results found for this basename: LIPASE, AMYLASE,  in the last 168 hours  Recent Labs Lab 04/03/14 1510  AMMONIA 29    CBC:  Recent Labs Lab 04/01/14 2215 04/03/14 1510  WBC 17.9* 14.1*  NEUTROABS 14.7*  --   HGB 17.3* 16.4*  HCT 51.7* 48.7*  MCV 102.6* 97.4  PLT 212 248    Cardiac Enzymes:  Recent Labs Lab 04/01/14 2215  TROPONINI <0.30   BNP (last 3 results) No results found for this basename: PROBNP,  in the last 8760 hours   CBG:  Recent  Labs Lab 04/03/14 0040 04/03/14 0458 04/03/14 0813 04/03/14 1148 04/03/14 1643  GLUCAP 116* 160* 169* 170* 153*    Recent Results (from the past 240 hour(s))  URINE CULTURE     Status: None   Collection Time    04/01/14 11:22 PM      Result Value Ref Range Status   Specimen Description URINE, CLEAN CATCH   Final   Special Requests NONE   Final   Culture  Setup Time     Final   Value: 04/02/2014 14:42     Performed at [place]     Final   Value: >=100,000 COLONIES/ML     Performed at [facility]   Culture     Final   Value: KLEBSIELLA PNEUMONIAE     Performed at [facility]   Report Status 04/04/2014 FINAL   Final   Organism ID, Bacteria KLEBSIELLA PNEUMONIAE   Final  MRSA PCR SCREENING     Status: None   Collection Time    04/02/14  6:30 AM      Result Value Ref Range Status   MRSA by PCR NEGATIVE  NEGATIVE Final   Comment:            The GeneXpert MRSA Assay (FDA     approved for NASAL specimens     only), is one component of a     comprehensive MRSA colonization     surveillance program. It is not     intended to diagnose MRSA     infection nor to guide or     monitor treatment for     MRSA infections.     Studies: Dg Chest 2 View  04/02/2014   CLINICAL DATA:  Shortness of breath. Altered mental status. COPD. Multiple sclerosis.  EXAM: CHEST  2 VIEW  COMPARISON:  07/23/2011  FINDINGS: Lower cervical plate and screw fixator. Cardiac and mediastinal margins appear normal. Bilateral lower lobe subsegmental atelectasis. Lungs appear otherwise clear. No pleural effusion identified.  IMPRESSION: 1. Bilateral subsegmental atelectasis in both lung bases. Otherwise negative exam.   Electronically Signed   By: Sherryl Barters M.D.   On: 04/02/2014 00:42   Ct Head Wo Contrast  04/02/2014   CLINICAL DATA:  Altered mental status. Patient found unresponsive at home. History of multiple sclerosis.  EXAM: CT HEAD WITHOUT CONTRAST   TECHNIQUE: Contiguous axial images were obtained from the base of the skull through the vertex without intravenous contrast.  COMPARISON:  07/24/2011  FINDINGS: Despite efforts by the technologist and patient, motion artifact is present on today's exam and could not be eliminated. This reduces exam sensitivity and specificity. New sharply defined hypodensities along the globus pallidus nuclei bilaterally favoring symmetric chronic lacunar infarcts. These are not visible in 2012.  Otherwise, the brainstem, cerebellum, cerebral peduncles, thalamus, basal ganglia, basilar cisterns, and ventricular system appear within normal limits. No intracranial hemorrhage, mass lesion, or acute CVA.  IMPRESSION: 1. Small symmetric infarcts in both globus pallidus nuclei appear to be a new compared to the prior examination. This may be incidental although there are some associations for bilateral globus pallidus infarcts including poisoning by cyanide, methamphetamine, cocaine, and opiates.   Electronically Signed   By: Sherryl Barters M.D.   On: 04/02/2014 00:50   Mr Brain Wo Contrast  04/03/2014   CLINICAL DATA:  48 year old female with tremor and altered mental status. Found down. Initial encounter. History of multiple sclerosis.  EXAM: MRI HEAD WITHOUT CONTRAST  MRA HEAD WITHOUT CONTRAST  TECHNIQUE: Multiplanar, multiecho pulse sequences of the brain and surrounding structures were obtained without intravenous contrast. Angiographic images of the head were obtained using MRA technique without contrast.  COMPARISON:  Head CT without contrast 04/02/2014. Brain MRI 07/24/2011.  FINDINGS: MRI HEAD FINDINGS  Cerebral volume is stable since 2012. Major intracranial vascular flow voids are stable. No restricted diffusion to suggest acute infarction. No midline shift, mass effect, evidence of mass lesion, ventriculomegaly, extra-axial collection or acute intracranial hemorrhage. Cervicomedullary junction and pituitary are within  normal limits. Susceptibility artifact in the cervical spine at C4 suggesting ACDF.  Bilateral globus pallidus T2 and FLAIR signal abnormality is new since 2012 and corresponds to the recent head CT findings. This appears chronic in nature. Other deep gray matter nuclei remain within normal limits.  Scattered bilateral mostly subcortical cerebral white matter T2 and FLAIR hyperintensity otherwise is stable. No cortical encephalomalacia. Brainstem, cerebellum, and visible internal auditory structures are normal.  Visualized paranasal sinuses and mastoids are clear. Stable orbits soft tissues with postoperative changes to both globes. Visualized scalp soft tissues are within normal limits.  MRA HEAD FINDINGS  Antegrade flow in the posterior circulation with dominant appearing distal right vertebral artery. The left vertebral artery appears to functionally terminate in PICA. Incidental fenestrated distal extracranial right vertebral artery. No basilar artery stenosis. SCA and PCA origins are within normal limits. Both posterior communicating arteries are present. Bilateral PCA branches are within normal limits.  Antegrade flow in both ICA siphons. Tortuous distal cervical ICAs. No ICA stenosis. Ophthalmic and posterior communicating artery origins are normal. MCA and ACA origins are normal. Anterior communicating artery, visualized ACA, and bilateral MCA branches are within normal limits. MCA M1 segments are within normal limits.  IMPRESSION: 1.  No acute intracranial abnormality. 2. Bilateral globus pallidus signal abnormality appears chronic but is new since 2012. This appearance is nonspecific but often associated with toxic exposure as described in the recent CT. 3. Otherwise stable non contrast MRI appearance of the brain since 2012. 4. Negative intracranial MRA.   Electronically Signed   By: Lars Pinks M.D.   On: 04/03/2014 19:27   Dg Swallowing Func-speech Pathology  04/03/2014   Amy Dionicia Abler, CCC-SLP      04/03/2014  2:34 PM Objective Swallowing Evaluation: Modified Barium Swallowing Study   Patient Details  Name: TOM RAGSDALE MRN: [id number] Date of Birth: 1966-06-28  Today's Date: 04/03/2014 Time: 7654-6503 SLP Time Calculation (min): 33 min  Past Medical History:  Past Medical History  Diagnosis Date  . Anxiety   . Depression   . COPD (chronic obstructive pulmonary disease)   . MS (multiple sclerosis)   . Fibromyalgia   . Impaired fasting glucose   . 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 Eating disorder   . Chronic low back pain   . PONV (postoperative nausea and vomiting)   . Chronic bronchitis     "  yearly" (02/11/2013)  . Borderline diabetes   . GERD (gastroesophageal reflux disease)   . Migraine     "I use to" (02/11/2013)  . Arthritis     "hands & feet" (02/11/2013)  . Peripheral neuropathy     [name] 02/11/2013  . B12 deficiency anemia     [name] 02/11/2013  . Chronic pain syndrome     [name] 02/11/2013  . UTI (lower urinary tract infection) 02/11/2013    [name] 02/11/2013  . Chronic edema     BLE/notes 02/11/2013   Past Surgical History:  Past Surgical History  Procedure Laterality Date  . Abdominal hysterectomy  ~ 1987; ~ 2004    "[place]; [place]" (02/11/2013)  . Cesarean section  0388; 1984; 1986  . Anterior cervical decomp/discectomy fusion  2009; 2011  . Tonsillectomy  1990's?  . Cataract extraction w/phaco  06/20/2011    Procedure: CATARACT EXTRACTION PHACO AND INTRAOCULAR LENS  PLACEMENT (IOC);  Surgeon: [name] T. Gershon Crane;  Location: [facility];   Service: Ophthalmology;  Laterality: Left;  CDE 1.81  . Cataract extraction w/phaco  07/04/2011    Procedure: CATARACT EXTRACTION PHACO AND INTRAOCULAR LENS  PLACEMENT (IOC);  Surgeon: [name] T. Gershon Crane;  Location: [facility];   Service: Ophthalmology;  Laterality: Right;  CDE: 1.76  . Yag laser application Right 06/05/33    Procedure: YAG LASER APPLICATION;  Surgeon: [name] T. Gershon Crane,  MD;  Location: [facility];  Service: Ophthalmology;  Laterality:  Right;  .  Yag laser application Left 06/25/9149    Procedure: YAG LASER APPLICATION;  Surgeon: [name] T. Gershon Crane,  MD;  Location: [facility];  Service: Ophthalmology;  Laterality:  Left;  . Appendectomy     HPI:  ALBERTO SCHOCH is a 48 y.o. female with a history of chronic  pain, COPD, ?MS, depression who was found down earlier tonight  with an oxygen saturation of 49%. In the ER, she was given Narcan  with good response and becoming more awake and interactive. MD  reports "bilateral globus pallidus injury in the setting of  likely drug overdose. This pattern of injury as well described in  drug overdose with narcotics, cocaine." Also notes that pt  confabulates.  Pt CT showed globus pallidus nuclei bilateral  cvas.  BSE ordered as pt failed stroke swallow screen.       Assessment / Plan / Recommendation Clinical Impression  Dysphagia Diagnosis: Within Functional Limits Clinical impression: Pt demonstrated an oropharyngeal swallow  essentially within normal limits. No delay noted; pt does  consistently swallow avg of 2x per bite/ sip but no significant  residue noted, despite ACDF. Given these results pt's risk of  aspiration is none-mild. Recommend initiating regular diet, thin  liquids. Pt did demonstrate some difficulties with barium pill;  would advise starting meds with liquids and switch to puree if  difficulties arise. Given history of GERD, also recommend reflux  precautions- alternating food with liquid, sit upright 30-60 mins  after meal; worth noting however that no cervical esophageal  motility issues noted on MBS. ST will continue to follow for  cognitive needs.    Treatment Recommendation  Therapy as outlined in treatment plan below    Diet Recommendation Regular;Thin liquid   Liquid Administration via: Cup;Straw Medication Administration: Whole meds with liquid Supervision: Patient able to self feed;Intermittent supervision  to cue for compensatory strategies Compensations: Slow rate;Small sips/bites;Follow solids  with  liquid Postural Changes and/or Swallow Maneuvers: Seated upright 90  degrees;Upright 30-60 min after meal  Other  Recommendations Recommended Consults: MBS Oral Care Recommendations: Oral care BID   Follow Up Recommendations  Inpatient Rehab;Home health SLP;Other (comment) (CIR or home  health would be appropriate at PLOF)    Frequency and Duration min 2x/week  2 weeks   Pertinent Vitals/Pain n/a    SLP Swallow Goals     General HPI: KASI LASKY is a 48 y.o. female with a history of  chronic pain, COPD, ?MS, depression who was found down earlier  tonight with an oxygen saturation of 49%. In the ER, she was  given Narcan with good response and becoming more awake and  interactive. MD reports "bilateral globus pallidus injury in the  setting of likely drug overdose. This pattern of injury as well  described in drug overdose with narcotics, cocaine." Also notes  that pt confabulates.  Pt CT showed globus pallidus nuclei  bilateral cvas.  BSE ordered as pt failed stroke swallow screen.    Type of Study: Modified Barium Swallowing Study Reason for Referral: Objectively evaluate swallowing function Previous Swallow Assessment: BSE- pt then made NPO Diet Prior to this Study: NPO Temperature Spikes Noted: No Respiratory Status: Nasal cannula History of Recent Intubation: No Behavior/Cognition: Alert;Cooperative;Pleasant mood Oral Cavity - Dentition: Dentures, top Oral Motor / Sensory Function: Within functional limits Self-Feeding Abilities: Able to feed self Patient Positioning: Upright in chair Baseline Vocal Quality: Clear Anatomy: Other (Comment) (visualized ACDF) Pharyngeal Secretions: Not observed secondary MBS    Reason for Referral Objectively evaluate swallowing function   Oral Phase Oral Preparation/Oral Phase Oral Phase: WFL   Pharyngeal Phase Pharyngeal Phase Pharyngeal Phase: Within functional limits  Cervical Esophageal Phase    GO    Cervical Esophageal Phase Cervical Esophageal Phase: Darrold Junker, MA, CCC-SLP 04/03/2014, 2:33 PM    Mr Jodene Nam Head/brain Wo Cm  04/03/2014   CLINICAL DATA:  48 year old female with tremor and altered mental status. Found down. Initial encounter. History of multiple sclerosis.  EXAM: MRI HEAD WITHOUT CONTRAST  MRA HEAD WITHOUT CONTRAST  TECHNIQUE: Multiplanar, multiecho pulse sequences of the brain and surrounding structures were obtained without intravenous contrast. Angiographic images of the head were obtained using MRA technique without contrast.  COMPARISON:  Head CT without contrast 04/02/2014. Brain MRI 07/24/2011.  FINDINGS: MRI HEAD FINDINGS  Cerebral volume is stable since 2012. Major intracranial vascular flow voids are stable. No restricted diffusion to suggest acute infarction. No midline shift, mass effect, evidence of mass lesion, ventriculomegaly, extra-axial collection or acute intracranial hemorrhage. Cervicomedullary junction and pituitary are within normal limits. Susceptibility artifact in the cervical spine at C4 suggesting ACDF.  Bilateral globus pallidus T2 and FLAIR signal abnormality is new since 2012 and corresponds to the recent head CT findings. This appears chronic in nature. Other deep gray matter nuclei remain within normal limits.  Scattered bilateral mostly subcortical cerebral white matter T2 and FLAIR hyperintensity otherwise is stable. No cortical encephalomalacia. Brainstem, cerebellum, and visible internal auditory structures are normal.  Visualized paranasal sinuses and mastoids are clear. Stable orbits soft tissues with postoperative changes to both globes. Visualized scalp soft tissues are within normal limits.  MRA HEAD FINDINGS  Antegrade flow in the posterior circulation with dominant appearing distal right vertebral artery. The left vertebral artery appears to functionally terminate in PICA. Incidental fenestrated distal extracranial right vertebral artery. No basilar artery stenosis. SCA and PCA origins are within  normal limits. Both posterior communicating arteries are present.  Bilateral PCA branches are within normal limits.  Antegrade flow in both ICA siphons. Tortuous distal cervical ICAs. No ICA stenosis. Ophthalmic and posterior communicating artery origins are normal. MCA and ACA origins are normal. Anterior communicating artery, visualized ACA, and bilateral MCA branches are within normal limits. MCA M1 segments are within normal limits.  IMPRESSION: 1.  No acute intracranial abnormality. 2. Bilateral globus pallidus signal abnormality appears chronic but is new since 2012. This appearance is nonspecific but often associated with toxic exposure as described in the recent CT. 3. Otherwise stable non contrast MRI appearance of the brain since 2012. 4. Negative intracranial MRA.   Electronically Signed   By: Lars Pinks M.D.   On: 04/03/2014 19:27    Scheduled Meds: . aspirin  300 mg Rectal Daily   Or  . aspirin  325 mg Oral Daily  . ciprofloxacin  500 mg Oral BID  . clonazePAM  0.5 mg Oral BID  . enoxaparin (LOVENOX) injection  40 mg Subcutaneous Q24H  . folic acid  1 mg Oral Daily  . nicotine  14 mg Transdermal Daily   Continuous Infusions: . sodium chloride 1,000 mL (04/03/14 2351)    Principal Problem:   CVA (cerebral infarction) Active Problems:   Encephalopathy acute   Chronic pain syndrome   Reactive airway disease   UTI (urinary tract infection)   Overdose   Leukocytosis    Time spent: 40 minutes   [facility] Pager [phone]. If 8PM-8AM, please contact night-coverage at [email or web address], password [facility] 04/04/2014, 10:50 AM  LOS: 3 days

## 2014-04-04 NOTE — [place] Notes (Signed)
Occupational Therapy Treatment Patient Details Name: [name] MRN: [id number] DOB: April 25, 1966 Today's Date: 04/04/2014    History of present illness [name] is a 48 y.o. Female admitted 04/01/14 after her significant other found her sitting in the recliner unresponsive. She was found to be hypoxic by EMS with O2 saturations of 40 % and was placed on supplemental O2 with improvement. In the ED when she arrived she was given Narcan IV X1 dose and became alert. On Interview, she adamantly denied any suicidal ideation or intent. 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 Pt's significant other reports that pt has incidents like this once a year.   OT comments  Pt seen for ADL session and functional mobility. Pt continues to have full body tremors, however she reports that they "come and go" today. Pt able to ambulate to toilet and stand at sink for grooming tasks with [employer] assist using RW. Pt is adamant about leaving tomorrow and admitted that it would not be safe, however continues to state that she will leave "no matter what." Pt would continue to benefit from skilled OT to promote independence with ADLs.    Follow Up Recommendations  Home health OT;Supervision/Assistance - 24 hour    Equipment Recommendations  None recommended by OT       Precautions / Restrictions Precautions Precautions: Fall Restrictions Weight Bearing Restrictions: No       Mobility Bed Mobility Overal bed mobility: Needs Assistance Bed Mobility: Supine to Sit;Sit to Supine     Supine to sit: Supervision Sit to supine: Supervision   General bed mobility comments: Pt continues to move impulsively despite verbal cues for safety.   Transfers Overall transfer level: Needs assistance Equipment used: Rolling walker (2 wheeled) Transfers: Sit to/from Stand Sit to Stand: Min guard         General  transfer comment: cues on hand placement and general safety/to slow down with transfers.        ADL       Grooming: Min Dispensing optician: Min guard;Ambulation;RW;Comfort height toilet;Cueing for safety;Cueing for sequencing           Functional mobility during ADLs: Min guard;Rolling walker;Cueing for safety;Cueing for sequencing General ADL Comments: Pt continues to require max VC's for safety during ADLs due to impulsivity. Pt states that she is going home tomorrow "at noon and no one is stopping me." Asked pt if she thought she could be safe at home and she replied "No. But I have to go home." Educated pt on safety during transfers and with use of DME.       Vision                 Additional Comments: Further assessed vision in functional context with no apparent visual deficits.           Cognition  Arousal/Alertness: Awake/Alert Behavior During Therapy: Flat affect;Impulsive Overall Cognitive Status: Impaired/Different from baseline Area of Impairment: Safety/judgement;Problem solving;Awareness;Memory   Current Attention Level: Sustained Memory: Decreased short-term memory  Following Commands: Follows multi-step commands with increased time Safety/Judgement: Decreased awareness of deficits;Decreased awareness of safety Awareness: Emergent Problem Solving: Requires verbal cues;Difficulty sequencing General Comments: pt impulsive despite multiple advanced verbal cues to "wait for OT" prior to mobilizing. Pt "flops" down  quickly to sit even with max verbal cues to reach back and sit slowly.                  Pertinent Vitals/ Pain       NAD         Frequency Min 2X/week     Progress Toward Goals  OT Goals(current goals can now be found in the care plan section)  Progress towards OT goals: Progressing toward goals  Acute Rehab OT Goals Patient Stated Goal: to go home  Plan Discharge plan needs to be updated        End of Session Equipment Utilized During Treatment: Rolling walker;Gait belt   Activity Tolerance Patient tolerated treatment well   Patient Left in bed;with call bell/phone within reach;with nursing/sitter in room           Time: 1641-1705 OT Time Calculation (min): 24 min  Charges: OT General Charges $OT Visit: 1 Procedure OT Treatments $Self Care/Home Management : 23-37 mins  Juluis Rainier [phone] 04/04/2014, 5:41 PM

## 2014-04-04 NOTE — Progress Notes (Signed)
Speech Language Pathology Treatment: Dysphagia  Patient Details Name: [name] MRN: [id number] DOB: [birth year] Today's Date: 04/04/2014 Time: 1010-1017 SLP Time Calculation (min): 7 min  Assessment / Plan / Recommendation Clinical Impression  Skilled intervention for safety with recommendations after MBS yesterday.  No indications of oropharyngeal deficits.  She needed mod cues to recall precautions and implement.  May need encouragement for adequate intake.  No further needs for swallow; continue cognitive goals.   HPI HPI: [name] is a 48 y.o. female with a history of chronic pain, COPD, ?MS, depression who was found down earlier tonight with an oxygen saturation of 49%. In the ER, she was given Narcan with good response and becoming more awake and interactive. MD reports "bilateral globus pallidus injury in the setting of likely drug overdose. This pattern of injury as well described in drug overdose with narcotics, cocaine." Also notes that pt confabulates.  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  BSE ordered as pt failed stroke swallow screen.     Pertinent Vitals WDL  SLP Plan  Discharge SLP treatment due to (comment) (D/C swallow goals)    Recommendations Diet recommendations: Regular;Thin liquid Liquids provided via: Cup;Straw Medication Administration: Whole meds with liquid Supervision: Patient able to self feed Compensations: Slow rate;Small sips/bites;Follow solids with liquid Postural Changes and/or Swallow Maneuvers: Seated upright 90 degrees;Upright 30-60 min after meal              Oral Care Recommendations: Oral care BID Follow up Recommendations:  (none for swallow) Plan: Discharge SLP treatment due to (comment) (D/C swallow goals)    GO     Orbie Pyo Colvin Caroli.Ed [employer] [phone]  04/04/2014

## 2014-04-05 MED ORDER — CIPROFLOXACIN HCL 500 MG PO TABS: 500.0000 mg | ORAL_TABLET | Freq: Two times a day (BID) | ORAL | Status: DC

## 2014-04-05 MED ORDER — NICOTINE 14 MG/24HR TD PT24: 14.0000 mg | MEDICATED_PATCH | Freq: Every day | TRANSDERMAL | Status: DC

## 2014-04-05 MED ORDER — FOLIC ACID 1 MG PO TABS: 1.0000 mg | ORAL_TABLET | Freq: Every day | ORAL | Status: DC

## 2014-04-05 NOTE — Progress Notes (Signed)
Pt d/c to home by car with family. Assessment stable. Prescriptions given. 

## 2014-04-05 NOTE — Discharge Summary (Addendum)
Physician Discharge Summary  [name] MRN: [id number] DOB/AGE: 1965-10-18 48 y.o.  PCP: [name], MD   Admit date: 04/01/2014 Discharge date: 04/05/2014  Discharge Diagnoses:  Altered mental status secondary to drug overdose with polypharmacy Active Problems:   Encephalopathy acute   Chronic pain syndrome   Reactive airway disease   UTI (urinary tract infection)   Overdose   Leukocytosis  Follow recommendations #1 patient to follow up with PCP in 5-7 days #2 follow up in the pain clinic in 3 months #3 recheck TSH, free T4 in 2 months    Medication List         albuterol 108 (90 BASE) MCG/ACT inhaler  Commonly known as:  PROVENTIL HFA;VENTOLIN HFA  Inhale 2 puffs into the lungs every 6 (six) hours as needed for wheezing or shortness of breath (copd).     albuterol (2.5 MG/3ML) 0.083% nebulizer solution  Commonly known as:  PROVENTIL  Take 2.5 mg by nebulization every 4 (four) hours as needed for wheezing or shortness of breath (copd).     ciprofloxacin 500 MG tablet  Commonly known as:  CIPRO  Take 1 tablet (500 mg total) by mouth 2 (two) times daily.     citalopram 20 MG tablet  Commonly known as:  CELEXA  Take 20 mg by mouth daily at 12 noon.     clonazePAM 0.5 MG tablet  Commonly known as:  KLONOPIN  Take 0.5 mg by mouth 2 (two) times daily as needed for anxiety.     cyclobenzaprine 10 MG tablet  Commonly known as:  FLEXERIL  Take 10 mg by mouth at bedtime.     folic acid 1 MG tablet  Commonly known as:  FOLVITE  Take 1 tablet (1 mg total) by mouth daily.     gabapentin 300 MG capsule  Commonly known as:  NEURONTIN  Take 300 mg by mouth See admin instructions. Starting 04/01/14 take 1 capsule (300 mg) twice daily for 1-2 weeks, then take 1 capsule (300 mg) three times daily     meloxicam 15 MG tablet  Commonly known as:  MOBIC  Take 15 mg by mouth daily at 12 noon.     multivitamin with minerals Tabs tablet  Take 1 tablet by mouth daily  at 12 noon.     nicotine 14 mg/24hr patch  Commonly known as:  NICODERM CQ - dosed in mg/24 hours  Place 1 patch (14 mg total) onto the skin daily.     oxyCODONE-acetaminophen 10-325 MG per tablet  Commonly known as:  PERCOCET  Take 1 tablet by mouth every 6 (six) hours as needed for pain (no greater than 4 per day. don't take if drowsy).     potassium chloride 10 MEQ tablet  Commonly known as:  K-DUR  Take 20 mEq by mouth daily at 12 noon.     vitamin B-12 1000 MCG tablet  Commonly known as:  CYANOCOBALAMIN  Take 1,000 mcg by mouth daily at 12 noon.        Discharge Condition: Stable  Disposition: 01-Home or Self Care   Consults: * Psychiatry Neurology   Significant Diagnostic Studies: Dg Chest 2 View  04/02/2014   CLINICAL DATA:  Shortness of breath. Altered mental status. COPD. Multiple sclerosis.  EXAM: CHEST  2 VIEW  COMPARISON:  07/23/2011  FINDINGS: Lower cervical plate and screw fixator. Cardiac and mediastinal margins appear normal. Bilateral lower lobe subsegmental atelectasis. Lungs appear otherwise clear. No pleural effusion identified.  IMPRESSION: 1. Bilateral subsegmental atelectasis in both lung bases. Otherwise negative exam.   Electronically Signed   By: Sherryl Barters M.D.   On: 04/02/2014 00:42   Ct Head Wo Contrast  04/02/2014   CLINICAL DATA:  Altered mental status. Patient found unresponsive at home. History of multiple sclerosis.  EXAM: CT HEAD WITHOUT CONTRAST  TECHNIQUE: Contiguous axial images were obtained from the base of the skull through the vertex without intravenous contrast.  COMPARISON:  07/24/2011  FINDINGS: Despite efforts by the technologist and patient, motion artifact is present on today's exam and could not be eliminated. This reduces exam sensitivity and specificity. New sharply defined hypodensities along the globus pallidus nuclei bilaterally favoring symmetric chronic lacunar infarcts. These are not visible in 2012.  Otherwise, the  brainstem, cerebellum, cerebral peduncles, thalamus, basal ganglia, basilar cisterns, and ventricular system appear within normal limits. No intracranial hemorrhage, mass lesion, or acute CVA.  IMPRESSION: 1. Small symmetric infarcts in both globus pallidus nuclei appear to be a new compared to the prior examination. This may be incidental although there are some associations for bilateral globus pallidus infarcts including poisoning by cyanide, methamphetamine, cocaine, and opiates.   Electronically Signed   By: Sherryl Barters M.D.   On: 04/02/2014 00:50   Mr Brain Wo Contrast  04/03/2014   CLINICAL DATA:  48 year old female with tremor and altered mental status. Found down. Initial encounter. History of multiple sclerosis.  EXAM: MRI HEAD WITHOUT CONTRAST  MRA HEAD WITHOUT CONTRAST  TECHNIQUE: Multiplanar, multiecho pulse sequences of the brain and surrounding structures were obtained without intravenous contrast. Angiographic images of the head were obtained using MRA technique without contrast.  COMPARISON:  Head CT without contrast 04/02/2014. Brain MRI 07/24/2011.  FINDINGS: MRI HEAD FINDINGS  Cerebral volume is stable since 2012. Major intracranial vascular flow voids are stable. No restricted diffusion to suggest acute infarction. No midline shift, mass effect, evidence of mass lesion, ventriculomegaly, extra-axial collection or acute intracranial hemorrhage. Cervicomedullary junction and pituitary are within normal limits. Susceptibility artifact in the cervical spine at C4 suggesting ACDF.  Bilateral globus pallidus T2 and FLAIR signal abnormality is new since 2012 and corresponds to the recent head CT findings. This appears chronic in nature. Other deep gray matter nuclei remain within normal limits.  Scattered bilateral mostly subcortical cerebral white matter T2 and FLAIR hyperintensity otherwise is stable. No cortical encephalomalacia. Brainstem, cerebellum, and visible internal auditory  structures are normal.  Visualized paranasal sinuses and mastoids are clear. Stable orbits soft tissues with postoperative changes to both globes. Visualized scalp soft tissues are within normal limits.  MRA HEAD FINDINGS  Antegrade flow in the posterior circulation with dominant appearing distal right vertebral artery. The left vertebral artery appears to functionally terminate in PICA. Incidental fenestrated distal extracranial right vertebral artery. No basilar artery stenosis. SCA and PCA origins are within normal limits. Both posterior communicating arteries are present. Bilateral PCA branches are within normal limits.  Antegrade flow in both ICA siphons. Tortuous distal cervical ICAs. No ICA stenosis. Ophthalmic and posterior communicating artery origins are normal. MCA and ACA origins are normal. Anterior communicating artery, visualized ACA, and bilateral MCA branches are within normal limits. MCA M1 segments are within normal limits.  IMPRESSION: 1.  No acute intracranial abnormality. 2. Bilateral globus pallidus signal abnormality appears chronic but is new since 2012. This appearance is nonspecific but often associated with toxic exposure as described in the recent CT. 3. Otherwise stable non contrast MRI appearance of the  brain since 2012. 4. Negative intracranial MRA.   Electronically Signed   By: Lars Pinks M.D.   On: 04/03/2014 19:27   Dg Swallowing Func-speech Pathology  04/03/2014   Amy Dionicia Abler, CCC-SLP     04/03/2014  2:34 PM Objective Swallowing Evaluation: Modified Barium Swallowing Study   Patient Details  Name: [name] MRN: [id number] Date of Birth: 10-Jan-1966  Today's Date: 04/03/2014 Time: 0071-2197 SLP Time Calculation (min): 33 min  Past Medical History:  Past Medical History  Diagnosis Date  . Anxiety   . Depression   . COPD (chronic obstructive pulmonary disease)   . MS (multiple sclerosis)   . Fibromyalgia   . Impaired fasting glucose   . 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 Eating disorder   . Chronic low back pain   . PONV (postoperative nausea and vomiting)   . Chronic bronchitis     "yearly" (02/11/2013)  . Borderline diabetes   . GERD (gastroesophageal reflux disease)   . Migraine     "I use to" (02/11/2013)  . Arthritis     "hands & feet" (02/11/2013)  . Peripheral neuropathy     [name] 02/11/2013  . B12 deficiency anemia     [name] 02/11/2013  . Chronic pain syndrome     [name] 02/11/2013  . UTI (lower urinary tract infection) 02/11/2013    [name] 02/11/2013  . Chronic edema     BLE/notes 02/11/2013   Past Surgical History:  Past Surgical History  Procedure Laterality Date  . Abdominal hysterectomy  ~ 1987; ~ 2004    "[place]; [place]" (02/11/2013)  . Cesarean section  5883; 1984; 1986  . Anterior cervical decomp/discectomy fusion  2009; 2011  . Tonsillectomy  1990's?  . Cataract extraction w/phaco  06/20/2011    Procedure: CATARACT EXTRACTION PHACO AND INTRAOCULAR LENS  PLACEMENT (IOC);  Surgeon: [name] T. Gershon Crane;  Location: [facility];   Service: Ophthalmology;  Laterality: Left;  CDE 1.81  . Cataract extraction w/phaco  07/04/2011    Procedure: CATARACT EXTRACTION PHACO AND INTRAOCULAR LENS  PLACEMENT (IOC);  Surgeon: [name] T. Gershon Crane;  Location: [facility];   Service: Ophthalmology;  Laterality: Right;  CDE: 1.76  . Yag laser application Right 2/54/9826    Procedure: YAG LASER APPLICATION;  Surgeon: [name] T. Gershon Crane,  MD;  Location: [facility];  Service: Ophthalmology;  Laterality:  Right;  . Yag laser application Left 01/22/8308    Procedure: YAG LASER APPLICATION;  Surgeon: [name] T. Gershon Crane,  MD;  Location: [facility];  Service: Ophthalmology;  Laterality:  Left;  . Appendectomy     HPI:  RANIYA GOLEMBESKI is a 47 y.o. female with a history of chronic  pain, COPD, ?MS, depression who was found down earlier tonight  with an oxygen saturation of 49%. In the ER, she was given Narcan  with good response and becoming more awake and interactive. MD  reports "bilateral globus pallidus  injury in the setting of  likely drug overdose. This pattern of injury as well described in  drug overdose with narcotics, cocaine." Also notes that pt  confabulates.  Pt CT showed globus pallidus nuclei bilateral  cvas.  BSE ordered as pt failed stroke swallow screen.       Assessment / Plan / Recommendation Clinical Impression  Dysphagia Diagnosis: Within Functional Limits Clinical impression: Pt demonstrated an oropharyngeal swallow  essentially within normal limits. No delay noted; pt does  consistently swallow avg of 2x per  bite/ sip but no significant  residue noted, despite ACDF. Given these results pt's risk of  aspiration is none-mild. Recommend initiating regular diet, thin  liquids. Pt did demonstrate some difficulties with barium pill;  would advise starting meds with liquids and switch to puree if  difficulties arise. Given history of GERD, also recommend reflux  precautions- alternating food with liquid, sit upright 30-60 mins  after meal; worth noting however that no cervical esophageal  motility issues noted on MBS. ST will continue to follow for  cognitive needs.    Treatment Recommendation  Therapy as outlined in treatment plan below    Diet Recommendation Regular;Thin liquid   Liquid Administration via: Cup;Straw Medication Administration: Whole meds with liquid Supervision: Patient able to self feed;Intermittent supervision  to cue for compensatory strategies Compensations: Slow rate;Small sips/bites;Follow solids with  liquid Postural Changes and/or Swallow Maneuvers: Seated upright 90  degrees;Upright 30-60 min after meal    Other  Recommendations Recommended Consults: MBS Oral Care Recommendations: Oral care BID   Follow Up Recommendations  Inpatient Rehab;Home health SLP;Other (comment) (CIR or home  health would be appropriate at PLOF)    Frequency and Duration min 2x/week  2 weeks   Pertinent Vitals/Pain n/a    SLP Swallow Goals     General HPI: KENZLEY KE is a 48 y.o. female with a  history of  chronic pain, COPD, ?MS, depression who was found down earlier  tonight with an oxygen saturation of 49%. In the ER, she was  given Narcan with good response and becoming more awake and  interactive. MD reports "bilateral globus pallidus injury in the  setting of likely drug overdose. This pattern of injury as well  described in drug overdose with narcotics, cocaine." Also notes  that pt confabulates.  Pt CT showed globus pallidus nuclei  bilateral cvas.  BSE ordered as pt failed stroke swallow screen.    Type of Study: Modified Barium Swallowing Study Reason for Referral: Objectively evaluate swallowing function Previous Swallow Assessment: BSE- pt then made NPO Diet Prior to this Study: NPO Temperature Spikes Noted: No Respiratory Status: Nasal cannula History of Recent Intubation: No Behavior/Cognition: Alert;Cooperative;Pleasant mood Oral Cavity - Dentition: Dentures, top Oral Motor / Sensory Function: Within functional limits Self-Feeding Abilities: Able to feed self Patient Positioning: Upright in chair Baseline Vocal Quality: Clear Anatomy: Other (Comment) (visualized ACDF) Pharyngeal Secretions: Not observed secondary MBS    Reason for Referral Objectively evaluate swallowing function   Oral Phase Oral Preparation/Oral Phase Oral Phase: WFL   Pharyngeal Phase Pharyngeal Phase Pharyngeal Phase: Within functional limits  Cervical Esophageal Phase    GO    Cervical Esophageal Phase Cervical Esophageal Phase: Darrold Junker, MA, CCC-SLP 04/03/2014, 2:33 PM    Mr Jodene Nam Head/brain Wo Cm  04/03/2014   CLINICAL DATA:  48 year old female with tremor and altered mental status. Found down. Initial encounter. History of multiple sclerosis.  EXAM: MRI HEAD WITHOUT CONTRAST  MRA HEAD WITHOUT CONTRAST  TECHNIQUE: Multiplanar, multiecho pulse sequences of the brain and surrounding structures were obtained without intravenous contrast. Angiographic images of the head were obtained using MRA technique  without contrast.  COMPARISON:  Head CT without contrast 04/02/2014. Brain MRI 07/24/2011.  FINDINGS: MRI HEAD FINDINGS  Cerebral volume is stable since 2012. Major intracranial vascular flow voids are stable. No restricted diffusion to suggest acute infarction. No midline shift, mass effect, evidence of mass lesion, ventriculomegaly, extra-axial collection or  acute intracranial hemorrhage. Cervicomedullary junction and pituitary are within normal limits. Susceptibility artifact in the cervical spine at C4 suggesting ACDF.  Bilateral globus pallidus T2 and FLAIR signal abnormality is new since 2012 and corresponds to the recent head CT findings. This appears chronic in nature. Other deep gray matter nuclei remain within normal limits.  Scattered bilateral mostly subcortical cerebral white matter T2 and FLAIR hyperintensity otherwise is stable. No cortical encephalomalacia. Brainstem, cerebellum, and visible internal auditory structures are normal.  Visualized paranasal sinuses and mastoids are clear. Stable orbits soft tissues with postoperative changes to both globes. Visualized scalp soft tissues are within normal limits.  MRA HEAD FINDINGS  Antegrade flow in the posterior circulation with dominant appearing distal right vertebral artery. The left vertebral artery appears to functionally terminate in PICA. Incidental fenestrated distal extracranial right vertebral artery. No basilar artery stenosis. SCA and PCA origins are within normal limits. Both posterior communicating arteries are present. Bilateral PCA branches are within normal limits.  Antegrade flow in both ICA siphons. Tortuous distal cervical ICAs. No ICA stenosis. Ophthalmic and posterior communicating artery origins are normal. MCA and ACA origins are normal. Anterior communicating artery, visualized ACA, and bilateral MCA branches are within normal limits. MCA M1 segments are within normal limits.  IMPRESSION: 1.  No acute intracranial abnormality.  2. Bilateral globus pallidus signal abnormality appears chronic but is new since 2012. This appearance is nonspecific but often associated with toxic exposure as described in the recent CT. 3. Otherwise stable non contrast MRI appearance of the brain since 2012. 4. Negative intracranial MRA.   Electronically Signed   By: Lars Pinks M.D.   On: 04/03/2014 19:27       Microbiology: Recent Results (from the past 240 hour(s))  URINE CULTURE     Status: None   Collection Time    04/01/14 11:22 PM      Result Value Ref Range Status   Specimen Description URINE, CLEAN CATCH   Final   Special Requests NONE   Final   Culture  Setup Time     Final   Value: 04/02/2014 14:42     Performed at [place]     Final   Value: >=100,000 COLONIES/ML     Performed at [facility]   Culture     Final   Value: KLEBSIELLA PNEUMONIAE     Performed at [facility]   Report Status 04/04/2014 FINAL   Final   Organism ID, Bacteria KLEBSIELLA PNEUMONIAE   Final  MRSA PCR SCREENING     Status: None   Collection Time    04/02/14  6:30 AM      Result Value Ref Range Status   MRSA by PCR NEGATIVE  NEGATIVE Final   Comment:            The GeneXpert MRSA Assay (FDA     approved for NASAL specimens     only), is one component of a     comprehensive MRSA colonization     surveillance program. It is not     intended to diagnose MRSA     infection nor to guide or     monitor treatment for     MRSA infections.     Labs: Results for orders placed during the hospital encounter of 04/01/14 (from the past 48 hour(s))  GLUCOSE, CAPILLARY     Status: Abnormal   Collection Time    04/03/14 11:48 AM  Result Value Ref Range   Glucose-Capillary 170 (*) 70 - 99 mg/dL   Comment 1 Documented in Chart     Comment 2 Notify RN    TSH     Status: None   Collection Time    04/03/14  3:10 PM      Result Value Ref Range   TSH 0.557  0.350 - 4.500 uIU/mL  VITAMIN B12     Status:  Abnormal   Collection Time    04/03/14  3:10 PM      Result Value Ref Range   Vitamin B-12 1256 (*) 211 - 911 pg/mL   Comment: Performed at [place]     Status: None   Collection Time    04/03/14  3:10 PM      Result Value Ref Range   Ammonia 29  11 - 60 umol/L  CBC     Status: Abnormal   Collection Time    04/03/14  3:10 PM      Result Value Ref Range   WBC 14.1 (*) 4.0 - 10.5 K/uL   RBC 5.00  3.87 - 5.11 MIL/uL   Hemoglobin 16.4 (*) 12.0 - 15.0 g/dL   HCT 48.7 (*) 36.0 - 46.0 %   MCV 97.4  78.0 - 100.0 fL   MCH 32.8  26.0 - 34.0 pg   MCHC 33.7  30.0 - 36.0 g/dL   RDW 12.6  11.5 - 15.5 %   Platelets 248  150 - 400 K/uL  COMPREHENSIVE METABOLIC PANEL     Status: Abnormal   Collection Time    04/03/14  3:10 PM      Result Value Ref Range   Sodium 140  137 - 147 mEq/L   Potassium 3.8  3.7 - 5.3 mEq/L   Chloride 95 (*) 96 - 112 mEq/L   CO2 29  19 - 32 mEq/L   Glucose, Bld 142 (*) 70 - 99 mg/dL   BUN 6  6 - 23 mg/dL   Creatinine, Ser 0.61  0.50 - 1.10 mg/dL   Calcium 9.9  8.4 - 10.5 mg/dL   Total Protein 7.9  6.0 - 8.3 g/dL   Albumin 3.6  3.5 - 5.2 g/dL   AST 28  0 - 37 U/L   Comment: HEMOLYSIS AT THIS LEVEL MAY AFFECT RESULT   ALT 13  0 - 35 U/L   Alkaline Phosphatase 127 (*) 39 - 117 U/L   Total Bilirubin 0.7  0.3 - 1.2 mg/dL   GFR calc non Af Amer >90  >90 mL/min   GFR calc Af Amer >90  >90 mL/min   Comment: (NOTE)     The eGFR has been calculated using the CKD EPI equation.     This calculation has not been validated in all clinical situations.     eGFR's persistently <90 mL/min signify possible Chronic Kidney     Disease.  GLUCOSE, CAPILLARY     Status: Abnormal   Collection Time    04/03/14  4:43 PM      Result Value Ref Range   Glucose-Capillary 153 (*) 70 - 99 mg/dL   2-D echo  cc:  ------------------------------------------------------------------- LV EF: 60% -  65%  ------------------------------------------------------------------- Study Conclusions  - Left ventricle: The cavity size was normal. Systolic function was normal. The estimated ejection fraction was in the range of 60% to 65%. Wall motion was normal; there were no regional wall motion abnormalities.    HPI :  48 y.o. female with a history of COPD, Chronic Pain and Neuropathy, and Questionable MS who was brought to the ED emergently after being found by her husband around 9 pm unresponsive on the floor. She was found to be hypoxic by EMS with O2 saturations of 40 % and was placed on supplemental O2 with improvement. In the ED when she arrived she was given Narcan IV X1 dose and became alert. On Interview, she adamantly denied any suicidal ideation or intent. 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 There was no intracranial hemorrhage seen on the CT scan. She was referred for medical admission. Per her medical records, MultipleSclerosis is listed as on e of her diagnosis, however, she and her husband are not clear on her actual diagnosis.  HOSPITAL COURSE: 1. Altered mental status secondary to hypoxia?UTI, abnormal CT scan showing bilateral globus pallidus hypo densities which appeared chronic on the MRI , no intracranial hemorrhage, history of multiple sclerosis.stable MRI of the brain was negative for acute findings . Suspected drug overdose and overuse of narcotics although urine drug screen only positive for benzodiazepines and negative for narcotics. Carboxyhemoglobin test was canceled. Discussed with Dr. Nicole Kindred neurology who recommended MRI that r/o acute CVA , also requested psychiatric consult that r/o suicidal/homicidal ideation . normal TSH, B12, B1. The patient also has a urinary tract infection and this could be related to acute toxic metabolic encephalopathy. Made progress , mentation  improved , stable for DC    2. With his history of chronic low back pain and fibromyalgia and restarted Percocet,Klonopin and nicotine patch in case the patient is in withdrawal.After restarting these meds patient showed significant improvement , advised patient to strictly  avoid overuse and take medications as prescribed   3. Hypoxia resolved , likely secondary to drug overdose, weaned down to 99% on room. chest x-ray shows atelectasisvs pneumonia, no prior history of DVT or PE, there was a low clinical suspicion for this upon admission. Hypoxia resolved after receiving Narcan. She does have a history of COPD. Continue when necessary Ventolin. Advised smoking cessation   4. Unintentional overdose, ruled out for suicidal/homicidal  Ideation by  Psychiatry   5. Klebsiella UTI continue ciprofloxacin, continue oral antibiotics for another 2 days 6. Hyperglycemia, A1c 5.7 continue sliding scale insulin, repeat hemoglobin A1c in 3 months   7.  Diet initially n.p.o. because of suspected high risk of aspiration, patient had a speech therapy consult , patient underwent modified barium swallow , final recommendation was regular diet with thin liquids  Code Status: full      Discharge Exam: *  Blood pressure 136/82, pulse 88, temperature 98.2 F (36.8 C), temperature source Oral, resp. rate 14, height 4' 9"  (1.448 m), weight 71.668 kg (158 lb), SpO2 99.00%.  HEART: Regular rate and rhythm; no murmurs rubs or gallops  BACK: No kyphosis or scoliosis; no CVA tenderness  ABDOMEN: Positive Bowel Sounds, Obese, soft non-tender; no masses, no organomegaly, no pannus; no intertriginous candida.  Rectal Exam: Not done  EXTREMITIES: No cyanosis, clubbing or edema; no ulcerations.  Genitalia: not examined  PULSES: 2+ and symmetric  SKIN: Normal hydration no rash or ulceration  CNS confused, oriented x 3, Speech Clear, CNII- XII intact, Sensory and Motor Function Intact, Cerebellar Fxn Intact, DTRs  2/4, Gait Deferred  Vascular: pulses palpable throughout         Discharge Instructions  Diet - low sodium heart healthy    Complete by:  As directed      Increase activity slowly    Complete by:  As directed            Follow-up Information   Follow up with [name],[name], MD In 1 week.   Specialty:  Family Medicine   Contact information:   [address] [place] [address] [phone]       Signed: Reyne Dumas 04/05/2014, 9:00 AM

## 2014-04-05 NOTE — Care Management Note (Signed)
    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   Status of service:  Completed, signed off Medicare Important Message given?   (If response is "NO", the following Medicare IM given date fields will be blank) Date Medicare IM given:   Date Additional Medicare IM given:    Discharge Disposition:  [place]  Per UR Regulation:    If discussed at Long Length of Stay Meetings, dates discussed:    Comments:  04/05/14 11:15 CM spoke with pt to offer choice of home health agency.  Pt chooses [facility] to render HHPT/OT.  Address and contact information verified with pt.  Referral texted to [facility] rep, Kristen.  No other CM needs were communicated. [name], BSN, CM [phone].

## 2014-04-06 DIAGNOSIS — M6281 Muscle weakness (generalized): Secondary | ICD-10-CM | POA: Diagnosis not present

## 2014-04-06 DIAGNOSIS — R269 Unspecified abnormalities of gait and mobility: Secondary | ICD-10-CM | POA: Diagnosis not present

## 2014-04-06 DIAGNOSIS — Z5189 Encounter for other specified aftercare: Secondary | ICD-10-CM | POA: Diagnosis not present

## 2014-04-06 DIAGNOSIS — G894 Chronic pain syndrome: Secondary | ICD-10-CM | POA: Diagnosis not present

## 2014-04-06 DIAGNOSIS — N390 Urinary tract infection, site not specified: Secondary | ICD-10-CM | POA: Diagnosis not present

## 2014-04-06 DIAGNOSIS — J449 Chronic obstructive pulmonary disease, unspecified: Secondary | ICD-10-CM | POA: Diagnosis not present

## 2014-04-08 DIAGNOSIS — M6281 Muscle weakness (generalized): Secondary | ICD-10-CM | POA: Diagnosis not present

## 2014-04-08 DIAGNOSIS — G894 Chronic pain syndrome: Secondary | ICD-10-CM | POA: Diagnosis not present

## 2014-04-08 DIAGNOSIS — N390 Urinary tract infection, site not specified: Secondary | ICD-10-CM | POA: Diagnosis not present

## 2014-04-08 DIAGNOSIS — Z5189 Encounter for other specified aftercare: Secondary | ICD-10-CM | POA: Diagnosis not present

## 2014-04-08 DIAGNOSIS — R269 Unspecified abnormalities of gait and mobility: Secondary | ICD-10-CM | POA: Diagnosis not present

## 2014-04-08 DIAGNOSIS — J449 Chronic obstructive pulmonary disease, unspecified: Secondary | ICD-10-CM | POA: Diagnosis not present

## 2014-04-08 LAB — VITAMIN B1

## 2014-04-09 ENCOUNTER — Other Ambulatory Visit: Payer: Self-pay | Admitting: *Deleted

## 2014-04-09 DIAGNOSIS — M6281 Muscle weakness (generalized): Secondary | ICD-10-CM | POA: Diagnosis not present

## 2014-04-09 DIAGNOSIS — E519 Thiamine deficiency, unspecified: Secondary | ICD-10-CM

## 2014-04-09 DIAGNOSIS — Z5189 Encounter for other specified aftercare: Secondary | ICD-10-CM | POA: Diagnosis not present

## 2014-04-09 DIAGNOSIS — R269 Unspecified abnormalities of gait and mobility: Secondary | ICD-10-CM | POA: Diagnosis not present

## 2014-04-09 DIAGNOSIS — J449 Chronic obstructive pulmonary disease, unspecified: Secondary | ICD-10-CM | POA: Diagnosis not present

## 2014-04-09 DIAGNOSIS — G894 Chronic pain syndrome: Secondary | ICD-10-CM | POA: Diagnosis not present

## 2014-04-09 DIAGNOSIS — N390 Urinary tract infection, site not specified: Secondary | ICD-10-CM | POA: Diagnosis not present

## 2014-04-09 NOTE — Progress Notes (Signed)
Patient notified. BW order is in. Pt will pick up OTC med and make appt.

## 2014-04-13 DIAGNOSIS — J449 Chronic obstructive pulmonary disease, unspecified: Secondary | ICD-10-CM | POA: Diagnosis not present

## 2014-04-13 DIAGNOSIS — N390 Urinary tract infection, site not specified: Secondary | ICD-10-CM | POA: Diagnosis not present

## 2014-04-13 DIAGNOSIS — M6281 Muscle weakness (generalized): Secondary | ICD-10-CM | POA: Diagnosis not present

## 2014-04-13 DIAGNOSIS — Z5189 Encounter for other specified aftercare: Secondary | ICD-10-CM | POA: Diagnosis not present

## 2014-04-13 DIAGNOSIS — R269 Unspecified abnormalities of gait and mobility: Secondary | ICD-10-CM | POA: Diagnosis not present

## 2014-04-13 DIAGNOSIS — G894 Chronic pain syndrome: Secondary | ICD-10-CM | POA: Diagnosis not present

## 2014-04-15 ENCOUNTER — Encounter: Payer: Self-pay | Admitting: Family Medicine

## 2014-04-15 ENCOUNTER — Ambulatory Visit (INDEPENDENT_AMBULATORY_CARE_PROVIDER_SITE_OTHER): Payer: BC Managed Care – PPO | Admitting: Family Medicine

## 2014-04-15 VITALS — BP 122/80 | Ht <= 58 in | Wt 159.0 lb

## 2014-04-15 DIAGNOSIS — J449 Chronic obstructive pulmonary disease, unspecified: Secondary | ICD-10-CM | POA: Diagnosis not present

## 2014-04-15 DIAGNOSIS — G894 Chronic pain syndrome: Secondary | ICD-10-CM

## 2014-04-15 DIAGNOSIS — Z5189 Encounter for other specified aftercare: Secondary | ICD-10-CM | POA: Diagnosis not present

## 2014-04-15 DIAGNOSIS — N3000 Acute cystitis without hematuria: Secondary | ICD-10-CM | POA: Diagnosis not present

## 2014-04-15 DIAGNOSIS — G709 Myoneural disorder, unspecified: Secondary | ICD-10-CM | POA: Insufficient documentation

## 2014-04-15 DIAGNOSIS — N949 Unspecified condition associated with female genital organs and menstrual cycle: Secondary | ICD-10-CM

## 2014-04-15 DIAGNOSIS — R269 Unspecified abnormalities of gait and mobility: Secondary | ICD-10-CM | POA: Diagnosis not present

## 2014-04-15 DIAGNOSIS — M6281 Muscle weakness (generalized): Secondary | ICD-10-CM | POA: Diagnosis not present

## 2014-04-15 DIAGNOSIS — I6350 Cerebral infarction due to unspecified occlusion or stenosis of unspecified cerebral artery: Secondary | ICD-10-CM

## 2014-04-15 DIAGNOSIS — R29898 Other symptoms and signs involving the musculoskeletal system: Secondary | ICD-10-CM

## 2014-04-15 DIAGNOSIS — G609 Hereditary and idiopathic neuropathy, unspecified: Secondary | ICD-10-CM

## 2014-04-15 DIAGNOSIS — R102 Pelvic and perineal pain: Secondary | ICD-10-CM

## 2014-04-15 DIAGNOSIS — N390 Urinary tract infection, site not specified: Secondary | ICD-10-CM | POA: Diagnosis not present

## 2014-04-15 LAB — POCT URINALYSIS DIPSTICK
Spec Grav, UA: <=1.005
pH, UA: 6

## 2014-04-15 MED ORDER — FLUCONAZOLE 150 MG PO TABS: 150.0000 mg | ORAL_TABLET | Freq: Once | ORAL | Status: DC

## 2014-04-15 NOTE — Progress Notes (Signed)
Subjective:    Patient ID: [name], female    DOB: 1966/01/06, 48 y.o.   MRN: [id number]  HPI Patient is here today for hospitalization follow up visit. Patient was treated at [facility] ER and transferred to [facility] on 04/04/14 due to hypoxia and overdose. Patient claims compliancy with medications. She denies overdosing.   Patient states she has no other concerns at this time.   This patient was seen today for chronic pain  The medication list was reviewed and updated.   -Compliance with pain medication: She relates good compliance with her medicine curiously when she was in the hospital urine drug test was negative for narcotics we will be repeating a urine drug test this fall  The patient was advised the importance of maintaining medication and not using illegal substances with these.  Refills needed: She relates needing refills on her medicines. She denies abusing it states it does not cause drowsiness.  The patient was educated that we can provide 3 monthly scripts for their medication, it is their responsibility to follow the instructions.  Side effects or complications from medications: See above. Patient denies drowsiness or complications from her medicine. See discussion above and below.  Patient is aware that pain medications are meant to minimize the severity of the pain to allow their pain levels to improve to allow for better function. They are aware of that pain medications cannot totally remove their pain.  Due for UDT ( at least once per year) : This fall.        Review of Systems  Constitutional: Negative for fever, activity change, appetite change and fatigue.  HENT: Negative for congestion.   Respiratory: Negative for cough and shortness of breath.   Cardiovascular: Negative for chest pain and leg swelling.  Gastrointestinal: Negative for abdominal pain.  Endocrine: Negative for polydipsia and polyphagia.  Genitourinary: Positive for frequency.  Negative for hematuria and menstrual problem.  Neurological: Negative for dizziness, weakness and headaches.  Psychiatric/Behavioral: Negative for confusion.       Objective:   Physical Exam  Vitals reviewed. Constitutional: She appears well-nourished. No distress.  Cardiovascular: Normal rate, regular rhythm and normal heart sounds.   No murmur heard. Pulmonary/Chest: Effort normal and breath sounds normal. No respiratory distress.  Musculoskeletal: She exhibits no edema.  Lymphadenopathy:    She has no cervical adenopathy.  Neurological: She is alert. She exhibits normal muscle tone.  Psychiatric: Her behavior is normal.          Assessment & Plan:  1. Vaginal pain Her problems are more consistent with yeast infection Diflucan ordered. Her urinalysis is negative. - POCT urinalysis dipstick  2. Acute cystitis without hematuria UTI is resolved.  3. Weakness of both legs She still has weakness in her legs she is able do some walking but she maintains been disabled.  4. Chronic pain syndrome She has chronic pain in her lower back as well as neuropathy in her feet. She takes Neurontin 3 times daily she denies it causing drowsiness she also relates that her pain medication helps her with her pain she states that when she tried lower dose she had significant pain and discomfort she denies causing drowsiness with this. Recently she was in the hospital with a possible drug overdose. We discuss how she had 2 small strokes we also discussed the importance of maintaining a healthy use of her medications and not abusing it. She denies abusing it. Also her urine drug screen when she  was in the hospital was negative for narcotics. We will be repeating a urine drug screen this fall. In addition to this patient was warned that if she is having any drowsiness with her medicines she needs to hold off on taking her pain medication. Also I recommended to reduce her Neurontin to twice daily when  possible plus also reducing nerve medication to just once daily. Patient agrees to trying this. We will follow her up in the near future.  5. Unspecified hereditary and idiopathic peripheral neuropathy Neurontin and see discussion above  Patient to followup within 3 months

## 2014-04-15 NOTE — Patient Instructions (Signed)
Avilyn:  Neurontin - try to reduce to one twice a day rather than 3 per day  Also try to reduce Klonopin to one per day when possible  May maintain other meds as is

## 2014-04-21 DIAGNOSIS — G894 Chronic pain syndrome: Secondary | ICD-10-CM | POA: Diagnosis not present

## 2014-04-21 DIAGNOSIS — M6281 Muscle weakness (generalized): Secondary | ICD-10-CM | POA: Diagnosis not present

## 2014-04-21 DIAGNOSIS — N390 Urinary tract infection, site not specified: Secondary | ICD-10-CM | POA: Diagnosis not present

## 2014-04-21 DIAGNOSIS — Z5189 Encounter for other specified aftercare: Secondary | ICD-10-CM | POA: Diagnosis not present

## 2014-04-21 DIAGNOSIS — R269 Unspecified abnormalities of gait and mobility: Secondary | ICD-10-CM | POA: Diagnosis not present

## 2014-04-21 DIAGNOSIS — J449 Chronic obstructive pulmonary disease, unspecified: Secondary | ICD-10-CM | POA: Diagnosis not present

## 2014-04-22 DIAGNOSIS — R269 Unspecified abnormalities of gait and mobility: Secondary | ICD-10-CM | POA: Diagnosis not present

## 2014-04-22 DIAGNOSIS — M6281 Muscle weakness (generalized): Secondary | ICD-10-CM | POA: Diagnosis not present

## 2014-04-22 DIAGNOSIS — G894 Chronic pain syndrome: Secondary | ICD-10-CM | POA: Diagnosis not present

## 2014-05-06 DIAGNOSIS — Z5189 Encounter for other specified aftercare: Secondary | ICD-10-CM | POA: Diagnosis not present

## 2014-05-06 DIAGNOSIS — G894 Chronic pain syndrome: Secondary | ICD-10-CM | POA: Diagnosis not present

## 2014-05-06 DIAGNOSIS — M6281 Muscle weakness (generalized): Secondary | ICD-10-CM | POA: Diagnosis not present

## 2014-05-06 DIAGNOSIS — R269 Unspecified abnormalities of gait and mobility: Secondary | ICD-10-CM | POA: Diagnosis not present

## 2014-06-17 ENCOUNTER — Encounter: Payer: Self-pay | Admitting: Family Medicine

## 2014-06-17 ENCOUNTER — Ambulatory Visit (INDEPENDENT_AMBULATORY_CARE_PROVIDER_SITE_OTHER): Payer: BC Managed Care – PPO | Admitting: Family Medicine

## 2014-06-17 VITALS — BP 120/78 | Ht <= 58 in | Wt 157.0 lb

## 2014-06-17 DIAGNOSIS — G609 Hereditary and idiopathic neuropathy, unspecified: Secondary | ICD-10-CM

## 2014-06-17 DIAGNOSIS — I6350 Cerebral infarction due to unspecified occlusion or stenosis of unspecified cerebral artery: Secondary | ICD-10-CM

## 2014-06-17 MED ORDER — GABAPENTIN 300 MG PO CAPS: ORAL_CAPSULE | ORAL | Status: DC

## 2014-06-17 MED ORDER — OXYCODONE-ACETAMINOPHEN 10-325 MG PO TABS: 1.0000 | ORAL_TABLET | ORAL | Status: DC | PRN

## 2014-06-17 NOTE — Progress Notes (Signed)
   Subjective:    Patient ID: [name], female    DOB: Dec 25, 1965, 48 y.o.   MRN: [id number]  HPI Patient is here for a follow up visit concerning her neuropathy. Patient states she has no new concerns.  She relates her neuropathy seems to be getting worse causing more burning pain discomfort no swelling denies calf pain denies chest pain or shortness of breath she denies medications causing drowsiness or altered mental status Review of Systems  Constitutional: Negative for activity change, appetite change and fatigue.  Gastrointestinal: Negative for abdominal pain.  Neurological: Negative for headaches.  Psychiatric/Behavioral: Negative for behavioral problems.       Objective:   Physical Exam  Vitals reviewed. Constitutional: She appears well-nourished. No distress.  HENT:  Head: Normocephalic.  Cardiovascular: Normal rate, regular rhythm and normal heart sounds.   No murmur heard. Pulmonary/Chest: Effort normal and breath sounds normal.  Musculoskeletal: She exhibits no edema.  Lymphadenopathy:    She has no cervical adenopathy.  Neurological: She is alert.  Psychiatric: Her behavior is normal.          Assessment & Plan:  Peripheral neuropathy that is getting worse we will increase the Neurontin. Also the pain medicine may be used up to 5 times a day she will followup within 2 months I did encourage her to call her neurologist at [facility] in [facility] in quite possibly get a followup appointment in the near future regarding this issue  This patient is permanently disabled

## 2014-07-01 ENCOUNTER — Other Ambulatory Visit: Payer: Self-pay | Admitting: Family Medicine

## 2014-07-17 ENCOUNTER — Ambulatory Visit: Payer: BC Managed Care – PPO | Admitting: Family Medicine

## 2014-07-23 ENCOUNTER — Encounter: Payer: Self-pay | Admitting: Family Medicine

## 2014-07-23 ENCOUNTER — Ambulatory Visit (INDEPENDENT_AMBULATORY_CARE_PROVIDER_SITE_OTHER): Payer: BC Managed Care – PPO | Admitting: Family Medicine

## 2014-07-23 VITALS — BP 104/80 | Ht <= 58 in | Wt 155.5 lb

## 2014-07-23 DIAGNOSIS — Z23 Encounter for immunization: Secondary | ICD-10-CM

## 2014-07-23 DIAGNOSIS — R7309 Other abnormal glucose: Secondary | ICD-10-CM | POA: Diagnosis not present

## 2014-07-23 DIAGNOSIS — R7303 Prediabetes: Secondary | ICD-10-CM | POA: Insufficient documentation

## 2014-07-23 DIAGNOSIS — R7401 Elevation of levels of liver transaminase levels: Secondary | ICD-10-CM

## 2014-07-23 DIAGNOSIS — R740 Nonspecific elevation of levels of transaminase and lactic acid dehydrogenase [LDH]: Secondary | ICD-10-CM

## 2014-07-23 DIAGNOSIS — D72829 Elevated white blood cell count, unspecified: Secondary | ICD-10-CM

## 2014-07-23 DIAGNOSIS — R739 Hyperglycemia, unspecified: Secondary | ICD-10-CM | POA: Diagnosis not present

## 2014-07-23 DIAGNOSIS — I639 Cerebral infarction, unspecified: Secondary | ICD-10-CM

## 2014-07-23 DIAGNOSIS — D510 Vitamin B12 deficiency anemia due to intrinsic factor deficiency: Secondary | ICD-10-CM | POA: Diagnosis not present

## 2014-07-23 DIAGNOSIS — Z79899 Other long term (current) drug therapy: Secondary | ICD-10-CM

## 2014-07-23 DIAGNOSIS — G894 Chronic pain syndrome: Secondary | ICD-10-CM

## 2014-07-23 MED ORDER — OXYCODONE-ACETAMINOPHEN 10-325 MG PO TABS: 1.0000 | ORAL_TABLET | ORAL | Status: DC | PRN

## 2014-07-23 NOTE — Patient Instructions (Signed)
As part of your visit today we have covered your chronic pain. You have been given prescription(s) for pain medicines.The DEA and State Medical Board require that any patient on pain medications must be seen every 3 months. You are expected to come in for a office visit before further pain medications are issued.   We will not refill medications or early nor will we give an extended month supply at the end of these prescriptions.It is your responsibility to keep up with medications. They will not be replaced.  It is your responsibility to schedule an office visit in 3-4 months to be seen before you are out of your medication. Do not call our office to request early refills or additional refills. Do not wait till the last moment to schedule the follow up visit. We highly recommend you schedule this now for 3 months.  We believe that most patients take their meds as prescribed but drug misuse and diversion is a serious problem in the [place]. Our office does standard measures to insure proper care to all. All patients are subject to random urine drug screens and random pill counts. Also all patients drug prescription records are reviewed on a regular basis in accordance with State medical board policies.  Remember, do not use alcohol or illegal drugs with your pain medications.    We are required by law to adhere to strict regulations. Failure on our part to follow these regulations could jeopardize our prescription license which in turn would cause us not to be able to care for you.Thank you for your understanding and following these policies.  

## 2014-07-23 NOTE — Progress Notes (Signed)
   Subjective:    Patient ID: [name], female    DOB: [birth year], 48 y.o.   MRN: [id number]  HPI This patient was seen today for chronic pain  The medication list was reviewed and updated.   -Compliance with pain medication: yes     The patient was advised the importance of maintaining medication and not using illegal substances with these.   Refills needed: yes  The patient was educated that we can provide 3 monthly scripts for their medication, it is their responsibility to follow the instructions.  Side effects or complications from medications: none  Patient is aware that pain medications are meant to minimize the severity of the pain to allow their pain levels to improve to allow for better function. They are aware of that pain medications cannot totally remove their pain.  Due for UDT ( at least once per year) :   Patient has no other concerns at this time.   Patient states that she's not having used the albuterol much   She states her moods are doing well on citalopram uses Klonopin as necessary she denies drowsiness with that she states the Neurontin is helping with her peripheral neuropathy. She is planning to go in and see the neurologist later this year. She is due for lab work. To look at cholesterol kidney functions potassium liver functions and A1c along with CBC  Review of Systems  Constitutional: Negative for activity change, appetite change and fatigue.  Endocrine: Negative for polydipsia and polyphagia.  Genitourinary: Negative for frequency.  Neurological: Negative for weakness.  Psychiatric/Behavioral: Negative for confusion.       Objective:   Physical Exam  Vitals reviewed. Constitutional: She appears well-nourished. No distress.  Cardiovascular: Normal rate, regular rhythm and normal heart sounds.   No murmur heard. Pulmonary/Chest: Effort normal and breath sounds normal. No respiratory distress.  Musculoskeletal: She exhibits no edema.    Lymphadenopathy:    She has no cervical adenopathy.  Neurological: She is alert. She exhibits normal muscle tone.  Psychiatric: Her behavior is normal.          Assessment & Plan:  #1 chronic pain in her back and legs pain medication prescriptions given. Followup 3 months. The registry was checked. She is getting them filled reliably without other prescribers.  #2 history of pernicious anemia and leukocytosis check CBC  #3 prediabetes watch starches stay physical reactive  #4 history of elevated liver enzymes Will check lipid and liver profile.

## 2014-09-08 HISTORY — PX: TRACHEOSTOMY: SUR1362

## 2014-09-14 ENCOUNTER — Other Ambulatory Visit: Payer: Self-pay | Admitting: Family Medicine

## 2014-09-26 ENCOUNTER — Encounter (HOSPITAL_COMMUNITY): Payer: Self-pay | Admitting: Emergency Medicine

## 2014-09-26 ENCOUNTER — Emergency Department (HOSPITAL_COMMUNITY): Payer: BLUE CROSS/BLUE SHIELD

## 2014-09-26 ENCOUNTER — Inpatient Hospital Stay (HOSPITAL_COMMUNITY)
Admission: EM | Admit: 2014-09-26 | Discharge: 2014-10-18 | DRG: 003 | Disposition: A | Discharge status: Other Acute Inpatient Hospital | Payer: BLUE CROSS/BLUE SHIELD | Attending: Pulmonary Disease | Admitting: Pulmonary Disease

## 2014-09-26 DIAGNOSIS — I10 Essential (primary) hypertension: Secondary | ICD-10-CM | POA: Diagnosis present

## 2014-09-26 DIAGNOSIS — K255 Chronic or unspecified gastric ulcer with perforation: Secondary | ICD-10-CM

## 2014-09-26 DIAGNOSIS — D72829 Elevated white blood cell count, unspecified: Secondary | ICD-10-CM | POA: Diagnosis not present

## 2014-09-26 DIAGNOSIS — J189 Pneumonia, unspecified organism: Secondary | ICD-10-CM | POA: Diagnosis not present

## 2014-09-26 DIAGNOSIS — I998 Other disorder of circulatory system: Secondary | ICD-10-CM | POA: Insufficient documentation

## 2014-09-26 DIAGNOSIS — E2740 Unspecified adrenocortical insufficiency: Secondary | ICD-10-CM | POA: Diagnosis not present

## 2014-09-26 DIAGNOSIS — J449 Chronic obstructive pulmonary disease, unspecified: Secondary | ICD-10-CM | POA: Diagnosis present

## 2014-09-26 DIAGNOSIS — I742 Embolism and thrombosis of arteries of the upper extremities: Secondary | ICD-10-CM | POA: Diagnosis present

## 2014-09-26 DIAGNOSIS — I248 Other forms of acute ischemic heart disease: Secondary | ICD-10-CM | POA: Diagnosis not present

## 2014-09-26 DIAGNOSIS — R0602 Shortness of breath: Secondary | ICD-10-CM | POA: Diagnosis not present

## 2014-09-26 DIAGNOSIS — R918 Other nonspecific abnormal finding of lung field: Secondary | ICD-10-CM | POA: Diagnosis not present

## 2014-09-26 DIAGNOSIS — Z452 Encounter for adjustment and management of vascular access device: Secondary | ICD-10-CM | POA: Diagnosis not present

## 2014-09-26 DIAGNOSIS — E440 Moderate protein-calorie malnutrition: Secondary | ICD-10-CM | POA: Diagnosis present

## 2014-09-26 DIAGNOSIS — B49 Unspecified mycosis: Secondary | ICD-10-CM | POA: Diagnosis not present

## 2014-09-26 DIAGNOSIS — E162 Hypoglycemia, unspecified: Secondary | ICD-10-CM | POA: Diagnosis not present

## 2014-09-26 DIAGNOSIS — I809 Phlebitis and thrombophlebitis of unspecified site: Secondary | ICD-10-CM | POA: Diagnosis not present

## 2014-09-26 DIAGNOSIS — R6520 Severe sepsis without septic shock: Secondary | ICD-10-CM | POA: Diagnosis not present

## 2014-09-26 DIAGNOSIS — J984 Other disorders of lung: Secondary | ICD-10-CM | POA: Diagnosis not present

## 2014-09-26 DIAGNOSIS — R6521 Severe sepsis with septic shock: Secondary | ICD-10-CM | POA: Diagnosis not present

## 2014-09-26 DIAGNOSIS — I6789 Other cerebrovascular disease: Secondary | ICD-10-CM | POA: Diagnosis not present

## 2014-09-26 DIAGNOSIS — G894 Chronic pain syndrome: Secondary | ICD-10-CM | POA: Diagnosis present

## 2014-09-26 DIAGNOSIS — R0989 Other specified symptoms and signs involving the circulatory and respiratory systems: Secondary | ICD-10-CM | POA: Diagnosis not present

## 2014-09-26 DIAGNOSIS — D519 Vitamin B12 deficiency anemia, unspecified: Secondary | ICD-10-CM | POA: Diagnosis present

## 2014-09-26 DIAGNOSIS — J9601 Acute respiratory failure with hypoxia: Secondary | ICD-10-CM | POA: Diagnosis present

## 2014-09-26 DIAGNOSIS — R109 Unspecified abdominal pain: Secondary | ICD-10-CM | POA: Diagnosis not present

## 2014-09-26 DIAGNOSIS — J9690 Respiratory failure, unspecified, unspecified whether with hypoxia or hypercapnia: Secondary | ICD-10-CM

## 2014-09-26 DIAGNOSIS — Z9911 Dependence on respirator [ventilator] status: Secondary | ICD-10-CM | POA: Diagnosis not present

## 2014-09-26 DIAGNOSIS — E876 Hypokalemia: Secondary | ICD-10-CM | POA: Diagnosis not present

## 2014-09-26 DIAGNOSIS — R7889 Finding of other specified substances, not normally found in blood: Secondary | ICD-10-CM | POA: Diagnosis not present

## 2014-09-26 DIAGNOSIS — J15212 Pneumonia due to Methicillin resistant Staphylococcus aureus: Secondary | ICD-10-CM | POA: Diagnosis not present

## 2014-09-26 DIAGNOSIS — J9691 Respiratory failure, unspecified with hypoxia: Secondary | ICD-10-CM | POA: Diagnosis not present

## 2014-09-26 DIAGNOSIS — K256 Chronic or unspecified gastric ulcer with both hemorrhage and perforation: Secondary | ICD-10-CM | POA: Diagnosis not present

## 2014-09-26 DIAGNOSIS — R110 Nausea: Secondary | ICD-10-CM | POA: Diagnosis not present

## 2014-09-26 DIAGNOSIS — J690 Pneumonitis due to inhalation of food and vomit: Secondary | ICD-10-CM | POA: Diagnosis not present

## 2014-09-26 DIAGNOSIS — I2489 Other forms of acute ischemic heart disease: Secondary | ICD-10-CM

## 2014-09-26 DIAGNOSIS — J9811 Atelectasis: Secondary | ICD-10-CM | POA: Diagnosis not present

## 2014-09-26 DIAGNOSIS — R5381 Other malaise: Secondary | ICD-10-CM | POA: Diagnosis not present

## 2014-09-26 DIAGNOSIS — I748 Embolism and thrombosis of other arteries: Secondary | ICD-10-CM | POA: Diagnosis not present

## 2014-09-26 DIAGNOSIS — N179 Acute kidney failure, unspecified: Secondary | ICD-10-CM | POA: Diagnosis present

## 2014-09-26 DIAGNOSIS — G9340 Encephalopathy, unspecified: Secondary | ICD-10-CM | POA: Diagnosis not present

## 2014-09-26 DIAGNOSIS — IMO0002 Reserved for concepts with insufficient information to code with codable children: Secondary | ICD-10-CM | POA: Insufficient documentation

## 2014-09-26 DIAGNOSIS — K760 Fatty (change of) liver, not elsewhere classified: Secondary | ICD-10-CM | POA: Diagnosis not present

## 2014-09-26 DIAGNOSIS — G253 Myoclonus: Secondary | ICD-10-CM | POA: Diagnosis not present

## 2014-09-26 DIAGNOSIS — I728 Aneurysm of other specified arteries: Secondary | ICD-10-CM

## 2014-09-26 DIAGNOSIS — R739 Hyperglycemia, unspecified: Secondary | ICD-10-CM | POA: Diagnosis not present

## 2014-09-26 DIAGNOSIS — B379 Candidiasis, unspecified: Secondary | ICD-10-CM

## 2014-09-26 DIAGNOSIS — I469 Cardiac arrest, cause unspecified: Secondary | ICD-10-CM | POA: Diagnosis not present

## 2014-09-26 DIAGNOSIS — M7989 Other specified soft tissue disorders: Secondary | ICD-10-CM | POA: Diagnosis not present

## 2014-09-26 DIAGNOSIS — I70208 Unspecified atherosclerosis of native arteries of extremities, other extremity: Secondary | ICD-10-CM

## 2014-09-26 DIAGNOSIS — F419 Anxiety disorder, unspecified: Secondary | ICD-10-CM | POA: Diagnosis present

## 2014-09-26 DIAGNOSIS — R0603 Acute respiratory distress: Secondary | ICD-10-CM

## 2014-09-26 DIAGNOSIS — R4182 Altered mental status, unspecified: Secondary | ICD-10-CM | POA: Diagnosis not present

## 2014-09-26 DIAGNOSIS — J90 Pleural effusion, not elsewhere classified: Secondary | ICD-10-CM | POA: Diagnosis not present

## 2014-09-26 DIAGNOSIS — K668 Other specified disorders of peritoneum: Secondary | ICD-10-CM

## 2014-09-26 DIAGNOSIS — D649 Anemia, unspecified: Secondary | ICD-10-CM | POA: Diagnosis not present

## 2014-09-26 DIAGNOSIS — Z8673 Personal history of transient ischemic attack (TIA), and cerebral infarction without residual deficits: Secondary | ICD-10-CM | POA: Diagnosis not present

## 2014-09-26 DIAGNOSIS — K251 Acute gastric ulcer with perforation: Secondary | ICD-10-CM | POA: Diagnosis not present

## 2014-09-26 DIAGNOSIS — R001 Bradycardia, unspecified: Secondary | ICD-10-CM | POA: Diagnosis not present

## 2014-09-26 DIAGNOSIS — E869 Volume depletion, unspecified: Secondary | ICD-10-CM | POA: Diagnosis present

## 2014-09-26 DIAGNOSIS — K2970 Gastritis, unspecified, without bleeding: Secondary | ICD-10-CM | POA: Diagnosis not present

## 2014-09-26 DIAGNOSIS — M21379 Foot drop, unspecified foot: Secondary | ICD-10-CM | POA: Diagnosis not present

## 2014-09-26 DIAGNOSIS — B377 Candidal sepsis: Secondary | ICD-10-CM | POA: Diagnosis not present

## 2014-09-26 DIAGNOSIS — F329 Major depressive disorder, single episode, unspecified: Secondary | ICD-10-CM | POA: Diagnosis present

## 2014-09-26 DIAGNOSIS — K529 Noninfective gastroenteritis and colitis, unspecified: Secondary | ICD-10-CM | POA: Diagnosis not present

## 2014-09-26 DIAGNOSIS — R509 Fever, unspecified: Secondary | ICD-10-CM | POA: Insufficient documentation

## 2014-09-26 DIAGNOSIS — J151 Pneumonia due to Pseudomonas: Secondary | ICD-10-CM | POA: Insufficient documentation

## 2014-09-26 DIAGNOSIS — M1990 Unspecified osteoarthritis, unspecified site: Secondary | ICD-10-CM | POA: Diagnosis not present

## 2014-09-26 DIAGNOSIS — R1084 Generalized abdominal pain: Secondary | ICD-10-CM | POA: Diagnosis not present

## 2014-09-26 DIAGNOSIS — K651 Peritoneal abscess: Secondary | ICD-10-CM | POA: Diagnosis not present

## 2014-09-26 DIAGNOSIS — F05 Delirium due to known physiological condition: Secondary | ICD-10-CM | POA: Diagnosis not present

## 2014-09-26 DIAGNOSIS — K219 Gastro-esophageal reflux disease without esophagitis: Secondary | ICD-10-CM | POA: Diagnosis not present

## 2014-09-26 DIAGNOSIS — F17210 Nicotine dependence, cigarettes, uncomplicated: Secondary | ICD-10-CM | POA: Diagnosis present

## 2014-09-26 DIAGNOSIS — K659 Peritonitis, unspecified: Secondary | ICD-10-CM | POA: Diagnosis present

## 2014-09-26 DIAGNOSIS — M797 Fibromyalgia: Secondary | ICD-10-CM | POA: Diagnosis present

## 2014-09-26 DIAGNOSIS — A419 Sepsis, unspecified organism: Secondary | ICD-10-CM | POA: Diagnosis not present

## 2014-09-26 DIAGNOSIS — G35 Multiple sclerosis: Secondary | ICD-10-CM | POA: Diagnosis present

## 2014-09-26 DIAGNOSIS — IMO0001 Reserved for inherently not codable concepts without codable children: Secondary | ICD-10-CM | POA: Insufficient documentation

## 2014-09-26 DIAGNOSIS — R2990 Unspecified symptoms and signs involving the nervous system: Secondary | ICD-10-CM

## 2014-09-26 DIAGNOSIS — G8929 Other chronic pain: Secondary | ICD-10-CM | POA: Insufficient documentation

## 2014-09-26 DIAGNOSIS — D696 Thrombocytopenia, unspecified: Secondary | ICD-10-CM | POA: Diagnosis not present

## 2014-09-26 DIAGNOSIS — Z79899 Other long term (current) drug therapy: Secondary | ICD-10-CM

## 2014-09-26 DIAGNOSIS — I639 Cerebral infarction, unspecified: Secondary | ICD-10-CM | POA: Diagnosis not present

## 2014-09-26 DIAGNOSIS — Z113 Encounter for screening for infections with a predominantly sexual mode of transmission: Secondary | ICD-10-CM | POA: Insufficient documentation

## 2014-09-26 DIAGNOSIS — Z981 Arthrodesis status: Secondary | ICD-10-CM

## 2014-09-26 DIAGNOSIS — I340 Nonrheumatic mitral (valve) insufficiency: Secondary | ICD-10-CM | POA: Diagnosis not present

## 2014-09-26 DIAGNOSIS — K559 Vascular disorder of intestine, unspecified: Secondary | ICD-10-CM | POA: Insufficient documentation

## 2014-09-26 DIAGNOSIS — J182 Hypostatic pneumonia, unspecified organism: Secondary | ICD-10-CM | POA: Diagnosis not present

## 2014-09-26 DIAGNOSIS — Z01818 Encounter for other preprocedural examination: Secondary | ICD-10-CM

## 2014-09-26 DIAGNOSIS — Z430 Encounter for attention to tracheostomy: Secondary | ICD-10-CM

## 2014-09-26 DIAGNOSIS — G629 Polyneuropathy, unspecified: Secondary | ICD-10-CM | POA: Diagnosis present

## 2014-09-26 DIAGNOSIS — I7090 Unspecified atherosclerosis: Secondary | ICD-10-CM | POA: Insufficient documentation

## 2014-09-26 DIAGNOSIS — R600 Localized edema: Secondary | ICD-10-CM

## 2014-09-26 DIAGNOSIS — I959 Hypotension, unspecified: Secondary | ICD-10-CM

## 2014-09-26 DIAGNOSIS — J9600 Acute respiratory failure, unspecified whether with hypoxia or hypercapnia: Secondary | ICD-10-CM | POA: Diagnosis not present

## 2014-09-26 DIAGNOSIS — R0600 Dyspnea, unspecified: Secondary | ICD-10-CM | POA: Diagnosis not present

## 2014-09-26 DIAGNOSIS — K631 Perforation of intestine (nontraumatic): Secondary | ICD-10-CM | POA: Diagnosis not present

## 2014-09-26 DIAGNOSIS — Z9889 Other specified postprocedural states: Secondary | ICD-10-CM | POA: Diagnosis not present

## 2014-09-26 DIAGNOSIS — Z4682 Encounter for fitting and adjustment of non-vascular catheter: Secondary | ICD-10-CM | POA: Diagnosis not present

## 2014-09-26 HISTORY — DX: Muscle weakness (generalized): M62.81

## 2014-09-26 HISTORY — DX: Ataxia, unspecified: R27.0

## 2014-09-26 LAB — TROPONIN I: Troponin I: <0.3 ng/mL (ref <0.30)

## 2014-09-26 LAB — COMPREHENSIVE METABOLIC PANEL
ALBUMIN: 2.1 g/dL — AB (ref 3.5–5.2)
ALT: 8 U/L (ref 0–35)
ANION GAP: 17 — AB (ref 5–15)
AST: 16 U/L (ref 0–37)
Alkaline Phosphatase: 109 U/L (ref 39–117)
BILIRUBIN TOTAL: 0.6 mg/dL (ref 0.3–1.2)
BUN: 10 mg/dL (ref 6–23)
CALCIUM: 9.3 mg/dL (ref 8.4–10.5)
CO2: 27 mEq/L (ref 19–32)
CREATININE: 1.63 mg/dL — AB (ref 0.50–1.10)
Chloride: 94 mEq/L — ABNORMAL LOW (ref 96–112)
GFR calc Af Amer: 42 mL/min — ABNORMAL LOW (ref >90)
GFR calc non Af Amer: 36 mL/min — ABNORMAL LOW (ref >90)
Glucose, Bld: 139 mg/dL — ABNORMAL HIGH (ref 70–99)
Potassium: 4.8 mEq/L (ref 3.7–5.3)
Sodium: 138 mEq/L (ref 137–147)
Total Protein: 6.8 g/dL (ref 6.0–8.3)

## 2014-09-26 LAB — AMMONIA: AMMONIA: 47 umol/L (ref 11–60)

## 2014-09-26 LAB — URINALYSIS, ROUTINE W REFLEX MICROSCOPIC
GLUCOSE, UA: 100 mg/dL — AB
HGB URINE DIPSTICK: NEGATIVE
Leukocytes, UA: NEGATIVE
Nitrite: NEGATIVE
PH: 5 (ref 5.0–8.0)
Protein, ur: 30 mg/dL — AB
Specific Gravity, Urine: >1.03 — ABNORMAL HIGH (ref 1.005–1.030)
Urobilinogen, UA: 0.2 mg/dL (ref 0.0–1.0)

## 2014-09-26 LAB — RAPID URINE DRUG SCREEN, HOSP PERFORMED
Amphetamines: NOT DETECTED
Barbiturates: NOT DETECTED
Benzodiazepines: NOT DETECTED
Cocaine: NOT DETECTED
OPIATES: NOT DETECTED
TETRAHYDROCANNABINOL: NOT DETECTED

## 2014-09-26 LAB — CBC WITH DIFFERENTIAL/PLATELET
BASOS PCT: 0 % (ref 0–1)
Basophils Absolute: 0 10*3/uL (ref 0.0–0.1)
EOS ABS: 0 10*3/uL (ref 0.0–0.7)
EOS PCT: 0 % (ref 0–5)
HEMATOCRIT: 60.4 % — AB (ref 36.0–46.0)
HEMOGLOBIN: 20.3 g/dL — AB (ref 12.0–15.0)
Lymphocytes Relative: 12 % (ref 12–46)
Lymphs Abs: 2.1 10*3/uL (ref 0.7–4.0)
MCH: 33.9 pg (ref 26.0–34.0)
MCHC: 33.6 g/dL (ref 30.0–36.0)
MCV: 101 fL — AB (ref 78.0–100.0)
MONO ABS: 0.8 10*3/uL (ref 0.1–1.0)
MONOS PCT: 4 % (ref 3–12)
NEUTROS ABS: 15.3 10*3/uL — AB (ref 1.7–7.7)
Neutrophils Relative %: 84 % — ABNORMAL HIGH (ref 43–77)
Platelets: 504 10*3/uL — ABNORMAL HIGH (ref 150–400)
RBC: 5.98 MIL/uL — ABNORMAL HIGH (ref 3.87–5.11)
RDW: 12.9 % (ref 11.5–15.5)
WBC: 18.2 10*3/uL — ABNORMAL HIGH (ref 4.0–10.5)

## 2014-09-26 LAB — POC OCCULT BLOOD, ED: Fecal Occult Bld: NEGATIVE

## 2014-09-26 LAB — URINE MICROSCOPIC-ADD ON

## 2014-09-26 LAB — SALICYLATE LEVEL: Salicylate Lvl: <2 mg/dL — ABNORMAL LOW (ref 2.8–20.0)

## 2014-09-26 LAB — LIPASE, BLOOD: Lipase: 19 U/L (ref 11–59)

## 2014-09-26 LAB — I-STAT CG4 LACTIC ACID, ED: Lactic Acid, Venous: 6.69 mmol/L — ABNORMAL HIGH (ref 0.5–2.2)

## 2014-09-26 LAB — LACTIC ACID, PLASMA: LACTIC ACID, VENOUS: 6.6 mmol/L — AB (ref 0.5–2.2)

## 2014-09-26 LAB — ACETAMINOPHEN LEVEL

## 2014-09-26 LAB — CBG MONITORING, ED: GLUCOSE-CAPILLARY: 161 mg/dL — AB (ref 70–99)

## 2014-09-26 LAB — ETHANOL: Alcohol, Ethyl (B): <11 mg/dL (ref 0–11)

## 2014-09-26 MED ORDER — SODIUM CHLORIDE 0.9 % IV BOLUS (SEPSIS)
1000.0000 mL | Freq: Once | INTRAVENOUS | Status: AC
  Administered 2014-09-26: 1000 mL via INTRAVENOUS

## 2014-09-26 MED ORDER — DEXTROSE 5 % IV SOLN
2.0000 g | Freq: Once | INTRAVENOUS | Status: AC
  Administered 2014-09-26: 2 g via INTRAVENOUS
  Filled 2014-09-26: qty 2

## 2014-09-26 MED ORDER — FENTANYL CITRATE 0.05 MG/ML IJ SOLN
50.0000 ug | Freq: Once | INTRAMUSCULAR | Status: AC
  Administered 2014-09-26: 50 ug via INTRAVENOUS
  Filled 2014-09-26: qty 2

## 2014-09-26 MED ORDER — CETYLPYRIDINIUM CHLORIDE 0.05 % MT LIQD: 7.0000 mL | Freq: Two times a day (BID) | OROMUCOSAL | Status: DC

## 2014-09-26 MED ORDER — ACETAMINOPHEN 325 MG PO TABS
650.0000 mg | ORAL_TABLET | Freq: Once | ORAL | Status: AC
  Administered 2014-09-26: 650 mg via ORAL
  Filled 2014-09-26: qty 2

## 2014-09-26 MED ORDER — SODIUM CHLORIDE 0.9 % IV SOLN
INTRAVENOUS | Status: DC
Start: 2014-09-26 — End: 2014-09-29
  Administered 2014-09-26 – 2014-09-27 (×3): via INTRAVENOUS

## 2014-09-26 MED ORDER — ONDANSETRON HCL 4 MG/2ML IJ SOLN
4.0000 mg | Freq: Once | INTRAMUSCULAR | Status: AC
  Administered 2014-09-26: 4 mg via INTRAVENOUS
  Filled 2014-09-26: qty 2

## 2014-09-26 MED ORDER — VANCOMYCIN HCL IN DEXTROSE 1-5 GM/200ML-% IV SOLN
1000.0000 mg | Freq: Once | INTRAVENOUS | Status: AC
  Administered 2014-09-26: 1000 mg via INTRAVENOUS
  Filled 2014-09-26: qty 200

## 2014-09-26 MED ORDER — CHLORHEXIDINE GLUCONATE 0.12 % MT SOLN
15.0000 mL | Freq: Two times a day (BID) | OROMUCOSAL | Status: DC
  Administered 2014-09-27: 15 mL via OROMUCOSAL

## 2014-09-26 NOTE — ED Provider Notes (Addendum)
TIME SEEN: 6:45 PM  CHIEF COMPLAINT: Abdominal pain, vomiting  HPI: Pt is a 48 y.o. F history of anxiety, depression, COPD, multiple sclerosis, fibromyalgia, chronic pain syndrome him to the emergency department with complaints of abdominal pain that started today, vomiting. No diarrhea. No reported fevers patient states she feels very cold. Denies any chest pain or shortness of breath. History is very limited as patient is a poor historian.  Denies h/o CHF, a fib, IVDA.  ROS: See HPI Constitutional: no fever  Eyes: no drainage  ENT: no runny nose   Cardiovascular:  no chest pain  Resp: no SOB  GI:  vomiting GU: no dysuria Integumentary: no rash  Allergy: no hives  Musculoskeletal: no leg swelling  Neurological: no slurred speech ROS otherwise negative  PAST MEDICAL HISTORY/PAST SURGICAL HISTORY:  Past Medical History  Diagnosis Date  . Anxiety   . Depression   . COPD (chronic obstructive pulmonary disease)   . MS (multiple sclerosis)   . Fibromyalgia   . Impaired fasting glucose   . 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 Eating disorder   . Chronic low back pain   . PONV (postoperative nausea and vomiting)   . Chronic bronchitis     "yearly" (02/11/2013)  . Borderline diabetes   . GERD (gastroesophageal reflux disease)   . Migraine     "I use to" (02/11/2013)  . Arthritis     "hands & feet" (02/11/2013)  . Peripheral neuropathy     [name] 02/11/2013  . B12 deficiency anemia     [name] 02/11/2013  . Chronic pain syndrome     [name] 02/11/2013  . UTI (lower urinary tract infection) 02/11/2013    [name] 02/11/2013  . Chronic edema     BLE/notes 02/11/2013  . Ataxia   . Peripheral neuropathy   . Muscle weakness of lower extremity     bilateral    MEDICATIONS:  Prior to Admission medications   Medication Sig Start Date End Date Taking? Authorizing Provider  albuterol (PROVENTIL HFA;VENTOLIN HFA) 108 (90 BASE) MCG/ACT inhaler Inhale 2 puffs into  the lungs every 6 (six) hours as needed for wheezing or shortness of breath (copd).    Historical Provider, MD  albuterol (PROVENTIL) (2.5 MG/3ML) 0.083% nebulizer solution Take 2.5 mg by nebulization every 4 (four) hours as needed for wheezing or shortness of breath (copd).    Historical Provider, MD  citalopram (CELEXA) 20 MG tablet Take 20 mg by mouth daily at 12 noon.     Historical Provider, MD  clonazePAM (KLONOPIN) 0.5 MG tablet Take 0.5 mg by mouth 2 (two) times daily as needed for anxiety.    Historical Provider, MD  cyclobenzaprine (FLEXERIL) 10 MG tablet TAKE (1) TABLET BY MOUTH AT BEDTIME. 07/01/14   Kathyrn Drown, MD  folic acid (FOLVITE) 1 MG tablet Take 1 tablet (1 mg total) by mouth daily. 04/05/14   Reyne Dumas, MD  gabapentin (NEURONTIN) 300 MG capsule One in am ,one 3pm, 2qhs 06/17/14   Kathyrn Drown, MD  meloxicam (MOBIC) 15 MG tablet TAKE 1 TABLET BY MOUTH DAILY. 09/14/14   Kathyrn Drown, MD  Multiple Vitamin (MULTIVITAMIN WITH MINERALS) TABS tablet Take 1 tablet by mouth daily at 12 noon.    Historical Provider, MD  nicotine (NICODERM CQ - DOSED IN MG/24 HOURS) 14 mg/24hr patch Place 1 patch (14 mg total) onto the skin daily. 04/05/14   Reyne Dumas, MD  oxyCODONE-acetaminophen (PERCOCET) 10-325 MG per  tablet Take 1 tablet by mouth every 4 (four) hours as needed for pain (no greater than 5 per day). 07/23/14   Kathyrn Drown, MD  potassium chloride (K-DUR) 10 MEQ tablet Take 20 mEq by mouth daily at 12 noon.     Historical Provider, MD  thiamine (VITAMIN B-1) 100 MG tablet Take 100 mg by mouth daily.    Historical Provider, MD  vitamin B-12 (CYANOCOBALAMIN) 1000 MCG tablet Take 1,000 mcg by mouth daily at 12 noon.    Historical Provider, MD    ALLERGIES:  Allergies  Allergen Reactions  . Ambien [Zolpidem Tartrate] Other (See Comments)    Crazy dreams  . Ceftin [Cefuroxime Axetil] Nausea And Vomiting  . Hctz [Hydrochlorothiazide] Other (See Comments)    Urinary retention  .  Hydrocodone Nausea Only  . Lodine [Etodolac] Hives  . Promethazine Other (See Comments)    Hallucinations.   . Roxicodone [Oxycodone Hcl] Swelling  . Codeine Rash  . Latex Rash  . Penicillins Rash    SOCIAL HISTORY:  History  Substance Use Topics  . Smoking status: Current Every Day Smoker -- 1.00 packs/day for 33 years    Types: Cigarettes  . Smokeless tobacco: Former Systems developer     Comment: 02/11/2013 offered smoking cessation materials; pt declines  . Alcohol Use: No    FAMILY HISTORY: Family History  Problem Relation Age of Onset  . Anesthesia problems Neg Hx   . Hypotension Neg Hx   . Malignant hyperthermia Neg Hx   . Pseudochol deficiency Neg Hx   . Hyperlipidemia Mother   . Heart disease Mother   . Cancer Mother     lung  . Diabetes Father   . Heart disease Father   . Cancer Maternal Grandmother     breast    EXAM: Pulse 141  Temp(Src) 97.5 F (36.4 C) (Oral)  Resp 12  Ht 4\' 9"  (1.448 m)  Wt 156 lb (70.761 kg)  BMI 33.75 kg/m2  SpO2 92% CONSTITUTIONAL: Alert and oriented and responds appropriately to questions. Patient is gray appearing, appears to be in distress secondary to pain, toxic appearing HEAD: Normocephalic EYES: Conjunctivae clear, PERRL ENT: normal nose; no rhinorrhea; moist mucous membranes; pharynx without lesions noted NECK: Supple, no meningismus, no LAD  CARD: Regular and tachycardic; S1 and S2 appreciated; no murmurs, no clicks, no rubs, no gallops RESP: Normal chest excursion without splinting or tachypnea; breath sounds clear and equal bilaterally; no wheezes, no rhonchi, no rales, no hypoxia or respiratory distress but patient does have grunting respirations ABD/GI: Normal bowel sounds; abdomen is mildly distended, diffusely tender with guarding and rebound, patient has peritoneal signs, patient has a surgical abdomen RECTAL:  Normal rectal tone, no gross blood or melena BACK:  The back appears normal and is non-tender to palpation, there is  no CVA tenderness EXT: Normal ROM in all joints; non-tender to palpation; no edema; normal capillary refill; no cyanosis; no calf tenderness or swelling, patient does have 1+ diminished bilateral palpable radial and DP pulses bilaterally   SKIN: Extremities are cool, skin has a gray appearance, no mottling, no rash NEURO: Moves all extremities equally, reports sensation is intact diffusely, cranial nerves II through XII intact PSYCH: The patient's mood and manner are appropriate. Grooming and personal hygiene are appropriate.  MEDICAL DECISION MAKING: Patient here with complaints of abdominal pain and vomiting. She appears to have a surgical abdomen on exam. She is hypotensive, tachycardic, febrile. She has cool extremities, gray appearing, hypotensive, tachycardia  I am concerned for sepsis.  Multiple peripheral IVs started. We'll give IV fluids, broad-spectrum antibiotics. We'll obtain labs, cultures, urinalysis, lactate and a portable upright abdominal x-ray as I am concerned for perforated bowel given her abdominal exam.  ED PROGRESS: Patient's labs show leukocytosis with left shift. Lactate is 6.6. Blood pressures continue to improve with IV fluids. She is still tachycardic. EKG shows a sinus tachycardia without ischemic changes. Patient denies chest pain or shortness of breath.  Acute abdominal x-ray shows possible pneumoperitoneum. Discussed this with radiology who recommends CT scan for further evaluation.   10:00 PM  Patient's CT scan shows ischemic small bowel with pneumoperitoneum. Called into the room as patient now has a cold, blue left upper extremity. She reports she is right-hand dominant. I am unable to Doppler radial or ulnar pulses but she does have a strong brachial pulse. She does have a 20-gauge IV in the antecubital fossa of this arm.  This IV still flushes easily with no signs of infiltration. Her compartments are soft. She does not have some paresthesias in the left hand. Will  contact general surgery, vascular surgery [facility] as patient will need to be transferred emergently. Patient reports she has a full code. Patient and her significant other at bedside have been updated with this plan. Have also discussed with them that patient is very ill.   Patient became extremely hypertensive but this has improved without intervention. She is still waiting for an ICU bed at [facility] and transport to arrive.  If no ICU bed when transport arrives, will transfer to ED as pt needs to go the operating room with general surgery and vascular surgery emergently.  Her extremity is slightly warmer but still no dopplerable pulse. I have spoke with Dr. [name] with general surgery who plans to see the patient in consult as well as Dr. Colleen Can with vascular surgery who also agrees to see the patient in consult. Spoke with Dr. Stevenson Clinch with critical care who agrees this up the patient in transfer to the surgical ICU. Accepting physician will be Dr. Baltazar Apo.    10:50 PM  Pt's heart rate is still in the 120s. Blood pressure 123/95. Her left extremity is still cold, blue, pulseless. [facility] at bedside. She has an ICU bed.  I have asked [facility] to transport pt via emergency transport.    EKG Interpretation  Date/Time:  Saturday September 26 2014 19:29:10 EST Ventricular Rate:  132 PR Interval:  107 QRS Duration: 72 QT Interval:  298 QTC Calculation: 442 R Axis:   63 Text Interpretation:  Sinus tachycardia Ventricular premature complex Consider right atrial enlargement Confirmed by WARD,  DO, KRISTEN ([id number]) on 09/26/2014 8:01:52 PM         CRITICAL CARE Performed by: Nyra Jabs   Total critical care time: 60 minutes  Critical care time was exclusive of separately billable procedures and treating other patients.  Critical care was necessary to treat or prevent imminent or life-threatening deterioration.  Critical care was time spent personally by me on the  following activities: development of treatment plan with patient and/or surrogate as well as nursing, discussions with consultants, evaluation of patient's response to treatment, examination of patient, obtaining history from patient or surrogate, ordering and performing treatments and interventions, ordering and review of laboratory studies, ordering and review of radiographic studies, pulse oximetry and re-evaluation of patient's condition.   [place], DO 09/26/14 [place], DO 09/27/14 [phone]

## 2014-09-26 NOTE — Progress Notes (Addendum)
Pharmacy Note:  Initial antibiotics for Vancomycin and Cefepime ordered by EDP for Sepsis.  Estimated Creatinine Clearance: 69.2 mL/min (by C-G formula based on Cr of 0.61).   Allergies  Allergen Reactions  . Ambien [Zolpidem Tartrate] Other (See Comments)    Crazy dreams  . Ceftin [Cefuroxime Axetil] Nausea And Vomiting  . Hctz [Hydrochlorothiazide] Other (See Comments)    Urinary retention  . Hydrocodone Nausea Only  . Lodine [Etodolac] Hives  . Promethazine Other (See Comments)    Hallucinations.   . Roxicodone [Oxycodone Hcl] Swelling  . Codeine Rash  . Latex Rash  . Penicillins Rash    Filed Vitals:   09/26/14 1838  Pulse: 141  Temp: 97.5 F (36.4 C)  Resp: 12    Anti-infectives    None     Allergies noted, PCN Rash, Ceftin N/V  Plan: Initial doses of Cefepim 2gm and Vancomycin 1gm X 1 ordered. F/U admission orders for further dosing if therapy continued. Likely transfer to [facility] according to [place], no admission orders at this time.  Pricilla Larsson, [facility] 09/26/2014 7:13 PM

## 2014-09-26 NOTE — ED Notes (Addendum)
Pt back from CT c/o right hand pain and numbness. Pt's hand was purple at this time, her hand was of normal color before going to CT. Charge nurse notified and she then notified Dr. Leonides Schanz. Dr. Leonides Schanz used the doppler and found no pulse in the left hand. Warm packs applied at this time.

## 2014-09-26 NOTE — ED Notes (Signed)
Per EMS, pt complain of pain in her right side for two days. Also,poor intake and constipation. Pt's heart rate is elevated breathing is shallow and skin is diaphoretic. Pt is alert and oriented but a poor historian. EDP aware

## 2014-09-26 NOTE — Consult Note (Signed)
Reason for Consult: Perforated small bowel Referring Physician: [name] is an 48 y.o. female.  HPI: [name] presented to [facility] with a one-day history of abdominal pain, nausea and vomiting. Workup there included CT scan of the abdomen and pelvis which showed free intraperitoneal air and perforation of her proximal jejunum. She was accepted in transfer by the critical care medicine service. She continues to complain of abdominal pain. She says it began yesterday. She denies abnormal bowel movements. She did have some nausea and vomiting. She denies other recent GI complaints. Of note, she had a prolonged hospitalization at [facility] last year during which she underwent tracheostomy and PEG tube placement. Both have been removed in the interim. Additionally, she developed a cold left hand while at the [facility] emergency department. Vascular surgery has been consult for this.  Past Medical History  Diagnosis Date  . Anxiety   . Depression   . COPD (chronic obstructive pulmonary disease)   . MS (multiple sclerosis)   . Fibromyalgia   . Impaired fasting glucose   . 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 Eating disorder   . Chronic low back pain   . PONV (postoperative nausea and vomiting)   . Chronic bronchitis     "yearly" (02/11/2013)  . Borderline diabetes   . GERD (gastroesophageal reflux disease)   . Migraine     "I use to" (02/11/2013)  . Arthritis     "hands & feet" (02/11/2013)  . Peripheral neuropathy     [name] 02/11/2013  . B12 deficiency anemia     [name] 02/11/2013  . Chronic pain syndrome     [name] 02/11/2013  . UTI (lower urinary tract infection) 02/11/2013    [name] 02/11/2013  . Chronic edema     BLE/notes 02/11/2013  . Ataxia   . Peripheral neuropathy   . Muscle weakness of lower extremity     bilateral    Past Surgical History  Procedure Laterality Date  . Abdominal hysterectomy  ~ 1987; ~ 2004     "woodward; ferguson" (02/11/2013)  . Cesarean section  4235; 1984; 1986  . Anterior cervical decomp/discectomy fusion  2009; 2011  . Tonsillectomy  1990's?  . Cataract extraction w/phaco  06/20/2011    Procedure: CATARACT EXTRACTION PHACO AND INTRAOCULAR LENS PLACEMENT (IOC);  Surgeon: [name] T. Gershon Crane;  Location: [facility];  Service: Ophthalmology;  Laterality: Left;  CDE 1.81  . Cataract extraction w/phaco  07/04/2011    Procedure: CATARACT EXTRACTION PHACO AND INTRAOCULAR LENS PLACEMENT (IOC);  Surgeon: [name] T. Gershon Crane;  Location: [facility];  Service: Ophthalmology;  Laterality: Right;  CDE: 1.76  . Yag laser application Right 3/61/4431    Procedure: YAG LASER APPLICATION;  Surgeon: [name] T. Gershon Crane, MD;  Location: [facility];  Service: Ophthalmology;  Laterality: Right;  . Yag laser application Left 5/40/0867    Procedure: YAG LASER APPLICATION;  Surgeon: [name] T. Gershon Crane, MD;  Location: [facility];  Service: Ophthalmology;  Laterality: Left;  . Appendectomy      Family History  Problem Relation Age of Onset  . Anesthesia problems Neg Hx   . Hypotension Neg Hx   . Malignant hyperthermia Neg Hx   . Pseudochol deficiency Neg Hx   . Hyperlipidemia Mother   . Heart disease Mother   . Cancer Mother     lung  . Diabetes Father   . Heart disease Father   . Cancer Maternal Grandmother  breast    Social History:  reports that she has been smoking Cigarettes.  She has a 33 pack-year smoking history. She has quit using smokeless tobacco. She reports that she does not drink alcohol or use illicit drugs.  Allergies:  Allergies  Allergen Reactions  . Ambien [Zolpidem Tartrate] Other (See Comments)    Crazy dreams  . Ceftin [Cefuroxime Axetil] Nausea And Vomiting  . Hctz [Hydrochlorothiazide] Other (See Comments)    Urinary retention  . Hydrocodone Nausea Only  . Lodine [Etodolac] Hives  . Promethazine Other (See Comments)    Hallucinations.   . Roxicodone [Oxycodone Hcl] Swelling  . Codeine Rash   . Latex Rash  . Penicillins Rash    Medications:  Prior to Admission:  Prescriptions prior to admission  Medication Sig Dispense Refill Last Dose  . citalopram (CELEXA) 20 MG tablet Take 20 mg by mouth daily at 12 noon.    09/26/2014  . clonazePAM (KLONOPIN) 0.5 MG tablet Take 0.5 mg by mouth 2 (two) times daily as needed for anxiety.   09/26/2014  . cyclobenzaprine (FLEXERIL) 10 MG tablet TAKE (1) TABLET BY MOUTH AT BEDTIME. 30 tablet 2 74/82/7078  . folic acid (FOLVITE) 1 MG tablet Take 1 tablet (1 mg total) by mouth daily. 60 tablet 2 09/26/2014  . gabapentin (NEURONTIN) 300 MG capsule One in am ,one 3pm, 2qhs (Patient taking differently: Take 300-600 mg by mouth 3 (three) times daily. One in am ,one at 3pm, and 2 qhs.) 120 capsule 6 09/26/2014  . meloxicam (MOBIC) 15 MG tablet TAKE 1 TABLET BY MOUTH DAILY. 30 tablet 4 09/26/2014  . Multiple Vitamin (MULTIVITAMIN WITH MINERALS) TABS tablet Take 1 tablet by mouth daily at 12 noon.   09/26/2014  . oxyCODONE-acetaminophen (PERCOCET) 10-325 MG per tablet Take 1 tablet by mouth every 4 (four) hours as needed for pain (no greater than 5 per day). 150 tablet 0 09/26/2014  . potassium chloride (K-DUR) 10 MEQ tablet Take 20 mEq by mouth daily at 12 noon.    09/26/2014  . thiamine (VITAMIN B-1) 100 MG tablet Take 100 mg by mouth daily.   09/26/2014  . vitamin B-12 (CYANOCOBALAMIN) 1000 MCG tablet Take 1,000 mcg by mouth daily at 12 noon.   09/26/2014  . albuterol (PROVENTIL HFA;VENTOLIN HFA) 108 (90 BASE) MCG/ACT inhaler Inhale 2 puffs into the lungs every 6 (six) hours as needed for wheezing or shortness of breath (copd).   09/24/2014  . albuterol (PROVENTIL) (2.5 MG/3ML) 0.083% nebulizer solution Take 2.5 mg by nebulization every 4 (four) hours as needed for wheezing or shortness of breath (copd).   More Than A Month  . nicotine (NICODERM CQ - DOSED IN MG/24 HOURS) 14 mg/24hr patch Place 1 patch (14 mg total) onto the skin daily. 28 patch 0 More  Than A Month    Results for orders placed or performed during the hospital encounter of 09/26/14 (from the past 48 hour(s))  CBG monitoring, ED     Status: Abnormal   Collection Time: 09/26/14  6:52 PM  Result Value Ref Range   Glucose-Capillary 161 (H) 70 - 99 mg/dL  CBC with Differential     Status: Abnormal   Collection Time: 09/26/14  7:00 PM  Result Value Ref Range   WBC 18.2 (H) 4.0 - 10.5 K/uL   RBC 5.98 (H) 3.87 - 5.11 MIL/uL   Hemoglobin 20.3 (H) 12.0 - 15.0 g/dL   HCT 60.4 (H) 36.0 - 46.0 %   MCV 101.0 (H)  78.0 - 100.0 fL   MCH 33.9 26.0 - 34.0 pg   MCHC 33.6 30.0 - 36.0 g/dL   RDW 12.9 11.5 - 15.5 %   Platelets 504 (H) 150 - 400 K/uL   Neutrophils Relative % 84 (H) 43 - 77 %   Neutro Abs 15.3 (H) 1.7 - 7.7 K/uL   Lymphocytes Relative 12 12 - 46 %   Lymphs Abs 2.1 0.7 - 4.0 K/uL   Monocytes Relative 4 3 - 12 %   Monocytes Absolute 0.8 0.1 - 1.0 K/uL   Eosinophils Relative 0 0 - 5 %   Eosinophils Absolute 0.0 0.0 - 0.7 K/uL   Basophils Relative 0 0 - 1 %   Basophils Absolute 0.0 0.0 - 0.1 K/uL  Comprehensive metabolic panel     Status: Abnormal   Collection Time: 09/26/14  7:00 PM  Result Value Ref Range   Sodium 138 137 - 147 mEq/L   Potassium 4.8 3.7 - 5.3 mEq/L   Chloride 94 (L) 96 - 112 mEq/L   CO2 27 19 - 32 mEq/L   Glucose, Bld 139 (H) 70 - 99 mg/dL   BUN 10 6 - 23 mg/dL   Creatinine, Ser 1.63 (H) 0.50 - 1.10 mg/dL   Calcium 9.3 8.4 - 10.5 mg/dL   Total Protein 6.8 6.0 - 8.3 g/dL   Albumin 2.1 (L) 3.5 - 5.2 g/dL   AST 16 0 - 37 U/L   ALT 8 0 - 35 U/L   Alkaline Phosphatase 109 39 - 117 U/L   Total Bilirubin 0.6 0.3 - 1.2 mg/dL   GFR calc non Af Amer 36 (L) >90 mL/min   GFR calc Af Amer 42 (L) >90 mL/min    Comment: (NOTE) The eGFR has been calculated using the CKD EPI equation. This calculation has not been validated in all clinical situations. eGFR's persistently <90 mL/min signify possible Chronic Kidney Disease.    Anion gap 17 (H) 5 - 15   Troponin I     Status: None   Collection Time: 09/26/14  7:00 PM  Result Value Ref Range   Troponin I <0.30 <0.30 ng/mL    Comment:        Due to the release kinetics of cTnI, a negative result within the first hours of the onset of symptoms does not rule out myocardial infarction with certainty. If myocardial infarction is still suspected, repeat the test at appropriate intervals.   Ethanol     Status: None   Collection Time: 09/26/14  7:00 PM  Result Value Ref Range   Alcohol, Ethyl (B) <11 0 - 11 mg/dL    Comment:        LOWEST DETECTABLE LIMIT FOR SERUM ALCOHOL IS 11 mg/dL FOR MEDICAL PURPOSES ONLY   Acetaminophen level     Status: None   Collection Time: 09/26/14  7:00 PM  Result Value Ref Range   Acetaminophen (Tylenol), Serum <15.0 10 - 30 ug/mL    Comment:        THERAPEUTIC CONCENTRATIONS VARY SIGNIFICANTLY. A RANGE OF 10-30 ug/mL MAY BE AN EFFECTIVE CONCENTRATION FOR MANY PATIENTS. HOWEVER, SOME ARE BEST TREATED AT CONCENTRATIONS OUTSIDE THIS RANGE. ACETAMINOPHEN CONCENTRATIONS >150 ug/mL AT 4 HOURS AFTER INGESTION AND >50 ug/mL AT 12 HOURS AFTER INGESTION ARE OFTEN ASSOCIATED WITH TOXIC REACTIONS.   Salicylate level     Status: Abnormal   Collection Time: 09/26/14  7:00 PM  Result Value Ref Range   Salicylate Lvl <5.2 (L) 2.8 -  20.0 mg/dL  POC occult blood, ED     Status: None   Collection Time: 09/26/14  7:04 PM  Result Value Ref Range   Fecal Occult Bld NEGATIVE NEGATIVE  Urinalysis, Routine w reflex microscopic     Status: Abnormal   Collection Time: 09/26/14  7:22 PM  Result Value Ref Range   Color, Urine AMBER (A) YELLOW    Comment: BIOCHEMICALS MAY BE AFFECTED BY COLOR   APPearance HAZY (A) CLEAR   Specific Gravity, Urine >1.030 (H) 1.005 - 1.030   pH 5.0 5.0 - 8.0   Glucose, UA 100 (A) NEGATIVE mg/dL   Hgb urine dipstick NEGATIVE NEGATIVE   Bilirubin Urine MODERATE (A) NEGATIVE   Ketones, ur TRACE (A) NEGATIVE mg/dL   Protein, ur 30 (A)  NEGATIVE mg/dL   Urobilinogen, UA 0.2 0.0 - 1.0 mg/dL   Nitrite NEGATIVE NEGATIVE   Leukocytes, UA NEGATIVE NEGATIVE  Urine rapid drug screen (hosp performed)     Status: None   Collection Time: 09/26/14  7:22 PM  Result Value Ref Range   Opiates NONE DETECTED NONE DETECTED   Cocaine NONE DETECTED NONE DETECTED   Benzodiazepines NONE DETECTED NONE DETECTED   Amphetamines NONE DETECTED NONE DETECTED   Tetrahydrocannabinol NONE DETECTED NONE DETECTED   Barbiturates NONE DETECTED NONE DETECTED    Comment:        DRUG SCREEN FOR MEDICAL PURPOSES ONLY.  IF CONFIRMATION IS NEEDED FOR ANY PURPOSE, NOTIFY LAB WITHIN 5 DAYS.        LOWEST DETECTABLE LIMITS FOR URINE DRUG SCREEN Drug Class       Cutoff (ng/mL) Amphetamine      1000 Barbiturate      200 Benzodiazepine   361 Tricyclics       443 Opiates          300 Cocaine          300 THC              50   Urine microscopic-add on     Status: Abnormal   Collection Time: 09/26/14  7:22 PM  Result Value Ref Range   Squamous Epithelial / LPF MANY (A) RARE   WBC, UA 3-6 <3 WBC/hpf   RBC / HPF 0-2 <3 RBC/hpf   Bacteria, UA FEW (A) RARE   Urine-Other AMORPHOUS URATES/PHOSPHATES   Ammonia     Status: None   Collection Time: 09/26/14  8:56 PM  Result Value Ref Range   Ammonia 47 11 - 60 umol/L  Lactic acid, plasma     Status: Abnormal   Collection Time: 09/26/14  8:56 PM  Result Value Ref Range   Lactic Acid, Venous 6.6 (H) 0.5 - 2.2 mmol/L  Lipase, blood     Status: None   Collection Time: 09/26/14  8:56 PM  Result Value Ref Range   Lipase 19 11 - 59 U/L  Blood culture (routine x 2)     Status: None (Preliminary result)   Collection Time: 09/26/14  8:56 PM  Result Value Ref Range   Specimen Description LEFT ANTECUBITAL    Special Requests BOTTLES DRAWN AEROBIC ONLY 4CC DRAWN BY RN    Culture PENDING    Report Status PENDING   Blood culture (routine x 2)     Status: None (Preliminary result)   Collection Time: 09/26/14  9:00  PM  Result Value Ref Range   Specimen Description LEFT ANTECUBITAL    Special Requests BOTTLES DRAWN AEROBIC ONLY 4CC DRAWN BY  RN    Culture PENDING    Report Status PENDING   I-Stat CG4 Lactic Acid, ED     Status: Abnormal   Collection Time: 09/26/14  9:34 PM  Result Value Ref Range   Lactic Acid, Venous 6.69 (H) 0.5 - 2.2 mmol/L    Ct Abdomen Pelvis Wo Contrast  09/26/2014   CLINICAL DATA:  RIGHT upper quadrant pain that began 2 days ago. Nausea and vomiting. Shallow breathing.  EXAM: CT ABDOMEN AND PELVIS WITHOUT CONTRAST  TECHNIQUE: Multidetector CT imaging of the abdomen and pelvis was performed following the standard protocol without IV contrast.  COMPARISON:  Portable abdominal radiograph earlier today.  FINDINGS: Pneumoperitoneum greatest in the LEFT paramedian mesentery. Generalized ascites. Thickened distal duodenum and proximal jejunal small bowel loop, possible pneumatosis. Suspect ischemic bowel with proximal jejunal perforation. No features to suggest colonic perforation due to diverticulitis, or gastroduodenal ulcer.  Mild atheromatous change of the aorta without aneurysmal dilatation or visible proximal emboli.  Grossly unremarkable appearance of the liver, spleen, pancreas, adrenal glands, and kidneys given the unenhanced appearance. No acute osseous abnormalities. Patchy bibasilar opacities, RIGHT greater than LEFT.  IMPRESSION: Suspected ischemic proximal small bowel, with pneumoperitoneum and ascites. Findings discussed with ordering provider by myself at 8:45 p.m. 09/26/14.   Electronically Signed   By: Rolla Flatten M.D.   On: 09/26/2014 20:49   Dg Chest Port 1 View  09/26/2014   CLINICAL DATA:  Altered mental status, abdominal pain  EXAM: PORTABLE CHEST - 1 VIEW  COMPARISON:  Chest radiograph 04/01/2014  FINDINGS: Anterior cervical fusion noted. Normal cardiac silhouette. Linear opacity at the right lung base which new from prior. Low lung volumes.  IMPRESSION: Right lower  lobe atelectasis versus infiltrate.   Electronically Signed   By: Suzy Bouchard M.D.   On: 09/26/2014 20:00   Dg Abd Portable 1v  09/26/2014   CLINICAL DATA:  Altered mental status with abdominal pain.  EXAM: PORTABLE ABDOMEN - 1 VIEW  COMPARISON:  None.  FINDINGS: Suboptimal visualization due to the portable technique and body habitus. There are areas of concern for extraluminal air between the stomach bubble and splenic flexure under the LEFT hemidiaphragm, possible similar area under the RIGHT hemidiaphragm over the liver. No definite bowel obstruction. Osseous structures poorly visualized. Grossly unremarkable lung bases  IMPRESSION: Possible pneumoperitoneum. I discussed the findings with the emergency department physician at the time of interpretation. A noncontrast CT will be obtained as soon as possible for further evaluation.   Electronically Signed   By: Rolla Flatten M.D.   On: 09/26/2014 20:02    Review of Systems  Unable to perform ROS: acuity of condition   Blood pressure 123/90, pulse 125, temperature 100.8 F (38.2 C), temperature source Other (Comment), resp. rate 24, height _0  (1.448 m), weight 156 lb (70.761 kg), SpO2 96 %. Physical Exam  Constitutional: She appears well-developed. She appears distressed.  HENT:  Head: Normocephalic and atraumatic.  Nose: Nose normal.  Mouth/Throat: No oropharyngeal exudate.  Eyes: Pupils are equal, round, and reactive to light.  Neck: Neck supple.  Tracheostomy site scar  Cardiovascular:  Tachycardic 130s  Respiratory: Effort normal and breath sounds normal. No respiratory distress. She has no wheezes. She has no rales.  GI: She exhibits distension. There is tenderness. There is rebound and guarding.  Peritonitis, lower midline scar, PEG tube site scar  Musculoskeletal:  Left hand is mottled and cool with no palpable pulse at the wrist  Neurological: She is  alert.  Follows commands, somewhat delayed in answering questions  Skin:   Cool    Assessment/Plan: Perforated small bowel - IV antibiotics, will take for emergent exploratory laparotomy. Procedure, risks, and benefits were discussed in detail with her and her boyfriend who is her power of attorney. I explained that we may have to do this in stages with initial surgery and leaving her abdomen open for further operation. I did advise her this will require being on the ventilator for a period of time. She is agreeable.  Cold left hand - vascular surgery consult pending.  [name] 09/26/2014, 11:50 PM

## 2014-09-26 NOTE — ED Notes (Signed)
Family states that pt started having abdominal pain Thursday night. Pain continued so pt's boyfriend made her come to the ED. Pt states she had been eating fine until last night.

## 2014-09-27 ENCOUNTER — Inpatient Hospital Stay (HOSPITAL_COMMUNITY): Payer: BLUE CROSS/BLUE SHIELD

## 2014-09-27 ENCOUNTER — Inpatient Hospital Stay (HOSPITAL_COMMUNITY): Payer: BLUE CROSS/BLUE SHIELD | Admitting: Anesthesiology

## 2014-09-27 ENCOUNTER — Encounter (HOSPITAL_COMMUNITY): Payer: Self-pay | Admitting: Certified Registered"

## 2014-09-27 ENCOUNTER — Encounter (HOSPITAL_COMMUNITY)
Admission: EM | Disposition: A | Discharge status: Nursing Home (Includes ICF) | Payer: Self-pay | Source: Home / Self Care | Attending: Pulmonary Disease

## 2014-09-27 DIAGNOSIS — I998 Other disorder of circulatory system: Secondary | ICD-10-CM

## 2014-09-27 DIAGNOSIS — R6520 Severe sepsis without septic shock: Secondary | ICD-10-CM

## 2014-09-27 DIAGNOSIS — R0602 Shortness of breath: Secondary | ICD-10-CM

## 2014-09-27 DIAGNOSIS — M7989 Other specified soft tissue disorders: Secondary | ICD-10-CM

## 2014-09-27 DIAGNOSIS — A419 Sepsis, unspecified organism: Secondary | ICD-10-CM

## 2014-09-27 DIAGNOSIS — K251 Acute gastric ulcer with perforation: Secondary | ICD-10-CM

## 2014-09-27 DIAGNOSIS — J9601 Acute respiratory failure with hypoxia: Secondary | ICD-10-CM

## 2014-09-27 DIAGNOSIS — I70208 Unspecified atherosclerosis of native arteries of extremities, other extremity: Secondary | ICD-10-CM

## 2014-09-27 DIAGNOSIS — K255 Chronic or unspecified gastric ulcer with perforation: Secondary | ICD-10-CM

## 2014-09-27 HISTORY — PX: LAPAROTOMY: SHX154

## 2014-09-27 HISTORY — PX: EMBOLECTOMY: SHX44

## 2014-09-27 LAB — BLOOD GAS, ARTERIAL
Acid-base deficit: 6.9 mmol/L — ABNORMAL HIGH (ref 0.0–2.0)
Bicarbonate: 20.5 mEq/L (ref 20.0–24.0)
DRAWN BY: 28340
FIO2: 0.6 %
LHR: 16 {breaths}/min
MECHVT: 320 mL
O2 Saturation: 96 %
PCO2 ART: 57.2 mmHg — AB (ref 35.0–45.0)
PEEP/CPAP: 5 cmH2O
PO2 ART: 102 mmHg — AB (ref 80.0–100.0)
Patient temperature: 97.3
TCO2: 22.3 mmol/L (ref 0–100)
pH, Arterial: 7.173 — CL (ref 7.350–7.450)

## 2014-09-27 LAB — BASIC METABOLIC PANEL
Anion gap: 12 (ref 5–15)
BUN: 9 mg/dL (ref 6–23)
CO2: 20 meq/L (ref 19–32)
Calcium: 6.5 mg/dL — ABNORMAL LOW (ref 8.4–10.5)
Chloride: 110 mEq/L (ref 96–112)
Creatinine, Ser: 0.69 mg/dL (ref 0.50–1.10)
GFR calc Af Amer: >90 mL/min (ref >90)
GFR calc non Af Amer: >90 mL/min (ref >90)
Glucose, Bld: 140 mg/dL — ABNORMAL HIGH (ref 70–99)
POTASSIUM: 4.5 meq/L (ref 3.7–5.3)
SODIUM: 142 meq/L (ref 137–147)

## 2014-09-27 LAB — POCT I-STAT 3, ART BLOOD GAS (G3+)
ACID-BASE DEFICIT: 5 mmol/L — AB (ref 0.0–2.0)
BICARBONATE: 19.6 meq/L — AB (ref 20.0–24.0)
O2 Saturation: 87 %
PO2 ART: 59 mmHg — AB (ref 80.0–100.0)
Patient temperature: 100.3
TCO2: 21 mmol/L (ref 0–100)
pCO2 arterial: 36.6 mmHg (ref 35.0–45.0)
pH, Arterial: 7.341 — ABNORMAL LOW (ref 7.350–7.450)

## 2014-09-27 LAB — CBC
HCT: 43.8 % (ref 36.0–46.0)
Hemoglobin: 13.7 g/dL (ref 12.0–15.0)
MCH: 31.3 pg (ref 26.0–34.0)
MCHC: 31.3 g/dL (ref 30.0–36.0)
MCV: 100 fL (ref 78.0–100.0)
Platelets: 265 10*3/uL (ref 150–400)
RBC: 4.38 MIL/uL (ref 3.87–5.11)
RDW: 12.6 % (ref 11.5–15.5)
WBC: 22.6 10*3/uL — AB (ref 4.0–10.5)

## 2014-09-27 LAB — TYPE AND SCREEN
ABO/RH(D): O POS
Antibody Screen: NEGATIVE

## 2014-09-27 LAB — MRSA PCR SCREENING: MRSA by PCR: NEGATIVE

## 2014-09-27 LAB — ABO/RH: ABO/RH(D): O POS

## 2014-09-27 SURGERY — LAPAROTOMY, EXPLORATORY
Anesthesia: General | Site: Arm Upper

## 2014-09-27 SURGERY — EMBOLECTOMY
Anesthesia: General | Site: Arm Lower | Laterality: Left

## 2014-09-27 MED ORDER — MIDAZOLAM HCL 2 MG/2ML IJ SOLN
INTRAMUSCULAR | Status: AC
  Filled 2014-09-27: qty 4

## 2014-09-27 MED ORDER — FENTANYL BOLUS VIA INFUSION
50.0000 ug | INTRAVENOUS | Status: DC | PRN
  Administered 2014-09-27 – 2014-10-03 (×11): 50 ug via INTRAVENOUS
  Filled 2014-09-27: qty 50

## 2014-09-27 MED ORDER — MIDAZOLAM HCL 5 MG/5ML IJ SOLN
INTRAMUSCULAR | Status: DC | PRN
  Administered 2014-09-27 (×2): 2 mg via INTRAVENOUS

## 2014-09-27 MED ORDER — FENTANYL CITRATE 0.05 MG/ML IJ SOLN: 50.0000 ug | Freq: Once | INTRAMUSCULAR | Status: DC

## 2014-09-27 MED ORDER — HEPARIN SODIUM (PORCINE) 1000 UNIT/ML IJ SOLN
INTRAMUSCULAR | Status: AC
  Filled 2014-09-27: qty 1

## 2014-09-27 MED ORDER — ARTIFICIAL TEARS OP OINT
TOPICAL_OINTMENT | OPHTHALMIC | Status: DC | PRN
  Administered 2014-09-27: 1 via OPHTHALMIC

## 2014-09-27 MED ORDER — ETOMIDATE 2 MG/ML IV SOLN
INTRAVENOUS | Status: DC | PRN
  Administered 2014-09-27: 16 mg via INTRAVENOUS

## 2014-09-27 MED ORDER — SUCCINYLCHOLINE CHLORIDE 20 MG/ML IJ SOLN
INTRAMUSCULAR | Status: DC | PRN
  Administered 2014-09-27: 70 mg via INTRAVENOUS

## 2014-09-27 MED ORDER — NICOTINE 14 MG/24HR TD PT24
14.0000 mg | MEDICATED_PATCH | Freq: Every day | TRANSDERMAL | Status: DC
  Administered 2014-09-27 – 2014-09-30 (×4): 14 mg via TRANSDERMAL
  Filled 2014-09-27 (×4): qty 1

## 2014-09-27 MED ORDER — SODIUM CHLORIDE 0.9 % IV BOLUS (SEPSIS): 500.0000 mL | Freq: Once | INTRAVENOUS | Status: DC

## 2014-09-27 MED ORDER — ALBUMIN HUMAN 5 % IV SOLN: INTRAVENOUS | Status: DC | PRN

## 2014-09-27 MED ORDER — MIDAZOLAM HCL 2 MG/2ML IJ SOLN
INTRAMUSCULAR | Status: AC
  Filled 2014-09-27: qty 2

## 2014-09-27 MED ORDER — SODIUM CHLORIDE 0.9 % IV SOLN
INTRAVENOUS | Status: DC | PRN
  Administered 2014-09-26 – 2014-09-27 (×2): via INTRAVENOUS

## 2014-09-27 MED ORDER — HYDROCORTISONE NA SUCCINATE PF 100 MG IJ SOLR
INTRAMUSCULAR | Status: DC | PRN
  Administered 2014-09-27: 100 mg via INTRAVENOUS

## 2014-09-27 MED ORDER — PANTOPRAZOLE SODIUM 40 MG IV SOLR
40.0000 mg | Freq: Every day | INTRAVENOUS | Status: DC
  Administered 2014-09-27 – 2014-10-06 (×10): 40 mg via INTRAVENOUS
  Filled 2014-09-27 (×14): qty 40

## 2014-09-27 MED ORDER — VANCOMYCIN HCL IN DEXTROSE 1-5 GM/200ML-% IV SOLN
1000.0000 mg | Freq: Two times a day (BID) | INTRAVENOUS | Status: DC
  Administered 2014-09-27 – 2014-10-02 (×11): 1000 mg via INTRAVENOUS
  Filled 2014-09-27 (×11): qty 200

## 2014-09-27 MED ORDER — IPRATROPIUM-ALBUTEROL 0.5-2.5 (3) MG/3ML IN SOLN
3.0000 mL | RESPIRATORY_TRACT | Status: DC | PRN
  Administered 2014-10-07: 3 mL via RESPIRATORY_TRACT
  Filled 2014-09-27: qty 3

## 2014-09-27 MED ORDER — VECURONIUM BROMIDE 10 MG IV SOLR
INTRAVENOUS | Status: DC | PRN
  Administered 2014-09-27 (×3): 10 mg via INTRAVENOUS

## 2014-09-27 MED ORDER — CALCIUM CHLORIDE 10 % IV SOLN
INTRAVENOUS | Status: DC | PRN
  Administered 2014-09-27: 1 g via INTRAVENOUS

## 2014-09-27 MED ORDER — HYDROCORTISONE NA SUCCINATE PF 100 MG IJ SOLR
INTRAMUSCULAR | Status: AC
  Filled 2014-09-27: qty 2

## 2014-09-27 MED ORDER — CHLORHEXIDINE GLUCONATE 0.12 % MT SOLN
15.0000 mL | Freq: Two times a day (BID) | OROMUCOSAL | Status: DC
  Administered 2014-09-27 – 2014-10-07 (×20): 15 mL via OROMUCOSAL
  Filled 2014-09-27 (×21): qty 15

## 2014-09-27 MED ORDER — ALBUMIN HUMAN 5 % IV SOLN
INTRAVENOUS | Status: DC | PRN
  Administered 2014-09-27 (×2): via INTRAVENOUS

## 2014-09-27 MED ORDER — DEXMEDETOMIDINE HCL IN NACL 200 MCG/50ML IV SOLN
INTRAVENOUS | Status: AC
  Filled 2014-09-27: qty 50

## 2014-09-27 MED ORDER — FENTANYL CITRATE 0.05 MG/ML IJ SOLN
INTRAMUSCULAR | Status: AC
  Filled 2014-09-27: qty 2

## 2014-09-27 MED ORDER — ETOMIDATE 2 MG/ML IV SOLN
20.0000 mg | Freq: Once | INTRAVENOUS | Status: AC
  Administered 2014-09-27: 20 mg via INTRAVENOUS

## 2014-09-27 MED ORDER — EPHEDRINE SULFATE 50 MG/ML IJ SOLN
INTRAMUSCULAR | Status: DC | PRN
  Administered 2014-09-27 (×2): 5 mg via INTRAVENOUS

## 2014-09-27 MED ORDER — SODIUM CHLORIDE 0.9 % IV SOLN
250.0000 mL | INTRAVENOUS | Status: DC | PRN
  Administered 2014-10-03: 250 mL via INTRAVENOUS

## 2014-09-27 MED ORDER — FENTANYL CITRATE 0.05 MG/ML IJ SOLN
INTRAMUSCULAR | Status: AC
  Filled 2014-09-27: qty 5

## 2014-09-27 MED ORDER — ALBUMIN HUMAN 5 % IV SOLN
INTRAVENOUS | Status: DC | PRN
  Administered 2014-09-27 (×2): via INTRAVENOUS

## 2014-09-27 MED ORDER — 0.9 % SODIUM CHLORIDE (POUR BTL) OPTIME
TOPICAL | Status: DC | PRN
  Administered 2014-09-27: 1000 mL

## 2014-09-27 MED ORDER — HEPARIN (PORCINE) IN NACL 100-0.45 UNIT/ML-% IJ SOLN
1900.0000 [IU]/h | INTRAMUSCULAR | Status: DC
  Administered 2014-09-27: 700 [IU]/h via INTRAVENOUS
  Administered 2014-09-28: 1400 [IU]/h via INTRAVENOUS
  Administered 2014-09-29: 1500 [IU]/h via INTRAVENOUS
  Administered 2014-09-30 – 2014-10-02 (×3): 1600 [IU]/h via INTRAVENOUS
  Filled 2014-09-27 (×15): qty 250

## 2014-09-27 MED ORDER — HEMOSTATIC AGENTS (NO CHARGE) OPTIME
TOPICAL | Status: DC | PRN
  Administered 2014-09-27: 1 via TOPICAL

## 2014-09-27 MED ORDER — IOHEXOL 300 MG/ML  SOLN
INTRAMUSCULAR | Status: DC | PRN
  Administered 2014-09-27: 40 mL via INTRA_ARTERIAL

## 2014-09-27 MED ORDER — 0.9 % SODIUM CHLORIDE (POUR BTL) OPTIME
TOPICAL | Status: DC | PRN
  Administered 2014-09-27: 2000 mL
  Administered 2014-09-27: 3000 mL

## 2014-09-27 MED ORDER — ONDANSETRON HCL 4 MG/2ML IJ SOLN
4.0000 mg | Freq: Four times a day (QID) | INTRAMUSCULAR | Status: DC | PRN
  Administered 2014-10-07 – 2014-10-18 (×3): 4 mg via INTRAVENOUS
  Filled 2014-09-27 (×3): qty 2

## 2014-09-27 MED ORDER — HEPARIN SODIUM (PORCINE) 1000 UNIT/ML IJ SOLN
INTRAMUSCULAR | Status: DC | PRN
  Administered 2014-09-27: 7000 [IU] via INTRAVENOUS
  Administered 2014-09-27: 2000 [IU] via INTRAVENOUS

## 2014-09-27 MED ORDER — DEXMEDETOMIDINE HCL IN NACL 200 MCG/50ML IV SOLN
0.4000 ug/kg/h | INTRAVENOUS | Status: DC
  Administered 2014-09-27: 0.5 ug/kg/h via INTRAVENOUS
  Administered 2014-09-27: 0.7 ug/kg/h via INTRAVENOUS
  Administered 2014-09-28: 1.2 ug/kg/h via INTRAVENOUS
  Filled 2014-09-27 (×4): qty 50

## 2014-09-27 MED ORDER — SODIUM CHLORIDE 0.9 % IV SOLN
25.0000 ug/h | INTRAVENOUS | Status: DC
  Administered 2014-09-27: 150 ug/h via INTRAVENOUS
  Administered 2014-09-27: 50 ug/h via INTRAVENOUS
  Administered 2014-09-28 (×4): 400 ug/h via INTRAVENOUS
  Administered 2014-09-29: 300 ug/h via INTRAVENOUS
  Administered 2014-09-29: 250 ug/h via INTRAVENOUS
  Administered 2014-09-30 – 2014-10-02 (×6): 300 ug/h via INTRAVENOUS
  Administered 2014-10-02: 150 ug/h via INTRAVENOUS
  Administered 2014-10-03: 200 ug/h via INTRAVENOUS
  Administered 2014-10-05: 50 ug/h via INTRAVENOUS
  Administered 2014-10-05: 150 ug/h via INTRAVENOUS
  Administered 2014-10-06: 200 ug/h via INTRAVENOUS
  Filled 2014-09-27 (×21): qty 50

## 2014-09-27 MED ORDER — DEXTROSE 5 % IV SOLN
2.0000 g | INTRAVENOUS | Status: DC
  Filled 2014-09-27: qty 2

## 2014-09-27 MED ORDER — ALTEPLASE 100 MG IV SOLR
10.0000 mg | Freq: Once | INTRAVENOUS | Status: DC
  Filled 2014-09-27: qty 10

## 2014-09-27 MED ORDER — VANCOMYCIN HCL IN DEXTROSE 1-5 GM/200ML-% IV SOLN
1000.0000 mg | INTRAVENOUS | Status: DC
  Filled 2014-09-27: qty 200

## 2014-09-27 MED ORDER — SODIUM CHLORIDE 0.9 % IV SOLN
10.0000 mg | INTRAVENOUS | Status: DC | PRN
  Administered 2014-09-27: 15 ug/min via INTRAVENOUS

## 2014-09-27 MED ORDER — IOHEXOL 350 MG/ML SOLN
100.0000 mL | Freq: Once | INTRAVENOUS | Status: AC | PRN
  Administered 2014-09-27: 100 mL via INTRAVENOUS

## 2014-09-27 MED ORDER — ACETAMINOPHEN 325 MG PO TABS
650.0000 mg | ORAL_TABLET | ORAL | Status: DC | PRN
  Administered 2014-09-27: 650 mg via ORAL
  Filled 2014-09-27: qty 2

## 2014-09-27 MED ORDER — SODIUM CHLORIDE 0.9 % IR SOLN
Status: DC | PRN
  Administered 2014-09-27: 500 mL

## 2014-09-27 MED ORDER — ROCURONIUM BROMIDE 50 MG/5ML IV SOLN
1.0000 mg/kg | Freq: Once | INTRAVENOUS | Status: AC
  Administered 2014-09-27: 70.8 mg via INTRAVENOUS

## 2014-09-27 MED ORDER — SODIUM CHLORIDE 0.9 % IV BOLUS (SEPSIS)
500.0000 mL | Freq: Once | INTRAVENOUS | Status: AC
  Administered 2014-09-27: 500 mL via INTRAVENOUS

## 2014-09-27 MED ORDER — HEPARIN SODIUM (PORCINE) 1000 UNIT/ML IJ SOLN
INTRAMUSCULAR | Status: DC | PRN
  Administered 2014-09-27: 5000 [IU] via INTRAVENOUS
  Administered 2014-09-27: 1000 [IU] via INTRAVENOUS
  Administered 2014-09-27 (×2): 2000 [IU] via INTRAVENOUS

## 2014-09-27 MED ORDER — FENTANYL CITRATE 0.05 MG/ML IJ SOLN
25.0000 ug | INTRAMUSCULAR | Status: DC | PRN
  Administered 2014-10-07: 25 ug via INTRAVENOUS
  Filled 2014-09-27 (×2): qty 2

## 2014-09-27 MED ORDER — HEPARIN SODIUM (PORCINE) 5000 UNIT/ML IJ SOLN: 5000.0000 [IU] | Freq: Three times a day (TID) | INTRAMUSCULAR | Status: DC

## 2014-09-27 MED ORDER — CETYLPYRIDINIUM CHLORIDE 0.05 % MT LIQD
7.0000 mL | Freq: Four times a day (QID) | OROMUCOSAL | Status: DC
  Administered 2014-09-27 – 2014-10-07 (×40): 7 mL via OROMUCOSAL

## 2014-09-27 MED ORDER — PROPOFOL 10 MG/ML IV BOLUS
INTRAVENOUS | Status: AC
  Filled 2014-09-27: qty 20

## 2014-09-27 MED ORDER — DEXTROSE 5 % IV SOLN
2.0000 g | Freq: Two times a day (BID) | INTRAVENOUS | Status: DC
  Administered 2014-09-27 – 2014-10-02 (×11): 2 g via INTRAVENOUS
  Filled 2014-09-27 (×12): qty 2

## 2014-09-27 MED ORDER — PHENYLEPHRINE HCL 10 MG/ML IJ SOLN
INTRAMUSCULAR | Status: DC | PRN
  Administered 2014-09-27: 160 ug via INTRAVENOUS
  Administered 2014-09-27: 120 ug via INTRAVENOUS

## 2014-09-27 MED ORDER — ALTEPLASE 100 MG IV SOLR
10.0000 mg | INTRAVENOUS | Status: AC
  Administered 2014-09-27: 7 mg
  Filled 2014-09-27: qty 10

## 2014-09-27 MED ORDER — FENTANYL CITRATE 0.05 MG/ML IJ SOLN
INTRAMUSCULAR | Status: DC | PRN
  Administered 2014-09-27 (×2): 100 ug via INTRAVENOUS
  Administered 2014-09-27 (×3): 50 ug via INTRAVENOUS
  Administered 2014-09-27: 100 ug via INTRAVENOUS
  Administered 2014-09-27 (×2): 50 ug via INTRAVENOUS
  Administered 2014-09-27: 100 ug via INTRAVENOUS
  Administered 2014-09-27 (×2): 50 ug via INTRAVENOUS

## 2014-09-27 MED ORDER — ROCURONIUM BROMIDE 100 MG/10ML IV SOLN
INTRAVENOUS | Status: DC | PRN
  Administered 2014-09-27: 50 mg via INTRAVENOUS

## 2014-09-27 MED ORDER — LABETALOL HCL 5 MG/ML IV SOLN
20.0000 mg | INTRAVENOUS | Status: DC | PRN
  Administered 2014-09-29 – 2014-10-03 (×4): 20 mg via INTRAVENOUS
  Filled 2014-09-27 (×4): qty 4

## 2014-09-27 MED ORDER — ONDANSETRON HCL 4 MG/2ML IJ SOLN
INTRAMUSCULAR | Status: DC | PRN
  Administered 2014-09-27: 4 mg via INTRAVENOUS

## 2014-09-27 SURGICAL SUPPLY — 49 items
ADH SKN CLS LQ APL DERMABOND (GAUZE/BANDAGES/DRESSINGS) ×4
ARMBAND PINK RESTRICT EXTREMIT (MISCELLANEOUS) ×3 IMPLANT
BLADE SURG 10 STRL SS (BLADE) ×1 IMPLANT
CANISTER SUCTION 2500CC (MISCELLANEOUS) ×3 IMPLANT
CANNULA VESSEL 3MM 2 BLNT TIP (CANNULA) ×2 IMPLANT
CATH EMB 3FR 80CM (CATHETERS) ×2 IMPLANT
CATH EMB 4FR 80CM (CATHETERS) ×5 IMPLANT
CLIP TI MEDIUM 6 (CLIP) ×3 IMPLANT
CLIP TI WIDE RED SMALL 6 (CLIP) ×3 IMPLANT
COVER PROBE W GEL 5X96 (DRAPES) ×2 IMPLANT
COVER SURGICAL LIGHT HANDLE (MISCELLANEOUS) ×3 IMPLANT
DERMABOND ADHESIVE PROPEN (GAUZE/BANDAGES/DRESSINGS) ×2
DERMABOND ADVANCED .7 DNX6 (GAUZE/BANDAGES/DRESSINGS) ×2 IMPLANT
DRAPE X-RAY CASS 24X20 (DRAPES) ×2 IMPLANT
ELECT REM PT RETURN 9FT ADLT (ELECTROSURGICAL) ×3
ELECTRODE REM PT RTRN 9FT ADLT (ELECTROSURGICAL) ×2 IMPLANT
GEL ULTRASOUND 20GR AQUASONIC (MISCELLANEOUS) IMPLANT
GLOVE BIO SURGEON STRL SZ 6.5 (GLOVE) ×4 IMPLANT
GLOVE BIOGEL PI IND STRL 6.5 (GLOVE) ×3 IMPLANT
GLOVE BIOGEL PI IND STRL 7.5 (GLOVE) ×4 IMPLANT
GLOVE BIOGEL PI INDICATOR 6.5 (GLOVE) ×3
GLOVE BIOGEL PI INDICATOR 7.5 (GLOVE) ×3
GLOVE SURG SS PI 7.5 STRL IVOR (GLOVE) ×7 IMPLANT
GOWN STRL REUS W/ TWL LRG LVL3 (GOWN DISPOSABLE) ×4 IMPLANT
GOWN STRL REUS W/ TWL XL LVL3 (GOWN DISPOSABLE) ×2 IMPLANT
GOWN STRL REUS W/TWL LRG LVL3 (GOWN DISPOSABLE) ×6
GOWN STRL REUS W/TWL XL LVL3 (GOWN DISPOSABLE) ×3
HEMOSTAT SNOW SURGICEL 2X4 (HEMOSTASIS) IMPLANT
KIT BASIN OR (CUSTOM PROCEDURE TRAY) ×3 IMPLANT
KIT ROOM TURNOVER OR (KITS) ×3 IMPLANT
LIQUID BAND (GAUZE/BANDAGES/DRESSINGS) ×1 IMPLANT
LOOP VESSEL MINI RED (MISCELLANEOUS) ×2 IMPLANT
NS IRRIG 1000ML POUR BTL (IV SOLUTION) ×3 IMPLANT
PACK CV ACCESS (CUSTOM PROCEDURE TRAY) ×3 IMPLANT
PAD ARMBOARD 7.5X6 YLW CONV (MISCELLANEOUS) ×6 IMPLANT
PROBE PENCIL 8 MHZ STRL DISP (MISCELLANEOUS) ×2 IMPLANT
SET COLLECT BLD 21X3/4 12 (NEEDLE) ×2 IMPLANT
STOPCOCK 4 WAY LG BORE MALE ST (IV SETS) ×2 IMPLANT
SUT PROLENE 6 0 BV (SUTURE) ×9 IMPLANT
SUT SILK 2 0 SH (SUTURE) ×2 IMPLANT
SUT VIC AB 3-0 SH 27 (SUTURE) ×9
SUT VIC AB 3-0 SH 27X BRD (SUTURE) ×4 IMPLANT
SUT VICRYL 4-0 PS2 18IN ABS (SUTURE) ×4 IMPLANT
SYRINGE 10CC LL (SYRINGE) ×2 IMPLANT
SYRINGE 1CC SLIP TB (MISCELLANEOUS) ×2 IMPLANT
SYRINGE 20CC LL (MISCELLANEOUS) ×2 IMPLANT
TUBING EXTENTION W/L.L. (IV SETS) ×2 IMPLANT
UNDERPAD 30X30 INCONTINENT (UNDERPADS AND DIAPERS) ×3 IMPLANT
WATER STERILE IRR 1000ML POUR (IV SOLUTION) ×3 IMPLANT

## 2014-09-27 SURGICAL SUPPLY — 66 items
ADH SKN CLS LQ APL DERMABOND (GAUZE/BANDAGES/DRESSINGS) ×2
BLADE SURG ROTATE 9660 (MISCELLANEOUS) IMPLANT
CANISTER SUCTION 2500CC (MISCELLANEOUS) ×3 IMPLANT
CATH EMB 2FR 60CM (CATHETERS) ×1 IMPLANT
CATH EMB 3FR 40CM (CATHETERS) ×1 IMPLANT
CATH EMB 3FR 80CM (CATHETERS) ×1 IMPLANT
CATH EMB 4FR 80CM (CATHETERS) ×1 IMPLANT
CHLORAPREP W/TINT 26ML (MISCELLANEOUS) ×3 IMPLANT
COVER MAYO STAND STRL (DRAPES) IMPLANT
COVER SURGICAL LIGHT HANDLE (MISCELLANEOUS) ×3 IMPLANT
DERMABOND ADHESIVE PROPEN (GAUZE/BANDAGES/DRESSINGS) ×1
DERMABOND ADVANCED .7 DNX6 (GAUZE/BANDAGES/DRESSINGS) IMPLANT
DOPPLER CAUTERY SUPPRESSOR (INSTRUMENTS) ×1 IMPLANT
DRAIN CHANNEL 19F RND (DRAIN) ×1 IMPLANT
DRAPE LAPAROSCOPIC ABDOMINAL (DRAPES) ×3 IMPLANT
DRAPE PROXIMA HALF (DRAPES) IMPLANT
DRAPE UTILITY XL STRL (DRAPES) ×5 IMPLANT
DRAPE WARM FLUID 44X44 (DRAPE) ×3 IMPLANT
DRSG OPSITE POSTOP 4X10 (GAUZE/BANDAGES/DRESSINGS) IMPLANT
DRSG OPSITE POSTOP 4X8 (GAUZE/BANDAGES/DRESSINGS) IMPLANT
ELECT BLADE 6.5 EXT (BLADE) ×1 IMPLANT
ELECT CAUTERY BLADE 6.4 (BLADE) ×5 IMPLANT
ELECT REM PT RETURN 9FT ADLT (ELECTROSURGICAL) ×3
ELECTRODE REM PT RTRN 9FT ADLT (ELECTROSURGICAL) ×2 IMPLANT
EVACUATOR SILICONE 100CC (DRAIN) ×1 IMPLANT
GLOVE BIO SURGEON STRL SZ8 (GLOVE) ×3 IMPLANT
GLOVE BIOGEL PI IND STRL 7.5 (GLOVE) IMPLANT
GLOVE BIOGEL PI IND STRL 8 (GLOVE) ×2 IMPLANT
GLOVE BIOGEL PI INDICATOR 7.5 (GLOVE) ×1
GLOVE BIOGEL PI INDICATOR 8 (GLOVE) ×1
GLOVE SURG SS PI 7.5 STRL IVOR (GLOVE) ×1 IMPLANT
GOWN STRL REUS W/ TWL LRG LVL3 (GOWN DISPOSABLE) ×4 IMPLANT
GOWN STRL REUS W/ TWL XL LVL3 (GOWN DISPOSABLE) ×2 IMPLANT
GOWN STRL REUS W/TWL LRG LVL3 (GOWN DISPOSABLE) ×12
GOWN STRL REUS W/TWL XL LVL3 (GOWN DISPOSABLE) ×6
HEMOSTAT SNOW SURGICEL 2X4 (HEMOSTASIS) ×1 IMPLANT
KIT BASIN OR (CUSTOM PROCEDURE TRAY) ×4 IMPLANT
KIT ROOM TURNOVER OR (KITS) ×3 IMPLANT
LIGASURE IMPACT 36 18CM CVD LR (INSTRUMENTS) ×1 IMPLANT
LOOP VESSEL MINI RED (MISCELLANEOUS) ×1 IMPLANT
NS IRRIG 1000ML POUR BTL (IV SOLUTION) ×10 IMPLANT
PACK CV ACCESS (CUSTOM PROCEDURE TRAY) ×1 IMPLANT
PACK GENERAL/GYN (CUSTOM PROCEDURE TRAY) ×3 IMPLANT
PAD ARMBOARD 7.5X6 YLW CONV (MISCELLANEOUS) ×3 IMPLANT
PATCH VASCULAR VASCU GUARD 1X6 (Vascular Products) ×1 IMPLANT
PENCIL BUTTON HOLSTER BLD 10FT (ELECTRODE) IMPLANT
SPECIMEN JAR LARGE (MISCELLANEOUS) IMPLANT
SPONGE INTESTINAL PEANUT (DISPOSABLE) ×1 IMPLANT
SPONGE LAP 18X18 X RAY DECT (DISPOSABLE) ×3 IMPLANT
STAPLER VISISTAT 35W (STAPLE) ×3 IMPLANT
SUCTION POOLE TIP (SUCTIONS) ×3 IMPLANT
SUT ETHILON 2 0 FS 18 (SUTURE) ×1 IMPLANT
SUT PDS AB 1 TP1 96 (SUTURE) ×5 IMPLANT
SUT PROLENE 6 0 BV (SUTURE) ×8 IMPLANT
SUT SILK 2 0 SH CR/8 (SUTURE) ×6 IMPLANT
SUT SILK 2 0 TIES 10X30 (SUTURE) ×3 IMPLANT
SUT SILK 3 0 SH CR/8 (SUTURE) ×3 IMPLANT
SUT SILK 3 0 TIES 10X30 (SUTURE) ×3 IMPLANT
SUT VIC AB 3-0 SH 27 (SUTURE) ×3
SUT VIC AB 3-0 SH 27X BRD (SUTURE) IMPLANT
SUT VICRYL 4-0 PS2 18IN ABS (SUTURE) ×1 IMPLANT
SYRINGE 3CC LL L/F (MISCELLANEOUS) ×2 IMPLANT
TOWEL OR 17X26 10 PK STRL BLUE (TOWEL DISPOSABLE) ×4 IMPLANT
TRAY FOLEY CATH 16FRSI W/METER (SET/KITS/TRAYS/PACK) IMPLANT
TUBE CONNECTING 12X1/4 (SUCTIONS) IMPLANT
YANKAUER SUCT BULB TIP NO VENT (SUCTIONS) ×2 IMPLANT

## 2014-09-27 NOTE — Progress Notes (Signed)
Patient self extubated while nurse was in the room starting additional sedation medication.  Patient was wearing safety mitts at the time but was able to hook hand behind the tubing.  RT notified and in the room within 30 seconds.  AMBU bag used to ventilate the patient while prepping for reintubation.  Dr. Lake Bells notified of event and present to reintubate within a few minutes of event.  RSI kit ordered and patient reintubated.  Restraint orders placed to prevent new attempts at self extubation.  Bobette Mo

## 2014-09-27 NOTE — Progress Notes (Signed)
Based on current ABG increased fio2 to 70% due to pao2 54.

## 2014-09-27 NOTE — Op Note (Signed)
Patient name: DANYELLE BROOKOVER MRN: [id number] DOB: 03-04-1966 Sex: female  09/26/2014 - 09/27/2014 Pre-operative Diagnosis: Ischemic left hand Post-operative diagnosis:  Same Surgeon:  Eldridge Abrahams Assistants:  Leontine Locket Procedure:   #1: Thrombectomy of left brachial, radial, and ulnar arteries   #2: Patch angioplasty of left brachial and ulnar artery   #3: Patch angioplasty left radial artery Anesthesia:  Gen. Blood Loss:  See anesthesia record Specimens:  Thrombus  Findings:  What was evacuated from the brachial radial and ulnar arteries appear to be at least subacute.  It was well congealed.  The Artery Occluded on 2 Occasions after Thrombectomy.  Initially I Thought This Was Because I Did Not Heparinize Him since General Surgery Was Operating Simultaneously.  I Then Felt That It May Be Due To Lack of Outflow.  I Placed a Bovine Pericardial Patch on the Brachial Artery Extending onto the Ulnar Artery.  A Second Bovine Pericardial Patch Was Placed on the Proximal Radial Artery.  Indications:  The patient presented in transfer with pneumoperitoneum.  She also had an ischemic left hand as evidence by numbness and bluish discoloration.  The patient stated that it had been going on about 1 day  Procedure:  The patient was identified in the holding area and taken to [place]  The patient was then placed supine on the table. general anesthesia was administered.  The patient was prepped and draped in the usual sterile fashion.  A time out was called and antibiotics were administered.  I evaluated the brachial artery with ultrasound and identified the bifurcation.  This enabled me to make a longitudinal incision beginning at the antecubital crease extending distally.  I had to open the incision proximally and distally for more exposure throughout the procedure.  I initially dissected out the brachial, radial and ulnar artery and encircled them with a vessel loop.  General surgery was  simultaneously operating on her bowel.  Therefore I did not administer heparin.  A transverse arteriotomy was made in the distal brachial artery.  I initially passed a #2 Fogarty catheter down the radial and brachial and ulnar arteries.  I evacuated what appeared to be chronic/subacute thrombus.  I was able to establish excellent inflow.  I closed the arteriotomy was 2 running 6-0 Prolene sutures.  At this point after listening for while with Doppler, I was not satisfied with the signals and therefore elected to reopen the transverse arteriotomy.  I passed a larger Fogarty catheter up retrograde and the brachial artery and did get out more chronic appearing thrombus.  Excellent inflow was established.  I then had to pass Fogarty catheters down the ulnar and radial artery again.  This time there was what appeared to be intima coming back with the radial artery.  I was then unable to advance the Fogarty catheter down the radial artery.  I then closed the arteriotomy.  I had a good signal in the ulnar artery, however this time it quickly went away.  At this point time general surgery was finishing their portion of the procedure, therefore felt that the patient needed to be heparinized.  He was given systemic heparinization.  I then opened of the transverse arteriotomy in the brachial artery and extended it longitudinally going about 1 cm up the brachial artery and then going down onto the ulnar artery.  I evacuated more what appeared to be acute clot at this time.  I selected a bovine pericardial patch and performed  patch angioplasty with a running 6-0 Prolene.  Because of 2 episodes of recurrent thrombosis, I wanted to get the radial artery open.  I felt that the Fogarty catheter across the dissection.  Therefore I clamped the proximal radial artery and opened it with a #11 blade.  This was extended longitudinally.  I did see a small dissection flap which was easy to her removed.  There was excellent inflow.  I was  able to get out acute thrombus and have good backbleeding.  I then performed a second bovine pericardial patch angioplasty with 6-0 Prolene.  Blood flow was then reestablished.  The patient had a palpable radial pulse and a monophasic ulnar artery signal.  I did not see what else could be done at this time.  I elected not to shoot an arteriogram as a did not think this would change my plan.  I did not reverse the heparin.  The wound was irrigated.  Hemostasis was achieved.  The deep tissue was reapproximated with 3-0 Vicryl.  The skin was closed with 4-0 Vicryl and Dermabond.  The patient had a palpable radial pulse upon leaving the operating room.   Disposition:  To PACU in stable condition.   Theotis Burrow, M.D. [facility] Office: [phone] Pager:  [phone]

## 2014-09-27 NOTE — Progress Notes (Signed)
VASCULAR LAB PRELIMINARY  PRELIMINARY  PRELIMINARY  PRELIMINARY  Bilateral lower extremity venous Dopplers completed.    Preliminary report:  There is no DVT or SVT noted in the bilateral lower extremities.   Pearlee Arvizu, RVT 09/27/2014, 3:15 PM

## 2014-09-27 NOTE — Progress Notes (Signed)
Transported Pt to CT on ventilator for CT of chest with angiogram. Pt then transported to OR. Report given to CRNA at bedside in OR.

## 2014-09-27 NOTE — Progress Notes (Signed)
Moved et to 21 cm at the lip which is where it was prior to pt self extubating earlier.

## 2014-09-27 NOTE — Progress Notes (Signed)
Patient ID: [name], female   DOB: May 11, 1966, 48 y.o.   MRN: [id number] As expected postop, drain serosang, vascular can start heparin as needed

## 2014-09-27 NOTE — Progress Notes (Signed)
ANTIBIOTIC CONSULT NOTE - INITIAL  Pharmacy Consult for Vancocin and Maxipime Indication: rule out sepsis  Allergies  Allergen Reactions  . Ambien [Zolpidem Tartrate] Other (See Comments)    Crazy dreams  . Ceftin [Cefuroxime Axetil] Nausea And Vomiting  . Hctz [Hydrochlorothiazide] Other (See Comments)    Urinary retention  . Hydrocodone Nausea Only  . Lodine [Etodolac] Hives  . Promethazine Other (See Comments)    Hallucinations.   . Roxicodone [Oxycodone Hcl] Swelling  . Codeine Rash  . Latex Rash  . Penicillins Rash    Patient Measurements: Height: 4\' 9"  (144.8 cm) Weight: 156 lb (70.761 kg) IBW/kg (Calculated) : 38.6  Vital Signs: Temp: 100.7 F (38.2 C) (12/20 0000) Temp Source: Core (Comment) (12/20 0000) BP: 119/84 mmHg (12/20 0000) Pulse Rate: 129 (12/20 0000) Intake/Output from previous day: 12/19 0701 - 12/20 0700 In: 3550 [I.V.:2800; IV Piggyback:750] Out: 2300 [Urine:800; Blood:700] Intake/Output from this shift: Total I/O In: 3550 [I.V.:2800; IV Piggyback:750] Out: 2300 [Urine:800; Other:800; Blood:700]  Labs:  Recent Labs  09/26/14 1900  WBC 18.2*  HGB 20.3*  PLT 504*  CREATININE 1.63*   Estimated Creatinine Clearance: 33.9 mL/min (by C-G formula based on Cr of 1.63).   Microbiology: Recent Results (from the past 720 hour(s))  Blood culture (routine x 2)     Status: None (Preliminary result)   Collection Time: 09/26/14  8:56 PM  Result Value Ref Range Status   Specimen Description LEFT ANTECUBITAL  Final   Special Requests BOTTLES DRAWN AEROBIC ONLY 4CC DRAWN BY RN  Final   Culture PENDING  Incomplete   Report Status PENDING  Incomplete  Blood culture (routine x 2)     Status: None (Preliminary result)   Collection Time: 09/26/14  9:00 PM  Result Value Ref Range Status   Specimen Description LEFT ANTECUBITAL  Final   Special Requests BOTTLES DRAWN AEROBIC ONLY 4CC DRAWN BY RN  Final   Culture PENDING  Incomplete   Report Status  PENDING  Incomplete  MRSA PCR Screening     Status: None   Collection Time: 09/26/14 11:30 PM  Result Value Ref Range Status   MRSA by PCR NEGATIVE NEGATIVE Final    Comment:        The GeneXpert MRSA Assay (FDA approved for NASAL specimens only), is one component of a comprehensive MRSA colonization surveillance program. It is not intended to diagnose MRSA infection nor to guide or monitor treatment for MRSA infections.     Medical History: Past Medical History  Diagnosis Date  . Anxiety   . Depression   . COPD (chronic obstructive pulmonary disease)   . MS (multiple sclerosis)   . Fibromyalgia   . Impaired fasting glucose   . 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 Eating disorder   . Chronic low back pain   . PONV (postoperative nausea and vomiting)   . Chronic bronchitis     "yearly" (02/11/2013)  . Borderline diabetes   . GERD (gastroesophageal reflux disease)   . Migraine     "I use to" (02/11/2013)  . Arthritis     "hands & feet" (02/11/2013)  . Peripheral neuropathy     [name] 02/11/2013  . B12 deficiency anemia     [name] 02/11/2013  . Chronic pain syndrome     [name] 02/11/2013  . UTI (lower urinary tract infection) 02/11/2013    [name] 02/11/2013  . Chronic edema     BLE/notes 02/11/2013  . Ataxia   .  Peripheral neuropathy   . Muscle weakness of lower extremity     bilateral    Medications:  Prescriptions prior to admission  Medication Sig Dispense Refill Last Dose  . citalopram (CELEXA) 20 MG tablet Take 20 mg by mouth daily at 12 noon.    09/26/2014  . clonazePAM (KLONOPIN) 0.5 MG tablet Take 0.5 mg by mouth 2 (two) times daily as needed for anxiety.   09/26/2014  . cyclobenzaprine (FLEXERIL) 10 MG tablet TAKE (1) TABLET BY MOUTH AT BEDTIME. 30 tablet 2 26/71/2458  . folic acid (FOLVITE) 1 MG tablet Take 1 tablet (1 mg total) by mouth daily. 60 tablet 2 09/26/2014  . gabapentin (NEURONTIN) 300 MG capsule One in am ,one 3pm, 2qhs  (Patient taking differently: Take 300-600 mg by mouth 3 (three) times daily. One in am ,one at 3pm, and 2 qhs.) 120 capsule 6 09/26/2014  . meloxicam (MOBIC) 15 MG tablet TAKE 1 TABLET BY MOUTH DAILY. 30 tablet 4 09/26/2014  . Multiple Vitamin (MULTIVITAMIN WITH MINERALS) TABS tablet Take 1 tablet by mouth daily at 12 noon.   09/26/2014  . oxyCODONE-acetaminophen (PERCOCET) 10-325 MG per tablet Take 1 tablet by mouth every 4 (four) hours as needed for pain (no greater than 5 per day). 150 tablet 0 09/26/2014  . potassium chloride (K-DUR) 10 MEQ tablet Take 20 mEq by mouth daily at 12 noon.    09/26/2014  . thiamine (VITAMIN B-1) 100 MG tablet Take 100 mg by mouth daily.   09/26/2014  . vitamin B-12 (CYANOCOBALAMIN) 1000 MCG tablet Take 1,000 mcg by mouth daily at 12 noon.   09/26/2014  . albuterol (PROVENTIL HFA;VENTOLIN HFA) 108 (90 BASE) MCG/ACT inhaler Inhale 2 puffs into the lungs every 6 (six) hours as needed for wheezing or shortness of breath (copd).   09/24/2014  . albuterol (PROVENTIL) (2.5 MG/3ML) 0.083% nebulizer solution Take 2.5 mg by nebulization every 4 (four) hours as needed for wheezing or shortness of breath (copd).   More Than A Month  . nicotine (NICODERM CQ - DOSED IN MG/24 HOURS) 14 mg/24hr patch Place 1 patch (14 mg total) onto the skin daily. 28 patch 0 More Than A Month   Scheduled:  . antiseptic oral rinse  7 mL Mouth Rinse QID  . chlorhexidine  15 mL Mouth Rinse BID  . fentaNYL  50 mcg Intravenous Once  . heparin  5,000 Units Subcutaneous 3 times per day  . nicotine  14 mg Transdermal Daily  . pantoprazole (PROTONIX) IV  40 mg Intravenous Daily    Assessment: 48yo female c/o abdominal pain w/ N/V, denies abnormal BMs, was sent emergently to OR for suspected perforated small bowel, intraoperatively found instead large perforated ulcer, also w/ elevated SCr thought to be 2/2 underlying GI process, to begin IV ABX for pneumoperitoneum w/ resultant sepsis.  Goal of  Therapy:  Vancomycin trough level 15-20 mcg/ml  Plan:  Rec'd vanc 1g and cefepime 2g IV at [facility] ED; will continue with vancomycin 1000mg  IV Q24H and cefepime 2g IV Q24H and monitor CBC, Cx, levels prn.  Wynona Neat, PharmD, BCPS  09/27/2014,5:48 AM

## 2014-09-27 NOTE — H&P (Signed)
PULMONARY / CRITICAL CARE MEDICINE HISTORY AND PHYSICAL EXAMINATION   Name: [name] MRN: [id number] DOB: 06-Apr-1966    ADMISSION DATE:  09/26/2014  PRIMARY SERVICE: PCCM  CHIEF COMPLAINT:  Abd pain  BRIEF PATIENT DESCRIPTION: 60yof with PMH of MS, depression presents with abd pain, found to have pneumoperitoneum 2/2 small bowel rupture, also with new cold L hand.  Going to OR imminently.    SIGNIFICANT EVENTS / STUDIES:  09/26/14: Admitted, CT Abd with pneumoperitoneum, cold L hand concerning for arterial clot as well. 12/20 > ex-lap [name]) with omental patch; L brachial and ulnar artery embolectomy ([facility]ham) 12/20 LE doppler >>  LINES / TUBES: R IJ CVL 12/20 >> JP drain RUQ 12/20 >>    SUBJECTIVE: awake on vent  VITAL SIGNS: Temp:  [97.2 F (36.2 C)-101.9 F (38.8 C)] 101 F (38.3 C) (12/20 0900) Pulse Rate:  [37-141] 37 (12/20 0900) Resp:  [12-32] 23 (12/20 0900) BP: (86-267)/(30-235) 146/85 mmHg (12/20 0714) SpO2:  [84 %-100 %] 99 % (12/20 0900) Arterial Line BP: (128-167)/(69-96) 163/96 mmHg (12/20 0900) FiO2 (%):  [50 %-70 %] 70 % (12/20 0800) Weight:  [70.761 kg (156 lb)] 70.761 kg (156 lb) (12/20 0045) HEMODYNAMICS:    Vent Mode:  [-] PRVC FiO2 (%):  [50 %-70 %] 70 % Set Rate:  [16 bmp-24 bmp] 24 bmp Vt Set:  [320 mL-450 mL] 450 mL PEEP:  [5 cmH20] 5 cmH20 Plateau Pressure:  [16 cmH20-20 cmH20] 20 cmH20 INTAKE / OUTPUT: Intake/Output      12/19 0701 - 12/20 0700 12/20 0701 - 12/21 0700   I.V. (mL/kg) 2900 (41)    IV Piggyback 750    Total Intake(mL/kg) 3650 (51.6)    Urine (mL/kg/hr) 900 130 (0.5)   Emesis/NG output  125 (0.5)   Drains 50 80 (0.3)   Other 800    Blood 700    Total Output 2450 335   Net +1200 -335          PHYSICAL EXAMINATION:  Gen: awake, agitated on vent HEENT: NCAT ETT in place PULM: CTA B CV: Tachy, regular, no mgr AB: no bowel sounds, midline scar well dressed Ext: feet warm, well perfused, R hand warm, well  perfused, left hand dusky, cool Neuro: awake, follows commands  LABS:  CBC  Recent Labs Lab 09/26/14 1900 09/27/14 0600  WBC 18.2* 22.6*  HGB 20.3* 13.7  HCT 60.4* 43.8  PLT 504* 265   Coag's No results for input(s): APTT, INR in the last 168 hours. BMET  Recent Labs Lab 09/26/14 1900 09/27/14 0600  NA 138 142  K 4.8 4.5  CL 94* 110  CO2 27 20  BUN 10 9  CREATININE 1.63* 0.69  GLUCOSE 139* 140*   Electrolytes  Recent Labs Lab 09/26/14 1900 09/27/14 0600  CALCIUM 9.3 6.5*   Sepsis Markers  Recent Labs Lab 09/26/14 2056 09/26/14 2134  LATICACIDVEN 6.6* 6.69*   ABG  Recent Labs Lab 09/27/14 0559 09/27/14 0743  PHART 7.173* 7.341*  PCO2ART 57.2* 36.6  PO2ART 102.0* 59.0*   Liver Enzymes  Recent Labs Lab 09/26/14 1900  AST 16  ALT 8  ALKPHOS 109  BILITOT 0.6  ALBUMIN 2.1*   Cardiac Enzymes  Recent Labs Lab 09/26/14 1900  TROPONINI <0.30   Glucose  Recent Labs Lab 09/26/14 1852  GLUCAP 161*    Imaging Ct Abdomen Pelvis Wo Contrast  09/26/2014   CLINICAL DATA:  RIGHT upper quadrant pain that began 2 days ago. Nausea  and vomiting. Shallow breathing.  EXAM: CT ABDOMEN AND PELVIS WITHOUT CONTRAST  TECHNIQUE: Multidetector CT imaging of the abdomen and pelvis was performed following the standard protocol without IV contrast.  COMPARISON:  Portable abdominal radiograph earlier today.  FINDINGS: Pneumoperitoneum greatest in the LEFT paramedian mesentery. Generalized ascites. Thickened distal duodenum and proximal jejunal small bowel loop, possible pneumatosis. Suspect ischemic bowel with proximal jejunal perforation. No features to suggest colonic perforation due to diverticulitis, or gastroduodenal ulcer.  Mild atheromatous change of the aorta without aneurysmal dilatation or visible proximal emboli.  Grossly unremarkable appearance of the liver, spleen, pancreas, adrenal glands, and kidneys given the unenhanced appearance. No acute osseous  abnormalities. Patchy bibasilar opacities, RIGHT greater than LEFT.  IMPRESSION: Suspected ischemic proximal small bowel, with pneumoperitoneum and ascites. Findings discussed with ordering provider by myself at 8:45 p.m. 09/26/14.   Electronically Signed   By: Rolla Flatten M.D.   On: 09/26/2014 20:49   Portable Chest Xray  09/27/2014   CLINICAL DATA:  Evaluate endotracheal tube placement. Initial encounter.  EXAM: PORTABLE CHEST - 1 VIEW  COMPARISON:  Chest radiograph performed 09/26/2014  FINDINGS: The patient's endotracheal tube is seen ending 4 cm above the carina. An enteric tube is noted extending below the diaphragm.  The lungs are mildly hypoexpanded. Mild bibasilar airspace opacities may reflect atelectasis or possibly mild pneumonia, demonstrating interval redistribution since the prior study. No pleural effusion or pneumothorax is seen.  The cardiomediastinal silhouette is normal in size. A right IJ line is noted ending about the mid SVC. No acute osseous abnormalities are seen. Cervical spinal fusion hardware is partially imaged.  IMPRESSION: 1. Endotracheal tube seen ending 4 cm above the carina. 2. Right IJ line noted ending about the mid SVC. 3. Lungs mildly hypoexpanded. Mild bibasilar airspace opacities may reflect atelectasis or possibly mild pneumonia.   Electronically Signed   By: Garald Balding M.D.   On: 09/27/2014 06:50   Dg Chest Port 1 View  09/26/2014   CLINICAL DATA:  Altered mental status, abdominal pain  EXAM: PORTABLE CHEST - 1 VIEW  COMPARISON:  Chest radiograph 04/01/2014  FINDINGS: Anterior cervical fusion noted. Normal cardiac silhouette. Linear opacity at the right lung base which new from prior. Low lung volumes.  IMPRESSION: Right lower lobe atelectasis versus infiltrate.   Electronically Signed   By: Suzy Bouchard M.D.   On: 09/26/2014 20:00   Dg Abd Portable 1v  09/26/2014   CLINICAL DATA:  Altered mental status with abdominal pain.  EXAM: PORTABLE ABDOMEN - 1  VIEW  COMPARISON:  None.  FINDINGS: Suboptimal visualization due to the portable technique and body habitus. There are areas of concern for extraluminal air between the stomach bubble and splenic flexure under the LEFT hemidiaphragm, possible similar area under the RIGHT hemidiaphragm over the liver. No definite bowel obstruction. Osseous structures poorly visualized. Grossly unremarkable lung bases  IMPRESSION: Possible pneumoperitoneum. I discussed the findings with the emergency department physician at the time of interpretation. A noncontrast CT will be obtained as soon as possible for further evaluation.   Electronically Signed   By: Rolla Flatten M.D.   On: 09/26/2014 20:02    EKG: Sinus tach with occasional PAC.  No ST changes. CXR: Increased opacity in RLL, ?? atelectasis  ASSESSMENT / PLAN:  Principal Problem:   Pneumoperitoneum Active Problems:   Sepsis   Altered mental state   PULMONARY A: Acute respiratory failure with hypoxemia> uncertain etiology, PE? Has COPD but CT of bases  of lungs and CXR unimpressive for pneumonia or even emphysema P:   Empiric heparin for vascular issue and remote possibility of PE Check Echo for RV strain LE doppler legs Consider CT angio, but at this point preserving dye bolus for vascular as she may need aortogram/UE angiogram Continue full vent support duoneb prn  CARDIOVASCULAR A: Sinus tachycardia and hypertension exacerbated by post op pain/anxiety Acute brachial and ulnar clot, per vascular appeared > 24 hours old but uncertain etiology P:   Labetalol prn Tele Sedation/pain control Heparin gtt, no bolus  RENAL A: AKI with Cr of 1.63> resolved P:   Monitor BMET and UOP Replace electrolytes as needed   GASTROINTESTINAL A: Perforated gastric ulcer and pneumoperitoneum  P:   Post op care per surgery Nutrition per general surgery  HEMATOLOGIC A: No acute issues P:   Monitor for bleeding on heparin F/u LE  doppler  INFECTIOUS A: Severe Sepsis due to peritonitis P:   Blood cx: 09/26/14-->  NGTD  Vanc: 09/26/14--> Zosyn:  09/26/14-->   ENDOCRINE A: No acute issues P:     NEUROLOGIC A: Abd pain Depression Multiple sclerosis P:   RASS goal -1 Add precedx Fentanyl gtt Holding home SSRI and benzo in setting of NPO status  BEST PRACTICE / DISPOSITION Level of Care:  ICU Primary Service:  PCCM Consultants:  Vascular surgery, Trauma surgery Code Status:  Full Diet:  NPO DVT Px:  Heparin gtt GI Px:  PPI Skin Integrity:  Q2 turns Social / Family:  Boyfriend, [name], [place]: [phone] updated at bedside by fellow 12/20  TODAY'S SUMMARY: Gastric ulcer perforation, peritonitis, brachial artery clot, both dealt with surgically but now with sepsis and unexplained hypoxemia, see above  Additional CC time by me this morning 60 minutes  Roselie Awkward, MD [place] [facility] Pager: [phone] Cell: [phone] If no response, call [phone]    09/27/2014, 10:42 AM

## 2014-09-27 NOTE — Progress Notes (Signed)
Pt self extubated and is being manually ventilated at this time

## 2014-09-27 NOTE — Anesthesia Postprocedure Evaluation (Signed)
  Anesthesia Post-op Note  Patient: [name]  Procedure(s) Performed: Procedure(s): Left Radial, Brachial, and Ulnar Thrombectomy; Left Brachial to Radial Bypass Graft using Greater Saphenous vein graft from Left Thigh; Left Saphenous Vein Harvest; Intraoperative Arteriogram; Intra-arterial administration of TPA (Left)  Patient Location: SICU  Anesthesia Type:General  Level of Consciousness: sedated and Patient remains intubated per anesthesia plan  Airway and Oxygen Therapy: Patient remains intubated per anesthesia plan and Patient placed on Ventilator (see vital sign flow sheet for setting)  Post-op Pain: none  Post-op Assessment: Post-op Vital signs reviewed, Patient's Cardiovascular Status Stable, RESPIRATORY FUNCTION UNSTABLE, Patent Airway, No signs of Nausea or vomiting and Pain level controlled  Post-op Vital Signs: stable  Last Vitals:  Filed Vitals:   09/27/14 1800  BP: 96/82  Pulse: 102  Temp: 39 C  Resp: 24    Complications: No apparent anesthesia complications

## 2014-09-27 NOTE — OR Nursing (Addendum)
Patient arrived to OR with existing 16 French Latex Foley in place.  Dr. [name] aware per Bethanie Dicker RN.

## 2014-09-27 NOTE — Brief Op Note (Signed)
09/26/2014 - 09/27/2014  11:06 PM  PATIENT:  [name]  48 y.o. female  PRE-OPERATIVE DIAGNOSIS:  Ischemic Arm  POST-OPERATIVE DIAGNOSIS:  Ischemic Arm  PROCEDURE:  Procedure(s): Left Radial, Brachial, and Ulnar Thrombectomy; Left Brachial to Radial Bypass Graft using Greater Saphenous vein graft from Left Thigh; Left Saphenous Vein Harvest; Intraoperative Arteriogram; Intra-arterial administration of TPA (Left)  SURGEON:  Surgeon(s) and Role:    * [name], MD - Primary  PHYSICIAN ASSISTANT:   ASSISTANTS: Leontine Locket   ANESTHESIA:   general  EBL:  Total I/O In: 1250 [I.V.:1000; IV Piggyback:250] Out: 850 [Urine:850]  BLOOD ADMINISTERED:none  DRAINS: none   LOCAL MEDICATIONS USED:  NONE  SPECIMEN:  No Specimen  DISPOSITION OF SPECIMEN:  N/A  COUNTS:  YES  TOURNIQUET:  * No tourniquets in log *  DICTATION: .Dragon Dictation  PLAN OF CARE: Admit to inpatient   PATIENT DISPOSITION:  ICU - intubated and hemodynamically stable.   Delay start of Pharmacological VTE agent (>24hrs) due to surgical blood loss or risk of bleeding: not applicable

## 2014-09-27 NOTE — Progress Notes (Signed)
ANTICOAGULATION CONSULT NOTE - Initial Consult  Pharmacy Consult for heparin Indication: possible arterial clot and PE  Allergies  Allergen Reactions  . Ambien [Zolpidem Tartrate] Other (See Comments)    Crazy dreams  . Ceftin [Cefuroxime Axetil] Nausea And Vomiting  . Hctz [Hydrochlorothiazide] Other (See Comments)    Urinary retention  . Hydrocodone Nausea Only  . Lodine [Etodolac] Hives  . Promethazine Other (See Comments)    Hallucinations.   . Roxicodone [Oxycodone Hcl] Swelling  . Codeine Rash  . Latex Rash  . Penicillins Rash    Patient Measurements: Height: 4\' 9"  (144.8 cm) Weight: 156 lb (70.761 kg) IBW/kg (Calculated) : 38.6 Heparin Dosing Weight: 70kg  Vital Signs: Temp: 101 F (38.3 C) (12/20 0900) Temp Source: Core (Comment) (12/20 0800) BP: 146/85 mmHg (12/20 0714) Pulse Rate: 37 (12/20 0900)  Labs:  Recent Labs  09/26/14 1900 09/27/14 0600  HGB 20.3* 13.7  HCT 60.4* 43.8  PLT 504* 265  CREATININE 1.63* 0.69  TROPONINI <0.30  --     Estimated Creatinine Clearance: 69.2 mL/min (by C-G formula based on Cr of 0.69).   Medical History: Past Medical History  Diagnosis Date  . Anxiety   . Depression   . COPD (chronic obstructive pulmonary disease)   . MS (multiple sclerosis)   . Fibromyalgia   . Impaired fasting glucose   . 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 Eating disorder   . Chronic low back pain   . PONV (postoperative nausea and vomiting)   . Chronic bronchitis     "yearly" (02/11/2013)  . Borderline diabetes   . GERD (gastroesophageal reflux disease)   . Migraine     "I use to" (02/11/2013)  . Arthritis     "hands & feet" (02/11/2013)  . Peripheral neuropathy     [name] 02/11/2013  . B12 deficiency anemia     [name] 02/11/2013  . Chronic pain syndrome     [name] 02/11/2013  . UTI (lower urinary tract infection) 02/11/2013    [name] 02/11/2013  . Chronic edema     BLE/notes 02/11/2013  . Ataxia   .  Peripheral neuropathy   . Muscle weakness of lower extremity     bilateral   Assessment: 48yo female c/o abdominal pain w/ N/V, denies abnormal BMs, was sent emergently to OR for suspected perforated small bowel, intraoperatively found instead large perforated ulcer. Patient now with L hand cyanosis likely from radial arterial thrombus, low likleyhood of PE, new orders received to start IV heparin without bolus given post-op status.  D/w with Dr.McQuaid about the level of anticoagulation desired, we will need to be cautious given post-op status but with possibility of PE, he would like therapeutic anticoagulation. Will start without bolus at low rate of ~10 units/kg/hr (700 units/hr) and check level this afternoon.     Will have to follow for s/s of bleeding closely with nursing.  Goal of Therapy:  Heparin level 0.3-0.5 units/ml Monitor platelets by anticoagulation protocol: Yes   Plan:  Start heparin infusion at 700 units/hr Check anti-Xa level in 6 hours and daily while on heparin Continue to monitor H&H and platelets  Erin Hearing PharmD., BCPS Clinical Pharmacist Pager [phone] 09/27/2014 12:36 PM

## 2014-09-27 NOTE — Progress Notes (Signed)
Utilization Review Completed.[name] T12/20/2015  

## 2014-09-27 NOTE — Anesthesia Preprocedure Evaluation (Addendum)
Anesthesia Evaluation  Patient identified by MRN, date of birth, ID bandPreop documentation limited or incomplete due to emergent nature of procedure.  Airway Mallampati: Intubated       Dental   Pulmonary COPDCurrent Smoker,          Cardiovascular + Peripheral Vascular Disease Rhythm:Regular Rate:Normal     Neuro/Psych  Headaches, PSYCHIATRIC DISORDERS Anxiety Depression  Neuromuscular disease    GI/Hepatic PUD, GERD-  ,  Endo/Other    Renal/GU      Musculoskeletal  (+) Arthritis -, Fibromyalgia -, narcotic dependent  Abdominal   Peds  Hematology  (+) anemia ,   Anesthesia Other Findings   Reproductive/Obstetrics                            Anesthesia Physical Anesthesia Plan  ASA: IV and emergent  Anesthesia Plan: General   Post-op Pain Management:    Induction:   Airway Management Planned: Oral ETT  Additional Equipment:   Intra-op Plan:   Post-operative Plan: Post-operative intubation/ventilation  Informed Consent: I have reviewed the patients History and Physical, chart, labs and discussed the procedure including the risks, benefits and alternatives for the proposed anesthesia with the patient or authorized representative who has indicated his/her understanding and acceptance.     Plan Discussed with: Anesthesiologist and Surgeon  Anesthesia Plan Comments:         Anesthesia Quick Evaluation

## 2014-09-27 NOTE — H&P (Signed)
PULMONARY / CRITICAL CARE MEDICINE HISTORY AND PHYSICAL EXAMINATION   Name: KELLYN MCCARY MRN: [id number] DOB: 1965/11/29    ADMISSION DATE:  09/26/2014  PRIMARY SERVICE: PCCM  CHIEF COMPLAINT:  Abd pain  BRIEF PATIENT DESCRIPTION: 17yof with PMH of MS, depression presents with abd pain, found to have pneumoperitoneum 2/2 small bowel rupture, also with new cold L hand.  Going to OR imminently.    SIGNIFICANT EVENTS / STUDIES:  09/26/14: Admitted, CT Abd with pneumoperitoneum, cold L hand concerning for arterial clot as well.  LINES / TUBES: None   CULTURES: Blood cx: 09/26/14-->  NGTD  ANTIBIOTICS: Vanc: 09/26/14--> Zosyn:  09/26/14-->  HISTORY OF PRESENT ILLNESS:  48yof with PMH of MS, depression presents with progressively worsening abd pain x 1 days, found to have pneumoperitoneum 2/2 small bowel rupture.  She was in her usual state of health until previous 24 hrs.  During this time she has had worsening diffuse, severe sharp abd pain.  Has started vomiting over past 12 hrs as well.  +Fevers.  No recent surgeries.  Last BM yesterday.  No trauma.  Given her worsening abd pain, she ultimately presented to [facility] ED for further eval and had CT as noted below.  Has a hx of a trach/peg 2/2 prolonged ICU stay for PNA one year ago but no other abd surgeries.  While in the ED, she also developed duskiness and coolness of the L hand with noted lack of radial or ulner arterial pulses concerning for acute clot.  Vascular and trauma surg have evaluated the pt and are planning to take her to surgery imminently.   PAST MEDICAL HISTORY :  Past Medical History  Diagnosis Date  . Anxiety   . Depression   . COPD (chronic obstructive pulmonary disease)   . MS (multiple sclerosis)   . Fibromyalgia   . Impaired fasting glucose   . 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 Eating disorder   . Chronic low back pain   . PONV (postoperative nausea and vomiting)   .  Chronic bronchitis     "yearly" (02/11/2013)  . Borderline diabetes   . GERD (gastroesophageal reflux disease)   . Migraine     "I use to" (02/11/2013)  . Arthritis     "hands & feet" (02/11/2013)  . Peripheral neuropathy     [name] 02/11/2013  . B12 deficiency anemia     [name] 02/11/2013  . Chronic pain syndrome     [name] 02/11/2013  . UTI (lower urinary tract infection) 02/11/2013    [name] 02/11/2013  . Chronic edema     BLE/notes 02/11/2013  . Ataxia   . Peripheral neuropathy   . Muscle weakness of lower extremity     bilateral   Past Surgical History  Procedure Laterality Date  . Abdominal hysterectomy  ~ 1987; ~ 2004    "[place]; [place]" (02/11/2013)  . Cesarean section  9373; 1984; 1986  . Anterior cervical decomp/discectomy fusion  2009; 2011  . Tonsillectomy  1990's?  . Cataract extraction w/phaco  06/20/2011    Procedure: CATARACT EXTRACTION PHACO AND INTRAOCULAR LENS PLACEMENT (IOC);  Surgeon: [name] T. Gershon Crane;  Location: [facility];  Service: Ophthalmology;  Laterality: Left;  CDE 1.81  . Cataract extraction w/phaco  07/04/2011    Procedure: CATARACT EXTRACTION PHACO AND INTRAOCULAR LENS PLACEMENT (IOC);  Surgeon: [name] T. Gershon Crane;  Location: [facility];  Service: Ophthalmology;  Laterality: Right;  CDE: 1.76  . Yag laser application Right  12/03/2012    Procedure: YAG LASER APPLICATION;  Surgeon: [name] T. Gershon Crane, MD;  Location: [facility];  Service: Ophthalmology;  Laterality: Right;  . Yag laser application Left 10/31/4823    Procedure: YAG LASER APPLICATION;  Surgeon: [name] T. Gershon Crane, MD;  Location: [facility];  Service: Ophthalmology;  Laterality: Left;  . Appendectomy     Prior to Admission medications   Medication Sig Start Date End Date Taking? Authorizing Provider  citalopram (CELEXA) 20 MG tablet Take 20 mg by mouth daily at 12 noon.    Yes Historical Provider, MD  clonazePAM (KLONOPIN) 0.5 MG tablet Take 0.5 mg by mouth 2 (two) times daily as needed for anxiety.   Yes Historical Provider,  MD  cyclobenzaprine (FLEXERIL) 10 MG tablet TAKE (1) TABLET BY MOUTH AT BEDTIME. 07/01/14  Yes Kathyrn Drown, MD  folic acid (FOLVITE) 1 MG tablet Take 1 tablet (1 mg total) by mouth daily. 04/05/14  Yes Reyne Dumas, MD  gabapentin (NEURONTIN) 300 MG capsule One in am ,one 3pm, 2qhs Patient taking differently: Take 300-600 mg by mouth 3 (three) times daily. One in am ,one at 3pm, and 2 qhs. 06/17/14  Yes Kathyrn Drown, MD  meloxicam (MOBIC) 15 MG tablet TAKE 1 TABLET BY MOUTH DAILY. 09/14/14  Yes Kathyrn Drown, MD  Multiple Vitamin (MULTIVITAMIN WITH MINERALS) TABS tablet Take 1 tablet by mouth daily at 12 noon.   Yes Historical Provider, MD  oxyCODONE-acetaminophen (PERCOCET) 10-325 MG per tablet Take 1 tablet by mouth every 4 (four) hours as needed for pain (no greater than 5 per day). 07/23/14  Yes Kathyrn Drown, MD  potassium chloride (K-DUR) 10 MEQ tablet Take 20 mEq by mouth daily at 12 noon.    Yes Historical Provider, MD  thiamine (VITAMIN B-1) 100 MG tablet Take 100 mg by mouth daily.   Yes Historical Provider, MD  vitamin B-12 (CYANOCOBALAMIN) 1000 MCG tablet Take 1,000 mcg by mouth daily at 12 noon.   Yes Historical Provider, MD  albuterol (PROVENTIL HFA;VENTOLIN HFA) 108 (90 BASE) MCG/ACT inhaler Inhale 2 puffs into the lungs every 6 (six) hours as needed for wheezing or shortness of breath (copd).    Historical Provider, MD  albuterol (PROVENTIL) (2.5 MG/3ML) 0.083% nebulizer solution Take 2.5 mg by nebulization every 4 (four) hours as needed for wheezing or shortness of breath (copd).    Historical Provider, MD  nicotine (NICODERM CQ - DOSED IN MG/24 HOURS) 14 mg/24hr patch Place 1 patch (14 mg total) onto the skin daily. 04/05/14   Reyne Dumas, MD   Allergies  Allergen Reactions  . Ambien [Zolpidem Tartrate] Other (See Comments)    Crazy dreams  . Ceftin [Cefuroxime Axetil] Nausea And Vomiting  . Hctz [Hydrochlorothiazide] Other (See Comments)    Urinary retention  . Hydrocodone  Nausea Only  . Lodine [Etodolac] Hives  . Promethazine Other (See Comments)    Hallucinations.   . Roxicodone [Oxycodone Hcl] Swelling  . Codeine Rash  . Latex Rash  . Penicillins Rash    FAMILY HISTORY:  Family History  Problem Relation Age of Onset  . Anesthesia problems Neg Hx   . Hypotension Neg Hx   . Malignant hyperthermia Neg Hx   . Pseudochol deficiency Neg Hx   . Hyperlipidemia Mother   . Heart disease Mother   . Cancer Mother     lung  . Diabetes Father   . Heart disease Father   . Cancer Maternal Grandmother  breast   SOCIAL HISTORY:  reports that she has been smoking Cigarettes.  She has a 33 pack-year smoking history. She has quit using smokeless tobacco. She reports that she does not drink alcohol or use illicit drugs.  REVIEW OF SYSTEMS:  10 pts reviewed and neg except HPI  SUBJECTIVE: C/o Abd pain  VITAL SIGNS: Temp:  [97.5 F (36.4 C)-101.9 F (38.8 C)] 100.7 F (38.2 C) (12/20 0000) Pulse Rate:  [67-141] 129 (12/20 0000) Resp:  [12-32] 21 (12/20 0000) BP: (86-267)/(30-235) 119/84 mmHg (12/20 0000) SpO2:  [84 %-100 %] 94 % (12/20 0000) Weight:  [156 lb (70.761 kg)] 156 lb (70.761 kg) (12/20 0045) HEMODYNAMICS:      INTAKE / OUTPUT: Intake/Output      12/19 0701 - 12/20 0700   I.V. (mL/kg) 1000 (14.1)   Total Intake(mL/kg) 1000 (14.1)   Urine (mL/kg/hr) 50   Total Output 50   Net +950         PHYSICAL EXAMINATION: General:  Diaphoretic, ill appearing Neuro:  Slightly somnolent but oriented x 3, PERRL HEENT:  Supple, no LAD Cardiovascular:  Tach, reg rhythm, no murmurs Lungs:  Increased WOB, tachypneic but CTAB.  No wheezes or crackles Abdomen:  +BS, Diffusely TTP, firm Musculoskeletal:  L hand is cyanotic and w/o palpable pulse Skin:  L hand as above, other extreme ties wnl  LABS:  CBC  Recent Labs Lab 09/26/14 1900  WBC 18.2*  HGB 20.3*  HCT 60.4*  PLT 504*   Coag's No results for input(s): APTT, INR in the last 168  hours. BMET  Recent Labs Lab 09/26/14 1900  NA 138  K 4.8  CL 94*  CO2 27  BUN 10  CREATININE 1.63*  GLUCOSE 139*   Electrolytes  Recent Labs Lab 09/26/14 1900  CALCIUM 9.3   Sepsis Markers  Recent Labs Lab 09/26/14 2056 09/26/14 2134  LATICACIDVEN 6.6* 6.69*   ABG No results for input(s): PHART, PCO2ART, PO2ART in the last 168 hours. Liver Enzymes  Recent Labs Lab 09/26/14 1900  AST 16  ALT 8  ALKPHOS 109  BILITOT 0.6  ALBUMIN 2.1*   Cardiac Enzymes  Recent Labs Lab 09/26/14 1900  TROPONINI <0.30   Glucose  Recent Labs Lab 09/26/14 1852  GLUCAP 161*    Imaging Ct Abdomen Pelvis Wo Contrast  09/26/2014   CLINICAL DATA:  RIGHT upper quadrant pain that began 2 days ago. Nausea and vomiting. Shallow breathing.  EXAM: CT ABDOMEN AND PELVIS WITHOUT CONTRAST  TECHNIQUE: Multidetector CT imaging of the abdomen and pelvis was performed following the standard protocol without IV contrast.  COMPARISON:  Portable abdominal radiograph earlier today.  FINDINGS: Pneumoperitoneum greatest in the LEFT paramedian mesentery. Generalized ascites. Thickened distal duodenum and proximal jejunal small bowel loop, possible pneumatosis. Suspect ischemic bowel with proximal jejunal perforation. No features to suggest colonic perforation due to diverticulitis, or gastroduodenal ulcer.  Mild atheromatous change of the aorta without aneurysmal dilatation or visible proximal emboli.  Grossly unremarkable appearance of the liver, spleen, pancreas, adrenal glands, and kidneys given the unenhanced appearance. No acute osseous abnormalities. Patchy bibasilar opacities, RIGHT greater than LEFT.  IMPRESSION: Suspected ischemic proximal small bowel, with pneumoperitoneum and ascites. Findings discussed with ordering provider by myself at 8:45 p.m. 09/26/14.   Electronically Signed   By: Rolla Flatten M.D.   On: 09/26/2014 20:49   Dg Chest Port 1 View  09/26/2014   CLINICAL DATA:  Altered  mental status, abdominal pain  EXAM: PORTABLE CHEST -  1 VIEW  COMPARISON:  Chest radiograph 04/01/2014  FINDINGS: Anterior cervical fusion noted. Normal cardiac silhouette. Linear opacity at the right lung base which new from prior. Low lung volumes.  IMPRESSION: Right lower lobe atelectasis versus infiltrate.   Electronically Signed   By: Suzy Bouchard M.D.   On: 09/26/2014 20:00   Dg Abd Portable 1v  09/26/2014   CLINICAL DATA:  Altered mental status with abdominal pain.  EXAM: PORTABLE ABDOMEN - 1 VIEW  COMPARISON:  None.  FINDINGS: Suboptimal visualization due to the portable technique and body habitus. There are areas of concern for extraluminal air between the stomach bubble and splenic flexure under the LEFT hemidiaphragm, possible similar area under the RIGHT hemidiaphragm over the liver. No definite bowel obstruction. Osseous structures poorly visualized. Grossly unremarkable lung bases  IMPRESSION: Possible pneumoperitoneum. I discussed the findings with the emergency department physician at the time of interpretation. A noncontrast CT will be obtained as soon as possible for further evaluation.   Electronically Signed   By: Rolla Flatten M.D.   On: 09/26/2014 20:02    EKG: Sinus tach with occasional PAC.  No ST changes. CXR: Increased opacity in RLL, ?? atelectasis  ASSESSMENT / PLAN:  Principal Problem:   Pneumoperitoneum Active Problems:   Sepsis   Altered mental state   PULMONARY A: No acute issues P:   O2 prn Pt likely to remain on vent after OR case  CARDIOVASCULAR A: Tachycardia, suspect 2/2 underlying GI process P:   Fluids prn  RENAL A: AKI with Cr of 1.63, suspect 2/2 underlying GI process.  Will hold on further IVF as pt going to OR P:   Monitor UOP  GASTROINTESTINAL A: Pneumoperitoneum with resultant sepsis:  Unclear cause but may be related to prior G tube P:   Trauma surgery planning for OR repait Broad Abx with Vanc/ Zosyn Blood cx pending Pt  likely will have open abd pos op NPO for now  HEMATOLOGIC A: Elevated WBC, suspect 2/2 infection Elevated Hct, suspect 2/2 volume depletion P:   Surgery and Abx as above  INFECTIOUS A: Sepsis 2/2 small bowel perf P:   Abx as above  ENDOCRINE A: No acute issues P:     NEUROLOGIC A: Abd pain Depression P:   Fentanyl prn Holding home SSRI and benzo in setting of NPO status  MSK A: L hand cyanosis, likely 2/2 radial arterial thrombus, unclear underlying cause P: Vascular surgery to eval in [place] / DISPOSITION Level of Care:  ICU Primary Service:  PCCM Consultants:  Vascular surgery, Trauma surgery Code Status:  Full Diet:  NPO DVT Px:  Heparin GI Px:  Not indicated currently Skin Integrity:  Q2 turns Social / Family:  Boyfriend, [name], [place]: [phone] updated at bedside  TODAY'S SUMMARY: Presented with Abd pain, noted to have small bowel perf.  Also with cold L hand concerning for arterial clot.  Going to OR for abd and vascular surgery.    I have personally obtained a history, examined the patient, evaluated laboratory and imaging results, formulated the assessment and plan and placed orders.  CRITICAL CARE: The patient is critically ill with multiple organ systems failure and requires high complexity decision making for assessment and support, frequent evaluation and titration of therapies, application of advanced monitoring technologies and extensive interpretation of multiple databases. Critical Care Time devoted to patient care services described in this note is 55 minutes.   Lucrezia Starch, MD Pulmonary and Critical Care  [place] Pager: [phone]   09/27/2014, 2:10 AM

## 2014-09-27 NOTE — Procedures (Signed)
Intubation Procedure Note [name] [id number] 09-07-1966  Procedure: Intubation Indications: ETT needed to be changed  Procedure Details Consent: Unable to obtain consent because of emergent medical necessity. Time Out: Verified patient identification, verified procedure, site/side was marked, verified correct patient position, special equipment/implants available, medications/allergies/relevent history reviewed, required imaging and test results available.  Performed  Maximum sterile technique was used including gloves, gown, hand hygiene and mask.  MAC and 3    Evaluation Hemodynamic Status: BP stable throughout; O2 sats: transiently fell during during procedure Patient's Current Condition: stable Patient did tolerate procedure well. Chest X-ray ordered to verify placement.  CXR: pending.   Pt self extubated with cuff still inflated. Pt spo2 dropped to 72%. Pt manually ventilated until CCM MD  Arrived at bedside and RSI could be administered. Pt. Intubated with 7.5 ett on 1st attempt using 3 MAC secured at 23 cm at the lip. Positive color change on etco2, bilateral breath sounds, cxr pending. ETT secured using commercial tube holder and pt restrained per MD order.    [name] 09/27/2014

## 2014-09-27 NOTE — Transfer of Care (Signed)
Immediate Anesthesia Transfer of Care Note  Patient: [name]  Procedure(s) Performed: Procedure(s): Left Radial, Brachial, and Ulnar Thrombectomy; Left Brachial to Radial Bypass Graft using Greater Saphenous vein graft from Left Thigh; Left Saphenous Vein Harvest; Intraoperative Arteriogram; Intra-arterial administration of TPA (Left)  Patient Location: SICU  Anesthesia Type:General  Level of Consciousness: Patient remains intubated per anesthesia plan  Airway & Oxygen Therapy: Patient remains intubated per anesthesia plan and Patient placed on Ventilator (see vital sign flow sheet for setting)  Post-op Assessment: Report given to PACU RN and Post -op Vital signs reviewed and stable  Post vital signs: Reviewed and stable  Complications: No apparent anesthesia complications

## 2014-09-27 NOTE — Procedures (Signed)
Intubation Procedure Note [name] [id number] 1966/04/04  Procedure: Intubation Indications: Airway protection and maintenance  Procedure Details Consent: Unable to obtain consent because of emergent medical necessity. Time Out: Verified patient identification, verified procedure, site/side was marked, verified correct patient position, special equipment/implants available, medications/allergies/relevent history reviewed, required imaging and test results available.  Performed  Drugs Etomidate 20mg  Rocuronium 70mg  DL x 1 with MAC 3 blade Grade 1 view 7.5 tube passed through cords under direct visualization Placement confirmed with bilateral breath sounds, positive EtCO2 change and smoke in tube   Evaluation Hemodynamic Status: BP stable throughout; O2 sats: stable throughout Patient's Current Condition: stable Complications: No apparent complications Patient did tolerate procedure well. Chest X-ray ordered to verify placement.  CXR: pending.   MCQUAID, DOUGLAS 09/27/2014 \

## 2014-09-27 NOTE — Consult Note (Signed)
Consult Note  Patient name: [name] MRN: [id number] DOB: December 09, 1965 Sex: female  Consulting Physician:  ER  Reason for Consult:  Chief Complaint  Patient presents with  . Abdominal Pain    HISTORY OF PRESENT ILLNESS: This is a 48 year old female who presented to the [facility] emergency department earlier today with right-sided abdominal pain for 2 days.  She underwent a noncontrasted CT scan which showed free air and proximal jejunal perforation.  She was noted to have discoloration in her left hand while at the emergency department.  She states that her hand that felt different or weird for about 1 day.  She does have a history of neuropathy in that hand.  This was clearly different.  The patient suffers from COPD and has a current smoker.  She also suffers from anxiety, and mass, fibromyalgia and chronic low back pain.  She is also treated for arthritis and reflux disease.  She has a history of tracheostomy and PEG tube placement 1 year ago at [facility].  Past Medical History  Diagnosis Date  . Anxiety   . Depression   . COPD (chronic obstructive pulmonary disease)   . MS (multiple sclerosis)   . Fibromyalgia   . Impaired fasting glucose   . 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 Eating disorder   . Chronic low back pain   . PONV (postoperative nausea and vomiting)   . Chronic bronchitis     "yearly" (02/11/2013)  . Borderline diabetes   . GERD (gastroesophageal reflux disease)   . Migraine     "I use to" (02/11/2013)  . Arthritis     "hands & feet" (02/11/2013)  . Peripheral neuropathy     [name] 02/11/2013  . B12 deficiency anemia     [name] 02/11/2013  . Chronic pain syndrome     [name] 02/11/2013  . UTI (lower urinary tract infection) 02/11/2013    [name] 02/11/2013  . Chronic edema     BLE/notes 02/11/2013  . Ataxia   . Peripheral neuropathy   . Muscle weakness of lower extremity     bilateral    Past Surgical History    Procedure Laterality Date  . Abdominal hysterectomy  ~ 1987; ~ 2004    "woodward; ferguson" (02/11/2013)  . Cesarean section  6759; 1984; 1986  . Anterior cervical decomp/discectomy fusion  2009; 2011  . Tonsillectomy  1990's?  . Cataract extraction w/phaco  06/20/2011    Procedure: CATARACT EXTRACTION PHACO AND INTRAOCULAR LENS PLACEMENT (IOC);  Surgeon: [name] T. Gershon Crane;  Location: [facility];  Service: Ophthalmology;  Laterality: Left;  CDE 1.81  . Cataract extraction w/phaco  07/04/2011    Procedure: CATARACT EXTRACTION PHACO AND INTRAOCULAR LENS PLACEMENT (IOC);  Surgeon: [name] T. Gershon Crane;  Location: [facility];  Service: Ophthalmology;  Laterality: Right;  CDE: 1.76  . Yag laser application Right 1/63/8466    Procedure: YAG LASER APPLICATION;  Surgeon: [name] T. Gershon Crane, MD;  Location: [facility];  Service: Ophthalmology;  Laterality: Right;  . Yag laser application Left 5/99/3570    Procedure: YAG LASER APPLICATION;  Surgeon: [name] T. Gershon Crane, MD;  Location: [facility];  Service: Ophthalmology;  Laterality: Left;  . Appendectomy      History   Social History  . Marital Status: Divorced    Spouse Name: N/A    Number of Children: N/A  . Years of Education: N/A   Occupational History  . Not  on file.   Social History Main Topics  . Smoking status: Current Every Day Smoker -- 1.00 packs/day for 33 years    Types: Cigarettes  . Smokeless tobacco: Former Systems developer     Comment: 02/11/2013 offered smoking cessation materials; pt declines  . Alcohol Use: No  . Drug Use: No  . Sexual Activity: Not Currently    Birth Control/ Protection: Surgical   Other Topics Concern  . Not on file   Social History Narrative    Family History  Problem Relation Age of Onset  . Anesthesia problems Neg Hx   . Hypotension Neg Hx   . Malignant hyperthermia Neg Hx   . Pseudochol deficiency Neg Hx   . Hyperlipidemia Mother   . Heart disease Mother   . Cancer Mother     lung  . Diabetes Father   . Heart disease Father    . Cancer Maternal Grandmother     breast    Allergies as of 09/26/2014 - Review Complete 09/26/2014  Allergen Reaction Noted  . Ambien [zolpidem tartrate] Other (See Comments) 02/17/2013  . Ceftin [cefuroxime axetil] Nausea And Vomiting 12/30/2012  . Hctz [hydrochlorothiazide] Other (See Comments) 12/30/2012  . Hydrocodone Nausea Only 02/08/2013  . Lodine [etodolac] Hives 12/30/2012  . Promethazine Other (See Comments) 02/11/2013  . Roxicodone [oxycodone hcl] Swelling 01/13/2013  . Codeine Rash 06/16/2011  . Latex Rash 07/24/2011  . Penicillins Rash 06/16/2011    No current facility-administered medications on file prior to encounter.   Current Outpatient Prescriptions on File Prior to Encounter  Medication Sig Dispense Refill  . citalopram (CELEXA) 20 MG tablet Take 20 mg by mouth daily at 12 noon.     . clonazePAM (KLONOPIN) 0.5 MG tablet Take 0.5 mg by mouth 2 (two) times daily as needed for anxiety.    . cyclobenzaprine (FLEXERIL) 10 MG tablet TAKE (1) TABLET BY MOUTH AT BEDTIME. 30 tablet 2  . folic acid (FOLVITE) 1 MG tablet Take 1 tablet (1 mg total) by mouth daily. 60 tablet 2  . gabapentin (NEURONTIN) 300 MG capsule One in am ,one 3pm, 2qhs (Patient taking differently: Take 300-600 mg by mouth 3 (three) times daily. One in am ,one at 3pm, and 2 qhs.) 120 capsule 6  . meloxicam (MOBIC) 15 MG tablet TAKE 1 TABLET BY MOUTH DAILY. 30 tablet 4  . Multiple Vitamin (MULTIVITAMIN WITH MINERALS) TABS tablet Take 1 tablet by mouth daily at 12 noon.    Marland Kitchen oxyCODONE-acetaminophen (PERCOCET) 10-325 MG per tablet Take 1 tablet by mouth every 4 (four) hours as needed for pain (no greater than 5 per day). 150 tablet 0  . potassium chloride (K-DUR) 10 MEQ tablet Take 20 mEq by mouth daily at 12 noon.     . thiamine (VITAMIN B-1) 100 MG tablet Take 100 mg by mouth daily.    . vitamin B-12 (CYANOCOBALAMIN) 1000 MCG tablet Take 1,000 mcg by mouth daily at 12 noon.    Marland Kitchen albuterol (PROVENTIL  HFA;VENTOLIN HFA) 108 (90 BASE) MCG/ACT inhaler Inhale 2 puffs into the lungs every 6 (six) hours as needed for wheezing or shortness of breath (copd).    Marland Kitchen albuterol (PROVENTIL) (2.5 MG/3ML) 0.083% nebulizer solution Take 2.5 mg by nebulization every 4 (four) hours as needed for wheezing or shortness of breath (copd).    . nicotine (NICODERM CQ - DOSED IN MG/24 HOURS) 14 mg/24hr patch Place 1 patch (14 mg total) onto the skin daily. 28 patch 0  REVIEW OF SYSTEMS: Cardiovascular: No chest pain, chest pressure, palpitations, orthopnea, or dyspnea on exertion. No claudication or rest pain,  No history of DVT or phlebitis. Pulmonary: No productive cough, asthma or wheezing. Neurologic: No weakness, paresthesias, aphasia, or amaurosis. No dizziness.  No left hand Hematologic: No bleeding problems or clotting disorders. Musculoskeletal: No joint pain or joint swelling. Gastrointestinal: No blood in stool or hematemesis, abd pain Genitourinary: No dysuria or hematuria. Psychiatric:: No history of major depression. Integumentary: No rashes or ulcers. Constitutional: No fever or chills.  PHYSICAL EXAMINATION: General: The patient appears their stated age.  Vital signs are BP 119/84 mmHg  Pulse 129  Temp(Src) 100.7 F (38.2 C) (Other (Comment))  Resp 21  Ht 4\' 9"  (1.448 m)  Wt 156 lb (70.761 kg)  BMI 33.75 kg/m2  SpO2 94% Pulmonary: Respirations are non-labored HEENT:  No gross abnormalities Abdomen: Soft and non-tender  Musculoskeletal: There are no major deformities.   Neurologic: No focal weakness or paresthesias are detected, Skin: There are no ulcer or rashes noted. Psychiatric: The patient has normal affect. Cardiovascular: There is a regular rate and rhythm without significant murmur appreciated.  Mild left hand.  No left radial or ulnar Doppler signals.  There is a brisk radial signal.  Good dorsalis pedis Doppler signals  Diagnostic Studies: CT scan shows pneumoperitoneum  with free fluid.  No significant calcification at the origin of the celiac or superior mesenteric artery.    Assessment:  Pneumoperitoneum Ischemic left hand Plan: I discussed with the patient and her husband that she needs to go the operating room for exploration of her left hand simultaneously as she undergoes abdominal exploration.  I discussed that would attempt thrombectomy of the radial and ulnar artery on the left.  I'll also be available should a mesenteric thrombus be Identified.  This is a limb threatening situation.  Source of possible thrombus is unknown     V. Leia Alf, M.D. [facility] Office: [phone] Pager:  [phone]

## 2014-09-27 NOTE — Progress Notes (Signed)
While performing vascular assessment and pulse checks of patient it was noted that all pulses became completely absent in the left upper extremity.  Two other nurses attempted to doppler pulses and were not successful.  Also,left hand becoming increasingly mottled and dusky blue in color.  Fingertips on the right hand also starting to have a bluish hue but pulses still palpable.  Dr. [name] notified and made aware.  CT angiogram scheduled for patient and consent obtained for left hand thrombectomy.  Bobette Mo

## 2014-09-27 NOTE — Op Note (Signed)
Patient name: [name] MRN: [id number] DOB: 1966-09-16 Sex: female  09/26/2014 - 09/27/2014 Pre-operative Diagnosis: Ischemic Left arm Post-operative diagnosis:  Same Surgeon:  Eldridge Abrahams Assistants:  Leontine Locket Procedure:   1.  Redo Left brachial, ulnar, and radial exposure   2.  Thrombectomy Left brachial, ulnar, adn radial artery   3. Angiogram, left upper extremity   4.  Left brachial to left radial artery bypass with left leg non-reversed greater saphenous vein   5.  Intra-arterial injection of TPA Anesthesia:  Gen. 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  Angiography revealed a diseased midportion of the radial artery which led me to proceed with brachial to radial bypass  Indications:  The patient had 3 recent undergone extensive attempt at revascularization of her left hand.  She was doing well with good Doppler signals however these disappeared and her hand became mottled and blue.  I got a CT angiogram to try to explain why this had occluded again.  She did appear to have residual filling defect in the proximal subclavian artery.  No other obvious source was identified.  The patient was taken back to the operating room under emergent consent as the patient was intubated and no immediate family was available.  Procedure:  The patient was identified in the holding area and taken to [place] [address]  The patient was then placed supine on the table. general anesthesia was administered.  The patient was prepped and draped in the usual sterile fashion.  A time out was called and antibiotics were administered.  The patient's previous incision was opened with a 10 blade.  I quickly exposed the brachial ulnar and radial artery.  There was excellent pulse in the brachial artery, however minimal Doppler signals within the radial and ulnar artery.  I opened the patch in the brachial artery with a #11  blade.  There was good inflow but minimal back bleeding.  The patient was bolused with heparin and a continuous infusion was continued.  I advanced a #4 Fogarty catheter centrally until I felt like I was in the aorta.  A thrombectomy was performed which evacuated additional well formed thrombus which did not appear acute.  I did this until I had a negative past and excellent inflow.  I then passed a #3 Fogarty catheter down the radial and ulnar artery until all clot was evacuated.  I closed the patch with running 6-0 Prolene.  I then performed an angiogram of the left hand.  This showed occlusion of the ulnar artery and a very diseased radial artery in its midportion.  At this time, the radial artery was patent and did opacify digital arteries on the hand.  At this point the patient also had a palpable pulse however with time and observation this went away.  I felt at this time the only chance for hand salvage was going to be a bypass around to the distal radial artery.  I evaluated the saphenous vein in the left groin was ultrasounded appear to be adequate.  Skip incisions were made in the left groin and the saphenous vein was harvested by ligating side branches with silk ties and metal clips.  Once I had adequate vein it was ligated proximally and distally.  The vein was then prepared on the back table.  It distended nicely to 3. 5-4 millimeters.  It was marked with an ink  pen for orientation.  I then created a tunnel between the brachial artery incision and the incision used to expose the radial artery.  I occluded the brachial artery proximally and distally.  I made an arteriotomy in a longitudinal fashion proximal to where I had completed the patch repair previously.  There was excellent inflow at this level.  The vein was placed in a non-reversed fashion and spatulated to fit the size of the arteriotomy.  A running anastomosis was created with 6-0 Prolene.  The anastomosis was completed.  I used a valvulotome  to lyse the valves.  There was excellent pulsatile flow through the graft.  The vein graft was then brought through the previously created tunnel.  I then occluded the radial artery proximally and distally.  A #11 blade was used to make an arteriotomy which was extended longitudinally with Potts scissors.  The artery was disease-free and about 2.5 mm.  I was able to advance a #2 Fogarty about 20 mm and no clot was evacuated.  I then injected 7 mg of TPA into the radial artery.  The vein graft was then cut to the appropriate length and spatulated.  A end-to-side anastomosis was created with running 6-0 Prolene.  Prior to completion, the appropriate flushing maneuvers were performed and the anastomosis was completed.  At this point the patient had an excellent palpable radial pulse as well as a graft pulse.  I was able to hear a faint palmar arch Doppler signal.  I did not see any other options for revascularization and therefore elected to terminate the procedure.  I did not reverse the heparin.  The vein harvest incisions were closed with 2 layers of 3-0 Vicryl.  The arm incisions were closed with a layer of 30 followed by a layer of 4-0 Vicryl.  All incisions were covered with Dermabond.  The patient had a palpable graft and radial pulse after the procedure.  She was taken back to the ICU intubated in stable condition   Disposition:  To PACU in stable condition.   Theotis Burrow, M.D. [facility] [place] [facility] Office: [phone] Pager:  [phone]

## 2014-09-27 NOTE — Transfer of Care (Signed)
Immediate Anesthesia Transfer of Care Note  Patient: [name]  Procedure(s) Performed: Procedure(s): Exploratory Laparotomy, Biopsy of Perforated Gastric Ulcer, Closure with omental patch (N/A) Left Brachial, Radial,Ulnar Embolectomy with patch angioplasty left brachial, radial and ulnar artery (Left)  Patient Location: SICU  Anesthesia Type:General  Level of Consciousness: sedated and Patient remains intubated per anesthesia plan  Airway & Oxygen Therapy: Patient remains intubated per anesthesia plan and Patient placed on Ventilator (see vital sign flow sheet for setting)  Post-op Assessment: Report given to PACU RN and Post -op Vital signs reviewed and stable  Post vital signs: Reviewed and stable  Complications: No apparent anesthesia complications

## 2014-09-27 NOTE — Anesthesia Postprocedure Evaluation (Signed)
  Anesthesia Post-op Note  Patient: [name]  Procedure(s) Performed: Procedure(s): Exploratory Laparotomy, Biopsy of Perforated Gastric Ulcer, Closure with omental patch (N/A) Left Brachial, Radial,Ulnar Embolectomy with patch angioplasty left brachial, radial and ulnar artery (Left)  Patient Location: SICU  Anesthesia Type:General  Level of Consciousness: sedated and Patient remains intubated per anesthesia plan  Airway and Oxygen Therapy: Patient remains intubated per anesthesia plan and Patient placed on Ventilator (see vital sign flow sheet for setting)  Post-op Pain: none  Post-op Assessment: Post-op Vital signs reviewed, Patient's Cardiovascular Status Stable, Respiratory Function Stable and No signs of Nausea or vomiting  Post-op Vital Signs: Reviewed and stable  Last Vitals:  Filed Vitals:   09/27/14 0000  BP: 119/84  Pulse: 129  Temp: 38.2 C  Resp: 21    Complications: No apparent anesthesia complications

## 2014-09-27 NOTE — Anesthesia Procedure Notes (Addendum)
Procedure Name: Intubation Date/Time: 09/27/2014 1:08 AM Performed by: Jacquiline Doe A Pre-anesthesia Checklist: Patient identified, Timeout performed, Emergency Drugs available, Suction available and Patient being monitored Patient Re-evaluated:Patient Re-evaluated prior to inductionOxygen Delivery Method: Circle system utilized Preoxygenation: Pre-oxygenation with 100% oxygen Intubation Type: IV induction, Rapid sequence and Cricoid Pressure applied Laryngoscope Size: Mac and 3 Grade View: Grade I Tube type: Subglottic suction tube Tube size: 7.0 mm Number of attempts: 2 Airway Equipment and Method: Stylet Placement Confirmation: ETT inserted through vocal cords under direct vision,  breath sounds checked- equal and bilateral and positive ETCO2 Secured at: 21 cm Tube secured with: Tape Dental Injury: Teeth and Oropharynx as per pre-operative assessment       The patient was identified and consent obtained.  TO was performed, and full barrier precautions were used.  The skin was anesthetized with lidocaine.  Once the vein was located with the 22 ga., the wire was inserted into the vein. The insertion site was dilated and the CVL was carefully inserted and sutured in place. The patient tolerated the procedure well.  CXR was ordered for PACU.Ultrasound guidance: relevant anatomy identified, needle position confirmed, vessel patent, wire seen inside the vessel.  Images printed for the medical record.  Start: 0112 End: 0121 J. Tedra Senegal, MD  The patient was identified and consent was given.  Hand hygiene and sterile gloves were used.  Following the time-out, the brachial  region was prepped and draped in a sterile fashion. The skin was anesthetized with local anesthesia.  The artery was located and a wire was inserted.  The skin was dilated and a 20cm, 20ga catheter was inserted.  The placement was confirmed by arterial flow.  The catheter was secured with a sterile dressing and sutured  in place. The patient tolerated the procedure well. Start: 0113 End: 0121 J. Tedra Senegal, MD

## 2014-09-27 NOTE — Anesthesia Preprocedure Evaluation (Addendum)
Anesthesia Evaluation  Patient identified by MRN, date of birth, ID band Patient awake    Reviewed: Allergy & Precautions, NPO status , Patient's Chart, lab work & pertinent test results  History of Anesthesia Complications (+) PONV and history of anesthetic complications  Airway Mallampati: II  TM Distance: >3 FB Neck ROM: Full  Mouth opening: Limited Mouth Opening  Dental  (+) Edentulous Upper, Poor Dentition, Dental Advisory Given   Pulmonary COPDCurrent Smoker,    Pulmonary exam normal       Cardiovascular negative cardio ROS   Echo Study Conclusions  - Left ventricle: The cavity size was normal. Systolic function was normal. The estimated ejection fraction was in the range of 60% to 65%. Wall motion was normal; there were no regional wall motion abnormalities.    Neuro/Psych  Headaches, PSYCHIATRIC DISORDERS Anxiety Depression Multiple Sclerosis    GI/Hepatic Neg liver ROS, GERD-  ,  Endo/Other  negative endocrine ROS  Renal/GU negative Renal ROS     Musculoskeletal   Abdominal   Peds  Hematology   Anesthesia Other Findings   Reproductive/Obstetrics                          Anesthesia Physical Anesthesia Plan  ASA: III and emergent  Anesthesia Plan: General   Post-op Pain Management:    Induction: Intravenous, Rapid sequence and Cricoid pressure planned  Airway Management Planned: Oral ETT  Additional Equipment: Arterial line and CVP  Intra-op Plan:   Post-operative Plan: Post-operative intubation/ventilation  Informed Consent: I have reviewed the patients History and Physical, chart, labs and discussed the procedure including the risks, benefits and alternatives for the proposed anesthesia with the patient or authorized representative who has indicated his/her understanding and acceptance.   Dental advisory given  Plan Discussed with: CRNA, Anesthesiologist and  Surgeon  Anesthesia Plan Comments:        Anesthesia Quick Evaluation

## 2014-09-27 NOTE — Op Note (Signed)
09/26/2014 - 09/27/2014  3:16 AM  PATIENT:  [name]  48 y.o. female  PRE-OPERATIVE DIAGNOSIS:  perforated small bowel  POST-OPERATIVE DIAGNOSIS:  perforated prepyloric gastric ulcer  PROCEDURE:  Procedure(s): Exploratory Laparotomy, Biopsy of Perforated Gastric Ulcer, Closure with omental patch  SURGEON:  [name], M.D.  ASSISTANTS: Jackolyn Confer, M.D.  ANESTHESIA:   general  EBL:  Total I/O In: 2500 [I.V.:2000; IV Piggyback:500] Out: 650 [Urine:450; Blood:200]  BLOOD ADMINISTERED:none  DRAINS: (1) Jackson-Pratt drain(s) with closed bulb suction in the RUQ   SPECIMEN:  Peritoneal cultures, gastric ulcer biopsies  DISPOSITION OF SPECIMEN:  PATHOLOGY  COUNTS:  YES  DICTATION: .Dragon Dictation patient is brought for emergency exploratory laparotomy for free intraperitoneal air and suspected perforated proximal small bowel. She was brought directly from the intensive care unit. Informed consent had been obtained. She received intravenous antibiotics. General endotracheal anesthesia was administered by the anesthesia staff. Abdomen was prepped and draped in sterile fashion. Time out procedure was done. Midline incision was made in was continued to below the umbilicus. Subcutaneous stitches were dissected down revealing the anterior fascia. This was divided sharply along the midline. Peritoneal cavity was entered under direct vision. There was copious tan fluid in the abdomen without odor. This was sent for culture. Adhesions of the omentum were taken down from the lower midline carefully using cautery. Sutures were placed on the omentum getting good hemostasis. Small bowel had scattered copious premix exudate present. Proximal jejunum at the ligament of Treitz was inspected and was stained with bile but had no perforation there. We then found a hole in the transverse colon mesentery through which a gastric ulcer had perforated. Of note, the stomach was free from the  anterior abdominal wall despite previous PEG tube. There was a dense nodule in the transverse colon mesentery which was excised using LigaSure and sent for pathology. Next, the lesser sac was entered and opened further with LigaSure. This exposed prepyloric gastric ulcer with large perforation. Medial lateral walls were excised and sent for biopsy. Hemostasis was obtained. The ulcer was then closed with interrupted 2-0 silk sutures. We did achieve good closure. A tongue of omentum was then fashioned and sutured down over the closure with interrupted 2-0 silk sutures. Abdomen was copiously irrigated with multiple liters of warm saline. Bowel was returned to anatomic position. Defect in the transverse colon mesentery was closed with interrupted 2-0 silk. [place] drain was placed in the right upper quadrant extending dependently beneath the repair. This was secured with nylon suture. Hemostasis was ensured. Nasogastric tube was positioned. Fascia was then closed with running #1 looped PDS tied in the middle. Skin was left open and packed with wet-to-dry dressing. All counts were correct. Patient remained in the operating room with Dr. [name] completing embolectomy left upper extremity.  PATIENT DISPOSITION:  ICU - intubated and critically ill.   Delay start of Pharmacological VTE agent (>24hrs) due to surgical blood loss or risk of bleeding:  no  [name], MD, MPH, FACS Pager: [phone]  12/20/20153:16 AM

## 2014-09-27 NOTE — OR Nursing (Signed)
Addendum made to patient surgical skin prep to reflect prep performed for embolectomy procedure which occurred simultaneously.

## 2014-09-28 ENCOUNTER — Encounter (HOSPITAL_COMMUNITY): Payer: Self-pay | Admitting: General Surgery

## 2014-09-28 ENCOUNTER — Inpatient Hospital Stay (HOSPITAL_COMMUNITY): Payer: BLUE CROSS/BLUE SHIELD

## 2014-09-28 DIAGNOSIS — I6789 Other cerebrovascular disease: Secondary | ICD-10-CM

## 2014-09-28 DIAGNOSIS — I742 Embolism and thrombosis of arteries of the upper extremities: Secondary | ICD-10-CM

## 2014-09-28 DIAGNOSIS — K251 Acute gastric ulcer with perforation: Secondary | ICD-10-CM

## 2014-09-28 DIAGNOSIS — J9601 Acute respiratory failure with hypoxia: Secondary | ICD-10-CM

## 2014-09-28 LAB — CBC WITH DIFFERENTIAL/PLATELET
BASOS ABS: 0 10*3/uL (ref 0.0–0.1)
Basophils Relative: 0 % (ref 0–1)
EOS PCT: 0 % (ref 0–5)
Eosinophils Absolute: 0 10*3/uL (ref 0.0–0.7)
HCT: 37.9 % (ref 36.0–46.0)
Hemoglobin: 12.4 g/dL (ref 12.0–15.0)
LYMPHS ABS: 1.6 10*3/uL (ref 0.7–4.0)
Lymphocytes Relative: 10 % — ABNORMAL LOW (ref 12–46)
MCH: 32.5 pg (ref 26.0–34.0)
MCHC: 32.7 g/dL (ref 30.0–36.0)
MCV: 99.5 fL (ref 78.0–100.0)
MONOS PCT: 3 % (ref 3–12)
Monocytes Absolute: 0.5 10*3/uL (ref 0.1–1.0)
Neutro Abs: 13.4 10*3/uL — ABNORMAL HIGH (ref 1.7–7.7)
Neutrophils Relative %: 87 % — ABNORMAL HIGH (ref 43–77)
Platelets: 211 10*3/uL (ref 150–400)
RBC: 3.81 MIL/uL — ABNORMAL LOW (ref 3.87–5.11)
RDW: 12.9 % (ref 11.5–15.5)
WBC: 15.5 10*3/uL — AB (ref 4.0–10.5)

## 2014-09-28 LAB — POCT I-STAT 7, (LYTES, BLD GAS, ICA,H+H)
Acid-base deficit: 2 mmol/L (ref 0.0–2.0)
Acid-base deficit: 8 mmol/L — ABNORMAL HIGH (ref 0.0–2.0)
BICARBONATE: 17.5 meq/L — AB (ref 20.0–24.0)
Bicarbonate: 23.1 mEq/L (ref 20.0–24.0)
Calcium, Ion: 0.95 mmol/L — ABNORMAL LOW (ref 1.12–1.23)
Calcium, Ion: 0.91 mmol/L — ABNORMAL LOW (ref 1.12–1.23)
HCT: 50 % — ABNORMAL HIGH (ref 36.0–46.0)
HCT: 41 % (ref 36.0–46.0)
Hemoglobin: 17 g/dL — ABNORMAL HIGH (ref 12.0–15.0)
Hemoglobin: 13.9 g/dL (ref 12.0–15.0)
O2 SAT: 99 %
O2 SAT: 100 %
PH ART: 7.375 (ref 7.350–7.450)
PO2 ART: 262 mmHg — AB (ref 80.0–100.0)
POTASSIUM: 5.7 meq/L — AB (ref 3.7–5.3)
Patient temperature: 37.5
Patient temperature: 36
Potassium: 4.6 mEq/L (ref 3.7–5.3)
SODIUM: 140 meq/L (ref 137–147)
Sodium: 138 mEq/L (ref 137–147)
TCO2: 24 mmol/L (ref 0–100)
TCO2: 19 mmol/L (ref 0–100)
pCO2 arterial: 39.8 mmHg (ref 35.0–45.0)
pCO2 arterial: 33.6 mmHg — ABNORMAL LOW (ref 35.0–45.0)
pH, Arterial: 7.321 — ABNORMAL LOW (ref 7.350–7.450)
pO2, Arterial: 134 mmHg — ABNORMAL HIGH (ref 80.0–100.0)

## 2014-09-28 LAB — BASIC METABOLIC PANEL
Anion gap: 14 (ref 5–15)
BUN: 11 mg/dL (ref 6–23)
CO2: 18 mEq/L — ABNORMAL LOW (ref 19–32)
Calcium: 8.1 mg/dL — ABNORMAL LOW (ref 8.4–10.5)
Chloride: 108 mEq/L (ref 96–112)
Creatinine, Ser: 0.76 mg/dL (ref 0.50–1.10)
GFR calc Af Amer: >90 mL/min (ref >90)
Glucose, Bld: 128 mg/dL — ABNORMAL HIGH (ref 70–99)
Potassium: 4.4 mEq/L (ref 3.7–5.3)
SODIUM: 140 meq/L (ref 137–147)

## 2014-09-28 LAB — HEPARIN LEVEL (UNFRACTIONATED)
Heparin Unfractionated: 0.14 IU/mL — ABNORMAL LOW (ref 0.30–0.70)
Heparin Unfractionated: 0.38 IU/mL (ref 0.30–0.70)

## 2014-09-28 MED ORDER — ACETAMINOPHEN 160 MG/5ML PO SOLN
650.0000 mg | ORAL | Status: DC | PRN
  Administered 2014-09-29 – 2014-10-08 (×6): 650 mg via ORAL
  Filled 2014-09-28 (×6): qty 20.3

## 2014-09-28 MED ORDER — SODIUM CHLORIDE 0.9 % IV SOLN
1.0000 mg/h | INTRAVENOUS | Status: DC
  Administered 2014-09-28: 6 mg/h via INTRAVENOUS
  Administered 2014-09-28: 2 mg/h via INTRAVENOUS
  Administered 2014-09-28: 4 mg/h via INTRAVENOUS
  Administered 2014-09-29: 8 mg/h via INTRAVENOUS
  Administered 2014-09-29 – 2014-10-01 (×3): 3 mg/h via INTRAVENOUS
  Administered 2014-10-01 – 2014-10-02 (×2): 4 mg/h via INTRAVENOUS
  Administered 2014-10-03 (×2): 1 mg/h via INTRAVENOUS
  Administered 2014-10-04: 4 mg/h via INTRAVENOUS
  Administered 2014-10-05: 2 mg/h via INTRAVENOUS
  Filled 2014-09-28 (×12): qty 10

## 2014-09-28 NOTE — Progress Notes (Signed)
1 Day Post-Op  Subjective: PT ON VENT  OPENS EYES WHEN STIMULATED  Objective: Vital signs in last 24 hours: Temp:  [100.2 F (37.9 C)-102.4 F (39.1 C)] 100.3 F (37.9 C) (12/21 0716) Pulse Rate:  [30-136] 88 (12/21 0713) Resp:  [20-25] 21 (12/21 0713) BP: (96-152)/(48-82) 132/58 mmHg (12/21 0713) SpO2:  [71 %-100 %] 94 % (12/21 0713) Arterial Line BP: (74-163)/(51-96) 88/53 mmHg (12/20 1600) FiO2 (%):  [60 %-100 %] 60 % (12/21 0713) Weight:  [159 lb 13.3 oz (72.5 kg)] 159 lb 13.3 oz (72.5 kg) (12/21 0500)    Intake/Output from previous day: 12/20 0701 - 12/21 0700 In: 4727.3 [I.V.:3727.3; IV Piggyback:1000] Out: 2360 [Urine:1825; Emesis/NG output:375; Drains:160] Intake/Output this shift:    Incision/Wound:OPEN AND CLEAN.  JP SEROUS NGT BILIOUS  Lab Results:   Recent Labs  09/27/14 0600 09/28/14 0342  WBC 22.6* 15.5*  HGB 13.7 12.4  HCT 43.8 37.9  PLT 265 211   BMET  Recent Labs  09/27/14 0600 09/28/14 0342  NA 142 140  K 4.5 4.4  CL 110 108  CO2 20 18*  GLUCOSE 140* 128*  BUN 9 11  CREATININE 0.69 0.76  CALCIUM 6.5* 8.1*   PT/INR No results for input(s): LABPROT, INR in the last 72 hours. ABG  Recent Labs  09/27/14 0559 09/27/14 0743  PHART 7.173* 7.341*  HCO3 20.5 19.6*    Studies/Results: Ct Abdomen Pelvis Wo Contrast  09/26/2014   CLINICAL DATA:  RIGHT upper quadrant pain that began 2 days ago. Nausea and vomiting. Shallow breathing.  EXAM: CT ABDOMEN AND PELVIS WITHOUT CONTRAST  TECHNIQUE: Multidetector CT imaging of the abdomen and pelvis was performed following the standard protocol without IV contrast.  COMPARISON:  Portable abdominal radiograph earlier today.  FINDINGS: Pneumoperitoneum greatest in the LEFT paramedian mesentery. Generalized ascites. Thickened distal duodenum and proximal jejunal small bowel loop, possible pneumatosis. Suspect ischemic bowel with proximal jejunal perforation. No features to suggest colonic perforation due  to diverticulitis, or gastroduodenal ulcer.  Mild atheromatous change of the aorta without aneurysmal dilatation or visible proximal emboli.  Grossly unremarkable appearance of the liver, spleen, pancreas, adrenal glands, and kidneys given the unenhanced appearance. No acute osseous abnormalities. Patchy bibasilar opacities, RIGHT greater than LEFT.  IMPRESSION: Suspected ischemic proximal small bowel, with pneumoperitoneum and ascites. Findings discussed with ordering provider by myself at 8:45 p.m. 09/26/14.   Electronically Signed   By: Rolla Flatten M.D.   On: 09/26/2014 20:49   Portable Chest Xray  09/28/2014   CLINICAL DATA:  Assess endotracheal tube placement  EXAM: PORTABLE CHEST - 1 VIEW  COMPARISON:  Portable chest x-ray of September 27, 2014  FINDINGS: The lungs are slightly less well inflated today. There is bibasilar subsegmental atelectasis. There is persistent obscuration of the left hemidiaphragm secondary to a small pleural effusion. The bony thorax is unremarkable.  The endotracheal tube tip lies 4.2 cm above the crotch of the carina. The esophagogastric tube tip and proximal port projects below the GE junction. The right internal jugular venous catheter tip projects over the midportion of the SVC.  IMPRESSION: Stable appearance of the chest since yesterday's study. The endotracheal tube is in appropriate position.   Electronically Signed   By: David  Martinique   On: 09/28/2014 07:24   Dg Chest Port 1 View  09/27/2014   CLINICAL DATA:  Hypoxia  EXAM: PORTABLE CHEST - 1 VIEW  COMPARISON:  Study obtained earlier in the day  FINDINGS: Endotracheal tube tip is  1.9 cm above the carina. Nasogastric tube tip is in the stomach with the side port at the gastroesophageal junction. Central catheter tip in superior vena cava. No pneumothorax. There is left lower lobe consolidation with small left effusion. There is atelectatic change in the medial right base. Elsewhere lungs are clear. Heart size and  pulmonary vascularity are normal. No adenopathy. There is postoperative change in the lower cervical spine.  IMPRESSION: Tube and catheter positions as described without pneumothorax. Note that the nasogastric tube side port is essentially at the gastroesophageal junction. There is left lower lobe consolidation with small left effusion. There is mild right base atelectatic change.   Electronically Signed   By: Lowella Grip M.D.   On: 09/27/2014 13:24   Portable Chest Xray  09/27/2014   CLINICAL DATA:  Evaluate endotracheal tube placement. Initial encounter.  EXAM: PORTABLE CHEST - 1 VIEW  COMPARISON:  Chest radiograph performed 09/26/2014  FINDINGS: The patient's endotracheal tube is seen ending 4 cm above the carina. An enteric tube is noted extending below the diaphragm.  The lungs are mildly hypoexpanded. Mild bibasilar airspace opacities may reflect atelectasis or possibly mild pneumonia, demonstrating interval redistribution since the prior study. No pleural effusion or pneumothorax is seen.  The cardiomediastinal silhouette is normal in size. A right IJ line is noted ending about the mid SVC. No acute osseous abnormalities are seen. Cervical spinal fusion hardware is partially imaged.  IMPRESSION: 1. Endotracheal tube seen ending 4 cm above the carina. 2. Right IJ line noted ending about the mid SVC. 3. Lungs mildly hypoexpanded. Mild bibasilar airspace opacities may reflect atelectasis or possibly mild pneumonia.   Electronically Signed   By: Garald Balding M.D.   On: 09/27/2014 06:50   Dg Chest Port 1 View  09/26/2014   CLINICAL DATA:  Altered mental status, abdominal pain  EXAM: PORTABLE CHEST - 1 VIEW  COMPARISON:  Chest radiograph 04/01/2014  FINDINGS: Anterior cervical fusion noted. Normal cardiac silhouette. Linear opacity at the right lung base which new from prior. Low lung volumes.  IMPRESSION: Right lower lobe atelectasis versus infiltrate.   Electronically Signed   By: Suzy Bouchard  M.D.   On: 09/26/2014 20:00   Dg Abd Portable 1v  09/26/2014   CLINICAL DATA:  Altered mental status with abdominal pain.  EXAM: PORTABLE ABDOMEN - 1 VIEW  COMPARISON:  None.  FINDINGS: Suboptimal visualization due to the portable technique and body habitus. There are areas of concern for extraluminal air between the stomach bubble and splenic flexure under the LEFT hemidiaphragm, possible similar area under the RIGHT hemidiaphragm over the liver. No definite bowel obstruction. Osseous structures poorly visualized. Grossly unremarkable lung bases  IMPRESSION: Possible pneumoperitoneum. I discussed the findings with the emergency department physician at the time of interpretation. A noncontrast CT will be obtained as soon as possible for further evaluation.   Electronically Signed   By: Rolla Flatten M.D.   On: 09/26/2014 20:02    Anti-infectives: Anti-infectives    Start     Dose/Rate Route Frequency Ordered Stop   09/27/14 2100  ceFEPIme (MAXIPIME) 2 g in dextrose 5 % 50 mL IVPB  Status:  Discontinued     2 g100 mL/hr over 30 Minutes Intravenous Every 24 hours 09/27/14 0603 09/27/14 1103   09/27/14 2000  vancomycin (VANCOCIN) IVPB 1000 mg/200 mL premix  Status:  Discontinued     1,000 mg200 mL/hr over 60 Minutes Intravenous Every 24 hours 09/27/14 0603 09/27/14 1103  09/27/14 1400  vancomycin (VANCOCIN) IVPB 1000 mg/200 mL premix     1,000 mg200 mL/hr over 60 Minutes Intravenous Every 12 hours 09/27/14 1103     09/27/14 1200  ceFEPIme (MAXIPIME) 2 g in dextrose 5 % 50 mL IVPB     2 g100 mL/hr over 30 Minutes Intravenous Every 12 hours 09/27/14 1103     09/26/14 1930  vancomycin (VANCOCIN) IVPB 1000 mg/200 mL premix     1,000 mg200 mL/hr over 60 Minutes Intravenous  Once 09/26/14 1917 09/26/14 2210   09/26/14 1930  ceFEPIme (MAXIPIME) 2 g in dextrose 5 % 50 mL IVPB     2 g100 mL/hr over 30 Minutes Intravenous  Once 09/26/14 1917 09/26/14 2132      Assessment/Plan: s/p ex lap and oversew  gastric ulcer Patient Active Problem List   Diagnosis Date Noted  . 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 Acute respiratory failure with hypoxia 09/27/2014  . Severe sepsis 09/27/2014  . Perforated gastric ulcer 09/27/2014  . Sepsis 09/26/2014  . Altered mental state 09/26/2014  . Pneumoperitoneum 09/26/2014  . Prediabetes 07/23/2014  . Unspecified hereditary and idiopathic peripheral neuropathy 04/15/2014  . Overdose 04/02/2014  . CVA (cerebral infarction) 04/02/2014  . Leukocytosis 04/02/2014  . Hypertriglyceridemia 12/11/2013  . Stiffness of joint, not elsewhere classified, ankle and foot 11/12/2013  . Lack of coordination 11/03/2013  . Pernicious anemia 09/12/2013  . Hyperglycemia 09/12/2013  . Elevated transaminase level 09/12/2013  . Weakness 09/12/2013  . Ataxia 09/09/2013  . Difficulty in walking(719.7) 09/09/2013  . Weakness of both legs 09/09/2013  . UTI (urinary tract infection) 02/11/2013  . Generalized weakness 02/11/2013  . Tobacco abuse 01/14/2013  . Bilateral leg edema 01/14/2013  . Pedal edema 01/13/2013  . Chronic pain syndrome 12/30/2012  . Reactive airway disease 12/30/2012  . Encephalopathy acute 07/25/2011  . 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  start wet to dry dressing bid Cont NPO and NGT   LOS: 2 days    Tammy Panther A. 09/28/2014

## 2014-09-28 NOTE — Progress Notes (Signed)
ANTICOAGULATION CONSULT NOTE - Follow-up  Pharmacy Consult for heparin Indication: ?arterial clot s/p thrombectomy  Allergies  Allergen Reactions  . Ambien [Zolpidem Tartrate] Other (See Comments)    Crazy dreams  . Ceftin [Cefuroxime Axetil] Nausea And Vomiting  . Hctz [Hydrochlorothiazide] Other (See Comments)    Urinary retention  . Hydrocodone Nausea Only  . Lodine [Etodolac] Hives  . Promethazine Other (See Comments)    Hallucinations.   . Roxicodone [Oxycodone Hcl] Swelling  . Codeine Rash  . Latex Rash  . Penicillins Rash    Patient Measurements: Height: 4\' 9"  (144.8 cm) Weight: 159 lb 13.3 oz (72.5 kg) IBW/kg (Calculated) : 38.6 Heparin Dosing Weight: 70kg  Vital Signs: Temp: 101 F (38.3 C) (12/21 1134) Temp Source: Oral (12/21 1134) BP: 125/61 mmHg (12/21 1000) Pulse Rate: 78 (12/21 1000)  Labs:  Recent Labs  09/26/14 1900 09/27/14 0600 09/28/14 0342 09/28/14 1030  HGB 20.3* 13.7 12.4  --   HCT 60.4* 43.8 37.9  --   PLT 504* 265 211  --   HEPARINUNFRC  --   --   --  0.14*  CREATININE 1.63* 0.69 0.76  --   TROPONINI <0.30  --   --   --     Estimated Creatinine Clearance: 70.1 mL/min (by C-G formula based on Cr of 0.76).  Assessment: 48yo female c/o abdominal pain w/ N/V, denies abnormal BMs, was sent emergently to OR for suspected perforated small bowel, intraoperatively found instead large perforated ulcer. Patient now with L hand cyanosis likely from radial arterial thrombus, low likleyhood of PE so he was started on heparin. Initially started cautiously d/t recent surgery but MD increased rate to 1200 units/hr. Heparin level this AM was low at 0.14. Infusing well per RN. H/H and platelets are WNL.   Goal of Therapy:  Heparin level 0.3-0.5 units/ml Monitor platelets by anticoagulation protocol: Yes   Plan:  1. Increase heparin gtt to 1400 units/hr 2. Check an 8 hour heparin level 3. Daily heparin level and CBC  Salome Arnt, PharmD,  BCPS Pager # [phone] 09/28/2014 11:46 AM

## 2014-09-28 NOTE — Progress Notes (Signed)
  Progress Note    09/28/2014 1:03 AM 1 Day Post-Op  Subjective:  Intubated and sedated  Tm 102.4 now 100.8 HR 90's-140's 48'N-462'V systolic 03% .60 FiO2  Filed Vitals:   09/28/14 0030  BP: 121/60  Pulse: 88  Temp:   Resp: 24    Physical Exam: Cardiac:  regular Extremities:  Strong left palpable radial pulse;  palpable right radial pulse.  Fingers on right finger continue to be dusky   CBC    Component Value Date/Time   WBC 22.6* 09/27/2014 0600   RBC 4.38 09/27/2014 0600   HGB 13.7 09/27/2014 0600   HCT 43.8 09/27/2014 0600   PLT 265 09/27/2014 0600   MCV 100.0 09/27/2014 0600   MCH 31.3 09/27/2014 0600   MCHC 31.3 09/27/2014 0600   RDW 12.6 09/27/2014 0600   LYMPHSABS 2.1 09/26/2014 1900   MONOABS 0.8 09/26/2014 1900   EOSABS 0.0 09/26/2014 1900   BASOSABS 0.0 09/26/2014 1900    BMET    Component Value Date/Time   NA 142 09/27/2014 0600   K 4.5 09/27/2014 0600   CL 110 09/27/2014 0600   CO2 20 09/27/2014 0600   GLUCOSE 140* 09/27/2014 0600   BUN 9 09/27/2014 0600   CREATININE 0.69 09/27/2014 0600   CREATININE 0.80 12/08/2013 1313   CALCIUM 6.5* 09/27/2014 0600   GFRNONAA >90 09/27/2014 0600   GFRAA >90 09/27/2014 0600    INR No results found for: INR   Intake/Output Summary (Last 24 hours) at 09/28/14 0103 Last data filed at 09/28/14 0000  Gross per 24 hour  Intake 7308.26 ml  Output   4265 ml  Net 3043.26 ml     Assessment:  48 y.o. female is s/p:  1. Redo Left brachial, ulnar, and radial exposure 2. Thrombectomy Left brachial, ulnar, adn radial artery 3. Angiogram, left upper extremity 4. Left brachial to left radial artery bypass with left leg non-reversed greater saphenous vein 5. Intra-arterial injection of TPA  1 Day Post-Op  Plan: -pt continues to have a strong palpable left radial pulse s/p bypass.  She does have some  duskiness of her fingers on the right hand, but still has a palpable radial pulse. 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 CA.   -DVT prophylaxis:  Heparin gtt -heparin level at 4am and then heparin per pharmacy (she is on 1200U/hr at this time) -ex lap for perforated gastric ulcer per general surgery -labs in am -continue Abx   Leontine Locket, PA-C [facility] [phone] 09/28/2014 1:03 AM    I agree with the above.  The patient had a very difficult time getting perfusion to her left hand which ultimately required brachial to radial artery bypass graft.  She still has mottling of her fingertips and has new mottling on her right hand with a palpable right radial pulse.  She is on IV heparin.  We have ordered a TEE to rule out some central source.  She did have a CT scan which did not show any obvious source.  Annamarie Major

## 2014-09-28 NOTE — Progress Notes (Signed)
  Echocardiogram 2D Echocardiogram has been performed.  [name] FRANCES 09/28/2014, 10:48 AM

## 2014-09-28 NOTE — Progress Notes (Signed)
ANTICOAGULATION CONSULT NOTE - Follow-up  Pharmacy Consult for heparin Indication: ?arterial clot s/p thrombectomy  Allergies  Allergen Reactions  . Ambien [Zolpidem Tartrate] Other (See Comments)    Crazy dreams  . Ceftin [Cefuroxime Axetil] Nausea And Vomiting  . Hctz [Hydrochlorothiazide] Other (See Comments)    Urinary retention  . Hydrocodone Nausea Only  . Lodine [Etodolac] Hives  . Promethazine Other (See Comments)    Hallucinations.   . Roxicodone [Oxycodone Hcl] Swelling  . Codeine Rash  . Latex Rash  . Penicillins Rash    Patient Measurements: Height: 4\' 9"  (144.8 cm) Weight: 159 lb 13.3 oz (72.5 kg) IBW/kg (Calculated) : 38.6 Heparin Dosing Weight: 70kg  Vital Signs: Temp: 99.5 F (37.5 C) (12/21 1924) Temp Source: Oral (12/21 1924) BP: 148/69 mmHg (12/21 2000) Pulse Rate: 99 (12/21 2000)  Labs:  Recent Labs  09/26/14 1900  09/27/14 0340 09/27/14 0600 09/28/14 0342 09/28/14 1030 09/28/14 1952  HGB 20.3*  < > 13.9 13.7 12.4  --   --   HCT 60.4*  < > 41.0 43.8 37.9  --   --   PLT 504*  --   --  265 211  --   --   HEPARINUNFRC  --   --   --   --   --  0.14* 0.38  CREATININE 1.63*  --   --  0.69 0.76  --   --   TROPONINI <0.30  --   --   --   --   --   --   < > = values in this interval not displayed.  Estimated Creatinine Clearance: 70.1 mL/min (by C-G formula based on Cr of 0.76).  Assessment: 48yo female c/o abdominal pain w/ N/V, denies abnormal BMs, was sent emergently to OR for suspected perforated small bowel, intraoperatively found instead large perforated ulcer. Patient now with L hand cyanosis likely from radial arterial thrombus, low likleyhood of PE so he was started on heparin.  Heparin is now at goal (HL= 0.38)  after increase to 1400 units/hr.  Goal of Therapy:  Heparin level 0.3-0.5 units/ml Monitor platelets by anticoagulation protocol: Yes   Plan:  -No heparin changes needed -Daily heparin level and CBC  Hildred Laser, Pharm D  09/28/2014 8:56 PM

## 2014-09-28 NOTE — Progress Notes (Addendum)
PULMONARY / CRITICAL CARE MEDICINE HISTORY AND PHYSICAL EXAMINATION   Name: [name] MRN: [id number] DOB: 15-May-1966    ADMISSION DATE:  09/26/2014  PRIMARY SERVICE: PCCM  CHIEF COMPLAINT:  Abd pain  BRIEF PATIENT DESCRIPTION: 18yof smoker with PMH of MS, depression presents with abd pain, found to have pneumoperitoneum 2/2 small bowel rupture, also with new cold L hand.  Going to OR imminently.    SIGNIFICANT EVENTS / STUDIES:  09/26/14: Admitted, CT Abd with pneumoperitoneum, cold L hand concerning for arterial clot as well. 12/20 > ex-lap [name]) with omental patch for perforated prepyloric gastric ulcer; L brachial and ulnar artery embolectomy ([facility]) 12/20 LE doppler >>neg 12/20 >>Left brachial to left radial artery bypass with left leg greater saphenous vein for Recurrent occlusion   LINES / TUBES: R IJ CVL 12/20 >> JP drain RUQ 12/20 >>    SUBJECTIVE: Low gr fever On precedex Self extubated 12/21 - had to be reintubated  VITAL SIGNS: Temp:  [100.2 F (37.9 C)-102.4 F (39.1 C)] 100.3 F (37.9 C) (12/21 0716) Pulse Rate:  [30-136] 88 (12/21 0713) Resp:  [20-25] 21 (12/21 0713) BP: (96-152)/(48-82) 132/58 mmHg (12/21 0713) SpO2:  [71 %-100 %] 94 % (12/21 0713) Arterial Line BP: (74-160)/(51-91) 88/53 mmHg (12/20 1600) FiO2 (%):  [60 %-100 %] 60 % (12/21 0713) Weight:  [72.5 kg (159 lb 13.3 oz)] 72.5 kg (159 lb 13.3 oz) (12/21 0500) HEMODYNAMICS:    Vent Mode:  [-] PRVC FiO2 (%):  [60 %-100 %] 60 % Set Rate:  [24 bmp] 24 bmp Vt Set:  [450 mL] 450 mL PEEP:  [5 cmH20] 5 cmH20 Plateau Pressure:  [18 cmH20-22 cmH20] 20 cmH20 INTAKE / OUTPUT: Intake/Output      12/20 0701 - 12/21 0700 12/21 0701 - 12/22 0700   I.V. (mL/kg) 3727.3 (51.4)    IV Piggyback 1000    Total Intake(mL/kg) 4727.3 (65.2)    Urine (mL/kg/hr) 1825 (1)    Emesis/NG output 375 (0.2)    Drains 160 (0.1)    Other     Blood     Total Output 2360     Net +2367.3             PHYSICAL EXAMINATION:  Gen: awake, int agitated on vent HEENT: NCAT ETT in place PULM: CTA B CV: Tachy, regular, no mgr AB: no bowel sounds, midline scar well dressed Ext: feet warm, well perfused, R hand warm, cyanotic fingers, left hand dusky, cool Neuro: RASS-2, follows commands  LABS:  CBC  Recent Labs Lab 09/26/14 1900 09/27/14 0600 09/28/14 0342  WBC 18.2* 22.6* 15.5*  HGB 20.3* 13.7 12.4  HCT 60.4* 43.8 37.9  PLT 504* 265 211   Coag's No results for input(s): APTT, INR in the last 168 hours. BMET  Recent Labs Lab 09/26/14 1900 09/27/14 0600 09/28/14 0342  NA 138 142 140  K 4.8 4.5 4.4  CL 94* 110 108  CO2 27 20 18*  BUN 10 9 11   CREATININE 1.63* 0.69 0.76  GLUCOSE 139* 140* 128*   Electrolytes  Recent Labs Lab 09/26/14 1900 09/27/14 0600 09/28/14 0342  CALCIUM 9.3 6.5* 8.1*   Sepsis Markers  Recent Labs Lab 09/26/14 2056 09/26/14 2134  LATICACIDVEN 6.6* 6.69*   ABG  Recent Labs Lab 09/27/14 0559 09/27/14 0743  PHART 7.173* 7.341*  PCO2ART 57.2* 36.6  PO2ART 102.0* 59.0*   Liver Enzymes  Recent Labs Lab 09/26/14 1900  AST 16  ALT 8  ALKPHOS 109  BILITOT 0.6  ALBUMIN 2.1*   Cardiac Enzymes  Recent Labs Lab 09/26/14 1900  TROPONINI <0.30   Glucose  Recent Labs Lab 09/26/14 1852  GLUCAP 161*    Imaging Ct Abdomen Pelvis Wo Contrast  09/26/2014   CLINICAL DATA:  RIGHT upper quadrant pain that began 2 days ago. Nausea and vomiting. Shallow breathing.  EXAM: CT ABDOMEN AND PELVIS WITHOUT CONTRAST  TECHNIQUE: Multidetector CT imaging of the abdomen and pelvis was performed following the standard protocol without IV contrast.  COMPARISON:  Portable abdominal radiograph earlier today.  FINDINGS: Pneumoperitoneum greatest in the LEFT paramedian mesentery. Generalized ascites. Thickened distal duodenum and proximal jejunal small bowel loop, possible pneumatosis. Suspect ischemic bowel with proximal jejunal perforation. No  features to suggest colonic perforation due to diverticulitis, or gastroduodenal ulcer.  Mild atheromatous change of the aorta without aneurysmal dilatation or visible proximal emboli.  Grossly unremarkable appearance of the liver, spleen, pancreas, adrenal glands, and kidneys given the unenhanced appearance. No acute osseous abnormalities. Patchy bibasilar opacities, RIGHT greater than LEFT.  IMPRESSION: Suspected ischemic proximal small bowel, with pneumoperitoneum and ascites. Findings discussed with ordering provider by myself at 8:45 p.m. 09/26/14.   Electronically Signed   By: Rolla Flatten M.D.   On: 09/26/2014 20:49   Portable Chest Xray  09/28/2014   CLINICAL DATA:  Assess endotracheal tube placement  EXAM: PORTABLE CHEST - 1 VIEW  COMPARISON:  Portable chest x-ray of September 27, 2014  FINDINGS: The lungs are slightly less well inflated today. There is bibasilar subsegmental atelectasis. There is persistent obscuration of the left hemidiaphragm secondary to a small pleural effusion. The bony thorax is unremarkable.  The endotracheal tube tip lies 4.2 cm above the crotch of the carina. The esophagogastric tube tip and proximal port projects below the GE junction. The right internal jugular venous catheter tip projects over the midportion of the SVC.  IMPRESSION: Stable appearance of the chest since yesterday's study. The endotracheal tube is in appropriate position.   Electronically Signed   By: David  Martinique   On: 09/28/2014 07:24   Dg Chest Port 1 View  09/27/2014   CLINICAL DATA:  Hypoxia  EXAM: PORTABLE CHEST - 1 VIEW  COMPARISON:  Study obtained earlier in the day  FINDINGS: Endotracheal tube tip is 1.9 cm above the carina. Nasogastric tube tip is in the stomach with the side port at the gastroesophageal junction. Central catheter tip in superior vena cava. No pneumothorax. There is left lower lobe consolidation with small left effusion. There is atelectatic change in the medial right base.  Elsewhere lungs are clear. Heart size and pulmonary vascularity are normal. No adenopathy. There is postoperative change in the lower cervical spine.  IMPRESSION: Tube and catheter positions as described without pneumothorax. Note that the nasogastric tube side port is essentially at the gastroesophageal junction. There is left lower lobe consolidation with small left effusion. There is mild right base atelectatic change.   Electronically Signed   By: Lowella Grip M.D.   On: 09/27/2014 13:24   Portable Chest Xray  09/27/2014   CLINICAL DATA:  Evaluate endotracheal tube placement. Initial encounter.  EXAM: PORTABLE CHEST - 1 VIEW  COMPARISON:  Chest radiograph performed 09/26/2014  FINDINGS: The patient's endotracheal tube is seen ending 4 cm above the carina. An enteric tube is noted extending below the diaphragm.  The lungs are mildly hypoexpanded. Mild bibasilar airspace opacities may reflect atelectasis or possibly mild pneumonia, demonstrating interval redistribution since the prior study. No  pleural effusion or pneumothorax is seen.  The cardiomediastinal silhouette is normal in size. A right IJ line is noted ending about the mid SVC. No acute osseous abnormalities are seen. Cervical spinal fusion hardware is partially imaged.  IMPRESSION: 1. Endotracheal tube seen ending 4 cm above the carina. 2. Right IJ line noted ending about the mid SVC. 3. Lungs mildly hypoexpanded. Mild bibasilar airspace opacities may reflect atelectasis or possibly mild pneumonia.   Electronically Signed   By: Garald Balding M.D.   On: 09/27/2014 06:50   Dg Chest Port 1 View  09/26/2014   CLINICAL DATA:  Altered mental status, abdominal pain  EXAM: PORTABLE CHEST - 1 VIEW  COMPARISON:  Chest radiograph 04/01/2014  FINDINGS: Anterior cervical fusion noted. Normal cardiac silhouette. Linear opacity at the right lung base which new from prior. Low lung volumes.  IMPRESSION: Right lower lobe atelectasis versus infiltrate.    Electronically Signed   By: Suzy Bouchard M.D.   On: 09/26/2014 20:00   Dg Abd Portable 1v  09/26/2014   CLINICAL DATA:  Altered mental status with abdominal pain.  EXAM: PORTABLE ABDOMEN - 1 VIEW  COMPARISON:  None.  FINDINGS: Suboptimal visualization due to the portable technique and body habitus. There are areas of concern for extraluminal air between the stomach bubble and splenic flexure under the LEFT hemidiaphragm, possible similar area under the RIGHT hemidiaphragm over the liver. No definite bowel obstruction. Osseous structures poorly visualized. Grossly unremarkable lung bases  IMPRESSION: Possible pneumoperitoneum. I discussed the findings with the emergency department physician at the time of interpretation. A noncontrast CT will be obtained as soon as possible for further evaluation.   Electronically Signed   By: Rolla Flatten M.D.   On: 09/26/2014 20:02    EKG: Sinus tach with occasional PAC.  No ST changes. CXR: bibasal atelectasis, small left effusion  ASSESSMENT / PLAN:  Principal Problem:   Pneumoperitoneum Active Problems:   Sepsis   Altered mental state   Brachial artery occlusion   Acute respiratory failure with hypoxia   Severe sepsis   Perforated gastric ulcer   PULMONARY A: ETT 12/19 >> 12/20 (self extubation) >> Acute respiratory failure with hypoxemia> uncertain etiology, no PE, Has COPD  P:    Continue full vent support duoneb prn No extubation until TEE done  CARDIOVASCULAR A: Sinus tachycardia and hypertension exacerbated by post op pain/anxiety Acute left brachial and ulnar clot, per vascular appeared > 24 hours old but uncertain etiology Rt hand ischemia ? embolic P:   Labetalol prn Tele Heparin gtt, no bolus Await TEE  RENAL A: AKI with Cr of 1.63> resolved High risk AKI due to contrast P:   Monitor BMET and UOP Replace electrolytes as needed Keep hydrated   GASTROINTESTINAL A: Perforated gastric ulcer and pneumoperitoneum  P:    Post op care per surgery Nutrition per general surgery  HEMATOLOGIC A: No acute issues P:   Monitor for bleeding on heparin   INFECTIOUS A: Severe Sepsis due to peritonitis P:   Blood cx: 09/26/14-->  NGTD 12/20 peritoneal fluid >>  Vanc: 09/26/14--> Zosyn:  09/26/14-->   ENDOCRINE A: No acute issues P:     NEUROLOGIC A: Abd pain Depression Multiple sclerosis P:   RASS goal -1 Add precedx Fentanyl gtt Holding home SSRI and benzo in setting of NPO status   Social / Family:  Boyfriend, [name], [place]: [phone] updated   TODAY'S SUMMARY: Gastric ulcer perforation, peritonitis, brachial artery clot, both dealt  with surgically but now with persistent hypoxemia, & RUE ischemia ? Embolic source  The patient is critically ill with multiple organ systems failure and requires high complexity decision making for assessment and support, frequent evaluation and titration of therapies, application of advanced monitoring technologies and extensive interpretation of multiple databases. Critical Care Time devoted to patient care services described in this note independent of APP time is 35 minutes.    Kara Mead MD. Shade Flood. [facility] Pager [phone] If no response call [phone]     09/28/2014, 9:00 AM

## 2014-09-29 ENCOUNTER — Inpatient Hospital Stay (HOSPITAL_COMMUNITY): Payer: BLUE CROSS/BLUE SHIELD

## 2014-09-29 LAB — BLOOD GAS, ARTERIAL
Acid-base deficit: 5.3 mmol/L — ABNORMAL HIGH (ref 0.0–2.0)
Bicarbonate: 18.5 mEq/L — ABNORMAL LOW (ref 20.0–24.0)
DRAWN BY: 41308
FIO2: 0.6 %
LHR: 24 {breaths}/min
MECHVT: 450 mL
O2 SAT: 94.4 %
PEEP/CPAP: 5 cmH2O
Patient temperature: 98.6
TCO2: 19.4 mmol/L (ref 0–100)
pCO2 arterial: 29.6 mmHg — ABNORMAL LOW (ref 35.0–45.0)
pH, Arterial: 7.412 (ref 7.350–7.450)
pO2, Arterial: 68.9 mmHg — ABNORMAL LOW (ref 80.0–100.0)

## 2014-09-29 LAB — CBC
HEMATOCRIT: 34.1 % — AB (ref 36.0–46.0)
HEMOGLOBIN: 10.9 g/dL — AB (ref 12.0–15.0)
MCH: 31.7 pg (ref 26.0–34.0)
MCHC: 32 g/dL (ref 30.0–36.0)
MCV: 99.1 fL (ref 78.0–100.0)
Platelets: 168 10*3/uL (ref 150–400)
RBC: 3.44 MIL/uL — ABNORMAL LOW (ref 3.87–5.11)
RDW: 13.3 % (ref 11.5–15.5)
WBC: 11.7 10*3/uL — ABNORMAL HIGH (ref 4.0–10.5)

## 2014-09-29 LAB — BASIC METABOLIC PANEL
Anion gap: 7 (ref 5–15)
BUN: 10 mg/dL (ref 6–23)
CO2: 20 mmol/L (ref 19–32)
Calcium: 6.8 mg/dL — ABNORMAL LOW (ref 8.4–10.5)
Chloride: 112 mEq/L (ref 96–112)
Creatinine, Ser: 0.94 mg/dL (ref 0.50–1.10)
GFR calc Af Amer: 81 mL/min — ABNORMAL LOW (ref >90)
GFR calc non Af Amer: 70 mL/min — ABNORMAL LOW (ref >90)
GLUCOSE: 196 mg/dL — AB (ref 70–99)
Potassium: 2.6 mmol/L — CL (ref 3.5–5.1)
Sodium: 139 mmol/L (ref 135–145)

## 2014-09-29 LAB — GLUCOSE, CAPILLARY
GLUCOSE-CAPILLARY: 79 mg/dL (ref 70–99)
Glucose-Capillary: 37 mg/dL — CL (ref 70–99)
Glucose-Capillary: 83 mg/dL (ref 70–99)

## 2014-09-29 LAB — URINE CULTURE
COLONY COUNT: NO GROWTH
CULTURE: NO GROWTH

## 2014-09-29 LAB — HEPARIN LEVEL (UNFRACTIONATED)
Heparin Unfractionated: 0.27 IU/mL — ABNORMAL LOW (ref 0.30–0.70)
Heparin Unfractionated: 0.3 IU/mL (ref 0.30–0.70)

## 2014-09-29 LAB — VANCOMYCIN, TROUGH: Vancomycin Tr: 17.5 ug/mL (ref 10.0–20.0)

## 2014-09-29 MED ORDER — DEXTROSE 50 % IV SOLN
1.0000 | Freq: Once | INTRAVENOUS | Status: AC
  Administered 2014-09-29: 50 mL via INTRAVENOUS

## 2014-09-29 MED ORDER — DEXTROSE-NACL 5-0.9 % IV SOLN
INTRAVENOUS | Status: AC
  Administered 2014-09-29: 10:00:00 via INTRAVENOUS
  Administered 2014-09-30: 75 mL via INTRAVENOUS
  Administered 2014-09-30: 15:00:00 via INTRAVENOUS

## 2014-09-29 MED ORDER — DEXTROSE 50 % IV SOLN
INTRAVENOUS | Status: AC
  Filled 2014-09-29: qty 50

## 2014-09-29 MED ORDER — POTASSIUM CHLORIDE 10 MEQ/50ML IV SOLN
10.0000 meq | INTRAVENOUS | Status: AC
  Administered 2014-09-29 (×4): 10 meq via INTRAVENOUS
  Filled 2014-09-29 (×4): qty 50

## 2014-09-29 MED ORDER — POTASSIUM CHLORIDE 20 MEQ/15ML (10%) PO SOLN
40.0000 meq | Freq: Every day | ORAL | Status: DC
  Filled 2014-09-29: qty 30

## 2014-09-29 MED ORDER — ACETAMINOPHEN 650 MG RE SUPP
650.0000 mg | RECTAL | Status: DC | PRN
  Administered 2014-09-29 – 2014-10-09 (×5): 650 mg via RECTAL
  Filled 2014-09-29 (×5): qty 1

## 2014-09-29 NOTE — Progress Notes (Signed)
PULMONARY / CRITICAL CARE MEDICINE HISTORY AND PHYSICAL EXAMINATION   Name: [name] MRN: [id number] DOB: Sep 16, 1966    ADMISSION DATE:  09/26/2014  PRIMARY SERVICE: PCCM  CHIEF COMPLAINT:  Abd pain  BRIEF PATIENT DESCRIPTION: 48yof smoker with PMH of MS, depression presents with abd pain, found to have pneumoperitoneum 2/2 small bowel rupture, also with new cold L hand.  Going to OR imminently.    SIGNIFICANT EVENTS / STUDIES:  09/26/14: Admitted, CT Abd with pneumoperitoneum, cold L hand concerning for arterial clot as well. 12/20 > ex-lap [name]) with omental patch for perforated prepyloric gastric ulcer; L brachial and ulnar artery embolectomy ([facility]) 12/20 LE doppler >>neg 12/20 >>Left brachial to left radial artery bypass with left leg greater saphenous vein for Recurrent occlusion  12/20 Self extubated - had to be reintubated 12/21 echo >>nml LVEF  LINES / TUBES: R IJ CVL 12/20 >> JP drain RUQ 12/20 >>    SUBJECTIVE: febrile Agitated when drips lowered   VITAL SIGNS: Temp:  [99.5 F (37.5 C)-103.1 F (39.5 C)] 101.8 F (38.8 C) (12/22 0700) Pulse Rate:  [78-107] 86 (12/22 0700) Resp:  [23-28] 24 (12/22 0700) BP: (100-192)/(54-107) 137/58 mmHg (12/22 0700) SpO2:  [93 %-96 %] 96 % (12/22 0700) FiO2 (%):  [50 %-60 %] 50 % (12/22 0418) Weight:  [73.5 kg (162 lb 0.6 oz)] 73.5 kg (162 lb 0.6 oz) (12/22 0500) HEMODYNAMICS:    Vent Mode:  [-] PRVC FiO2 (%):  [50 %-60 %] 50 % Set Rate:  [24 bmp] 24 bmp Vt Set:  [450 mL] 450 mL PEEP:  [5 cmH20] 5 cmH20 Plateau Pressure:  [20 cmH20-22 cmH20] 21 cmH20 INTAKE / OUTPUT: Intake/Output      12/21 0701 - 12/22 0700 12/22 0701 - 12/23 0700   I.V. (mL/kg) 3761.3 (51.2)    NG/GT 90    IV Piggyback 250    Total Intake(mL/kg) 4101.3 (55.8)    Urine (mL/kg/hr) 750 (0.4)    Emesis/NG output 600 (0.3)    Drains 80 (0)    Total Output 1430     Net +2671.3            PHYSICAL EXAMINATION:  Gen: awake, int  agitated on vent HEENT: NCAT ETT in place PULM: CTA B CV: Tachy, regular, no mgr AB: no bowel sounds, midline scar well dressed Ext: feet warm, well perfused, R hand warm, cyanotic fingers, left hand dusky, cool Neuro: RASS-2, follows commands  LABS:  CBC  Recent Labs Lab 09/27/14 0600 09/28/14 0342 09/29/14 0500  WBC 22.6* 15.5* 11.7*  HGB 13.7 12.4 10.9*  HCT 43.8 37.9 34.1*  PLT 265 211 168   Coag's No results for input(s): APTT, INR in the last 168 hours. BMET  Recent Labs Lab 09/27/14 0600 09/28/14 0342 09/29/14 0500  NA 142 140 139  K 4.5 4.4 2.6*  CL 110 108 112  CO2 20 18* 20  BUN 9 11 10   CREATININE 0.69 0.76 0.94  GLUCOSE 140* 128* 196*   Electrolytes  Recent Labs Lab 09/27/14 0600 09/28/14 0342 09/29/14 0500  CALCIUM 6.5* 8.1* 6.8*   Sepsis Markers  Recent Labs Lab 09/26/14 2056 09/26/14 2134  LATICACIDVEN 6.6* 6.69*   ABG  Recent Labs Lab 09/27/14 0559 09/27/14 0743 09/29/14 0420  PHART 7.173* 7.341* 7.412  PCO2ART 57.2* 36.6 29.6*  PO2ART 102.0* 59.0* 68.9*   Liver Enzymes  Recent Labs Lab 09/26/14 1900  AST 16  ALT 8  ALKPHOS 109  BILITOT 0.6  ALBUMIN 2.1*   Cardiac Enzymes  Recent Labs Lab 09/26/14 1900  TROPONINI <0.30   Glucose  Recent Labs Lab 09/26/14 1852 09/29/14 0453 09/29/14 0518 09/29/14 0835  GLUCAP 161* 37* 83 79    Imaging Ct Angio Up Extrem Left W/cm &/or Wo/cm  09/28/2014   CLINICAL DATA:  Recurrent ischemia in the left upper extremity. Evaluate for a source for embolic disease.  EXAM: CT ANGIOGRAPHY UPPER LEFT EXTREMITY  TECHNIQUE: Multidetector CT imaging of the left upper extremity was performed using the standard protocol during bolus administration of intravenous contrast. Multiplanar CT image reconstructions and MIPS were obtained to evaluate the vascular anatomy. These images were obtained simultaneously with a CTA of the chest.  CONTRAST:  138mL OMNIPAQUE IOHEXOL 350 MG/ML SOLN   COMPARISON:  Chest CTA 09/27/2014  FINDINGS: There is an elongated thrombus involving the proximal left subclavian artery which originates approximately 2 cm from the left subclavian origin. This thrombus is nonocclusive. Please refer to the chest CTA for further characterization. The left subclavian artery and left axillary artery are patent. The left brachial artery is patent. There is poor flow in the left upper extremity artery at the level of the brachial artery bifurcation. Minimal flow identified in the left forearm. There are postsurgical changes in the forearm consistent with a recent thrombectomy. No significant flow identified in the wrist or hand.  Incidental imaging of the chest, abdomen and pelvis were obtained. The thoracic aorta is patent. The abdominal aorta is patent without aneurysm or dissection. There is flow in the common iliac arteries bilaterally. Flow in the proximal external and internal iliac arteries bilaterally. Diffuse low-attenuation of the liver is compatible with steatosis. Mild distention of the gallbladder. Small to moderate upper abdominal ascites. Patient has an anterior abdominal wound from recent abdominal surgery. There is a surgical drain in the upper abdomen. Nasogastric tube tip in the distal stomach. No gross abnormality to the spleen, pancreas, adrenal glands or kidneys. Probable 1 cm cyst in the left kidney lower pole. Small amount of fluid in the upper pelvis. No acute bone abdomen abnormality.  There is extensive consolidation and volume loss in the lower lobes bilaterally. Trace bilateral pleural effusions. Negative for pneumothorax.  Review of the MIP images confirms the above findings.  IMPRESSION: Nonocclusive thrombus in the proximal left subclavian artery. This could be a source for embolic disease.  No significant arterial flow in the left forearm or left hand. Occlusion appears to be in the proximal forearm region just beyond the brachial artery bifurcation.   Abdominal ascites.  Postoperative changes in the abdomen.  Consolidation and marked volume loss in lower lobes bilaterally. Small pleural effusions.   Electronically Signed   By: Markus Daft M.D.   On: 09/28/2014 11:59   Dg Chest Port 1 View  09/29/2014   CLINICAL DATA:  Respiratory failure.  EXAM: PORTABLE CHEST - 1 VIEW  COMPARISON:  09/28/2014.  FINDINGS: Endotracheal tube, NG tube, right IJ line stable position. Mediastinum hilar structures are stable. Bibasilar atelectasis and/or infiltrates again noted. Small left pleural effusion cannot be excluded. No pneumothorax. Heart size normal. No acute osseous abnormality.  IMPRESSION: 1. Lines and tubes in stable position. 2. Stable bibasilar atelectasis and/or infiltrates with small left pleural effusion.   Electronically Signed   By: Marcello Moores  Register   On: 09/29/2014 07:33   Portable Chest Xray  09/28/2014   CLINICAL DATA:  Assess endotracheal tube placement  EXAM: PORTABLE CHEST - 1 VIEW  COMPARISON:  Portable chest x-ray of September 27, 2014  FINDINGS: The lungs are slightly less well inflated today. There is bibasilar subsegmental atelectasis. There is persistent obscuration of the left hemidiaphragm secondary to a small pleural effusion. The bony thorax is unremarkable.  The endotracheal tube tip lies 4.2 cm above the crotch of the carina. The esophagogastric tube tip and proximal port projects below the GE junction. The right internal jugular venous catheter tip projects over the midportion of the SVC.  IMPRESSION: Stable appearance of the chest since yesterday's study. The endotracheal tube is in appropriate position.   Electronically Signed   By: David  Martinique   On: 09/28/2014 07:24   Dg Chest Port 1 View  09/27/2014   CLINICAL DATA:  Hypoxia  EXAM: PORTABLE CHEST - 1 VIEW  COMPARISON:  Study obtained earlier in the day  FINDINGS: Endotracheal tube tip is 1.9 cm above the carina. Nasogastric tube tip is in the stomach with the side port at the  gastroesophageal junction. Central catheter tip in superior vena cava. No pneumothorax. There is left lower lobe consolidation with small left effusion. There is atelectatic change in the medial right base. Elsewhere lungs are clear. Heart size and pulmonary vascularity are normal. No adenopathy. There is postoperative change in the lower cervical spine.  IMPRESSION: Tube and catheter positions as described without pneumothorax. Note that the nasogastric tube side port is essentially at the gastroesophageal junction. There is left lower lobe consolidation with small left effusion. There is mild right base atelectatic change.   Electronically Signed   By: Lowella Grip M.D.   On: 09/27/2014 13:24   Dg Ang/ext/uni/or Left  09/28/2014   CLINICAL DATA:  Left upper extremity arterial occlusion  EXAM: LEFT ANG/EXT/UNI/ OR  COMPARISON:  09/27/2014  FINDINGS: Limited single view intraoperative angiogram performed of the left forearm. This demonstrates patency of the radial artery with mid forearm diffuse radial artery irregularity/ disease. This is nonocclusive. Distal radial artery is patent supplying a portion of the palmar arch and digital branches. Thrombus is suspected in the interosseous artery proximally. Ulnar artery appears to taper to occlusion.  IMPRESSION: Diseased but patent left mid radial artery compatible with residual disease or thrombus. Suspect small amount of nonocclusive thrombus in the interosseous artery proximally.  Ulnar artery tapers to occlusion   Electronically Signed   By: Daryll Brod M.D.   On: 09/28/2014 10:33   Ct Angio Chest Aorta W/cm &/or Wo/cm  09/28/2014   CLINICAL DATA:  48 year old with left upper extremity ischemia. Recurrent ischemia despite surgical thrombectomy.  EXAM: CT ANGIOGRAPHY CHEST WITH CONTRAST  TECHNIQUE: Multidetector CT imaging of the chest was performed using the standard protocol during bolus administration of intravenous contrast. Multiplanar CT image  reconstructions and MIPs were obtained to evaluate the vascular anatomy. Images were obtained simultaneously with a CTA of the left upper extremity.  CONTRAST:  165mL OMNIPAQUE IOHEXOL 350 MG/ML SOLN  COMPARISON:  Chest radiograph 09/27/2014  FINDINGS: Motion artifact at the aortic root. The ascending thoracic aorta measures up to 2.8 cm. The great vessels are patent. There is intraluminal thrombus in the proximal left subclavian artery. Thrombus originates approximately 2 cm from the left subclavian origin. Clot roughly measures 1.3 cm in length. The clot terminates near the origin of the left vertebral artery. This thrombus is nonocclusive. The proximal vertebral arteries are patent bilaterally. The left axillary artery is patent. The left brachial artery is patent. Papillary muscles are prominent in the left ventricle on sequence 2,  image 174. Limited evaluation of the cardiac anatomy on this examination.  The main and central pulmonary arteries are patent. Low-attenuation of the liver could represent steatosis. There is upper abdominal ascites around the spleen. Nasogastric tube extends into the stomach. Endotracheal tube is well positioned in the carina.  Small amount of bilateral pleural effusions. There is dependent consolidation in the lower lobes bilaterally. No significant airspace disease in the upper lobes. Probable atelectasis in the lingula. Negative for pneumothorax. No acute bone abnormality.  Review of the MIP images confirms the above findings.  IMPRESSION: Thrombus in the left subclavian artery, originating approximately 2 cm from the origin. This thrombus is nonocclusive but could be a source for embolic disease in the left upper extremity. No clear evidence for a dissection.  The papillary muscles appear to be prominent in the left ventricle. These findings are nonspecific on this examination and recommend correlation with echocardiogram.  There is marked volume loss and consolidation in the  lower lobes bilaterally. Trace bilateral pleural effusions.  Abdominal ascites.   Electronically Signed   By: Markus Daft M.D.   On: 09/28/2014 09:54    EKG: Sinus tach with occasional PAC.  No ST changes. CXR: bibasal atelectasis, small left effusion  ASSESSMENT / PLAN:  Principal Problem:   Pneumoperitoneum Active Problems:   Sepsis   Altered mental state   Brachial artery occlusion   Acute respiratory failure with hypoxia   Severe sepsis   Perforated gastric ulcer   PULMONARY A: ETT 12/19 >> 12/20 (self extubation) >> Acute respiratory failure with hypoxemia> uncertain etiology, no PE, Has COPD  P:   duoneb prn SBTs   CARDIOVASCULAR A: Sinus tachycardia and hypertension exacerbated by post op pain/anxiety Acute left brachial and ulnar clot, per vascular appeared > 24 hours old but uncertain etiology Rt hand ischemia ? embolic P:   Labetalol prn Heparin gtt Await TEE  RENAL A: AKI with Cr of 1.63> resolved High risk AKI due to contrast Hypokalemia P:   Monitor BMET and UOP Replace electrolytes as needed Keep hydrated -avoid secondary insults   GASTROINTESTINAL A: Perforated gastric ulcer and pneumoperitoneum  P:   Post op care per surgery Nutrition per general surgery -may have to start TNA in 24h   HEMATOLOGIC A: No acute issues P:   Monitor for bleeding on heparin   INFECTIOUS A: Severe Sepsis due to peritonitis P:   Blood cx: 09/26/14-->  NGTD 12/20 peritoneal fluid >>  Vanc: 09/26/14--> Zosyn:  09/26/14-->   ENDOCRINE A: Hypoglycemia P:  SSI   NEUROLOGIC A: Abd pain Depression Multiple sclerosis P:   RASS goal 0 to  -1 Ct versed Fentanyl gtt Holding home SSRI and benzo in setting of NPO status   Social / Family:  Boyfriend, [name], [place]: [phone] updated 12/20  TODAY'S SUMMARY: Gastric ulcer perforation, peritonitis, unexplained brachial artery clot, both dealt with surgically but now with agitation & RUE ischemia ?  Embolic source  The patient is critically ill with multiple organ systems failure and requires high complexity decision making for assessment and support, frequent evaluation and titration of therapies, application of advanced monitoring technologies and extensive interpretation of multiple databases. Critical Care Time devoted to patient care services described in this note independent of APP time is 35 minutes.    Kara Mead MD. [facility] Flood. [facility] Pager [phone] If no response call [phone]     09/29/2014, 8:53 AM

## 2014-09-29 NOTE — Progress Notes (Signed)
   [facility]  Subjective  - Patient intubated.   Decreasing sedation.   Objective 137/58 86 100.3 F (37.9 C) (Axillary) 24 96%  Intake/Output Summary (Last 24 hours) at 09/29/14 0736 Last data filed at 09/29/14 0700  Gross per 24 hour  Intake 4101.33 ml  Output   1430 ml  Net 2671.33 ml    Palpable radial pulse left hand Right radial and bilateral DP palpable pulses Left leg incisions clean and dry without drainage or hematoma Finger tips dusky, no ulcers Heart NSR, tachycardia     Assessment/Planning: POD # Left Radial, Brachial, and Ulnar Thrombectomy; Left Brachial to Radial Bypass Graft using Greater Saphenous vein graft from Left Thigh; Left Saphenous Vein Harvest; Intraoperative Arteriogram; Intra-arterial administration of TPA (Left) followed by redo.  Intubated and sedated Urine output stable 20-50 cc/hr will decrease IV fluids secondary to edema in extremities to KVO K+ 2.6 ordered replacement 40 meq suspension dose times 1 dose. DVT prophylaxis heparin per pharmacy ex lap for perforated gastric ulcer per general surgery     Laurence Slate [facility] 09/29/2014 7:36 AM --  Laboratory Lab Results:  Recent Labs  09/28/14 0342 09/29/14 0500  WBC 15.5* 11.7*  HGB 12.4 10.9*  HCT 37.9 34.1*  PLT 211 168   BMET  Recent Labs  09/28/14 0342 09/29/14 0500  NA 140 139  K 4.4 2.6*  CL 108 112  CO2 18* 20  GLUCOSE 128* 196*  BUN 11 10  CREATININE 0.76 0.94  CALCIUM 8.1* 6.8*    COAG No results found for: INR, PROTIME No results found for: PTT

## 2014-09-29 NOTE — Progress Notes (Signed)
2 Days Post-Op  Subjective: PT ON VENT OPENS EYES TO STIMULATION  Objective: Vital signs in last 24 hours: Temp:  [99.5 F (37.5 C)-103.1 F (39.5 C)] 100.3 F (37.9 C) (12/22 0500) Pulse Rate:  [78-107] 86 (12/22 0700) Resp:  [23-28] 24 (12/22 0700) BP: (100-192)/(54-107) 137/58 mmHg (12/22 0700) SpO2:  [93 %-96 %] 96 % (12/22 0700) FiO2 (%):  [50 %-60 %] 50 % (12/22 0418) Weight:  [162 lb 0.6 oz (73.5 kg)] 162 lb 0.6 oz (73.5 kg) (12/22 0500)    Intake/Output from previous day: 12/21 0701 - 12/22 0700 In: 4101.3 [I.V.:3761.3; NG/GT:90; IV Piggyback:250] Out: 1430 [Urine:750; Emesis/NG output:600; Drains:80] Intake/Output this shift:    Incision/Wound:OPEN CLEAN DRESSING INTACT  JP  SEROUS NON BILIOUS.   Lab Results:   Recent Labs  09/28/14 0342 09/29/14 0500  WBC 15.5* 11.7*  HGB 12.4 10.9*  HCT 37.9 34.1*  PLT 211 168   BMET  Recent Labs  09/28/14 0342 09/29/14 0500  NA 140 139  K 4.4 2.6*  CL 108 112  CO2 18* 20  GLUCOSE 128* 196*  BUN 11 10  CREATININE 0.76 0.94  CALCIUM 8.1* 6.8*   PT/INR No results for input(s): LABPROT, INR in the last 72 hours. ABG  Recent Labs  09/27/14 0743 09/29/14 0420  PHART 7.341* 7.412  HCO3 19.6* 18.5*    Studies/Results: Ct Angio Up Extrem Left W/cm &/or Wo/cm  09/28/2014   CLINICAL DATA:  Recurrent ischemia in the left upper extremity. Evaluate for a source for embolic disease.  EXAM: CT ANGIOGRAPHY UPPER LEFT EXTREMITY  TECHNIQUE: Multidetector CT imaging of the left upper extremity was performed using the standard protocol during bolus administration of intravenous contrast. Multiplanar CT image reconstructions and MIPS were obtained to evaluate the vascular anatomy. These images were obtained simultaneously with a CTA of the chest.  CONTRAST:  141mL OMNIPAQUE IOHEXOL 350 MG/ML SOLN  COMPARISON:  Chest CTA 09/27/2014  FINDINGS: There is an elongated thrombus involving the proximal left subclavian artery which  originates approximately 2 cm from the left subclavian origin. This thrombus is nonocclusive. Please refer to the chest CTA for further characterization. The left subclavian artery and left axillary artery are patent. The left brachial artery is patent. There is poor flow in the left upper extremity artery at the level of the brachial artery bifurcation. Minimal flow identified in the left forearm. There are postsurgical changes in the forearm consistent with a recent thrombectomy. No significant flow identified in the wrist or hand.  Incidental imaging of the chest, abdomen and pelvis were obtained. The thoracic aorta is patent. The abdominal aorta is patent without aneurysm or dissection. There is flow in the common iliac arteries bilaterally. Flow in the proximal external and internal iliac arteries bilaterally. Diffuse low-attenuation of the liver is compatible with steatosis. Mild distention of the gallbladder. Small to moderate upper abdominal ascites. Patient has an anterior abdominal wound from recent abdominal surgery. There is a surgical drain in the upper abdomen. Nasogastric tube tip in the distal stomach. No gross abnormality to the spleen, pancreas, adrenal glands or kidneys. Probable 1 cm cyst in the left kidney lower pole. Small amount of fluid in the upper pelvis. No acute bone abdomen abnormality.  There is extensive consolidation and volume loss in the lower lobes bilaterally. Trace bilateral pleural effusions. Negative for pneumothorax.  Review of the MIP images confirms the above findings.  IMPRESSION: Nonocclusive thrombus in the proximal left subclavian artery. This could be a  source for embolic disease.  No significant arterial flow in the left forearm or left hand. Occlusion appears to be in the proximal forearm region just beyond the brachial artery bifurcation.  Abdominal ascites.  Postoperative changes in the abdomen.  Consolidation and marked volume loss in lower lobes bilaterally. Small  pleural effusions.   Electronically Signed   By: Markus Daft M.D.   On: 09/28/2014 11:59   Dg Chest Port 1 View  09/29/2014   CLINICAL DATA:  Respiratory failure.  EXAM: PORTABLE CHEST - 1 VIEW  COMPARISON:  09/28/2014.  FINDINGS: Endotracheal tube, NG tube, right IJ line stable position. Mediastinum hilar structures are stable. Bibasilar atelectasis and/or infiltrates again noted. Small left pleural effusion cannot be excluded. No pneumothorax. Heart size normal. No acute osseous abnormality.  IMPRESSION: 1. Lines and tubes in stable position. 2. Stable bibasilar atelectasis and/or infiltrates with small left pleural effusion.   Electronically Signed   By: Marcello Moores  Register   On: 09/29/2014 07:33   Portable Chest Xray  09/28/2014   CLINICAL DATA:  Assess endotracheal tube placement  EXAM: PORTABLE CHEST - 1 VIEW  COMPARISON:  Portable chest x-ray of September 27, 2014  FINDINGS: The lungs are slightly less well inflated today. There is bibasilar subsegmental atelectasis. There is persistent obscuration of the left hemidiaphragm secondary to a small pleural effusion. The bony thorax is unremarkable.  The endotracheal tube tip lies 4.2 cm above the crotch of the carina. The esophagogastric tube tip and proximal port projects below the GE junction. The right internal jugular venous catheter tip projects over the midportion of the SVC.  IMPRESSION: Stable appearance of the chest since yesterday's study. The endotracheal tube is in appropriate position.   Electronically Signed   By: David  Martinique   On: 09/28/2014 07:24   Dg Chest Port 1 View  09/27/2014   CLINICAL DATA:  Hypoxia  EXAM: PORTABLE CHEST - 1 VIEW  COMPARISON:  Study obtained earlier in the day  FINDINGS: Endotracheal tube tip is 1.9 cm above the carina. Nasogastric tube tip is in the stomach with the side port at the gastroesophageal junction. Central catheter tip in superior vena cava. No pneumothorax. There is left lower lobe consolidation with  small left effusion. There is atelectatic change in the medial right base. Elsewhere lungs are clear. Heart size and pulmonary vascularity are normal. No adenopathy. There is postoperative change in the lower cervical spine.  IMPRESSION: Tube and catheter positions as described without pneumothorax. Note that the nasogastric tube side port is essentially at the gastroesophageal junction. There is left lower lobe consolidation with small left effusion. There is mild right base atelectatic change.   Electronically Signed   By: Lowella Grip M.D.   On: 09/27/2014 13:24   Dg Ang/ext/uni/or Left  09/28/2014   CLINICAL DATA:  Left upper extremity arterial occlusion  EXAM: LEFT ANG/EXT/UNI/ OR  COMPARISON:  09/27/2014  FINDINGS: Limited single view intraoperative angiogram performed of the left forearm. This demonstrates patency of the radial artery with mid forearm diffuse radial artery irregularity/ disease. This is nonocclusive. Distal radial artery is patent supplying a portion of the palmar arch and digital branches. Thrombus is suspected in the interosseous artery proximally. Ulnar artery appears to taper to occlusion.  IMPRESSION: Diseased but patent left mid radial artery compatible with residual disease or thrombus. Suspect small amount of nonocclusive thrombus in the interosseous artery proximally.  Ulnar artery tapers to occlusion   Electronically Signed   By: Tomasita Crumble  Shick M.D.   On: 09/28/2014 10:33   Ct Angio Chest Aorta W/cm &/or Wo/cm  09/28/2014   CLINICAL DATA:  48 year old with left upper extremity ischemia. Recurrent ischemia despite surgical thrombectomy.  EXAM: CT ANGIOGRAPHY CHEST WITH CONTRAST  TECHNIQUE: Multidetector CT imaging of the chest was performed using the standard protocol during bolus administration of intravenous contrast. Multiplanar CT image reconstructions and MIPs were obtained to evaluate the vascular anatomy. Images were obtained simultaneously with a CTA of the left  upper extremity.  CONTRAST:  111mL OMNIPAQUE IOHEXOL 350 MG/ML SOLN  COMPARISON:  Chest radiograph 09/27/2014  FINDINGS: Motion artifact at the aortic root. The ascending thoracic aorta measures up to 2.8 cm. The great vessels are patent. There is intraluminal thrombus in the proximal left subclavian artery. Thrombus originates approximately 2 cm from the left subclavian origin. Clot roughly measures 1.3 cm in length. The clot terminates near the origin of the left vertebral artery. This thrombus is nonocclusive. The proximal vertebral arteries are patent bilaterally. The left axillary artery is patent. The left brachial artery is patent. Papillary muscles are prominent in the left ventricle on sequence 2, image 174. Limited evaluation of the cardiac anatomy on this examination.  The main and central pulmonary arteries are patent. Low-attenuation of the liver could represent steatosis. There is upper abdominal ascites around the spleen. Nasogastric tube extends into the stomach. Endotracheal tube is well positioned in the carina.  Small amount of bilateral pleural effusions. There is dependent consolidation in the lower lobes bilaterally. No significant airspace disease in the upper lobes. Probable atelectasis in the lingula. Negative for pneumothorax. No acute bone abnormality.  Review of the MIP images confirms the above findings.  IMPRESSION: Thrombus in the left subclavian artery, originating approximately 2 cm from the origin. This thrombus is nonocclusive but could be a source for embolic disease in the left upper extremity. No clear evidence for a dissection.  The papillary muscles appear to be prominent in the left ventricle. These findings are nonspecific on this examination and recommend correlation with echocardiogram.  There is marked volume loss and consolidation in the lower lobes bilaterally. Trace bilateral pleural effusions.  Abdominal ascites.   Electronically Signed   By: Markus Daft M.D.   On:  09/28/2014 09:54    Anti-infectives: Anti-infectives    Start     Dose/Rate Route Frequency Ordered Stop   09/27/14 2100  ceFEPIme (MAXIPIME) 2 g in dextrose 5 % 50 mL IVPB  Status:  Discontinued     2 g100 mL/hr over 30 Minutes Intravenous Every 24 hours 09/27/14 0603 09/27/14 1103   09/27/14 2000  vancomycin (VANCOCIN) IVPB 1000 mg/200 mL premix  Status:  Discontinued     1,000 mg200 mL/hr over 60 Minutes Intravenous Every 24 hours 09/27/14 0603 09/27/14 1103   09/27/14 1400  vancomycin (VANCOCIN) IVPB 1000 mg/200 mL premix     1,000 mg200 mL/hr over 60 Minutes Intravenous Every 12 hours 09/27/14 1103     09/27/14 1200  ceFEPIme (MAXIPIME) 2 g in dextrose 5 % 50 mL IVPB     2 g100 mL/hr over 30 Minutes Intravenous Every 12 hours 09/27/14 1103     09/26/14 1930  vancomycin (VANCOCIN) IVPB 1000 mg/200 mL premix     1,000 mg200 mL/hr over 60 Minutes Intravenous  Once 09/26/14 1917 09/26/14 2210   09/26/14 1930  ceFEPIme (MAXIPIME) 2 g in dextrose 5 % 50 mL IVPB     2 g100 mL/hr over 30 Minutes Intravenous  Once 09/26/14 1917 09/26/14 2132      Assessment/Plan: s/p closure of perforated gastric ulcer POD3 On vent Continue current care Cont NGT Will need upper GI once 5 days out prior to feeding.   LOS: 3 days    Teren Zurcher A. 09/29/2014

## 2014-09-29 NOTE — Progress Notes (Signed)
INITIAL NUTRITION ASSESSMENT  DOCUMENTATION CODES Per approved criteria  -Obesity Unspecified   INTERVENTION: 1.  Enteral nutrition; if warranted by MD, initiate Vital 1.2 @ 10 mL/hr continuous to provide 240 kcal, 21g protein, and 196 mL free water.  Do not advance.   NUTRITION DIAGNOSIS: Inadequate oral intake related to inability to eat as evidenced by NPO s/p bowel surgery, perotinitis.   Monitor:  1.  Enteral nutrition; Enteral nutrition to provide 60-70% of estimated calorie needs (22-25 kcals/kg ideal body weight) and 100% of estimated protein needs, based on ASPEN guidelines for hypocaloric, high protein feeding in critically ill obese individuals 2.  Wt/wt change; monitor trends  Reason for Assessment: vent  48 y.o. female  Admitting Dx: Pneumoperitoneum  ASSESSMENT: Pt with h/o MS admitted with abdominal pain.  Patient found to have pneumoperitoneum 2/2 to small bowel rupture.  Also with brachial artery clot s/p surgery to repair both.   Pt remains on vent post-op for respiratory failure and peritonitis.  Patient is currently intubated on ventilator support MV: 11.2 L/min Temp (24hrs), Avg:101.2 F (38.4 C), Min:99.5 F (37.5 C), Max:103.1 F (39.5 C)  Propofol: none  Discussed with nursing; still with increased output.  Per surgery documentation this AM, continue NGT to suction and recommends upper GI 5 days out prior to feeding. Patient now POD3.  Nutrition Focused Physical Exam: Subcutaneous Fat:  Orbital Region: WNL Upper Arm Region: WNL Thoracic and Lumbar Region: WNL  Muscle:  Temple Region: WNL Clavicle Bone Region: WNL Clavicle and Acromion Bone Region: WNL Scapular Bone Region: WNL Dorsal Hand: WNL Patellar Region: WNL Anterior Thigh Region: WNL Posterior Calf Region: WNL  Edema: generalized edema   Height: Ht Readings from Last 1 Encounters:  09/27/14 4\' 9"  (1.448 m)    Weight: Wt Readings from Last 1 Encounters:  09/29/14 162 lb 0.6  oz (73.5 kg)    Ideal Body Weight: 92 lbs  % Ideal Body Weight: 176%  Wt Readings from Last 10 Encounters:  09/29/14 162 lb 0.6 oz (73.5 kg)  07/23/14 155 lb 8 oz (70.534 kg)  06/17/14 157 lb (71.215 kg)  04/15/14 159 lb (72.122 kg)  04/01/14 158 lb (71.668 kg)  03/13/14 158 lb (71.668 kg)  12/11/13 154 lb (69.854 kg)  09/12/13 130 lb (58.968 kg)  07/14/13 125 lb (56.7 kg)  05/20/13 125 lb (56.7 kg)    Usual Body Weight: variable, 150 lbs  % Usual Body Weight: 108%  BMI:  Body mass index is 35.06 kg/(m^2).  Estimated Nutritional Needs: Kcal: [phone] Protein: 60-65g Fluid: 1.5 L/day  Skin: surgical incisions  Diet Order: Diet NPO time specified  EDUCATION NEEDS: -Education not appropriate at this time   Intake/Output Summary (Last 24 hours) at 09/29/14 1046 Last data filed at 09/29/14 0900  Gross per 24 hour  Intake 3944.33 ml  Output   1555 ml  Net 2389.33 ml    Last BM: PTA  Labs:   Recent Labs Lab 09/27/14 0600 09/28/14 0342 09/29/14 0500  NA 142 140 139  K 4.5 4.4 2.6*  CL 110 108 112  CO2 20 18* 20  BUN 9 11 10   CREATININE 0.69 0.76 0.94  CALCIUM 6.5* 8.1* 6.8*  GLUCOSE 140* 128* 196*    CBG (last 3)   Recent Labs  09/29/14 0453 09/29/14 0518 09/29/14 0835  GLUCAP 37* 83 79    Scheduled Meds: . antiseptic oral rinse  7 mL Mouth Rinse QID  . ceFEPime (MAXIPIME) IV  2 g  Intravenous Q12H  . chlorhexidine  15 mL Mouth Rinse BID  . fentaNYL  50 mcg Intravenous Once  . nicotine  14 mg Transdermal Daily  . pantoprazole (PROTONIX) IV  40 mg Intravenous Daily  . potassium chloride  10 mEq Intravenous Q1 Hr x 4  . vancomycin  1,000 mg Intravenous Q12H    Continuous Infusions: . dextrose 5 % and 0.9% NaCl 75 mL/hr at 09/29/14 1000  . fentaNYL infusion INTRAVENOUS 250 mcg/hr (09/29/14 0900)  . heparin 1,500 Units/hr (09/29/14 0854)  . midazolam (VERSED) infusion 3 mg/hr (09/29/14 0258)    Past Medical History  Diagnosis Date  .  Anxiety   . Depression   . COPD (chronic obstructive pulmonary disease)   . MS (multiple sclerosis)   . Fibromyalgia   . Impaired fasting glucose   . 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 Eating disorder   . Chronic low back pain   . PONV (postoperative nausea and vomiting)   . Chronic bronchitis     "yearly" (02/11/2013)  . Borderline diabetes   . GERD (gastroesophageal reflux disease)   . Migraine     "I use to" (02/11/2013)  . Arthritis     "hands & feet" (02/11/2013)  . Peripheral neuropathy     [name] 02/11/2013  . B12 deficiency anemia     [name] 02/11/2013  . Chronic pain syndrome     [name] 02/11/2013  . UTI (lower urinary tract infection) 02/11/2013    [name] 02/11/2013  . Chronic edema     BLE/notes 02/11/2013  . Ataxia   . Peripheral neuropathy   . Muscle weakness of lower extremity     bilateral    Past Surgical History  Procedure Laterality Date  . Abdominal hysterectomy  ~ 1987; ~ 2004    "[place]; [place]" (02/11/2013)  . Cesarean section  5277; 1984; 1986  . Anterior cervical decomp/discectomy fusion  2009; 2011  . Tonsillectomy  1990's?  . Cataract extraction w/phaco  06/20/2011    Procedure: CATARACT EXTRACTION PHACO AND INTRAOCULAR LENS PLACEMENT (IOC);  Surgeon: [name] T. Gershon Crane;  Location: [facility];  Service: Ophthalmology;  Laterality: Left;  CDE 1.81  . Cataract extraction w/phaco  07/04/2011    Procedure: CATARACT EXTRACTION PHACO AND INTRAOCULAR LENS PLACEMENT (IOC);  Surgeon: [name] T. Gershon Crane;  Location: [facility];  Service: Ophthalmology;  Laterality: Right;  CDE: 1.76  . Yag laser application Right 06/01/2352    Procedure: YAG LASER APPLICATION;  Surgeon: [name] T. Gershon Crane, MD;  Location: [facility];  Service: Ophthalmology;  Laterality: Right;  . Yag laser application Left 03/22/4314    Procedure: YAG LASER APPLICATION;  Surgeon: [name] T. Gershon Crane, MD;  Location: [facility];  Service: Ophthalmology;  Laterality: Left;  . Appendectomy    .  Laparotomy N/A 09/27/2014    Procedure: Exploratory Laparotomy, Biopsy of Perforated Gastric Ulcer, Closure with omental patch;  Surgeon: [name], MD;  Location: [facility];  Service: General;  Laterality: N/A;  . Embolectomy Left 09/27/2014    Procedure: Left Brachial, Radial,Ulnar Embolectomy with patch angioplasty left brachial, radial and ulnar artery;  Surgeon: [name], MD;  Location: [facility];  Service: Vascular;  Laterality: Left;  . Embolectomy Left 09/27/2014    Procedure: Left Radial, Brachial, and Ulnar Thrombectomy; Left Brachial to Radial Bypass Graft using Greater Saphenous vein graft from Left Thigh; Left Saphenous Vein Harvest; Intraoperative Arteriogram; Intra-arterial administration of TPA;  Surgeon: [name], MD;  Location: [facility];  Service: Vascular;  Laterality: Left;    Brynda Greathouse, MS RD LDN Clinical Inpatient Dietitian Weekend/After hours pager: [phone]

## 2014-09-29 NOTE — Progress Notes (Signed)
CRITICAL VALUE ALERT  Critical value received:  K 2.6  Date of notification:  09/29/14  Time of notification:  0654  Critical value read back:Yes.    Nurse who received alert:  Neomia Dear  MD notified (1st page):  [email or web address]  Time of first page:  0654  MD notified (2nd page):  Time of second page:  Responding MD:  [email or web address]  Time MD responded:  [phone]

## 2014-09-29 NOTE — Progress Notes (Signed)
ANTIBIOTIC CONSULT NOTE - Follow-up  Pharmacy Consult for Vancocin and Maxipime Indication: rule out sepsis  Allergies  Allergen Reactions  . Ambien [Zolpidem Tartrate] Other (See Comments)    Crazy dreams  . Ceftin [Cefuroxime Axetil] Nausea And Vomiting  . Hctz [Hydrochlorothiazide] Other (See Comments)    Urinary retention  . Hydrocodone Nausea Only  . Lodine [Etodolac] Hives  . Promethazine Other (See Comments)    Hallucinations.   . Roxicodone [Oxycodone Hcl] Swelling  . Codeine Rash  . Latex Rash  . Penicillins Rash    Patient Measurements: Height: 4\' 9"  (144.8 cm) Weight: 162 lb 0.6 oz (73.5 kg) IBW/kg (Calculated) : 38.6  Vital Signs: Temp: 102.3 F (39.1 C) (12/22 1200) Temp Source: Oral (12/22 1200) BP: 146/62 mmHg (12/22 1300) Pulse Rate: 91 (12/22 1300) Intake/Output from previous day: 12/21 0701 - 12/22 0700 In: 4131.3 [I.V.:3791.3; NG/GT:90; IV Piggyback:250] Out: 1430 [Urine:750; Emesis/NG output:600; Drains:80] Intake/Output from this shift: Total I/O In: 1028 [I.V.:768; NG/GT:60; IV Piggyback:200] Out: 610 [Urine:260; Emesis/NG output:200; Drains:150]  Labs:  Recent Labs  09/27/14 0600 09/28/14 0342 09/29/14 0500  WBC 22.6* 15.5* 11.7*  HGB 13.7 12.4 10.9*  PLT 265 211 168  CREATININE 0.69 0.76 0.94   Estimated Creatinine Clearance: 60.1 mL/min (by C-G formula based on Cr of 0.94).   Microbiology: Recent Results (from the past 720 hour(s))  Urine culture     Status: None   Collection Time: 09/26/14  7:22 PM  Result Value Ref Range Status   Specimen Description URINE, CATHETERIZED  Final   Special Requests NONE  Final   Culture  Setup Time   Final    09/28/2014 00:50 Performed at [facility] Performed at [facility]   Final   Culture NO GROWTH Performed at [facility]   Final   Report Status 09/29/2014 FINAL  Final  Blood culture (routine x 2)     Status: None (Preliminary  result)   Collection Time: 09/26/14  8:56 PM  Result Value Ref Range Status   Specimen Description BLOOD LEFT ANTECUBITAL DRAWN BY RN  Final   Special Requests BOTTLES DRAWN AEROBIC ONLY 4CC  Final   Culture NO GROWTH 3 DAYS  Final   Report Status PENDING  Incomplete  Blood culture (routine x 2)     Status: None (Preliminary result)   Collection Time: 09/26/14  9:00 PM  Result Value Ref Range Status   Specimen Description BLOOD LEFT ANTECUBITAL DRAWN BY RN  Final   Special Requests BOTTLES DRAWN AEROBIC ONLY 4CC  Final   Culture NO GROWTH 3 DAYS  Final   Report Status PENDING  Incomplete  MRSA PCR Screening     Status: None   Collection Time: 09/26/14 11:30 PM  Result Value Ref Range Status   MRSA by PCR NEGATIVE NEGATIVE Final    Comment:        The GeneXpert MRSA Assay (FDA approved for NASAL specimens only), is one component of a comprehensive MRSA colonization surveillance program. It is not intended to diagnose MRSA infection nor to guide or monitor treatment for MRSA infections.   Body fluid culture     Status: None (Preliminary result)   Collection Time: 09/27/14  1:59 AM  Result Value Ref Range Status   Specimen Description FLUID PERITONEAL  Final   Special Requests ON SWAB  Final   Gram Stain   Final    RARE WBC PRESENT, PREDOMINANTLY PMN  NO ORGANISMS SEEN Performed at [facility]    Culture   Final    NO GROWTH 2 DAYS Performed at [facility]    Report Status PENDING  Incomplete  Anaerobic culture     Status: None (Preliminary result)   Collection Time: 09/27/14  1:59 AM  Result Value Ref Range Status   Specimen Description FLUID PERITONEAL  Final   Special Requests NONE  Final   Gram Stain   Final    FEW WBC PRESENT, PREDOMINANTLY PMN NO SQUAMOUS EPITHELIAL CELLS SEEN NO ORGANISMS SEEN Performed at [facility]    Culture   Final    NO ANAEROBES ISOLATED; CULTURE IN PROGRESS FOR 5 DAYS Performed at [facility]     Report Status PENDING  Incomplete   Assessment: 48yo female c/o abdominal pain w/ N/V, denies abnormal BMs, was sent emergently to OR for suspected perforated small bowel, intraoperatively found instead large perforated ulcer, also w/ elevated SCr thought to be 2/2 underlying GI process. She continues on vancomycin + cefepime for sepsis. Pt with Tmax of 103.1 and WBC 11.7 (trending down). A vancomycin trough was checked today and it was at goal at 17.5.   Vanc 12/19>> Cefepime 12/19>>  12/20 Peritoneal fluid - NGTD 12/19 Blood - NGTD 12/19 Urine -NEG  12/22 VT = 17.5  Goal of Therapy:  Vancomycin trough level 15-20 mcg/ml  Plan:  1. Continue vancomycin 1gm IV Q12H 2. Continue cefepime 2gm IV Q12H 3. F/u renal fxn, C&S, clinical status  Salome Arnt, PharmD, BCPS Pager # [phone] 09/29/2014 1:55 PM

## 2014-09-29 NOTE — Progress Notes (Signed)
ANTICOAGULATION CONSULT NOTE - Follow-up  Pharmacy Consult for heparin Indication: ?arterial clot s/p thrombectomy  Allergies  Allergen Reactions  . Ambien [Zolpidem Tartrate] Other (See Comments)    Crazy dreams  . Ceftin [Cefuroxime Axetil] Nausea And Vomiting  . Hctz [Hydrochlorothiazide] Other (See Comments)    Urinary retention  . Hydrocodone Nausea Only  . Lodine [Etodolac] Hives  . Promethazine Other (See Comments)    Hallucinations.   . Roxicodone [Oxycodone Hcl] Swelling  . Codeine Rash  . Latex Rash  . Penicillins Rash    Patient Measurements: Height: 4\' 9"  (144.8 cm) Weight: 162 lb 0.6 oz (73.5 kg) IBW/kg (Calculated) : 38.6 Heparin Dosing Weight: 70kg  Vital Signs: Temp: 100.3 F (37.9 C) (12/22 0500) Temp Source: Axillary (12/22 0500) BP: 137/58 mmHg (12/22 0700) Pulse Rate: 86 (12/22 0700)  Labs:  Recent Labs  09/26/14 1900  09/27/14 0600 09/28/14 0342 09/28/14 1030 09/28/14 1952 09/29/14 0500  HGB 20.3*  < > 13.7 12.4  --   --  10.9*  HCT 60.4*  < > 43.8 37.9  --   --  34.1*  PLT 504*  --  265 211  --   --  168  HEPARINUNFRC  --   --   --   --  0.14* 0.38 0.27*  CREATININE 1.63*  --  0.69 0.76  --   --  0.94  TROPONINI <0.30  --   --   --   --   --   --   < > = values in this interval not displayed.  Estimated Creatinine Clearance: 60.1 mL/min (by C-G formula based on Cr of 0.94).  Assessment: 48yo female c/o abdominal pain w/ N/V, denies abnormal BMs, was sent emergently to OR for suspected perforated small bowel, intraoperatively found instead large perforated ulcer. Patient now with L hand cyanosis likely from radial arterial thrombus, low likleyhood of PE so he was started on heparin. Heparin level is slightly low at 0.27. No bleeding noted.  Goal of Therapy:  Heparin level 0.3-0.5 units/ml Monitor platelets by anticoagulation protocol: Yes   Plan:  1. Increase heparin gtt to 1500 units/hr 2. Check an 8 hour heparin level  Salome Arnt, PharmD, BCPS Pager # [phone] 09/29/2014 8:14 AM

## 2014-09-29 NOTE — Progress Notes (Signed)
ANTICOAGULATION CONSULT NOTE - Follow-up  Pharmacy Consult for heparin Indication: ?arterial clot s/p thrombectomy  Allergies  Allergen Reactions  . Ambien [Zolpidem Tartrate] Other (See Comments)    Crazy dreams  . Ceftin [Cefuroxime Axetil] Nausea And Vomiting  . Hctz [Hydrochlorothiazide] Other (See Comments)    Urinary retention  . Hydrocodone Nausea Only  . Lodine [Etodolac] Hives  . Promethazine Other (See Comments)    Hallucinations.   . Roxicodone [Oxycodone Hcl] Swelling  . Codeine Rash  . Latex Rash  . Penicillins Rash    Patient Measurements: Height: 4\' 9"  (144.8 cm) Weight: 162 lb 0.6 oz (73.5 kg) IBW/kg (Calculated) : 38.6 Heparin Dosing Weight: 70 kg  Vital Signs: Temp: 99.4 F (37.4 C) (12/22 1700) Temp Source: Oral (12/22 1700) BP: 142/68 mmHg (12/22 1700) Pulse Rate: 98 (12/22 1700)  Labs:  Recent Labs  09/26/14 1900  09/27/14 0600 09/28/14 0342  09/28/14 1952 09/29/14 0500 09/29/14 1630  HGB 20.3*  < > 13.7 12.4  --   --  10.9*  --   HCT 60.4*  < > 43.8 37.9  --   --  34.1*  --   PLT 504*  --  265 211  --   --  168  --   HEPARINUNFRC  --   --   --   --   < > 0.38 0.27* 0.30  CREATININE 1.63*  --  0.69 0.76  --   --  0.94  --   TROPONINI <0.30  --   --   --   --   --   --   --   < > = values in this interval not displayed.  Estimated Creatinine Clearance: 60.1 mL/min (by C-G formula based on Cr of 0.94).  Assessment: 48yo female c/o abdominal pain w/ N/V, denies abnormal BMs, was sent emergently to OR for suspected perforated small bowel, intraoperatively found instead large perforated ulcer. Patient now with L hand cyanosis likely from radial arterial thrombus, low likleyhood of PE so she was started on heparin. TEE pending. Heparin level is therapeutic on low end of range - will increase rate to keep >0.3. No bleeding noted.  Goal of Therapy:  Heparin level 0.3-0.5 units/ml Monitor platelets by anticoagulation protocol: Yes   Plan:  -  Increase heparin drip to 1600 units/hr - 6 hr heparin level - Daily heparin level and CBC - Monitor for s/sx of bleeding  [facility], [place].D., BCPS Clinical Pharmacist Pager: [phone] 09/29/2014 5:48 PM

## 2014-09-30 ENCOUNTER — Inpatient Hospital Stay (HOSPITAL_COMMUNITY): Payer: BLUE CROSS/BLUE SHIELD

## 2014-09-30 ENCOUNTER — Encounter (HOSPITAL_COMMUNITY)
Admission: EM | Disposition: A | Discharge status: Nursing Home (Includes ICF) | Payer: Self-pay | Source: Home / Self Care | Attending: Pulmonary Disease

## 2014-09-30 DIAGNOSIS — Z9889 Other specified postprocedural states: Secondary | ICD-10-CM

## 2014-09-30 DIAGNOSIS — K659 Peritonitis, unspecified: Secondary | ICD-10-CM

## 2014-09-30 DIAGNOSIS — R7889 Finding of other specified substances, not normally found in blood: Secondary | ICD-10-CM

## 2014-09-30 LAB — BASIC METABOLIC PANEL
ANION GAP: 8 (ref 5–15)
Anion gap: 7 (ref 5–15)
BUN: 7 mg/dL (ref 6–23)
BUN: 6 mg/dL (ref 6–23)
CHLORIDE: 109 meq/L (ref 96–112)
CO2: 18 mmol/L — ABNORMAL LOW (ref 19–32)
CO2: 18 mmol/L — ABNORMAL LOW (ref 19–32)
Calcium: 6.6 mg/dL — ABNORMAL LOW (ref 8.4–10.5)
Calcium: 7 mg/dL — ABNORMAL LOW (ref 8.4–10.5)
Chloride: 114 mEq/L — ABNORMAL HIGH (ref 96–112)
Creatinine, Ser: 0.77 mg/dL (ref 0.50–1.10)
Creatinine, Ser: 0.74 mg/dL (ref 0.50–1.10)
GFR calc non Af Amer: >90 mL/min (ref >90)
Glucose, Bld: 100 mg/dL — ABNORMAL HIGH (ref 70–99)
Glucose, Bld: 99 mg/dL (ref 70–99)
POTASSIUM: 3.6 mmol/L (ref 3.5–5.1)
Potassium: 2.8 mmol/L — ABNORMAL LOW (ref 3.5–5.1)
Sodium: 135 mmol/L (ref 135–145)
Sodium: 139 mmol/L (ref 135–145)

## 2014-09-30 LAB — CBC
HCT: 36.3 % (ref 36.0–46.0)
HEMOGLOBIN: 12.3 g/dL (ref 12.0–15.0)
MCH: 32.1 pg (ref 26.0–34.0)
MCHC: 33.9 g/dL (ref 30.0–36.0)
MCV: 94.8 fL (ref 78.0–100.0)
PLATELETS: 134 10*3/uL — AB (ref 150–400)
RBC: 3.83 MIL/uL — ABNORMAL LOW (ref 3.87–5.11)
RDW: 13.4 % (ref 11.5–15.5)
WBC: 11.2 10*3/uL — AB (ref 4.0–10.5)

## 2014-09-30 LAB — GLUCOSE, CAPILLARY
GLUCOSE-CAPILLARY: 88 mg/dL (ref 70–99)
Glucose-Capillary: 112 mg/dL — ABNORMAL HIGH (ref 70–99)

## 2014-09-30 LAB — HEPARIN LEVEL (UNFRACTIONATED)
HEPARIN UNFRACTIONATED: 0.54 [IU]/mL (ref 0.30–0.70)
Heparin Unfractionated: 0.48 IU/mL (ref 0.30–0.70)

## 2014-09-30 SURGERY — ECHOCARDIOGRAM, TRANSESOPHAGEAL
Anesthesia: Moderate Sedation

## 2014-09-30 MED ORDER — POTASSIUM CHLORIDE 10 MEQ/50ML IV SOLN
10.0000 meq | INTRAVENOUS | Status: AC
  Administered 2014-09-30 (×6): 10 meq via INTRAVENOUS
  Filled 2014-09-30 (×6): qty 50

## 2014-09-30 MED ORDER — PNEUMOCOCCAL VAC POLYVALENT 25 MCG/0.5ML IJ INJ
0.5000 mL | INJECTION | INTRAMUSCULAR | Status: AC
  Administered 2014-10-01: 0.5 mL via INTRAMUSCULAR
  Filled 2014-09-30: qty 0.5

## 2014-09-30 MED ORDER — INSULIN ASPART 100 UNIT/ML ~~LOC~~ SOLN
0.0000 [IU] | SUBCUTANEOUS | Status: DC
  Administered 2014-10-01 – 2014-10-02 (×8): 1 [IU] via SUBCUTANEOUS
  Administered 2014-10-03 (×3): 2 [IU] via SUBCUTANEOUS
  Administered 2014-10-04: 1 [IU] via SUBCUTANEOUS
  Administered 2014-10-04: 2 [IU] via SUBCUTANEOUS
  Administered 2014-10-04 – 2014-10-05 (×3): 1 [IU] via SUBCUTANEOUS
  Administered 2014-10-05: 2 [IU] via SUBCUTANEOUS
  Administered 2014-10-05: 1 [IU] via SUBCUTANEOUS
  Administered 2014-10-05: 2 [IU] via SUBCUTANEOUS
  Administered 2014-10-06: 1 [IU] via SUBCUTANEOUS
  Administered 2014-10-06 (×3): 2 [IU] via SUBCUTANEOUS
  Administered 2014-10-06: 3 [IU] via SUBCUTANEOUS
  Administered 2014-10-07 (×2): 2 [IU] via SUBCUTANEOUS
  Administered 2014-10-07: 1 [IU] via SUBCUTANEOUS
  Administered 2014-10-07: 3 [IU] via SUBCUTANEOUS
  Administered 2014-10-07 (×2): 2 [IU] via SUBCUTANEOUS
  Administered 2014-10-08: 3 [IU] via SUBCUTANEOUS
  Administered 2014-10-08: 1 [IU] via SUBCUTANEOUS
  Administered 2014-10-08: 2 [IU] via SUBCUTANEOUS
  Administered 2014-10-08: 3 [IU] via SUBCUTANEOUS
  Administered 2014-10-08: 1 [IU] via SUBCUTANEOUS
  Administered 2014-10-08: 7 [IU] via SUBCUTANEOUS
  Administered 2014-10-09: 1 [IU] via SUBCUTANEOUS
  Administered 2014-10-09: 7 [IU] via SUBCUTANEOUS
  Administered 2014-10-09 (×2): 1 [IU] via SUBCUTANEOUS
  Administered 2014-10-09 – 2014-10-10 (×3): 2 [IU] via SUBCUTANEOUS
  Administered 2014-10-10 (×2): 3 [IU] via SUBCUTANEOUS
  Administered 2014-10-10 (×2): 5 [IU] via SUBCUTANEOUS
  Administered 2014-10-10: 2 [IU] via SUBCUTANEOUS
  Administered 2014-10-10 – 2014-10-11 (×2): 3 [IU] via SUBCUTANEOUS
  Administered 2014-10-11: 5 [IU] via SUBCUTANEOUS
  Administered 2014-10-11: 2 [IU] via SUBCUTANEOUS
  Administered 2014-10-11: 5 [IU] via SUBCUTANEOUS
  Administered 2014-10-11: 2 [IU] via SUBCUTANEOUS
  Administered 2014-10-11: 5 [IU] via SUBCUTANEOUS
  Administered 2014-10-12 (×2): 1 [IU] via SUBCUTANEOUS
  Administered 2014-10-12 (×2): 2 [IU] via SUBCUTANEOUS
  Administered 2014-10-12: 3 [IU] via SUBCUTANEOUS
  Administered 2014-10-13 (×4): 2 [IU] via SUBCUTANEOUS
  Administered 2014-10-13: 1 [IU] via SUBCUTANEOUS
  Administered 2014-10-14 (×3): 2 [IU] via SUBCUTANEOUS
  Administered 2014-10-15: 1 [IU] via SUBCUTANEOUS
  Administered 2014-10-15 (×2): 3 [IU] via SUBCUTANEOUS
  Administered 2014-10-15 – 2014-10-16 (×4): 1 [IU] via SUBCUTANEOUS
  Administered 2014-10-16: 2 [IU] via SUBCUTANEOUS
  Administered 2014-10-16 (×2): 1 [IU] via SUBCUTANEOUS
  Administered 2014-10-16: 2 [IU] via SUBCUTANEOUS
  Administered 2014-10-17 (×2): 1 [IU] via SUBCUTANEOUS
  Administered 2014-10-17: 2 [IU] via SUBCUTANEOUS
  Administered 2014-10-18 (×3): 1 [IU] via SUBCUTANEOUS

## 2014-09-30 MED ORDER — DEXTROSE-NACL 5-0.9 % IV SOLN: INTRAVENOUS | Status: DC

## 2014-09-30 MED ORDER — POTASSIUM CHLORIDE 10 MEQ/50ML IV SOLN
INTRAVENOUS | Status: AC
  Administered 2014-09-30: 10 meq
  Filled 2014-09-30: qty 50

## 2014-09-30 MED ORDER — MIDAZOLAM HCL 5 MG/ML IJ SOLN
INTRAMUSCULAR | Status: AC
  Filled 2014-09-30: qty 2

## 2014-09-30 MED ORDER — FENTANYL CITRATE 0.05 MG/ML IJ SOLN
INTRAMUSCULAR | Status: AC
  Filled 2014-09-30: qty 2

## 2014-09-30 MED ORDER — POTASSIUM CHLORIDE 10 MEQ/50ML IV SOLN: 10.0000 meq | INTRAVENOUS | Status: AC

## 2014-09-30 MED ORDER — SODIUM CHLORIDE 0.9 % IV SOLN
100.0000 mg | INTRAVENOUS | Status: DC
  Administered 2014-09-30 – 2014-10-18 (×19): 100 mg via INTRAVENOUS
  Filled 2014-09-30 (×22): qty 100

## 2014-09-30 MED ORDER — TRACE MINERALS CR-CU-F-FE-I-MN-MO-SE-ZN IV SOLN
INTRAVENOUS | Status: AC
  Administered 2014-09-30: 17:00:00 via INTRAVENOUS
  Filled 2014-09-30: qty 2000

## 2014-09-30 NOTE — Progress Notes (Signed)
PULMONARY / CRITICAL CARE MEDICINE HISTORY AND PHYSICAL EXAMINATION   Name: [name] MRN: [id number] DOB: 07-23-1966    ADMISSION DATE:  09/26/2014  PRIMARY SERVICE: PCCM  CHIEF COMPLAINT:  Abd pain  BRIEF PATIENT DESCRIPTION: 52yof smoker with PMH of MS, depression presents with abd pain, found to have pneumoperitoneum 2/2 small bowel rupture, also with new cold L hand.  Going to OR imminently.    SIGNIFICANT EVENTS / STUDIES:  09/26/14: Admitted, CT Abd with pneumoperitoneum, cold L hand concerning for arterial clot as well. 12/20 > ex-lap [name]) with omental patch for perforated prepyloric gastric ulcer; L brachial and ulnar artery embolectomy [name]) 12/20 LE doppler >>neg 12/20 >>Left brachial to left radial artery bypass with left leg greater saphenous vein for Recurrent occlusion  12/20 Self extubated - had to be reintubated 12/21 echo >>nml LVEF 12/23 TEE ordered:    LINES / TUBES: R IJ CVL 12/20 >> JP drain RUQ 12/20 >> ETT 12/19 >>    SUBJECTIVE: Agitated on WUA. Apneic on SBT while on sedatives   VITAL SIGNS: Temp:  [98.5 F (36.9 C)-101.8 F (38.8 C)] 98.5 F (36.9 C) (12/23 1157) Pulse Rate:  [78-109] 109 (12/23 0900) Resp:  [24] 24 (12/23 0900) BP: (119-154)/(54-83) 137/62 mmHg (12/23 0900) SpO2:  [94 %-100 %] 100 % (12/23 0900) FiO2 (%):  [40 %-50 %] 40 % (12/23 1219) Weight:  [77.1 kg (169 lb 15.6 oz)] 77.1 kg (169 lb 15.6 oz) (12/23 0500) HEMODYNAMICS:    Vent Mode:  [-] PRVC FiO2 (%):  [40 %-50 %] 40 % Set Rate:  [24 bmp] 24 bmp Vt Set:  [450 mL] 450 mL PEEP:  [5 cmH20] 5 cmH20 Plateau Pressure:  [22 cmH20-24 cmH20] 23 cmH20 INTAKE / OUTPUT: Intake/Output      12/22 0701 - 12/23 0700 12/23 0701 - 12/24 0700   I.V. (mL/kg) 3045.1 (39.5) 560 (7.3)   NG/GT 120 60   IV Piggyback 650 250   Total Intake(mL/kg) 3815.1 (49.5) 870 (11.3)   Urine (mL/kg/hr) 940 (0.5) 215 (0.4)   Emesis/NG output 250 (0.1)    Drains 245 (0.1)    Total  Output 1435 215   Net +2380.1 +655          PHYSICAL EXAMINATION:  Gen: RASS -2, not F/C HEENT: WNL PULM: Clear anteriorly CV: Reg, no  AB: absent BS, surgical dressing Ext: warm, no edema Neuro: no focal deficits  LABS: I have reviewed all of today's lab results. Relevant abnormalities are discussed in the A/P section  CXR: bibasilar inf/eff  ASSESSMENT / PLAN:  Principal Problem:   Pneumoperitoneum Active Problems:   Sepsis   Altered mental state   Brachial artery occlusion   Acute respiratory failure with hypoxia   Severe sepsis   Perforated gastric ulcer   PULMONARY A: ETT 12/19 >>  Acute respiratory failure Pulm infiltrates P:   Cont full vent support - settings reviewed and/or adjusted Cont vent bundle Daily SBT if/when meets criteria  CARDIOVASCULAR A: Sinus tachycardia, reactive Hypertension, controlled Multiple thromboemboli P:   Cont labetalol prn Cont heparin gtt TEE ordered  RENAL A: AKI, resolved Hypokalemia P:   Monitor BMET intermittently Monitor I/Os Correct electrolytes as indicated  GASTROINTESTINAL A: Perforated gastric ulcer and pneumoperitoneum  Post laparotomy P:   Post op care per surgery Cont TPN Cont SUP  HEMATOLOGIC A: Very mild thrombocytopenia P:   DVT px: full heparin Monitor CBC intermittently Transfuse per usual ICU guidelines  INFECTIOUS A: Peritonitis  after bowel perforation Severe sepsis P:   Blood cx: 09/26/14-->  NGTD 12/20 peritoneal fluid >> few yeast  Vanc: 12/19 >>  Zosyn:  12/19 >>  Micafungin 12/23 >>   ID following  ENDOCRINE A:  Hypoglycemia, resolved Intermittent hyperglycemia P:   Cont SSI  NEUROLOGIC A: Post op pain ICU associated discomfort P:   RASS goal-1, -2 Cont PAD protocol Holding home SSRI and benzo   Social / Family:   TODAY'S SUMMARY:   CCM time 35 minutes.    Merton Border, MD ; [facility] [phone].  After 5:30 PM or weekends,  call [phone]   09/30/2014, 1:38 PM

## 2014-09-30 NOTE — Progress Notes (Signed)
ANTICOAGULATION CONSULT NOTE - Follow-up  Pharmacy Consult for heparin Indication: ?arterial clot s/p thrombectomy  Allergies  Allergen Reactions  . Ambien [Zolpidem Tartrate] Other (See Comments)    Crazy dreams  . Ceftin [Cefuroxime Axetil] Nausea And Vomiting  . Hctz [Hydrochlorothiazide] Other (See Comments)    Urinary retention  . Hydrocodone Nausea Only  . Lodine [Etodolac] Hives  . Promethazine Other (See Comments)    Hallucinations.   . Roxicodone [Oxycodone Hcl] Swelling  . Codeine Rash  . Latex Rash  . Penicillins Rash    Patient Measurements: Height: 4\' 9"  (144.8 cm) Weight: 169 lb 15.6 oz (77.1 kg) IBW/kg (Calculated) : 38.6 Heparin Dosing Weight: 70 kg  Vital Signs: Temp: 98.9 F (37.2 C) (12/23 0727) Temp Source: Axillary (12/23 0727) BP: 119/56 mmHg (12/23 0700) Pulse Rate: 81 (12/23 0700)  Labs:  Recent Labs  09/28/14 0342  09/29/14 0500 09/29/14 1630 09/30/14 0024 09/30/14 0500 09/30/14 0551  HGB 12.4  --  10.9*  --   --  12.3  --   HCT 37.9  --  34.1*  --   --  36.3  --   PLT 211  --  168  --   --  134*  --   HEPARINUNFRC  --   < > 0.27* 0.30 0.48  --  0.54  CREATININE 0.76  --  0.94  --   --  0.77  --   < > = values in this interval not displayed.  Estimated Creatinine Clearance: 72.5 mL/min (by C-G formula based on Cr of 0.77).  Assessment: 48yo female c/o abdominal pain w/ N/V, denies abnormal BMs, was sent emergently to OR for suspected perforated small bowel, intraoperatively found instead large perforated ulcer. Patient now with L hand cyanosis likely from radial arterial thrombus, low likleyhood of PE so she was started on heparin. TEE pending. Heparin level is slightly above lower heparin level goal s/p surgery. However, it is 3 days out from surgery and heparin level minimally elevated so likely ok to continue current rate. No bleeding noted. H/H improved from yesterday but platelets are trending down slightly, will need to monitor  closely.   Goal of Therapy:  Heparin level 0.3-0.5 units/ml Monitor platelets by anticoagulation protocol: Yes   Plan:  1. Continue heparin gtt 1600 units/hr - will need to adjust if further increases in heparin levels 2. Continue daily heparin level and CBC  Salome Arnt, PharmD, BCPS Pager # [phone] 09/30/2014 7:49 AM

## 2014-09-30 NOTE — Progress Notes (Addendum)
PARENTERAL NUTRITION CONSULT NOTE - INITIAL  Pharmacy Consult for TPN Indication: S/P exp lap for perf gastric ulcer  Allergies  Allergen Reactions  . Ambien [Zolpidem Tartrate] Other (See Comments)    Crazy dreams  . Ceftin [Cefuroxime Axetil] Nausea And Vomiting  . Hctz [Hydrochlorothiazide] Other (See Comments)    Urinary retention  . Hydrocodone Nausea Only  . Lodine [Etodolac] Hives  . Promethazine Other (See Comments)    Hallucinations.   . Roxicodone [Oxycodone Hcl] Swelling  . Codeine Rash  . Latex Rash  . Penicillins Rash    Patient Measurements: Height: 4\' 9"  (144.8 cm) Weight: 169 lb 15.6 oz (77.1 kg) IBW/kg (Calculated) : 38.6 Adjusted Body Weight:  Usual Weight:   Vital Signs: Temp: 98.9 F (37.2 C) (12/23 0727) Temp Source: Axillary (12/23 0727) BP: 137/62 mmHg (12/23 0900) Pulse Rate: 109 (12/23 0900) Intake/Output from previous day: 12/22 0701 - 12/23 0700 In: 3815.1 [I.V.:3045.1; NG/GT:120; IV Piggyback:650] Out: 1435 [Urine:940; Emesis/NG output:250; Drains:245] Intake/Output from this shift: Total I/O In: 333 [I.V.:253; NG/GT:30; IV Piggyback:50] Out: 130 [Urine:130]  Labs:  Recent Labs  09/28/14 0342 09/29/14 0500 09/30/14 0500  WBC 15.5* 11.7* 11.2*  HGB 12.4 10.9* 12.3  HCT 37.9 34.1* 36.3  PLT 211 168 134*     Recent Labs  09/28/14 0342 09/29/14 0500 09/30/14 0500  NA 140 139 135  K 4.4 2.6* 2.8*  CL 108 112 109  CO2 18* 20 18*  GLUCOSE 128* 196* 100*  BUN 11 10 7   CREATININE 0.76 0.94 0.77  CALCIUM 8.1* 6.8* 6.6*   Estimated Creatinine Clearance: 72.5 mL/min (by C-G formula based on Cr of 0.77).    Recent Labs  09/29/14 0453 09/29/14 0518 09/29/14 0835  GLUCAP 37* 83 44    Medical History: Past Medical History  Diagnosis Date  . Anxiety   . Depression   . COPD (chronic obstructive pulmonary disease)   . MS (multiple sclerosis)   . Fibromyalgia   . Impaired fasting glucose   . 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 Eating disorder   . Chronic low back pain   . PONV (postoperative nausea and vomiting)   . Chronic bronchitis     "yearly" (02/11/2013)  . Borderline diabetes   . GERD (gastroesophageal reflux disease)   . Migraine     "I use to" (02/11/2013)  . Arthritis     "hands & feet" (02/11/2013)  . Peripheral neuropathy     [name] 02/11/2013  . B12 deficiency anemia     [name] 02/11/2013  . Chronic pain syndrome     [name] 02/11/2013  . UTI (lower urinary tract infection) 02/11/2013    [name] 02/11/2013  . Chronic edema     BLE/notes 02/11/2013  . Ataxia   . Peripheral neuropathy   . Muscle weakness of lower extremity     bilateral    Medications:  Scheduled:  . antiseptic oral rinse  7 mL Mouth Rinse QID  . ceFEPime (MAXIPIME) IV  2 g Intravenous Q12H  . chlorhexidine  15 mL Mouth Rinse BID  . nicotine  14 mg Transdermal Daily  . pantoprazole (PROTONIX) IV  40 mg Intravenous Daily  . [START ON 10/01/2014] pneumococcal 23 valent vaccine  0.5 mL Intramuscular Tomorrow-1000  . potassium chloride  10 mEq Intravenous Q1H  . vancomycin  1,000 mg Intravenous Q12H    Admit:  Admitted 12/20 with abd pain with vomiting/fevers.  (+)pneumoperitoneum 2nd SB rupture.  Also,  new cool L-hand & poss arterial clot  Insulin Requirements: No insulin  Nutritional Requirements: [phone] Kcal with 60-65g protein per RD recommendations  Current Nutrition: Currently NPO with plan for UGI at least POD#5 (today is POD#4) prior to feeding.  Assessment: NGT remains in place, to start TPN.   GI:  S/P exp lap/oversew of perforated pre-pyloric gastric ulcer; omental patch.  (+)Hx GERD.  NGT with 254mL out and JP drain with 259mL out yesterday.  PPI-IV daily  Endo:  Borderline DM.  Serum gluc 100 this AM.  Lytes:  K 2.8- 8 runs ordered this AM, Cr < 1  Renal:  Cr 0.77, UOP 0.67ml/kg/hr yesterday.  Pulm:  Hx COPD, chronic bronchitis.  Acute resp failure,  intubated  Cards:  S/P Ulnar artery embolectomy 12/20, on Heparin IV, no pressors.    Hepatobil:  LFTs wnl on 12/19.  Neuro:  Hx MS, depression, periph neuropathy.  Minimally arousable.  Nicotine patch, Fent, Midazolam infusions  ID:   Cefepime & Vancomycin (12/20 >> ) for sepsis.  Tm 102.3, WBC 11.2.  All cultures are ntd.  Best Practices: mouthcare, SCDs TPN Access:  R-IJ CVL TPN day#:  12/23 >>  Plan: Clinimix-E 5/15 at 74ml/hr No lipids at this time as will overfeed TPN labs every Mon/Thurs Sensitive SSI q4  Gracy Bruins, PharmD Clinical Pharmacist [facility]

## 2014-09-30 NOTE — Progress Notes (Addendum)
   [facility]  Subjective  -Intubated  Objective 119/56 81 98.9 F (37.2 C) (Axillary) 24 94%  Intake/Output Summary (Last 24 hours) at 09/30/14 6812 Last data filed at 09/30/14 0700  Gross per 24 hour  Intake 3815.13 ml  Output   1435 ml  Net 2380.13 ml    Palpable bilateral DP, feet warm well perfused  AROM of both feet Restraints in place, incisions intact left arm Heart tachycardia Right hand finger pads appearance better, left nail beds and tips dark no open wounds  Assessment/Planning: S/P Left Radial, Brachial, and Ulnar Thrombectomy; Left Brachial to Radial Bypass Graft using Greater Saphenous vein graft from Left Thigh; Left Saphenous Vein Harvest; Intraoperative Arteriogram; Intra-arterial administration of TPA (Left) followed by redo. Hypokalemia K+ replacement ordered WBC 11.2 IV vancomycin Heparin IV Right hand fingers pinker coloration today She moves her toes, ankles are in plantar flex I will order ankle support for foot drop support. Urine output 40 cc per hour Cr stable 0.77    COLLINS, EMMA MAUREEN 09/30/2014 7:28 AM --  Laboratory Lab Results:  Recent Labs  09/29/14 0500 09/30/14 0500  WBC 11.7* 11.2*  HGB 10.9* 12.3  HCT 34.1* 36.3  PLT 168 134*   BMET  Recent Labs  09/29/14 0500 09/30/14 0500  NA 139 135  K 2.6* 2.8*  CL 112 109  CO2 20 18*  GLUCOSE 196* 100*  BUN 10 7  CREATININE 0.94 0.77  CALCIUM 6.8* 6.6*    COAG No results found for: INR, PROTIME No results found for: PTT    I agree with the above.  The patient has a palpable left radial artery bypass graft pulse.  The ischemic changes to her fingers are improved on the left.  She has a palpable right radial pulse.  The ischemic changes to the fingers on the right have improved.  Continue with IV heparin.  Annamarie Major

## 2014-09-30 NOTE — Progress Notes (Signed)
Orthopedic Tech Progress Note Patient Details:  [name] Mar 02, 1966 [id number]  Patient ID: [name], female   DOB: 1966/01/12, 48 y.o.   MRN: [id number] Viewed order from RN order list  [name] 09/30/2014, 9:57 AM

## 2014-09-30 NOTE — Consult Note (Signed)
[name] for Infectious Disease     Reason for Consult: Yeast in blood    Referring Physician: Dr. Abelina Bachelor  Principal Problem:   Pneumoperitoneum Active Problems:   Sepsis   Altered mental state   Brachial artery occlusion   Acute respiratory failure with hypoxia   Severe sepsis   Perforated gastric ulcer   . antiseptic oral rinse  7 mL Mouth Rinse QID  . ceFEPime (MAXIPIME) IV  2 g Intravenous Q12H  . chlorhexidine  15 mL Mouth Rinse BID  . insulin aspart  0-9 Units Subcutaneous 6 times per day  . micafungin [facility]) IV  100 mg Intravenous Daily  . nicotine  14 mg Transdermal Daily  . pantoprazole (PROTONIX) IV  40 mg Intravenous Daily  . [START ON 10/01/2014] pneumococcal 23 valent vaccine  0.5 mL Intramuscular Tomorrow-1000  . potassium chloride  10 mEq Intravenous Q1H  . vancomycin  1,000 mg Intravenous Q12H    Recommendations: micafungin   Assessment: She has yeast in her blood, 1/2 and is s/p abdominal perforation  Antibiotics: Vancomycin and cefepime  HPI: [name] is a 48 y.o. female with history of prolonged ICU stay from pneumonia, history trach and PEG who presented with 1 day of abdominal pain and found pneumoperitoneum on CT and in OR had perforated prepyloric gastric ulcer.  Also with clot in hand requiring thrombectomy in left brachial, radial and ulnar arteries.  Has remained intubated on vent.  Has been on empiric vancomycin and cefepime and now blood cultures from 12/19 with 1/2 with yeast.     Review of Systems: Review of systems not obtained due to patient factors.  Past Medical History  Diagnosis Date  . Anxiety   . Depression   . COPD (chronic obstructive pulmonary disease)   . MS (multiple sclerosis)   . Fibromyalgia   . Impaired fasting glucose   . 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 Eating disorder   . Chronic low back pain   . PONV (postoperative nausea and vomiting)   .  Chronic bronchitis     "yearly" (02/11/2013)  . Borderline diabetes   . GERD (gastroesophageal reflux disease)   . Migraine     "I use to" (02/11/2013)  . Arthritis     "hands & feet" (02/11/2013)  . Peripheral neuropathy     [name] 02/11/2013  . B12 deficiency anemia     [name] 02/11/2013  . Chronic pain syndrome     [name] 02/11/2013  . UTI (lower urinary tract infection) 02/11/2013    [name] 02/11/2013  . Chronic edema     BLE/notes 02/11/2013  . Ataxia   . Peripheral neuropathy   . Muscle weakness of lower extremity     bilateral    History  Substance Use Topics  . Smoking status: Current Every Day Smoker -- 1.00 packs/day for 33 years    Types: Cigarettes  . Smokeless tobacco: Former Systems developer     Comment: 02/11/2013 offered smoking cessation materials; pt declines  . Alcohol Use: No    Family History  Problem Relation Age of Onset  . Anesthesia problems Neg Hx   . Hypotension Neg Hx   . Malignant hyperthermia Neg Hx   . Pseudochol deficiency Neg Hx   . Hyperlipidemia Mother   . Heart disease Mother   . Cancer Mother     lung  . Diabetes Father   . Heart disease Father   . Cancer Maternal Grandmother  breast   Allergies  Allergen Reactions  . Ambien [Zolpidem Tartrate] Other (See Comments)    Crazy dreams  . Ceftin [Cefuroxime Axetil] Nausea And Vomiting  . Hctz [Hydrochlorothiazide] Other (See Comments)    Urinary retention  . Hydrocodone Nausea Only  . Lodine [Etodolac] Hives  . Promethazine Other (See Comments)    Hallucinations.   . Roxicodone [Oxycodone Hcl] Swelling  . Codeine Rash  . Latex Rash  . Penicillins Rash    OBJECTIVE: Blood pressure 137/62, pulse 109, temperature 98.5 F (36.9 C), temperature source Oral, resp. rate 24, height 4\' 9"  (1.448 m), weight 169 lb 15.6 oz (77.1 kg), SpO2 100 %. General: intubated, sedated. Skin: no rashes Lungs: diffuse rhonchi Cor: tachy rr Abdomen: post op Ext: some edema  Microbiology: Recent Results (from the  past 240 hour(s))  Urine culture     Status: None   Collection Time: 09/26/14  7:22 PM  Result Value Ref Range Status   Specimen Description URINE, CATHETERIZED  Final   Special Requests NONE  Final   Culture  Setup Time   Final    09/28/2014 00:50 Performed at [place] Performed at [facility]   Final   Culture NO GROWTH Performed at [facility]   Final   Report Status 09/29/2014 FINAL  Final  Blood culture (routine x 2)     Status: None (Preliminary result)   Collection Time: 09/26/14  8:56 PM  Result Value Ref Range Status   Specimen Description BLOOD LEFT ANTECUBITAL DRAWN BY RN  Final   Special Requests BOTTLES DRAWN AEROBIC ONLY 4CC  Final   Culture   Final    YEAST Gram Stain Report Called to,Read Back By and Verified With: Theodosia Quay [facility]) AT 1140 ON 09/30/2014 BY BAUGHAM,M. Performed at [facility]    Report Status PENDING  Incomplete  Blood culture (routine x 2)     Status: None (Preliminary result)   Collection Time: 09/26/14  9:00 PM  Result Value Ref Range Status   Specimen Description BLOOD LEFT ANTECUBITAL DRAWN BY RN  Final   Special Requests BOTTLES DRAWN AEROBIC ONLY 4CC  Final   Culture NO GROWTH 4 DAYS  Final   Report Status PENDING  Incomplete  MRSA PCR Screening     Status: None   Collection Time: 09/26/14 11:30 PM  Result Value Ref Range Status   MRSA by PCR NEGATIVE NEGATIVE Final    Comment:        The GeneXpert MRSA Assay (FDA approved for NASAL specimens only), is one component of a comprehensive MRSA colonization surveillance program. It is not intended to diagnose MRSA infection nor to guide or monitor treatment for MRSA infections.   Body fluid culture     Status: None (Preliminary result)   Collection Time: 09/27/14  1:59 AM  Result Value Ref Range Status   Specimen Description FLUID PERITONEAL  Final   Special Requests ON SWAB  Final   Gram Stain   Final    RARE  WBC PRESENT, PREDOMINANTLY PMN NO ORGANISMS SEEN Performed at [facility]    Culture   Final    FEW YEAST Note: CRITICAL RESULT CALLED TO, READ BACK BY AND VERIFIED WITH: BEVON BY [facility] 12/23 12N Performed at [facility]    Report Status PENDING  Incomplete  Anaerobic culture     Status: None (Preliminary result)   Collection Time: 09/27/14  1:59 AM  Result Value Ref Range Status   Specimen Description FLUID PERITONEAL  Final   Special Requests NONE  Final   Gram Stain   Final    FEW WBC PRESENT, PREDOMINANTLY PMN NO SQUAMOUS EPITHELIAL CELLS SEEN NO ORGANISMS SEEN Performed at [facility]    Culture   Final    NO ANAEROBES ISOLATED; CULTURE IN PROGRESS FOR 5 DAYS Performed at [facility]    Report Status PENDING  Incomplete    [place], [place] [facility] [email or web address] [phone] pager  [phone] cell 09/30/2014, 12:13 PM

## 2014-09-30 NOTE — Progress Notes (Signed)
Orthopedic Tech Progress Note Patient Details:  [name] September 11, 1966 [id number]  Ortho Devices Type of Ortho Device:  (foot drpo boot) Ortho Device/Splint Location: rle Ortho Device/Splint Interventions: Application  Order is for bilateral application; however RN stated that for skin issues  The boot will be alternated from one foot to the other; therefore only one boot was provided to accomplish this Hildred Priest 09/30/2014, 9:54 AM

## 2014-09-30 NOTE — Progress Notes (Signed)
3 Days Post-Op  Subjective: Intubated minimally arousable  Objective: Vital signs in last 24 hours: Temp:  [98.9 F (37.2 C)-102.3 F (39.1 C)] 98.9 F (37.2 C) (12/23 0727) Pulse Rate:  [78-109] 109 (12/23 0900) Resp:  [24] 24 (12/23 0900) BP: (119-162)/(54-83) 137/62 mmHg (12/23 0900) SpO2:  [94 %-100 %] 100 % (12/23 0900) FiO2 (%):  [40 %-50 %] 40 % (12/23 0815) Weight:  [169 lb 15.6 oz (77.1 kg)] 169 lb 15.6 oz (77.1 kg) (12/23 0500)    Intake/Output from previous day: 12/22 0701 - 12/23 0700 In: 3815.1 [I.V.:3045.1; NG/GT:120; IV Piggyback:650] Out: 1435 [Urine:940; Emesis/NG output:250; Drains:245] Intake/Output this shift: Total I/O In: 333 [I.V.:253; NG/GT:30; IV Piggyback:50] Out: 130 [Urine:130]  Incision/Wound:clean open.  JP srous SOFT ABDOMEN  Lab Results:   Recent Labs  09/29/14 0500 09/30/14 0500  WBC 11.7* 11.2*  HGB 10.9* 12.3  HCT 34.1* 36.3  PLT 168 134*   BMET  Recent Labs  09/29/14 0500 09/30/14 0500  NA 139 135  K 2.6* 2.8*  CL 112 109  CO2 20 18*  GLUCOSE 196* 100*  BUN 10 7  CREATININE 0.94 0.77  CALCIUM 6.8* 6.6*   PT/INR No results for input(s): LABPROT, INR in the last 72 hours. ABG  Recent Labs  09/29/14 0420  PHART 7.412  HCO3 18.5*    Studies/Results: Dg Chest Port 1 View  09/30/2014   CLINICAL DATA:  Respiratory failure.  EXAM: PORTABLE CHEST - 1 VIEW  COMPARISON:  02/27/2014.  FINDINGS: Endotracheal tube, right IJ line, NG tube in good anatomic position. Mediastinal structures normal. Heart size normal. Interim slight progression of bibasilar pulmonary infiltrates. Bibasilar subsegmental atelectasis. No pneumothorax. Small pleural effusions cannot be excluded.  IMPRESSION: 1. Lines and tubes in stable position. 2. Interim progression of bibasilar infiltrates. Subsegmental atelectasis both lung bases. Associated tiny bilateral pleural effusions cannot be excluded . 3. Heart size stable.  Pulmonary vascularity normal.    Electronically Signed   By: Reese   On: 09/30/2014 07:57   Dg Chest Port 1 View  09/29/2014   CLINICAL DATA:  Respiratory failure.  EXAM: PORTABLE CHEST - 1 VIEW  COMPARISON:  09/28/2014.  FINDINGS: Endotracheal tube, NG tube, right IJ line stable position. Mediastinum hilar structures are stable. Bibasilar atelectasis and/or infiltrates again noted. Small left pleural effusion cannot be excluded. No pneumothorax. Heart size normal. No acute osseous abnormality.  IMPRESSION: 1. Lines and tubes in stable position. 2. Stable bibasilar atelectasis and/or infiltrates with small left pleural effusion.   Electronically Signed   By: Marcello Moores  Register   On: 09/29/2014 07:33    Anti-infectives: Anti-infectives    Start     Dose/Rate Route Frequency Ordered Stop   09/27/14 2100  ceFEPIme (MAXIPIME) 2 g in dextrose 5 % 50 mL IVPB  Status:  Discontinued     2 g100 mL/hr over 30 Minutes Intravenous Every 24 hours 09/27/14 0603 09/27/14 1103   09/27/14 2000  vancomycin (VANCOCIN) IVPB 1000 mg/200 mL premix  Status:  Discontinued     1,000 mg200 mL/hr over 60 Minutes Intravenous Every 24 hours 09/27/14 0603 09/27/14 1103   09/27/14 1400  vancomycin (VANCOCIN) IVPB 1000 mg/200 mL premix     1,000 mg200 mL/hr over 60 Minutes Intravenous Every 12 hours 09/27/14 1103     09/27/14 1200  ceFEPIme (MAXIPIME) 2 g in dextrose 5 % 50 mL IVPB     2 g100 mL/hr over 30 Minutes Intravenous Every 12 hours 09/27/14 1103  09/26/14 1930  vancomycin (VANCOCIN) IVPB 1000 mg/200 mL premix     1,000 mg200 mL/hr over 60 Minutes Intravenous  Once 09/26/14 1917 09/26/14 2210   09/26/14 1930  ceFEPIme (MAXIPIME) 2 g in dextrose 5 % 50 mL IVPB     2 g100 mL/hr over 30 Minutes Intravenous  Once 09/26/14 1917 09/26/14 2132      Assessment/Plan: s/p repair gastric ulcer Patient Active Problem List   Diagnosis Date Noted  . 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 Acute respiratory failure with hypoxia 09/27/2014  .  Severe sepsis 09/27/2014  . Perforated gastric ulcer 09/27/2014  . Sepsis 09/26/2014  . Altered mental state 09/26/2014  . Pneumoperitoneum 09/26/2014  . Prediabetes 07/23/2014  . Unspecified hereditary and idiopathic peripheral neuropathy 04/15/2014  . Overdose 04/02/2014  . CVA (cerebral infarction) 04/02/2014  . Leukocytosis 04/02/2014  . Hypertriglyceridemia 12/11/2013  . Stiffness of joint, not elsewhere classified, ankle and foot 11/12/2013  . Lack of coordination 11/03/2013  . Pernicious anemia 09/12/2013  . Hyperglycemia 09/12/2013  . Elevated transaminase level 09/12/2013  . Weakness 09/12/2013  . Ataxia 09/09/2013  . Difficulty in walking(719.7) 09/09/2013  . Weakness of both legs 09/09/2013  . UTI (urinary tract infection) 02/11/2013  . Generalized weakness 02/11/2013  . Tobacco abuse 01/14/2013  . Bilateral leg edema 01/14/2013  . Pedal edema 01/13/2013  . Chronic pain syndrome 12/30/2012  . Reactive airway disease 12/30/2012  . Encephalopathy acute 07/25/2011  . 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   Will need TNA Cont NGT for now.   Continue supportive care  LOS: 4 days    Concettina Leth A. 09/30/2014

## 2014-09-30 NOTE — Progress Notes (Signed)
ANTICOAGULATION CONSULT NOTE - Follow Up Consult  Pharmacy Consult for heparin Indication: s/p thrombectomy   Labs:  Recent Labs  09/27/14 0600 09/28/14 0342  09/29/14 0500 09/29/14 1630 09/30/14 0024  HGB 13.7 12.4  --  10.9*  --   --   HCT 43.8 37.9  --  34.1*  --   --   PLT 265 211  --  168  --   --   HEPARINUNFRC  --   --   < > 0.27* 0.30 0.48  CREATININE 0.69 0.76  --  0.94  --   --   < > = values in this interval not displayed.    Assessment/Plan:  48yo female therapeutic on heparin after rate increase. Will continue gtt at current rate and confirm stable with am labs.   Wynona Neat, PharmD, BCPS  09/30/2014,1:05 AM

## 2014-09-30 NOTE — Plan of Care (Signed)
Problem: Phase I Progression Outcomes Goal: Patient tolerating nututrition at goal Outcome: Progressing Started TNA today

## 2014-09-30 NOTE — Progress Notes (Signed)
[facility] ADULT ICU REPLACEMENT PROTOCOL FOR AM LAB REPLACEMENT ONLY  The patient does apply for the [facility] Adult ICU Electrolyte Replacment Protocol based on the criteria listed below:   1. Is GFR >/= 40 ml/min? Yes.    Patient's GFR today is >90 2. Is urine output >/= 0.5 ml/kg/hr for the last 6 hours? Yes.   Patient's UOP is 0.5 ml/kg/hr 3. Is BUN < 60 mg/dL? Yes.    Patient's BUN today is 7 4. Abnormal electrolyte(s): K2.8 5. Ordered repletion with: KCL /IV 6. If a panic level lab has been reported, has the CCM MD in charge been notified? Yes.  .   Physician:  Lupita Shutter 09/30/2014 6:47 AM

## 2014-10-01 DIAGNOSIS — R918 Other nonspecific abnormal finding of lung field: Secondary | ICD-10-CM

## 2014-10-01 LAB — COMPREHENSIVE METABOLIC PANEL
ALT: 30 U/L (ref 0–35)
ANION GAP: 6 (ref 5–15)
AST: 49 U/L — ABNORMAL HIGH (ref 0–37)
Albumin: 1.6 g/dL — ABNORMAL LOW (ref 3.5–5.2)
Alkaline Phosphatase: 73 U/L (ref 39–117)
BUN: <5 mg/dL — ABNORMAL LOW (ref 6–23)
CALCIUM: 7.2 mg/dL — AB (ref 8.4–10.5)
CO2: 20 mmol/L (ref 19–32)
CREATININE: 0.74 mg/dL (ref 0.50–1.10)
Chloride: 113 mEq/L — ABNORMAL HIGH (ref 96–112)
GFR calc Af Amer: >90 mL/min (ref >90)
Glucose, Bld: 109 mg/dL — ABNORMAL HIGH (ref 70–99)
Potassium: 3.1 mmol/L — ABNORMAL LOW (ref 3.5–5.1)
Sodium: 139 mmol/L (ref 135–145)
TOTAL PROTEIN: 4.8 g/dL — AB (ref 6.0–8.3)
Total Bilirubin: 1 mg/dL (ref 0.3–1.2)

## 2014-10-01 LAB — CBC
HEMATOCRIT: 34.2 % — AB (ref 36.0–46.0)
Hemoglobin: 11.2 g/dL — ABNORMAL LOW (ref 12.0–15.0)
MCH: 32.2 pg (ref 26.0–34.0)
MCHC: 32.7 g/dL (ref 30.0–36.0)
MCV: 98.3 fL (ref 78.0–100.0)
PLATELETS: 186 10*3/uL (ref 150–400)
RBC: 3.48 MIL/uL — ABNORMAL LOW (ref 3.87–5.11)
RDW: 13.7 % (ref 11.5–15.5)
WBC: 16.2 10*3/uL — ABNORMAL HIGH (ref 4.0–10.5)

## 2014-10-01 LAB — GLUCOSE, CAPILLARY
GLUCOSE-CAPILLARY: 111 mg/dL — AB (ref 70–99)
GLUCOSE-CAPILLARY: 40 mg/dL — AB (ref 70–99)
GLUCOSE-CAPILLARY: 143 mg/dL — AB (ref 70–99)
GLUCOSE-CAPILLARY: 135 mg/dL — AB (ref 70–99)
Glucose-Capillary: 82 mg/dL (ref 70–99)
Glucose-Capillary: 130 mg/dL — ABNORMAL HIGH (ref 70–99)
Glucose-Capillary: 138 mg/dL — ABNORMAL HIGH (ref 70–99)
Glucose-Capillary: 117 mg/dL — ABNORMAL HIGH (ref 70–99)

## 2014-10-01 LAB — MAGNESIUM: MAGNESIUM: 1.4 mg/dL — AB (ref 1.5–2.5)

## 2014-10-01 LAB — PHOSPHORUS: Phosphorus: 2.4 mg/dL (ref 2.3–4.6)

## 2014-10-01 LAB — PREALBUMIN: Prealbumin: 3.6 mg/dL — ABNORMAL LOW (ref 17.0–34.0)

## 2014-10-01 LAB — HEPARIN LEVEL (UNFRACTIONATED): Heparin Unfractionated: 0.38 IU/mL (ref 0.30–0.70)

## 2014-10-01 MED ORDER — DEXTROSE-NACL 5-0.9 % IV SOLN: INTRAVENOUS | Status: DC

## 2014-10-01 MED ORDER — TRACE MINERALS CR-CU-F-FE-I-MN-MO-SE-ZN IV SOLN
INTRAVENOUS | Status: AC
  Administered 2014-10-01: 17:00:00 via INTRAVENOUS
  Filled 2014-10-01: qty 2000

## 2014-10-01 MED ORDER — MAGNESIUM SULFATE 2 GM/50ML IV SOLN
2.0000 g | Freq: Once | INTRAVENOUS | Status: AC
  Administered 2014-10-01: 2 g via INTRAVENOUS
  Filled 2014-10-01: qty 50

## 2014-10-01 MED ORDER — POTASSIUM CHLORIDE 10 MEQ/50ML IV SOLN
10.0000 meq | INTRAVENOUS | Status: AC
  Administered 2014-10-01 (×4): 10 meq via INTRAVENOUS
  Filled 2014-10-01 (×4): qty 50

## 2014-10-01 NOTE — Progress Notes (Signed)
4 Days Post-Op  Subjective: Nurse reports thick secretions.   Objective: Vital signs in last 24 hours: Temp:  [98.5 F (36.9 C)-99.5 F (37.5 C)] 98.9 F (37.2 C) (12/24 0400) Pulse Rate:  [80-120] 106 (12/24 0700) Resp:  [14-25] 15 (12/24 0700) BP: (108-169)/(50-112) 159/85 mmHg (12/24 0700) SpO2:  [90 %-100 %] 98 % (12/24 0700) FiO2 (%):  [40 %] 40 % (12/24 0735) Weight:  [176 lb 5.9 oz (80 kg)] 176 lb 5.9 oz (80 kg) (12/24 0500)    Intake/Output from previous day: 12/23 0701 - 12/24 0700 In: 3843.7 [I.V.:2161; NG/GT:90; IV Piggyback:1050; TPN:542.7] Out: 2890 [Urine:2300; Emesis/NG output:150; Drains:440] Intake/Output this shift:    Intubated. Doesn't really follow commands Distended. Soft. Min BS. Drain - serous  Lab Results:   Recent Labs  09/30/14 0500 10/01/14 0430  WBC 11.2* 16.2*  HGB 12.3 11.2*  HCT 36.3 34.2*  PLT 134* 186   BMET  Recent Labs  09/30/14 1950 10/01/14 0430  NA 139 139  K 3.6 3.1*  CL 114* 113*  CO2 18* 20  GLUCOSE 99 109*  BUN 6 <5*  CREATININE 0.74 0.74  CALCIUM 7.0* 7.2*   PT/INR No results for input(s): LABPROT, INR in the last 72 hours. ABG  Recent Labs  09/29/14 0420  PHART 7.412  HCO3 18.5*    Studies/Results: Dg Chest Port 1 View  09/30/2014   CLINICAL DATA:  Respiratory failure.  EXAM: PORTABLE CHEST - 1 VIEW  COMPARISON:  02/27/2014.  FINDINGS: Endotracheal tube, right IJ line, NG tube in good anatomic position. Mediastinal structures normal. Heart size normal. Interim slight progression of bibasilar pulmonary infiltrates. Bibasilar subsegmental atelectasis. No pneumothorax. Small pleural effusions cannot be excluded.  IMPRESSION: 1. Lines and tubes in stable position. 2. Interim progression of bibasilar infiltrates. Subsegmental atelectasis both lung bases. Associated tiny bilateral pleural effusions cannot be excluded . 3. Heart size stable.  Pulmonary vascularity normal.   Electronically Signed   By: Marcello Moores   Register   On: 09/30/2014 07:57    Anti-infectives: Anti-infectives    Start     Dose/Rate Route Frequency Ordered Stop   09/30/14 1300  micafungin (MYCAMINE) 100 mg in sodium chloride 0.9 % 100 mL IVPB    Comments:  Pharmacy may adjust as needed   100 mg100 mL/hr over 1 Hours Intravenous Every 24 hours 09/30/14 1213     09/27/14 2100  ceFEPIme (MAXIPIME) 2 g in dextrose 5 % 50 mL IVPB  Status:  Discontinued     2 g100 mL/hr over 30 Minutes Intravenous Every 24 hours 09/27/14 0603 09/27/14 1103   09/27/14 2000  vancomycin (VANCOCIN) IVPB 1000 mg/200 mL premix  Status:  Discontinued     1,000 mg200 mL/hr over 60 Minutes Intravenous Every 24 hours 09/27/14 0603 09/27/14 1103   09/27/14 1400  vancomycin (VANCOCIN) IVPB 1000 mg/200 mL premix     1,000 mg200 mL/hr over 60 Minutes Intravenous Every 12 hours 09/27/14 1103     09/27/14 1200  ceFEPIme (MAXIPIME) 2 g in dextrose 5 % 50 mL IVPB     2 g100 mL/hr over 30 Minutes Intravenous Every 12 hours 09/27/14 1103     09/26/14 1930  vancomycin (VANCOCIN) IVPB 1000 mg/200 mL premix     1,000 mg200 mL/hr over 60 Minutes Intravenous  Once 09/26/14 1917 09/26/14 2210   09/26/14 1930  ceFEPIme (MAXIPIME) 2 g in dextrose 5 % 50 mL IVPB     2 g100 mL/hr over 30 Minutes Intravenous  Once 09/26/14 1917 09/26/14 2132      Assessment/Plan: s/p Procedure(s): Left Radial, Brachial, and Ulnar Thrombectomy; Left Brachial to Radial Bypass Graft using Greater Saphenous vein graft from Left Thigh; Left Saphenous Vein Harvest; Intraoperative Arteriogram; Intra-arterial administration of TPA (Left) S/p primary repair of perf gastric ulcer with graham patch  Path - no malignancy Cont NG tube Cont TPN Cont supportive care Hypokalemia - currently being replaced Hypomagnesia - being replaced Moderate PCMN - cont TPN  Leighton Ruff. Redmond Pulling, MD, FACS General, Bariatric, & Minimally Invasive Surgery [facility], [place]   LOS: 5 days    Gayland Curry 10/01/2014

## 2014-10-01 NOTE — Progress Notes (Signed)
[facility] Progress Note Patient Name: HAJA CREGO DOB: [birth year] MRN: [id number]   Date of Service  10/01/2014  HPI/Events of Note    eICU Interventions  Hypokalemia / hypomag -repleted      Intervention Category Intermediate Interventions: Electrolyte abnormality - evaluation and management  Rondall Radigan V. 10/01/2014, 6:22 AM

## 2014-10-01 NOTE — Progress Notes (Addendum)
   [facility]  Subjective  - intubated, sedated   Objective 159/85 106 98.9 F (37.2 C) (Oral) 15 98%  Intake/Output Summary (Last 24 hours) at 10/01/14 0736 Last data filed at 10/01/14 0700  Gross per 24 hour  Intake 3843.67 ml  Output   2890 ml  Net 953.67 ml    Palpable radial and DP pulses bilaterally Left hand cool to touch, finger tips showing decreased ischemic changes, right hand nail beds improving ischemic changes.  No open ulcers. AFO on right foot for foot drop    Assessment/Planning: S/P Left Radial, Brachial, and Ulnar Thrombectomy; Left Brachial to Radial Bypass Graft using Greater Saphenous vein graft from Left Thigh; Left Saphenous Vein Harvest; Intraoperative Arteriogram; Intra-arterial administration of TPA (Left) followed by redo. Heparin IV  Urine out put 50 cc /hr Cr 7.4 WBC 16.2 increased from 11.2 Lungs Vent intubated sedated    [name] [name] [name] 10/01/2014 7:36 AM --  Laboratory Lab Results:  Recent Labs  09/30/14 0500 10/01/14 0430  WBC 11.2* 16.2*  HGB 12.3 11.2*  HCT 36.3 34.2*  PLT 134* 186   BMET  Recent Labs  09/30/14 1950 10/01/14 0430  NA 139 139  K 3.6 3.1*  CL 114* 113*  CO2 18* 20  GLUCOSE 99 109*  BUN 6 <5*  CREATININE 0.74 0.74  CALCIUM 7.0* 7.2*    COAG No results found for: INR, PROTIME No results found for: PTT    Palpable left radial BPG.  Bilateral hands slightly better color, still with discoloration to some finger tips Fungemia yesterday, now treated Still not following commands Continue IV heparin given thrombotic events TEE pending  [name]

## 2014-10-01 NOTE — Progress Notes (Signed)
[facility] for Infectious Disease  Date of Admission:  09/26/2014  Antibiotics: micafungin day 2 Vancomycin/cefepime day 5  Subjective: Remains intubated  Objective: Temp:  [98.5 F (36.9 C)-100.5 F (38.1 C)] 100.5 F (38.1 C) (12/24 0900) Pulse Rate:  [81-120] 106 (12/24 0700) Resp:  [14-25] 15 (12/24 0700) BP: (108-169)/(50-112) 159/85 mmHg (12/24 0700) SpO2:  [90 %-100 %] 98 % (12/24 0700) FiO2 (%):  [40 %] 40 % (12/24 0735) Weight:  [176 lb 5.9 oz (80 kg)] 176 lb 5.9 oz (80 kg) (12/24 0500)  General: intubated, sedated Skin: no rashes, left leg wound without surrounding erythema Lungs: diffuse rhonchi Cor: RRR Abdomen: soft Ext: + mild edema  Lab Results Lab Results  Component Value Date   WBC 16.2* 10/01/2014   HGB 11.2* 10/01/2014   HCT 34.2* 10/01/2014   MCV 98.3 10/01/2014   PLT 186 10/01/2014    Lab Results  Component Value Date   CREATININE 0.74 10/01/2014   BUN <5* 10/01/2014   NA 139 10/01/2014   K 3.1* 10/01/2014   CL 113* 10/01/2014   CO2 20 10/01/2014    Lab Results  Component Value Date   ALT 30 10/01/2014   AST 49* 10/01/2014   ALKPHOS 73 10/01/2014   BILITOT 1.0 10/01/2014      Microbiology: Recent Results (from the past 240 hour(s))  Urine culture     Status: None   Collection Time: 09/26/14  7:22 PM  Result Value Ref Range Status   Specimen Description URINE, CATHETERIZED  Final   Special Requests NONE  Final   Culture  Setup Time   Final    09/28/2014 00:50 Performed at [place] Performed at [facility]   Final   Culture NO GROWTH Performed at [facility]   Final   Report Status 09/29/2014 FINAL  Final  Blood culture (routine x 2)     Status: None (Preliminary result)   Collection Time: 09/26/14  8:56 PM  Result Value Ref Range Status   Specimen Description BLOOD LEFT ANTECUBITAL DRAWN BY RN  Final   Special Requests BOTTLES DRAWN AEROBIC ONLY 4CC  Final    Culture  Setup Time   Final    09/30/2014 20:55 Performed at [facility]    Culture   Final    YEAST Note: Gram Stain Report Called to,Read Back By and Verified With: WILKINSON R([facility])@1140  ON 09/30/14 BY Elza Rafter. Performed at [facility] Performed at [facility]    Report Status PENDING  Incomplete  Blood culture (routine x 2)     Status: None (Preliminary result)   Collection Time: 09/26/14  9:00 PM  Result Value Ref Range Status   Specimen Description BLOOD LEFT ANTECUBITAL DRAWN BY RN  Final   Special Requests BOTTLES DRAWN AEROBIC ONLY 4CC  Final   Culture   Final    YEAST Gram Stain Report Called to,Read Back By and Verified With: DICKENS B AT  [facility] AT 2778 ON [id number] BY Mikel Cella K Performed at [facility]    Report Status PENDING  Incomplete  MRSA PCR Screening     Status: None   Collection Time: 09/26/14 11:30 PM  Result Value Ref Range Status   MRSA by PCR NEGATIVE NEGATIVE Final    Comment:        The GeneXpert MRSA Assay (FDA approved for NASAL specimens only), is one component of a comprehensive MRSA  colonization surveillance program. It is not intended to diagnose MRSA infection nor to guide or monitor treatment for MRSA infections.   Body fluid culture     Status: None (Preliminary result)   Collection Time: 09/27/14  1:59 AM  Result Value Ref Range Status   Specimen Description FLUID PERITONEAL  Final   Special Requests ON SWAB  Final   Gram Stain   Final    RARE WBC PRESENT, PREDOMINANTLY PMN NO ORGANISMS SEEN Performed at [facility]    Culture   Final    FEW YEAST Note: CRITICAL RESULT CALLED TO, READ BACK BY AND VERIFIED WITH: BEVON BY INGRAMA 12/23 12N Performed at [facility]    Report Status PENDING  Incomplete  Anaerobic culture     Status: None (Preliminary result)   Collection Time: 09/27/14  1:59 AM  Result Value Ref Range Status   Specimen Description FLUID PERITONEAL  Final    Special Requests NONE  Final   Gram Stain   Final    FEW WBC PRESENT, PREDOMINANTLY PMN NO SQUAMOUS EPITHELIAL CELLS SEEN NO ORGANISMS SEEN Performed at [facility]    Culture   Final    NO ANAEROBES ISOLATED; CULTURE IN PROGRESS FOR 5 DAYS Performed at [facility]    Report Status PENDING  Incomplete    Studies/Results: Dg Chest Port 1 View  09/30/2014   CLINICAL DATA:  Respiratory failure.  EXAM: PORTABLE CHEST - 1 VIEW  COMPARISON:  02/27/2014.  FINDINGS: Endotracheal tube, right IJ line, NG tube in good anatomic position. Mediastinal structures normal. Heart size normal. Interim slight progression of bibasilar pulmonary infiltrates. Bibasilar subsegmental atelectasis. No pneumothorax. Small pleural effusions cannot be excluded.  IMPRESSION: 1. Lines and tubes in stable position. 2. Interim progression of bibasilar infiltrates. Subsegmental atelectasis both lung bases. Associated tiny bilateral pleural effusions cannot be excluded . 3. Heart size stable.  Pulmonary vascularity normal.   Electronically Signed   By: Marcello Moores  Register   On: 09/30/2014 07:57    Assessment/Plan:  1) Yeast - now positive 2/2 blood cultures and in peritoneal fluid.  Micafungin started yesterday.  No pressors.    2) increased secretions - CXR ordered for today.  Has bilateral pumonary infiltrates.  On vancomycin and cefepime.    Dr. Tommy Medal to follow up tomorrow  Scharlene Gloss, [place] [facility] Infectious [facility] [place] [email or web address] [phone] pager   [phone] cell 10/01/2014, 11:31 AM

## 2014-10-01 NOTE — Progress Notes (Addendum)
PARENTERAL NUTRITION CONSULT NOTE - FOLLOW UP  Pharmacy Consult for TPN Indication: S/P exp lap for perforated gastric ulcer  Allergies  Allergen Reactions  . Ambien [Zolpidem Tartrate] Other (See Comments)    Crazy dreams  . Ceftin [Cefuroxime Axetil] Nausea And Vomiting  . Hctz [Hydrochlorothiazide] Other (See Comments)    Urinary retention  . Hydrocodone Nausea Only  . Lodine [Etodolac] Hives  . Promethazine Other (See Comments)    Hallucinations.   . Roxicodone [Oxycodone Hcl] Swelling  . Codeine Rash  . Latex Rash  . Penicillins Rash    Patient Measurements: Height: 4\' 9"  (144.8 cm) Weight: 176 lb 5.9 oz (80 kg) IBW/kg (Calculated) : 38.6 Adjusted Body Weight:  Usual Weight:   Vital Signs: Temp: 100.5 F (38.1 C) (12/24 0900) Temp Source: Axillary (12/24 0900) BP: 159/85 mmHg (12/24 0700) Pulse Rate: 106 (12/24 0700) Intake/Output from previous day: 12/23 0701 - 12/24 0700 In: 3843.7 [I.V.:2161; NG/GT:90; IV Piggyback:1050; TPN:542.7] Out: 2890 [Urine:2300; Emesis/NG output:150; Drains:440] Intake/Output from this shift: Total I/O In: 120 [I.V.:50; NG/GT:30; TPN:40] Out: 240 [Urine:240]  Labs:  Recent Labs  09/29/14 0500 09/30/14 0500 10/01/14 0430  WBC 11.7* 11.2* 16.2*  HGB 10.9* 12.3 11.2*  HCT 34.1* 36.3 34.2*  PLT 168 134* 186     Recent Labs  09/30/14 0500 09/30/14 1950 10/01/14 0430  NA 135 139 139  K 2.8* 3.6 3.1*  CL 109 114* 113*  CO2 18* 18* 20  GLUCOSE 100* 99 109*  BUN 7 6 <5*  CREATININE 0.77 0.74 0.74  CALCIUM 6.6* 7.0* 7.2*  MG  --   --  1.4*  PHOS  --   --  2.4  PROT  --   --  4.8*  ALBUMIN  --   --  1.6*  AST  --   --  49*  ALT  --   --  30  ALKPHOS  --   --  73  BILITOT  --   --  1.0   Estimated Creatinine Clearance: 74.1 mL/min (by C-G formula based on Cr of 0.74).    Recent Labs  09/30/14 1910 09/30/14 2327 10/01/14 0400  GLUCAP 112* 82 111*    Medications:  Scheduled:  . antiseptic oral rinse  7 mL  Mouth Rinse QID  . ceFEPime (MAXIPIME) IV  2 g Intravenous Q12H  . chlorhexidine  15 mL Mouth Rinse BID  . insulin aspart  0-9 Units Subcutaneous 6 times per day  . micafungin (MYCAMINE) IV  100 mg Intravenous Q24H  . pantoprazole (PROTONIX) IV  40 mg Intravenous Daily  . pneumococcal 23 valent vaccine  0.5 mL Intramuscular Tomorrow-1000  . potassium chloride  10 mEq Intravenous Q1 Hr x 4  . vancomycin  1,000 mg Intravenous Q12H    Admit:  Admitted 12/20 with abd pain with vomiting/fevers. (+)pneumoperitoneum 2nd SB rupture. Also, new cool L-hand & poss arterial clot  Insulin Requirements: No insulin in TPN and no SSI   Nutritional Requirements: [phone] Kcal with 60-65g protein per RD recommendations  Current Nutrition: Currently NPO with plan for UGI at least POD#5 (today is POD#4) prior to feeding.  Assessment:  GI: S/P exp lap/oversew of perforated pre-pyloric gastric ulcer; omental patch. (+)Hx GERD. NGT with 150mL out and JP drain with 483mL out yesterday. PPI-IV daily  Endo: Borderline DM. CBGs controlled  Lytes: K 3.1 (K runs x 4), Mg 1.4 (2g x 1) , Cr < 1  Renal: Cr 0.77, UOP 0.27ml/kg/hr yesterday.  Pulm:  Hx COPD, chronic bronchitis. Acute resp failure, intubated.  Thick secretions  Cards: S/P Ulnar artery embolectomy 12/20, on Heparin IV, no pressors.   Hepatobil: AST mildly elevated  Neuro: Hx MS, depression, periph neuropathy. Minimally arousable. Nicotine patch, Fent, Midazolam infusions  ID: Cefepime & Vancomycin (12/20 >> ) for sepsis.  Micafungin (12/23 >> ) for (+)yeast in blood and peritoneal fluid. Tc 100.5, WBC 16.2- increased.   Best Practices: mouthcare, SCDs TPN Access: R-IJ CVL TPN day#: 12/23 >>  Plan: Change Clinimix-E to 5/20 at 78ml/hr to provide 1056 kcal and 60g protein Plan to add Lipids if TPN > 7 days, TPN labs every Mon/Thurs BMet, Mg in AM Sensitive SSI q4  Gracy Bruins, PharmD [facility]

## 2014-10-01 NOTE — Progress Notes (Signed)
PULMONARY / CRITICAL CARE MEDICINE HISTORY AND PHYSICAL EXAMINATION   Name: [name] MRN: [id number] DOB: Aug 06, 1966    ADMISSION DATE:  09/26/2014  PRIMARY SERVICE: PCCM  CHIEF COMPLAINT:  Abd pain  BRIEF PATIENT DESCRIPTION: 22yof smoker with PMH of MS, depression presents with abd pain, found to have pneumoperitoneum 2/2 small bowel rupture, also with new cold L hand.  Going to OR imminently.    SIGNIFICANT EVENTS / STUDIES:  09/26/14: Admitted, CT Abd with pneumoperitoneum, cold L hand concerning for arterial clot as well. 12/20 > ex-lap [name]) with omental patch for perforated prepyloric gastric ulcer; L brachial and ulnar artery embolectomy [name]) 12/20 LE doppler >>neg 12/20 >>Left brachial to left radial artery bypass with left leg greater saphenous vein for Recurrent occlusion  12/20 Self extubated - had to be reintubated 12/21 echo >>nml LVEF 12/23 TEE ordered 12/24 TEE not yet completed  LINES / TUBES: R IJ CVL 12/20 >>  JP drain RUQ 12/20 >>  ETT 12/19 >>    SUBJECTIVE: Agitated on WUA. TEE not yet done. Biting on tube, not F/C   VITAL SIGNS: Temp:  [98.5 F (36.9 C)-100.5 F (38.1 C)] 100.5 F (38.1 C) (12/24 0900) Pulse Rate:  [81-120] 106 (12/24 0700) Resp:  [14-25] 15 (12/24 0700) BP: (108-169)/(50-112) 159/85 mmHg (12/24 0700) SpO2:  [90 %-100 %] 98 % (12/24 0700) FiO2 (%):  [40 %] 40 % (12/24 0735) Weight:  [80 kg (176 lb 5.9 oz)] 80 kg (176 lb 5.9 oz) (12/24 0500) HEMODYNAMICS:    Vent Mode:  [-] PRVC FiO2 (%):  [40 %] 40 % Set Rate:  [16 bmp-24 bmp] 16 bmp Vt Set:  [450 mL] 450 mL PEEP:  [5 cmH20] 5 cmH20 Plateau Pressure:  [14 cmH20-23 cmH20] 19 cmH20 INTAKE / OUTPUT: Intake/Output      12/23 0701 - 12/24 0700 12/24 0701 - 12/25 0700   I.V. (mL/kg) 2161 (27) 50 (0.6)   NG/GT 90 30   IV Piggyback 1050    TPN 542.7 40   Total Intake(mL/kg) 3843.7 (48) 120 (1.5)   Urine (mL/kg/hr) 2300 (1.2) 240 (0.7)   Emesis/NG output 150  (0.1)    Drains 440 (0.2)    Total Output 2890 240   Net +953.7 -120          PHYSICAL EXAMINATION:  Gen: RASS +1 to -2, not F/C HEENT: WNL PULM: Clear anteriorly CV: Reg, no  AB: absent BS, surgical dressing Ext: warm, no edema Neuro: no focal deficits  LABS: I have reviewed all of today's lab results. Relevant abnormalities are discussed in the A/P section  CXR: NNF  ASSESSMENT / PLAN:  Principal Problem:   Pneumoperitoneum Active Problems:   Sepsis   Altered mental state   Brachial artery occlusion   Acute respiratory failure with hypoxia   Severe sepsis   Perforated gastric ulcer   PULMONARY A: ETT 12/19 >>  Acute respiratory failure Pulm infiltrates P:   Cont full vent support - settings reviewed and/or adjusted Cont vent bundle Daily SBT if/when meets criteria  CARDIOVASCULAR A: Sinus tachycardia, reactive Hypertension, controlled Multiple thromboemboli P:   Cont labetalol prn Cont heparin gtt TEE ordered  RENAL A: AKI, resolved Hypokalemia P:   Monitor BMET intermittently Monitor I/Os Correct electrolytes as indicated  GASTROINTESTINAL A: Perforated gastric ulcer and pneumoperitoneum  Post laparotomy P:   Post op care per surgery Cont TPN Cont SUP  HEMATOLOGIC A: Very mild thrombocytopenia P:   DVT px: full  heparin Monitor CBC intermittently Transfuse per usual ICU guidelines  INFECTIOUS A: Peritonitis after bowel perforation Severe sepsis P:   Blood cx: 09/26/14-->  NGTD 12/20 peritoneal fluid >> few yeast  Vanc: 12/19 >>  Zosyn:  12/19 >>  Micafungin 12/23 >>   ID following  ENDOCRINE A:  Hypoglycemia, resolved Intermittent hyperglycemia P:   Cont SSI  NEUROLOGIC A: Post op pain ICU associated discomfort P:   RASS goal-1, -2 Cont PAD protocol Holding home SSRI and benzo   Social / Family:   TODAY'S SUMMARY:   CCM time 35 minutes.    Merton Border, MD ; [facility] [phone].   After 5:30 PM or weekends, call [phone]   10/01/2014, 11:23 AM

## 2014-10-01 NOTE — Progress Notes (Signed)
NUTRITION FOLLOW-UP  DOCUMENTATION CODES Per approved criteria  -Obesity Unspecified   INTERVENTION: TPN per Pharmacy, recommend goal protein of >/= 85 grams per day if possible.   NUTRITION DIAGNOSIS: Inadequate oral intake related to inability to eat as evidenced by NPO s/p bowel surgery, peritonitis; ongoing.   Goal:  Enteral nutrition to provide 60-70% of estimated calorie needs (22-25 kcals/kg ideal body weight) and 100% of estimated protein needs, based on ASPEN guidelines for hypocaloric, high protein feeding in critically ill obese individuals  Monitor:  Ability to transition to enteral nutrition, respiratory status, weight trends, labs Reason for Assessment: vent  48 y.o. female  Admitting Dx: Pneumoperitoneum  ASSESSMENT: Pt with h/o MS admitted with abdominal pain.  Patient found to have pneumoperitoneum 2/2 to small bowel rupture.  Also with brachial artery clot s/p surgery to repair both.   Pt remains on vent post-op for respiratory failure and peritonitis.  Patient is currently intubated on ventilator support MV: 11.2 L/min Temp (24hrs), Avg:99.2 F (37.3 C), Min:98.5 F (36.9 C), Max:100.5 F (38.1 C)  TPN: Clinimix E 5/20 @ 50 ml/hr providing 1056 kcal and 60 grams of protein per day. No lipids for now, pharmacy plans to add if on TPN > 7 days.  Potassium and Magnesium low.  NG tube to low intermittent suction.  Height: Ht Readings from Last 1 Encounters:  09/27/14 4\' 9"  (1.448 m)    Weight: Wt Readings from Last 1 Encounters:  10/01/14 176 lb 5.9 oz (80 kg)    BMI:  Body mass index is 38.16 kg/(m^2).  Estimated Nutritional Needs: Kcal: [phone] Protein: >/= 85 grams Fluid: 1.5 L/day  Skin: surgical incisions  Diet Order: Diet NPO time specified .TPN (CLINIMIX-E) Adult   Intake/Output Summary (Last 24 hours) at 10/01/14 0950 Last data filed at 10/01/14 0800  Gross per 24 hour  Intake 3580.67 ml  Output   2910 ml  Net 670.67 ml     Last BM: PTA  Labs:   Recent Labs Lab 09/30/14 0500 09/30/14 1950 10/01/14 0430  NA 135 139 139  K 2.8* 3.6 3.1*  CL 109 114* 113*  CO2 18* 18* 20  BUN 7 6 <5*  CREATININE 0.77 0.74 0.74  CALCIUM 6.6* 7.0* 7.2*  MG  --   --  1.4*  PHOS  --   --  2.4  GLUCOSE 100* 99 109*    CBG (last 3)   Recent Labs  09/30/14 1910 09/30/14 2327 10/01/14 0400  GLUCAP 112* 82 111*    Scheduled Meds: . antiseptic oral rinse  7 mL Mouth Rinse QID  . ceFEPime (MAXIPIME) IV  2 g Intravenous Q12H  . chlorhexidine  15 mL Mouth Rinse BID  . insulin aspart  0-9 Units Subcutaneous 6 times per day  . micafungin (MYCAMINE) IV  100 mg Intravenous Q24H  . pantoprazole (PROTONIX) IV  40 mg Intravenous Daily  . pneumococcal 23 valent vaccine  0.5 mL Intramuscular Tomorrow-1000  . potassium chloride  10 mEq Intravenous Q1 Hr x 4  . vancomycin  1,000 mg Intravenous Q12H    Continuous Infusions: . Marland KitchenTPN (CLINIMIX-E) Adult 40 mL/hr at 10/01/14 0700  . dextrose 5 % and 0.9% NaCl 35 mL/hr at 10/01/14 0700  . fentaNYL infusion INTRAVENOUS 300 mcg/hr (10/01/14 0705)  . heparin 1,600 Units/hr (10/01/14 0700)  . midazolam (VERSED) infusion 4 mg/hr (10/01/14 0700)   [place], [place], [place] Pager [phone] After Hours Pager

## 2014-10-01 NOTE — Progress Notes (Signed)
ANTICOAGULATION CONSULT NOTE - Follow-up  Pharmacy Consult for heparin Indication: ?arterial clot s/p thrombectomy  Allergies  Allergen Reactions  . Ambien [Zolpidem Tartrate] Other (See Comments)    Crazy dreams  . Ceftin [Cefuroxime Axetil] Nausea And Vomiting  . Hctz [Hydrochlorothiazide] Other (See Comments)    Urinary retention  . Hydrocodone Nausea Only  . Lodine [Etodolac] Hives  . Promethazine Other (See Comments)    Hallucinations.   . Roxicodone [Oxycodone Hcl] Swelling  . Codeine Rash  . Latex Rash  . Penicillins Rash    Patient Measurements: Height: 4\' 9"  (144.8 cm) Weight: 176 lb 5.9 oz (80 kg) IBW/kg (Calculated) : 38.6 Heparin Dosing Weight: 70 kg  Vital Signs: Temp: 98.9 F (37.2 C) (12/24 0400) Temp Source: Oral (12/24 0400) BP: 159/85 mmHg (12/24 0700) Pulse Rate: 106 (12/24 0700)  Labs:  Recent Labs  09/29/14 0500  09/30/14 0024 09/30/14 0500 09/30/14 0551 09/30/14 1950 10/01/14 0430  HGB 10.9*  --   --  12.3  --   --  11.2*  HCT 34.1*  --   --  36.3  --   --  34.2*  PLT 168  --   --  134*  --   --  186  HEPARINUNFRC 0.27*  < > 0.48  --  0.54  --  0.38  CREATININE 0.94  --   --  0.77  --  0.74 0.74  < > = values in this interval not displayed.  Estimated Creatinine Clearance: 74.1 mL/min (by C-G formula based on Cr of 0.74).  Assessment: 48yo female c/o abdominal pain w/ N/V, denies abnormal BMs, was sent emergently to OR for suspected perforated small bowel, intraoperatively found instead large perforated ulcer. Patient now with L hand cyanosis likely from radial arterial thrombus, low likleyhood of PE so she was started on heparin. TEE pending. Heparin level is therapeutic. No bleeding noted. H/H stable and platelets are WNL.   Goal of Therapy:  Heparin level 0.3-0.5 units/ml Monitor platelets by anticoagulation protocol: Yes   Plan:  1. Continue heparin gtt 1600 units/hr  2. Continue daily heparin level and CBC  Salome Arnt,  PharmD, BCPS Pager # [phone] 10/01/2014 8:01 AM

## 2014-10-02 ENCOUNTER — Inpatient Hospital Stay (HOSPITAL_COMMUNITY): Payer: BLUE CROSS/BLUE SHIELD

## 2014-10-02 DIAGNOSIS — I7090 Unspecified atherosclerosis: Secondary | ICD-10-CM | POA: Insufficient documentation

## 2014-10-02 DIAGNOSIS — B377 Candidal sepsis: Secondary | ICD-10-CM | POA: Insufficient documentation

## 2014-10-02 DIAGNOSIS — Z113 Encounter for screening for infections with a predominantly sexual mode of transmission: Secondary | ICD-10-CM | POA: Insufficient documentation

## 2014-10-02 DIAGNOSIS — G8929 Other chronic pain: Secondary | ICD-10-CM | POA: Insufficient documentation

## 2014-10-02 DIAGNOSIS — R6520 Severe sepsis without septic shock: Secondary | ICD-10-CM

## 2014-10-02 DIAGNOSIS — K255 Chronic or unspecified gastric ulcer with perforation: Secondary | ICD-10-CM

## 2014-10-02 DIAGNOSIS — J9600 Acute respiratory failure, unspecified whether with hypoxia or hypercapnia: Secondary | ICD-10-CM | POA: Insufficient documentation

## 2014-10-02 DIAGNOSIS — IMO0001 Reserved for inherently not codable concepts without codable children: Secondary | ICD-10-CM | POA: Insufficient documentation

## 2014-10-02 DIAGNOSIS — A419 Sepsis, unspecified organism: Secondary | ICD-10-CM

## 2014-10-02 LAB — POCT I-STAT 3, ART BLOOD GAS (G3+)
ACID-BASE DEFICIT: 5 mmol/L — AB (ref 0.0–2.0)
Bicarbonate: 21.9 mEq/L (ref 20.0–24.0)
O2 Saturation: 91 %
PCO2 ART: 50.1 mmHg — AB (ref 35.0–45.0)
PH ART: 7.249 — AB (ref 7.350–7.450)
PO2 ART: 71 mmHg — AB (ref 80.0–100.0)
TCO2: 23 mmol/L (ref 0–100)

## 2014-10-02 LAB — ANAEROBIC CULTURE

## 2014-10-02 LAB — CBC
HEMATOCRIT: 30.9 % — AB (ref 36.0–46.0)
HEMOGLOBIN: 10.1 g/dL — AB (ref 12.0–15.0)
MCH: 32 pg (ref 26.0–34.0)
MCHC: 32.7 g/dL (ref 30.0–36.0)
MCV: 97.8 fL (ref 78.0–100.0)
Platelets: 272 10*3/uL (ref 150–400)
RBC: 3.16 MIL/uL — ABNORMAL LOW (ref 3.87–5.11)
RDW: 13.7 % (ref 11.5–15.5)
WBC: 17.3 10*3/uL — AB (ref 4.0–10.5)

## 2014-10-02 LAB — BASIC METABOLIC PANEL
ANION GAP: 6 (ref 5–15)
BUN: <5 mg/dL — ABNORMAL LOW (ref 6–23)
CO2: 23 mmol/L (ref 19–32)
Calcium: 7.5 mg/dL — ABNORMAL LOW (ref 8.4–10.5)
Chloride: 111 mEq/L (ref 96–112)
Creatinine, Ser: 0.65 mg/dL (ref 0.50–1.10)
GFR calc Af Amer: >90 mL/min (ref >90)
GFR calc non Af Amer: >90 mL/min (ref >90)
GLUCOSE: 149 mg/dL — AB (ref 70–99)
Potassium: 2.9 mmol/L — ABNORMAL LOW (ref 3.5–5.1)
SODIUM: 140 mmol/L (ref 135–145)

## 2014-10-02 LAB — GLUCOSE, CAPILLARY
Glucose-Capillary: 109 mg/dL — ABNORMAL HIGH (ref 70–99)
Glucose-Capillary: 115 mg/dL — ABNORMAL HIGH (ref 70–99)
Glucose-Capillary: 132 mg/dL — ABNORMAL HIGH (ref 70–99)
Glucose-Capillary: 95 mg/dL (ref 70–99)

## 2014-10-02 LAB — HEPARIN LEVEL (UNFRACTIONATED)
HEPARIN UNFRACTIONATED: 0.27 [IU]/mL — AB (ref 0.30–0.70)
HEPARIN UNFRACTIONATED: 0.3 [IU]/mL (ref 0.30–0.70)
Heparin Unfractionated: 0.28 IU/mL — ABNORMAL LOW (ref 0.30–0.70)

## 2014-10-02 LAB — MAGNESIUM: MAGNESIUM: 1.8 mg/dL (ref 1.5–2.5)

## 2014-10-02 MED ORDER — POTASSIUM CHLORIDE 10 MEQ/50ML IV SOLN
10.0000 meq | INTRAVENOUS | Status: AC
  Administered 2014-10-02 (×6): 10 meq via INTRAVENOUS
  Filled 2014-10-02 (×5): qty 50

## 2014-10-02 MED ORDER — POTASSIUM CHLORIDE 10 MEQ/50ML IV SOLN: 10.0000 meq | INTRAVENOUS | Status: DC

## 2014-10-02 MED ORDER — TRACE MINERALS CR-CU-F-FE-I-MN-MO-SE-ZN IV SOLN
INTRAVENOUS | Status: AC
  Administered 2014-10-02: 17:00:00 via INTRAVENOUS
  Filled 2014-10-02: qty 2000

## 2014-10-02 MED ORDER — FAT EMULSION 20 % IV EMUL
240.0000 mL | INTRAVENOUS | Status: AC
  Administered 2014-10-02: 240 mL via INTRAVENOUS
  Filled 2014-10-02: qty 250

## 2014-10-02 NOTE — Progress Notes (Signed)
PARENTERAL NUTRITION CONSULT NOTE - FOLLOW UP  Pharmacy Consult for TPN Indication: S/P exp lap for perforated gastric ulcer  Allergies  Allergen Reactions  . Ambien [Zolpidem Tartrate] Other (See Comments)    Crazy dreams  . Ceftin [Cefuroxime Axetil] Nausea And Vomiting  . Hctz [Hydrochlorothiazide] Other (See Comments)    Urinary retention  . Hydrocodone Nausea Only  . Lodine [Etodolac] Hives  . Promethazine Other (See Comments)    Hallucinations.   . Roxicodone [Oxycodone Hcl] Swelling  . Codeine Rash  . Latex Rash  . Penicillins Rash    Patient Measurements: Height: 4\' 9"  (144.8 cm) Weight: 175 lb 11.3 oz (79.7 kg) IBW/kg (Calculated) : 38.6 Adjusted Body Weight: 52 kg  Vital Signs: Temp: 99.7 F (37.6 C) (12/25 0723) Temp Source: Axillary (12/25 0723) BP: 121/65 mmHg (12/25 0700) Pulse Rate: 88 (12/25 0800) Intake/Output from previous day: 12/24 0701 - 12/25 0700 In: 3520 [I.V.:1650; NG/GT:60; IV Piggyback:900; TPN:910] Out: 2875 [Urine:2665; Drains:210] Intake/Output from this shift: Total I/O In: 100 [IV Piggyback:50; TPN:50] Out: -   Labs:  Recent Labs  09/30/14 0500 10/01/14 0430 10/02/14 0340  WBC 11.2* 16.2* 17.3*  HGB 12.3 11.2* 10.1*  HCT 36.3 34.2* 30.9*  PLT 134* 186 272     Recent Labs  09/30/14 1950 10/01/14 0430 10/02/14 0340  NA 139 139 140  K 3.6 3.1* 2.9*  CL 114* 113* 111  CO2 18* 20 23  GLUCOSE 99 109* 149*  BUN 6 <5* <5*  CREATININE 0.74 0.74 0.65  CALCIUM 7.0* 7.2* 7.5*  MG  --  1.4* 1.8  PHOS  --  2.4  --   PROT  --  4.8*  --   ALBUMIN  --  1.6*  --   AST  --  49*  --   ALT  --  30  --   ALKPHOS  --  73  --   BILITOT  --  1.0  --   PREALBUMIN  --  3.6*  --    Estimated Creatinine Clearance: 73.9 mL/min (by C-G formula based on Cr of 0.65).    Recent Labs  10/01/14 1910 10/01/14 2108 10/01/14 2358  GLUCAP 117* 135* 115*   Insulin Requirements: 2 units Novolog SSI  Nutritional Requirements: [phone]  Kcal with 60-65g protein per RD recommendations (22-25 kcals/kg ideal body weight) based on ASPEN guidelines for hypocaloric, high protein feeding in critically ill obese individuals  Current Nutrition: Clinimix-E 5/20 at 16ml/hr provides 1056 kcal and 60g protein  Admit:  Admitted 12/20 with abd pain with vomiting/fevers. (+)pneumoperitoneum 2nd SB rupture.  GI: S/P exp lap/oversew of perforated pre-pyloric gastric ulcer; omental patch 12/20. (+)Hx GERD. NGT with 101mL out and JP drain with 262mL out yesterday.Plan for UGI on at least POD#5 (today is POD#5) prior to feeding - may be able to start tube feeds tomorrow per CCS note. PPI IV. Prealbumin 3.6 at baseline (low but noted recent surgery)  Endo: Borderline DM.CBGs well controlled - one episode of hypoglycemia 12/24 0900 (CBG 40)  Lytes: K 2.9 (K runs x 6 ordered per MD), Mg 1.8, Phos 2.4, Corr Ca 9.4  Renal: Cr stable, UOP 1.36ml/kg/hr. I/O +0.5 L yesterday. D5NS at 35 ml/hr.  Pulm: Hx COPD, chronic bronchitis. Acute resp failure, intubated - fio2 40%.   Cards:BP variable. HR 90s.  AC: S/P ulnar artery embolectomy 12/20, on Heparin IV.  Hepatobil: AST mildly elevated; other LFTs wnl. TG pending for the a.m.  Neuro: Hx MS, depression, periph  neuropathy.Nicotine patch, Fent and Versed infusions. RASS -2 (goal -2).  ID: Cefepime & Vancomycin (12/20 >> ) for sepsis.  Micafungin (12/23 >> ) for yeast in blood and peritoneal fluid. Tm 100.5, WBC 16.3- trending up.   Best Practices: MC, SCDs  TPN Access: R-IJ CVL  TPN day#: 12/23 >>  Plan: Change Clinimix to E 5/15 at 17ml/hr and 20% lipid emulsion on Mon, Wed, Fri to provide 1143 kcal and 66g protein. This will meet 100% of patients estimated needs. BMET, Mg, Phos, TG in a.m. Hopefully can begin tube feeds tomorrow and transition patient off TPN especially with fungemia.   Sherlon Handing, PharmD, BCPS Clinical pharmacist, pager [phone] 10/02/2014 8:36  AM

## 2014-10-02 NOTE — Progress Notes (Signed)
Attempted right arm PICC insertion without success.   Unable to thread guidewires via basilic or brachial veins.   Primary RN made aware.   RT DL CVC remains intact.

## 2014-10-02 NOTE — Progress Notes (Addendum)
   [facility]  Subjective  - Intubated and sedated   Objective 89/66 127 99.5 F (37.5 C) (Oral) 28 95%  Intake/Output Summary (Last 24 hours) at 10/02/14 2902 Last data filed at 10/02/14 0600  Gross per 24 hour  Intake   3470 ml  Output   2875 ml  Net    595 ml    Palpable radial and DP bilaterally Left handfinger tips showing decreased ischemic changes, right hand nail beds improving ischemic changes.  PRAFO on right LE  Assessment/Planning: S/P Left Radial, Brachial, and Ulnar Thrombectomy; Left Brachial to Radial Bypass Graft using Greater Saphenous vein graft from Left Thigh; Left Saphenous Vein Harvest; Intraoperative Arteriogram; Intra-arterial administration of TPA (Left) followed by redo. Heparin IV  Lungs intubated WBC increased 17.3 from 16.2 Hypokalemia 2.9 repleted per CCM this am order   Laurence Slate [facility] 10/02/2014 7:12 AM --  Laboratory Lab Results:  Recent Labs  10/01/14 0430 10/02/14 0340  WBC 16.2* 17.3*  HGB 11.2* 10.1*  HCT 34.2* 30.9*  PLT 186 272   BMET  Recent Labs  10/01/14 0430 10/02/14 0340  NA 139 140  K 3.1* 2.9*  CL 113* 111  CO2 20 23  GLUCOSE 109* 149*  BUN <5* <5*  CREATININE 0.74 0.65  CALCIUM 7.2* 7.5*    COAG No results found for: INR, PROTIME No results found for: PTT  3+ radial pulse palpable bilaterally with ischemic changes improving Remains on IV heparin-sedated on ventilator  Stable from vascular standpoint

## 2014-10-02 NOTE — Progress Notes (Signed)
ANTICOAGULATION CONSULT NOTE  Pharmacy Consult for heparin Indication: arterial clot s/p thrombectomy  Allergies  Allergen Reactions  . Ambien [Zolpidem Tartrate] Other (See Comments)    Crazy dreams  . Ceftin [Cefuroxime Axetil] Nausea And Vomiting  . Hctz [Hydrochlorothiazide] Other (See Comments)    Urinary retention  . Hydrocodone Nausea Only  . Lodine [Etodolac] Hives  . Promethazine Other (See Comments)    Hallucinations.   . Roxicodone [Oxycodone Hcl] Swelling  . Codeine Rash  . Latex Rash  . Penicillins Rash    Patient Measurements: Height: 4\' 9"  (144.8 cm) Weight: 175 lb 11.3 oz (79.7 kg) IBW/kg (Calculated) : 38.6 Heparin Dosing Weight: 70 kg  Vital Signs: Temp: 100.8 F (38.2 C) (12/25 2000) Temp Source: Oral (12/25 2000) BP: 143/62 mmHg (12/25 2300) Pulse Rate: 101 (12/25 2300)  Labs:  Recent Labs  09/30/14 0500  09/30/14 1950 10/01/14 0430 10/02/14 0339 10/02/14 0340 10/02/14 1500 10/02/14 2300  HGB 12.3  --   --  11.2*  --  10.1*  --   --   HCT 36.3  --   --  34.2*  --  30.9*  --   --   PLT 134*  --   --  186  --  272  --   --   HEPARINUNFRC  --   < >  --  0.38 0.27*  --  0.28* 0.30  CREATININE 0.77  --  0.74 0.74  --  0.65  --   --   < > = values in this interval not displayed.  Estimated Creatinine Clearance: 73.9 mL/min (by C-G formula based on Cr of 0.65).  Assessment: 48 yo s/p LUE thrombectomy for heparin   Goal of Therapy:  Heparin level 0.3-0.5 units/ml Monitor platelets by anticoagulation protocol: Yes   Plan:  Continue Heparin at current rate  Follow-up am labs.  Phillis Knack, PharmD, BCPS  10/02/2014 11:40 PM

## 2014-10-02 NOTE — Progress Notes (Signed)
[place] Progress Note Patient Name: [name] DOB: 09-Apr-1966 MRN: [id number]   Date of Service  10/02/2014  HPI/Events of Note    eICU Interventions  K+ repleted     Intervention Category Intermediate Interventions: Electrolyte abnormality - evaluation and management  Merton Border 10/02/2014, 5:13 AM

## 2014-10-02 NOTE — Progress Notes (Signed)
ANTICOAGULATION and ANTIBIOTIC CONSULT NOTE - Follow Up Consult  Pharmacy Consult for Heparin, Vancomycin, and Cefepime Indication: ?Arterial clot S/P thrombectomy; rule-out sepsis  Allergies  Allergen Reactions  . Ambien [Zolpidem Tartrate] Other (See Comments)    Crazy dreams  . Ceftin [Cefuroxime Axetil] Nausea And Vomiting  . Hctz [Hydrochlorothiazide] Other (See Comments)    Urinary retention  . Hydrocodone Nausea Only  . Lodine [Etodolac] Hives  . Promethazine Other (See Comments)    Hallucinations.   . Roxicodone [Oxycodone Hcl] Swelling  . Codeine Rash  . Latex Rash  . Penicillins Rash    Patient Measurements: Height: 4\' 9"  (144.8 cm) Weight: 175 lb 11.3 oz (79.7 kg) IBW/kg (Calculated) : 38.6 Heparin Dosing Weight: 70 kg  Vital Signs: Temp: 99.7 F (37.6 C) (12/25 0723) Temp Source: Axillary (12/25 0723) BP: 121/65 mmHg (12/25 0700) Pulse Rate: 88 (12/25 0800)  Labs:  Recent Labs  09/30/14 0500 09/30/14 0551 09/30/14 1950 10/01/14 0430 10/02/14 0339 10/02/14 0340  HGB 12.3  --   --  11.2*  --  10.1*  HCT 36.3  --   --  34.2*  --  30.9*  PLT 134*  --   --  186  --  272  HEPARINUNFRC  --  0.54  --  0.38 0.27*  --   CREATININE 0.77  --  0.74 0.74  --  0.65    Estimated Creatinine Clearance: 73.9 mL/min (by C-G formula based on Cr of 0.65).   Medications:  Scheduled:  . antiseptic oral rinse  7 mL Mouth Rinse QID  . ceFEPime (MAXIPIME) IV  2 g Intravenous Q12H  . chlorhexidine  15 mL Mouth Rinse BID  . insulin aspart  0-9 Units Subcutaneous 6 times per day  . micafungin (MYCAMINE) IV  100 mg Intravenous Q24H  . pantoprazole (PROTONIX) IV  40 mg Intravenous Daily  . potassium chloride  10 mEq Intravenous Q1 Hr x 6  . vancomycin  1,000 mg Intravenous Q12H   Infusions:  . Marland KitchenTPN (CLINIMIX-E) Adult 50 mL/hr at 10/01/14 1717  . dextrose 5 % and 0.9% NaCl    . fentaNYL infusion INTRAVENOUS 150 mcg/hr (10/02/14 0800)  . heparin 1,600 Units/hr  (10/02/14 0800)  . midazolam (VERSED) infusion 4 mg/hr (10/02/14 0800)    Assessment: 31 yoF with abdominal pain and N/V found to have a large perforated ulcer.  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 Of note, she underwent a thrombectomy on 12/20 and received intra-arterial TPA.  Today her HL is slightly subtherapeutic at 0.27 on Heparin 1600 units/hr. Hgb trended down slightly but is stable and platelets are imprved at 272. Not signs of bleeding have been noted.  As she has a low heparin goal and remains at risk for bleeding, will not bolus.  She continues on day #7 of vancomycin and cefepime for sepsis and day #2 of micafungin for fungemia.  She is afebrile.  WBC count remains elevated at 17.3.  CrCl remains stable around 74 mL/min. Urine output is good.    Goal of Therapy:  Heparin level 0.3-0.5 units/ml Monitor platelets by anticoagulation protocol: Yes  VT 15-20 Resolution of infection and clinical improvement   Plan:  1. Increase heparin gtt to 1740 units/hr  2. Check 6-hr HL @ 1500, daily HL and CBC 2. Continue vanc 1gm IV Q12H  3. Continue cefepime 2gm IV Q12H 4. Continue Micafungin 100 mg q24h 5. F/u renal fxn, C&S, clinical status and  weekly trough  Theron Arista, PharmD Clinical Pharmacist - Resident Pager: [phone] 12/25/20158:44 AM

## 2014-10-02 NOTE — Progress Notes (Signed)
5 Days Post-Op  Subjective: Remains of the vent  Objective: Vital signs in last 24 hours: Temp:  [99.5 F (37.5 C)-100.5 F (38.1 C)] 99.7 F (37.6 C) (12/25 0723) Pulse Rate:  [79-127] 88 (12/25 0800) Resp:  [14-28] 16 (12/25 0800) BP: (86-145)/(37-98) 121/65 mmHg (12/25 0700) SpO2:  [95 %-100 %] 99 % (12/25 0800) FiO2 (%):  [40 %] 40 % (12/25 0800) Weight:  [175 lb 11.3 oz (79.7 kg)] 175 lb 11.3 oz (79.7 kg) (12/25 0500)    Intake/Output from previous day: 12/24 0701 - 12/25 0700 In: 3520 [I.V.:1650; NG/GT:60; IV Piggyback:900; TPN:910] Out: 2875 [Urine:2665; Drains:210] Intake/Output this shift: Total I/O In: 130 [NG/GT:30; IV Piggyback:50; TPN:50] Out: 200 [Urine:150; Emesis/NG output:50]  Abdomen soft, wound clean Drain without bile  Lab Results:   Recent Labs  10/01/14 0430 10/02/14 0340  WBC 16.2* 17.3*  HGB 11.2* 10.1*  HCT 34.2* 30.9*  PLT 186 272   BMET  Recent Labs  10/01/14 0430 10/02/14 0340  NA 139 140  K 3.1* 2.9*  CL 113* 111  CO2 20 23  GLUCOSE 109* 149*  BUN <5* <5*  CREATININE 0.74 0.65  CALCIUM 7.2* 7.5*   PT/INR No results for input(s): LABPROT, INR in the last 72 hours. ABG No results for input(s): PHART, HCO3 in the last 72 hours.  Invalid input(s): PCO2, PO2  Studies/Results: Dg Chest Port 1 View  10/02/2014   CLINICAL DATA:  Respiratory failure.  EXAM: PORTABLE CHEST - 1 VIEW  COMPARISON:  09/30/2014  FINDINGS: Endotracheal tube is 3.9 cm above the carina. Nasogastric tube extends into the abdomen but the tip is beyond the image. Right central line tip in the SVC region. There are increased airspace densities, particularly in the upper lungs. However, there is improved aeration at the lung bases, particularly on the right side. Heart size is normal and stable. Surgical plate in the lower cervical spine. Negative for a pneumothorax.  IMPRESSION: Increased airspace disease in the upper lungs with improved aeration at the lung  bases. Findings may be related to edema and/or infection.  Support apparatuses as described.   Electronically Signed   By: Markus Daft M.D.   On: 10/02/2014 08:06    Anti-infectives: Anti-infectives    Start     Dose/Rate Route Frequency Ordered Stop   09/30/14 1300  micafungin (MYCAMINE) 100 mg in sodium chloride 0.9 % 100 mL IVPB    Comments:  Pharmacy may adjust as needed   100 mg100 mL/hr over 1 Hours Intravenous Every 24 hours 09/30/14 1213     09/27/14 2100  ceFEPIme (MAXIPIME) 2 g in dextrose 5 % 50 mL IVPB  Status:  Discontinued     2 g100 mL/hr over 30 Minutes Intravenous Every 24 hours 09/27/14 0603 09/27/14 1103   09/27/14 2000  vancomycin (VANCOCIN) IVPB 1000 mg/200 mL premix  Status:  Discontinued     1,000 mg200 mL/hr over 60 Minutes Intravenous Every 24 hours 09/27/14 0603 09/27/14 1103   09/27/14 1400  vancomycin (VANCOCIN) IVPB 1000 mg/200 mL premix     1,000 mg200 mL/hr over 60 Minutes Intravenous Every 12 hours 09/27/14 1103     09/27/14 1200  ceFEPIme (MAXIPIME) 2 g in dextrose 5 % 50 mL IVPB     2 g100 mL/hr over 30 Minutes Intravenous Every 12 hours 09/27/14 1103     09/26/14 1930  vancomycin (VANCOCIN) IVPB 1000 mg/200 mL premix     1,000 mg200 mL/hr over 60 Minutes Intravenous  Once 09/26/14 1917 09/26/14 2210   09/26/14 1930  ceFEPIme (MAXIPIME) 2 g in dextrose 5 % 50 mL IVPB     2 g100 mL/hr over 30 Minutes Intravenous  Once 09/26/14 1917 09/26/14 2132      Assessment/Plan: s/p Procedure(s): Left Radial, Brachial, and Ulnar Thrombectomy; Left Brachial to Radial Bypass Graft using Greater Saphenous vein graft from Left Thigh; Left Saphenous Vein Harvest; Intraoperative Arteriogram; Intra-arterial administration of TPA (Left)  S/p repair of perforated ulcer  Continue NPO and NG May be able to start tube feeds tomorrow Continue wound care  LOS: 6 days    Tayten Bergdoll A 10/02/2014

## 2014-10-02 NOTE — Progress Notes (Signed)
PULMONARY / CRITICAL CARE MEDICINE HISTORY AND PHYSICAL EXAMINATION   Name: [name] MRN: [id number] DOB: 1966/03/02    ADMISSION DATE:  09/26/2014  PRIMARY SERVICE: PCCM  CHIEF COMPLAINT:  Abd pain  BRIEF PATIENT DESCRIPTION: 86yof smoker with PMH of MS, depression presents with abd pain, found to have pneumoperitoneum 2/2 small bowel rupture, also with new cold L hand.  Going to OR imminently.    SIGNIFICANT EVENTS / STUDIES:  09/26/14: Admitted, CT Abd with pneumoperitoneum, cold L hand concerning for arterial clot as well. 12/20 > ex-lap [name]) with omental patch for perforated prepyloric gastric ulcer; L brachial and ulnar artery embolectomy [name]) 12/20 LE doppler >>neg 12/20 >>Left brachial to left radial artery bypass with left leg greater saphenous vein for Recurrent occlusion  12/20 Self extubated - had to be reintubated 12/21 echo >>nml LVEF 12/23 TEE ordered 12/24 TEE not yet completed  LINES / TUBES: R IJ CVL 12/20 >>  JP drain RUQ 12/20 >>  ETT 12/19 >>    SUBJECTIVE: Afebrile Agitated on WUA. TEE not yet done. Good Uo  VITAL SIGNS: Temp:  [99.5 F (37.5 C)-99.7 F (37.6 C)] 99.7 F (37.6 C) (12/25 0723) Pulse Rate:  [79-127] 88 (12/25 0800) Resp:  [14-28] 16 (12/25 0800) BP: (86-145)/(37-98) 121/65 mmHg (12/25 0700) SpO2:  [95 %-100 %] 99 % (12/25 0800) FiO2 (%):  [40 %] 40 % (12/25 0851) Weight:  [79.7 kg (175 lb 11.3 oz)] 79.7 kg (175 lb 11.3 oz) (12/25 0500) HEMODYNAMICS:    Vent Mode:  [-] PSV;CPAP FiO2 (%):  [40 %] 40 % Set Rate:  [16 bmp] 16 bmp Vt Set:  [450 mL] 450 mL PEEP:  [5 cmH20] 5 cmH20 Pressure Support:  [5 cmH20] 5 cmH20 Plateau Pressure:  [16 cmH20-22 cmH20] 16 cmH20 INTAKE / OUTPUT: Intake/Output      12/24 0701 - 12/25 0700 12/25 0701 - 12/26 0700   I.V. (mL/kg) 1650 (20.7)    NG/GT 60 30   IV Piggyback 900 50   TPN 910 50   Total Intake(mL/kg) 3520 (44.2) 130 (1.6)   Urine (mL/kg/hr) 2665 (1.4) 150 (0.9)    Emesis/NG output  50 (0.3)   Drains 210 (0.1)    Total Output 2875 200   Net +645 -70          PHYSICAL EXAMINATION:  Gen: RASS +1 to -2, not F/C HEENT: WNL PULM: Clear anteriorly CV: Reg, no  AB: absent BS, surgical dressing Ext: warm, no edema Neuro: no focal deficits  LABS: I have reviewed all of today's lab results. Relevant abnormalities are discussed in the A/P section  CXR: NNF  ASSESSMENT / PLAN:  Principal Problem:   Pneumoperitoneum Active Problems:   Sepsis   Altered mental state   Brachial artery occlusion   Acute respiratory failure with hypoxia   Severe sepsis   Perforated gastric ulcer   PULMONARY A: ETT 12/19 >>  Acute respiratory failure Pulm infiltrates P:   Cont full vent support - settings reviewed and/or adjusted Cont vent bundle Daily SBT if/when meets criteria -OK to wean on low dose sedation  CARDIOVASCULAR A: Sinus tachycardia, reactive Hypertension, controlled Multiple thromboemboli P:   Cont labetalol prn Cont heparin gtt TEE requested  RENAL A: AKI, resolved Hypokalemia P:   Monitor BMET intermittently Monitor I/Os Correct electrolytes as indicated  GASTROINTESTINAL A: Perforated gastric ulcer and pneumoperitoneum  Post laparotomy P:   Post op care per surgery Cont TPN Cont SUP  HEMATOLOGIC A:  Very mild thrombocytopenia P:   DVT px: full heparin Monitor CBC intermittently Transfuse per usual ICU guidelines  INFECTIOUS A: Peritonitis after bowel perforation Severe sepsis Fungemia P:   Blood cx: 09/26/14-->  NGTD 12/20 peritoneal fluid >> few yeast  Vanc: 12/19 >>  Zosyn:  12/19 >>  Micafungin 12/23 >>   ID following ,consider stopping vanc  ENDOCRINE A:  Hypoglycemia, resolved Intermittent hyperglycemia P:   Cont SSI  NEUROLOGIC A: Post op pain Acute encephalopathy/ delerium P:   RASS goal-1, -2 Cont PAD protocol , versed gtt Holding home SSRI and benzo   Social / Family:    TODAY'S SUMMARY: Need better control of delerium for extubation, TEE pending  CCM time 35 minutes.    The patient is critically ill with multiple organ systems failure and requires high complexity decision making for assessment and support, frequent evaluation and titration of therapies, application of advanced monitoring technologies and extensive interpretation of multiple databases. Critical Care Time devoted to patient care services described in this note independent of APP time is 35 minutes.    [name] MD  10/02/2014, 9:10 AM

## 2014-10-02 NOTE — Progress Notes (Signed)
ANTICOAGULATION CONSULT NOTE - Follow Up Consult  Pharmacy Consult for heparin Indication: arterial clot s/p thrombectomy  Allergies  Allergen Reactions  . Ambien [Zolpidem Tartrate] Other (See Comments)    Crazy dreams  . Ceftin [Cefuroxime Axetil] Nausea And Vomiting  . Hctz [Hydrochlorothiazide] Other (See Comments)    Urinary retention  . Hydrocodone Nausea Only  . Lodine [Etodolac] Hives  . Promethazine Other (See Comments)    Hallucinations.   . Roxicodone [Oxycodone Hcl] Swelling  . Codeine Rash  . Latex Rash  . Penicillins Rash    Patient Measurements: Height: 4\' 9"  (144.8 cm) Weight: 175 lb 11.3 oz (79.7 kg) IBW/kg (Calculated) : 38.6 Heparin Dosing Weight: 70 kg  Vital Signs: Temp: 99.5 F (37.5 C) (12/25 1530) Temp Source: Axillary (12/25 1530) BP: 123/63 mmHg (12/25 1400) Pulse Rate: 94 (12/25 1400)  Labs:  Recent Labs  09/30/14 0500  09/30/14 1950 10/01/14 0430 10/02/14 0339 10/02/14 0340 10/02/14 1500  HGB 12.3  --   --  11.2*  --  10.1*  --   HCT 36.3  --   --  34.2*  --  30.9*  --   PLT 134*  --   --  186  --  272  --   HEPARINUNFRC  --   < >  --  0.38 0.27*  --  0.28*  CREATININE 0.77  --  0.74 0.74  --  0.65  --   < > = values in this interval not displayed.  Estimated Creatinine Clearance: 73.9 mL/min (by C-G formula based on Cr of 0.65).   Medications:  Infusions:  . Marland KitchenTPN (CLINIMIX-E) Adult 50 mL/hr at 10/01/14 1717  . dextrose 5 % and 0.9% NaCl    . Marland KitchenTPN (CLINIMIX-E) Adult     And  . fat emulsion    . fentaNYL infusion INTRAVENOUS 100 mcg/hr (10/02/14 1400)  . heparin 1,750 Units/hr (10/02/14 1400)  . midazolam (VERSED) infusion Stopped (10/02/14 1100)    Assessment: 50 yoF with abdominal pain and N/V found to have a large perforated ulcer. 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 Of note, she underwent a thrombectomy on 12/20 and received intra-arterial TPA.  Heparin level is SUBtherapeutic  at 0.28 on 1750 units/hr. No bleeding noted.  Goal of Therapy:  Heparin level 0.3-0.5 units/ml Monitor platelets by anticoagulation protocol: Yes   Plan:  - Increase heparin drip to 1900 units/hr - 6 hr heparin level - Monitor for s/sx of bleeding  [facility] [place], [place].D., BCPS Clinical Pharmacist Pager: [phone] 10/02/2014 4:16 PM

## 2014-10-02 NOTE — Progress Notes (Signed)
Peripherally Inserted Central Catheter/Midline Placement  The IV Nurse has discussed with the patient and/or persons authorized to consent for the patient, the purpose of this procedure and the potential benefits and risks involved with this procedure.  The benefits include less needle sticks, lab draws from the catheter and patient may be discharged home with the catheter.  Risks include, but not limited to, infection, bleeding, blood clot (thrombus formation), and puncture of an artery; nerve damage and irregular heat beat.  Alternatives to this procedure were also discussed.  PICC/Midline Placement Documentation    Staff RN obtained consent    [name] 10/02/2014, 12:29 PM

## 2014-10-02 NOTE — Progress Notes (Addendum)
[name] for Infectious Disease    Day 7 vancomycin cefepime  Day #3 micafungin  Subjective: Intubated sedated   Antibiotics:  Anti-infectives    Start     Dose/Rate Route Frequency Ordered Stop   09/30/14 1300  micafungin (MYCAMINE) 100 mg in sodium chloride 0.9 % 100 mL IVPB    Comments:  Pharmacy may adjust as needed   100 mg100 mL/hr over 1 Hours Intravenous Every 24 hours 09/30/14 1213     09/27/14 2100  ceFEPIme (MAXIPIME) 2 g in dextrose 5 % 50 mL IVPB  Status:  Discontinued     2 g100 mL/hr over 30 Minutes Intravenous Every 24 hours 09/27/14 0603 09/27/14 1103   09/27/14 2000  vancomycin (VANCOCIN) IVPB 1000 mg/200 mL premix  Status:  Discontinued     1,000 mg200 mL/hr over 60 Minutes Intravenous Every 24 hours 09/27/14 0603 09/27/14 1103   09/27/14 1400  vancomycin (VANCOCIN) IVPB 1000 mg/200 mL premix  Status:  Discontinued     1,000 mg200 mL/hr over 60 Minutes Intravenous Every 12 hours 09/27/14 1103 10/02/14 1348   09/27/14 1200  ceFEPIme (MAXIPIME) 2 g in dextrose 5 % 50 mL IVPB  Status:  Discontinued     2 g100 mL/hr over 30 Minutes Intravenous Every 12 hours 09/27/14 1103 10/02/14 1348   09/26/14 1930  vancomycin (VANCOCIN) IVPB 1000 mg/200 mL premix     1,000 mg200 mL/hr over 60 Minutes Intravenous  Once 09/26/14 1917 09/26/14 2210   09/26/14 1930  ceFEPIme (MAXIPIME) 2 g in dextrose 5 % 50 mL IVPB     2 g100 mL/hr over 30 Minutes Intravenous  Once 09/26/14 1917 09/26/14 2132      Medications: Scheduled Meds: . antiseptic oral rinse  7 mL Mouth Rinse QID  . chlorhexidine  15 mL Mouth Rinse BID  . insulin aspart  0-9 Units Subcutaneous 6 times per day  . micafungin (MYCAMINE) IV  100 mg Intravenous Q24H  . pantoprazole (PROTONIX) IV  40 mg Intravenous Daily   Continuous Infusions: . Marland KitchenTPN (CLINIMIX-E) Adult 50 mL/hr at 10/01/14 1717  . dextrose 5 % and 0.9% NaCl    . Marland KitchenTPN (CLINIMIX-E) Adult     And  . fat emulsion    . fentaNYL infusion  INTRAVENOUS 150 mcg/hr (10/02/14 1300)  . heparin 1,750 Units/hr (10/02/14 1300)  . midazolam (VERSED) infusion Stopped (10/02/14 1100)   PRN Meds:.sodium chloride, acetaminophen (TYLENOL) oral liquid 160 mg/5 mL, acetaminophen, fentaNYL, fentaNYL, ipratropium-albuterol, labetalol, ondansetron (ZOFRAN) IV    Objective: Weight change: -10.6 oz (-0.3 kg)  Intake/Output Summary (Last 24 hours) at 10/02/14 1428 Last data filed at 10/02/14 1322  Gross per 24 hour  Intake   3005 ml  Output   1525 ml  Net   1480 ml   Blood pressure 123/63, pulse 94, temperature 99.9 F (37.7 C), temperature source Axillary, resp. rate 19, height 4\' 9"  (1.448 m), weight 175 lb 11.3 oz (79.7 kg), SpO2 89 %. Temp:  [99.5 F (37.5 C)-99.9 F (37.7 C)] 99.9 F (37.7 C) (12/25 1126) Pulse Rate:  [79-127] 94 (12/25 1400) Resp:  [14-37] 19 (12/25 1400) BP: (89-157)/(37-98) 123/63 mmHg (12/25 1400) SpO2:  [89 %-100 %] 89 % (12/25 1400) FiO2 (%):  [40 %] 40 % (12/25 1200) Weight:  [175 lb 11.3 oz (79.7 kg)] 175 lb 11.3 oz (79.7 kg) (12/25 0500)  Physical Exam: General: intubated sedated. HEENT:  EOMI CVS tachycardic normal r,  no murmur rubs or gallops  Chest: +rhonchi Abdomen: soft distended, positive bowel sounds dressing in place  Neuro: nonfocal  CBC: CBC Latest Ref Rng 10/02/2014 10/01/2014 09/30/2014  WBC 4.0 - 10.5 K/uL 17.3(H) 16.2(H) 11.2(H)  Hemoglobin 12.0 - 15.0 g/dL 10.1(L) 11.2(L) 12.3  Hematocrit 36.0 - 46.0 % 30.9(L) 34.2(L) 36.3  Platelets 150 - 400 K/uL 272 186 134(L)      BMET  Recent Labs  10/01/14 0430 10/02/14 0340  NA 139 140  K 3.1* 2.9*  CL 113* 111  CO2 20 23  GLUCOSE 109* 149*  BUN <5* <5*  CREATININE 0.74 0.65  CALCIUM 7.2* 7.5*     Liver Panel   Recent Labs  10/01/14 0430  PROT 4.8*  ALBUMIN 1.6*  AST 49*  ALT 30  ALKPHOS 73  BILITOT 1.0       Sedimentation Rate No results for input(s): ESRSEDRATE in the last 72 hours. C-Reactive  Protein No results for input(s): CRP in the last 72 hours.  Micro Results: Recent Results (from the past 720 hour(s))  Urine culture     Status: None   Collection Time: 09/26/14  7:22 PM  Result Value Ref Range Status   Specimen Description URINE, CATHETERIZED  Final   Special Requests NONE  Final   Culture  Setup Time   Final    09/28/2014 00:50 Performed at [place] Performed at [facility]   Final   Culture NO GROWTH Performed at [facility]   Final   Report Status 09/29/2014 FINAL  Final  Blood culture (routine x 2)     Status: None (Preliminary result)   Collection Time: 09/26/14  8:56 PM  Result Value Ref Range Status   Specimen Description BLOOD LEFT ANTECUBITAL DRAWN BY RN  Final   Special Requests BOTTLES DRAWN AEROBIC ONLY 4CC  Final   Culture  Setup Time   Final    09/30/2014 20:55 Performed at [facility]    Culture   Final    YEAST Note: Gram Stain Report Called to,Read Back By and Verified With: WILKINSON R([facility])@1140  ON 09/30/14 BY Elza Rafter. Performed at [facility] Performed at [facility]    Report Status PENDING  Incomplete  Blood culture (routine x 2)     Status: None (Preliminary result)   Collection Time: 09/26/14  9:00 PM  Result Value Ref Range Status   Specimen Description BLOOD LEFT ANTECUBITAL DRAWN BY RN  Final   Special Requests BOTTLES DRAWN AEROBIC ONLY 4CC  Final   Culture   Final    YEAST Note: Gram Stain Report Called to,Read Back By and Verified With: DICKENS B AT [facility] AT 7829 ON [id number] BY FORSYTH K Performed at [facility]    Report Status PENDING  Incomplete  MRSA PCR Screening     Status: None   Collection Time: 09/26/14 11:30 PM  Result Value Ref Range Status   MRSA by PCR NEGATIVE NEGATIVE Final    Comment:        The GeneXpert MRSA Assay (FDA approved for NASAL specimens only), is one component of a comprehensive MRSA  colonization surveillance program. It is not intended to diagnose MRSA infection nor to guide or monitor treatment for MRSA infections.   Body fluid culture     Status: None (Preliminary result)   Collection Time: 09/27/14  1:59 AM  Result Value Ref Range Status   Specimen Description FLUID PERITONEAL  Final  Special Requests ON SWAB  Final   Gram Stain   Final    RARE WBC PRESENT, PREDOMINANTLY PMN NO ORGANISMS SEEN Performed at [facility]    Culture   Final    FEW YEAST Note: CRITICAL RESULT CALLED TO, READ BACK BY AND VERIFIED WITH: BEVON BY INGRAMA 12/23 12N Performed at [facility]    Report Status PENDING  Incomplete  Anaerobic culture     Status: None   Collection Time: 09/27/14  1:59 AM  Result Value Ref Range Status   Specimen Description FLUID PERITONEAL  Final   Special Requests NONE  Final   Gram Stain   Final    FEW WBC PRESENT, PREDOMINANTLY PMN NO SQUAMOUS EPITHELIAL CELLS SEEN NO ORGANISMS SEEN Performed at [facility]    Culture   Final    NO ANAEROBES ISOLATED Performed at [facility]    Report Status 10/02/2014 FINAL  Final    Studies/Results: Dg Chest Port 1 View  10/02/2014   CLINICAL DATA:  Respiratory failure.  EXAM: PORTABLE CHEST - 1 VIEW  COMPARISON:  09/30/2014  FINDINGS: Endotracheal tube is 3.9 cm above the carina. Nasogastric tube extends into the abdomen but the tip is beyond the image. Right central line tip in the SVC region. There are increased airspace densities, particularly in the upper lungs. However, there is improved aeration at the lung bases, particularly on the right side. Heart size is normal and stable. Surgical plate in the lower cervical spine. Negative for a pneumothorax.  IMPRESSION: Increased airspace disease in the upper lungs with improved aeration at the lung bases. Findings may be related to edema and/or infection.  Support apparatuses as described.   Electronically Signed   By:  Markus Daft M.D.   On: 10/02/2014 08:06      Assessment/Plan:  Principal Problem:   Pneumoperitoneum Active Problems:   Sepsis   Altered mental state   Brachial artery occlusion   Acute respiratory failure with hypoxia   Severe sepsis   Perforated gastric ulcer    [name] is a 48 y.o. female with  ICU stay from pneumonia, history trach and PEG who presented with 1 day of abdominal pain and found pneumoperitoneum on CT and in OR had perforated prepyloric gastric ulcer. Also with clot in hand requiring thrombectomy in left brachial, radial and ulnar arteries. She has been on TPN and has central line. She has now FUNGEMIA  with Candida. She is also in midst of course of broad spectrum abx for HCAP  #1 Candidemia: While source likely is the peritoneum given that she had perforation and she has yeast from abdominal abscess cultures from Operating room, she also has central line and is getting TPN which are set up for this type of infection and certainly do not make treating it any easier.  IDEALLY we would want to give her a LINE HOLIDAY with no central lines and NO TPN so that we could ensure clearance of her FUNGEMIA but that is very difficult at present given her multiple IV medicines. There also was an attempt at a PICC line placement today which failed a recent vascular events are also probably making things difficult.  It is possible to get a And her remove her old one that would be preferable.   I have reordered repeat blood cultures to ensure that she is clearing her blood stream though as long a she has a central line and were going to need  to assume that the central line is infected with Candida as well.  Infectious disease Society of [place] guidelines typically also call for a formal dedicated ophthalmologic exam. I think that is going to be difficult to obtain obviously on Christmas and the patient has multiple other problems at time besides potential fungal  endophthalmitis. It is not possible however to assess her vision at present. If she stabilizes I would make sure that she does have a formal dilated fundus exam.  She did not require a minimum of 2 weeks of IV antifungal medicines.   #2 Perforated viscus: she been broadly covered with vancomycin cefepime and now micafungin as per discussion below I'm going to stop her vancomycin and cefepime at this point and observe her on micafungin alone. She has had surgical exploration on the 20th and I think we are OK from antibacterial standpoint now.Dr. Biagio Borg description of her dominant cavity from the operating room on the 20th was not compelling for bacterial infection. Again I think given she has had exploratory surgery in 7 days of antibiotic that we likely have gotten her into the clearance from an anti-bacterial antibiotic need  #3 HCAP: She is RE received 7 days of vancomycin and cefepime and I will stop these now.  #3 Screening: will screen for HIV and viral hepatitis panel   LOS: 6 days   Alcide Evener 10/02/2014, 2:28 PM

## 2014-10-03 ENCOUNTER — Inpatient Hospital Stay (HOSPITAL_COMMUNITY): Payer: BLUE CROSS/BLUE SHIELD

## 2014-10-03 DIAGNOSIS — IMO0002 Reserved for concepts with insufficient information to code with codable children: Secondary | ICD-10-CM | POA: Insufficient documentation

## 2014-10-03 DIAGNOSIS — I809 Phlebitis and thrombophlebitis of unspecified site: Secondary | ICD-10-CM | POA: Insufficient documentation

## 2014-10-03 LAB — GLUCOSE, CAPILLARY
GLUCOSE-CAPILLARY: 81 mg/dL (ref 70–99)
GLUCOSE-CAPILLARY: 174 mg/dL — AB (ref 70–99)
Glucose-Capillary: 141 mg/dL — ABNORMAL HIGH (ref 70–99)
Glucose-Capillary: 140 mg/dL — ABNORMAL HIGH (ref 70–99)
Glucose-Capillary: <10 mg/dL — CL (ref 70–99)
Glucose-Capillary: 148 mg/dL — ABNORMAL HIGH (ref 70–99)
Glucose-Capillary: 169 mg/dL — ABNORMAL HIGH (ref 70–99)
Glucose-Capillary: 168 mg/dL — ABNORMAL HIGH (ref 70–99)

## 2014-10-03 LAB — CBC
HCT: 29.8 % — ABNORMAL LOW (ref 36.0–46.0)
Hemoglobin: 9.7 g/dL — ABNORMAL LOW (ref 12.0–15.0)
MCH: 31.8 pg (ref 26.0–34.0)
MCHC: 32.6 g/dL (ref 30.0–36.0)
MCV: 97.7 fL (ref 78.0–100.0)
Platelets: 436 10*3/uL — ABNORMAL HIGH (ref 150–400)
RBC: 3.05 MIL/uL — ABNORMAL LOW (ref 3.87–5.11)
RDW: 13.8 % (ref 11.5–15.5)
WBC: 23.6 10*3/uL — ABNORMAL HIGH (ref 4.0–10.5)

## 2014-10-03 LAB — BODY FLUID CULTURE

## 2014-10-03 LAB — BASIC METABOLIC PANEL
ANION GAP: 5 (ref 5–15)
Anion gap: 6 (ref 5–15)
BUN: <5 mg/dL — ABNORMAL LOW (ref 6–23)
CALCIUM: 7.7 mg/dL — AB (ref 8.4–10.5)
CO2: 27 mmol/L (ref 19–32)
CO2: 30 mmol/L (ref 19–32)
CREATININE: 0.67 mg/dL (ref 0.50–1.10)
Calcium: 7.8 mg/dL — ABNORMAL LOW (ref 8.4–10.5)
Chloride: 108 mEq/L (ref 96–112)
Chloride: 104 mEq/L (ref 96–112)
Creatinine, Ser: 0.72 mg/dL (ref 0.50–1.10)
GFR calc Af Amer: >90 mL/min (ref >90)
GFR calc Af Amer: >90 mL/min (ref >90)
GFR calc non Af Amer: >90 mL/min (ref >90)
GLUCOSE: 138 mg/dL — AB (ref 70–99)
Glucose, Bld: 190 mg/dL — ABNORMAL HIGH (ref 70–99)
Potassium: 2.9 mmol/L — ABNORMAL LOW (ref 3.5–5.1)
Potassium: 3.5 mmol/L (ref 3.5–5.1)
SODIUM: 140 mmol/L (ref 135–145)
Sodium: 140 mmol/L (ref 135–145)

## 2014-10-03 LAB — PHOSPHORUS: Phosphorus: 1.7 mg/dL — ABNORMAL LOW (ref 2.3–4.6)

## 2014-10-03 LAB — HEPARIN LEVEL (UNFRACTIONATED)
HEPARIN UNFRACTIONATED: 0.27 [IU]/mL — AB (ref 0.30–0.70)
HEPARIN UNFRACTIONATED: 0.46 [IU]/mL (ref 0.30–0.70)
Heparin Unfractionated: 0.48 IU/mL (ref 0.30–0.70)

## 2014-10-03 LAB — MAGNESIUM: Magnesium: 1.8 mg/dL (ref 1.5–2.5)

## 2014-10-03 LAB — CULTURE, BLOOD (ROUTINE X 2)

## 2014-10-03 LAB — HEPATITIS PANEL, ACUTE
HCV Ab: NEGATIVE
HEP B C IGM: NONREACTIVE
HEP B S AG: NEGATIVE
Hep A IgM: NONREACTIVE

## 2014-10-03 LAB — HIV ANTIBODY (ROUTINE TESTING W REFLEX): HIV 1&2 Ab, 4th Generation: NONREACTIVE

## 2014-10-03 LAB — TRIGLYCERIDES: Triglycerides: 174 mg/dL — ABNORMAL HIGH (ref <150)

## 2014-10-03 MED ORDER — POTASSIUM PHOSPHATES 15 MMOLE/5ML IV SOLN
30.0000 mmol | Freq: Once | INTRAVENOUS | Status: AC
  Administered 2014-10-03: 30 mmol via INTRAVENOUS
  Filled 2014-10-03: qty 10

## 2014-10-03 MED ORDER — HEPARIN (PORCINE) IN NACL 100-0.45 UNIT/ML-% IJ SOLN
2050.0000 [IU]/h | INTRAMUSCULAR | Status: DC
  Administered 2014-10-03 (×2): 2050 [IU]/h via INTRAVENOUS
  Administered 2014-10-05: 2100 [IU]/h via INTRAVENOUS
  Administered 2014-10-05 – 2014-10-06 (×2): 2000 [IU]/h via INTRAVENOUS
  Filled 2014-10-03 (×8): qty 250

## 2014-10-03 MED ORDER — FUROSEMIDE 10 MG/ML IJ SOLN
40.0000 mg | Freq: Two times a day (BID) | INTRAMUSCULAR | Status: DC
  Administered 2014-10-03 – 2014-10-06 (×8): 40 mg via INTRAVENOUS
  Filled 2014-10-03 (×12): qty 4

## 2014-10-03 MED ORDER — IOHEXOL 300 MG/ML  SOLN
25.0000 mL | INTRAMUSCULAR | Status: AC
  Administered 2014-10-03 (×2): 25 mL via ORAL

## 2014-10-03 MED ORDER — IOHEXOL 300 MG/ML  SOLN
100.0000 mL | Freq: Once | INTRAMUSCULAR | Status: AC | PRN
  Administered 2014-10-03: 100 mL via INTRAVENOUS

## 2014-10-03 MED ORDER — POTASSIUM CHLORIDE 10 MEQ/50ML IV SOLN
10.0000 meq | INTRAVENOUS | Status: AC
  Administered 2014-10-03 (×4): 10 meq via INTRAVENOUS
  Filled 2014-10-03 (×4): qty 50

## 2014-10-03 MED ORDER — TRACE MINERALS CR-CU-F-FE-I-MN-MO-SE-ZN IV SOLN
INTRAVENOUS | Status: AC
  Administered 2014-10-03: 18:00:00 via INTRAVENOUS
  Filled 2014-10-03: qty 2000

## 2014-10-03 MED ORDER — POTASSIUM CHLORIDE 10 MEQ/50ML IV SOLN
10.0000 meq | INTRAVENOUS | Status: AC
  Administered 2014-10-03 (×6): 10 meq via INTRAVENOUS
  Filled 2014-10-03 (×5): qty 50

## 2014-10-03 NOTE — Progress Notes (Signed)
ANTICOAGULATION CONSULT NOTE - Follow Up Consult  Pharmacy Consult for heparin Indication: arterial clot s/p thrombectomy  Allergies  Allergen Reactions  . Ambien [Zolpidem Tartrate] Other (See Comments)    Crazy dreams  . Ceftin [Cefuroxime Axetil] Nausea And Vomiting  . Hctz [Hydrochlorothiazide] Other (See Comments)    Urinary retention  . Hydrocodone Nausea Only  . Lodine [Etodolac] Hives  . Promethazine Other (See Comments)    Hallucinations.   . Roxicodone [Oxycodone Hcl] Swelling  . Codeine Rash  . Latex Rash  . Penicillins Rash    Patient Measurements: Height: 4\' 9"  (144.8 cm) Weight: 172 lb 13.5 oz (78.4 kg) IBW/kg (Calculated) : 38.6 Heparin Dosing Weight: 70 kg  Vital Signs: Temp: 99.6 F (37.6 C) (12/26 0400) Temp Source: Oral (12/26 0400) BP: 171/77 mmHg (12/26 0700) Pulse Rate: 100 (12/26 0700)  Labs:  Recent Labs  10/01/14 0430  10/02/14 0340 10/02/14 1500 10/02/14 2300 10/03/14 0500  HGB 11.2*  --  10.1*  --   --  9.7*  HCT 34.2*  --  30.9*  --   --  29.8*  PLT 186  --  272  --   --  436*  HEPARINUNFRC 0.38  < >  --  0.28* 0.30 0.27*  CREATININE 0.74  --  0.65  --   --  0.72  < > = values in this interval not displayed.  Estimated Creatinine Clearance: 73.2 mL/min (by C-G formula based on Cr of 0.72).   Medications:  Infusions:  . dextrose 5 % and 0.9% NaCl    . Marland KitchenTPN (CLINIMIX-E) Adult 55 mL/hr at 10/03/14 0600   And  . fat emulsion 480 kcal (10/03/14 0600)  . fentaNYL infusion INTRAVENOUS 150 mcg/hr (10/03/14 0600)  . heparin    . midazolam (VERSED) infusion 1 mg/hr (10/03/14 2423)    Assessment: 29 yoF with abdominal pain and N/V found to have a large perforated ulcer. 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 Of note, she underwent a thrombectomy on 12/20 and received intra-arterial TPA.  Heparin level is again SUBtherapeutic at 0.27 after increase in rate. Per nurse, no interruptions in gtt and no  bleeding noted. H/H continues to trend down, plt trend up.   Goal of Therapy:  Heparin level 0.3-0.5 units/ml Monitor platelets by anticoagulation protocol: Yes   Plan:  - Increase heparin drip to 2050 units/hr - 6 hr heparin level - Monitor for s/sx of bleeding  Aleia Larocca K. Velva Harman, PharmD Clinical Pharmacist - Resident Pager: [phone] Pharmacy: [phone] 10/03/2014 7:41 AM

## 2014-10-03 NOTE — Progress Notes (Signed)
[facility]     Day # 4 micafungin   Received 7 days of  vancomycin cefepime  Subjective: Intubated sedated   Antibiotics:  Anti-infectives    Start     Dose/Rate Route Frequency Ordered Stop   09/30/14 1300  micafungin (MYCAMINE) 100 mg in sodium chloride 0.9 % 100 mL IVPB    Comments:  Pharmacy may adjust as needed   100 mg100 mL/hr over 1 Hours Intravenous Every 24 hours 09/30/14 1213     09/27/14 2100  ceFEPIme (MAXIPIME) 2 g in dextrose 5 % 50 mL IVPB  Status:  Discontinued     2 g100 mL/hr over 30 Minutes Intravenous Every 24 hours 09/27/14 0603 09/27/14 1103   09/27/14 2000  vancomycin (VANCOCIN) IVPB 1000 mg/200 mL premix  Status:  Discontinued     1,000 mg200 mL/hr over 60 Minutes Intravenous Every 24 hours 09/27/14 0603 09/27/14 1103   09/27/14 1400  vancomycin (VANCOCIN) IVPB 1000 mg/200 mL premix  Status:  Discontinued     1,000 mg200 mL/hr over 60 Minutes Intravenous Every 12 hours 09/27/14 1103 10/02/14 1348   09/27/14 1200  ceFEPIme (MAXIPIME) 2 g in dextrose 5 % 50 mL IVPB  Status:  Discontinued     2 g100 mL/hr over 30 Minutes Intravenous Every 12 hours 09/27/14 1103 10/02/14 1348   09/26/14 1930  vancomycin (VANCOCIN) IVPB 1000 mg/200 mL premix     1,000 mg200 mL/hr over 60 Minutes Intravenous  Once 09/26/14 1917 09/26/14 2210   09/26/14 1930  ceFEPIme (MAXIPIME) 2 g in dextrose 5 % 50 mL IVPB     2 g100 mL/hr over 30 Minutes Intravenous  Once 09/26/14 1917 09/26/14 2132      Medications: Scheduled Meds: . antiseptic oral rinse  7 mL Mouth Rinse QID  . chlorhexidine  15 mL Mouth Rinse BID  . furosemide  40 mg Intravenous BID  . insulin aspart  0-9 Units Subcutaneous 6 times per day  . micafungin (MYCAMINE) IV  100 mg Intravenous Q24H  . pantoprazole (PROTONIX) IV  40 mg Intravenous Daily   Continuous Infusions: . Marland KitchenTPN (CLINIMIX-E) Adult    . Marland KitchenTPN (CLINIMIX-E) Adult 55 mL/hr at 10/03/14 0700   And  . fat emulsion 240 mL  (10/03/14 0700)  . fentaNYL infusion INTRAVENOUS 150 mcg/hr (10/03/14 1500)  . heparin 2,050 Units/hr (10/03/14 1500)  . midazolam (VERSED) infusion 1 mg/hr (10/03/14 1500)   PRN Meds:.sodium chloride, acetaminophen (TYLENOL) oral liquid 160 mg/5 mL, acetaminophen, fentaNYL, fentaNYL, ipratropium-albuterol, labetalol, ondansetron (ZOFRAN) IV    Objective: Weight change: -2 lb 13.9 oz (-1.3 kg)  Intake/Output Summary (Last 24 hours) at 10/03/14 1716 Last data filed at 10/03/14 1500  Gross per 24 hour  Intake 4127.68 ml  Output   5690 ml  Net -1562.32 ml   Blood pressure 124/65, pulse 77, temperature 97.7 F (36.5 C), temperature source Oral, resp. rate 16, height 4\' 9"  (1.448 m), weight 172 lb 13.5 oz (78.4 kg), SpO2 99 %. Temp:  [97.7 F (36.5 C)-100.8 F (38.2 C)] 97.7 F (36.5 C) (12/26 1700) Pulse Rate:  [71-117] 77 (12/26 1510) Resp:  [16-30] 16 (12/26 1510) BP: (86-179)/(32-82) 124/65 mmHg (12/26 1510) SpO2:  [89 %-99 %] 99 % (12/26 1510) FiO2 (%):  [40 %] 40 % (12/26 1400) Weight:  [172 lb 13.5 oz (78.4 kg)] 172 lb 13.5 oz (78.4 kg) (12/26 0500)  Physical Exam: General: intubated sedated. HEENT:  EOMI CVS tachycardic normal r,  no murmur rubs or gallops Chest: +rhonchi Abdomen: soft distended, positive bowel sounds dressing in place  Neuro: nonfocal  CBC: CBC Latest Ref Rng 10/03/2014 10/02/2014 10/01/2014  WBC 4.0 - 10.5 K/uL 23.6(H) 17.3(H) 16.2(H)  Hemoglobin 12.0 - 15.0 g/dL 9.7(L) 10.1(L) 11.2(L)  Hematocrit 36.0 - 46.0 % 29.8(L) 30.9(L) 34.2(L)  Platelets 150 - 400 K/uL 436(H) 272 186      BMET  Recent Labs  10/02/14 0340 10/03/14 0500  NA 140 140  K 2.9* 2.9*  CL 111 108  CO2 23 27  GLUCOSE 149* 138*  BUN <5* <5*  CREATININE 0.65 0.72  CALCIUM 7.5* 7.7*     Liver Panel   Recent Labs  10/01/14 0430  PROT 4.8*  ALBUMIN 1.6*  AST 49*  ALT 30  ALKPHOS 73  BILITOT 1.0       Sedimentation Rate No results for input(s):  ESRSEDRATE in the last 72 hours. C-Reactive Protein No results for input(s): CRP in the last 72 hours.  Micro Results: Recent Results (from the past 720 hour(s))  Urine culture     Status: None   Collection Time: 09/26/14  7:22 PM  Result Value Ref Range Status   Specimen Description URINE, CATHETERIZED  Final   Special Requests NONE  Final   Culture  Setup Time   Final    09/28/2014 00:50 Performed at [facility] Performed at [facility]   Final   Culture NO GROWTH Performed at [facility]   Final   Report Status 09/29/2014 FINAL  Final  Blood culture (routine x 2)     Status: None   Collection Time: 09/26/14  8:56 PM  Result Value Ref Range Status   Specimen Description BLOOD LEFT ANTECUBITAL DRAWN BY RN  Final   Special Requests BOTTLES DRAWN AEROBIC ONLY 4CC  Final   Culture  Setup Time   Final    09/30/2014 20:55 Performed at [facility]    Culture   Final    CANDIDA GLABRATA Note: Gram Stain Report Called to,Read Back By and Verified With: WILKINSON R([facility])@1140  ON 09/30/14 BY Elza Rafter. Performed at [facility] Performed at [facility]    Report Status 10/03/2014 FINAL  Final  Blood culture (routine x 2)     Status: None (Preliminary result)   Collection Time: 09/26/14  9:00 PM  Result Value Ref Range Status   Specimen Description BLOOD LEFT ANTECUBITAL DRAWN BY RN  Final   Special Requests BOTTLES DRAWN AEROBIC ONLY 4CC  Final   Culture   Final    YEAST Note: Gram Stain Report Called to,Read Back By and Verified With: DICKENS B AT [facility] [place] ON [id number] BY FORSYTH K Performed at [facility]    Report Status PENDING  Incomplete  MRSA PCR Screening     Status: None   Collection Time: 09/26/14 11:30 PM  Result Value Ref Range Status   MRSA by PCR NEGATIVE NEGATIVE Final    Comment:        The GeneXpert MRSA Assay (FDA approved for NASAL specimens only), is one component of  a comprehensive MRSA colonization surveillance program. It is not intended to diagnose MRSA infection nor to guide or monitor treatment for MRSA infections.   Body fluid culture     Status: None   Collection Time: 09/27/14  1:59 AM  Result Value Ref Range Status   Specimen Description FLUID PERITONEAL  Final   Special Requests ON SWAB  Final   Gram Stain   Final    RARE WBC PRESENT, PREDOMINANTLY PMN NO ORGANISMS SEEN Performed at [facility]    Culture   Final    FEW CANDIDA GLABRATA Note: CRITICAL RESULT CALLED TO, READ BACK BY AND VERIFIED WITH: BEVON BY INGRAMA 12/23 12N Performed at [facility]    Report Status 10/03/2014 FINAL  Final  Anaerobic culture     Status: None   Collection Time: 09/27/14  1:59 AM  Result Value Ref Range Status   Specimen Description FLUID PERITONEAL  Final   Special Requests NONE  Final   Gram Stain   Final    FEW WBC PRESENT, PREDOMINANTLY PMN NO SQUAMOUS EPITHELIAL CELLS SEEN NO ORGANISMS SEEN Performed at [facility]    Culture   Final    NO ANAEROBES ISOLATED Performed at [facility]    Report Status 10/02/2014 FINAL  Final  Culture, blood (routine x 2)     Status: None (Preliminary result)   Collection Time: 10/01/14  9:24 PM  Result Value Ref Range Status   Specimen Description BLOOD LEFT HAND  Final   Special Requests BOTTLES DRAWN AEROBIC ONLY 10CC  Final   Culture   Final           BLOOD CULTURE RECEIVED NO GROWTH TO DATE CULTURE WILL BE HELD FOR 5 DAYS BEFORE ISSUING A FINAL NEGATIVE REPORT Performed at [facility]    Report Status PENDING  Incomplete  Culture, blood (routine x 2)     Status: None (Preliminary result)   Collection Time: 10/01/14  9:35 PM  Result Value Ref Range Status   Specimen Description BLOOD RIGHT HAND  Final   Special Requests BOTTLES DRAWN AEROBIC ONLY 10CC  Final   Culture   Final           BLOOD CULTURE RECEIVED NO GROWTH TO DATE CULTURE WILL BE HELD  FOR 5 DAYS BEFORE ISSUING A FINAL NEGATIVE REPORT Performed at [facility]    Report Status PENDING  Incomplete    Studies/Results: Ct Abdomen Pelvis W Contrast  10/03/2014   CLINICAL DATA:  Six days postop from perforated gastric ulcer. Fever.  EXAM: CT ABDOMEN AND PELVIS WITH CONTRAST  TECHNIQUE: Multidetector CT imaging of the abdomen and pelvis was performed using the standard protocol following bolus administration of intravenous contrast.  CONTRAST:  180mL OMNIPAQUE IOHEXOL 300 MG/ML  SOLN  COMPARISON:  09/26/2014  FINDINGS: Patchy bibasilar airspace disease.  Small left pleural effusion.  NG tube tip in the body of the stomach  Surgical drain is in place within the lesser sac.  Free fluid is present in the left side of the abdomen and extending into the left pericolic gutter.  High-density material is present in the gallbladder.  Diffuse hepatic steatosis  Spleen, pancreas, adrenal glands, and kidneys are within normal limits  The abdominal wall has been sewn together. Overlying subcutaneous fat has been left open to heal by secondary intention.  Small amount of free fluid in the anterior pelvis.  Foley catheter decompresses the bladder.  There is free fluid layering in the pelvis. There is enhancement of the peritoneal lining surrounding the free-fluid in the pelvis. There are no gas bubbles. It does not appear loculated. The free-fluid in the pelvis measures 5.2 x 4.2 cm.  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  IMPRESSION: Bibasilar airspace disease.  Small left pleural  effusion.  Postop changes with surgical drains in place.  There is scattered free fluid in the abdomen and pelvis. There is some enhancement of the peritoneal lining about the free-fluid in the pelvis. See above for details.   Electronically Signed   By: Maryclare Bean M.D.   On: 10/03/2014 13:47   Dg Chest Port 1 View  10/03/2014   CLINICAL DATA:  Acute respiratory failure  EXAM: PORTABLE CHEST - 1 VIEW   COMPARISON:  Yesterday  FINDINGS: Tubular device is stable. Diffuse bilateral airspace disease improved. Low volumes. No pneumothorax. Normal heart size.  IMPRESSION: Improved bilateral airspace disease.   Electronically Signed   By: Maryclare Bean M.D.   On: 10/03/2014 08:20   Dg Chest Port 1 View  10/02/2014   CLINICAL DATA:  Respiratory failure.  EXAM: PORTABLE CHEST - 1 VIEW  COMPARISON:  09/30/2014  FINDINGS: Endotracheal tube is 3.9 cm above the carina. Nasogastric tube extends into the abdomen but the tip is beyond the image. Right central line tip in the SVC region. There are increased airspace densities, particularly in the upper lungs. However, there is improved aeration at the lung bases, particularly on the right side. Heart size is normal and stable. Surgical plate in the lower cervical spine. Negative for a pneumothorax.  IMPRESSION: Increased airspace disease in the upper lungs with improved aeration at the lung bases. Findings may be related to edema and/or infection.  Support apparatuses as described.   Electronically Signed   By: Markus Daft M.D.   On: 10/02/2014 08:06      Assessment/Plan:  Principal Problem:   Pneumoperitoneum Active Problems:   Sepsis   Altered mental state   Brachial artery occlusion   Acute respiratory failure with hypoxia   Severe sepsis   Perforated gastric ulcer   Acute respiratory failure   Artery occlusion   Blood poisoning   Candidemia   Screen for STD (sexually transmitted disease)    [name] is a 48 y.o. female with  ICU stay from pneumonia, history trach and PEG who presented with 1 day of abdominal pain and found pneumoperitoneum on CT and in OR had perforated prepyloric gastric ulcer. Also with clot in hand requiring thrombectomy in left brachial, radial and ulnar arteries. She has been on TPN and has central line. She has now FUNGEMIA  with Candida. She has  also been given 7 days of f broad spectrum abx for HCAP and her perforated  abdomen--I stopped these yestereday  #1 Candidemia: Glabrata which  Is at times NOT AZOLE sensitive While source likely is the peritoneum given that she had perforation and she has yeast from abdominal abscess cultures from Operating room, she also has central line and is getting TPN which are set up for this type of infection and certainly do not make treating it any easier.  IDEALLY we would want to give her a LINE HOLIDAY with no central lines and NO TPN so that we could ensure clearance of her FUNGEMIA but that is very difficult at present given her multiple IV medicines. There also was an attempt at a PICC line placement today which failed a recent vascular events are also probably making things difficult.   Infectious disease Society of [place] guidelines typically also call for a formal dedicated ophthalmologic exam.  Given that she presented with FUNGEMIA and a thrombus should consider if this is fungal septic thrombophlebitis  I would therefore give her one MONTH of IV MICAFUNGIN with start  date being some day in the future when we can give her a "line holiday" and get repeat blood cultures with no line in place   #2 Perforated viscus:  I took her off antibacterial therapy yesterday.   CT scan shows presence of fluid with enhancing rim in abdomen still present  I WOULD RECOMMEND ASKING IR TO DRAIN THIS AND SEND MATERIAL FOR BACTERIAL AND FUNGAL CULTURES  #3 HCAP: She is SP  7 days of vancomycin and cefepime . IF CONCERN rises again for HCAP would send tracheal aspirate vs BAL for cultures  #3 Screening: HIV and viral hepatitis panel negative   LOS: 7 days   Alcide Evener 10/03/2014, 5:16 PM

## 2014-10-03 NOTE — Progress Notes (Signed)
eLink Physician-Brief Progress Note Patient Name: [name] DOB: 1966-05-11 MRN: [id number]   Date of Service  10/03/2014  HPI/Events of Note  Request to renew soft restraints  eICU Interventions  renewed     Intervention Category Minor Interventions: Routine modifications to care plan (e.g. PRN medications for pain, fever)  Christinia Gully 10/03/2014, 10:56 PM

## 2014-10-03 NOTE — Progress Notes (Signed)
PULMONARY / CRITICAL CARE MEDICINE HISTORY AND PHYSICAL EXAMINATION   Name: [name] MRN: [id number] DOB: [birth year]    ADMISSION DATE:  09/26/2014  PRIMARY SERVICE: PCCM  CHIEF COMPLAINT:  Abd pain  BRIEF PATIENT DESCRIPTION: 33yof smoker with PMH of MS, depression presents with abd pain, found to have pneumoperitoneum 2/2 small bowel rupture, also with new cold L hand.  Going to OR imminently.    SIGNIFICANT EVENTS / STUDIES:  09/26/14: Admitted, CT Abd with pneumoperitoneum, cold L hand concerning for arterial clot as well. 12/20 > ex-lap [name]) with omental patch for perforated prepyloric gastric ulcer; L brachial and ulnar artery embolectomy [name]) 12/20 LE doppler >>neg 12/20 >>Left brachial to left radial artery bypass with left leg greater saphenous vein for Recurrent occlusion  12/20 Self extubated - had to be reintubated 12/21 echo >>nml LVEF 12/23 TEE ordered 12/24 TEE not yet completed  LINES / TUBES: R IJ CVL 12/20 >>  JP drain RUQ 12/20 >>  ETT 12/19 >>    SUBJECTIVE: febrile Calmer on WUA this am. TEE not yet done. Good Uo but equal balance  VITAL SIGNS: Temp:  [99.5 F (37.5 C)-100.8 F (38.2 C)] 99.6 F (37.6 C) (12/26 0400) Pulse Rate:  [89-117] 100 (12/26 0700) Resp:  [17-37] 30 (12/26 0700) BP: (93-171)/(57-122) 171/77 mmHg (12/26 0700) SpO2:  [89 %-100 %] 93 % (12/26 0700) FiO2 (%):  [40 %] 40 % (12/26 0312) Weight:  [78.4 kg (172 lb 13.5 oz)] 78.4 kg (172 lb 13.5 oz) (12/26 0500) HEMODYNAMICS:    Vent Mode:  [-] PRVC FiO2 (%):  [40 %] 40 % Set Rate:  [16 bmp] 16 bmp Vt Set:  [450 mL] 450 mL PEEP:  [5 cmH20] 5 cmH20 Pressure Support:  [5 cmH20-10 cmH20] 10 cmH20 Plateau Pressure:  [17 cmH20-18 cmH20] 17 cmH20 INTAKE / OUTPUT: Intake/Output      12/25 0701 - 12/26 0700 12/26 0701 - 12/27 0700   I.V. (mL/kg) 1426.2 (18.2)    NG/GT 30    IV Piggyback 1060    TPN 1315.8    Total Intake(mL/kg) 3832 (48.9)    Urine (mL/kg/hr)  3815 (2)    Emesis/NG output 150 (0.1)    Drains 75 (0)    Total Output 4040     Net -208            PHYSICAL EXAMINATION:  Gen: RASS +1 to -2, not F/C HEENT: WNL PULM: Clear anteriorly CV: Reg, no  AB: absent BS, surgical dressing Ext: warm, no edema Neuro: no focal deficits  LABS: I have reviewed all of today's lab results. Relevant abnormalities are discussed in the A/P section  CXR: edema pattern  ASSESSMENT / PLAN:  Principal Problem:   Pneumoperitoneum Active Problems:   Sepsis   Altered mental state   Brachial artery occlusion   Acute respiratory failure with hypoxia   Severe sepsis   Perforated gastric ulcer   Acute respiratory failure   Artery occlusion   Blood poisoning   Candidemia   Screen for STD (sexually transmitted disease)   PULMONARY A: ETT 12/19 >>  Acute respiratory failure Pulm infiltrates P:   Cont full vent support - settings reviewed and/or adjusted Cont vent bundle Daily SBT if/when meets criteria -OK to wean on low dose sedation diurese  CARDIOVASCULAR A: Sinus tachycardia, reactive Hypertension, controlled Multiple thromboemboli P:   Cont labetalol prn Cont heparin gtt TEE pending  RENAL A: AKI, resolved Hypokalemia/hypophos P:   Monitor BMET intermittently  Monitor I/Os Correct electrolytes as indicated  GASTROINTESTINAL A: Perforated gastric ulcer and pneumoperitoneum  Post laparotomy P:   Post op care per surgery Cont TPN Cont SUP  HEMATOLOGIC A: Very mild thrombocytopenia P:   DVT px: full heparin Monitor CBC intermittently Transfuse per usual ICU guidelines  INFECTIOUS A: Peritonitis after bowel perforation Severe sepsis Fungemia -unclear source P:   Blood cx: 09/26/14-->  NGTD 12/20 peritoneal fluid >> few yeast  Vanc: 12/19 >> 12/25 Zosyn:  12/19 >> 12/25 Micafungin 12/23 >>   ID following ,consider changing lines if fever persists -doubt line sepsis since cx on admit growing  yeast  ENDOCRINE A:  Hypoglycemia, resolved Intermittent hyperglycemia P:   Cont SSI  NEUROLOGIC A: Post op pain Acute encephalopathy/ delerium P:   RASS goal-1, -2 Cont PAD protocol , versed gtt Holding home SSRI and benzo   Social / Family:   TODAY'S SUMMARY: Need diuresis & better control of delerium for extubation, TEE pending   The patient is critically ill with multiple organ systems failure and requires high complexity decision making for assessment and support, frequent evaluation and titration of therapies, application of advanced monitoring technologies and extensive interpretation of multiple databases. Critical Care Time devoted to patient care services described in this note independent of APP time is 35 minutes.    [name] MD  10/03/2014, 8:05 AM

## 2014-10-03 NOTE — Progress Notes (Signed)
5 cc of Fentanyl drip wasted in sink with Vista Lawman, RN.

## 2014-10-03 NOTE — Progress Notes (Signed)
6 Days Post-Op  Subjective: Low grade fever. Increasing wbc.   Objective: Vital signs in last 24 hours: Temp:  [99.5 F (37.5 C)-100.8 F (38.2 C)] 99.6 F (37.6 C) (12/26 0400) Pulse Rate:  [89-117] 100 (12/26 0700) Resp:  [17-37] 30 (12/26 0700) BP: (93-171)/(57-122) 171/77 mmHg (12/26 0700) SpO2:  [89 %-100 %] 93 % (12/26 0700) FiO2 (%):  [40 %] 40 % (12/26 0800) Weight:  [172 lb 13.5 oz (78.4 kg)] 172 lb 13.5 oz (78.4 kg) (12/26 0500)    Intake/Output from previous day: 12/25 0701 - 12/26 0700 In: 3832 [I.V.:1426.2; NG/GT:30; IV Piggyback:1060; TPN:1315.8] Out: 4040 [Urine:3815; Emesis/NG output:150; Drains:75] Intake/Output this shift: Total I/O In: 30 [NG/GT:30] Out: 175 [Urine:125; Drains:50]  Intubated. Doesn't really FC. Eye open spont cta ant b/l Reg Distended, soft, doesn't grimace. Drain - serosang  Lab Results:   Recent Labs  10/02/14 0340 10/03/14 0500  WBC 17.3* 23.6*  HGB 10.1* 9.7*  HCT 30.9* 29.8*  PLT 272 436*   BMET  Recent Labs  10/02/14 0340 10/03/14 0500  NA 140 140  K 2.9* 2.9*  CL 111 108  CO2 23 27  GLUCOSE 149* 138*  BUN <5* <5*  CREATININE 0.65 0.72  CALCIUM 7.5* 7.7*   PT/INR No results for input(s): LABPROT, INR in the last 72 hours. ABG  Recent Labs  10/02/14 0920  PHART 7.249*  HCO3 21.9    Studies/Results: Dg Chest Port 1 View  10/03/2014   CLINICAL DATA:  Acute respiratory failure  EXAM: PORTABLE CHEST - 1 VIEW  COMPARISON:  Yesterday  FINDINGS: Tubular device is stable. Diffuse bilateral airspace disease improved. Low volumes. No pneumothorax. Normal heart size.  IMPRESSION: Improved bilateral airspace disease.   Electronically Signed   By: Maryclare Bean M.D.   On: 10/03/2014 08:20   Dg Chest Port 1 View  10/02/2014   CLINICAL DATA:  Respiratory failure.  EXAM: PORTABLE CHEST - 1 VIEW  COMPARISON:  09/30/2014  FINDINGS: Endotracheal tube is 3.9 cm above the carina. Nasogastric tube extends into the abdomen but  the tip is beyond the image. Right central line tip in the SVC region. There are increased airspace densities, particularly in the upper lungs. However, there is improved aeration at the lung bases, particularly on the right side. Heart size is normal and stable. Surgical plate in the lower cervical spine. Negative for a pneumothorax.  IMPRESSION: Increased airspace disease in the upper lungs with improved aeration at the lung bases. Findings may be related to edema and/or infection.  Support apparatuses as described.   Electronically Signed   By: Markus Daft M.D.   On: 10/02/2014 08:06    Anti-infectives: Anti-infectives    Start     Dose/Rate Route Frequency Ordered Stop   09/30/14 1300  micafungin (MYCAMINE) 100 mg in sodium chloride 0.9 % 100 mL IVPB    Comments:  Pharmacy may adjust as needed   100 mg100 mL/hr over 1 Hours Intravenous Every 24 hours 09/30/14 1213     09/27/14 2100  ceFEPIme (MAXIPIME) 2 g in dextrose 5 % 50 mL IVPB  Status:  Discontinued     2 g100 mL/hr over 30 Minutes Intravenous Every 24 hours 09/27/14 0603 09/27/14 1103   09/27/14 2000  vancomycin (VANCOCIN) IVPB 1000 mg/200 mL premix  Status:  Discontinued     1,000 mg200 mL/hr over 60 Minutes Intravenous Every 24 hours 09/27/14 0603 09/27/14 1103   09/27/14 1400  vancomycin (VANCOCIN) IVPB 1000 mg/200 mL  premix  Status:  Discontinued     1,000 mg200 mL/hr over 60 Minutes Intravenous Every 12 hours 09/27/14 1103 10/02/14 1348   09/27/14 1200  ceFEPIme (MAXIPIME) 2 g in dextrose 5 % 50 mL IVPB  Status:  Discontinued     2 g100 mL/hr over 30 Minutes Intravenous Every 12 hours 09/27/14 1103 10/02/14 1348   09/26/14 1930  vancomycin (VANCOCIN) IVPB 1000 mg/200 mL premix     1,000 mg200 mL/hr over 60 Minutes Intravenous  Once 09/26/14 1917 09/26/14 2210   09/26/14 1930  ceFEPIme (MAXIPIME) 2 g in dextrose 5 % 50 mL IVPB     2 g100 mL/hr over 30 Minutes Intravenous  Once 09/26/14 1917 09/26/14 2132       Assessment/Plan: s/p Procedure(s): Left Radial, Brachial, and Ulnar Thrombectomy; Left Brachial to Radial Bypass Graft using Greater Saphenous vein graft from Left Thigh; Left Saphenous Vein Harvest; Intraoperative Arteriogram; Intra-arterial administration of TPA (Left) S/p primary repair of perf gastric ulcer with graham patch  Low grade fever, increasing wbc - could be yeast in blood or intra-abd process (abscess). No sign of enteric drainage in drain.   But will check CT with contrast to evaluate. If no evidence of leak on CT, can start trophic tube feeds  Leighton Ruff. Redmond Pulling, MD, FACS General, Bariatric, & Minimally Invasive Surgery [facility], [place]   LOS: 7 days    Gayland Curry 10/03/2014

## 2014-10-03 NOTE — Plan of Care (Signed)
Problem: Phase II Progression Outcomes Goal: Pain controlled Outcome: Progressing Continues on fentanyl drip Goal: Progress activity as tolerated unless otherwise ordered Outcome: Not Progressing Pt remains on bedrest

## 2014-10-03 NOTE — Progress Notes (Signed)
eLink Physician-Brief Progress Note Patient Name: [name] DOB: January 22, 1966 MRN: [id number]   Date of Service  10/03/2014  HPI/Events of Note  Hypokalemia and hypophos  eICU Interventions  Potassium and phos replaced     Intervention Category Intermediate Interventions: Electrolyte abnormality - evaluation and management  DETERDING,ELIZABETH 10/03/2014, 6:01 AM

## 2014-10-03 NOTE — Progress Notes (Signed)
eLink Physician-Brief Progress Note Patient Name: [name] DOB: 1966-01-30 MRN: [id number]   Date of Service  10/03/2014  HPI/Events of Note  Hypokalemia with ongoing high uop, 3.5 p last replacement in   eICU Interventions  KCL x 4 more runs      Intervention Category Major Interventions: Electrolyte abnormality - evaluation and management  Christinia Gully 10/03/2014, 7:41 PM

## 2014-10-03 NOTE — Progress Notes (Signed)
10 cc of Fentanyl drip wasted in sink with Vista Lawman, RN.

## 2014-10-03 NOTE — Progress Notes (Addendum)
ANTICOAGULATION CONSULT NOTE - Follow Up Consult  Pharmacy Consult for heparin Indication: arterial clot s/p thrombectomy  Allergies  Allergen Reactions  . Ambien [Zolpidem Tartrate] Other (See Comments)    Crazy dreams  . Ceftin [Cefuroxime Axetil] Nausea And Vomiting  . Hctz [Hydrochlorothiazide] Other (See Comments)    Urinary retention  . Hydrocodone Nausea Only  . Lodine [Etodolac] Hives  . Promethazine Other (See Comments)    Hallucinations.   . Roxicodone [Oxycodone Hcl] Swelling  . Codeine Rash  . Latex Rash  . Penicillins Rash    Patient Measurements: Height: 4\' 9"  (144.8 cm) Weight: 172 lb 13.5 oz (78.4 kg) IBW/kg (Calculated) : 38.6 Heparin Dosing Weight: 70 kg  Vital Signs: Temp: 99.6 F (37.6 C) (12/26 0400) Temp Source: Oral (12/26 0400) BP: 124/65 mmHg (12/26 1510) Pulse Rate: 77 (12/26 1510)  Labs:  Recent Labs  10/01/14 0430  10/02/14 0340  10/02/14 2300 10/03/14 0500 10/03/14 1400  HGB 11.2*  --  10.1*  --   --  9.7*  --   HCT 34.2*  --  30.9*  --   --  29.8*  --   PLT 186  --  272  --   --  436*  --   HEPARINUNFRC 0.38  < >  --   < > 0.30 0.27* 0.46  CREATININE 0.74  --  0.65  --   --  0.72  --   < > = values in this interval not displayed.  Estimated Creatinine Clearance: 73.2 mL/min (by C-G formula based on Cr of 0.72).   Medications:  Infusions:  . Marland KitchenTPN (CLINIMIX-E) Adult    . Marland KitchenTPN (CLINIMIX-E) Adult 55 mL/hr at 10/03/14 0700   And  . fat emulsion 240 mL (10/03/14 0700)  . fentaNYL infusion INTRAVENOUS 150 mcg/hr (10/03/14 1500)  . heparin 2,050 Units/hr (10/03/14 1500)  . midazolam (VERSED) infusion 1 mg/hr (10/03/14 1500)    Assessment: 20 yoF with abdominal pain and N/V found to have a large perforated ulcer. 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 Of note, she underwent a thrombectomy on 12/20 and received intra-arterial TPA.  Heparin level is now therapeutic x 1 at 0.46 after another rate  increase this am. H/H continues to trend down, plt trend up.   Goal of Therapy:  Heparin level 0.3-0.5 units/ml Monitor platelets by anticoagulation protocol: Yes   Plan:  - Continue heparin drip at 2050 units/hr - 6 hr heparin level to confirm - Monitor for s/sx of bleeding  Erika K. Velva Harman, PharmD Clinical Pharmacist - Resident Pager: [phone] Pharmacy: [phone] 10/03/2014 3:23 PM  Addendum: Follow up HL remains therapeutic at 0.48 on 2050 units/hr of heparin. RN reports some oozing from central line site. No other s/s of bleeding noted. Continue heparin infusion at current rate and monitor daily HL, CBC and s/s of bleeding   Albertina Parr, PharmD., BCPS Clinical Pharmacist Pager [phone]

## 2014-10-03 NOTE — Progress Notes (Signed)
PARENTERAL NUTRITION CONSULT NOTE - FOLLOW UP  Pharmacy Consult for TPN Indication: S/P exp lap for perforated gastric ulcer  Allergies  Allergen Reactions  . Ambien [Zolpidem Tartrate] Other (See Comments)    Crazy dreams  . Ceftin [Cefuroxime Axetil] Nausea And Vomiting  . Hctz [Hydrochlorothiazide] Other (See Comments)    Urinary retention  . Hydrocodone Nausea Only  . Lodine [Etodolac] Hives  . Promethazine Other (See Comments)    Hallucinations.   . Roxicodone [Oxycodone Hcl] Swelling  . Codeine Rash  . Latex Rash  . Penicillins Rash    Patient Measurements: Height: 4\' 9"  (144.8 cm) Weight: 172 lb 13.5 oz (78.4 kg) IBW/kg (Calculated) : 38.6 Adjusted Body Weight:  Usual Weight:   Vital Signs: Temp: 99.6 F (37.6 C) (12/26 0400) Temp Source: Oral (12/26 0400) BP: 162/59 mmHg (12/26 0800) Pulse Rate: 98 (12/26 0800) Intake/Output from previous day: 12/25 0701 - 12/26 0700 In: 3922 [I.V.:1451.2; NG/GT:30; IV Piggyback:1060; TPN:1380.8] Out: 4040 [Urine:3815; Emesis/NG output:150; Drains:75] Intake/Output from this shift: Total I/O In: 113.2 [I.V.:33.2; NG/GT:30; IV Piggyback:50] Out: 175 [Urine:125; Drains:50]  Labs:  Recent Labs  10/01/14 0430 10/02/14 0340 10/03/14 0500  WBC 16.2* 17.3* 23.6*  HGB 11.2* 10.1* 9.7*  HCT 34.2* 30.9* 29.8*  PLT 186 272 436*     Recent Labs  10/01/14 0430 10/02/14 0340 10/03/14 0500  NA 139 140 140  K 3.1* 2.9* 2.9*  CL 113* 111 108  CO2 20 23 27   GLUCOSE 109* 149* 138*  BUN <5* <5* <5*  CREATININE 0.74 0.65 0.72  CALCIUM 7.2* 7.5* 7.7*  MG 1.4* 1.8 1.8  PHOS 2.4  --  1.7*  PROT 4.8*  --   --   ALBUMIN 1.6*  --   --   AST 49*  --   --   ALT 30  --   --   ALKPHOS 73  --   --   BILITOT 1.0  --   --   PREALBUMIN 3.6*  --   --    Estimated Creatinine Clearance: 73.2 mL/min (by C-G formula based on Cr of 0.72).    Recent Labs  10/02/14 1936 10/02/14 2315 10/03/14 0340  GLUCAP 132* 148* 169*     Medications:  Scheduled:  . antiseptic oral rinse  7 mL Mouth Rinse QID  . chlorhexidine  15 mL Mouth Rinse BID  . furosemide  40 mg Intravenous BID  . insulin aspart  0-9 Units Subcutaneous 6 times per day  . micafungin (MYCAMINE) IV  100 mg Intravenous Q24H  . pantoprazole (PROTONIX) IV  40 mg Intravenous Daily  . potassium chloride  10 mEq Intravenous Q1 Hr x 6  . potassium phosphate IVPB (mmol)  30 mmol Intravenous Once    Insulin Requirements: 6 units Novolog SSI/24hr, no insulin in TPN  Nutritional Requirements: [phone] Kcal with 60-65g protein per RD recommendations (22-25 kcals/kg ideal body weight) based on ASPEN guidelines for hypocaloric, high protein feeding in critically ill obese individuals  Current Nutrition: Clinimix-E 5/20 at 16ml/hr provides 1056 kcal and 60g protein  Admit:  Admitted 12/20 with abd pain with vomiting/fevers. (+)pneumoperitoneum 2nd SB rupture.  GI: S/P exp lap/oversew of perforated pre-pyloric gastric ulcer; omental patch 12/20. (+)Hx GERD. NGT with 125mL out and JP drain with 71mL out yesterday.Plan for UGI on at least POD#5 (today is POD#5) prior to feeding.  CT abd today- to start trophic feeds if (-)leak. PPI IV. Prealbumin 3.6 at baseline (low but noted recent surgery).  Abd soft, distended.  Endo: Borderline DM.CBGs 132-169 - one episode of hypoglycemia 12/24 0900  Lytes: K 2.9 & Phos 1.7 (K runs x 6 & K-Phos 69mmol ordered per MD), Corr Ca 9.6  Renal: Cr stable, UOP 2 ml/kg/hr. I/O ~even yesterday  Pulm: Hx COPD, chronic bronchitis. Acute resp failure, intubated - fio2 40%.   Cards:BP variable. HR 90s.   AC: S/P ulnar artery embolectomy 12/20, on Heparin IV.  Hepatobil: AST mildly elevated; other LFTs wnl. TG pending for the a.m.  Neuro: Hx MS, depression, periph neuropathy.Nicotine patch, Fent and Versed infusions. RASS -2 (goal -2).  ID: Micafungin (12/23 >> ) for yeast in blood and peritoneal fluid. Tm  100.8, WBC 23.6- cont trending up.   Best Practices: MC, SCDs  TPN Access: R-IJ CVL  TPN day#: 12/23 >>  Plan: Continue Clinimix to E 5/15 at 41ml/hr and 20% lipid emulsion on Mon, Wed, Fri to provide 1143 kcal and 66g protein. This will meet 100% of patients estimated needs. BMET, Phos in AM Hopefully can begin tube feeds tomorrow and transition patient off TPN especially with fungemia.   Gracy Bruins, PharmD Clinical Pharmacist [facility]

## 2014-10-03 NOTE — Progress Notes (Addendum)
   [facility]  Subjective  - Intubated and sedated.   Objective 171/77 100 99.6 F (37.6 C) (Oral) 30 93%  Intake/Output Summary (Last 24 hours) at 10/03/14 8850 Last data filed at 10/03/14 0700  Gross per 24 hour  Intake 3576.98 ml  Output   3840 ml  Net -263.02 ml    Palpable radial and DP pulses bilateral Incisions healing well No acute changes to fingers bilaterally  Assessment/Planning: S/P Left Radial, Brachial, and Ulnar Thrombectomy; Left Brachial to Radial Bypass Graft using Greater Saphenous vein graft from Left Thigh; Left Saphenous Vein Harvest; Intraoperative Arteriogram; Intra-arterial administration of TPA (Left) followed by redo. Heparin IV  Lungs intubated WBC increased 23.6  from 17.3 Hypokalemia 2.9 repleted per CCM this am order Echo has not been completed yet reordered today the patient is on heparin and still intubated the Echo can be completed at a later date when she is stable.    Laurence Slate [facility] 10/03/2014 8:12 AM --  Laboratory Lab Results:  Recent Labs  10/02/14 0340 10/03/14 0500  WBC 17.3* 23.6*  HGB 10.1* 9.7*  HCT 30.9* 29.8*  PLT 272 436*   BMET  Recent Labs  10/02/14 0340 10/03/14 0500  NA 140 140  K 2.9* 2.9*  CL 111 108  CO2 23 27  GLUCOSE 149* 138*  BUN <5* <5*  CREATININE 0.65 0.72  CALCIUM 7.5* 7.7*    COAG No results found for: INR, PROTIME No results found for: PTT  3+ vein graft pulse in left upper extremity and 3+ radial pulse palpable right upper extremity. Some blistering on the palmar aspect of left hand base of fourth and fifth digits no evidence of cellulitis or drainage. Mild duskiness of distal digits bilaterally Patient currently being diuresed by CCM with plans to possibly extubate tomorrow Currently on heparin drip and stable-etiology of embolic events unknown

## 2014-10-04 ENCOUNTER — Inpatient Hospital Stay (HOSPITAL_COMMUNITY): Payer: BLUE CROSS/BLUE SHIELD

## 2014-10-04 DIAGNOSIS — R509 Fever, unspecified: Secondary | ICD-10-CM | POA: Insufficient documentation

## 2014-10-04 DIAGNOSIS — D72829 Elevated white blood cell count, unspecified: Secondary | ICD-10-CM | POA: Insufficient documentation

## 2014-10-04 DIAGNOSIS — I809 Phlebitis and thrombophlebitis of unspecified site: Secondary | ICD-10-CM

## 2014-10-04 LAB — CULTURE, BLOOD (ROUTINE X 2)

## 2014-10-04 LAB — GLUCOSE, CAPILLARY
GLUCOSE-CAPILLARY: 116 mg/dL — AB (ref 70–99)
GLUCOSE-CAPILLARY: 119 mg/dL — AB (ref 70–99)
GLUCOSE-CAPILLARY: 139 mg/dL — AB (ref 70–99)
GLUCOSE-CAPILLARY: 155 mg/dL — AB (ref 70–99)
Glucose-Capillary: 126 mg/dL — ABNORMAL HIGH (ref 70–99)
Glucose-Capillary: 86 mg/dL (ref 70–99)

## 2014-10-04 LAB — HEPARIN LEVEL (UNFRACTIONATED)
HEPARIN UNFRACTIONATED: 0.24 [IU]/mL — AB (ref 0.30–0.70)
Heparin Unfractionated: 0.5 IU/mL (ref 0.30–0.70)
Heparin Unfractionated: 0.59 IU/mL (ref 0.30–0.70)

## 2014-10-04 LAB — BASIC METABOLIC PANEL
Anion gap: 10 (ref 5–15)
CHLORIDE: 99 meq/L (ref 96–112)
CO2: 32 mmol/L (ref 19–32)
Calcium: 8 mg/dL — ABNORMAL LOW (ref 8.4–10.5)
Creatinine, Ser: 0.68 mg/dL (ref 0.50–1.10)
GFR calc Af Amer: >90 mL/min (ref >90)
GFR calc non Af Amer: >90 mL/min (ref >90)
GLUCOSE: 118 mg/dL — AB (ref 70–99)
Potassium: 3.6 mmol/L (ref 3.5–5.1)
Sodium: 141 mmol/L (ref 135–145)

## 2014-10-04 LAB — CBC
HCT: 31.5 % — ABNORMAL LOW (ref 36.0–46.0)
HEMOGLOBIN: 10.1 g/dL — AB (ref 12.0–15.0)
MCH: 31.4 pg (ref 26.0–34.0)
MCHC: 32.1 g/dL (ref 30.0–36.0)
MCV: 97.8 fL (ref 78.0–100.0)
Platelets: 588 10*3/uL — ABNORMAL HIGH (ref 150–400)
RBC: 3.22 MIL/uL — AB (ref 3.87–5.11)
RDW: 13.6 % (ref 11.5–15.5)
WBC: 32.4 10*3/uL — ABNORMAL HIGH (ref 4.0–10.5)

## 2014-10-04 LAB — PHOSPHORUS: Phosphorus: 3.2 mg/dL (ref 2.3–4.6)

## 2014-10-04 LAB — MAGNESIUM
MAGNESIUM: 2.5 mg/dL (ref 1.5–2.5)
Magnesium: 1.5 mg/dL (ref 1.5–2.5)

## 2014-10-04 LAB — POTASSIUM: POTASSIUM: 3.3 mmol/L — AB (ref 3.5–5.1)

## 2014-10-04 MED ORDER — POTASSIUM CHLORIDE 10 MEQ/50ML IV SOLN
10.0000 meq | INTRAVENOUS | Status: AC
  Administered 2014-10-04 – 2014-10-05 (×4): 10 meq via INTRAVENOUS
  Filled 2014-10-04: qty 50

## 2014-10-04 MED ORDER — POTASSIUM CHLORIDE 10 MEQ/50ML IV SOLN
10.0000 meq | INTRAVENOUS | Status: AC
  Administered 2014-10-04 (×2): 10 meq via INTRAVENOUS
  Filled 2014-10-04 (×2): qty 50

## 2014-10-04 MED ORDER — TRACE MINERALS CR-CU-F-FE-I-MN-MO-SE-ZN IV SOLN
INTRAVENOUS | Status: AC
  Administered 2014-10-04: 17:00:00 via INTRAVENOUS
  Filled 2014-10-04: qty 2000

## 2014-10-04 MED ORDER — MAGNESIUM SULFATE 50 % IJ SOLN
6.0000 g | Freq: Once | INTRAMUSCULAR | Status: AC
  Administered 2014-10-04: 6 g via INTRAVENOUS
  Filled 2014-10-04: qty 12

## 2014-10-04 MED ORDER — POTASSIUM CHLORIDE 20 MEQ/15ML (10%) PO SOLN: 20.0000 meq | ORAL | Status: DC

## 2014-10-04 NOTE — Progress Notes (Signed)
[place] for Infectious Disease     Day # 5 micafungin   Received 7 days of  vancomycin cefepime  Subjective: Intubated sedated   Antibiotics:  Anti-infectives    Start     Dose/Rate Route Frequency Ordered Stop   09/30/14 1300  micafungin (MYCAMINE) 100 mg in sodium chloride 0.9 % 100 mL IVPB    Comments:  Pharmacy may adjust as needed   100 mg100 mL/hr over 1 Hours Intravenous Every 24 hours 09/30/14 1213     09/27/14 2100  ceFEPIme (MAXIPIME) 2 g in dextrose 5 % 50 mL IVPB  Status:  Discontinued     2 g100 mL/hr over 30 Minutes Intravenous Every 24 hours 09/27/14 0603 09/27/14 1103   09/27/14 2000  vancomycin (VANCOCIN) IVPB 1000 mg/200 mL premix  Status:  Discontinued     1,000 mg200 mL/hr over 60 Minutes Intravenous Every 24 hours 09/27/14 0603 09/27/14 1103   09/27/14 1400  vancomycin (VANCOCIN) IVPB 1000 mg/200 mL premix  Status:  Discontinued     1,000 mg200 mL/hr over 60 Minutes Intravenous Every 12 hours 09/27/14 1103 10/02/14 1348   09/27/14 1200  ceFEPIme (MAXIPIME) 2 g in dextrose 5 % 50 mL IVPB  Status:  Discontinued     2 g100 mL/hr over 30 Minutes Intravenous Every 12 hours 09/27/14 1103 10/02/14 1348   09/26/14 1930  vancomycin (VANCOCIN) IVPB 1000 mg/200 mL premix     1,000 mg200 mL/hr over 60 Minutes Intravenous  Once 09/26/14 1917 09/26/14 2210   09/26/14 1930  ceFEPIme (MAXIPIME) 2 g in dextrose 5 % 50 mL IVPB     2 g100 mL/hr over 30 Minutes Intravenous  Once 09/26/14 1917 09/26/14 2132      Medications: Scheduled Meds: . antiseptic oral rinse  7 mL Mouth Rinse QID  . chlorhexidine  15 mL Mouth Rinse BID  . furosemide  40 mg Intravenous BID  . insulin aspart  0-9 Units Subcutaneous 6 times per day  . micafungin (MYCAMINE) IV  100 mg Intravenous Q24H  . pantoprazole (PROTONIX) IV  40 mg Intravenous Daily   Continuous Infusions: . Marland KitchenTPN (CLINIMIX-E) Adult 55 mL/hr at 10/03/14 1810  . Marland KitchenTPN (CLINIMIX-E) Adult    . fentaNYL infusion  INTRAVENOUS 150 mcg/hr (10/04/14 1200)  . heparin 2,200 Units/hr (10/04/14 1200)  . midazolam (VERSED) infusion 1 mg/hr (10/04/14 0800)   PRN Meds:.sodium chloride, acetaminophen (TYLENOL) oral liquid 160 mg/5 mL, acetaminophen, fentaNYL, fentaNYL, ipratropium-albuterol, labetalol, ondansetron (ZOFRAN) IV    Objective: Weight change: -7 lb 0.9 oz (-3.2 kg)  Intake/Output Summary (Last 24 hours) at 10/04/14 1255 Last data filed at 10/04/14 1200  Gross per 24 hour  Intake 3234.18 ml  Output   7830 ml  Net -4595.82 ml   Blood pressure 131/75, pulse 114, temperature 97.4 F (36.3 C), temperature source Oral, resp. rate 18, height 4\' 9"  (1.448 m), weight 165 lb 12.6 oz (75.2 kg), SpO2 98 %. Temp:  [97.4 F (36.3 C)-101.4 F (38.6 C)] 97.4 F (36.3 C) (12/27 1218) Pulse Rate:  [71-119] 114 (12/27 1217) Resp:  [16-36] 18 (12/27 1100) BP: (86-155)/(32-104) 131/75 mmHg (12/27 1217) SpO2:  [88 %-100 %] 98 % (12/27 1217) FiO2 (%):  [40 %] 40 % (12/27 1218) Weight:  [165 lb 12.6 oz (75.2 kg)] 165 lb 12.6 oz (75.2 kg) (12/27 0600)  Physical Exam: General: intubated sedated. HEENT:  EOMI CVS tachycardic normal r,  no murmur rubs or gallops Chest: +rhonchi Abdomen: soft distended, positive  bowel sounds dressing in place Skin: Ischemic changes in both hands as well as some bullae in the left hand see pictures below:  10/04/14 left hand    10/04/14 right hand     Neuro: nonfocal  CBC: CBC Latest Ref Rng 10/04/2014 10/03/2014 10/02/2014  WBC 4.0 - 10.5 K/uL 32.4(H) 23.6(H) 17.3(H)  Hemoglobin 12.0 - 15.0 g/dL 10.1(L) 9.7(L) 10.1(L)  Hematocrit 36.0 - 46.0 % 31.5(L) 29.8(L) 30.9(L)  Platelets 150 - 400 K/uL 588(H) 436(H) 272      BMET  Recent Labs  10/03/14 1645 10/04/14 0400  NA 140 141  K 3.5 3.6  CL 104 99  CO2 30 32  GLUCOSE 190* 118*  BUN <5* <5*  CREATININE 0.67 0.68  CALCIUM 7.8* 8.0*     Liver Panel  No results for input(s): PROT, ALBUMIN, AST, ALT,  ALKPHOS, BILITOT, BILIDIR, IBILI in the last 72 hours.     Sedimentation Rate No results for input(s): ESRSEDRATE in the last 72 hours. C-Reactive Protein No results for input(s): CRP in the last 72 hours.  Micro Results: Recent Results (from the past 720 hour(s))  Urine culture     Status: None   Collection Time: 09/26/14  7:22 PM  Result Value Ref Range Status   Specimen Description URINE, CATHETERIZED  Final   Special Requests NONE  Final   Culture  Setup Time   Final    09/28/2014 00:50 Performed at [place] Performed at [facility]   Final   Culture NO GROWTH Performed at [facility]   Final   Report Status 09/29/2014 FINAL  Final  Blood culture (routine x 2)     Status: None   Collection Time: 09/26/14  8:56 PM  Result Value Ref Range Status   Specimen Description BLOOD LEFT ANTECUBITAL DRAWN BY RN  Final   Special Requests BOTTLES DRAWN AEROBIC ONLY 4CC  Final   Culture  Setup Time   Final    09/30/2014 20:55 Performed at [facility]    Culture   Final    CANDIDA GLABRATA Note: Gram Stain Report Called to,Read Back By and Verified With: WILKINSON R([facility])@1140  ON 09/30/14 BY Elza Rafter. Performed at [facility] Performed at [facility]    Report Status 10/03/2014 FINAL  Final  Blood culture (routine x 2)     Status: None   Collection Time: 09/26/14  9:00 PM  Result Value Ref Range Status   Specimen Description BLOOD LEFT ANTECUBITAL DRAWN BY RN  Final   Special Requests BOTTLES DRAWN AEROBIC ONLY 4CC  Final   Culture   Final    CANDIDA GLABRATA Note: Gram Stain Report Called to,Read Back By and Verified With: DICKENS B AT [facility] AT 1610 ON [id number] BY FORSYTH K Performed at [facility]    Report Status 10/04/2014 FINAL  Final  MRSA PCR Screening     Status: None   Collection Time: 09/26/14 11:30 PM  Result Value Ref Range Status   MRSA by PCR NEGATIVE NEGATIVE Final     Comment:        The GeneXpert MRSA Assay (FDA approved for NASAL specimens only), is one component of a comprehensive MRSA colonization surveillance program. It is not intended to diagnose MRSA infection nor to guide or monitor treatment for MRSA infections.   Body fluid culture     Status: None   Collection Time: 09/27/14  1:59 AM  Result  Value Ref Range Status   Specimen Description FLUID PERITONEAL  Final   Special Requests ON SWAB  Final   Gram Stain   Final    RARE WBC PRESENT, PREDOMINANTLY PMN NO ORGANISMS SEEN Performed at [facility]    Culture   Final    FEW CANDIDA GLABRATA Note: CRITICAL RESULT CALLED TO, READ BACK BY AND VERIFIED WITH: BEVON BY INGRAMA 12/23 12N Performed at [facility]    Report Status 10/03/2014 FINAL  Final  Anaerobic culture     Status: None   Collection Time: 09/27/14  1:59 AM  Result Value Ref Range Status   Specimen Description FLUID PERITONEAL  Final   Special Requests NONE  Final   Gram Stain   Final    FEW WBC PRESENT, PREDOMINANTLY PMN NO SQUAMOUS EPITHELIAL CELLS SEEN NO ORGANISMS SEEN Performed at [facility]    Culture   Final    NO ANAEROBES ISOLATED Performed at [facility]    Report Status 10/02/2014 FINAL  Final  Culture, blood (routine x 2)     Status: None (Preliminary result)   Collection Time: 10/01/14  9:24 PM  Result Value Ref Range Status   Specimen Description BLOOD LEFT HAND  Final   Special Requests BOTTLES DRAWN AEROBIC ONLY 10CC  Final   Culture   Final           BLOOD CULTURE RECEIVED NO GROWTH TO DATE CULTURE WILL BE HELD FOR 5 DAYS BEFORE ISSUING A FINAL NEGATIVE REPORT Performed at [facility]    Report Status PENDING  Incomplete  Culture, blood (routine x 2)     Status: None (Preliminary result)   Collection Time: 10/01/14  9:35 PM  Result Value Ref Range Status   Specimen Description BLOOD RIGHT HAND  Final   Special Requests BOTTLES DRAWN AEROBIC  ONLY 10CC  Final   Culture   Final           BLOOD CULTURE RECEIVED NO GROWTH TO DATE CULTURE WILL BE HELD FOR 5 DAYS BEFORE ISSUING A FINAL NEGATIVE REPORT Performed at [facility]    Report Status PENDING  Incomplete    Studies/Results: Ct Abdomen Pelvis W Contrast  10/03/2014   CLINICAL DATA:  Six days postop from perforated gastric ulcer. Fever.  EXAM: CT ABDOMEN AND PELVIS WITH CONTRAST  TECHNIQUE: Multidetector CT imaging of the abdomen and pelvis was performed using the standard protocol following bolus administration of intravenous contrast.  CONTRAST:  160mL OMNIPAQUE IOHEXOL 300 MG/ML  SOLN  COMPARISON:  09/26/2014  FINDINGS: Patchy bibasilar airspace disease.  Small left pleural effusion.  NG tube tip in the body of the stomach  Surgical drain is in place within the lesser sac.  Free fluid is present in the left side of the abdomen and extending into the left pericolic gutter.  High-density material is present in the gallbladder.  Diffuse hepatic steatosis  Spleen, pancreas, adrenal glands, and kidneys are within normal limits  The abdominal wall has been sewn together. Overlying subcutaneous fat has been left open to heal by secondary intention.  Small amount of free fluid in the anterior pelvis.  Foley catheter decompresses the bladder.  There is free fluid layering in the pelvis. There is enhancement of the peritoneal lining surrounding the free-fluid in the pelvis. There are no gas bubbles. It does not appear loculated. The free-fluid in the pelvis measures 5.2 x 4.2 cm.  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  IMPRESSION: Bibasilar airspace disease.  Small left pleural effusion.  Postop changes with surgical drains in place.  There is scattered free fluid in the abdomen and pelvis. There is some enhancement of the peritoneal lining about the free-fluid in the pelvis. See above for details.   Electronically Signed   By: Maryclare Bean M.D.   On: 10/03/2014 13:47   Dg  Chest Port 1 View  10/04/2014   CLINICAL DATA:  Acute respiratory failure  EXAM: PORTABLE CHEST - 1 VIEW  COMPARISON:  10/03/2014  FINDINGS: Cardiomediastinal silhouette is stable. Endotracheal tube in place with tip 3.8 cm above the carina. Stable right IJ central line position. NG tube in place with tip in mid stomach. Slight worsening in aeration with airspace opacification left lung and right upper lobe. Findings may be due to bilateral multifocal pneumonia or asymmetric edema.  IMPRESSION: Stable support apparatus. Slight worsening in aeration. Again noted airspace opacity left lung and right upper lobe which may be due to multifocal pneumonia or asymmetric edema.   Electronically Signed   By: Lahoma Crocker M.D.   On: 10/04/2014 10:10   Dg Chest Port 1 View  10/03/2014   CLINICAL DATA:  Acute respiratory failure  EXAM: PORTABLE CHEST - 1 VIEW  COMPARISON:  Yesterday  FINDINGS: Tubular device is stable. Diffuse bilateral airspace disease improved. Low volumes. No pneumothorax. Normal heart size.  IMPRESSION: Improved bilateral airspace disease.   Electronically Signed   By: Maryclare Bean M.D.   On: 10/03/2014 08:20      Assessment/Plan:  Principal Problem:   Pneumoperitoneum Active Problems:   Sepsis   Altered mental state   Brachial artery occlusion   Acute respiratory failure with hypoxia   Severe sepsis   Perforated gastric ulcer   Acute respiratory failure   Artery occlusion   Blood poisoning   Candidemia   Screen for STD (sexually transmitted disease)   Abdominal abscess   Septic thrombophlebitis    ANAHY ESH is a 48 y.o. female with  ICU stay from pneumonia, history trach and PEG who presented with 1 day of abdominal pain and found pneumoperitoneum on CT and in OR had perforated prepyloric gastric ulcer. Also with clot in hand requiring thrombectomy in left brachial, radial and ulnar arteries. She has been on TPN and has central line. She has now FUNGEMIA  with Candida. She  has  also been given 7 days of f broad spectrum abx for HCAP and her perforated abdomen--I stopped these yestereday  #1 Candidemia: Glabrata which  IS NOT RELIABLY AZOLE sensitive--thus MICA needed   While source likely is the peritoneum given that she had perforation and she has yeast from abdominal abscess cultures from Operating room, she also has central line and is getting TPN which are set up for this type of infection and certainly do not make treating it any easier.  IDEALLY we would want to give her a LINE HOLIDAY with no central lines and NO TPN so that we could ensure clearance of her FUNGEMIA but that is very difficult at present given her multiple IV medicines. There also was an attempt at a PICC line placement today which failed a recent vascular events are also probably making things difficult.   Infectious disease Society of [place] guidelines typically also call for a formal dedicated ophthalmologic exam.  Given that she presented with FUNGEMIA and a thrombus should consider if this is fungal septic thrombophlebitis  I would therefore give her  one MONTH of IV MICAFUNGIN with start date being some day in the future when we can give her a "line holiday" and get repeat blood cultures with no line in place   #2 Perforated viscus:  I took her off antibacterial therapy yesterday.   CT scan shows presence of fluid with enhancing rim in abdomen still present  I WOULD RECOMMEND ASKING IR TO DRAIN THIS AND SEND MATERIAL FOR BACTERIAL AND FUNGAL CULTURES And I DISCUSSED WITH DR [facility] today from [facility]  #3 HCAP: She is SP  7 days of vancomycin and cefepime .   Given her rising WBC and fevers would also consider sending non bronch BAL vs tracheal aspirate from lungs for culture as well. Would hold on broadening abx until we have sample from the abdomen     LOS: 8 days   Alcide Evener 10/04/2014, 12:55 PM

## 2014-10-04 NOTE — Progress Notes (Signed)
7 Days Post-Op  Subjective: Remains intubated  Objective: Vital signs in last 24 hours: Temp:  [97.7 F (36.5 C)-100.9 F (38.3 C)] 100.3 F (37.9 C) (12/27 0900) Pulse Rate:  [71-119] 110 (12/27 1000) Resp:  [16-36] 19 (12/27 1000) BP: (86-155)/(32-104) 155/72 mmHg (12/27 1000) SpO2:  [88 %-100 %] 96 % (12/27 1000) FiO2 (%):  [40 %] 40 % (12/27 0800) Weight:  [165 lb 12.6 oz (75.2 kg)] 165 lb 12.6 oz (75.2 kg) (12/27 0600) Last BM Date: 09/25/14  Intake/Output from previous day: 12/26 0701 - 12/27 0700 In: 3775.5 [I.V.:1155.5; NG/GT:740; IV Piggyback:450; TPN:1430] Out: 7380 [Urine:7190; Emesis/NG output:100; Drains:90] Intake/Output this shift: Total I/O In: 460.3 [I.V.:245.3; IV Piggyback:50; TPN:165] Out: 2675 [Urine:2575; Emesis/NG output:100]  Abdomen wound stable Abdomen soft   Lab Results:   Recent Labs  10/03/14 0500 10/04/14 0400  WBC 23.6* 32.4*  HGB 9.7* 10.1*  HCT 29.8* 31.5*  PLT 436* 588*   BMET  Recent Labs  10/03/14 1645 10/04/14 0400  NA 140 141  K 3.5 3.6  CL 104 99  CO2 30 32  GLUCOSE 190* 118*  BUN <5* <5*  CREATININE 0.67 0.68  CALCIUM 7.8* 8.0*   PT/INR No results for input(s): LABPROT, INR in the last 72 hours. ABG  Recent Labs  10/02/14 0920  PHART 7.249*  HCO3 21.9    Studies/Results: Ct Abdomen Pelvis W Contrast  10/03/2014   CLINICAL DATA:  Six days postop from perforated gastric ulcer. Fever.  EXAM: CT ABDOMEN AND PELVIS WITH CONTRAST  TECHNIQUE: Multidetector CT imaging of the abdomen and pelvis was performed using the standard protocol following bolus administration of intravenous contrast.  CONTRAST:  146mL OMNIPAQUE IOHEXOL 300 MG/ML  SOLN  COMPARISON:  09/26/2014  FINDINGS: Patchy bibasilar airspace disease.  Small left pleural effusion.  NG tube tip in the body of the stomach  Surgical drain is in place within the lesser sac.  Free fluid is present in the left side of the abdomen and extending into the left  pericolic gutter.  High-density material is present in the gallbladder.  Diffuse hepatic steatosis  Spleen, pancreas, adrenal glands, and kidneys are within normal limits  The abdominal wall has been sewn together. Overlying subcutaneous fat has been left open to heal by secondary intention.  Small amount of free fluid in the anterior pelvis.  Foley catheter decompresses the bladder.  There is free fluid layering in the pelvis. There is enhancement of the peritoneal lining surrounding the free-fluid in the pelvis. There are no gas bubbles. It does not appear loculated. The free-fluid in the pelvis measures 5.2 x 4.2 cm.  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  IMPRESSION: Bibasilar airspace disease.  Small left pleural effusion.  Postop changes with surgical drains in place.  There is scattered free fluid in the abdomen and pelvis. There is some enhancement of the peritoneal lining about the free-fluid in the pelvis. See above for details.   Electronically Signed   By: Maryclare Bean M.D.   On: 10/03/2014 13:47   Dg Chest Port 1 View  10/04/2014   CLINICAL DATA:  Acute respiratory failure  EXAM: PORTABLE CHEST - 1 VIEW  COMPARISON:  10/03/2014  FINDINGS: Cardiomediastinal silhouette is stable. Endotracheal tube in place with tip 3.8 cm above the carina. Stable right IJ central line position. NG tube in place with tip in mid stomach. Slight worsening in aeration with airspace opacification left lung and right upper lobe. Findings may be due  to bilateral multifocal pneumonia or asymmetric edema.  IMPRESSION: Stable support apparatus. Slight worsening in aeration. Again noted airspace opacity left lung and right upper lobe which may be due to multifocal pneumonia or asymmetric edema.   Electronically Signed   By: Lahoma Crocker M.D.   On: 10/04/2014 10:10   Dg Chest Port 1 View  10/03/2014   CLINICAL DATA:  Acute respiratory failure  EXAM: PORTABLE CHEST - 1 VIEW  COMPARISON:  Yesterday  FINDINGS:  Tubular device is stable. Diffuse bilateral airspace disease improved. Low volumes. No pneumothorax. Normal heart size.  IMPRESSION: Improved bilateral airspace disease.   Electronically Signed   By: Maryclare Bean M.D.   On: 10/03/2014 08:20    Anti-infectives: Anti-infectives    Start     Dose/Rate Route Frequency Ordered Stop   09/30/14 1300  micafungin (MYCAMINE) 100 mg in sodium chloride 0.9 % 100 mL IVPB    Comments:  Pharmacy may adjust as needed   100 mg100 mL/hr over 1 Hours Intravenous Every 24 hours 09/30/14 1213     09/27/14 2100  ceFEPIme (MAXIPIME) 2 g in dextrose 5 % 50 mL IVPB  Status:  Discontinued     2 g100 mL/hr over 30 Minutes Intravenous Every 24 hours 09/27/14 0603 09/27/14 1103   09/27/14 2000  vancomycin (VANCOCIN) IVPB 1000 mg/200 mL premix  Status:  Discontinued     1,000 mg200 mL/hr over 60 Minutes Intravenous Every 24 hours 09/27/14 0603 09/27/14 1103   09/27/14 1400  vancomycin (VANCOCIN) IVPB 1000 mg/200 mL premix  Status:  Discontinued     1,000 mg200 mL/hr over 60 Minutes Intravenous Every 12 hours 09/27/14 1103 10/02/14 1348   09/27/14 1200  ceFEPIme (MAXIPIME) 2 g in dextrose 5 % 50 mL IVPB  Status:  Discontinued     2 g100 mL/hr over 30 Minutes Intravenous Every 12 hours 09/27/14 1103 10/02/14 1348   09/26/14 1930  vancomycin (VANCOCIN) IVPB 1000 mg/200 mL premix     1,000 mg200 mL/hr over 60 Minutes Intravenous  Once 09/26/14 1917 09/26/14 2210   09/26/14 1930  ceFEPIme (MAXIPIME) 2 g in dextrose 5 % 50 mL IVPB     2 g100 mL/hr over 30 Minutes Intravenous  Once 09/26/14 1917 09/26/14 2132      Assessment/Plan: s/p Procedure(s): Left Radial, Brachial, and Ulnar Thrombectomy; Left Brachial to Radial Bypass Graft using Greater Saphenous vein graft from Left Thigh; Left Saphenous Vein Harvest; Intraoperative Arteriogram; Intra-arterial administration of TPA (Left)  S/p perforated gastric ulces  The fluid in the pelvis looks benign I suspect is post op fluid.   ID would like it sampled.  Will consult IR to see if they can aspirate some Will start low dose tube feeds.  LOS: 8 days    Sheala Dosh A 10/04/2014

## 2014-10-04 NOTE — Progress Notes (Addendum)
   [facility]  Subjective  - Intubated sedated    Objective 139/67 104 99.7 F (37.6 C) (Oral) 19 94%  Intake/Output Summary (Last 24 hours) at 10/04/14 0855 Last data filed at 10/04/14 0800  Gross per 24 hour  Intake 3758.13 ml  Output   8080 ml  Net -4321.87 ml    Palpable radial, DP bilaterally Incisions bil LE healing well right arm and leg Lungs intubated Heart tachycardia  Fingers bilateral hands improving ischemic changes, no sign of infection   Assessment/Planning: POD #Left Radial, Brachial, and Ulnar Thrombectomy; Left Brachial to Radial Bypass Graft using Greater Saphenous vein graft from Left Thigh; Left Saphenous Vein Harvest; Intraoperative Arteriogram; Intra-arterial administration of TPA (Left) followed by redo. Heparin IV  Lungs intubated Plan ECHO on Monday prior to extubating   [facility] 10/04/2014 8:55 AM --  Laboratory Lab Results:  Recent Labs  10/03/14 0500 10/04/14 0400  WBC 23.6* 32.4*  HGB 9.7* 10.1*  HCT 29.8* 31.5*  PLT 436* 588*   BMET  Recent Labs  10/03/14 1645 10/04/14 0400  NA 140 141  K 3.5 3.6  CL 104 99  CO2 30 32  GLUCOSE 190* 118*  BUN <5* <5*  CREATININE 0.67 0.68  CALCIUM 7.8* 8.0*    COAG No results found for: INR, PROTIME No results found for: PTT agree with above assessment Continues to have 3+ pulse in left brachial-radial bypass and 3+ right radial pulse. Blistering left hand fourth and fifth digits and palmar skin at base of fourth and fifth digits is stable area distal ischemic changes several fingers both hands.  Continuing to be diuresed on the ventilator with hopes of extubation in the next 24-48 hours Remains on heparin at 2200 units per hour Nothing further to add from vascular standpoint

## 2014-10-04 NOTE — Progress Notes (Signed)
    [facility] has been requested to perform a transesophageal echocardiogram on 10/05/14 for thrombus.  After careful review of history and examination, the risks and benefits of transesophageal echocardiogram have been explained including risks of esophageal damage, perforation (1:10,000 risk), bleeding, pharyngeal hematoma as well as other potential complications associated with conscious sedation including aspiration, arrhythmia, respiratory failure and death. Alternatives to treatment were discussed, questions were answered. Patient's POA [name] is willing to proceed. MD will review pt's chart as well.  [id number] R, NP 10/04/2014 4:27 PM

## 2014-10-04 NOTE — Progress Notes (Addendum)
PARENTERAL NUTRITION CONSULT NOTE - FOLLOW UP  Pharmacy Consult for TPN Indication: S/P exp lap for perforated gastric ulcer  Allergies  Allergen Reactions  . Ambien [Zolpidem Tartrate] Other (See Comments)    Crazy dreams  . Ceftin [Cefuroxime Axetil] Nausea And Vomiting  . Hctz [Hydrochlorothiazide] Other (See Comments)    Urinary retention  . Hydrocodone Nausea Only  . Lodine [Etodolac] Hives  . Promethazine Other (See Comments)    Hallucinations.   . Roxicodone [Oxycodone Hcl] Swelling  . Codeine Rash  . Latex Rash  . Penicillins Rash    Patient Measurements: Height: 4\' 9"  (144.8 cm) Weight: 165 lb 12.6 oz (75.2 kg) IBW/kg (Calculated) : 38.6 Adjusted Body Weight:  Usual Weight:   Vital Signs: Temp: 99.7 F (37.6 C) (12/27 0400) Temp Source: Oral (12/27 0400) BP: 139/67 mmHg (12/27 0748) Pulse Rate: 104 (12/27 0748) Intake/Output from previous day: 12/26 0701 - 12/27 0700 In: 3775.5 [I.V.:1155.5; NG/GT:740; IV Piggyback:450; TPN:1430] Out: 7380 [Urine:7190; Emesis/NG output:100; Drains:90] Intake/Output from this shift: Total I/O In: 190.9 [I.V.:85.9; IV Piggyback:50; TPN:55] Out: 875 [Urine:775; Emesis/NG output:100]  Labs:  Recent Labs  10/02/14 0340 10/03/14 0500 10/04/14 0400  WBC 17.3* 23.6* 32.4*  HGB 10.1* 9.7* 10.1*  HCT 30.9* 29.8* 31.5*  PLT 272 436* 588*     Recent Labs  10/02/14 0340 10/03/14 0500 10/03/14 0512 10/03/14 1645 10/04/14 0400  NA 140 140  --  140 141  K 2.9* 2.9*  --  3.5 3.6  CL 111 108  --  104 99  CO2 23 27  --  30 32  GLUCOSE 149* 138*  --  190* 118*  BUN <5* <5*  --  <5* <5*  CREATININE 0.65 0.72  --  0.67 0.68  CALCIUM 7.5* 7.7*  --  7.8* 8.0*  MG 1.8 1.8  --   --  1.5  PHOS  --  1.7*  --   --  3.2  TRIG  --   --  174*  --   --    Estimated Creatinine Clearance: 71.4 mL/min (by C-G formula based on Cr of 0.68).    Recent Labs  10/03/14 1644 10/03/14 1926 10/03/14 2356  GLUCAP 174* 168* 119*     Medications:  Scheduled:  . antiseptic oral rinse  7 mL Mouth Rinse QID  . chlorhexidine  15 mL Mouth Rinse BID  . furosemide  40 mg Intravenous BID  . insulin aspart  0-9 Units Subcutaneous 6 times per day  . magnesium sulfate LVP 250-500 ml  6 g Intravenous Once  . micafungin (MYCAMINE) IV  100 mg Intravenous Q24H  . pantoprazole (PROTONIX) IV  40 mg Intravenous Daily  . potassium chloride  10 mEq Intravenous Q1 Hr x 2    Insulin Requirements: 5 units Novolog SSI/24hr, no insulin in TPN  Nutritional Requirements: [id number] Kcal with 60-65g protein per RD recommendations (22-25 kcals/kg ideal body weight) based on ASPEN guidelines for hypocaloric, high protein feeding in critically ill obese individuals  Current Nutrition: Clinimix-E 5/15 at 108ml/hr provides 1056 kcal and 60g protein  Admit:  Admitted 12/20 with abd pain with vomiting/fevers. (+)pneumoperitoneum 2nd SB rupture.  GI: S/P exp lap/oversew of perforated pre-pyloric gastric ulcer; omental patch 12/20. (+)Hx GERD. NGT with 155mL out and JP drain with 69mL out yesterday.Plan for UGI on at least POD#5 (today is POD#5) prior to feeding. 12/26 CT(+)pelvic fluid. PPI IV. Prealbumin 3.6 at baseline (low but noted recent surgery). Abd soft, distended.  Endo: Borderline  DM.CBGs 119-174 - one episode of hypoglycemia 12/24 0900  Lytes: K 3.6 & Phos 3.2 (s/p K runs x 6 & K-Phos 84mmol on 12/26), Corr Ca 9.6  Renal: Cr stable, UOP 4 ml/kg/hr. I/O (-)3.6L yesterday  Pulm: Hx COPD, chronic bronchitis. Acute resp failure, intubated - fio2 40%.   Cards:BP variable. HR 90s.  Labetalol prn  AC: S/P ulnar artery embolectomy 12/20, on Heparin IV.  Hepatobil: AST mildly elevated; other LFTs wnl. TG pending for the a.m.  Neuro: Hx MS, depression, periph neuropathy.Nicotine patch, Fent and Versed infusions. RASS -2 (goal -2).  ID: Micafungin (12/23 >> ) for yeast in blood and peritoneal fluid. Tm 100.9, WBC  32.4- cont trending up.  Considering change of line.  ID recs:  line holiday, No TPN to clear fungemia, & LOT 30day to start in future when able to give line holiday.  Best Practices: MC, SCDs TPN Access: R-IJ CVL TPN day#: 12/23 >>  Plan: -Contacted Surgery regarding ID recommendations; they will evaluate when able to see her later today. -For now, continue Clinimix to E 5/15 at 7ml/hr and 20% lipid emulsion on Mon, Wed, Fri to provide 1143 kcal and 66g protein. This will meet 100% of patients estimated needs. -Add 12 units insulin to bag, pt will receive ~8 units -TPN labs AM   Gracy Bruins, PharmD Clinical Pharmacist [facility]

## 2014-10-04 NOTE — Progress Notes (Signed)
ANTICOAGULATION CONSULT NOTE - Follow Up Consult  Pharmacy Consult for heparin Indication: arterial clot s/p thrombectomy  Allergies  Allergen Reactions  . Ambien [Zolpidem Tartrate] Other (See Comments)    Crazy dreams  . Ceftin [Cefuroxime Axetil] Nausea And Vomiting  . Hctz [Hydrochlorothiazide] Other (See Comments)    Urinary retention  . Hydrocodone Nausea Only  . Lodine [Etodolac] Hives  . Promethazine Other (See Comments)    Hallucinations.   . Roxicodone [Oxycodone Hcl] Swelling  . Codeine Rash  . Latex Rash  . Penicillins Rash    Patient Measurements: Height: 4\' 9"  (144.8 cm) Weight: 165 lb 12.6 oz (75.2 kg) IBW/kg (Calculated) : 38.6 Heparin Dosing Weight: 70 kg  Vital Signs: Temp: 99.7 F (37.6 C) (12/27 0400) Temp Source: Oral (12/27 0400) BP: 114/48 mmHg (12/27 0700) Pulse Rate: 96 (12/27 0700)  Labs:  Recent Labs  10/02/14 0340  10/03/14 0500 10/03/14 1400 10/03/14 1645 10/03/14 2020 10/04/14 0400  HGB 10.1*  --  9.7*  --   --   --  10.1*  HCT 30.9*  --  29.8*  --   --   --  31.5*  PLT 272  --  436*  --   --   --  588*  HEPARINUNFRC  --   < > 0.27* 0.46  --  0.48 0.24*  CREATININE 0.65  --  0.72  --  0.67  --  0.68  < > = values in this interval not displayed.  Estimated Creatinine Clearance: 71.4 mL/min (by C-G formula based on Cr of 0.68).   Medications:  Infusions:  . Marland KitchenTPN (CLINIMIX-E) Adult 55 mL/hr at 10/03/14 1810  . fentaNYL infusion INTRAVENOUS 200 mcg/hr (10/03/14 2000)  . heparin 2,050 Units/hr (10/03/14 2300)  . midazolam (VERSED) infusion 4 mg/hr (10/04/14 0700)    Assessment: 53 yoF with abdominal pain and N/V found to have a large perforated ulcer. 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 Of note, she underwent a thrombectomy on 12/20 and received intra-arterial TPA.  Heparin level is again SUBtherapeutic at 0.24 after two therapeutic levels. Per nurse, no interruptions in gtt and no  bleeding or further oozing from central site noted. H/H remains low but relatively stable, plt trend up.   Goal of Therapy:  Heparin level 0.3-0.5 units/ml Monitor platelets by anticoagulation protocol: Yes   Plan:  - Increase heparin drip to 2200 units/hr - 6 hr heparin level - Monitor for s/sx of bleeding - Consider switch to lovenox since requiring such a large amount of heparin   Finis Hendricksen K. Velva Harman, PharmD Clinical Pharmacist - Resident Pager: [phone] Pharmacy: [phone] 10/04/2014 7:34 AM

## 2014-10-04 NOTE — Progress Notes (Signed)
PULMONARY / CRITICAL CARE MEDICINE HISTORY AND PHYSICAL EXAMINATION   Name: [name] MRN: [id number] DOB: [birth year]    ADMISSION DATE:  09/26/2014  PRIMARY SERVICE: PCCM  CHIEF COMPLAINT:  Abd pain  BRIEF PATIENT DESCRIPTION: 1yof smoker with PMH of MS, depression presents with abd pain, found to have pneumoperitoneum 2/2 small bowel rupture, also with new cold L hand.  Required treatment for DU e  SIGNIFICANT EVENTS / STUDIES:  09/26/14: Admitted, CT Abd with pneumoperitoneum, cold L hand concerning for arterial clot as well. 12/20 > ex-lap [name]) with omental patch for perforated prepyloric gastric ulcer; L brachial and ulnar artery embolectomy [name]) 12/20 LE doppler >>neg 12/20 >>Left brachial to left radial artery bypass with left leg greater saphenous vein for Recurrent occlusion  12/20 Self extubated - had to be reintubated 12/21 echo >>nml LVEF 12/23 TEE ordered 12/24 TEE not yet completed 12/26 CT abdomen >> pelvic fluid  LINES / TUBES: R IJ CVL 12/20 >>  JP drain RUQ 12/20 >>  ETT 12/19 >>    SUBJECTIVE: Febrile, low grade Calmer on WUA this am.  Good Uo with lasix  VITAL SIGNS: Temp:  [97.7 F (36.5 C)-100.9 F (38.3 C)] 99.7 F (37.6 C) (12/27 0400) Pulse Rate:  [71-119] 96 (12/27 0700) Resp:  [16-36] 16 (12/27 0700) BP: (86-179)/(32-104) 114/48 mmHg (12/27 0700) SpO2:  [91 %-100 %] 95 % (12/27 0700) FiO2 (%):  [40 %] 40 % (12/27 0413) Weight:  [75.2 kg (165 lb 12.6 oz)] 75.2 kg (165 lb 12.6 oz) (12/27 0600) HEMODYNAMICS:    Vent Mode:  [-] PRVC FiO2 (%):  [40 %] 40 % Set Rate:  [16 bmp] 16 bmp Vt Set:  [450 mL] 450 mL PEEP:  [5 cmH20] 5 cmH20 Plateau Pressure:  [19 cmH20-22 cmH20] 20 cmH20 INTAKE / OUTPUT: Intake/Output      12/26 0701 - 12/27 0700 12/27 0701 - 12/28 0700   I.V. (mL/kg) 1155.5 (15.4)    NG/GT 740    IV Piggyback 450    TPN 1430    Total Intake(mL/kg) 3775.5 (50.2)    Urine (mL/kg/hr) 7190 (4)    Emesis/NG  output 100 (0.1)    Drains 90 (0)    Total Output 7380     Net -3604.6            PHYSICAL EXAMINATION:  Gen: RASS +1 to -2, not F/C HEENT: WNL PULM: Clear anteriorly CV: Reg, no  AB: absent BS, surgical dressing Ext: warm, no edema Neuro: no focal deficits  LABS: I have reviewed all of today's lab results. Relevant abnormalities are discussed in the A/P section  CXR: edema pattern, improving  ASSESSMENT / PLAN:  Principal Problem:   Pneumoperitoneum Active Problems:   Sepsis   Altered mental state   Brachial artery occlusion   Acute respiratory failure with hypoxia   Severe sepsis   Perforated gastric ulcer   Acute respiratory failure   Artery occlusion   Blood poisoning   Candidemia   Screen for STD (sexually transmitted disease)   Abdominal abscess   Septic thrombophlebitis   PULMONARY A: ETT 12/19 >>  Acute respiratory failure Pulm infiltrates P:   Cont full vent support - settings reviewed and/or adjusted Cont vent bundle Daily SBT if/when meets criteria -OK to wean on low dose sedation diurese  CARDIOVASCULAR A: Sinus tachycardia, reactive Hypertension, controlled Multiple thromboemboli P:   Cont labetalol prn Cont heparin gtt TEE pending  RENAL A: AKI, resolved Hypokalemia/hypophos P:  Monitor BMET intermittently Monitor I/Os Correct electrolytes as indicated  GASTROINTESTINAL A: Perforated gastric ulcer and pneumoperitoneum  Post laparotomy P:   Post op care per surgery Cont TPN Cont SUP  HEMATOLOGIC A: Very mild thrombocytopenia P:   DVT px: full heparin Monitor CBC intermittently Transfuse per usual ICU guidelines  INFECTIOUS A: Peritonitis after bowel perforation Severe sepsis Fungemia -unclear source Persistent fever, rising WC P:   Blood cx: 09/26/14-->  NGTD 12/20 peritoneal fluid >> few yeast  Vanc: 12/19 >> 12/25 Zosyn:  12/19 >> 12/25 Micafungin 12/23 >>   ID following ,consider changing lines if  fever persists -doubt line sepsis since cx on admit growing yeast Pelvic fluid ? drain  ENDOCRINE A:  Hypoglycemia, resolved Intermittent hyperglycemia P:   Cont SSI  NEUROLOGIC A: Post op pain Acute encephalopathy/ delerium P:   RASS goal 0 Cont PAD protocol , minimise versed gtt Holding home SSRI and benzo   Social / Family:   TODAY'S SUMMARY: Need diuresis & better control of delerium for extubation, TEE pending   The patient is critically ill with multiple organ systems failure and requires high complexity decision making for assessment and support, frequent evaluation and titration of therapies, application of advanced monitoring technologies and extensive interpretation of multiple databases. Critical Care Time devoted to patient care services described in this note independent of APP time is 35 minutes.    [name] MD  10/04/2014, 7:31 AM

## 2014-10-04 NOTE — Progress Notes (Addendum)
ANTICOAGULATION CONSULT NOTE - Follow Up Consult  Pharmacy Consult for heparin Indication: arterial clot s/p thrombectomy  Allergies  Allergen Reactions  . Ambien [Zolpidem Tartrate] Other (See Comments)    Crazy dreams  . Ceftin [Cefuroxime Axetil] Nausea And Vomiting  . Hctz [Hydrochlorothiazide] Other (See Comments)    Urinary retention  . Hydrocodone Nausea Only  . Lodine [Etodolac] Hives  . Promethazine Other (See Comments)    Hallucinations.   . Roxicodone [Oxycodone Hcl] Swelling  . Codeine Rash  . Latex Rash  . Penicillins Rash    Patient Measurements: Height: 4\' 9"  (144.8 cm) Weight: 165 lb 12.6 oz (75.2 kg) IBW/kg (Calculated) : 38.6 Heparin Dosing Weight: 70 kg  Vital Signs: Temp: 97.4 F (36.3 C) (12/27 1218) Temp Source: Oral (12/27 1218) BP: 136/70 mmHg (12/27 1300) Pulse Rate: 117 (12/27 1300)  Labs:  Recent Labs  10/02/14 0340  10/03/14 0500  10/03/14 1645 10/03/14 2020 10/04/14 0400 10/04/14 1330  HGB 10.1*  --  9.7*  --   --   --  10.1*  --   HCT 30.9*  --  29.8*  --   --   --  31.5*  --   PLT 272  --  436*  --   --   --  588*  --   HEPARINUNFRC  --   < > 0.27*  < >  --  0.48 0.24* 0.50  CREATININE 0.65  --  0.72  --  0.67  --  0.68  --   < > = values in this interval not displayed.  Estimated Creatinine Clearance: 71.4 mL/min (by C-G formula based on Cr of 0.68).   Medications:  Infusions:  . Marland KitchenTPN (CLINIMIX-E) Adult 55 mL/hr at 10/03/14 1810  . Marland KitchenTPN (CLINIMIX-E) Adult    . fentaNYL infusion INTRAVENOUS 150 mcg/hr (10/04/14 1300)  . heparin 2,200 Units/hr (10/04/14 1300)  . midazolam (VERSED) infusion 1 mg/hr (10/04/14 0800)    Assessment: 60 yoF with abdominal pain and N/V found to have a large perforated ulcer. 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 Of note, she underwent a thrombectomy on 12/20 and received intra-arterial TPA.  Heparin level is now therapeutic at 0.50 after rate increase this  am. H/H remains low but relatively stable, plt trend up.   Goal of Therapy:  Heparin level 0.3-0.5 units/ml Monitor platelets by anticoagulation protocol: Yes   Plan:  - Continue heparin drip at 2200 units/hr - 6 hr confirmatory heparin level - Monitor for s/sx of bleeding - Consider switch to lovenox since requiring such a large amount of heparin   Erika K. Velva Harman, PharmD Clinical Pharmacist - Resident Pager: [phone] Pharmacy: [phone] 10/04/2014 2:17 PM   -------------------------------------------------------------------------------------------------------- Addendum:  Heparin level this evening resulted as slightly SUPRAtherapeutic of lower heparin goal range (HL 0.59 << 0.5, goal of 0.3-0.5). Will reduce rate and f/u with HL in the AM.  Plan 1. Reduce heparin to 2100 units/hr (21 ml/hr) 2. Will continue to monitor for any signs/symptoms of bleeding and will follow up with heparin level in 6 hours   Alycia Rossetti, PharmD, BCPS Clinical Pharmacist Pager: [phone] 10/04/2014 9:03 PM

## 2014-10-04 NOTE — Progress Notes (Signed)
[facility] Progress Note Patient Name: [name] DOB: 08-Mar-1966 MRN: [id number]   Date of Service  10/04/2014  HPI/Events of Note  Replaced potassium IV  eICU Interventions       Intervention Category Intermediate Interventions: Electrolyte abnormality - evaluation and management  Trindon Dorton S. 10/04/2014, 11:18 PM

## 2014-10-04 NOTE — Progress Notes (Signed)
[facility] ADULT ICU REPLACEMENT PROTOCOL FOR AM LAB REPLACEMENT ONLY  The patient does apply for the [facility] Adult ICU Electrolyte Replacment Protocol based on the criteria listed below:   1. Is GFR >/= 40 ml/min? Yes.    Patient's GFR today is >90 2. Is urine output >/= 0.5 ml/kg/hr for the last 6 hours? Yes.   Patient's UOP is 0.68 ml/kg/hr 3. Is BUN < 60 mg/dL? Yes.    Patient's BUN today is <5 4. Abnormal electrolyte(s): Potassium 3.6, Magnesium 1.5 5. Ordered repletion with: Elink adult ICU replacement protocol 6. If a panic level lab has been reported, has the CCM MD in charge been notified? Yes.  .   Physician:  Dr. Benjamine Mola Deterding  [facility], Darrick Huntsman E 10/04/2014 6:11 AM

## 2014-10-05 ENCOUNTER — Inpatient Hospital Stay (HOSPITAL_COMMUNITY): Payer: BLUE CROSS/BLUE SHIELD

## 2014-10-05 DIAGNOSIS — J189 Pneumonia, unspecified organism: Secondary | ICD-10-CM | POA: Insufficient documentation

## 2014-10-05 DIAGNOSIS — I998 Other disorder of circulatory system: Secondary | ICD-10-CM | POA: Insufficient documentation

## 2014-10-05 DIAGNOSIS — K668 Other specified disorders of peritoneum: Secondary | ICD-10-CM

## 2014-10-05 DIAGNOSIS — I340 Nonrheumatic mitral (valve) insufficiency: Secondary | ICD-10-CM

## 2014-10-05 LAB — GLUCOSE, CAPILLARY
GLUCOSE-CAPILLARY: 125 mg/dL — AB (ref 70–99)
Glucose-Capillary: 112 mg/dL — ABNORMAL HIGH (ref 70–99)
Glucose-Capillary: 51 mg/dL — ABNORMAL LOW (ref 70–99)
Glucose-Capillary: 122 mg/dL — ABNORMAL HIGH (ref 70–99)
Glucose-Capillary: 134 mg/dL — ABNORMAL HIGH (ref 70–99)
Glucose-Capillary: 112 mg/dL — ABNORMAL HIGH (ref 70–99)
Glucose-Capillary: 155 mg/dL — ABNORMAL HIGH (ref 70–99)

## 2014-10-05 LAB — BASIC METABOLIC PANEL
Anion gap: 6 (ref 5–15)
Anion gap: 8 (ref 5–15)
BUN: 7 mg/dL (ref 6–23)
BUN: 7 mg/dL (ref 6–23)
CHLORIDE: 96 meq/L (ref 96–112)
CHLORIDE: 94 meq/L — AB (ref 96–112)
CO2: 37 mmol/L — AB (ref 19–32)
CO2: 37 mmol/L — AB (ref 19–32)
Calcium: 7.6 mg/dL — ABNORMAL LOW (ref 8.4–10.5)
Calcium: 8 mg/dL — ABNORMAL LOW (ref 8.4–10.5)
Creatinine, Ser: 0.71 mg/dL (ref 0.50–1.10)
Creatinine, Ser: 0.7 mg/dL (ref 0.50–1.10)
GFR calc Af Amer: >90 mL/min (ref >90)
GFR calc Af Amer: >90 mL/min (ref >90)
GFR calc non Af Amer: >90 mL/min (ref >90)
GFR calc non Af Amer: >90 mL/min (ref >90)
GLUCOSE: 115 mg/dL — AB (ref 70–99)
Glucose, Bld: 186 mg/dL — ABNORMAL HIGH (ref 70–99)
POTASSIUM: 4.1 mmol/L (ref 3.5–5.1)
POTASSIUM: 3.2 mmol/L — AB (ref 3.5–5.1)
Sodium: 139 mmol/L (ref 135–145)
Sodium: 139 mmol/L (ref 135–145)

## 2014-10-05 LAB — COMPREHENSIVE METABOLIC PANEL
ALT: 31 U/L (ref 0–35)
AST: 45 U/L — AB (ref 0–37)
Albumin: 1.5 g/dL — ABNORMAL LOW (ref 3.5–5.2)
Alkaline Phosphatase: 102 U/L (ref 39–117)
Anion gap: 7 (ref 5–15)
BILIRUBIN TOTAL: 1.4 mg/dL — AB (ref 0.3–1.2)
BUN: 6 mg/dL (ref 6–23)
CALCIUM: 7.6 mg/dL — AB (ref 8.4–10.5)
CHLORIDE: 94 meq/L — AB (ref 96–112)
CO2: 37 mmol/L — AB (ref 19–32)
Creatinine, Ser: 0.67 mg/dL (ref 0.50–1.10)
GFR calc Af Amer: >90 mL/min (ref >90)
GFR calc non Af Amer: >90 mL/min (ref >90)
Glucose, Bld: 115 mg/dL — ABNORMAL HIGH (ref 70–99)
Potassium: 3.8 mmol/L (ref 3.5–5.1)
SODIUM: 138 mmol/L (ref 135–145)
Total Protein: 5.8 g/dL — ABNORMAL LOW (ref 6.0–8.3)

## 2014-10-05 LAB — DIFFERENTIAL
Basophils Absolute: 0.3 10*3/uL — ABNORMAL HIGH (ref 0.0–0.1)
Basophils Relative: 1 % (ref 0–1)
Eosinophils Absolute: 0.8 10*3/uL — ABNORMAL HIGH (ref 0.0–0.7)
Eosinophils Relative: 3 % (ref 0–5)
Lymphocytes Relative: 17 % (ref 12–46)
Lymphs Abs: 4.6 10*3/uL — ABNORMAL HIGH (ref 0.7–4.0)
MONOS PCT: 6 % (ref 3–12)
Monocytes Absolute: 1.6 10*3/uL — ABNORMAL HIGH (ref 0.1–1.0)
Neutro Abs: 19.9 10*3/uL — ABNORMAL HIGH (ref 1.7–7.7)
Neutrophils Relative %: 73 % (ref 43–77)

## 2014-10-05 LAB — PROTIME-INR
INR: 1.25 (ref 0.00–1.49)
Prothrombin Time: 15.8 seconds — ABNORMAL HIGH (ref 11.6–15.2)

## 2014-10-05 LAB — TRIGLYCERIDES: Triglycerides: 222 mg/dL — ABNORMAL HIGH (ref <150)

## 2014-10-05 LAB — HEPARIN LEVEL (UNFRACTIONATED): HEPARIN UNFRACTIONATED: 0.55 [IU]/mL (ref 0.30–0.70)

## 2014-10-05 LAB — CBC
HCT: 30.6 % — ABNORMAL LOW (ref 36.0–46.0)
Hemoglobin: 9.5 g/dL — ABNORMAL LOW (ref 12.0–15.0)
MCH: 30 pg (ref 26.0–34.0)
MCHC: 31 g/dL (ref 30.0–36.0)
MCV: 96.5 fL (ref 78.0–100.0)
PLATELETS: 694 10*3/uL — AB (ref 150–400)
RBC: 3.17 MIL/uL — AB (ref 3.87–5.11)
RDW: 13.9 % (ref 11.5–15.5)
WBC: 27.2 10*3/uL — ABNORMAL HIGH (ref 4.0–10.5)

## 2014-10-05 LAB — MAGNESIUM: Magnesium: 2.2 mg/dL (ref 1.5–2.5)

## 2014-10-05 LAB — PREALBUMIN: Prealbumin: 5.8 mg/dL — ABNORMAL LOW (ref 17.0–34.0)

## 2014-10-05 LAB — PHOSPHORUS: PHOSPHORUS: 3.5 mg/dL (ref 2.3–4.6)

## 2014-10-05 MED ORDER — FAT EMULSION 20 % IV EMUL
240.0000 mL | INTRAVENOUS | Status: AC
Start: 2014-10-05 — End: 2014-10-06
  Administered 2014-10-05: 240 mL via INTRAVENOUS
  Filled 2014-10-05: qty 250

## 2014-10-05 MED ORDER — CLINIMIX E/DEXTROSE (5/15) 5 % IV SOLN
INTRAVENOUS | Status: AC
  Administered 2014-10-05: 17:00:00 via INTRAVENOUS
  Filled 2014-10-05: qty 2000

## 2014-10-05 MED ORDER — SODIUM CHLORIDE 0.9 % IV SOLN
INTRAVENOUS | Status: DC
  Administered 2014-10-05 – 2014-10-08 (×2): 20 mL via INTRAVENOUS
  Administered 2014-10-09 – 2014-10-12 (×2): via INTRAVENOUS

## 2014-10-05 MED ORDER — POTASSIUM CHLORIDE 10 MEQ/50ML IV SOLN
10.0000 meq | INTRAVENOUS | Status: AC
  Administered 2014-10-05 (×4): 10 meq via INTRAVENOUS
  Filled 2014-10-05: qty 50

## 2014-10-05 MED ORDER — VITAL HIGH PROTEIN PO LIQD
1000.0000 mL | ORAL | Status: DC
  Administered 2014-10-05: 1000 mL
  Filled 2014-10-05 (×2): qty 1000

## 2014-10-05 MED ORDER — SODIUM CHLORIDE 0.9 % IV SOLN
0.0000 mg/h | INTRAVENOUS | Status: DC
  Administered 2014-10-05: 2 mg/h via INTRAVENOUS
  Filled 2014-10-05 (×2): qty 10

## 2014-10-05 NOTE — Progress Notes (Signed)
[facility] for Infectious Disease     Day # 6 micafungin   Received 7 days of  vancomycin cefepime  Subjective: Intubated sedated   Antibiotics:  Anti-infectives    Start     Dose/Rate Route Frequency Ordered Stop   09/30/14 1300  micafungin (MYCAMINE) 100 mg in sodium chloride 0.9 % 100 mL IVPB    Comments:  Pharmacy may adjust as needed   100 mg100 mL/hr over 1 Hours Intravenous Every 24 hours 09/30/14 1213     09/27/14 2100  ceFEPIme (MAXIPIME) 2 g in dextrose 5 % 50 mL IVPB  Status:  Discontinued     2 g100 mL/hr over 30 Minutes Intravenous Every 24 hours 09/27/14 0603 09/27/14 1103   09/27/14 2000  vancomycin (VANCOCIN) IVPB 1000 mg/200 mL premix  Status:  Discontinued     1,000 mg200 mL/hr over 60 Minutes Intravenous Every 24 hours 09/27/14 0603 09/27/14 1103   09/27/14 1400  vancomycin (VANCOCIN) IVPB 1000 mg/200 mL premix  Status:  Discontinued     1,000 mg200 mL/hr over 60 Minutes Intravenous Every 12 hours 09/27/14 1103 10/02/14 1348   09/27/14 1200  ceFEPIme (MAXIPIME) 2 g in dextrose 5 % 50 mL IVPB  Status:  Discontinued     2 g100 mL/hr over 30 Minutes Intravenous Every 12 hours 09/27/14 1103 10/02/14 1348   09/26/14 1930  vancomycin (VANCOCIN) IVPB 1000 mg/200 mL premix     1,000 mg200 mL/hr over 60 Minutes Intravenous  Once 09/26/14 1917 09/26/14 2210   09/26/14 1930  ceFEPIme (MAXIPIME) 2 g in dextrose 5 % 50 mL IVPB     2 g100 mL/hr over 30 Minutes Intravenous  Once 09/26/14 1917 09/26/14 2132      Medications: Scheduled Meds: . antiseptic oral rinse  7 mL Mouth Rinse QID  . chlorhexidine  15 mL Mouth Rinse BID  . furosemide  40 mg Intravenous BID  . insulin aspart  0-9 Units Subcutaneous 6 times per day  . micafungin (MYCAMINE) IV  100 mg Intravenous Q24H  . pantoprazole (PROTONIX) IV  40 mg Intravenous Daily   Continuous Infusions: . Marland KitchenTPN (CLINIMIX-E) Adult 55 mL/hr at 10/05/14 1200  . sodium chloride    . Marland KitchenTPN (CLINIMIX-E) Adult     And  .  fat emulsion    . feeding supplement (VITAL HIGH PROTEIN)    . fentaNYL infusion INTRAVENOUS 50 mcg/hr (10/05/14 1000)  . heparin 2,000 Units/hr (10/05/14 1200)  . midazolam (VERSED) infusion Stopped (10/05/14 1126)   PRN Meds:.sodium chloride, acetaminophen (TYLENOL) oral liquid 160 mg/5 mL, acetaminophen, fentaNYL, fentaNYL, ipratropium-albuterol, labetalol, ondansetron (ZOFRAN) IV    Objective: Weight change: 3.4 oz (0.097 kg)  Intake/Output Summary (Last 24 hours) at 10/05/14 1214 Last data filed at 10/05/14 1200  Gross per 24 hour  Intake 3138.2 ml  Output   4285 ml  Net -1146.8 ml   Blood pressure 134/85, pulse 111, temperature 101.8 F (38.8 C), temperature source Oral, resp. rate 20, height 4\' 9"  (1.448 m), weight 166 lb (75.297 kg), SpO2 96 %. Temp:  [97.4 F (36.3 C)-101.8 F (38.8 C)] 101.8 F (38.8 C) (12/28 1128) Pulse Rate:  [71-120] 111 (12/28 1200) Resp:  [15-30] 20 (12/28 1200) BP: (90-154)/(50-104) 134/85 mmHg (12/28 1200) SpO2:  [91 %-100 %] 96 % (12/28 1200) FiO2 (%):  [40 %-100 %] 50 % (12/28 1114) Weight:  [166 lb (75.297 kg)] 166 lb (75.297 kg) (12/28 0351)  Physical Exam: General: intubated sedated. HEENT:  EOMI  CVS tachycardic normal r,  no murmur rubs or gallops Chest: +rhonchi Abdomen: soft distended, positive bowel sounds dressing in place Skin: Ischemic changes in both hands as well as some bullae in the left hand see pictures below:  10/04/14 left hand     10/05/14: left hand and arm      10/04/14 right hand    10/05/14 right hand      Neuro: nonfocal  CBC: CBC Latest Ref Rng 10/05/2014 10/04/2014 10/03/2014  WBC 4.0 - 10.5 K/uL 27.2(H) 32.4(H) 23.6(H)  Hemoglobin 12.0 - 15.0 g/dL 9.5(L) 10.1(L) 9.7(L)  Hematocrit 36.0 - 46.0 % 30.6(L) 31.5(L) 29.8(L)  Platelets 150 - 400 K/uL 694(H) 588(H) 436(H)      BMET  Recent Labs  10/05/14 0346 10/05/14 0500  NA 138 139  K 3.8 4.1  CL 94* 96  CO2 37* 37*  GLUCOSE  115* 115*  BUN 6 7  CREATININE 0.67 0.71  CALCIUM 7.6* 7.6*     Liver Panel   Recent Labs  10/05/14 0346  PROT 5.8*  ALBUMIN 1.5*  AST 45*  ALT 31  ALKPHOS 102  BILITOT 1.4*       Sedimentation Rate No results for input(s): ESRSEDRATE in the last 72 hours. C-Reactive Protein No results for input(s): CRP in the last 72 hours.  Micro Results: Recent Results (from the past 720 hour(s))  Urine culture     Status: None   Collection Time: 09/26/14  7:22 PM  Result Value Ref Range Status   Specimen Description URINE, CATHETERIZED  Final   Special Requests NONE  Final   Culture  Setup Time   Final    09/28/2014 00:50 Performed at [place] Performed at [facility]   Final   Culture NO GROWTH Performed at [facility]   Final   Report Status 09/29/2014 FINAL  Final  Blood culture (routine x 2)     Status: None   Collection Time: 09/26/14  8:56 PM  Result Value Ref Range Status   Specimen Description BLOOD LEFT ANTECUBITAL DRAWN BY RN  Final   Special Requests BOTTLES DRAWN AEROBIC ONLY 4CC  Final   Culture  Setup Time   Final    09/30/2014 20:55 Performed at [facility]    Culture   Final    CANDIDA GLABRATA Note: Gram Stain Report Called to,Read Back By and Verified With: WILKINSON R([facility])@1140  ON 09/30/14 BY Elza Rafter. Performed at [facility] Performed at [facility]    Report Status 10/03/2014 FINAL  Final  Blood culture (routine x 2)     Status: None   Collection Time: 09/26/14  9:00 PM  Result Value Ref Range Status   Specimen Description BLOOD LEFT ANTECUBITAL DRAWN BY RN  Final   Special Requests BOTTLES DRAWN AEROBIC ONLY 4CC  Final   Culture   Final    CANDIDA GLABRATA Note: Gram Stain Report Called to,Read Back By and Verified With: DICKENS B AT [facility] AT [id number] ON [id number] BY FORSYTH K Performed at [facility]    Report Status 10/04/2014 FINAL  Final  MRSA PCR  Screening     Status: None   Collection Time: 09/26/14 11:30 PM  Result Value Ref Range Status   MRSA by PCR NEGATIVE NEGATIVE Final    Comment:        The GeneXpert MRSA Assay (FDA approved for NASAL specimens only), is one component of a  comprehensive MRSA colonization surveillance program. It is not intended to diagnose MRSA infection nor to guide or monitor treatment for MRSA infections.   Body fluid culture     Status: None   Collection Time: 09/27/14  1:59 AM  Result Value Ref Range Status   Specimen Description FLUID PERITONEAL  Final   Special Requests ON SWAB  Final   Gram Stain   Final    RARE WBC PRESENT, PREDOMINANTLY PMN NO ORGANISMS SEEN Performed at [facility]    Culture   Final    FEW CANDIDA GLABRATA Note: CRITICAL RESULT CALLED TO, READ BACK BY AND VERIFIED WITH: BEVON BY INGRAMA 12/23 12N Performed at [facility]    Report Status 10/03/2014 FINAL  Final  Anaerobic culture     Status: None   Collection Time: 09/27/14  1:59 AM  Result Value Ref Range Status   Specimen Description FLUID PERITONEAL  Final   Special Requests NONE  Final   Gram Stain   Final    FEW WBC PRESENT, PREDOMINANTLY PMN NO SQUAMOUS EPITHELIAL CELLS SEEN NO ORGANISMS SEEN Performed at [facility]    Culture   Final    NO ANAEROBES ISOLATED Performed at [facility]    Report Status 10/02/2014 FINAL  Final  Culture, blood (routine x 2)     Status: None (Preliminary result)   Collection Time: 10/01/14  9:24 PM  Result Value Ref Range Status   Specimen Description BLOOD LEFT HAND  Final   Special Requests BOTTLES DRAWN AEROBIC ONLY 10CC  Final   Culture   Final           BLOOD CULTURE RECEIVED NO GROWTH TO DATE CULTURE WILL BE HELD FOR 5 DAYS BEFORE ISSUING A FINAL NEGATIVE REPORT Note: Culture results may be compromised due to an excessive volume of blood received in culture bottles. Performed at [facility]    Report Status  PENDING  Incomplete  Culture, blood (routine x 2)     Status: None (Preliminary result)   Collection Time: 10/01/14  9:35 PM  Result Value Ref Range Status   Specimen Description BLOOD RIGHT HAND  Final   Special Requests BOTTLES DRAWN AEROBIC ONLY 10CC  Final   Culture   Final           BLOOD CULTURE RECEIVED NO GROWTH TO DATE CULTURE WILL BE HELD FOR 5 DAYS BEFORE ISSUING A FINAL NEGATIVE REPORT Note: Culture results may be compromised due to an excessive volume of blood received in culture bottles. Performed at [facility]    Report Status PENDING  Incomplete    Studies/Results: Ct Abdomen Pelvis W Contrast  10/03/2014   CLINICAL DATA:  Six days postop from perforated gastric ulcer. Fever.  EXAM: CT ABDOMEN AND PELVIS WITH CONTRAST  TECHNIQUE: Multidetector CT imaging of the abdomen and pelvis was performed using the standard protocol following bolus administration of intravenous contrast.  CONTRAST:  181mL OMNIPAQUE IOHEXOL 300 MG/ML  SOLN  COMPARISON:  09/26/2014  FINDINGS: Patchy bibasilar airspace disease.  Small left pleural effusion.  NG tube tip in the body of the stomach  Surgical drain is in place within the lesser sac.  Free fluid is present in the left side of the abdomen and extending into the left pericolic gutter.  High-density material is present in the gallbladder.  Diffuse hepatic steatosis  Spleen, pancreas, adrenal glands, and kidneys are within normal limits  The abdominal wall has been sewn together.  Overlying subcutaneous fat has been left open to heal by secondary intention.  Small amount of free fluid in the anterior pelvis.  Foley catheter decompresses the bladder.  There is free fluid layering in the pelvis. There is enhancement of the peritoneal lining surrounding the free-fluid in the pelvis. There are no gas bubbles. It does not appear loculated. The free-fluid in the pelvis measures 5.2 x 4.2 cm.  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   IMPRESSION: Bibasilar airspace disease.  Small left pleural effusion.  Postop changes with surgical drains in place.  There is scattered free fluid in the abdomen and pelvis. There is some enhancement of the peritoneal lining about the free-fluid in the pelvis. See above for details.   Electronically Signed   By: Maryclare Bean M.D.   On: 10/03/2014 13:47   Dg Chest Port 1 View  10/05/2014   CLINICAL DATA:  Respiratory failure.  EXAM: PORTABLE CHEST - 1 VIEW  COMPARISON:  10/04/2014.  FINDINGS: Endotracheal tube, right IJ line, NG tube in stable position. Heart size stable. Diffuse bilateral pulmonary alveolar infiltrates are present. Subsegmental atelectasis left lung base. No pleural effusion or pneumothorax. No acute bony abnormality. Prior cervical spine fusion.  IMPRESSION: 1. Lines and tubes in stable position. 2. Persistent bilateral pulmonary infiltrates. Left lower lobe subsegmental atelectasis.   Electronically Signed   By: Marcello Moores  Register   On: 10/05/2014 07:31   Dg Chest Port 1 View  10/04/2014   CLINICAL DATA:  Acute respiratory failure  EXAM: PORTABLE CHEST - 1 VIEW  COMPARISON:  10/03/2014  FINDINGS: Cardiomediastinal silhouette is stable. Endotracheal tube in place with tip 3.8 cm above the carina. Stable right IJ central line position. NG tube in place with tip in mid stomach. Slight worsening in aeration with airspace opacification left lung and right upper lobe. Findings may be due to bilateral multifocal pneumonia or asymmetric edema.  IMPRESSION: Stable support apparatus. Slight worsening in aeration. Again noted airspace opacity left lung and right upper lobe which may be due to multifocal pneumonia or asymmetric edema.   Electronically Signed   By: Lahoma Crocker M.D.   On: 10/04/2014 10:10      Assessment/Plan:  Principal Problem:   Pneumoperitoneum Active Problems:   Sepsis   Altered mental state   Brachial artery occlusion   Acute respiratory failure with hypoxia   Severe  sepsis   Perforated gastric ulcer   Acute respiratory failure   Artery occlusion   Blood poisoning   Candidemia   Screen for STD (sexually transmitted disease)   Abdominal abscess   Septic thrombophlebitis   Elevated WBCs   Fever   Limb ischemia    [name] is a 48 y.o. female with  ICU stay from pneumonia, history trach and PEG who presented with 1 day of abdominal pain and found pneumoperitoneum on CT and in OR had perforated prepyloric gastric ulcer. Also with clot in hand requiring thrombectomy in left brachial, radial and ulnar arteries. She has been on TPN and has central line. She has now FUNGEMIA  with Candida. She has  also been given 7 days of f broad spectrum abx for HCAP and her perforated abdomen--I stopped these yestereday  #1 Candidemia: Glabrata which  IS NOT RELIABLY AZOLE sensitive--thus MICA needed   While source likely is the peritoneum given that she had perforation and she has yeast from abdominal abscess cultures from Operating room, she also has central line and is getting TPN  which are set up for this type of infection and certainly do not make treating it any easier.  IDEALLY we would want to give her a LINE HOLIDAY with no central lines and NO TPN so that we could ensure clearance of her FUNGEMIA but that is very difficult at present given her multiple IV medicines. There also was an attempt at a PICC line placement today which failed a recent vascular events are also probably making things difficult.   Infectious disease Society of [place] guidelines typically also call for a formal dedicated ophthalmologic exam.  Given that she presented with FUNGEMIA and a thrombus should consider if this is fungal septic thrombophlebitis  I would therefore give her one MONTH of IV MICAFUNGIN with start date being some day in the future when we can give her a "line holiday" and get repeat blood cultures with no line in place   #2 Perforated viscus:  I took her off  antibacterial therapy 2 days ago   CT scan shows presence of fluid with enhancing rim in abdomen still present  I WOULD RECOMMEND ASKING IR TO DRAIN THIS AND SEND MATERIAL FOR BACTERIAL AND FUNGAL CULTURES And I DISCUSSED WITH DR Felisa Bonier from [facility]  #3 HCAP: She is SP  7 days of vancomycin and cefepime .   Given her rising WBC and fevers would also consider sending non bronch BAL vs tracheal aspirate from lungs for culture as well. Would hold on broadening abx until we have sample from the abdomen  Dr Baxter Flattery to take over the service tomorrow thru 10/09/2014.   LOS: 9 days   Alcide Evener 10/05/2014, 12:14 PM

## 2014-10-05 NOTE — H&P (View-Only) (Signed)
Consult Note  Patient name: [name] MRN: [id number] DOB: 09/04/1966 Sex: female  Consulting Physician:  ER  Reason for Consult:  Chief Complaint  Patient presents with  . Abdominal Pain    HISTORY OF PRESENT ILLNESS: This is a 48 year old female who presented to the [facility] emergency department earlier today with right-sided abdominal pain for 2 days.  She underwent a noncontrasted CT scan which showed free air and proximal jejunal perforation.  She was noted to have discoloration in her left hand while at the emergency department.  She states that her hand that felt different or weird for about 1 day.  She does have a history of neuropathy in that hand.  This was clearly different.  The patient suffers from COPD and has a current smoker.  She also suffers from anxiety, and mass, fibromyalgia and chronic low back pain.  She is also treated for arthritis and reflux disease.  She has a history of tracheostomy and PEG tube placement 1 year ago at [facility].  Past Medical History  Diagnosis Date  . Anxiety   . Depression   . COPD (chronic obstructive pulmonary disease)   . MS (multiple sclerosis)   . Fibromyalgia   . Impaired fasting glucose   . 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 Eating disorder   . Chronic low back pain   . PONV (postoperative nausea and vomiting)   . Chronic bronchitis     "yearly" (02/11/2013)  . Borderline diabetes   . GERD (gastroesophageal reflux disease)   . Migraine     "I use to" (02/11/2013)  . Arthritis     "hands & feet" (02/11/2013)  . Peripheral neuropathy     [name] 02/11/2013  . B12 deficiency anemia     [name] 02/11/2013  . Chronic pain syndrome     [name] 02/11/2013  . UTI (lower urinary tract infection) 02/11/2013    [name] 02/11/2013  . Chronic edema     BLE/notes 02/11/2013  . Ataxia   . Peripheral neuropathy   . Muscle weakness of lower extremity     bilateral    Past Surgical History    Procedure Laterality Date  . Abdominal hysterectomy  ~ 1987; ~ 2004    "woodward; ferguson" (02/11/2013)  . Cesarean section  9798; 1984; 1986  . Anterior cervical decomp/discectomy fusion  2009; 2011  . Tonsillectomy  1990's?  . Cataract extraction w/phaco  06/20/2011    Procedure: CATARACT EXTRACTION PHACO AND INTRAOCULAR LENS PLACEMENT (IOC);  Surgeon: [name] T. Gershon Crane;  Location: [facility];  Service: Ophthalmology;  Laterality: Left;  CDE 1.81  . Cataract extraction w/phaco  07/04/2011    Procedure: CATARACT EXTRACTION PHACO AND INTRAOCULAR LENS PLACEMENT (IOC);  Surgeon: [name] T. Gershon Crane;  Location: [facility];  Service: Ophthalmology;  Laterality: Right;  CDE: 1.76  . Yag laser application Right 06/29/1940    Procedure: YAG LASER APPLICATION;  Surgeon: [name] T. Gershon Crane, MD;  Location: [facility];  Service: Ophthalmology;  Laterality: Right;  . Yag laser application Left 7/40/8144    Procedure: YAG LASER APPLICATION;  Surgeon: [name] T. Gershon Crane, MD;  Location: [facility];  Service: Ophthalmology;  Laterality: Left;  . Appendectomy      History   Social History  . Marital Status: Divorced    Spouse Name: N/A    Number of Children: N/A  . Years of Education: N/A   Occupational History  . Not  on file.   Social History Main Topics  . Smoking status: Current Every Day Smoker -- 1.00 packs/day for 33 years    Types: Cigarettes  . Smokeless tobacco: Former Systems developer     Comment: 02/11/2013 offered smoking cessation materials; pt declines  . Alcohol Use: No  . Drug Use: No  . Sexual Activity: Not Currently    Birth Control/ Protection: Surgical   Other Topics Concern  . Not on file   Social History Narrative    Family History  Problem Relation Age of Onset  . Anesthesia problems Neg Hx   . Hypotension Neg Hx   . Malignant hyperthermia Neg Hx   . Pseudochol deficiency Neg Hx   . Hyperlipidemia Mother   . Heart disease Mother   . Cancer Mother     lung  . Diabetes Father   . Heart disease Father    . Cancer Maternal Grandmother     breast    Allergies as of 09/26/2014 - Review Complete 09/26/2014  Allergen Reaction Noted  . Ambien [zolpidem tartrate] Other (See Comments) 02/17/2013  . Ceftin [cefuroxime axetil] Nausea And Vomiting 12/30/2012  . Hctz [hydrochlorothiazide] Other (See Comments) 12/30/2012  . Hydrocodone Nausea Only 02/08/2013  . Lodine [etodolac] Hives 12/30/2012  . Promethazine Other (See Comments) 02/11/2013  . Roxicodone [oxycodone hcl] Swelling 01/13/2013  . Codeine Rash 06/16/2011  . Latex Rash 07/24/2011  . Penicillins Rash 06/16/2011    No current facility-administered medications on file prior to encounter.   Current Outpatient Prescriptions on File Prior to Encounter  Medication Sig Dispense Refill  . citalopram (CELEXA) 20 MG tablet Take 20 mg by mouth daily at 12 noon.     . clonazePAM (KLONOPIN) 0.5 MG tablet Take 0.5 mg by mouth 2 (two) times daily as needed for anxiety.    . cyclobenzaprine (FLEXERIL) 10 MG tablet TAKE (1) TABLET BY MOUTH AT BEDTIME. 30 tablet 2  . folic acid (FOLVITE) 1 MG tablet Take 1 tablet (1 mg total) by mouth daily. 60 tablet 2  . gabapentin (NEURONTIN) 300 MG capsule One in am ,one 3pm, 2qhs (Patient taking differently: Take 300-600 mg by mouth 3 (three) times daily. One in am ,one at 3pm, and 2 qhs.) 120 capsule 6  . meloxicam (MOBIC) 15 MG tablet TAKE 1 TABLET BY MOUTH DAILY. 30 tablet 4  . Multiple Vitamin (MULTIVITAMIN WITH MINERALS) TABS tablet Take 1 tablet by mouth daily at 12 noon.    Marland Kitchen oxyCODONE-acetaminophen (PERCOCET) 10-325 MG per tablet Take 1 tablet by mouth every 4 (four) hours as needed for pain (no greater than 5 per day). 150 tablet 0  . potassium chloride (K-DUR) 10 MEQ tablet Take 20 mEq by mouth daily at 12 noon.     . thiamine (VITAMIN B-1) 100 MG tablet Take 100 mg by mouth daily.    . vitamin B-12 (CYANOCOBALAMIN) 1000 MCG tablet Take 1,000 mcg by mouth daily at 12 noon.    Marland Kitchen albuterol (PROVENTIL  HFA;VENTOLIN HFA) 108 (90 BASE) MCG/ACT inhaler Inhale 2 puffs into the lungs every 6 (six) hours as needed for wheezing or shortness of breath (copd).    Marland Kitchen albuterol (PROVENTIL) (2.5 MG/3ML) 0.083% nebulizer solution Take 2.5 mg by nebulization every 4 (four) hours as needed for wheezing or shortness of breath (copd).    . nicotine (NICODERM CQ - DOSED IN MG/24 HOURS) 14 mg/24hr patch Place 1 patch (14 mg total) onto the skin daily. 28 patch 0  REVIEW OF SYSTEMS: Cardiovascular: No chest pain, chest pressure, palpitations, orthopnea, or dyspnea on exertion. No claudication or rest pain,  No history of DVT or phlebitis. Pulmonary: No productive cough, asthma or wheezing. Neurologic: No weakness, paresthesias, aphasia, or amaurosis. No dizziness.  No left hand Hematologic: No bleeding problems or clotting disorders. Musculoskeletal: No joint pain or joint swelling. Gastrointestinal: No blood in stool or hematemesis, abd pain Genitourinary: No dysuria or hematuria. Psychiatric:: No history of major depression. Integumentary: No rashes or ulcers. Constitutional: No fever or chills.  PHYSICAL EXAMINATION: General: The patient appears their stated age.  Vital signs are BP 119/84 mmHg  Pulse 129  Temp(Src) 100.7 F (38.2 C) (Other (Comment))  Resp 21  Ht 4\' 9"  (1.448 m)  Wt 156 lb (70.761 kg)  BMI 33.75 kg/m2  SpO2 94% Pulmonary: Respirations are non-labored HEENT:  No gross abnormalities Abdomen: Soft and non-tender  Musculoskeletal: There are no major deformities.   Neurologic: No focal weakness or paresthesias are detected, Skin: There are no ulcer or rashes noted. Psychiatric: The patient has normal affect. Cardiovascular: There is a regular rate and rhythm without significant murmur appreciated.  Mild left hand.  No left radial or ulnar Doppler signals.  There is a brisk radial signal.  Good dorsalis pedis Doppler signals  Diagnostic Studies: CT scan shows pneumoperitoneum  with free fluid.  No significant calcification at the origin of the celiac or superior mesenteric artery.    Assessment:  Pneumoperitoneum Ischemic left hand Plan: I discussed with the patient and her husband that she needs to go the operating room for exploration of her left hand simultaneously as she undergoes abdominal exploration.  I discussed that would attempt thrombectomy of the radial and ulnar artery on the left.  I'll also be available should a mesenteric thrombus be Identified.  This is a limb threatening situation.  Source of possible thrombus is unknown     V. Leia Alf, M.D. [facility] Office: [phone] Pager:  [phone]

## 2014-10-05 NOTE — Progress Notes (Signed)
Wasted 25 cc expired versed in sink witnessed by [employer].

## 2014-10-05 NOTE — Progress Notes (Signed)
Wasted 100 cc fentanyl (expired) in sink witnessed by [employer].

## 2014-10-05 NOTE — Progress Notes (Signed)
Patient ID: [name], female   DOB: Nov 26, 1965, 48 y.o.   MRN: [id number] Request received for aspiration/drainage of pelvic fluid collection in pt. Imaging reviewed by Dr. Earleen Newport. Collection is amenable to aspirate however it would require TG approach . In a pt who is currently intubated this would be technically challenging. Would prefer to wait if possible for extubation. Please contact Dr. Earleen Newport at [phone] with any questions.

## 2014-10-05 NOTE — Progress Notes (Signed)
Echocardiogram 2D Echocardiogram has been performed.  [name] 10/05/2014, 10:51 AM

## 2014-10-05 NOTE — Progress Notes (Signed)
NUTRITION FOLLOW-UP  DOCUMENTATION CODES Per approved criteria  -Obesity Unspecified   INTERVENTION:  Initiate TF via NGT with Vital High Protein at 10 ml/h, do not advance at this time per MD. When able to advance TF, recommend increase by 10 ml every 4 hours to goal rate of 45 ml/h to provide 1080 kcals, 95 gm protein, 903 ml free water daily.  Continue TPN dosing per Pharmacy, adjust rate based on TF tolerance and advancement.   NUTRITION DIAGNOSIS: Inadequate oral intake related to inability to eat as evidenced by NPO s/p bowel surgery, peritonitis; ongoing.   Goal:  Enteral nutrition to provide 60-70% of estimated calorie needs (22-25 kcals/kg ideal body weight) and 100% of estimated protein needs, based on ASPEN guidelines for hypocaloric, high protein feeding in critically ill obese individuals, ongoing.  Monitor:  TF tolerance/adequacy, TPN prescription, weight trend, labs, vent status.  ASSESSMENT: Pt with h/o MS admitted with abdominal pain.  Patient found to have pneumoperitoneum 2/2 to small bowel rupture.  Also with brachial artery clot s/p surgery to repair both.   Patient remains intubated on ventilator support MV: 14.9 L/min Temp (24hrs), Avg:100.1 F (37.8 C), Min:97.4 F (36.3 C), Max:101.4 F (38.6 C)  TPN: Clinimix E 5/15 @ 55 ml/hr providing 937 kcal and 66 grams of protein per day. No lipids for now.  NG tube to low intermittent suction; to start TF today via NGT after procedure today per discussion with RN. Will start TF at 4 PM.  Height: Ht Readings from Last 1 Encounters:  09/27/14 4\' 9"  (1.448 m)    Weight: Wt Readings from Last 1 Encounters:  10/05/14 166 lb (75.297 kg)   10/01/14 176 lb 5.9 oz (80 kg)    BMI:  Body mass index is 35.91 kg/(m^2).  Estimated Nutritional Needs: Kcal: 1901 For hypocaloric feeding: [phone] kcals/day Protein: >/= 85 grams Fluid: 1.5 L/day  Skin: surgical incisions  Diet Order: .TPN (CLINIMIX-E)  Adult Diet NPO time specified TPN (CLINIMIX-E) Adult   Intake/Output Summary (Last 24 hours) at 10/05/14 1047 Last data filed at 10/05/14 1000  Gross per 24 hour  Intake 3182.6 ml  Output   4285 ml  Net -1102.4 ml    Last BM: 12/18  Labs:   Recent Labs Lab 10/03/14 0500  10/04/14 0400 10/04/14 1944 10/05/14 0346 10/05/14 0500  NA 140  < > 141  --  138 139  K 2.9*  < > 3.6 3.3* 3.8 4.1  CL 108  < > 99  --  94* 96  CO2 27  < > 32  --  37* 37*  BUN <5*  < > <5*  --  6 7  CREATININE 0.72  < > 0.68  --  0.67 0.71  CALCIUM 7.7*  < > 8.0*  --  7.6* 7.6*  MG 1.8  --  1.5 2.5 2.2  --   PHOS 1.7*  --  3.2  --  3.5  --   GLUCOSE 138*  < > 118*  --  115* 115*  < > = values in this interval not displayed.  CBG (last 3)   Recent Labs  10/05/14 0345 10/05/14 0347 10/05/14 0725  GLUCAP 51* 112* 122*    Scheduled Meds: . antiseptic oral rinse  7 mL Mouth Rinse QID  . chlorhexidine  15 mL Mouth Rinse BID  . furosemide  40 mg Intravenous BID  . insulin aspart  0-9 Units Subcutaneous 6 times per day  . micafungin [facility]) IV  100 mg Intravenous Q24H  . pantoprazole (PROTONIX) IV  40 mg Intravenous Daily    Continuous Infusions: . Marland KitchenTPN (CLINIMIX-E) Adult 55 mL/hr at 10/05/14 1000  . sodium chloride    . Marland KitchenTPN (CLINIMIX-E) Adult     And  . fat emulsion    . fentaNYL infusion INTRAVENOUS 50 mcg/hr (10/05/14 1000)  . heparin 2,000 Units/hr (10/05/14 1000)  . midazolam (VERSED) infusion 1 mg/hr (10/05/14 1000)    Molli Barrows, RD, LDN, [place] Pager [phone] After Hours Pager [phone]

## 2014-10-05 NOTE — Progress Notes (Signed)
8 Days Post-Op  Subjective: Weaning on vent  Objective: Vital signs in last 24 hours: Temp:  [97.4 F (36.3 C)-101.4 F (38.6 C)] 101.2 F (38.4 C) (12/28 0728) Pulse Rate:  [71-117] 110 (12/28 0800) Resp:  [16-26] 17 (12/28 0800) BP: (90-155)/(50-104) 133/70 mmHg (12/28 0800) SpO2:  [88 %-99 %] 94 % (12/28 0800) FiO2 (%):  [40 %-50 %] 50 % (12/28 0800) Weight:  [166 lb (75.297 kg)] 166 lb (75.297 kg) (12/28 0351) Last BM Date: 09/25/14  Intake/Output from previous day: 12/27 0701 - 12/28 0700 In: 3332.9 [I.V.:1627.9; NG/GT:90; IV Piggyback:350; TPN:1265] Out: 5460 [Urine:5245; Emesis/NG output:200; Drains:15] Intake/Output this shift: Total I/O In: 108 [I.V.:53; TPN:55] Out: 350 [Urine:350]  General appearance: no distress Resp: clear to auscultation bilaterally Cardio: regular rate and rhythm and 100s GI: soft, wound clean, JP serous and low output  Lab Results:   Recent Labs  10/04/14 0400 10/05/14 0346  WBC 32.4* 27.2*  HGB 10.1* 9.5*  HCT 31.5* 30.6*  PLT 588* 694*   BMET  Recent Labs  10/04/14 0400 10/04/14 1944 10/05/14 0346  NA 141  --  138  K 3.6 3.3* 3.8  CL 99  --  94*  CO2 32  --  37*  GLUCOSE 118*  --  115*  BUN <5*  --  6  CREATININE 0.68  --  0.67  CALCIUM 8.0*  --  7.6*   PT/INR  Recent Labs  10/05/14 0732  LABPROT 15.8*  INR 1.25   ABG  Recent Labs  10/02/14 0920  PHART 7.249*  HCO3 21.9    Studies/Results: Ct Abdomen Pelvis W Contrast  10/03/2014   CLINICAL DATA:  Six days postop from perforated gastric ulcer. Fever.  EXAM: CT ABDOMEN AND PELVIS WITH CONTRAST  TECHNIQUE: Multidetector CT imaging of the abdomen and pelvis was performed using the standard protocol following bolus administration of intravenous contrast.  CONTRAST:  147mL OMNIPAQUE IOHEXOL 300 MG/ML  SOLN  COMPARISON:  09/26/2014  FINDINGS: Patchy bibasilar airspace disease.  Small left pleural effusion.  NG tube tip in the body of the stomach  Surgical  drain is in place within the lesser sac.  Free fluid is present in the left side of the abdomen and extending into the left pericolic gutter.  High-density material is present in the gallbladder.  Diffuse hepatic steatosis  Spleen, pancreas, adrenal glands, and kidneys are within normal limits  The abdominal wall has been sewn together. Overlying subcutaneous fat has been left open to heal by secondary intention.  Small amount of free fluid in the anterior pelvis.  Foley catheter decompresses the bladder.  There is free fluid layering in the pelvis. There is enhancement of the peritoneal lining surrounding the free-fluid in the pelvis. There are no gas bubbles. It does not appear loculated. The free-fluid in the pelvis measures 5.2 x 4.2 cm.  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  IMPRESSION: Bibasilar airspace disease.  Small left pleural effusion.  Postop changes with surgical drains in place.  There is scattered free fluid in the abdomen and pelvis. There is some enhancement of the peritoneal lining about the free-fluid in the pelvis. See above for details.   Electronically Signed   By: Maryclare Bean M.D.   On: 10/03/2014 13:47   Dg Chest Port 1 View  10/05/2014   CLINICAL DATA:  Respiratory failure.  EXAM: PORTABLE CHEST - 1 VIEW  COMPARISON:  10/04/2014.  FINDINGS: Endotracheal tube, right IJ line, NG tube in  stable position. Heart size stable. Diffuse bilateral pulmonary alveolar infiltrates are present. Subsegmental atelectasis left lung base. No pleural effusion or pneumothorax. No acute bony abnormality. Prior cervical spine fusion.  IMPRESSION: 1. Lines and tubes in stable position. 2. Persistent bilateral pulmonary infiltrates. Left lower lobe subsegmental atelectasis.   Electronically Signed   By: Marcello Moores  Register   On: 10/05/2014 07:31   Dg Chest Port 1 View  10/04/2014   CLINICAL DATA:  Acute respiratory failure  EXAM: PORTABLE CHEST - 1 VIEW  COMPARISON:  10/03/2014  FINDINGS:  Cardiomediastinal silhouette is stable. Endotracheal tube in place with tip 3.8 cm above the carina. Stable right IJ central line position. NG tube in place with tip in mid stomach. Slight worsening in aeration with airspace opacification left lung and right upper lobe. Findings may be due to bilateral multifocal pneumonia or asymmetric edema.  IMPRESSION: Stable support apparatus. Slight worsening in aeration. Again noted airspace opacity left lung and right upper lobe which may be due to multifocal pneumonia or asymmetric edema.   Electronically Signed   By: Lahoma Crocker M.D.   On: 10/04/2014 10:10    Anti-infectives: Anti-infectives    Start     Dose/Rate Route Frequency Ordered Stop   09/30/14 1300  micafungin (MYCAMINE) 100 mg in sodium chloride 0.9 % 100 mL IVPB    Comments:  Pharmacy may adjust as needed   100 mg100 mL/hr over 1 Hours Intravenous Every 24 hours 09/30/14 1213     09/27/14 2100  ceFEPIme (MAXIPIME) 2 g in dextrose 5 % 50 mL IVPB  Status:  Discontinued     2 g100 mL/hr over 30 Minutes Intravenous Every 24 hours 09/27/14 0603 09/27/14 1103   09/27/14 2000  vancomycin (VANCOCIN) IVPB 1000 mg/200 mL premix  Status:  Discontinued     1,000 mg200 mL/hr over 60 Minutes Intravenous Every 24 hours 09/27/14 0603 09/27/14 1103   09/27/14 1400  vancomycin (VANCOCIN) IVPB 1000 mg/200 mL premix  Status:  Discontinued     1,000 mg200 mL/hr over 60 Minutes Intravenous Every 12 hours 09/27/14 1103 10/02/14 1348   09/27/14 1200  ceFEPIme (MAXIPIME) 2 g in dextrose 5 % 50 mL IVPB  Status:  Discontinued     2 g100 mL/hr over 30 Minutes Intravenous Every 12 hours 09/27/14 1103 10/02/14 1348   09/26/14 1930  vancomycin (VANCOCIN) IVPB 1000 mg/200 mL premix     1,000 mg200 mL/hr over 60 Minutes Intravenous  Once 09/26/14 1917 09/26/14 2210   09/26/14 1930  ceFEPIme (MAXIPIME) 2 g in dextrose 5 % 50 mL IVPB     2 g100 mL/hr over 30 Minutes Intravenous  Once 09/26/14 1917 09/26/14 2132       Assessment/Plan: s/p Procedure(s): Left Radial, Brachial, and Ulnar Thrombectomy; Left Brachial to Radial Bypass Graft using Greater Saphenous vein graft from Left Thigh; Left Saphenous Vein Harvest; Intraoperative Arteriogram; Intra-arterial administration of TPA (Left) S/P repair perf pre-pyloric ulcer - start TF 10cc/hr today VDRF - per CCM Fungemia - appreciate ID assist, Micafungin S/P thrombectomy LUE - heparin per VVS  LOS: 9 days    Kristalyn Bergstresser E 10/05/2014

## 2014-10-05 NOTE — Procedures (Signed)
Central Venous Catheter Insertion Procedure Note [name] [id number] 1966-05-13  Procedure: Insertion of Central Venous Catheter Indications: Assessment of intravascular volume, Drug and/or fluid administration and Frequent blood sampling  Procedure Details Consent: Unable to obtain consent because of altered level of consciousness. Time Out: Verified patient identification, verified procedure, site/side was marked, verified correct patient position, special equipment/implants available, medications/allergies/relevent history reviewed, required imaging and test results available.  Performed  Maximum sterile technique was used including antiseptics, cap, gloves, gown, hand hygiene, mask and sheet. Skin prep: Chlorhexidine; local anesthetic administered A antimicrobial bonded/coated triple lumen catheter was placed in the left internal jugular vein using the Seldinger technique.  Evaluation Blood flow good Complications: No apparent complications Patient did tolerate procedure well. Chest X-ray ordered to verify placement.  CXR: pending.  Procedure performed under direct ultrasound guidance for real time vessel cannulation.      [name], [place] Pgr: [phone]  or [phone] 10/05/2014, 10:03 PM   Supervised by me  Tammy Mead V.

## 2014-10-05 NOTE — Progress Notes (Signed)
PULMONARY / CRITICAL CARE MEDICINE HISTORY AND PHYSICAL EXAMINATION   Name: [name] MRN: [id number] DOB: 1966-01-03    ADMISSION DATE:  09/26/2014  PRIMARY SERVICE: PCCM  CHIEF COMPLAINT:  Abd pain  BRIEF PATIENT DESCRIPTION: 8yof smoker with PMH of MS, depression presents with abd pain, found to have pneumoperitoneum 2/2 small bowel rupture, also with new cold L hand.  Required surgery  for DU perf & left brachial bypass, fungemia noted on admission cultures  SIGNIFICANT EVENTS / STUDIES:  09/26/14: Admitted, CT Abd with pneumoperitoneum, cold L hand concerning for arterial clot as well. 12/20 > ex-lap [name]) with omental patch for perforated prepyloric gastric ulcer; L brachial and ulnar artery embolectomy ([place]) 12/20 LE doppler >>neg 12/20 >>Left brachial to left radial artery bypass with left leg greater saphenous vein for Recurrent occlusion  12/20 Self extubated - had to be reintubated 12/21 echo >>nml LVEF 12/23 TEE ordered 12/24 TEE not yet completed 12/26 CT abdomen >> pelvic fluid  LINES / TUBES: R IJ CVL 12/20 >>  JP drain RUQ 12/20 >>  ETT 12/19 >>    SUBJECTIVE: Febrile RASS-2 Good Uo with lasix  VITAL SIGNS: Temp:  [97.4 F (36.3 C)-101.4 F (38.6 C)] 101.2 F (38.4 C) (12/28 0728) Pulse Rate:  [71-117] 110 (12/28 0900) Resp:  [16-26] 18 (12/28 0900) BP: (90-155)/(50-104) 126/76 mmHg (12/28 0900) SpO2:  [91 %-99 %] 95 % (12/28 0900) FiO2 (%):  [40 %-50 %] 50 % (12/28 0800) Weight:  [75.297 kg (166 lb)] 75.297 kg (166 lb) (12/28 0351) HEMODYNAMICS:    Vent Mode:  [-] CPAP FiO2 (%):  [40 %-50 %] 50 % Set Rate:  [16 bmp] 16 bmp Vt Set:  [450 mL] 450 mL PEEP:  [5 cmH20] 5 cmH20 Pressure Support:  [15 cmH20] 15 cmH20 Plateau Pressure:  [16 cmH20-19 cmH20] 18 cmH20 INTAKE / OUTPUT: Intake/Output      12/27 0701 - 12/28 0700 12/28 0701 - 12/29 0700   I.V. (mL/kg) 1627.9 (21.6) 99 (1.3)   NG/GT 90    IV Piggyback 350    TPN 1265 110    Total Intake(mL/kg) 3332.9 (44.3) 209 (2.8)   Urine (mL/kg/hr) 5245 (2.9) 350 (1.8)   Emesis/NG output 200 (0.1)    Drains 15 (0)    Total Output 5460 350   Net -2127.1 -141          PHYSICAL EXAMINATION:  Gen: RASS +1 to -2, not F/C HEENT: WNL PULM: Clear anteriorly CV: Reg, no  AB: absent BS, surgical dressing Ext: warm, no edema Neuro: no focal deficits  LABS: I have reviewed all of today's lab results. Relevant abnormalities are discussed in the A/P section  CXR: edema pattern, improved  ASSESSMENT / PLAN:  Principal Problem:   Pneumoperitoneum Active Problems:   Sepsis   Altered mental state   Brachial artery occlusion   Acute respiratory failure with hypoxia   Severe sepsis   Perforated gastric ulcer   Acute respiratory failure   Artery occlusion   Blood poisoning   Candidemia   Screen for STD (sexually transmitted disease)   Abdominal abscess   Septic thrombophlebitis   Elevated WBCs   Fever   PULMONARY A: ETT 12/19 >>  Acute respiratory failure Pulm infiltrates P:   Cont full vent support - settings reviewed and/or adjusted Cont vent bundle Daily SBT if/when meets criteria with goal extubation   diurese  CARDIOVASCULAR A: Sinus tachycardia, reactive Hypertension, controlled Multiple thromboemboli P:   Cont labetalol  prn Cont heparin gtt TEE pending  RENAL A: AKI, resolved Hypokalemia/hypophos P:   Monitor BMET intermittently Monitor I/Os Correct electrolytes as indicated  GASTROINTESTINAL A: Perforated gastric ulcer and pneumoperitoneum  Post laparotomy P:   Post op care per surgery Cont TPN, starting trickle TFs Cont SUP  HEMATOLOGIC A: mild thrombocytopenia P:   DVT px: full heparin Monitor CBC intermittently Transfuse per usual ICU guidelines  INFECTIOUS A: Peritonitis after bowel perforation Severe sepsis Fungemia -unclear source Persistent fever, rising WC P:   Blood cx: 09/26/14-->  NGTD 12/20  peritoneal fluid >> few yeast  Vanc: 12/19 >> 12/25 Zosyn:  12/19 >> 12/25 Micafungin 12/23 >>   ID following ,consider changing lines since fever persists -doubt line sepsis since cx on admit growing yeast Pelvic fluid ? drain  ENDOCRINE A:  Hypoglycemia, resolved Intermittent hyperglycemia P:   Cont SSI  NEUROLOGIC A: Post op pain Acute encephalopathy/ delerium P:   RASS goal 0 Cont PAD protocol , dc versed gtt -use intermittent Holding home SSRI and benzo   Social / Family:   TODAY'S SUMMARY: Need diuresis & better control of delerium for extubation, TEE pending   The patient is critically ill with multiple organ systems failure and requires high complexity decision making for assessment and support, frequent evaluation and titration of therapies, application of advanced monitoring technologies and extensive interpretation of multiple databases. Critical Care Time devoted to patient care services described in this note independent of APP time is 35 minutes.    [name] MD  10/05/2014, 9:35 AM

## 2014-10-05 NOTE — Progress Notes (Signed)
PARENTERAL NUTRITION CONSULT NOTE - FOLLOW UP  Pharmacy Consult for TPN Indication: S/P exp lap for perforated gastric ulcer  Allergies  Allergen Reactions  . Ambien [Zolpidem Tartrate] Other (See Comments)    Crazy dreams  . Ceftin [Cefuroxime Axetil] Nausea And Vomiting  . Hctz [Hydrochlorothiazide] Other (See Comments)    Urinary retention  . Hydrocodone Nausea Only  . Lodine [Etodolac] Hives  . Promethazine Other (See Comments)    Hallucinations.   . Roxicodone [Oxycodone Hcl] Swelling  . Codeine Rash  . Latex Rash  . Penicillins Rash    Patient Measurements: Height: 4\' 9"  (144.8 cm) Weight: 166 lb (75.297 kg) IBW/kg (Calculated) : 38.6 Adjusted Body Weight:  Usual Weight:   Vital Signs: Temp: 100.2 F (37.9 C) (12/28 0400) Temp Source: Oral (12/28 0400) BP: 126/104 mmHg (12/28 0600) Pulse Rate: 104 (12/28 0600) Intake/Output from previous day: 12/27 0701 - 12/28 0700 In: 3332.9 [I.V.:1627.9; NG/GT:90; IV Piggyback:350; TPN:1265] Out: 5460 [Urine:5245; Emesis/NG output:200; Drains:15] Intake/Output from this shift: Total I/O In: 1795.5 [I.V.:900.5; NG/GT:90; IV Piggyback:200; TPN:605] Out: 2694 [Urine:1540; Emesis/NG output:100; Drains:15]  Labs:  Recent Labs  10/03/14 0500 10/04/14 0400 10/05/14 0346  WBC 23.6* 32.4* 27.2*  HGB 9.7* 10.1* 9.5*  HCT 29.8* 31.5* 30.6*  PLT 436* 588* 694*     Recent Labs  10/03/14 0500 10/03/14 0512 10/03/14 1645 10/04/14 0400 10/04/14 1944 10/05/14 0346  NA 140  --  140 141  --  138  K 2.9*  --  3.5 3.6 3.3* 3.8  CL 108  --  104 99  --  94*  CO2 27  --  30 32  --  37*  GLUCOSE 138*  --  190* 118*  --  115*  BUN <5*  --  <5* <5*  --  6  CREATININE 0.72  --  0.67 0.68  --  0.67  CALCIUM 7.7*  --  7.8* 8.0*  --  7.6*  MG 1.8  --   --  1.5 2.5 2.2  PHOS 1.7*  --   --  3.2  --  3.5  PROT  --   --   --   --   --  5.8*  ALBUMIN  --   --   --   --   --  1.5*  AST  --   --   --   --   --  45*  ALT  --   --   --    --   --  31  ALKPHOS  --   --   --   --   --  102  BILITOT  --   --   --   --   --  1.4*  TRIG  --  174*  --   --   --  222*   Estimated Creatinine Clearance: 71.6 mL/min (by C-G formula based on Cr of 0.67).    Recent Labs  10/05/14 0343 10/05/14 0345 10/05/14 0347  GLUCAP 30* 51* 112*    Medications:  Scheduled:  . antiseptic oral rinse  7 mL Mouth Rinse QID  . chlorhexidine  15 mL Mouth Rinse BID  . furosemide  40 mg Intravenous BID  . insulin aspart  0-9 Units Subcutaneous 6 times per day  . micafungin (MYCAMINE) IV  100 mg Intravenous Q24H  . pantoprazole (PROTONIX) IV  40 mg Intravenous Daily    Insulin Requirements: 2 units Novolog SSI/24hr, 12 units insulin in TPN  Nutritional Requirements:  [phone] Kcal with 60-65g protein per RD recommendations (22-25 kcals/kg ideal body weight) based on ASPEN guidelines for hypocaloric, high protein feeding in critically ill obese individuals  Current Nutrition: Clinimix-E 5/15 at 28ml/hr provides 1056 kcal and 60g protein  Admit:  Admitted 12/20 with abd pain with vomiting/fevers. (+)pneumoperitoneum 2nd SB rupture.  GI: S/P exp lap/oversew of perforated pre-pyloric gastric ulcer; omental patch 12/20. (+)Hx GERD. NGT with 34mL out and JP drain with 7mL out.Plan for UGI on at least POD#5 (today is POD#6) prior to feeding. 12/26 CT(+)pelvic fluid. PPI IV. Prealbumin 3.6 at baseline (low but noted recent surgery). Abd soft, distended.  Endo: Borderline DM.CBGs  30-126  Lytes: K 3.8,Phos 3.5,Mag 2.2  Renal: Cr stable, UOP 2.9 ml/kg/hr. I/O (-)2 L yesterday  Pulm: Hx COPD, chronic bronchitis. Acute resp failure, intubated - fio2 40%.   Cards:BP variable. HR 104.  Labetalol prn  AC: S/P ulnar artery embolectomy 12/20, on Heparin IV.  Hepatobil: AST mildly elevated, T bili 1.4. TG 222  Neuro: Hx MS, depression, periph neuropathy.Nicotine patch, Fent and Versed infusions. RASS -2 (goal -3).  ID:  Micafungin (12/23 >> ) for yeast in blood and peritoneal fluid. Tm 101.4, WBC 27.2    Considering change of line.  ID recs:  line holiday, No TPN to clear fungemia, & LOT 30day to start in future when able to give line holiday.  Best Practices: MC, SCDs TPN Access: R-IJ CVL TPN day#: 12/23 >>  Plan: Continue Clinimix  E 5/15 at 80ml/hr and 20% lipid emulsion on Mon, Wed, Fri to provide 1143 kcal and 66g protein. This will meet 100% of patients estimated needs. -remove insulin from TPN and cont ssi -f/u am labs.  Watch tbili and TGs  Thanks for allowing pharmacy to be a part of this patient's care.  Excell Seltzer, PharmD Clinical Pharmacist, [phone]

## 2014-10-05 NOTE — CV Procedure (Signed)
   PROCEDURE NOTE  Procedure:  Transesophageal echocardiogram Operator:  Fransico Him, MD Indications:  Endocarditis/peripheral emboli Complications: None IV Meds: Fentanyl 228mcg and Versed 4mg  IV  Results: Normal LV size and LVF Normal RV size and function Normal RA Normal LA and LAA Normal interatrial septum with no evidence of intracardiac shunt by colorflow doppler Normal TV with trivial TR Normal MV with mild MR Normal trileaflet AV with no AR Normal PV with trivial PR Normal thoracic and ascending aorta No evidence of endocarditis or source of embolism   The patient tolerated the procedure well with no complications  Signed: Fransico Him, MD [facility] 10/05/2014

## 2014-10-05 NOTE — Progress Notes (Signed)
ANTICOAGULATION CONSULT NOTE - Follow Up Consult  Pharmacy Consult for heparin Indication: arterial clot s/p thrombectomy  Allergies  Allergen Reactions  . Ambien [Zolpidem Tartrate] Other (See Comments)    Crazy dreams  . Ceftin [Cefuroxime Axetil] Nausea And Vomiting  . Hctz [Hydrochlorothiazide] Other (See Comments)    Urinary retention  . Hydrocodone Nausea Only  . Lodine [Etodolac] Hives  . Promethazine Other (See Comments)    Hallucinations.   . Roxicodone [Oxycodone Hcl] Swelling  . Codeine Rash  . Latex Rash  . Penicillins Rash    Patient Measurements: Height: 4\' 9"  (144.8 cm) Weight: 166 lb (75.297 kg) IBW/kg (Calculated) : 38.6 Heparin Dosing Weight: 70 kg  Vital Signs: Temp: 101.2 F (38.4 C) (12/28 0728) Temp Source: Oral (12/28 0728) BP: 133/70 mmHg (12/28 0800) Pulse Rate: 110 (12/28 0800)  Labs:  Recent Labs  10/03/14 0500  10/03/14 1645  10/04/14 0400 10/04/14 1330 10/04/14 1944 10/05/14 0346 10/05/14 0732  HGB 9.7*  --   --   --  10.1*  --   --  9.5*  --   HCT 29.8*  --   --   --  31.5*  --   --  30.6*  --   PLT 436*  --   --   --  588*  --   --  694*  --   LABPROT  --   --   --   --   --   --   --   --  15.8*  INR  --   --   --   --   --   --   --   --  1.25  HEPARINUNFRC 0.27*  < >  --   < > 0.24* 0.50 0.59 0.55  --   CREATININE 0.72  --  0.67  --  0.68  --   --  0.67  --   < > = values in this interval not displayed.  Estimated Creatinine Clearance: 71.6 mL/min (by C-G formula based on Cr of 0.67).   Medications:  Infusions:  . Marland KitchenTPN (CLINIMIX-E) Adult 55 mL/hr at 10/05/14 0801  . sodium chloride    . Marland KitchenTPN (CLINIMIX-E) Adult     And  . fat emulsion    . fentaNYL infusion INTRAVENOUS 50 mcg/hr (10/05/14 0826)  . heparin 2,100 Units/hr (10/05/14 0801)  . midazolam (VERSED) infusion 1 mg/hr (10/05/14 0826)    Assessment: 45 yoF with abdominal pain and N/V found to have a large perforated ulcer. 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 Of note, she underwent a thrombectomy on 12/20 and received intra-arterial TPA.  Heparin level is slightly supratherapeutic for lower heparin goal range (0.55) on 2100 units/hr. H/H remains low but relatively stable, plt trending up.   Goal of Therapy:  Heparin level 0.3-0.5 units/ml Monitor platelets by anticoagulation protocol: Yes   Plan:  - Decrease heparin drip to 2000 units/hr - Daily heparin level and CBC - Monitor for s/sx of bleeding  Sherlon Handing, PharmD, BCPS Clinical pharmacist, pager [phone] 10/05/2014 8:44 AM

## 2014-10-05 NOTE — Plan of Care (Signed)
TEE by Dr. Nigel Berthold mg versed and  200 mcg fentanyl (by drips) given for procedure. Pt placed back on full vent support and fi02 to 100%.

## 2014-10-05 NOTE — Progress Notes (Signed)
Recruitment maneuver done at this time.  SPO2 was 82% and with recruitment, SPO2 increased to 100%.

## 2014-10-05 NOTE — Progress Notes (Signed)
   VASCULAR SURGERY ASSESSMENT & PLAN:  * 8 Days Post-Op s/p: left brachial to radial artery bypass with left greater saphenous vein  *  Hand wounds remain stable.  SUBJECTIVE: arousable. On vent.  PHYSICAL EXAM: Filed Vitals:   10/05/14 0400 10/05/14 0408 10/05/14 0500 10/05/14 0600  BP: 125/69 140/61 135/70 126/104  Pulse: 110 106 98 104  Temp: 100.2 F (37.9 C)     TempSrc: Oral     Resp: 16 16 16 16   Height:      Weight:      SpO2: 94% 96% 99% 92%   Left hand warm and well-perfused. Moderate swelling. No change in blistering on Palm R aspect of hand and dry gangrene on tips of fingertips. Slight separation of vein harvest site in the left thigh.  LABS: Lab Results  Component Value Date   WBC 27.2* 10/05/2014   HGB 9.5* 10/05/2014   HCT 30.6* 10/05/2014   MCV 96.5 10/05/2014   PLT 694* 10/05/2014   Lab Results  Component Value Date   CREATININE 0.67 10/05/2014   No results found for: INR, PROTIME CBG (last 3)   Recent Labs  10/05/14 0343 10/05/14 0345 10/05/14 0347  GLUCAP 30* 51* 112*    Principal Problem:   Pneumoperitoneum Active Problems:   Sepsis   Altered mental state   Brachial artery occlusion   Acute respiratory failure with hypoxia   Severe sepsis   Perforated gastric ulcer   Acute respiratory failure   Artery occlusion   Blood poisoning   Candidemia   Screen for STD (sexually transmitted disease)   Abdominal abscess   Septic thrombophlebitis   Elevated WBCs   Fever   Gae Gallop Beeper: [phone] 10/05/2014

## 2014-10-05 NOTE — Interval H&P Note (Signed)
History and Physical Interval Note:  10/05/2014 9:13 AM  [name]  has presented today for surgery, with the diagnosis of Ischemic Arm  The various methods of treatment have been discussed with the patient and family. After consideration of risks, benefits and other options for treatment, the patient has consented to  Procedure(s): Left Radial, Brachial, and Ulnar Thrombectomy; Left Brachial to Radial Bypass Graft using Greater Saphenous vein graft from Left Thigh; Left Saphenous Vein Harvest; Intraoperative Arteriogram; Intra-arterial administration of TPA (Left) as a surgical intervention .  The patient's history has been reviewed, patient examined, no change in status, stable for surgery.  I have reviewed the patient's chart and labs.  Questions were answered to the patient's satisfaction.     Wilsie Kern R

## 2014-10-06 ENCOUNTER — Inpatient Hospital Stay (HOSPITAL_COMMUNITY): Payer: BLUE CROSS/BLUE SHIELD

## 2014-10-06 LAB — URINE MICROSCOPIC-ADD ON

## 2014-10-06 LAB — COMPREHENSIVE METABOLIC PANEL
ALT: 29 U/L (ref 0–35)
AST: 40 U/L — AB (ref 0–37)
Albumin: 1.7 g/dL — ABNORMAL LOW (ref 3.5–5.2)
Alkaline Phosphatase: 117 U/L (ref 39–117)
Anion gap: 11 (ref 5–15)
BUN: 8 mg/dL (ref 6–23)
CO2: 34 mmol/L — AB (ref 19–32)
Calcium: 8 mg/dL — ABNORMAL LOW (ref 8.4–10.5)
Chloride: 92 mEq/L — ABNORMAL LOW (ref 96–112)
Creatinine, Ser: 0.66 mg/dL (ref 0.50–1.10)
GFR calc Af Amer: >90 mL/min (ref >90)
GLUCOSE: 145 mg/dL — AB (ref 70–99)
Potassium: 3.7 mmol/L (ref 3.5–5.1)
SODIUM: 137 mmol/L (ref 135–145)
TOTAL PROTEIN: 6.5 g/dL (ref 6.0–8.3)
Total Bilirubin: 0.8 mg/dL (ref 0.3–1.2)

## 2014-10-06 LAB — GLUCOSE, CAPILLARY
GLUCOSE-CAPILLARY: 137 mg/dL — AB (ref 70–99)
GLUCOSE-CAPILLARY: 143 mg/dL — AB (ref 70–99)
GLUCOSE-CAPILLARY: 205 mg/dL — AB (ref 70–99)
Glucose-Capillary: 137 mg/dL — ABNORMAL HIGH (ref 70–99)
Glucose-Capillary: 168 mg/dL — ABNORMAL HIGH (ref 70–99)
Glucose-Capillary: 191 mg/dL — ABNORMAL HIGH (ref 70–99)
Glucose-Capillary: 199 mg/dL — ABNORMAL HIGH (ref 70–99)

## 2014-10-06 LAB — CBC
HEMATOCRIT: 31.7 % — AB (ref 36.0–46.0)
HEMOGLOBIN: 9.7 g/dL — AB (ref 12.0–15.0)
MCH: 30 pg (ref 26.0–34.0)
MCHC: 30.6 g/dL (ref 30.0–36.0)
MCV: 98.1 fL (ref 78.0–100.0)
Platelets: 732 10*3/uL — ABNORMAL HIGH (ref 150–400)
RBC: 3.23 MIL/uL — ABNORMAL LOW (ref 3.87–5.11)
RDW: 14 % (ref 11.5–15.5)
WBC: 25.1 10*3/uL — ABNORMAL HIGH (ref 4.0–10.5)

## 2014-10-06 LAB — URINALYSIS, ROUTINE W REFLEX MICROSCOPIC
BILIRUBIN URINE: NEGATIVE
GLUCOSE, UA: NEGATIVE mg/dL
KETONES UR: NEGATIVE mg/dL
LEUKOCYTES UA: NEGATIVE
Nitrite: NEGATIVE
PROTEIN: 100 mg/dL — AB
Specific Gravity, Urine: 1.015 (ref 1.005–1.030)
Urobilinogen, UA: 1 mg/dL (ref 0.0–1.0)
pH: 8 (ref 5.0–8.0)

## 2014-10-06 LAB — HEPARIN LEVEL (UNFRACTIONATED): Heparin Unfractionated: 0.14 IU/mL — ABNORMAL LOW (ref 0.30–0.70)

## 2014-10-06 MED ORDER — VITAL AF 1.2 CAL PO LIQD: 1000.0000 mL | ORAL | Status: DC

## 2014-10-06 MED ORDER — CLONAZEPAM 0.5 MG PO TABS
0.5000 mg | ORAL_TABLET | Freq: Once | ORAL | Status: AC
  Administered 2014-10-06: 0.5 mg via ORAL
  Filled 2014-10-06: qty 1

## 2014-10-06 MED ORDER — TRACE MINERALS CR-CU-F-FE-I-MN-MO-SE-ZN IV SOLN
INTRAVENOUS | Status: AC
  Administered 2014-10-06: 17:00:00 via INTRAVENOUS
  Filled 2014-10-06: qty 2000

## 2014-10-06 MED ORDER — FENTANYL CITRATE 0.05 MG/ML IJ SOLN
12.5000 ug | INTRAMUSCULAR | Status: DC | PRN
  Administered 2014-10-06: 25 ug via INTRAVENOUS

## 2014-10-06 MED ORDER — ENOXAPARIN SODIUM 80 MG/0.8ML ~~LOC~~ SOLN
70.0000 mg | Freq: Two times a day (BID) | SUBCUTANEOUS | Status: DC
  Administered 2014-10-06 – 2014-10-15 (×18): 70 mg via SUBCUTANEOUS
  Filled 2014-10-06 (×21): qty 0.8

## 2014-10-06 MED ORDER — CLONAZEPAM 1 MG PO TABS
1.0000 mg | ORAL_TABLET | Freq: Two times a day (BID) | ORAL | Status: DC
  Administered 2014-10-06 – 2014-10-07 (×3): 1 mg via ORAL
  Filled 2014-10-06 (×3): qty 1

## 2014-10-06 MED ORDER — POTASSIUM CHLORIDE 20 MEQ/15ML (10%) PO SOLN
40.0000 meq | Freq: Once | ORAL | Status: AC
  Administered 2014-10-06: 40 meq via ORAL
  Filled 2014-10-06: qty 30

## 2014-10-06 MED ORDER — VITAL HIGH PROTEIN PO LIQD
1000.0000 mL | ORAL | Status: DC
  Filled 2014-10-06: qty 1000

## 2014-10-06 MED ORDER — CLONAZEPAM 0.5 MG PO TABS
0.5000 mg | ORAL_TABLET | Freq: Two times a day (BID) | ORAL | Status: DC
  Administered 2014-10-06: 0.5 mg via ORAL
  Filled 2014-10-06: qty 1

## 2014-10-06 MED ORDER — VITAL AF 1.2 CAL PO LIQD
1000.0000 mL | ORAL | Status: DC
  Administered 2014-10-06 – 2014-10-07 (×2): 1000 mL
  Filled 2014-10-06 (×6): qty 1000

## 2014-10-06 MED ORDER — PIVOT 1.5 CAL PO LIQD: 1000.0000 mL | ORAL | Status: DC

## 2014-10-06 NOTE — Progress Notes (Signed)
PULMONARY / CRITICAL CARE MEDICINE HISTORY AND PHYSICAL EXAMINATION   Name: [name] MRN: [id number] DOB: 1965-11-04    ADMISSION DATE:  09/26/2014  PRIMARY SERVICE: PCCM  CHIEF COMPLAINT:  Abd pain  BRIEF PATIENT DESCRIPTION: 45yof smoker with PMH of MS, depression presents with abd pain, found to have pneumoperitoneum 2/2 small bowel rupture, also with new cold L hand.  Required surgery  for DU perf & left brachial bypass, fungemia noted on admission cultures  SIGNIFICANT EVENTS / STUDIES:  09/26/14: Admitted, CT Abd with pneumoperitoneum, cold L hand concerning for arterial clot as well. 12/20 > ex-lap [name]) with omental patch for perforated prepyloric gastric ulcer; L brachial and ulnar artery embolectomy ([place]) 12/20 LE doppler >>neg 12/20 >>Left brachial to left radial artery bypass with left leg greater saphenous vein for Recurrent occlusion  12/20 Self extubated - had to be reintubated 12/21 echo >>nml LVEF 12/26 CT abdomen >> pelvic fluid 12/28 TEE neg  LINES / TUBES: R IJ CVL 12/20 >> 12/29 LIJ 12/28 >> JP drain RUQ 12/20 >>  ETT 12/19 >>    SUBJECTIVE: Febrile RASS 0 , calmer on low dose versed gtt Good Uo with lasix  VITAL SIGNS: Temp:  [98.5 F (36.9 C)-101.8 F (38.8 C)] 98.5 F (36.9 C) (12/29 0304) Pulse Rate:  [79-123] 102 (12/29 0700) Resp:  [15-30] 16 (12/29 0700) BP: (89-156)/(47-120) 124/71 mmHg (12/29 0700) SpO2:  [94 %-100 %] 98 % (12/29 0700) FiO2 (%):  [40 %-100 %] 40 % (12/29 0700) Weight:  [68.539 kg (151 lb 1.6 oz)] 68.539 kg (151 lb 1.6 oz) (12/29 0400) HEMODYNAMICS:    Vent Mode:  [-] PRVC FiO2 (%):  [40 %-100 %] 40 % Set Rate:  [15 bmp] 15 bmp Vt Set:  [450 mL] 450 mL PEEP:  [5 cmH20] 5 cmH20 Plateau Pressure:  [17 cmH20-21 cmH20] 21 cmH20 INTAKE / OUTPUT: Intake/Output      12/28 0701 - 12/29 0700 12/29 0701 - 12/30 0700   I.V. (mL/kg) 1293.5 (18.9)    NG/GT 180    IV Piggyback 300    TPN 1460    Total  Intake(mL/kg) 3233.5 (47.2)    Urine (mL/kg/hr) 4540 (2.8)    Emesis/NG output     Drains 20 (0)    Total Output 4560     Net -1326.5            PHYSICAL EXAMINATION:  Gen: RASS +1 to -2 HEENT: WNL PULM: Clear anteriorly CV: Reg, no  AB: absent BS, surgical dressing Ext: warm, no edema Neuro: no focal deficits, follows commands  LABS: I have reviewed all of today's lab results. Relevant abnormalities are discussed in the A/P section  CXR: edema pattern, improved  ASSESSMENT / PLAN:  Principal Problem:   Pneumoperitoneum Active Problems:   Sepsis   Altered mental state   Brachial artery occlusion   Acute respiratory failure with hypoxia   Severe sepsis   Perforated gastric ulcer   Acute respiratory failure   Artery occlusion   Blood poisoning   Candidemia   Screen for STD (sexually transmitted disease)   Abdominal abscess   Septic thrombophlebitis   Elevated WBCs   Fever   Limb ischemia   HCAP (healthcare-associated pneumonia)   PULMONARY A: ETT 12/19 >>  Acute respiratory failure Pulm infiltrates -edema P:   Vent settings reviewed and/or adjusted Cont vent bundle Daily SBT if/when meets criteria with goal extubation     CARDIOVASCULAR A: Sinus tachycardia, reactive Hypertension, controlled  Multiple thromboemboli, TEE neg P:   Cont labetalol prn Dc heparin gtt, dc old IJ , then start lovenox    RENAL A: AKI, resolved Hypokalemia/hypophos P:   Monitor BMET intermittently Monitor I/Os Correct electrolytes as indicated  GASTROINTESTINAL A: Perforated gastric ulcer and pneumoperitoneum  Post laparotomy P:   Post op care per surgery Cont TPN, starting trickle TFs -advance as tolerated Cont SUP  HEMATOLOGIC A: mild thrombocytopenia -resolved, now high count P:   DVT px: full heparin Monitor CBC intermittently Transfuse per usual ICU guidelines  INFECTIOUS A: Peritonitis after bowel perforation Severe sepsis candidemia-unclear  source Persistent fever, rising WC P:   Blood cx: 09/26/14-->  NGTD 12/20 peritoneal fluid >> few yeast  Vanc: 12/19 >> 12/25 Zosyn:  12/19 >> 12/25 Micafungin 12/23 >>   ID following ,-doubt line sepsis since cx on admit growing yeast Pelvic fluid -IR  Drain once extubated  ENDOCRINE A:  Hypoglycemia, resolved Intermittent hyperglycemia P:   Cont SSI  NEUROLOGIC A: Post op pain Acute encephalopathy/ delerium P:   RASS goal 0 Cont PAD protocol , dc versed gtt -use intermittent Resume home SSRI and benzo   Social / Family:   TODAY'S SUMMARY: Wean to extubation    The patient is critically ill with multiple organ systems failure and requires high complexity decision making for assessment and support, frequent evaluation and titration of therapies, application of advanced monitoring technologies and extensive interpretation of multiple databases. Critical Care Time devoted to patient care services described in this note independent of APP time is 35 minutes.    [name] MD  10/06/2014, 7:53 AM

## 2014-10-06 NOTE — Progress Notes (Signed)
Pt continues to pull at tubes and trying to get OOB, will keep restrained for now.

## 2014-10-06 NOTE — Progress Notes (Addendum)
9 Days Post-Op  Subjective: Extubated about 10:30 and doing well  Objective: Vital signs in last 24 hours: Temp:  [98.2 F (36.8 C)-101.2 F (38.4 C)] 98.2 F (36.8 C) (12/29 0800) Pulse Rate:  [85-123] 118 (12/29 1100) Resp:  [15-29] 20 (12/29 1100) BP: (89-159)/(47-120) 142/81 mmHg (12/29 1100) SpO2:  [93 %-100 %] 97 % (12/29 1100) FiO2 (%):  [40 %-50 %] 40 % (12/29 0700) Weight:  [151 lb 1.6 oz (68.539 kg)] 151 lb 1.6 oz (68.539 kg) (12/29 0400) Last BM Date: 09/25/14  Intake/Output from previous day: 12/28 0701 - 12/29 0700 In: 3233.5 [I.V.:1293.5; NG/GT:180; IV Piggyback:300; TPN:1460] Out: 4560 [Urine:4540; Drains:20] Intake/Output this shift: Total I/O In: 430 [I.V.:90; NG/GT:80; TPN:260] Out: 1450 [Urine:1450]  General appearance: cooperative Resp: clear to auscultation bilaterally Cardio: tachy 120s GI: soft, wound clean, JP min output  Lab Results:   Recent Labs  10/05/14 0346 10/06/14 0420  WBC 27.2* 25.1*  HGB 9.5* 9.7*  HCT 30.6* 31.7*  PLT 694* 732*   BMET  Recent Labs  10/05/14 1700 10/06/14 0420  NA 139 137  K 3.2* 3.7  CL 94* 92*  CO2 37* 34*  GLUCOSE 186* 145*  BUN 7 8  CREATININE 0.70 0.66  CALCIUM 8.0* 8.0*   PT/INR  Recent Labs  10/05/14 0732  LABPROT 15.8*  INR 1.25   ABG  Recent Labs  10/06/14 0935  PHART 7.464*  HCO3 38.2*    Studies/Results: Dg Chest Port 1 View  10/06/2014   CLINICAL DATA:  Respiratory failure postoperative from laparotomy on September 27, 2014  EXAM: PORTABLE CHEST - 1 VIEW  COMPARISON:  Portable chest x-ray of October 05, 2014  FINDINGS: The lungs remain mildly hypoinflated. There is some improvement in the pulmonary interstitium bilaterally and in the confluent density at the left lung base. The left hemidiaphragm remains obscured. The cardiac silhouette is normal in size. The pulmonary vascularity is not engorged. There is no pneumothorax.  The endotracheal tube tip lies 3.6 cm above the  crotch of the carina. The esophagogastric tube tip bullet projects below the inferior margin of the image. The right internal jugular venous catheter tip and the left internal jugular venous catheter tip projected in the midportion of the SVC.  IMPRESSION: There is improved appearance of the pulmonary interstitium consistent with resolving interstitial edema. There is persistent left basilar atelectasis or pneumonia with small left pleural effusion. The support tubes and lines are in appropriate position radiographically.   Electronically Signed   By: David  Martinique   On: 10/06/2014 07:56   Dg Chest Port 1 View  10/05/2014   CLINICAL DATA:  Left IJ placement  EXAM: PORTABLE CHEST - 1 VIEW  COMPARISON:  09/27/2014  FINDINGS: Cardiomegaly with mild interstitial edema. Mild patchy lingular/ left lower lobe opacity, possibly atelectasis. Suspected small left pleural effusion. No pneumothorax.  Endotracheal tube terminates 3.5 cm above the carina.  Left IJ venous catheter terminates cavoatrial junction. Right IJ venous catheter terminates at the cavoatrial junction.  Enteric tube courses into the stomach.  IMPRESSION: Left IJ venous catheter terminates at the cavoatrial junction. No pneumothorax.  Cardiomegaly with mild interstitial edema.  Patchy lingular/ left lower lobe opacity, possibly atelectasis. Suspected small left pleural effusion.   Electronically Signed   By: Julian Hy M.D.   On: 10/05/2014 22:20   Dg Chest Port 1 View  10/05/2014   CLINICAL DATA:  Respiratory failure.  EXAM: PORTABLE CHEST - 1 VIEW  COMPARISON:  10/04/2014.  FINDINGS: Endotracheal tube, right IJ line, NG tube in stable position. Heart size stable. Diffuse bilateral pulmonary alveolar infiltrates are present. Subsegmental atelectasis left lung base. No pleural effusion or pneumothorax. No acute bony abnormality. Prior cervical spine fusion.  IMPRESSION: 1. Lines and tubes in stable position. 2. Persistent bilateral pulmonary  infiltrates. Left lower lobe subsegmental atelectasis.   Electronically Signed   By: Marcello Moores  Register   On: 10/05/2014 07:31    Anti-infectives: Anti-infectives    Start     Dose/Rate Route Frequency Ordered Stop   09/30/14 1300  micafungin (MYCAMINE) 100 mg in sodium chloride 0.9 % 100 mL IVPB    Comments:  Pharmacy may adjust as needed   100 mg100 mL/hr over 1 Hours Intravenous Every 24 hours 09/30/14 1213     09/27/14 2100  ceFEPIme (MAXIPIME) 2 g in dextrose 5 % 50 mL IVPB  Status:  Discontinued     2 g100 mL/hr over 30 Minutes Intravenous Every 24 hours 09/27/14 0603 09/27/14 1103   09/27/14 2000  vancomycin (VANCOCIN) IVPB 1000 mg/200 mL premix  Status:  Discontinued     1,000 mg200 mL/hr over 60 Minutes Intravenous Every 24 hours 09/27/14 0603 09/27/14 1103   09/27/14 1400  vancomycin (VANCOCIN) IVPB 1000 mg/200 mL premix  Status:  Discontinued     1,000 mg200 mL/hr over 60 Minutes Intravenous Every 12 hours 09/27/14 1103 10/02/14 1348   09/27/14 1200  ceFEPIme (MAXIPIME) 2 g in dextrose 5 % 50 mL IVPB  Status:  Discontinued     2 g100 mL/hr over 30 Minutes Intravenous Every 12 hours 09/27/14 1103 10/02/14 1348   09/26/14 1930  vancomycin (VANCOCIN) IVPB 1000 mg/200 mL premix     1,000 mg200 mL/hr over 60 Minutes Intravenous  Once 09/26/14 1917 09/26/14 2210   09/26/14 1930  ceFEPIme (MAXIPIME) 2 g in dextrose 5 % 50 mL IVPB     2 g100 mL/hr over 30 Minutes Intravenous  Once 09/26/14 1917 09/26/14 2132      Assessment/Plan: s/p Procedure(s): Left Radial, Brachial, and Ulnar Thrombectomy; Left Brachial to Radial Bypass Graft using Greater Saphenous vein graft from Left Thigh; Left Saphenous Vein Harvest; Intraoperative Arteriogram; Intra-arterial administration of TPA (Left) S/P repair perf pre-pyloric ulcer - resume TF at 1400 - goal VDRF - has tol extubation, per CCM Fungemia - appreciate ID assist, Micafungin WBC up - may be due to above, check U/A S/P thrombectomy LUE and L  brachial to radial bypass graft- Lovenox per VVS  LOS: 10 days    Zacchaeus Halm E 10/06/2014

## 2014-10-06 NOTE — Procedures (Signed)
Extubation Procedure Note  Patient Details:   Name: [name] DOB: 10-24-1965 MRN: [id number]   Airway Documentation:     Evaluation  O2 sats: stable throughout Complications: No apparent complications Patient did tolerate procedure well. Bilateral Breath Sounds: Rhonchi Suctioning: Airway Yes   Parameters on CPAP 5/PS5 40% NIF -32 cm h20, FVC 800cc, RR 22, no audible leak called Dr. Elsworth Soho for permission to extubate and he said extubation was fine to do.  Extubated to 2lpm [place], sats stayed around 90% increased to 3lpm [place] sats around 93%.  Patient able to state her name, and thought she was at [facility].  Good cough.  IS started, patient not keeping mouth closed on mouth piece when inspiring but closing it on exhalation.  She still has a noticeable blister on right corner of her mouth which may be inhibiting her.  Breaths are deep and good though.  Vaughan Basta [place] 10/06/2014, 10:30 AM

## 2014-10-06 NOTE — Progress Notes (Signed)
PARENTERAL NUTRITION CONSULT NOTE - FOLLOW UP  Pharmacy Consult for TPN Indication: S/P exp lap for perforated gastric ulcer  Allergies  Allergen Reactions  . Ambien [Zolpidem Tartrate] Other (See Comments)    Crazy dreams  . Ceftin [Cefuroxime Axetil] Nausea And Vomiting  . Hctz [Hydrochlorothiazide] Other (See Comments)    Urinary retention  . Hydrocodone Nausea Only  . Lodine [Etodolac] Hives  . Promethazine Other (See Comments)    Hallucinations.   . Roxicodone [Oxycodone Hcl] Swelling  . Codeine Rash  . Latex Rash  . Penicillins Rash    Patient Measurements: Height: 4\' 9"  (144.8 cm) Weight: 151 lb 1.6 oz (68.539 kg) IBW/kg (Calculated) : 38.6 Adjusted Body Weight:  Usual Weight:   Vital Signs: Temp: 98.5 F (36.9 C) (12/29 0304) Temp Source: Axillary (12/29 0304) BP: 124/71 mmHg (12/29 0700) Pulse Rate: 102 (12/29 0700) Intake/Output from previous day: 12/28 0701 - 12/29 0700 In: 3233.5 [I.V.:1293.5; NG/GT:180; IV Piggyback:300; TPN:1460] Out: 4560 [Urine:4540; Drains:20] Intake/Output from this shift:    Labs:  Recent Labs  10/04/14 0400 10/05/14 0346 10/05/14 0732 10/06/14 0420  WBC 32.4* 27.2*  --  25.1*  HGB 10.1* 9.5*  --  9.7*  HCT 31.5* 30.6*  --  31.7*  PLT 588* 694*  --  732*  INR  --   --  1.25  --      Recent Labs  10/04/14 0400 10/04/14 1944 10/05/14 0346 10/05/14 0500 10/05/14 1700 10/06/14 0420  NA 141  --  138 139 139 137  K 3.6 3.3* 3.8 4.1 3.2* 3.7  CL 99  --  94* 96 94* 92*  CO2 32  --  37* 37* 37* 34*  GLUCOSE 118*  --  115* 115* 186* 145*  BUN <5*  --  6 7 7 8   CREATININE 0.68  --  0.67 0.71 0.70 0.66  CALCIUM 8.0*  --  7.6* 7.6* 8.0* 8.0*  MG 1.5 2.5 2.2  --   --   --   PHOS 3.2  --  3.5  --   --   --   PROT  --   --  5.8*  --   --  6.5  ALBUMIN  --   --  1.5*  --   --  1.7*  AST  --   --  45*  --   --  40*  ALT  --   --  31  --   --  29  ALKPHOS  --   --  102  --   --  117  BILITOT  --   --  1.4*  --   --  0.8   PREALBUMIN  --   --  5.8*  --   --   --   TRIG  --   --  222*  --   --   --    Estimated Creatinine Clearance: 67.9 mL/min (by C-G formula based on Cr of 0.66).    Recent Labs  10/05/14 1922 10/05/14 2338 10/06/14 0300  GLUCAP 155* 137* 137*    Medications:  Scheduled:  . antiseptic oral rinse  7 mL Mouth Rinse QID  . chlorhexidine  15 mL Mouth Rinse BID  . furosemide  40 mg Intravenous BID  . insulin aspart  0-9 Units Subcutaneous 6 times per day  . micafungin (MYCAMINE) IV  100 mg Intravenous Q24H  . pantoprazole (PROTONIX) IV  40 mg Intravenous Daily    Insulin Requirements: 6 units Novolog  SSI/24hr  Nutritional Requirements: [phone] Kcal with 60-65g protein per RD recommendations (22-25 kcals/kg ideal body weight) based on ASPEN guidelines for hypocaloric, high protein feeding in critically ill obese individuals  Current Nutrition: Clinimix-E 5/15 at 110ml/hr provides 1056 kcal and 60g protein Vital HP @ 10 ml/hr  Admit:  Admitted 12/20 with abd pain with vomiting/fevers. (+)pneumoperitoneum 2nd SB rupture.  GI: S/P exp lap/oversew of perforated pre-pyloric gastric ulcer; omental patch 12/20. (+)Hx GERD.  12/26 CT(+)pelvic fluid. PPI IV. Prealbumin 3.6 at baseline (low but noted recent surgery). Prealb 12/28= 5.8  Have started Vital HP at 10 ml/hr  Endo: Borderline DM.Insulin removed from TPN due to hypoglycemia  CBGs <150  Lytes: K 3.7,Phos 3.5,Mag 2.2  Renal: Cr stable, UOP 2.47ml/kg/hr. I/O (-)1445 ml yesterday  Pulm: Hx COPD, chronic bronchitis. Acute resp failure, intubated - fio2 40%.   Cards:BP variable. HR 102.  Labetalol prn  AC: S/P ulnar artery embolectomy 12/20, on Heparin IV.  Hepatobil: AST mildly elevated, T bili 0.8 . TG 222  Neuro: Hx MS, depression, periph neuropathy. Fent and Versed infusions. RASS -1 (goal 0).  ID: Micafungin (12/23 >> ) for yeast in blood and peritoneal fluid. Tm 101.8, WBC 25.1   New CVC placed  12/28    ID recs:  line holiday, No TPN to clear fungemia, & LOT 30day to start in future when able to give line holiday.  Best Practices: MC, SCDs TPN Access: R-IJ CVL, L-IJ CVC 12/28 TPN day#: 12/23 >>  Plan: Decrease Clinimix  E 5/15 to 56ml/hr and 20% lipid emulsion on Mon, Wed, Fri. F/u ability to advance TFs -f/u am labs.   Thanks for allowing pharmacy to be a part of this patient's care.  Excell Seltzer, PharmD Clinical Pharmacist, [phone]

## 2014-10-06 NOTE — Progress Notes (Signed)
ANTICOAGULATION CONSULT NOTE - Follow Up Consult  Pharmacy Consult for Heparin ->Lovenox  Indication: Arterial clot s/p embolectomy   Patient Measurements: Height: 4\' 9"  (144.8 cm) Weight: 151 lb 1.6 oz (68.539 kg) IBW/kg (Calculated) : 38.6  Vital Signs: Temp: 98.5 F (36.9 C) (12/29 0304) Temp Source: Axillary (12/29 0304) BP: 141/71 mmHg (12/29 0800) Pulse Rate: 110 (12/29 0800)  Labs:  Recent Labs  10/04/14 0400  10/04/14 1944 10/05/14 0346 10/05/14 0500 10/05/14 0732 10/05/14 1700 10/06/14 0420  HGB 10.1*  --   --  9.5*  --   --   --  9.7*  HCT 31.5*  --   --  30.6*  --   --   --  31.7*  PLT 588*  --   --  694*  --   --   --  732*  LABPROT  --   --   --   --   --  15.8*  --   --   INR  --   --   --   --   --  1.25  --   --   HEPARINUNFRC 0.24*  < > 0.59 0.55  --   --   --  0.14*  CREATININE 0.68  --   --  0.67 0.71  --  0.70 0.66  < > = values in this interval not displayed.  Estimated Creatinine Clearance: 67.9 mL/min (by C-G formula based on Cr of 0.66).   Assessment: 48 y.o. 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 Hgb low but relatively stable, plts remain high. s/p thrombectomy 12/20 (received intra-arterial TPA). No bleeding noted. MD orders to change heparin to  Lovenox today.  Goal of Therapy:  Anti-Xa level 0.6-1 units/ml (4 hrs post dose) Monitor platelets by anticoagulation protocol: Yes   Plan:  - D/c heparin - Lovenox 70mg  SQ q12h - CBC q72h while on Lovenox - Monitor for s/sx of bleeding  Sherlon Handing, PharmD, BCPS Clinical pharmacist, pager [phone] 10/06/2014,8:16 AM

## 2014-10-06 NOTE — Plan of Care (Signed)
Problem: Phase II Progression Outcomes Goal: Date pt extubated/weaned off vent Outcome: Completed/Met Date Met:  10/06/14 10/06/14 Goal: Time pt extubated/weaned off vent Outcome: Completed/Met Date Met:  10/06/14 1015

## 2014-10-06 NOTE — Progress Notes (Signed)
ANTICOAGULATION CONSULT NOTE - Follow Up Consult  Pharmacy Consult for Heparin  Indication: Arterial clot s/p embolectomy   Patient Measurements: Height: 4\' 9"  (144.8 cm) Weight: 151 lb 1.6 oz (68.539 kg) IBW/kg (Calculated) : 38.6  Vital Signs: Temp: 98.5 F (36.9 C) (12/29 0304) Temp Source: Axillary (12/29 0304) BP: 156/79 mmHg (12/29 0422) Pulse Rate: 102 (12/29 0422)  Labs:  Recent Labs  10/04/14 0400  10/04/14 1944 10/05/14 0346 10/05/14 0500 10/05/14 0732 10/05/14 1700 10/06/14 0420  HGB 10.1*  --   --  9.5*  --   --   --  9.7*  HCT 31.5*  --   --  30.6*  --   --   --  31.7*  PLT 588*  --   --  694*  --   --   --  732*  LABPROT  --   --   --   --   --  15.8*  --   --   INR  --   --   --   --   --  1.25  --   --   HEPARINUNFRC 0.24*  < > 0.59 0.55  --   --   --  0.14*  CREATININE 0.68  --   --  0.67 0.71  --  0.70 0.66  < > = values in this interval not displayed.  Estimated Creatinine Clearance: 67.9 mL/min (by C-G formula based on Cr of 0.66).   Assessment: Sub-therapeutic heparin level  Goal of Therapy:  Heparin level 0.3-0.5 units/ml Monitor platelets by anticoagulation protocol: Yes   Plan:  -Increase heparin to 2050 units/hr (was supra-therapeutic on 2100 units/hr) -1300 HL -Daily CBC/HL -Monitor for bleeding  Narda Bonds 10/06/2014,5:21 AM

## 2014-10-06 NOTE — Progress Notes (Signed)
Wasted 200 cc fentanyl and 25 cc versed in sink witnessed by [employer]

## 2014-10-06 NOTE — Progress Notes (Signed)
NUTRITION FOLLOW-UP / CONSULT  DOCUMENTATION CODES Per approved criteria  -Obesity Unspecified   INTERVENTION:  Resume TF via NGT at 2 PM with Vital AF 1.2 at 20 ml/h, increase by 10 ml every 4 hours to goal rate of 50 ml/h to provide 1440 kcals, 90 gm protein, 973 ml free water daily.  Wean TPN per Pharmacy, adjust rate based on TF tolerance and advancement.   NUTRITION DIAGNOSIS: Inadequate oral intake related to inability to eat as evidenced by NPO s/p bowel surgery, peritonitis; ongoing.   Goal:  Enteral nutrition to provide 60-70% of estimated calorie needs (22-25 kcals/kg ideal body weight) and 100% of estimated protein needs, based on ASPEN guidelines for hypocaloric, high protein feeding in critically ill obese individuals, ongoing.  Monitor:  TF tolerance/adequacy, TPN prescription, weight trend, labs, vent status.  ASSESSMENT: Pt with h/o MS admitted with abdominal pain.  Patient found to have pneumoperitoneum 2/2 to small bowel rupture.  Also with brachial artery clot s/p surgery to repair both.   Patient was extubated this AM. TF of Vital High Protein was initiated yesterday at 10 ml/h, tolerated well per RN. Plans to resume TF at [facility] and advance to goal rate. Received MD Consult for TF initiation and management.  TPN: Clinimix E 5/15 decreasing to 50 ml/h today without lipids to provide 852 kcals and 60 gm protein.   Height: Ht Readings from Last 1 Encounters:  09/27/14 4\' 9"  (1.448 m)    Weight: Wt Readings from Last 1 Encounters:  10/06/14 151 lb 1.6 oz (68.539 kg)   10/05/14 166 lb (75.297 kg)   10/01/14 176 lb 5.9 oz (80 kg)    BMI:  Body mass index is 32.69 kg/(m^2).  Estimated Nutritional Needs: Kcal: 1400-1600 Protein: >/= 85 grams Fluid: 1.5 L/day  Skin: surgical incisions  Diet Order: TPN (CLINIMIX-E) Adult TPN (CLINIMIX-E) Adult Diet NPO time specified   Intake/Output Summary (Last 24 hours) at 10/06/14 1238 Last data filed at  10/06/14 1200  Gross per 24 hour  Intake 3207.5 ml  Output   4560 ml  Net -1352.5 ml    Last BM: 12/29  Labs:   Recent Labs Lab 10/03/14 0500  10/04/14 0400 10/04/14 1944 10/05/14 0346 10/05/14 0500 10/05/14 1700 10/06/14 0420  NA 140  < > 141  --  138 139 139 137  K 2.9*  < > 3.6 3.3* 3.8 4.1 3.2* 3.7  CL 108  < > 99  --  94* 96 94* 92*  CO2 27  < > 32  --  37* 37* 37* 34*  BUN <5*  < > <5*  --  6 7 7 8   CREATININE 0.72  < > 0.68  --  0.67 0.71 0.70 0.66  CALCIUM 7.7*  < > 8.0*  --  7.6* 7.6* 8.0* 8.0*  MG 1.8  --  1.5 2.5 2.2  --   --   --   PHOS 1.7*  --  3.2  --  3.5  --   --   --   GLUCOSE 138*  < > 118*  --  115* 115* 186* 145*  < > = values in this interval not displayed.  CBG (last 3)   Recent Labs  10/05/14 2338 10/06/14 0300 10/06/14 0828  GLUCAP 137* 137* 168*    Scheduled Meds: . antiseptic oral rinse  7 mL Mouth Rinse QID  . chlorhexidine  15 mL Mouth Rinse BID  . clonazePAM  1 mg Oral BID  .  enoxaparin (LOVENOX) injection  70 mg Subcutaneous Q12H  . furosemide  40 mg Intravenous BID  . insulin aspart  0-9 Units Subcutaneous 6 times per day  . micafungin [facility]) IV  100 mg Intravenous Q24H  . pantoprazole (PROTONIX) IV  40 mg Intravenous Daily    Continuous Infusions: . sodium chloride 20 mL/hr at 10/06/14 1200  . Marland KitchenTPN (CLINIMIX-E) Adult 55 mL/hr at 10/05/14 1728   And  . fat emulsion 240 mL (10/06/14 1200)  . feeding supplement (VITAL HIGH PROTEIN) Stopped (10/06/14 0751)  . feeding supplement (VITAL HIGH PROTEIN)    . Marland KitchenTPN (CLINIMIX-E) Adult      Molli Barrows, RD, LDN, [place] Pager [phone] After Hours Pager [phone]

## 2014-10-06 NOTE — Progress Notes (Signed)
   VASCULAR SURGERY ASSESSMENT & PLAN:  * 9 Days Post-Op s/p: left brachial to radial artery bypass with left greater saphenous vein  *  Continue local wound care to the left thigh wound and left hand.  SUBJECTIVE: On vent. Arousable.  PHYSICAL EXAM: Filed Vitals:   10/06/14 0700 10/06/14 0800 10/06/14 0900 10/06/14 1000  BP: 124/71 141/71 145/68 159/90  Pulse: 102 110 107 111  Temp:  98.2 F (36.8 C)    TempSrc:  Axillary    Resp: 16 16 29 23   Height:      Weight:      SpO2: 98% 95% 96% 97%   No change in left hand. No change in left thigh wound where there is some separation of the vein harvest site in the proximal thigh.  LABS: Lab Results  Component Value Date   WBC 25.1* 10/06/2014   HGB 9.7* 10/06/2014   HCT 31.7* 10/06/2014   MCV 98.1 10/06/2014   PLT 732* 10/06/2014   Lab Results  Component Value Date   CREATININE 0.66 10/06/2014   Lab Results  Component Value Date   INR 1.25 10/05/2014   CBG (last 3)   Recent Labs  10/05/14 2338 10/06/14 0300 10/06/14 0828  GLUCAP 137* 137* 168*    Principal Problem:   Pneumoperitoneum Active Problems:   Sepsis   Altered mental state   Brachial artery occlusion   Acute respiratory failure with hypoxia   Severe sepsis   Perforated gastric ulcer   Acute respiratory failure   Artery occlusion   Blood poisoning   Candidemia   Screen for STD (sexually transmitted disease)   Abdominal abscess   Septic thrombophlebitis   Elevated WBCs   Fever   Limb ischemia   HCAP (healthcare-associated pneumonia)   Gae Gallop Beeper: [phone] 10/06/2014

## 2014-10-07 ENCOUNTER — Inpatient Hospital Stay (HOSPITAL_COMMUNITY): Payer: BLUE CROSS/BLUE SHIELD

## 2014-10-07 LAB — GLUCOSE, CAPILLARY
GLUCOSE-CAPILLARY: 204 mg/dL — AB (ref 70–99)
Glucose-Capillary: 151 mg/dL — ABNORMAL HIGH (ref 70–99)
Glucose-Capillary: 154 mg/dL — ABNORMAL HIGH (ref 70–99)
Glucose-Capillary: 187 mg/dL — ABNORMAL HIGH (ref 70–99)
Glucose-Capillary: 188 mg/dL — ABNORMAL HIGH (ref 70–99)
Glucose-Capillary: 199 mg/dL — ABNORMAL HIGH (ref 70–99)
Glucose-Capillary: 30 mg/dL — CL (ref 70–99)

## 2014-10-07 LAB — BASIC METABOLIC PANEL
Anion gap: 11 (ref 5–15)
BUN: 11 mg/dL (ref 6–23)
CALCIUM: 8.5 mg/dL (ref 8.4–10.5)
CHLORIDE: 92 meq/L — AB (ref 96–112)
CO2: 36 mmol/L — ABNORMAL HIGH (ref 19–32)
CREATININE: 0.68 mg/dL (ref 0.50–1.10)
GFR calc Af Amer: >90 mL/min (ref >90)
GFR calc non Af Amer: >90 mL/min (ref >90)
GLUCOSE: 208 mg/dL — AB (ref 70–99)
Potassium: 3.6 mmol/L (ref 3.5–5.1)
Sodium: 139 mmol/L (ref 135–145)

## 2014-10-07 LAB — URINE CULTURE
Colony Count: NO GROWTH
Culture: NO GROWTH

## 2014-10-07 LAB — CBC
HCT: 33.2 % — ABNORMAL LOW (ref 36.0–46.0)
HEMOGLOBIN: 10.2 g/dL — AB (ref 12.0–15.0)
MCH: 30.4 pg (ref 26.0–34.0)
MCHC: 30.7 g/dL (ref 30.0–36.0)
MCV: 99.1 fL (ref 78.0–100.0)
Platelets: 764 10*3/uL — ABNORMAL HIGH (ref 150–400)
RBC: 3.35 MIL/uL — AB (ref 3.87–5.11)
RDW: 14 % (ref 11.5–15.5)
WBC: 20.9 10*3/uL — ABNORMAL HIGH (ref 4.0–10.5)

## 2014-10-07 LAB — PHOSPHORUS: Phosphorus: 4.3 mg/dL (ref 2.3–4.6)

## 2014-10-07 LAB — MAGNESIUM: Magnesium: 2.2 mg/dL (ref 1.5–2.5)

## 2014-10-07 MED ORDER — OXYCODONE HCL 5 MG PO TABS: 5.0000 mg | ORAL_TABLET | ORAL | Status: DC | PRN

## 2014-10-07 MED ORDER — HALOPERIDOL LACTATE 5 MG/ML IJ SOLN
1.0000 mg | INTRAMUSCULAR | Status: DC | PRN
  Administered 2014-10-08: 1 mg via INTRAVENOUS
  Administered 2014-10-08: 2.5 mg via INTRAVENOUS
  Filled 2014-10-07 (×2): qty 1

## 2014-10-07 MED ORDER — CITALOPRAM HYDROBROMIDE 20 MG PO TABS
20.0000 mg | ORAL_TABLET | Freq: Every day | ORAL | Status: DC
  Administered 2014-10-07: 20 mg via ORAL
  Filled 2014-10-07 (×2): qty 1

## 2014-10-07 MED ORDER — OXYCODONE-ACETAMINOPHEN 5-325 MG PO TABS: 1.0000 | ORAL_TABLET | ORAL | Status: DC | PRN

## 2014-10-07 MED ORDER — TRACE MINERALS CR-CU-F-FE-I-MN-MO-SE-ZN IV SOLN
INTRAVENOUS | Status: AC
  Administered 2014-10-07: 17:00:00 via INTRAVENOUS
  Filled 2014-10-07: qty 1000

## 2014-10-07 MED ORDER — POTASSIUM CHLORIDE 20 MEQ/15ML (10%) PO SOLN
40.0000 meq | Freq: Once | ORAL | Status: AC
  Administered 2014-10-07: 40 meq via ORAL
  Filled 2014-10-07 (×2): qty 30

## 2014-10-07 MED ORDER — GERHARDT'S BUTT CREAM
TOPICAL_CREAM | CUTANEOUS | Status: DC | PRN
  Administered 2014-10-08: 22:00:00 via TOPICAL
  Filled 2014-10-07 (×2): qty 1

## 2014-10-07 MED ORDER — OXYCODONE-ACETAMINOPHEN 10-325 MG PO TABS: 1.0000 | ORAL_TABLET | ORAL | Status: DC | PRN

## 2014-10-07 MED ORDER — FUROSEMIDE 10 MG/ML IJ SOLN
40.0000 mg | Freq: Every day | INTRAMUSCULAR | Status: DC
  Administered 2014-10-07: 40 mg via INTRAVENOUS
  Filled 2014-10-07: qty 4

## 2014-10-07 MED ORDER — PANTOPRAZOLE SODIUM 40 MG PO PACK
40.0000 mg | PACK | Freq: Every day | ORAL | Status: DC
Start: 2014-10-07 — End: 2014-10-18
  Administered 2014-10-07 – 2014-10-18 (×12): 40 mg
  Filled 2014-10-07 (×12): qty 20
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
## 2014-10-07 NOTE — Progress Notes (Signed)
PULMONARY / CRITICAL CARE MEDICINE HISTORY AND PHYSICAL EXAMINATION   Name: [name] MRN: [id number] DOB: 1965-11-17    ADMISSION DATE:  09/26/2014  PRIMARY SERVICE: PCCM  CHIEF COMPLAINT:  Abd pain  BRIEF PATIENT DESCRIPTION: 72yof smoker with PMH of MS, depression presents with abd pain, found to have pneumoperitoneum 2/2 small bowel rupture, also with new cold L hand.  Required surgery  for DU perf & left brachial bypass, fungemia noted on admission cultures  SIGNIFICANT EVENTS / STUDIES:  09/26/14: Admitted, CT Abd with pneumoperitoneum, cold L hand concerning for arterial clot as well. 12/20 > ex-lap [name]) with omental patch for perforated prepyloric gastric ulcer; L brachial and ulnar artery embolectomy ([place]) 12/20 LE doppler >>neg 12/20 >>Left brachial to left radial artery bypass with left leg greater saphenous vein for Recurrent occlusion  12/20 Self extubated - had to be reintubated 12/21 echo >>nml LVEF 12/26 CT abdomen >> pelvic fluid 12/28 TEE neg  LINES / TUBES: R IJ CVL 12/20 >> 12/29 LIJ 12/28 >> JP drain RUQ 12/20 >>  ETT 12/19 >> 12/29   SUBJECTIVE: afebrile RASS 0 , agitation overnight, on restraints Good Uo with lasix  VITAL SIGNS: Temp:  [97.7 F (36.5 C)-98.4 F (36.9 C)] 98.4 F (36.9 C) (12/30 0727) Pulse Rate:  [107-126] 108 (12/30 0700) Resp:  [13-32] 28 (12/30 0700) BP: (111-161)/(62-104) 129/72 mmHg (12/30 0700) SpO2:  [93 %-100 %] 100 % (12/30 0700) Weight:  [67.1 kg (147 lb 14.9 oz)] 67.1 kg (147 lb 14.9 oz) (12/30 0500) HEMODYNAMICS:      INTAKE / OUTPUT: Intake/Output      12/29 0701 - 12/30 0700 12/30 0701 - 12/31 0700   I.V. (mL/kg) 930 (13.9) 20 (0.3)   NG/GT 880 80   IV Piggyback 100    TPN 1350 50   Total Intake(mL/kg) 3260 (48.6) 150 (2.2)   Urine (mL/kg/hr) 4105 (2.5) 75 (0.7)   Drains 20 (0)    Stool 2 (0)    Total Output 4127 75   Net -867 +75        Stool Occurrence 3 x      PHYSICAL  EXAMINATION:  Gen: RASS +1 to -2 HEENT: WNL PULM: Clear anteriorly CV: Reg, no  AB: absent BS, surgical dressing Ext: warm, no edema Neuro: no focal deficits, follows commands, confused, oriented x 2  LABS: I have reviewed all of today's lab results. Relevant abnormalities are discussed in the A/P section  CXR: edema pattern, improved  ASSESSMENT / PLAN:  Principal Problem:   Pneumoperitoneum Active Problems:   Sepsis   Altered mental state   Brachial artery occlusion   Acute respiratory failure with hypoxia   Severe sepsis   Perforated gastric ulcer   Acute respiratory failure   Artery occlusion   Blood poisoning   Candidemia   Screen for STD (sexually transmitted disease)   Abdominal abscess   Septic thrombophlebitis   Elevated WBCs   Fever   Limb ischemia   HCAP (healthcare-associated pneumonia)   PULMONARY A: ETT 12/19 >>  Acute respiratory failure due to Pulm infiltrates -edema, resolved P:  mobilise IS    CARDIOVASCULAR A: Sinus tachycardia, reactive Hypertension, controlled  brachial artery occlusion, s/p bypass, TEE neg P:   Cont labetalol prn ct lovenox - coumadin vs NoAc eventually?   RENAL A: AKI, resolved Hypokalemia/hypophos P:   Monitor BMET intermittently Monitor I/Os Correct electrolytes as indicated Lasix daily while on TNA  GASTROINTESTINAL A: Perforated gastric ulcer and  pneumoperitoneum  Post laparotomy P:   Post op care per surgery Cont TPN, TFs at goal, dc TNA in24 h Cont SUP  HEMATOLOGIC A: mild thrombocytopenia -resolved, now high count P:   DVT px: full heparin Monitor CBC intermittently Transfuse per usual ICU guidelines  INFECTIOUS A: Peritonitis after bowel perforation Severe sepsis candidemia-unclear source Persistent fever, rising WC P:   Blood cx: 09/26/14-->  NGTD 12/20 peritoneal fluid >> few yeast  Vanc: 12/19 >> 12/25 Zosyn:  12/19 >> 12/25 Micafungin 12/23 >>   ID following ,-doubt  line sepsis since cx on admit growing yeast Pelvic fluid -? Need for IR  Drain now that [place] decreasing  ENDOCRINE A:  Hypoglycemia, resolved Intermittent hyperglycemia P:   Cont SSI  NEUROLOGIC A: Post op pain Acute encephalopathy/ delerium P:   RASS goal 0 Resume home SSRI and  Clonazepam Haldol prn   Social / Family:   TODAY'S SUMMARY: Extubated, now mobilise, delerium needs to resolve, may be a good LTAC candidate if all services in agreement   The patient is critically ill with multiple organ systems failure and requires high complexity decision making for assessment and support, frequent evaluation and titration of therapies, application of advanced monitoring technologies and extensive interpretation of multiple databases. Critical Care Time devoted to patient care services described in this note independent of APP time is 35 minutes.    [name] MD  10/07/2014, 8:43 AM

## 2014-10-07 NOTE — Progress Notes (Signed)
Orthopedic Tech Progress Note Patient Details:  [name] 1966-05-21 [id number]  Ortho Devices Type of Ortho Device: Postop shoe/boot Ortho Device/Splint Location: prafo boot LLE Ortho Device/Splint Interventions: Ordered, Application   Braulio Bosch 10/07/2014, 8:24 PM

## 2014-10-07 NOTE — Evaluation (Signed)
Clinical/Bedside Swallow Evaluation Patient Details  Name: [name] MRN: [id number] Date of Birth: [birth year]  Today's Date: 10/07/2014 Time: 0953-1010 SLP Time Calculation (min) (ACUTE ONLY): 17 min  Past Medical History:  Past Medical History  Diagnosis Date  . Anxiety   . Depression   . COPD (chronic obstructive pulmonary disease)   . MS (multiple sclerosis)   . Fibromyalgia   . Impaired fasting glucose   . 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 Eating disorder   . Chronic low back pain   . PONV (postoperative nausea and vomiting)   . Chronic bronchitis     "yearly" (02/11/2013)  . Borderline diabetes   . GERD (gastroesophageal reflux disease)   . Migraine     "I use to" (02/11/2013)  . Arthritis     "hands & feet" (02/11/2013)  . Peripheral neuropathy     [name] 02/11/2013  . B12 deficiency anemia     [name] 02/11/2013  . Chronic pain syndrome     [name] 02/11/2013  . UTI (lower urinary tract infection) 02/11/2013    [name] 02/11/2013  . Chronic edema     BLE/notes 02/11/2013  . Ataxia   . Peripheral neuropathy   . Muscle weakness of lower extremity     bilateral   Past Surgical History:  Past Surgical History  Procedure Laterality Date  . Abdominal hysterectomy  ~ 1987; ~ 2004    "[place]; [place]" (02/11/2013)  . Cesarean section  8299; 1984; 1986  . Anterior cervical decomp/discectomy fusion  2009; 2011  . Tonsillectomy  1990's?  . Cataract extraction w/phaco  06/20/2011    Procedure: CATARACT EXTRACTION PHACO AND INTRAOCULAR LENS PLACEMENT (IOC);  Surgeon: [name] T. Gershon Crane;  Location: [facility];  Service: Ophthalmology;  Laterality: Left;  CDE 1.81  . Cataract extraction w/phaco  07/04/2011    Procedure: CATARACT EXTRACTION PHACO AND INTRAOCULAR LENS PLACEMENT (IOC);  Surgeon: [name] T. Gershon Crane;  Location: [facility];  Service: Ophthalmology;  Laterality: Right;  CDE: 1.76  . Yag laser application Right 3/71/6967    Procedure: YAG LASER  APPLICATION;  Surgeon: [name] T. Gershon Crane, MD;  Location: [facility];  Service: Ophthalmology;  Laterality: Right;  . Yag laser application Left 8/93/8101    Procedure: YAG LASER APPLICATION;  Surgeon: [name] T. Gershon Crane, MD;  Location: [facility];  Service: Ophthalmology;  Laterality: Left;  . Appendectomy    . Laparotomy N/A 09/27/2014    Procedure: Exploratory Laparotomy, Biopsy of Perforated Gastric Ulcer, Closure with omental patch;  Surgeon: [name], MD;  Location: [place];  Service: General;  Laterality: N/A;  . Embolectomy Left 09/27/2014    Procedure: Left Brachial, Radial,Ulnar Embolectomy with patch angioplasty left brachial, radial and ulnar artery;  Surgeon: [name], MD;  Location: [place] [facility];  Service: Vascular;  Laterality: Left;  . Embolectomy Left 09/27/2014    Procedure: Left Radial, Brachial, and Ulnar Thrombectomy; Left Brachial to Radial Bypass Graft using Greater Saphenous vein graft from Left Thigh; Left Saphenous Vein Harvest; Intraoperative Arteriogram; Intra-arterial administration of TPA;  Surgeon: [name], MD;  Location: [facility];  Service: Vascular;  Laterality: Left;   HPI:  48 yo f smoker with PMH of MS, depression presents with abd pain, found to have pneumoperitoneum 2/2 small bowel rupture from perforated gastric ulcer, also with new cold L hand. Required surgery for DU perf & left brachial bypass, fungemia noted on admission cultures. Intubated from 12/19 to 12/29 with one episode of self  extubation.    Assessment / Plan / Recommendation Clinical Impression  Pt demonstrates significant generalized weakness, confusion and evidence of upper airway edema following prolonged intubation. These factors result in what is likely to be an acute reversible dysphagia. Recommend pt remain dependent on NG/TNA until vocal quality and subjective swallow ability improve. Will consider FEES to objectively evaluate swallow as pt progresses.     Aspiration Risk  Severe    Diet  Recommendation NPO;Alternative means - temporary        Other  Recommendations Recommended Consults: FEES Oral Care Recommendations: Oral care Q4 per protocol   Follow Up Recommendations       Frequency and Duration min 2x/week  2 weeks   Pertinent Vitals/Pain NA    SLP Swallow Goals     Swallow Study Prior Functional Status       General HPI: 48 yo f smoker with PMH of MS, depression presents with abd pain, found to have pneumoperitoneum 2/2 small bowel rupture from perforated gastric ulcer, also with new cold L hand. Required surgery for DU perf & left brachial bypass, fungemia noted on admission cultures. Intubated from 12/19 to 12/29 with one episode of self extubation.  Type of Study: Bedside swallow evaluation Previous Swallow Assessment: none Diet Prior to this Study: TNA;Panda Temperature Spikes Noted: No Respiratory Status: Nasal cannula History of Recent Intubation: Yes Length of Intubations (days): 10 days Date extubated: 10/07/14 Behavior/Cognition: Alert;Confused Oral Cavity - Dentition: Poor condition Self-Feeding Abilities: Total assist Patient Positioning: Upright in bed Baseline Vocal Quality: Hoarse Volitional Cough: Weak Volitional Swallow: Unable to elicit    Oral/Motor/Sensory Function Overall Oral Motor/Sensory Function: Impaired (generalized weakness)   Ice Chips Ice chips: Impaired Presentation: Spoon Pharyngeal Phase Impairments: Decreased hyoid-laryngeal movement;Wet Vocal Quality   Thin Liquid Thin Liquid: Not tested    Nectar Thick Nectar Thick Liquid: Not tested   Honey Thick Honey Thick Liquid: Not tested   Puree Puree: Not tested   Solid   GO           Herbie Baltimore, MA CCC-SLP [phone]  Zenith Lamphier, Katherene Ponto 10/07/2014,10:17 AM

## 2014-10-07 NOTE — Care Management Note (Addendum)
Page 1 of 2   10/18/2014     8:14:33 AM CARE MANAGEMENT NOTE 10/18/2014  Patient:  [name], [name]   Account Number:  [id number]  Date Initiated:  09/30/2014  Documentation initiated by:  MAYO,HENRIETTA  Subjective/Objective Assessment:   transferred from [facility] with dx of pneumoperitoneum/ischemic (L) hand     Action/Plan:   Anticipated DC Date:  10/16/2014   Anticipated DC Plan:  LONG TERM ACUTE CARE (LTAC)      DC Planning Services  CM consult      Choice offered to / List presented to:             Status of service:  Completed, signed off Medicare Important Message given?  YES (If response is "NO", the following Medicare IM given date fields will be blank) Date Medicare IM given:  10/15/2014 Medicare IM given by:  [facility] Date Additional Medicare IM given:   Additional Medicare IM given by:    Discharge Disposition:    Per UR Regulation:  Reviewed for med. necessity/level of care/duration of stay  If discussed at [place] of Stay Meetings, dates discussed:   10/06/2014  10/13/2014  10/15/2014    Comments:  10/17/14 10:00 CM received call from [place] stating bved available only through sunday 1/10 and then insurance authorization will have to begin agin on Monday to secure another be.  RN made aware.  MD made aware (via note).  Will continue to monitor for stability of transfer. [name], BSN, CM [phone].  Contact:  [name],Mark Significant other [place]    FAATIMAH, SPIELBERG Son  [phone]                  Erlene Quan - son - [phone]  10/16/13 0840 Babette Relic RN MSN BSN CCM Notified by [employer] that insurance has authorized transfer - informed attending and RN caring for pt.  Per attending, surgery has not signed off and pt not appropriate for transfer.  10-15-14  1:30pm Luz Lex, RNBSN - [phone] [facility] sending clinicals for insurance approval.  Left message for son. Daughter in law [name] called back.   Updated.  Discussed Long term acute care units.  Verified that her insurance primary is BCBS which is in network for [facility].  She did have her flu shot in October of 2015.  Prior to admission was at home with SO and mostly independent.  She does have a walker, but doesnt use now much.  Occasionally needs assistance due let pain, but mostly independent.  10-14-14   11:15am Luz Lex, RNBSN [phone] Plan for trach this afternoon.  Ltach referral placed.  In network at [facility].  [facility] notified.  Physician states probably not ready tomorrow.  Will follow and talk with son tomorrow.  10-13-14 10:45am [place] [phone] Remains intubated - off pressors - looking at trach the end of the week.  CM will continue to follow.   Will continue with Ltach plan - [facility] - that is in her insurance network.  Intubated - sedated - unable to talk.  10/08/14 [place] RN MSN BSN CCM Pt with O2 sat of 72% on 4L/min [place], HR 120s-130s, placed on BiPAP. 1330 Respiratory arrest 2 minutes, requiring CPR and epinephrine 1 with prompt return of pulse and circulation. Pt intubated.  10/07/14 [place] MSN BSN CCM Pt has LTAC benefit and [facility] is in network.  Discussed potential plan  with SO, [name] [phone]), who agrees.  When pt is medically stable for transfer, [facility] will request authorization from [facility].

## 2014-10-07 NOTE — Progress Notes (Signed)
PARENTERAL NUTRITION CONSULT NOTE - FOLLOW UP  Pharmacy Consult for TPN Indication: S/P exp lap for perforated gastric ulcer  Allergies  Allergen Reactions  . Ambien [Zolpidem Tartrate] Other (See Comments)    Crazy dreams  . Ceftin [Cefuroxime Axetil] Nausea And Vomiting  . Hctz [Hydrochlorothiazide] Other (See Comments)    Urinary retention  . Hydrocodone Nausea Only  . Lodine [Etodolac] Hives  . Promethazine Other (See Comments)    Hallucinations.   . Roxicodone [Oxycodone Hcl] Swelling  . Codeine Rash  . Latex Rash  . Penicillins Rash    Patient Measurements: Height: 4\' 9"  (144.8 cm) Weight: 147 lb 14.9 oz (67.1 kg) IBW/kg (Calculated) : 38.6 Adjusted Body Weight:  Usual Weight:   Vital Signs: Temp: 98.4 F (36.9 C) (12/30 0727) Temp Source: Oral (12/30 0727) BP: 123/73 mmHg (12/30 0900) Pulse Rate: 107 (12/30 0900) Intake/Output from previous day: 12/29 0701 - 12/30 0700 In: 7169 [I.V.:930; NG/GT:880; IV Piggyback:100; TPN:1350] Out: 6789 [Urine:4105; Drains:20; Stool:2] Intake/Output from this shift: Total I/O In: 350 [I.V.:40; NG/GT:210; TPN:100] Out: 120 [Urine:120]  Labs:  Recent Labs  10/05/14 0346 10/05/14 0732 10/06/14 0420 10/07/14 0500  WBC 27.2*  --  25.1* 20.9*  HGB 9.5*  --  9.7* 10.2*  HCT 30.6*  --  31.7* 33.2*  PLT 694*  --  732* 764*  INR  --  1.25  --   --      Recent Labs  10/04/14 1944 10/05/14 0346  10/05/14 1700 10/06/14 0420 10/07/14 0500  NA  --  138  < > 139 137 139  K 3.3* 3.8  < > 3.2* 3.7 3.6  CL  --  94*  < > 94* 92* 92*  CO2  --  37*  < > 37* 34* 36*  GLUCOSE  --  115*  < > 186* 145* 208*  BUN  --  6  < > 7 8 11   CREATININE  --  0.67  < > 0.70 0.66 0.68  CALCIUM  --  7.6*  < > 8.0* 8.0* 8.5  MG 2.5 2.2  --   --   --  2.2  PHOS  --  3.5  --   --   --  4.3  PROT  --  5.8*  --   --  6.5  --   ALBUMIN  --  1.5*  --   --  1.7*  --   AST  --  45*  --   --  40*  --   ALT  --  31  --   --  29  --   ALKPHOS  --   102  --   --  117  --   BILITOT  --  1.4*  --   --  0.8  --   PREALBUMIN  --  5.8*  --   --   --   --   TRIG  --  222*  --   --   --   --   < > = values in this interval not displayed. Estimated Creatinine Clearance: 67.1 mL/min (by C-G formula based on Cr of 0.68).    Recent Labs  10/06/14 2315 10/07/14 0352 10/07/14 0723  GLUCAP 143* 188* 151*    Medications:  Scheduled:  . citalopram  20 mg Oral Q1200  . clonazePAM  1 mg Oral BID  . enoxaparin (LOVENOX) injection  70 mg Subcutaneous Q12H  . [START ON 10/08/2014] furosemide  40 mg Intravenous  Daily  . insulin aspart  0-9 Units Subcutaneous 6 times per day  . micafungin (MYCAMINE) IV  100 mg Intravenous Q24H  . pantoprazole sodium  40 mg Per Tube Q1200    Insulin Requirements: 14 units Novolog SSI/24hr  Nutritional Requirements: [phone] Kcal with 60-65g protein per RD recommendations (22-25 kcals/kg ideal body weight) based on ASPEN guidelines for hypocaloric, high protein feeding in critically ill obese individuals  Current Nutrition: Clinimix-E 5/15 at 70ml/hr  Vital HP @ 50 ml/hr>>goal rate  Admit:  Admitted 12/20 with abd pain with vomiting/fevers. (+)pneumoperitoneum 2nd SB rupture.  GI: S/P exp lap/oversew of perforated pre-pyloric gastric ulcer; omental patch 12/20. (+)Hx GERD.  12/26 CT(+)pelvic fluid. PPI IV. Prealbumin 3.6 at baseline (low but noted recent surgery). Prealb 12/28= 5.8   Vital HP at 50 ml/hr>>goal rate   Plan to dc TPN in 24 hours  Endo: Borderline DM.Insulin removed from TPN due to hypoglycemia  CBGs increased with addition of TFs  Lytes: K 3.6,Phos 4.3,Mag 2.2  Renal: Cr stable, UOP 2.76ml/kg/hr. I/O (-)868 ml yesterday  Pulm: Hx COPD, chronic bronchitis. Acute resp failure, now extubated  Cards:BP ok. HR 107.  Labetalol prn  AC: S/P ulnar artery embolectomy 12/20, on Heparin IV.  Hepatobil: AST mildly elevated, T bili 0.8 . TG 222  Neuro: Hx MS, depression, periph  neuropathy.  ID: Micafungin (12/23 >> ) for yeast in blood and peritoneal fluid. Tm 101.8, WBC 20.9   New CVC placed 12/28    ID recs:  line holiday, No TPN to clear fungemia, & LOT 30day to start in future when able to give line holiday.  Best Practices: SCDs TPN Access: R-IJ CVL, L-IJ CVC 12/28 TPN day#: 12/23 >>  Plan: Decrease Clinimix  E 5/15 to 10ml/hr D/c TPN 12/31  Thanks for allowing pharmacy to be a part of this patient's care.  Excell Seltzer, PharmD Clinical Pharmacist, [phone]

## 2014-10-07 NOTE — Progress Notes (Signed)
PT Cancellation Note  Patient Details Name: [name] MRN: [id number] DOB: [birth year]   Cancelled Treatment:    Reason Eval/Treat Not Completed: Patient not medically ready Patient currently with strict bed rest orders. Ordering physician unavailable via [place]. Requested activity update through physician sticky note when patient is medically ready. Will check back this afternoon for PT evaluation.   [place], [place]   Ellouise Newer 10/07/2014, 8:11 AM

## 2014-10-07 NOTE — Progress Notes (Signed)
   VASCULAR SURGERY ASSESSMENT & PLAN:  * 10 Days Post-Op s/p: left brachial to radial artery bypass with left greater saphenous vein  * Continue local wound care to the left thigh wound and left hand.   SUBJECTIVE: Off vent. No specific complaints  PHYSICAL EXAM: Filed Vitals:   10/07/14 0400 10/07/14 0418 10/07/14 0500 10/07/14 0600  BP: 126/84  142/62 126/71  Pulse: 110  108 112  Temp:  98.1 F (36.7 C)    TempSrc:  Oral    Resp: 13  21 18   Height:      Weight:   147 lb 14.9 oz (67.1 kg)   SpO2: 100%  99% 100%   Palpable graft pulse Hand unchanged.   LABS: Lab Results  Component Value Date   WBC 20.9* 10/07/2014   HGB 10.2* 10/07/2014   HCT 33.2* 10/07/2014   MCV 99.1 10/07/2014   PLT 764* 10/07/2014   Lab Results  Component Value Date   CREATININE 0.68 10/07/2014   Lab Results  Component Value Date   INR 1.25 10/05/2014   CBG (last 3)   Recent Labs  10/06/14 1934 10/06/14 2315 10/07/14 0352  GLUCAP 199* 143* 188*    Principal Problem:   Pneumoperitoneum Active Problems:   Sepsis   Altered mental state   Brachial artery occlusion   Acute respiratory failure with hypoxia   Severe sepsis   Perforated gastric ulcer   Acute respiratory failure   Artery occlusion   Blood poisoning   Candidemia   Screen for STD (sexually transmitted disease)   Abdominal abscess   Septic thrombophlebitis   Elevated WBCs   Fever   Limb ischemia   HCAP (healthcare-associated pneumonia)    Gae Gallop Beeper: [phone] 10/07/2014

## 2014-10-07 NOTE — Progress Notes (Signed)
[facility] for Infectious Disease    Date of Admission:  09/26/2014   Total days of antibiotics 12        Day 7 micafungin                   ID: LACHERYL NIESEN is a 48 y.o. 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 Left brachial to left radial artery bypass with left leg non-reversed greater saphenous vein. Intra-arterial injection of TPA. Hospitalization also complicated by HCAP, VDRF s/p extubation on 12/29. She remains afebrile since 12/28. Cleared fungemia on 12/24  Principal Problem:   Pneumoperitoneum Active Problems:   Sepsis   Altered mental state   Brachial artery occlusion   Acute respiratory failure with hypoxia   Severe sepsis   Perforated gastric ulcer   Acute respiratory failure   Artery occlusion   Blood poisoning   Candidemia   Screen for STD (sexually transmitted disease)   Abdominal abscess   Septic thrombophlebitis   Elevated WBCs   Fever   Limb ischemia   HCAP (healthcare-associated pneumonia)    Subjective: Afebrile since evening of 12/28, wbc trending down to 20K  Medications:  . citalopram  20 mg Oral Q1200  . clonazePAM  1 mg Oral BID  . enoxaparin (LOVENOX) injection  70 mg Subcutaneous Q12H  . [START ON 10/08/2014] furosemide  40 mg Intravenous Daily  . insulin aspart  0-9 Units Subcutaneous 6 times per day  . micafungin (MYCAMINE) IV  100 mg Intravenous Q24H  . pantoprazole sodium  40 mg Per Tube Q1200    Objective: Vital signs in last 24 hours: Temp:  [97.7 F (36.5 C)-98.4 F (36.9 C)] 98.4 F (36.9 C) (12/30 0727) Pulse Rate:  [107-126] 107 (12/30 0900) Resp:   [13-32] 18 (12/30 0900) BP: (111-161)/(62-104) 123/73 mmHg (12/30 0900) SpO2:  [93 %-100 %] 99 % (12/30 0900) Weight:  [147 lb 14.9 oz (67.1 kg)] 147 lb 14.9 oz (67.1 kg) (12/30 0500) Physical Exam  Constitutional:  oriented to person, place,only. appears well-nourished. No distress. Sitting up in chair HENT: ng in place, Mouth/Throat: Oropharynx is clear and moist. No oropharyngeal exudate. Left IJ in place Cardiovascular: Normal rate, regular rhythm and normal heart sounds. Exam reveals no gallop and no friction rub.  No murmur heard.  Pulmonary/Chest: Effort normal and breath sounds normal. No respiratory distress.  has no wheezes.  Abdominal: Soft. Bowel sounds are decreased. Abdominal bandage in place. Skin: Skin is warm and dry. No rash noted. No erythema. Skin incision is healing from vascular surger   Lab Results  Recent Labs  10/06/14 0420 10/07/14 0500  WBC 25.1* 20.9*  HGB 9.7* 10.2*  HCT 31.7* 33.2*  NA 137 139  K 3.7 3.6  CL 92* 92*  CO2 34* 36*  BUN 8 11  CREATININE 0.66 0.68   Liver Panel  Recent Labs  10/05/14 0346 10/06/14 0420  PROT 5.8* 6.5  ALBUMIN 1.5* 1.7*  AST 45* 40*  ALT 31 29  ALKPHOS 102 117  BILITOT 1.4* 0.8   Sedimentation Rate No results for input(s): ESRSEDRATE in the last 72 hours. C-Reactive Protein No results for input(s): CRP in the  last 72 hours.  Microbiology: 12/24 blood cx ngtd 12/19 blood cx c.glabrata 12/20 peritoneal fluid few c.glabrata Studies/Results: Dg Chest Port 1 View  10/07/2014   CLINICAL DATA:  Respiratory failure.  EXAM: PORTABLE CHEST - 1 VIEW  COMPARISON:  12/15/2013.  FINDINGS: Interim extubation. Left IJ line NG tube in stable position. Persistent bibasilar atelectasis. Heart size normal. No pleural effusion or pneumothorax. Prior cervical spine fusion.  IMPRESSION: 1. Interim extubation left IJ line and NG tube in stable position. 2. Persistent bibasilar atelectasis.   Electronically Signed   By: Marcello Moores   Register   On: 10/07/2014 07:48   Dg Chest Port 1 View  10/06/2014   CLINICAL DATA:  Respiratory failure postoperative from laparotomy on September 27, 2014  EXAM: PORTABLE CHEST - 1 VIEW  COMPARISON:  Portable chest x-ray of October 05, 2014  FINDINGS: The lungs remain mildly hypoinflated. There is some improvement in the pulmonary interstitium bilaterally and in the confluent density at the left lung base. The left hemidiaphragm remains obscured. The cardiac silhouette is normal in size. The pulmonary vascularity is not engorged. There is no pneumothorax.  The endotracheal tube tip lies 3.6 cm above the crotch of the carina. The esophagogastric tube tip bullet projects below the inferior margin of the image. The right internal jugular venous catheter tip and the left internal jugular venous catheter tip projected in the midportion of the SVC.  IMPRESSION: There is improved appearance of the pulmonary interstitium consistent with resolving interstitial edema. There is persistent left basilar atelectasis or pneumonia with small left pleural effusion. The support tubes and lines are in appropriate position radiographically.   Electronically Signed   By: David  Martinique   On: 10/06/2014 07:56   Dg Chest Port 1 View  10/05/2014   CLINICAL DATA:  Left IJ placement  EXAM: PORTABLE CHEST - 1 VIEW  COMPARISON:  09/27/2014  FINDINGS: Cardiomegaly with mild interstitial edema. Mild patchy lingular/ left lower lobe opacity, possibly atelectasis. Suspected small left pleural effusion. No pneumothorax.  Endotracheal tube terminates 3.5 cm above the carina.  Left IJ venous catheter terminates cavoatrial junction. Right IJ venous catheter terminates at the cavoatrial junction.  Enteric tube courses into the stomach.  IMPRESSION: Left IJ venous catheter terminates at the cavoatrial junction. No pneumothorax.  Cardiomegaly with mild interstitial edema.  Patchy lingular/ left lower lobe opacity, possibly atelectasis. Suspected  small left pleural effusion.   Electronically Signed   By: Julian Hy M.D.   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 Would treat with micafungin since c.glabrata can have fluconazole resistance. Currently on day 7 of 28. Micro lab quoted that timing to get sensitivities back on c.glabrata isolate would be 2-3 wk thus would not return in time to impact decision making.  Once delirium improved, recommend to do visual eye exam, to see if candidal involvement  As she continues to improve,recommend to change out IJ to picc line.  Intra-abdominal infection = continue on micafungin for now. Last imagine of abdomen on 12/21 showed peritonitis with free fluid collection of 5.2 x 4.2cm but not loculated. If WBC does not continue to trend down, would revisit with IR to drain fluid since she is no longer on the vent.   Baxter Flattery [facility] for Infectious Diseases Cell: [phone] Pager: [phone]  10/07/2014,  9:53 AM

## 2014-10-07 NOTE — Evaluation (Signed)
Physical Therapy Evaluation Patient Details Name: [name] MRN: [id number] DOB: 10-18-1965 Today's Date: 10/07/2014   History of Present Illness  48 yo female smoker with PMH of MS, depression presents with abd pain, found to have pneumoperitoneum 2/2 small bowel rupture, also with new cold L hand.  Required surgery  for DU perf & left brachial bypass, fungemia noted on admission cultures  Clinical Impression  Pt admitted with the above complications. Pt currently with functional limitations due to the deficits listed below (see PT Problem List). Tolerated bed mobility and stand pivot transfers today with mod assist +2. Performed therapeutic exercises and discussed importance of frequent mobility with patient. Pt will benefit from skilled PT to increase their independence and safety with mobility to allow discharge to the venue listed below.       Follow Up Recommendations SNF;Supervision/Assistance - 24 hour    Equipment Recommendations  None recommended by PT    Recommendations for Other Services OT consult     Precautions / Restrictions Precautions Precautions: Fall Precaution Comments: Monitor for skin integrity Required Braces or Orthoses: Other Brace/Splint (PRAFO) Other Brace/Splint: PRAFO Restrictions Weight Bearing Restrictions: No      Mobility  Bed Mobility Overal bed mobility: Needs Assistance;+2 for physical assistance Bed Mobility: Rolling;Sidelying to Sit Rolling: Min assist Sidelying to sit: Mod assist;+2 for physical assistance       General bed mobility comments: Min assist for rolling to edge of bed to bring LEs off of bed. Mod assist +2 for truncal support into seated position.  Transfers Overall transfer level: Needs assistance Equipment used: 2 person hand held assist Transfers: Sit to/from [facility] Sit to Stand: Mod assist;+2 physical assistance Stand pivot transfers: Mod assist;+2 physical assistance       General  transfer comment: Mod assist for boost to stand with tactile cues for facilitation of anterior weight shift. Pt leans heavily towards posterior. Able to tolerate small steps and pivot to chair but Mod assist +2 for balance needed to maintain upright position. No instance of knees buckling. Fatigued rapidly. HR up to 131 with transfer.  Ambulation/Gait                Stairs            Wheelchair Mobility    Modified Rankin (Stroke Patients Only)       Balance Overall balance assessment: Needs assistance Sitting-balance support: Bilateral upper extremity supported;Feet supported Sitting balance-Leahy Scale: Poor   Postural control: Posterior lean Standing balance support: Bilateral upper extremity supported Standing balance-Leahy Scale: Poor                               Pertinent Vitals/Pain Pain Assessment: Faces Faces Pain Scale: Hurts little more Pain Location: Lt shoulder Pain Intervention(s): Monitored during session;Repositioned  BP 119/79 HR 113-131 SpO2 93%    Home Living Family/patient expects to be discharged to:: Unsure Living Arrangements: Other (Comment) ("land-lady") Available Help at Discharge: Other (Comment) ("[facility]") Type of Home: Mobile home Home Access: Stairs to enter Entrance Stairs-Rails: Right Entrance Stairs-Number of Steps: 3-4 Home Layout: One level Home Equipment: Walker - 2 wheels;Cane - single point Additional Comments: Patient able to answer some information. Parts of history taken from previous documented notes    Prior Function Level of Independence: Needs assistance   Gait / Transfers Assistance Needed: States she would ambulate with a rolling walker or cane without assistance.  ADL's /  Homemaking Assistance Needed: Reports she did need assist for bathing and dressing.        Hand Dominance   Dominant Hand: Right    Extremity/Trunk Assessment   Upper Extremity Assessment: Defer to OT evaluation            Lower Extremity Assessment: Generalized weakness;Difficult to assess due to impaired cognition         Communication   Communication: Expressive difficulties (dysarthria, low tone)  Cognition Arousal/Alertness: Awake/alert Behavior During Therapy: Flat affect;Impulsive Overall Cognitive Status: No family/caregiver present to determine baseline cognitive functioning (follows single step commands, easily distracted)                      General Comments General comments (skin integrity, edema, etc.): Pt with bowel incontinence near beginning of therapy session.    Exercises General Exercises - Lower Extremity Ankle Circles/Pumps: AAROM;Both;15 reps;Seated Quad Sets: Strengthening;Both;5 reps;Seated (difficulty understanding this task) Other Exercises Other Exercises: Passive ankle dorisflexion x 1 minute each LE. Other Exercises: Sitting balance at edge of bed with intermittent min assist for support. Again unsupported sitting in chair with min assist. x 2 minutes each.      Assessment/Plan    PT Assessment Patient needs continued PT services  PT Diagnosis Difficulty walking;Generalized weakness;Acute pain;Altered mental status   PT Problem List Decreased strength;Decreased range of motion;Decreased activity tolerance;Decreased balance;Decreased mobility;Decreased cognition;Decreased knowledge of use of DME;Decreased safety awareness;Cardiopulmonary status limiting activity;Pain  PT Treatment Interventions DME instruction;Gait training;Functional mobility training;Therapeutic activities;Therapeutic exercise;Balance training;Neuromuscular re-education;Cognitive remediation;Patient/family education;Modalities   PT Goals (Current goals can be found in the Care Plan section) Acute Rehab PT Goals Patient Stated Goal: GO home  PT Goal Formulation: With patient Time For Goal Achievement: 10/21/14 Potential to Achieve Goals: Good    Frequency Min 3X/week    Barriers to discharge Decreased caregiver support Lives with "[facility]"    Co-evaluation               End of Session Equipment Utilized During Treatment: Gait belt Activity Tolerance: Patient limited by fatigue Patient left: in chair;with call bell/phone within reach;with nursing/sitter in room;with restraints reapplied Nurse Communication: Mobility status;Other (comment) (bowel incontinence)         Time: 0355-9741 (-10 minutes while nursing performed hygiene with pt.) PT Time Calculation (min) (ACUTE ONLY): 42 min   Charges:   PT Evaluation $Initial PT Evaluation Tier I: 1 Procedure PT Treatments $Therapeutic Exercise: 8-22 mins $Therapeutic Activity: 8-22 mins   PT G CodesEllouise Newer 10/07/2014, 3:17 PM Camille [place], [place]

## 2014-10-08 ENCOUNTER — Inpatient Hospital Stay (HOSPITAL_COMMUNITY): Payer: BLUE CROSS/BLUE SHIELD

## 2014-10-08 LAB — GLUCOSE, CAPILLARY
GLUCOSE-CAPILLARY: 231 mg/dL — AB (ref 70–99)
Glucose-Capillary: 310 mg/dL — ABNORMAL HIGH (ref 70–99)
Glucose-Capillary: 149 mg/dL — ABNORMAL HIGH (ref 70–99)
Glucose-Capillary: 141 mg/dL — ABNORMAL HIGH (ref 70–99)

## 2014-10-08 LAB — COMPREHENSIVE METABOLIC PANEL
ALK PHOS: 217 U/L — AB (ref 39–117)
ALT: 37 U/L — ABNORMAL HIGH (ref 0–35)
ALT: 53 U/L — AB (ref 0–35)
ANION GAP: 8 (ref 5–15)
ANION GAP: 10 (ref 5–15)
AST: 38 U/L — ABNORMAL HIGH (ref 0–37)
AST: 74 U/L — ABNORMAL HIGH (ref 0–37)
Albumin: 2.3 g/dL — ABNORMAL LOW (ref 3.5–5.2)
Albumin: 2.1 g/dL — ABNORMAL LOW (ref 3.5–5.2)
Alkaline Phosphatase: 214 U/L — ABNORMAL HIGH (ref 39–117)
BILIRUBIN TOTAL: 0.7 mg/dL (ref 0.3–1.2)
BUN: 17 mg/dL (ref 6–23)
BUN: 19 mg/dL (ref 6–23)
CO2: 35 mmol/L — AB (ref 19–32)
CO2: 35 mmol/L — AB (ref 19–32)
CREATININE: 0.91 mg/dL (ref 0.50–1.10)
Calcium: 8.3 mg/dL — ABNORMAL LOW (ref 8.4–10.5)
Calcium: 7.8 mg/dL — ABNORMAL LOW (ref 8.4–10.5)
Chloride: 101 mEq/L (ref 96–112)
Chloride: 103 mEq/L (ref 96–112)
Creatinine, Ser: 0.69 mg/dL (ref 0.50–1.10)
GFR calc Af Amer: >90 mL/min (ref >90)
GFR calc Af Amer: 84 mL/min — ABNORMAL LOW (ref >90)
GFR calc non Af Amer: >90 mL/min (ref >90)
GFR calc non Af Amer: 73 mL/min — ABNORMAL LOW (ref >90)
GLUCOSE: 144 mg/dL — AB (ref 70–99)
Glucose, Bld: 246 mg/dL — ABNORMAL HIGH (ref 70–99)
Potassium: 3.5 mmol/L (ref 3.5–5.1)
Potassium: 2.8 mmol/L — ABNORMAL LOW (ref 3.5–5.1)
Sodium: 144 mmol/L (ref 135–145)
Sodium: 148 mmol/L — ABNORMAL HIGH (ref 135–145)
Total Bilirubin: 0.5 mg/dL (ref 0.3–1.2)
Total Protein: 7.8 g/dL (ref 6.0–8.3)
Total Protein: 7.3 g/dL (ref 6.0–8.3)

## 2014-10-08 LAB — POCT I-STAT 3, ART BLOOD GAS (G3+)
ACID-BASE EXCESS: 10 mmol/L — AB (ref 0.0–2.0)
ACID-BASE EXCESS: 10 mmol/L — AB (ref 0.0–2.0)
Acid-Base Excess: 10 mmol/L — ABNORMAL HIGH (ref 0.0–2.0)
BICARBONATE: 35.2 meq/L — AB (ref 20.0–24.0)
BICARBONATE: 34.2 meq/L — AB (ref 20.0–24.0)
Bicarbonate: 36.1 mEq/L — ABNORMAL HIGH (ref 20.0–24.0)
O2 Saturation: 92 %
O2 Saturation: 94 %
O2 Saturation: 97 %
PCO2 ART: 54.2 mmHg — AB (ref 35.0–45.0)
PO2 ART: 88 mmHg (ref 80.0–100.0)
Patient temperature: 98.5
Patient temperature: 102.6
TCO2: 38 mmol/L (ref 0–100)
TCO2: 37 mmol/L (ref 0–100)
TCO2: 35 mmol/L (ref 0–100)
pCO2 arterial: 55.4 mmHg — ABNORMAL HIGH (ref 35.0–45.0)
pCO2 arterial: 44.3 mmHg (ref 35.0–45.0)
pH, Arterial: 7.43 (ref 7.350–7.450)
pH, Arterial: 7.42 (ref 7.350–7.450)
pH, Arterial: 7.501 — ABNORMAL HIGH (ref 7.350–7.450)
pO2, Arterial: 63 mmHg — ABNORMAL LOW (ref 80.0–100.0)
pO2, Arterial: 82 mmHg (ref 80.0–100.0)

## 2014-10-08 LAB — MAGNESIUM
Magnesium: 2.3 mg/dL (ref 1.5–2.5)
Magnesium: 2.1 mg/dL (ref 1.5–2.5)

## 2014-10-08 LAB — CBC
HEMATOCRIT: 34.1 % — AB (ref 36.0–46.0)
HEMOGLOBIN: 10.3 g/dL — AB (ref 12.0–15.0)
MCH: 30.8 pg (ref 26.0–34.0)
MCHC: 30.2 g/dL (ref 30.0–36.0)
MCV: 102.1 fL — ABNORMAL HIGH (ref 78.0–100.0)
Platelets: 854 10*3/uL — ABNORMAL HIGH (ref 150–400)
RBC: 3.34 MIL/uL — ABNORMAL LOW (ref 3.87–5.11)
RDW: 14.4 % (ref 11.5–15.5)
WBC: 23.5 10*3/uL — ABNORMAL HIGH (ref 4.0–10.5)

## 2014-10-08 LAB — CLOSTRIDIUM DIFFICILE BY PCR: CDIFFPCR: NEGATIVE

## 2014-10-08 LAB — TROPONIN I
TROPONIN I: 1.73 ng/mL — AB (ref <0.031)
Troponin I: 1.53 ng/mL (ref <0.031)

## 2014-10-08 LAB — CULTURE, BLOOD (ROUTINE X 2)
CULTURE: NO GROWTH
Culture: NO GROWTH

## 2014-10-08 LAB — PHOSPHORUS: Phosphorus: 3.6 mg/dL (ref 2.3–4.6)

## 2014-10-08 MED ORDER — DOCUSATE SODIUM 50 MG/5ML PO LIQD
100.0000 mg | Freq: Two times a day (BID) | ORAL | Status: DC | PRN
  Filled 2014-10-08: qty 10

## 2014-10-08 MED ORDER — SODIUM CHLORIDE 0.9 % IJ SOLN
10.0000 mL | Freq: Two times a day (BID) | INTRAMUSCULAR | Status: DC
  Administered 2014-10-08: 30 mL
  Administered 2014-10-10: 10 mL
  Administered 2014-10-11: 20 mL
  Administered 2014-10-12 – 2014-10-18 (×13): 10 mL

## 2014-10-08 MED ORDER — IPRATROPIUM-ALBUTEROL 0.5-2.5 (3) MG/3ML IN SOLN
3.0000 mL | RESPIRATORY_TRACT | Status: DC | PRN
  Administered 2014-10-08 (×2): 3 mL via RESPIRATORY_TRACT
  Filled 2014-10-08: qty 3

## 2014-10-08 MED ORDER — SODIUM CHLORIDE 0.9 % IV SOLN
25.0000 ug/h | INTRAVENOUS | Status: DC
  Administered 2014-10-08: 50 ug/h via INTRAVENOUS
  Administered 2014-10-09: 300 ug/h via INTRAVENOUS
  Administered 2014-10-09 – 2014-10-11 (×6): 400 ug/h via INTRAVENOUS
  Administered 2014-10-12: 300 ug/h via INTRAVENOUS
  Administered 2014-10-12 – 2014-10-14 (×6): 400 ug/h via INTRAVENOUS
  Administered 2014-10-14: 200 ug/h via INTRAVENOUS
  Filled 2014-10-08 (×20): qty 50

## 2014-10-08 MED ORDER — CHLORHEXIDINE GLUCONATE 0.12 % MT SOLN
15.0000 mL | Freq: Two times a day (BID) | OROMUCOSAL | Status: DC
Start: 2014-10-08 — End: 2014-10-18
  Administered 2014-10-08 – 2014-10-18 (×20): 15 mL via OROMUCOSAL
  Filled 2014-10-08 (×18): qty 15

## 2014-10-08 MED ORDER — MORPHINE SULFATE 2 MG/ML IJ SOLN
INTRAMUSCULAR | Status: AC
Start: 2014-10-08 — End: 2014-10-08
  Filled 2014-10-08: qty 1

## 2014-10-08 MED ORDER — SODIUM CHLORIDE 0.9 % IJ SOLN: 10.0000 mL | INTRAMUSCULAR | Status: DC | PRN

## 2014-10-08 MED ORDER — CEFTRIAXONE SODIUM IN DEXTROSE 20 MG/ML IV SOLN
1.0000 g | INTRAVENOUS | Status: DC
  Administered 2014-10-08: 1 g via INTRAVENOUS
  Filled 2014-10-08: qty 50

## 2014-10-08 MED ORDER — MIDAZOLAM HCL 2 MG/2ML IJ SOLN
INTRAMUSCULAR | Status: AC
  Administered 2014-10-08: 09:00:00
  Filled 2014-10-08: qty 2

## 2014-10-08 MED ORDER — FENTANYL CITRATE 0.05 MG/ML IJ SOLN
50.0000 ug | Freq: Once | INTRAMUSCULAR | Status: DC
  Filled 2014-10-08: qty 2

## 2014-10-08 MED ORDER — FENTANYL BOLUS VIA INFUSION
50.0000 ug | INTRAVENOUS | Status: DC | PRN
  Administered 2014-10-09: 100 ug via INTRAVENOUS
  Administered 2014-10-11 – 2014-10-13 (×3): 50 ug via INTRAVENOUS
  Filled 2014-10-08: qty 50

## 2014-10-08 MED ORDER — LORAZEPAM 2 MG/ML IJ SOLN
0.5000 mg | Freq: Once | INTRAMUSCULAR | Status: AC
  Administered 2014-10-08: 0.5 mg via INTRAVENOUS
  Filled 2014-10-08: qty 1

## 2014-10-08 MED ORDER — LORAZEPAM 2 MG/ML IJ SOLN
1.0000 mg | Freq: Once | INTRAMUSCULAR | Status: AC
  Administered 2014-10-08: 1 mg via INTRAVENOUS
  Filled 2014-10-08: qty 1

## 2014-10-08 MED ORDER — CETYLPYRIDINIUM CHLORIDE 0.05 % MT LIQD
7.0000 mL | Freq: Four times a day (QID) | OROMUCOSAL | Status: DC
  Administered 2014-10-09 – 2014-10-18 (×40): 7 mL via OROMUCOSAL

## 2014-10-08 MED ORDER — CEFTRIAXONE SODIUM IN DEXTROSE 20 MG/ML IV SOLN
1.0000 g | INTRAVENOUS | Status: DC
  Administered 2014-10-09: 1 g via INTRAVENOUS
  Filled 2014-10-08: qty 50

## 2014-10-08 MED ORDER — SODIUM CHLORIDE 0.9 % IV BOLUS (SEPSIS)
500.0000 mL | Freq: Once | INTRAVENOUS | Status: AC
  Administered 2014-10-08: 500 mL via INTRAVENOUS

## 2014-10-08 MED ORDER — LORAZEPAM 2 MG/ML IJ SOLN
INTRAMUSCULAR | Status: AC
  Filled 2014-10-08: qty 1

## 2014-10-08 MED ORDER — DEXMEDETOMIDINE HCL IN NACL 200 MCG/50ML IV SOLN
0.2000 ug/kg/h | INTRAVENOUS | Status: DC
  Administered 2014-10-08: 0.4 ug/kg/h via INTRAVENOUS
  Administered 2014-10-08: 1.2 ug/kg/h via INTRAVENOUS
  Filled 2014-10-08 (×2): qty 50

## 2014-10-08 MED ORDER — FENTANYL CITRATE 0.05 MG/ML IJ SOLN
INTRAMUSCULAR | Status: AC
  Administered 2014-10-08: 09:00:00
  Filled 2014-10-08: qty 4

## 2014-10-08 MED ORDER — ATROPINE SULFATE 0.1 MG/ML IJ SOLN
INTRAMUSCULAR | Status: AC
  Filled 2014-10-08: qty 10

## 2014-10-08 MED ORDER — MIDAZOLAM HCL 2 MG/2ML IJ SOLN
INTRAMUSCULAR | Status: AC
  Administered 2014-10-08: 2 mg
  Filled 2014-10-08: qty 2

## 2014-10-08 MED ORDER — IPRATROPIUM-ALBUTEROL 0.5-2.5 (3) MG/3ML IN SOLN
RESPIRATORY_TRACT | Status: AC
  Filled 2014-10-08: qty 3

## 2014-10-08 MED ORDER — LORAZEPAM 2 MG/ML IJ SOLN
1.0000 mg | Freq: Once | INTRAMUSCULAR | Status: AC
  Administered 2014-10-08: 1 mg via INTRAVENOUS

## 2014-10-08 MED ORDER — METRONIDAZOLE IN NACL 5-0.79 MG/ML-% IV SOLN
500.0000 mg | Freq: Three times a day (TID) | INTRAVENOUS | Status: DC
  Administered 2014-10-08 – 2014-10-11 (×8): 500 mg via INTRAVENOUS
  Filled 2014-10-08 (×10): qty 100

## 2014-10-08 MED ORDER — RACEPINEPHRINE HCL 2.25 % IN NEBU: 0.5000 mL | INHALATION_SOLUTION | Freq: Once | RESPIRATORY_TRACT | Status: DC

## 2014-10-08 MED ORDER — METRONIDAZOLE IN NACL 5-0.79 MG/ML-% IV SOLN
500.0000 mg | Freq: Three times a day (TID) | INTRAVENOUS | Status: DC
  Administered 2014-10-08: 500 mg via INTRAVENOUS
  Filled 2014-10-08 (×3): qty 100

## 2014-10-08 MED ORDER — TRACE MINERALS CR-CU-F-FE-I-MN-MO-SE-ZN IV SOLN: INTRAVENOUS | Status: DC

## 2014-10-08 MED ORDER — POTASSIUM CHLORIDE 20 MEQ/15ML (10%) PO SOLN
40.0000 meq | Freq: Once | ORAL | Status: AC
  Administered 2014-10-08: 40 meq
  Filled 2014-10-08 (×2): qty 30

## 2014-10-08 MED ORDER — EPINEPHRINE HCL 0.1 MG/ML IJ SOSY
PREFILLED_SYRINGE | INTRAMUSCULAR | Status: AC
  Filled 2014-10-08: qty 10

## 2014-10-08 MED ORDER — MORPHINE SULFATE 2 MG/ML IJ SOLN
2.0000 mg | Freq: Once | INTRAMUSCULAR | Status: AC
Start: 2014-10-08 — End: 2014-10-08
  Administered 2014-10-08: 2 mg via INTRAVENOUS

## 2014-10-08 MED ORDER — POTASSIUM CHLORIDE 10 MEQ/50ML IV SOLN
10.0000 meq | INTRAVENOUS | Status: AC
  Administered 2014-10-08 (×4): 10 meq via INTRAVENOUS
  Filled 2014-10-08 (×4): qty 50

## 2014-10-08 MED ORDER — FUROSEMIDE 10 MG/ML IJ SOLN
40.0000 mg | Freq: Once | INTRAMUSCULAR | Status: AC
  Administered 2014-10-08: 40 mg via INTRAVENOUS
  Filled 2014-10-08: qty 4

## 2014-10-08 MED ORDER — RACEPINEPHRINE HCL 2.25 % IN NEBU
0.5000 mL | INHALATION_SOLUTION | Freq: Once | RESPIRATORY_TRACT | Status: AC
  Administered 2014-10-08: 0.5 mL via RESPIRATORY_TRACT
  Filled 2014-10-08: qty 0.5

## 2014-10-08 MED FILL — Medication: Qty: 1 | Status: AC

## 2014-10-08 NOTE — Progress Notes (Signed)
Second prn given per md.

## 2014-10-08 NOTE — Progress Notes (Signed)
PCCM Interval Progress Note  Called by Warren Lacy MD to assess pt at bedside.  Over the course of the night, pt has had development of tachypnea and hypoxia.  She was on RA previously but is now on 4L O2 and SpO2 is 97%.  She has also developed a "grunting / raspy" sound with her breathing.  She denies any SOB, chest pain, feelings as if throat is closing in / trouble getting air in.  When asked if this has happened before, she nods her head yes.  She is unable to tell me what interventions were done at the time / what medication she received.  Since symptom onset, she has received 3.5mg  Haldol, 0.5mg  Ativan, 2mg  Morphine, 3 breathing treatments.  BP 147/77 mmHg  Pulse 124  Temp(Src) 98.9 F (37.2 C) (Oral)  Resp 27  Ht 4\' 9"  (1.448 m)  Wt 67.1 kg (147 lb 14.9 oz)  BMI 32.00 kg/m2  SpO2 96%  Exam: General:  WDWN female, in NAD. Neuro: Awake, follows commands, converses but voice very raspy. Heart:  Tachy, regular. Lungs:  Very faint wheeze, decreased air movement.  No upper airway stridor heard. Mild accessory muscle use.  CXR 12/31: no acute process ABG 12/31:  7.43 / 54 / 63  A/P: Acute hypoxic respiratory failure Tachypnea ? VCD Anxiety Will try one round of racemic epinephrine via nebulizer. Trial of BiPAP. 1mg  Ativan. If no improvement or worsens, may need to move to intubation (will try to avoid as best we can).   Montey Hora, [facility] Pgr: [phone]  or [phone] 10/08/2014, 2:20 AM

## 2014-10-08 NOTE — Progress Notes (Addendum)
  [facility] Progress Note  10/08/2014 7:29 AM 11 Days Post-Op  Subjective:  On bipap with increased respiratory rate   Filed Vitals:   10/08/14 0728  BP:   Pulse:   Temp: 101.6 F (38.7 C)  Resp:     Physical Exam General: on bipap with increased work of breathing.  Incisions:  Left arm incisions and left thigh vein harvest site clean and intact.  Extremities:  Left hand warm and moderately edematous. Palpable left graft pulse, palpable 2+ radial pulse. Blistering of left hand unchanged.  CBC    Component Value Date/Time   WBC 23.5* 10/08/2014 0425   RBC 3.34* 10/08/2014 0425   HGB 10.3* 10/08/2014 0425   HCT 34.1* 10/08/2014 0425   PLT 854* 10/08/2014 0425   MCV 102.1* 10/08/2014 0425   MCH 30.8 10/08/2014 0425   MCHC 30.2 10/08/2014 0425   RDW 14.4 10/08/2014 0425   LYMPHSABS 4.6* 10/05/2014 0346   MONOABS 1.6* 10/05/2014 0346   EOSABS 0.8* 10/05/2014 0346   BASOSABS 0.3* 10/05/2014 0346    BMET    Component Value Date/Time   NA 144 10/08/2014 0425   K 3.5 10/08/2014 0425   CL 101 10/08/2014 0425   CO2 35* 10/08/2014 0425   GLUCOSE 246* 10/08/2014 0425   BUN 17 10/08/2014 0425   CREATININE 0.69 10/08/2014 0425   CREATININE 0.80 12/08/2013 1313   CALCIUM 8.3* 10/08/2014 0425   GFRNONAA >90 10/08/2014 0425   GFRAA >90 10/08/2014 0425    INR    Component Value Date/Time   INR 1.25 10/05/2014 0732     Intake/Output Summary (Last 24 hours) at 10/08/14 0729 Last data filed at 10/08/14 0700  Gross per 24 hour  Intake 2881.59 ml  Output   1280 ml  Net 1601.59 ml     Assessment:  48 y.o. female is s/p: left brachial to radial artery bypass with left greater saphenous vein 11 Days Post-Op  Plan: -Left upper extremity remains stable. Continue local wound care to left thigh vein harvest site. -Patient likely to be intubated again today given high respiratory rate and increased respiratory effort.   Virgina Jock,  PA-C [facility] Office: [phone] Pager: [phone] 10/08/2014 7:29 AM   Agree with above.  Continue local wound care. Bypass graft is patent.  Deitra Mayo, MD, [facility] [phone] 10/08/2014

## 2014-10-08 NOTE — Progress Notes (Signed)
LOS 12 days    Lab review   PULMONARY  Recent Labs Lab 10/02/14 0920 10/08/14 0154 10/08/14 0601 10/08/14 1051  PHART 7.249* 7.430 7.420 7.501*  PCO2ART 50.1* 54.2* 55.4* 44.3  PO2ART 71.0* 63.0* 82.0 88.0  HCO3 21.9 36.1* 35.2* 34.2*  TCO2 23 38 37 35  O2SAT 91.0 92.0 94.0 97.0    CBC  Recent Labs Lab 10/06/14 0420 10/07/14 0500 10/08/14 0425  HGB 9.7* 10.2* 10.3*  HCT 31.7* 33.2* 34.1*  WBC 25.1* 20.9* 23.5*  PLT 732* 764* 854*    COAGULATION  Recent Labs Lab 10/05/14 0732  INR 1.25    CARDIAC   Recent Labs Lab 10/08/14 1645  TROPONINI 1.53*   No results for input(s): PROBNP in the last 168 hours.   CHEMISTRY  Recent Labs Lab 10/03/14 0500  10/04/14 0400 10/04/14 1944 10/05/14 0346  10/05/14 1700 10/06/14 0420 10/07/14 0500 10/08/14 0425 10/08/14 1645  NA 140  < > 141  --  138  < > 139 137 139 144 148*  K 2.9*  < > 3.6 3.3* 3.8  < > 3.2* 3.7 3.6 3.5 2.8*  CL 108  < > 99  --  94*  < > 94* 92* 92* 101 103  CO2 27  < > 32  --  37*  < > 37* 34* 36* 35* 35*  GLUCOSE 138*  < > 118*  --  115*  < > 186* 145* 208* 246* 144*  BUN <5*  < > <5*  --  6  < > 7 8 11 17 19   CREATININE 0.72  < > 0.68  --  0.67  < > 0.70 0.66 0.68 0.69 0.91  CALCIUM 7.7*  < > 8.0*  --  7.6*  < > 8.0* 8.0* 8.5 8.3* 7.8*  MG 1.8  --  1.5 2.5 2.2  --   --   --  2.2 2.3  --   PHOS 1.7*  --  3.2  --  3.5  --   --   --  4.3 3.6  --   < > = values in this interval not displayed. Estimated Creatinine Clearance: 59 mL/min (by C-G formula based on Cr of 0.91).   LIVER  Recent Labs Lab 10/05/14 0346 10/05/14 0732 10/06/14 0420 10/08/14 0425 10/08/14 1645  AST 45*  --  40* 38* 74*  ALT 31  --  29 37* 53*  ALKPHOS 102  --  117 214* 217*  BILITOT 1.4*  --  0.8 0.5 0.7  PROT 5.8*  --  6.5 7.8 7.3  ALBUMIN 1.5*  --  1.7* 2.3* 2.1*  INR  --  1.25  --   --   --      INFECTIOUS No results for input(s): LATICACIDVEN, PROCALCITON in the last 168  hours.   ENDOCRINE CBG (last 3)   Recent Labs  10/08/14 0755 10/08/14 1113 10/08/14 1551  GLUCAP 231* 310* 149*         IMAGING x48h Ct Head Wo Contrast  10/08/2014   CLINICAL DATA:  Cardiac arrest. Now with blown pupil. Initial evaluation.  EXAM: CT HEAD WITHOUT CONTRAST  TECHNIQUE: Contiguous axial images were obtained from the base of the skull through the vertex without intravenous contrast.  COMPARISON:  Prior MRI from 04/03/2014 as well as earlier studies.  FINDINGS: Cerebral volume is stable.  Focal hypodense lesions involving the globus pallidum bilaterally are stable relative to previous studies. Basal ganglia otherwise symmetric and normal in appearance.  Gray-white matter differentiation is maintained. No evidence for anoxia at this time. No large vessel or cortical infarct identified. No new encephalomalacia.  No mass lesion or midline shift. No hydrocephalus. There is no extra-axial fluid collection.  Scalp soft tissues within normal limits. No acute abnormality seen about the orbits.  Nasogastric tube partially visualized. Mild opacity noted within the posterior left sphenoid sinus. Scattered opacity noted within the right mastoid air cells.  Calvarium intact.  IMPRESSION: 1. Stable appearance of the brain with no new acute intracranial abnormality identified. 2. 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   Electronically Signed   By: Jeannine Boga M.D.   On: 10/08/2014 17:53   Dg Chest Port 1 View  10/08/2014   CLINICAL DATA:  Cardiac arrest.  Respiratory distress.  EXAM: PORTABLE CHEST - 1 VIEW  COMPARISON:  Earlier the same day at 01:47 hr  FINDINGS: 0932 hr: Interval placement of endotracheal tube 4.4 cm from the carina. The left internal jugular central line is been minimally retracted, tip remains in the proximal SVC. Enteric tube in place, tip and side port below the diaphragm. There is increasing pleural parenchymal opacity at the left lung  base. Unchanged linear opacities at the right lung base. Cardiomediastinal contours are unchanged. No evidence of pneumothorax.  IMPRESSION: 1. Endotracheal tube 4.4 cm from the carina. Left internal jugular venous catheter mildly retracted from prior, tip remains in the proximal SVC. 2. Increasing left basilar pleural parenchymal opacity.   Electronically Signed   By: Jeb Levering M.D.   On: 10/08/2014 10:02   Dg Chest Port 1 View  10/08/2014   CLINICAL DATA:  Respiratory distress  EXAM: PORTABLE CHEST - 1 VIEW  COMPARISON:  10/07/2014  FINDINGS: Stable positioning of left IJ catheter, tip at the SVC. A nasogastric tube remains in the stomach.  Unchanged left more than right base or lung opacities. Although these have a streaky appearance, they appeared more typical of airspace disease on abdominal CT 10/03/2014. No edema, significant effusion, or pneumothorax.  IMPRESSION: Unchanged bibasilar atelectasis or pneumonia, left worse than right.   Electronically Signed   By: Jorje Guild M.D.   On: 10/08/2014 02:32   Dg Chest Port 1 View  10/07/2014   CLINICAL DATA:  Respiratory failure.  EXAM: PORTABLE CHEST - 1 VIEW  COMPARISON:  12/15/2013.  FINDINGS: Interim extubation. Left IJ line NG tube in stable position. Persistent bibasilar atelectasis. Heart size normal. No pleural effusion or pneumothorax. Prior cervical spine fusion.  IMPRESSION: 1. Interim extubation left IJ line and NG tube in stable position. 2. Persistent bibasilar atelectasis.   Electronically Signed   By: Marcello Moores  Register   On: 10/07/2014 07:48    A CT head - nil acute K - low Trop  1.5 aftrer resp arrest (normal > 10 days ago)   P Ekg, cycle trop Replete K, check Mag  Dr. Brand Males, M.D., [facility].C.P Pulmonary and Critical Care Medicine Staff Physician [facility] Pager: [phone], If no answer or between  15:00h - 7:00h: call [phone]  10/08/2014 6:35  PM

## 2014-10-08 NOTE — Progress Notes (Addendum)
She was in respiratory distress On BiPAP. Decision made to intubate. Procedure was complicated by bradycardia, brief respiratory arrest 2 minutes, requiring CPR and epinephrine 1 with prompt return of pulse and circulation. A 7.0 ET tube was inserted due to cord edema. Zosyn was started for aspiration pneumonitis. Follow-up ABG shows good ventilation with severe hypoxia with PO2 of 88 on 100% FiO2. Pulmonary embolism is low probability, given that she has been on Lovenox treatment doses for the last few days, also venous duplex of both lower extremities were negative on 12/20. We will treat with low tidal volume ventilation and aspiration pneumonitis. Will follow-up neuro checks, expect her to be neurologically intact, if needed will use fentanyl drip for sedation. Her domestic partner, [name] was updated to these developments  I spoke to the surgical service, and they would defer tracheostomy to ENT. Have contacted Dr. Redmond Baseman who will schedule.  Additional critical care time x 30 mins  [name]. MD

## 2014-10-08 NOTE — Progress Notes (Signed)
CRITICAL VALUE ALERT  Critical value received:  Trop 1.53  Date of notification:  10/08/2014  Time of notification:  9201  Critical value read back:Yes.    Nurse who received alert:  Lilyan Gilford, RN  Responding MD:  CCM RN received critical value  Time MD responded:  0071

## 2014-10-08 NOTE — Progress Notes (Signed)
Pt had episodes of agitation with increased WOB over night and RR 45-60/min. Dr. Elsworth Soho at bedside to intubate pt this AM. Pt very agitated and tense. 2 mg Versed and 200 mcg Fentanyl administered prior to intubation, but pt remained agitated so an additional 2 mg of Versed was given.  20 mg Etomidate given next but pt was still too tense for intubation at this point. 50 mg Rocuronium given several minutes later and MD was finally able to relax jaw enough to attempt intubation. There was difficulty inserting ETT due to edematous vocal cords and thick secretions. Pt O2 sats stable throughout first intubation attempt. BVM used between attempts. On second attempt, pt began to desat and HR began trending down. Ventilated via BVM but HR continued to fall. Pulse lost, pt asystole and compressions started at 0912. 1 amp of epi administered at 0913 and ROSC achieved at 0916. Rhythm sinus tach and BP 142/77 at end of event. Will continue to monitor pt.  Pt hypotensive shortly after code event. 500 cc NS bolus administered per MD. BP stabilized. Will continue to monitor pt.

## 2014-10-08 NOTE — Progress Notes (Signed)
  Post arrest RN says Rt pupil fixed and dilated  Plan Stat head ct  Dr. Brand Males, M.D., [facility].C.P Pulmonary and Critical Care Medicine Staff Physician [place] [facility] Pager: [phone], If no answer or between  15:00h - 7:00h: call [phone]  10/08/2014 4:25 PM

## 2014-10-08 NOTE — Procedures (Signed)
Intubation Procedure Note [name] [id number] 09-17-1966  Procedure: Intubation Indications: Airway protection and maintenance  Procedure Details Consent: Risks of procedure as well as the alternatives and risks of each were explained to the (patient/caregiver).  Consent for procedure obtained. Time Out: Verified patient identification, verified procedure, site/side was marked, verified correct patient position, special equipment/implants available, medications/allergies/relevent history reviewed, required imaging and test results available.  Performed  Maximum sterile technique was used including gloves, gown, hand hygiene and mask.  MAC and 3    Evaluation Hemodynamic Status: BP stable throughout; O2 sats: transiently fell during during procedure Patient's Current Condition: stable Complications: Complications of respiratory arrest Patient did tolerate procedure well. Chest X-ray ordered to verify placement.  CXR: pending   Pt intubated due to increased WOB and RR 40-60 bpm. Pt unable to communicate at this point but consent was given by significant other via telephone. Pt had difficult airway with swelling and secretions. Dr Elsworth Soho attempted using MAC 3 with 7.5 ett but was unsuccessful. Upon switching to glidescope, pt was bradycardic and ultimately a code blue was called. Pt successfully intubated using glidescope #3 and a 7.0 ett was secured at 22 cm at the lip. Bilateral breath sounds were heard, positive color change in etco2, cxr pending at this time. Pt spo2 was decreased into low 80's following code, fio2 increased to 100% at this time. Will adjust after ABG.   [name] 10/08/2014

## 2014-10-08 NOTE — Progress Notes (Signed)
   10/08/14 0900  Clinical Encounter Type  Visited With Health care provider  Visit Type Code   Chaplain responded to a code blue at 9:13 AM. Medical team was working with patient. No family was present. Page Elodia Florence chaplain if further support is needed. Gar Ponto, Chaplain  9:24 AM

## 2014-10-08 NOTE — Progress Notes (Signed)
[facility] for Infectious Disease    Date of Admission:  09/26/2014   Total days of antibiotics 12        Day 7 micafungin                   ID: [name] is a 48 y.o. 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 Left brachial to left radial artery bypass with left leg non-reversed greater saphenous vein. Intra-arterial injection of TPA. Hospitalization also complicated by HCAP, VDRF s/p extubation on 12/29. She remains afebrile since 12/28. Cleared fungemia on 12/24  Principal Problem:   Pneumoperitoneum Active Problems:   Sepsis   Altered mental state   Brachial artery occlusion   Acute respiratory failure with hypoxia   Severe sepsis   Perforated gastric ulcer   Acute respiratory failure   Artery occlusion   Blood poisoning   Candidemia   Screen for STD (sexually transmitted disease)   Abdominal abscess   Septic thrombophlebitis   Elevated WBCs   Fever   Limb ischemia   HCAP (healthcare-associated pneumonia)    Subjective: Last night had increase work of breathing, placed on bipap given haldol to help with anxiety. Had fever with tmax of 102.6, and 101F this am. CXR did not suggest new infiltrate. Labs reveal increase leukocytosis of 23. She had worsening respiratoyr distress requiring intubation at 9am but also in setting of hypoxia, bradycardia, received ACLS CPR  x 2 min. 1 dose of epi. During intubation, it was noted that she vomited and also had edematous vocal  Cords. Post intubation still having high fevers of 103F  Medications:  . atropine      . citalopram  20 mg Oral Q1200  . clonazePAM   1 mg Oral BID  . enoxaparin (LOVENOX) injection  70 mg Subcutaneous Q12H  . fentaNYL      . furosemide  40 mg Intravenous Daily  . insulin aspart  0-9 Units Subcutaneous 6 times per day  . ipratropium-albuterol      . micafungin (MYCAMINE) IV  100 mg Intravenous Q24H  . midazolam      . midazolam      . pantoprazole sodium  40 mg Per Tube Q1200    Objective: Vital signs in last 24 hours: Temp:  [98.3 F (36.8 C)-102.6 F (39.2 C)] 101.6 F (38.7 C) (12/31 0728) Pulse Rate:  [111-132] 118 (12/31 0719) Resp:  [15-60] 46 (12/31 0719) BP: (101-157)/(53-105) 133/102 mmHg (12/31 0719) SpO2:  [72 %-100 %] 100 % (12/31 0719) FiO2 (%):  [40 %] 40 % (12/31 0719) Physical Exam  Constitutional:  oriented to person, place,only. appears well-nourished. Mildly distress. Wearing bipap HENT: ng in place, Mouth/Throat: Oropharynx is clear and moist. No oropharyngeal exudate. Left IJ in place Cardiovascular: Normal rate, regular rhythm and normal heart sounds. Exam reveals no gallop and no friction rub.  No murmur heard.  Pulmonary/Chest: Effort normal and breath sounds normal. No respiratory distress.  has no wheezes.  Abdominal: Soft. Bowel sounds are decreased. Abdominal bandage in place. Skin: Skin is warm and dry. No rash noted. No erythema. Skin incision is healing from vascular surger   Lab Results  Recent  Labs  10/07/14 0500 10/08/14 0425  WBC 20.9* 23.5*  HGB 10.2* 10.3*  HCT 33.2* 34.1*  NA 139 144  K 3.6 3.5  CL 92* 101  CO2 36* 35*  BUN 11 17  CREATININE 0.68 0.69   Liver Panel  Recent Labs  10/06/14 0420 10/08/14 0425  PROT 6.5 7.8  ALBUMIN 1.7* 2.3*  AST 40* 38*  ALT 29 37*  ALKPHOS 117 214*  BILITOT 0.8 0.5   Sedimentation Rate No results for input(s): ESRSEDRATE in the last 72 hours. C-Reactive Protein No results for input(s): CRP in the last 72 hours.  Microbiology: 12/24 blood cx ngtd 12/19 blood cx c.glabrata 12/20 peritoneal fluid few  c.glabrata Studies/Results: Dg Chest Port 1 View  10/08/2014   CLINICAL DATA:  Respiratory distress  EXAM: PORTABLE CHEST - 1 VIEW  COMPARISON:  10/07/2014  FINDINGS: Stable positioning of left IJ catheter, tip at the SVC. A nasogastric tube remains in the stomach.  Unchanged left more than right base or lung opacities. Although these have a streaky appearance, they appeared more typical of airspace disease on abdominal CT 10/03/2014. No edema, significant effusion, or pneumothorax.  IMPRESSION: Unchanged bibasilar atelectasis or pneumonia, left worse than right.   Electronically Signed   By: Jorje Guild M.D.   On: 10/08/2014 02:32   Dg Chest Port 1 View  10/07/2014   CLINICAL DATA:  Respiratory failure.  EXAM: PORTABLE CHEST - 1 VIEW  COMPARISON:  12/15/2013.  FINDINGS: Interim extubation. Left IJ line NG tube in stable position. Persistent bibasilar atelectasis. Heart size normal. No pleural effusion or pneumothorax. Prior cervical spine fusion.  IMPRESSION: 1. Interim extubation left IJ line and NG tube in stable position. 2. Persistent bibasilar atelectasis.   Electronically Signed   By: Marcello Moores  Register   On: 10/07/2014 07:48   TTE 12/21: no vegetations  Assessment/Plan:  Recurrent fevers = infectious work up includes sending out stool for cdifficile and will repeat blood cx today. Will add amp/sub to cover possible aspiration pneumonia.repeat cultures have been sent.I would see if can also repeat abd CT to see if fluid collection in abdomen is more organized when clinically stable.  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 Would treat with micafungin since c.glabrata can have fluconazole resistance. Currently on day 7 of 28. Micro lab quoted that timing to get sensitivities back on c.glabrata isolate would be 2-3 wk thus would not return in time to impact  decision making.  Once delirium improved, recommend to do visual eye exam, to see if candidal involvement  Intra-abdominal infection = continue on micafungin for now. Last imagine of abdomen on 12/21 showed peritonitis with free fluid collection of 5.2 x 4.2cm but not loculated. Recommend repeat abd/pelvic CT if she is stable to transfer  [facility], [facility] Diseases Cell: [phone] Pager: [phone]  10/08/2014, 9:15 AM

## 2014-10-08 NOTE — Progress Notes (Signed)
Technical Description:  - CPR performance duration:  2 minutes   - Was defibrillation or cardioversion used?   NO   - Was external pacer placed? No  - Was patient intubated pre/post CPR? Yes    Post CPR evaluation:  - Final Status - Was patient successfully resuscitated ? Yes - What is current rhythm?  Sinus tach - What is current hemodynamic status?  Good BP  Miscellaneous Information:  - Labs sent, including: CMET,  ABG,trop x 1  - Primary team notified?    - Family Notified? Yes  - Additional notes/ transfer status:  Severe vocal cord edema complicating airway, 7.0 ETT palced with glidescope with prompt return of pulse   [name]. MD

## 2014-10-08 NOTE — Progress Notes (Signed)
Spoke with staff RN about patient's blood sugars today. CBGs today  230-310 mg/dl were covered with Novolog SENSITIVE correction scale.  Recommend increasing Novolog correction scale to REISISTANT every 4 hours especially with continuous tube feedings.  Will continue to follow while in hospital. Harvel Ricks RN BSN CDE

## 2014-10-08 NOTE — Procedures (Signed)
Intubation Procedure Note [name] [id number] [birth year]  Procedure: Intubation Indications: Respiratory insufficiency  Procedure Details Consent: Risks of procedure as well as the alternatives and risks of each were explained to the (patient/caregiver).  Consent for procedure obtained. Time Out: Verified patient identification, verified procedure, site/side was marked, verified correct patient position, special equipment/implants available, medications/allergies/relevent history reviewed, required imaging and test results available.  Performed  Maximum sterile technique was used including gloves, gown, hand hygiene and mask.  MAC and 3  Versed 4 mg fent 200 mcg Etomidate 20 mg Rocuronium 50 mcg  Evaluation Hemodynamic Status: Transient hypotension treated with pressors; O2 sats: transiently fell during during procedure Patient's Current Condition: stable Complications: Complications of hypoxia, bradycardia, brief loss of pulse requiring epi x1 & CPR before airway obtained by glidescope Patient did not tolerate procedure well. Chest X-ray ordered to verify placement.  CXR: pending.   ALVA,RAKESH V. 10/08/2014

## 2014-10-08 NOTE — Code Documentation (Signed)
Responded to Code [employer].  Compressions were being performed and ROSC was achieved shortly after my arrival. Dr. Elsworth Soho, ICU attending was in the process of intubating the patient. Intubation was achieved and MD code team was dismissed by Dr. Elsworth Soho.  Archie Patten, MD [facility] Resident  10/08/2014, 9:46 AM

## 2014-10-08 NOTE — Progress Notes (Signed)
PULMONARY / CRITICAL CARE MEDICINE HISTORY AND PHYSICAL EXAMINATION   Name: [name] MRN: [id number] DOB: July 08, 1966    ADMISSION DATE:  09/26/2014  PRIMARY SERVICE: PCCM  CHIEF COMPLAINT:  Abd pain  BRIEF PATIENT DESCRIPTION: 11yof smoker with PMH of MS, depression presents with abd pain, found to have pneumoperitoneum 2/2 small bowel rupture, also with new cold L hand.  Required surgery  for DU perf & left brachial bypass, fungemia noted on admission cultures  SIGNIFICANT EVENTS / STUDIES:  09/26/14: Admitted, CT Abd with pneumoperitoneum, cold L hand concerning for arterial clot as well. 12/20 > ex-lap [name]) with omental patch for perforated prepyloric gastric ulcer; L brachial and ulnar artery embolectomy ([facility]) 12/20 LE doppler >>neg 12/20 >>Left brachial to left radial artery bypass with left leg greater saphenous vein for Recurrent occlusion  12/20 Self extubated - had to be reintubated 12/21 echo >>nml LVEF 12/26 CT abdomen >> pelvic fluid 12/28 TEE neg  LINES / TUBES: R IJ CVL 12/20 >> 12/29 LIJ 12/28 >> JP drain RUQ 12/20 >>  ETT 12/19 >> 12/29, 12/31>>   SUBJECTIVE:  events overnight noted, resp distress on bipap, WOB remains high febrile RASS 0 ,  on restraints Good Uo   VITAL SIGNS: Temp:  [98.3 F (36.8 C)-102.6 F (39.2 C)] 101.6 F (38.7 C) (12/31 0728) Pulse Rate:  [111-132] 118 (12/31 0719) Resp:  [15-60] 46 (12/31 0719) BP: (101-157)/(53-105) 133/102 mmHg (12/31 0719) SpO2:  [72 %-100 %] 100 % (12/31 0719) FiO2 (%):  [40 %] 40 % (12/31 0719) HEMODYNAMICS:    Vent Mode:  [-] BIPAP FiO2 (%):  [40 %] 40 % Set Rate:  [15 bmp-20 bmp] 20 bmp PEEP:  [5 cmH20-8 cmH20] 8 cmH20 Pressure Support:  [10 cmH20] 10 cmH20 INTAKE / OUTPUT: Intake/Output      12/30 0701 - 12/31 0700 12/31 0701 - 01/01 0700   I.V. (mL/kg) 512.6 (7.6)    NG/GT 1340    IV Piggyback 100    TPN 929    Total Intake(mL/kg) 2881.6 (42.9)    Urine (mL/kg/hr) 1270  (0.8)    Drains 10 (0)    Stool     Total Output 1280     Net +1601.6          Stool Occurrence 2 x      PHYSICAL EXAMINATION:  Gen: RASS +1 to -2 HEENT: WNL PULM: Clear anteriorly, decreased BS BL CV: Reg, no  AB: absent BS, surgical dressing Ext: warm, no edema Neuro: no focal deficits, follows commands  LABS: I have reviewed all of today's lab results. Relevant abnormalities are discussed in the A/P section  CXR: edema pattern, improved, LLL infx  ASSESSMENT / PLAN:  Principal Problem:   Pneumoperitoneum Active Problems:   Sepsis   Altered mental state   Brachial artery occlusion   Acute respiratory failure with hypoxia   Severe sepsis   Perforated gastric ulcer   Acute respiratory failure   Artery occlusion   Blood poisoning   Candidemia   Screen for STD (sexually transmitted disease)   Abdominal abscess   Septic thrombophlebitis   Elevated WBCs   Fever   Limb ischemia   HCAP (healthcare-associated pneumonia)   PULMONARY A: ETT 12/19 >>12/29, 12/31 >>  Acute respiratory failure due to Pulm infiltrates -edema, resolved Difficult airway 12/31 due to cord edema P:  Reintubated- PRVC/keep RR 20 or lower to avoid autoPEEP Plan for trach here   CARDIOVASCULAR A: Sinus tachycardia, reactive Hypertension,  controlled  brachial artery occlusion, s/p bypass, TEE neg P:   Cont labetalol prn ct lovenox therapeutic - coumadin vs NoAc eventually? -need vascular input   RENAL A: AKI, resolved Hypokalemia/hypophos P:   Monitor BMET intermittently Monitor I/Os Correct electrolytes as indicated Lasix daily while on TNA for equal balance  GASTROINTESTINAL A: Perforated gastric ulcer and pneumoperitoneum  Post laparotomy P:   Post op care per surgery Cont TPN, TFs at goal, dc TNA in24 h Cont SUP  HEMATOLOGIC A: mild thrombocytopenia -resolved, now high count P:   lovenox Rx doses Monitor CBC intermittently Transfuse per usual ICU  guidelines  INFECTIOUS A: Peritonitis after bowel perforation Severe sepsis candidemia-unclear source Persistent fever, rising WC P:   Blood cx: 09/26/14-->  NGTD, 12/24 >> ng 12/20 peritoneal fluid >> few yeast  Vanc: 12/19 >> 12/25 Zosyn:  12/19 >> 12/25 Micafungin 12/23 >>   ID following ,-doubt line sepsis since cx on admit growing yeast, rpt CX 12/24 shows clearing Pelvic fluid -doubt Need for IR  Drain   ENDOCRINE A:  Hypoglycemia, resolved Intermittent hyperglycemia P:   Cont SSI -resistant  NEUROLOGIC A: Post op pain Acute encephalopathy/ delerium P:   RASS goal 0 Resume home SSRI and  Clonazepam Haldol prn   Social / Family: domestic partner mark updated  TODAY'S SUMMARY: Reintubated , difficutl airway due to cord edema with brief loss of pulse - will need trach here   The patient is critically ill with multiple organ systems failure and requires high complexity decision making for assessment and support, frequent evaluation and titration of therapies, application of advanced monitoring technologies and extensive interpretation of multiple databases. Critical Care Time devoted to patient care services described in this note independent of APP time is 60 minutes.    [name] MD  10/08/2014, 9:28 AM

## 2014-10-08 NOTE — Progress Notes (Addendum)
Patient noted to have increased work of breathing, resp rate into upper 40s and 50s.  PRN Haldol 1mg  given at 0050. Coached her to slow her breathing down. No change noted. At 0120 an additional 1.5mg  of Haldol given. Resp rate now in upper 50s, loud raspy/grunting nose noted on expiration. Lungs sound diminished and tight. Dr. Jimmy Footman camera in room. Additional PRN breathing treatment x2 given. Minimal change in status noted. Resp rate continued to be high. Patient denies she is anxious or having difficulty breathing. ABG and chest xray completed. ABG ok, xray unchanged from previous.  Ativan 0.5mg  administered at 0131. Vitals remain the same. Resp rate as high as 60. At 0203 2mg  Morphine administered. Montey Hora PA now at bedside. Racemic epi administered at 0207. Post race-epi raspy/grunting noise slightly decreased. Additonal 1mg  Ativan given at 0222. Patient beginning to be sleepy, but respiratory rate remains in upper 40s-50s. Placed her on bipap. Tolerating well at this time. BP 119/74, resp rate 45, sat 100% on 40% bipap, HR 117, sinus tach.  Raspy/grunting noise still audible but less pronounced than earlier at this time. Will continue to closely monitor. Richarda Blade RN

## 2014-10-08 NOTE — Progress Notes (Signed)
1)    Recent Labs Lab 10/08/14 1645 10/08/14 1835  TROPONINI 1.53* 1.73*   EKG normal  Plan  - monitor on lovenox Rx dose already on it   2) TPN ordere expired  -re-ordered  Dr. Brand Males, M.D., [facility].C.P Pulmonary and Critical Care Medicine Staff Physician [facility] Pager: [phone], If no answer or between  15:00h - 7:00h: call [phone]  10/08/2014 9:10 PM

## 2014-10-08 NOTE — Progress Notes (Signed)
Tx. Given per MD. MD present.

## 2014-10-08 NOTE — Progress Notes (Signed)
SLP Cancellation Note  Patient Details Name: [name] MRN: [id number] DOB: [birth year]   Cancelled treatment:       Reason Eval/Treat Not Completed: Medical issues which prohibited therapy. Will sign off, please reorder when pt ready.    Ivadell Gaul, Katherene Ponto 10/08/2014, 11:00 AM

## 2014-10-08 NOTE — Progress Notes (Addendum)
Patient ID: [name], female   DOB: 20-Jun-1966, 48 y.o.   MRN: [id number] 11 Days Post-Op  Subjective: Delirium, c/o pain when asked directly.  Placed on bipap for dyspnea.  Objective: Vital signs in last 24 hours: Temp:  [98.3 F (36.8 C)-102.6 F (39.2 C)] 101.6 F (38.7 C) (12/31 0728) Pulse Rate:  [107-132] 118 (12/31 0719) Resp:  [15-60] 46 (12/31 0719) BP: (101-157)/(53-105) 133/102 mmHg (12/31 0719) SpO2:  [72 %-100 %] 100 % (12/31 0719) FiO2 (%):  [40 %] 40 % (12/31 0719) Last BM Date: 10/06/14  Intake/Output from previous day: 12/30 0701 - 12/31 0700 In: 2881.6 [I.V.:512.6; NG/GT:1340; IV Piggyback:100; TPN:929] Out: 1280 [Urine:1270; Drains:10] Intake/Output this shift:    General appearance: delirious Resp: clear to auscultation bilaterally and Erhard O2 Cardio: tachy 120 GI: soft, NT, wound clean, JP serous Neuro: disoriented and delirious  Lab Results:   Recent Labs  10/07/14 0500 10/08/14 0425  WBC 20.9* 23.5*  HGB 10.2* 10.3*  HCT 33.2* 34.1*  PLT 764* 854*   BMET  Recent Labs  10/07/14 0500 10/08/14 0425  NA 139 144  K 3.6 3.5  CL 92* 101  CO2 36* 35*  GLUCOSE 208* 246*  BUN 11 17  CREATININE 0.68 0.69  CALCIUM 8.5 8.3*   PT/INR No results for input(s): LABPROT, INR in the last 72 hours. ABG  Recent Labs  10/08/14 0154 10/08/14 0601  PHART 7.430 7.420  HCO3 36.1* 35.2*    Studies/Results: Dg Chest Port 1 View  10/08/2014   CLINICAL DATA:  Respiratory distress  EXAM: PORTABLE CHEST - 1 VIEW  COMPARISON:  10/07/2014  FINDINGS: Stable positioning of left IJ catheter, tip at the SVC. A nasogastric tube remains in the stomach.  Unchanged left more than right base or lung opacities. Although these have a streaky appearance, they appeared more typical of airspace disease on abdominal CT 10/03/2014. No edema, significant effusion, or pneumothorax.  IMPRESSION: Unchanged bibasilar atelectasis or pneumonia, left worse than right.    Electronically Signed   By: Jorje Guild M.D.   On: 10/08/2014 02:32   Dg Chest Port 1 View  10/07/2014   CLINICAL DATA:  Respiratory failure.  EXAM: PORTABLE CHEST - 1 VIEW  COMPARISON:  12/15/2013.  FINDINGS: Interim extubation. Left IJ line NG tube in stable position. Persistent bibasilar atelectasis. Heart size normal. No pleural effusion or pneumothorax. Prior cervical spine fusion.  IMPRESSION: 1. Interim extubation left IJ line and NG tube in stable position. 2. Persistent bibasilar atelectasis.   Electronically Signed   By: Marcello Moores  Register   On: 10/07/2014 07:48    Anti-infectives: Anti-infectives    Start     Dose/Rate Route Frequency Ordered Stop   09/30/14 1300  micafungin (MYCAMINE) 100 mg in sodium chloride 0.9 % 100 mL IVPB    Comments:  Pharmacy may adjust as needed   100 mg100 mL/hr over 1 Hours Intravenous Every 24 hours 09/30/14 1213     09/27/14 2100  ceFEPIme (MAXIPIME) 2 g in dextrose 5 % 50 mL IVPB  Status:  Discontinued     2 g100 mL/hr over 30 Minutes Intravenous Every 24 hours 09/27/14 0603 09/27/14 1103   09/27/14 2000  vancomycin (VANCOCIN) IVPB 1000 mg/200 mL premix  Status:  Discontinued     1,000 mg200 mL/hr over 60 Minutes Intravenous Every 24 hours 09/27/14 0603 09/27/14 1103   09/27/14 1400  vancomycin (VANCOCIN) IVPB 1000 mg/200 mL premix  Status:  Discontinued  1,000 mg200 mL/hr over 60 Minutes Intravenous Every 12 hours 09/27/14 1103 10/02/14 1348   09/27/14 1200  ceFEPIme (MAXIPIME) 2 g in dextrose 5 % 50 mL IVPB  Status:  Discontinued     2 g100 mL/hr over 30 Minutes Intravenous Every 12 hours 09/27/14 1103 10/02/14 1348   09/26/14 1930  vancomycin (VANCOCIN) IVPB 1000 mg/200 mL premix     1,000 mg200 mL/hr over 60 Minutes Intravenous  Once 09/26/14 1917 09/26/14 2210   09/26/14 1930  ceFEPIme (MAXIPIME) 2 g in dextrose 5 % 50 mL IVPB     2 g100 mL/hr over 30 Minutes Intravenous  Once 09/26/14 1917 09/26/14 2132      Assessment/Plan: s/p  Procedure(s): Left Radial, Brachial, and Ulnar Thrombectomy; Left Brachial to Radial Bypass Graft using Greater Saphenous vein graft from Left Thigh; Left Saphenous Vein Harvest; Intraoperative Arteriogram; Intra-arterial administration of TPA (Left) S/P repair perf pre-pyloric ulcer - TF at goal, if tolerates and drain remains benign will remove drain in AM resp failure - may need reintubation, defer to CCM.   Fungemia - appreciate ID assist, Micafungin WBC back up.  ? Some pelvic fluid undrained by JP.  Will discuss.  Not sure that this is source of fever.  May decide to repeat scan.  Pt would need to be prone for drainage.   S/P thrombectomy LUE and L brachial to radial bypass graft- Lovenox per [facility]VS Delirium - will defer to CCM, consider precedex DIspo - PT/OT eval, LTACH is being considered  LOS: 12 days    [facility] 10/08/2014

## 2014-10-08 NOTE — Progress Notes (Signed)
Placed patient on BIPAP due to increased respiratory rate. CCM at bedside, will continue to monitor patient.

## 2014-10-09 ENCOUNTER — Inpatient Hospital Stay (HOSPITAL_COMMUNITY): Payer: BLUE CROSS/BLUE SHIELD

## 2014-10-09 ENCOUNTER — Encounter (HOSPITAL_COMMUNITY): Payer: Self-pay | Admitting: Radiology

## 2014-10-09 DIAGNOSIS — R509 Fever, unspecified: Secondary | ICD-10-CM

## 2014-10-09 DIAGNOSIS — A419 Sepsis, unspecified organism: Secondary | ICD-10-CM | POA: Insufficient documentation

## 2014-10-09 DIAGNOSIS — B377 Candidal sepsis: Principal | ICD-10-CM

## 2014-10-09 DIAGNOSIS — K651 Peritoneal abscess: Secondary | ICD-10-CM

## 2014-10-09 DIAGNOSIS — B49 Unspecified mycosis: Secondary | ICD-10-CM

## 2014-10-09 DIAGNOSIS — G9340 Encephalopathy, unspecified: Secondary | ICD-10-CM

## 2014-10-09 DIAGNOSIS — R6521 Severe sepsis with septic shock: Secondary | ICD-10-CM

## 2014-10-09 LAB — BASIC METABOLIC PANEL
Anion gap: 13 (ref 5–15)
BUN: 20 mg/dL (ref 6–23)
CO2: 27 mmol/L (ref 19–32)
Calcium: 8.3 mg/dL — ABNORMAL LOW (ref 8.4–10.5)
Chloride: 108 mEq/L (ref 96–112)
Creatinine, Ser: 0.96 mg/dL (ref 0.50–1.10)
GFR calc non Af Amer: 68 mL/min — ABNORMAL LOW (ref >90)
GFR, EST AFRICAN AMERICAN: 79 mL/min — AB (ref >90)
GLUCOSE: 144 mg/dL — AB (ref 70–99)
Potassium: 4.3 mmol/L (ref 3.5–5.1)
Sodium: 148 mmol/L — ABNORMAL HIGH (ref 135–145)

## 2014-10-09 LAB — CBC WITH DIFFERENTIAL/PLATELET
BASOS ABS: 0 10*3/uL (ref 0.0–0.1)
Basophils Relative: 0 % (ref 0–1)
Eosinophils Absolute: 0 10*3/uL (ref 0.0–0.7)
Eosinophils Relative: 0 % (ref 0–5)
HCT: 37 % (ref 36.0–46.0)
HEMOGLOBIN: 11.5 g/dL — AB (ref 12.0–15.0)
LYMPHS ABS: 3.8 10*3/uL (ref 0.7–4.0)
Lymphocytes Relative: 13 % (ref 12–46)
MCH: 31.7 pg (ref 26.0–34.0)
MCHC: 31.1 g/dL (ref 30.0–36.0)
MCV: 101.9 fL — ABNORMAL HIGH (ref 78.0–100.0)
MONO ABS: 2.9 10*3/uL — AB (ref 0.1–1.0)
Monocytes Relative: 10 % (ref 3–12)
NEUTROS ABS: 22.5 10*3/uL — AB (ref 1.7–7.7)
NEUTROS PCT: 77 % (ref 43–77)
PLATELETS: 615 10*3/uL — AB (ref 150–400)
RBC: 3.63 MIL/uL — ABNORMAL LOW (ref 3.87–5.11)
RDW: 14.8 % (ref 11.5–15.5)
SMEAR REVIEW: ADEQUATE
WBC: 29.2 10*3/uL — AB (ref 4.0–10.5)

## 2014-10-09 LAB — GLUCOSE, CAPILLARY
GLUCOSE-CAPILLARY: 131 mg/dL — AB (ref 70–99)
GLUCOSE-CAPILLARY: 142 mg/dL — AB (ref 70–99)
GLUCOSE-CAPILLARY: 161 mg/dL — AB (ref 70–99)
GLUCOSE-CAPILLARY: 163 mg/dL — AB (ref 70–99)
GLUCOSE-CAPILLARY: 283 mg/dL — AB (ref 70–99)
GLUCOSE-CAPILLARY: 39 mg/dL — AB (ref 70–99)
Glucose-Capillary: 229 mg/dL — ABNORMAL HIGH (ref 70–99)
Glucose-Capillary: 126 mg/dL — ABNORMAL HIGH (ref 70–99)
Glucose-Capillary: 64 mg/dL — ABNORMAL LOW (ref 70–99)
Glucose-Capillary: 315 mg/dL — ABNORMAL HIGH (ref 70–99)

## 2014-10-09 LAB — POCT I-STAT 3, ART BLOOD GAS (G3+)
ACID-BASE EXCESS: 1 mmol/L (ref 0.0–2.0)
Acid-base deficit: 1 mmol/L (ref 0.0–2.0)
Bicarbonate: 22.9 mEq/L (ref 20.0–24.0)
Bicarbonate: 23.6 mEq/L (ref 20.0–24.0)
O2 SAT: 95 %
O2 SAT: 97 %
PCO2 ART: 41.9 mmHg (ref 35.0–45.0)
PH ART: 7.369 (ref 7.350–7.450)
Patient temperature: 103.3
TCO2: 24 mmol/L (ref 0–100)
TCO2: 25 mmol/L (ref 0–100)
pCO2 arterial: 28.8 mmHg — ABNORMAL LOW (ref 35.0–45.0)
pH, Arterial: 7.515 — ABNORMAL HIGH (ref 7.350–7.450)
pO2, Arterial: 75 mmHg — ABNORMAL LOW (ref 80.0–100.0)
pO2, Arterial: 106 mmHg — ABNORMAL HIGH (ref 80.0–100.0)

## 2014-10-09 LAB — CARBOXYHEMOGLOBIN
Carboxyhemoglobin: 0.6 % (ref 0.5–1.5)
METHEMOGLOBIN: 1.6 % — AB (ref 0.0–1.5)
O2 Saturation: 68.1 %
Total hemoglobin: 11.6 g/dL — ABNORMAL LOW (ref 12.0–16.0)

## 2014-10-09 LAB — PROCALCITONIN: PROCALCITONIN: 31.7 ng/mL

## 2014-10-09 LAB — TROPONIN I
Troponin I: 3.66 ng/mL (ref <0.031)
Troponin I: 2.31 ng/mL (ref <0.031)

## 2014-10-09 LAB — PHOSPHORUS: Phosphorus: 3.3 mg/dL (ref 2.3–4.6)

## 2014-10-09 LAB — MAGNESIUM: Magnesium: 2.2 mg/dL (ref 1.5–2.5)

## 2014-10-09 MED ORDER — SODIUM CHLORIDE 0.9 % IV SOLN
1.0000 mg/h | INTRAVENOUS | Status: DC
  Administered 2014-10-10: 0.5 mg/h via INTRAVENOUS
  Administered 2014-10-13: 4 mg/h via INTRAVENOUS
  Administered 2014-10-14 – 2014-10-15 (×2): 2 mg/h via INTRAVENOUS
  Filled 2014-10-09 (×8): qty 10

## 2014-10-09 MED ORDER — SODIUM CHLORIDE 0.9 % IV BOLUS (SEPSIS)
1000.0000 mL | Freq: Once | INTRAVENOUS | Status: AC
  Administered 2014-10-09: 1000 mL via INTRAVENOUS

## 2014-10-09 MED ORDER — IOHEXOL 300 MG/ML  SOLN: 80.0000 mL | Freq: Once | INTRAMUSCULAR | Status: AC | PRN

## 2014-10-09 MED ORDER — MIDAZOLAM HCL 2 MG/2ML IJ SOLN
1.0000 mg | INTRAMUSCULAR | Status: DC | PRN
  Administered 2014-10-09 (×2): 1 mg via INTRAVENOUS
  Administered 2014-10-09: 2 mg via INTRAVENOUS
  Administered 2014-10-10 (×2): 1 mg via INTRAVENOUS
  Administered 2014-10-11: 0.5 mg via INTRAVENOUS
  Filled 2014-10-09 (×3): qty 2

## 2014-10-09 MED ORDER — SODIUM CHLORIDE 0.9 % IV SOLN
1.0000 g | Freq: Three times a day (TID) | INTRAVENOUS | Status: DC
  Administered 2014-10-09 – 2014-10-11 (×6): 1 g via INTRAVENOUS
  Filled 2014-10-09 (×8): qty 1

## 2014-10-09 MED ORDER — TRACE MINERALS CR-CU-F-FE-I-MN-MO-SE-ZN IV SOLN
INTRAVENOUS | Status: AC
  Administered 2014-10-09: 17:00:00 via INTRAVENOUS
  Filled 2014-10-09: qty 2000

## 2014-10-09 MED ORDER — IOHEXOL 300 MG/ML  SOLN
25.0000 mL | INTRAMUSCULAR | Status: AC
  Administered 2014-10-09 (×2): 25 mL via ORAL

## 2014-10-09 MED ORDER — FAT EMULSION 20 % IV EMUL
240.0000 mL | INTRAVENOUS | Status: AC
  Administered 2014-10-09: 240 mL via INTRAVENOUS
  Filled 2014-10-09: qty 250

## 2014-10-09 MED ORDER — VASOPRESSIN 20 UNIT/ML IV SOLN
0.0300 [IU]/min | INTRAVENOUS | Status: DC
  Administered 2014-10-09: 0.03 [IU]/min via INTRAVENOUS
  Filled 2014-10-09 (×3): qty 2

## 2014-10-09 MED ORDER — NOREPINEPHRINE BITARTRATE 1 MG/ML IV SOLN
2.0000 ug/min | INTRAVENOUS | Status: DC
  Administered 2014-10-09: 25 ug/min via INTRAVENOUS
  Administered 2014-10-09: 2 ug/min via INTRAVENOUS
  Administered 2014-10-09 – 2014-10-10 (×6): 25 ug/min via INTRAVENOUS
  Filled 2014-10-09 (×8): qty 4

## 2014-10-09 MED ORDER — VANCOMYCIN HCL IN DEXTROSE 1-5 GM/200ML-% IV SOLN
1000.0000 mg | Freq: Two times a day (BID) | INTRAVENOUS | Status: DC
  Administered 2014-10-09 – 2014-10-12 (×6): 1000 mg via INTRAVENOUS
  Filled 2014-10-09 (×9): qty 200

## 2014-10-09 NOTE — Progress Notes (Signed)
CT done.  No intraabdominal source for hemodynamic instability.

## 2014-10-09 NOTE — Progress Notes (Signed)
Pt taken to and from CT on ventilator from 2M12. Pt stable throughout with no complications.

## 2014-10-09 NOTE — Progress Notes (Signed)
Transported pt to and from CT on ventilator. Pt stable throughout. No complications.

## 2014-10-09 NOTE — Progress Notes (Signed)
[facility]  Subjective  - on vent not really responsive   Objective 102/68 96 102.2 F (39 C) (Axillary) 29 100%  Intake/Output Summary (Last 24 hours) at 10/09/14 0934 Last data filed at 10/09/14 0900  Gross per 24 hour  Intake 2652.5 ml  Output   2885 ml  Net -232.5 ml   Left hand ulcers on digits but hand is warm with healing radial and brachial incisions Right hand cool dusky digit tips but ulnar radial doppler flow  On levophed  Assessment/Planning: Worsening overall picture now requiring pressors and resp failure Continue to follow upper extremities but at this point watchful waiting really only option in light of deterioration over last day  Whitaker,Tammy E 10/09/2014 9:34 AM --  Laboratory Lab Results:  Recent Labs  10/07/14 0500 10/08/14 0425  WBC 20.9* 23.5*  HGB 10.2* 10.3*  HCT 33.2* 34.1*  PLT 764* 854*   BMET  Recent Labs  10/08/14 1645 10/09/14 0230  NA 148* 148*  K 2.8* 4.3  CL 103 108  CO2 35* 27  GLUCOSE 144* 144*  BUN 19 20  CREATININE 0.91 0.96  CALCIUM 7.8* 8.3*    COAG Lab Results  Component Value Date   INR 1.25 10/05/2014   No results found for: PTT

## 2014-10-09 NOTE — Progress Notes (Signed)
12 Days Post-Op  Subjective: Back on vent.  Had cardiac arrest yesterday and now appears in septic shock. On support.   Objective: Vital signs in last 24 hours: Temp:  [100.2 F (37.9 C)-103.3 F (39.6 C)] 102.2 F (39 C) (01/01 0757) Pulse Rate:  [96-147] 96 (01/01 0830) Resp:  [18-44] 29 (01/01 0900) BP: (77-177)/(14-124) 102/68 mmHg (01/01 0900) SpO2:  [59 %-100 %] 100 % (01/01 0900) FiO2 (%):  [80 %-100 %] 100 % (01/01 0900) Weight:  [145 lb 4.5 oz (65.9 kg)] 145 lb 4.5 oz (65.9 kg) (01/01 0500) Last BM Date: 10/08/14  Intake/Output from previous day: 12/31 0701 - 01/01 0700 In: 1780.2 [I.V.:540.2; NG/GT:170; IV Piggyback:650; TPN:420] Out: 3085 [Urine:2375; Emesis/NG output:700; Drains:10] Intake/Output this shift: Total I/O In: 1092.5 [I.V.:62.5; NG/GT:30; IV Piggyback:1000] Out: -   Incision/Wound:clean wound.  JP serous and minimal   Lab Results:   Recent Labs  10/07/14 0500 10/08/14 0425  WBC 20.9* 23.5*  HGB 10.2* 10.3*  HCT 33.2* 34.1*  PLT 764* 854*   BMET  Recent Labs  10/08/14 1645 10/09/14 0230  NA 148* 148*  K 2.8* 4.3  CL 103 108  CO2 35* 27  GLUCOSE 144* 144*  BUN 19 20  CREATININE 0.91 0.96  CALCIUM 7.8* 8.3*   PT/INR No results for input(s): LABPROT, INR in the last 72 hours. ABG  Recent Labs  10/08/14 1051 10/09/14 0847  PHART 7.501* 7.515*  HCO3 34.2* 22.9    Studies/Results: Ct Head Wo Contrast  10/08/2014   CLINICAL DATA:  Cardiac arrest. Now with blown pupil. Initial evaluation.  EXAM: CT HEAD WITHOUT CONTRAST  TECHNIQUE: Contiguous axial images were obtained from the base of the skull through the vertex without intravenous contrast.  COMPARISON:  Prior MRI from 04/03/2014 as well as earlier studies.  FINDINGS: Cerebral volume is stable.  Focal hypodense lesions involving the globus pallidum bilaterally are stable relative to previous studies. Basal ganglia otherwise symmetric and normal in appearance. Gray-white matter  differentiation is maintained. No evidence for anoxia at this time. No large vessel or cortical infarct identified. No new encephalomalacia.  No mass lesion or midline shift. No hydrocephalus. There is no extra-axial fluid collection.  Scalp soft tissues within normal limits. No acute abnormality seen about the orbits.  Nasogastric tube partially visualized. Mild opacity noted within the posterior left sphenoid sinus. Scattered opacity noted within the right mastoid air cells.  Calvarium intact.  IMPRESSION: 1. Stable appearance of the brain with no new acute intracranial abnormality identified. 2. 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   Electronically Signed   By: Jeannine Boga M.D.   On: 10/08/2014 17:53   Portable Chest Xray  10/09/2014   CLINICAL DATA:  Acute respiratory failure.  EXAM: PORTABLE CHEST - 1 VIEW  COMPARISON:  10/08/2014  FINDINGS: Consolidation noted in the left lower lobe, not significantly changed since prior study. Improving right basilar opacity, likely improving atelectasis. Support devices are unchanged. Heart is normal size.  IMPRESSION: Stable left lower lobe atelectasis or infiltrate. Improving right basilar aeration.   Electronically Signed   By: Rolm Baptise M.D.   On: 10/09/2014 07:35   Dg Chest Port 1 View  10/08/2014   CLINICAL DATA:  Cardiac arrest.  Respiratory distress.  EXAM: PORTABLE CHEST - 1 VIEW  COMPARISON:  Earlier the same day at 01:47 hr  FINDINGS: 0932 hr: Interval placement of endotracheal tube 4.4 cm from the carina. The left internal jugular central line is  been minimally retracted, tip remains in the proximal SVC. Enteric tube in place, tip and side port below the diaphragm. There is increasing pleural parenchymal opacity at the left lung base. Unchanged linear opacities at the right lung base. Cardiomediastinal contours are unchanged. No evidence of pneumothorax.  IMPRESSION: 1. Endotracheal tube 4.4 cm from the carina. Left  internal jugular venous catheter mildly retracted from prior, tip remains in the proximal SVC. 2. Increasing left basilar pleural parenchymal opacity.   Electronically Signed   By: Jeb Levering M.D.   On: 10/08/2014 10:02   Dg Chest Port 1 View  10/08/2014   CLINICAL DATA:  Respiratory distress  EXAM: PORTABLE CHEST - 1 VIEW  COMPARISON:  10/07/2014  FINDINGS: Stable positioning of left IJ catheter, tip at the SVC. A nasogastric tube remains in the stomach.  Unchanged left more than right base or lung opacities. Although these have a streaky appearance, they appeared more typical of airspace disease on abdominal CT 10/03/2014. No edema, significant effusion, or pneumothorax.  IMPRESSION: Unchanged bibasilar atelectasis or pneumonia, left worse than right.   Electronically Signed   By: Jorje Guild M.D.   On: 10/08/2014 02:32    Anti-infectives: Anti-infectives    Start     Dose/Rate Route Frequency Ordered Stop   10/09/14 1200  cefTRIAXone (ROCEPHIN) 1 g in dextrose 5 % 50 mL IVPB - Premix     1 g100 mL/hr over 30 Minutes Intravenous Every 24 hours 10/08/14 1519     10/08/14 2200  metroNIDAZOLE (FLAGYL) IVPB 500 mg     500 mg100 mL/hr over 60 Minutes Intravenous 3 times per day 10/08/14 1520     10/08/14 1400  metroNIDAZOLE (FLAGYL) IVPB 500 mg  Status:  Discontinued     500 mg100 mL/hr over 60 Minutes Intravenous 3 times per day 10/08/14 0945 10/08/14 1516   10/08/14 1000  cefTRIAXone (ROCEPHIN) 1 g in dextrose 5 % 50 mL IVPB - Premix  Status:  Discontinued     1 g100 mL/hr over 30 Minutes Intravenous Every 24 hours 10/08/14 0945 10/08/14 1516   09/30/14 1300  micafungin (MYCAMINE) 100 mg in sodium chloride 0.9 % 100 mL IVPB    Comments:  Pharmacy may adjust as needed   100 mg100 mL/hr over 1 Hours Intravenous Every 24 hours 09/30/14 1213     09/27/14 2100  ceFEPIme (MAXIPIME) 2 g in dextrose 5 % 50 mL IVPB  Status:  Discontinued     2 g100 mL/hr over 30 Minutes Intravenous Every 24  hours 09/27/14 0603 09/27/14 1103   09/27/14 2000  vancomycin (VANCOCIN) IVPB 1000 mg/200 mL premix  Status:  Discontinued     1,000 mg200 mL/hr over 60 Minutes Intravenous Every 24 hours 09/27/14 0603 09/27/14 1103   09/27/14 1400  vancomycin (VANCOCIN) IVPB 1000 mg/200 mL premix  Status:  Discontinued     1,000 mg200 mL/hr over 60 Minutes Intravenous Every 12 hours 09/27/14 1103 10/02/14 1348   09/27/14 1200  ceFEPIme (MAXIPIME) 2 g in dextrose 5 % 50 mL IVPB  Status:  Discontinued     2 g100 mL/hr over 30 Minutes Intravenous Every 12 hours 09/27/14 1103 10/02/14 1348   09/26/14 1930  vancomycin (VANCOCIN) IVPB 1000 mg/200 mL premix     1,000 mg200 mL/hr over 60 Minutes Intravenous  Once 09/26/14 1917 09/26/14 2210   09/26/14 1930  ceFEPIme (MAXIPIME) 2 g in dextrose 5 % 50 mL IVPB     2 g100 mL/hr over 30  Minutes Intravenous  Once 09/26/14 1917 09/26/14 2132      Assessment/Plan: Patient Active Problem List   Diagnosis Date Noted  . Septic shock   . Limb ischemia   . HCAP (healthcare-associated pneumonia)   . Elevated WBCs   . Fever   . Abdominal abscess   . Septic thrombophlebitis   . Acute respiratory failure   . Artery occlusion   . Blood poisoning   . Candidemia   . Screen for STD (sexually transmitted disease)   . 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 Acute respiratory failure with hypoxia 09/27/2014  . Severe sepsis 09/27/2014  . Perforated gastric ulcer 09/27/2014  . Sepsis 09/26/2014  . Altered mental state 09/26/2014  . Pneumoperitoneum 09/26/2014  . Prediabetes 07/23/2014  . Unspecified hereditary and idiopathic peripheral neuropathy 04/15/2014  . Overdose 04/02/2014  . CVA (cerebral infarction) 04/02/2014  . Leukocytosis 04/02/2014  . Hypertriglyceridemia 12/11/2013  . Stiffness of joint, not elsewhere classified, ankle and foot 11/12/2013  . Lack of coordination 11/03/2013  . Pernicious anemia 09/12/2013  . Hyperglycemia 09/12/2013  . Elevated  transaminase level 09/12/2013  . Weakness 09/12/2013  . Ataxia 09/09/2013  . Difficulty in walking(719.7) 09/09/2013  . Weakness of both legs 09/09/2013  . UTI (urinary tract infection) 02/11/2013  . Generalized weakness 02/11/2013  . Tobacco abuse 01/14/2013  . Bilateral leg edema 01/14/2013  . Pedal edema 01/13/2013  . Chronic pain syndrome 12/30/2012  . Reactive airway disease 12/30/2012  . Encephalopathy acute 07/25/2011  . 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   Condition much worse after events of last 24 hours.  Going to CT to evaluate abdomen and pelvis.  If a problem is found not sure reoperation will change outcome or course.  Continue supportive therapy for now but not sure she is going to survive.    LOS: 13 days    Treasa Bradshaw A. 10/09/2014

## 2014-10-09 NOTE — Progress Notes (Signed)
ANTICOAGULATION CONSULT NOTE - Follow Up Consult  Pharmacy Consult for Heparin ->Lovenox  Indication: Arterial clot s/p embolectomy   Patient Measurements: Height: 4\' 9"  (144.8 cm) Weight: 145 lb 4.5 oz (65.9 kg) IBW/kg (Calculated) : 38.6  Vital Signs: Temp: 102.2 F (39 C) (01/01 0757) Temp Source: Axillary (01/01 0757) BP: 91/69 mmHg (01/01 1000) Pulse Rate: 96 (01/01 0830)  Labs:  Recent Labs  10/07/14 0500 10/08/14 0425 10/08/14 1645 10/08/14 1835 10/09/14 0230  HGB 10.2* 10.3*  --   --   --   HCT 33.2* 34.1*  --   --   --   PLT 764* 854*  --   --   --   CREATININE 0.68 0.69 0.91  --  0.96  TROPONINI  --   --  1.53* 1.73* 3.66*    Estimated Creatinine Clearance: 55.4 mL/min (by C-G formula based on Cr of 0.96).  Assessment: 49 y.o. 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 CBC is stable and no bleeding noted. S/p thrombectomy 12/20 (received intra-arterial TPA). Dose remains appropriate for weight and renal function.   Goal of Therapy:  Anti-Xa level 0.6-1 units/ml (4 hrs post dose) Monitor platelets by anticoagulation protocol: Yes   Plan:  1. Continue lovenox 70mg  SQ Q12H 2. F/u CBC Q72H 3. F/u plans for oral anticoagulation  Salome Arnt, PharmD, BCPS Pager # [phone] 10/09/2014 10:07 AM

## 2014-10-09 NOTE — Progress Notes (Signed)
[facility]    Date of Admission:  09/26/2014   Total days of antibiotics 13        Day 8 micafungin                   ID: [name] is a 49 y.o. 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 Left brachial to left radial artery bypass with left leg non-reversed greater saphenous vein. Intra-arterial injection of TPA. Hospitalization also complicated by HCAP, VDRF s/p extubation on 12/29. She remains afebrile since 12/28. Cleared fungemia on 12/24  Principal Problem:   Pneumoperitoneum Active Problems:   Sepsis   Altered mental state   Brachial artery occlusion   Acute respiratory failure with hypoxia   Severe sepsis   Perforated gastric ulcer   Acute respiratory failure   Artery occlusion   Blood poisoning   Candidemia   Screen for STD (sexually transmitted disease)   Abdominal abscess   Septic thrombophlebitis   Elevated WBCs   Fever   Limb ischemia   HCAP (healthcare-associated pneumonia)   Septic shock    Subjective: Yesterday morning required intubation at 9am but also in setting of hypoxia, cardiac arrest received ACLS CPR  x 2 min. 1 dose of epi. During intubation, it was noted that she vomited and also had edematous vocal  Cords. Post intubation still having high fevers of 103F, started antibiotics for aspiration pneumonia however, clinically looking worse this morning concerning for septic shock, since hypotension requiring pressors, cool extremities   Persistent high fevers of 103-103.9 Tc  Medications:  . antiseptic oral rinse  7 mL Mouth Rinse QID  . chlorhexidine   15 mL Mouth Rinse BID  . enoxaparin (LOVENOX) injection  70 mg Subcutaneous Q12H  . insulin aspart  0-9 Units Subcutaneous 6 times per day  . metronidazole  500 mg Intravenous 3 times per day  . micafungin [facility]) IV  100 mg Intravenous Q24H  . pantoprazole sodium  40 mg Per Tube Q1200  . sodium chloride  10-40 mL Intracatheter Q12H    Objective: Vital signs in last 24 hours: Temp:  [100.2 F (37.9 C)-103.3 F (39.6 C)] 103.3 F (39.6 C) (01/01 1208) Pulse Rate:  [96-147] 96 (01/01 0830) Resp:  [21-44] 28 (01/01 1145) BP: (72-177)/(14-124) 72/51 mmHg (01/01 1145) SpO2:  [59 %-100 %] 100 % (01/01 1145) FiO2 (%):  [80 %-100 %] 100 % (01/01 1145) Weight:  [145 lb 4.5 oz (65.9 kg)] 145 lb 4.5 oz (65.9 kg) (01/01 0500) Physical Exam  Constitutional:  Intubated, sedated, does not open eyes to verbal stimuli. diapheretic HENT: ng in place, OETT in place Cardiovascular: tachycardic, regular rhythm and normal heart sounds. Exam reveals no gallop and no friction rub.  No murmur heard.  Pulmonary/Chest: Effort normal and breath sounds normal. No respiratory distress.  has no wheezes.  Abdominal: Soft. Bowel sounds are decreased. Abdominal bandage in place.non tender Skin: cool, mottled right hand with dusky digits. Skin incision is healing from vascular surgery   Lab Results  Recent Labs  10/08/14 0425 10/08/14 1645 10/09/14 0230 10/09/14 0949  WBC 23.5*  --   --  29.2*  HGB  10.3*  --   --  11.5*  HCT 34.1*  --   --  37.0  NA 144 148* 148*  --   K 3.5 2.8* 4.3  --   CL 101 103 108  --   CO2 35* 35* 27  --   BUN 17 19 20   --   CREATININE 0.69 0.91 0.96  --    Liver Panel  Recent Labs  10/08/14 0425 10/08/14 1645  PROT 7.8 7.3  ALBUMIN 2.3* 2.1*  AST 38* 74*  ALT 37* 53*  ALKPHOS 214* 217*  BILITOT 0.5 0.7   Sedimentation Rate No results for input(s): ESRSEDRATE in the last 72 hours. C-Reactive Protein No results for input(s): CRP in the last 72  hours.  Microbiology: 12/24 blood cx ngtd 12/19 blood cx c.glabrata 12/20 peritoneal fluid few c.glabrata Studies/Results: Ct Head Wo Contrast  10/08/2014   CLINICAL DATA:  Cardiac arrest. Now with blown pupil. Initial evaluation.  EXAM: CT HEAD WITHOUT CONTRAST  TECHNIQUE: Contiguous axial images were obtained from the base of the skull through the vertex without intravenous contrast.  COMPARISON:  Prior MRI from 04/03/2014 as well as earlier studies.  FINDINGS: Cerebral volume is stable.  Focal hypodense lesions involving the globus pallidum bilaterally are stable relative to previous studies. Basal ganglia otherwise symmetric and normal in appearance. Gray-white matter differentiation is maintained. No evidence for anoxia at this time. No large vessel or cortical infarct identified. No new encephalomalacia.  No mass lesion or midline shift. No hydrocephalus. There is no extra-axial fluid collection.  Scalp soft tissues within normal limits. No acute abnormality seen about the orbits.  Nasogastric tube partially visualized. Mild opacity noted within the posterior left sphenoid sinus. Scattered opacity noted within the right mastoid air cells.  Calvarium intact.  IMPRESSION: 1. Stable appearance of the brain with no new acute intracranial abnormality identified. 2. 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   Electronically Signed   By: Jeannine Boga M.D.   On: 10/08/2014 17:53   Portable Chest Xray  10/09/2014   CLINICAL DATA:  Acute respiratory failure.  EXAM: PORTABLE CHEST - 1 VIEW  COMPARISON:  10/08/2014  FINDINGS: Consolidation noted in the left lower lobe, not significantly changed since prior study. Improving right basilar opacity, likely improving atelectasis. Support devices are unchanged. Heart is normal size.  IMPRESSION: Stable left lower lobe atelectasis or infiltrate. Improving right basilar aeration.   Electronically Signed   By: Rolm Baptise M.D.   On:  10/09/2014 07:35   Dg Chest Port 1 View  10/08/2014   CLINICAL DATA:  Cardiac arrest.  Respiratory distress.  EXAM: PORTABLE CHEST - 1 VIEW  COMPARISON:  Earlier the same day at 01:47 hr  FINDINGS: 0932 hr: Interval placement of endotracheal tube 4.4 cm from the carina. The left internal jugular central line is been minimally retracted, tip remains in the proximal SVC. Enteric tube in place, tip and side port below the diaphragm. There is increasing pleural parenchymal opacity at the left lung base. Unchanged linear opacities at the right lung base. Cardiomediastinal contours are unchanged. No evidence of pneumothorax.  IMPRESSION: 1. Endotracheal tube 4.4 cm from the carina. Left internal jugular venous catheter mildly retracted from prior, tip remains in the proximal SVC. 2. Increasing left basilar pleural parenchymal opacity.   Electronically Signed   By: Jeb Levering M.D.   On: 10/08/2014 10:02   Dg Chest Port 1 View  10/08/2014   CLINICAL DATA:  Respiratory  distress  EXAM: PORTABLE CHEST - 1 VIEW  COMPARISON:  10/07/2014  FINDINGS: Stable positioning of left IJ catheter, tip at the SVC. A nasogastric tube remains in the stomach.  Unchanged left more than right base or lung opacities. Although these have a streaky appearance, they appeared more typical of airspace disease on abdominal CT 10/03/2014. No edema, significant effusion, or pneumothorax.  IMPRESSION: Unchanged bibasilar atelectasis or pneumonia, left worse than right.   Electronically Signed   By: Jorje Guild M.D.   On: 10/08/2014 02:32   TTE 12/21: no vegetations  Assessment/Plan:  Recurrent fevers/severe sepsis = will start meropenem and vancomycin in addition to seeing what abd/pelvis CT shows to help determine cause of recent sepsis. Poor candidate if needed to go back to or per surgery team. For now, abtx regimen would cover aspiration pna/hcap as well. Pressors and vent needs managed by critical care team.   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 Would treat with micafungin since c.glabrata can have fluconazole resistance. . Micro lab quoted that timing to get sensitivities back on c.glabrata isolate would be 2-3 wk thus would not return in time to impact decision making.  Addendum: stat abd ct looks like this is more pna process rather than GI source    Baxter Flattery [facility] Cell: [phone] Pager: [phone]  10/09/2014, 12:57 PM

## 2014-10-09 NOTE — Progress Notes (Signed)
Telephone order received to place art line on pt. Charge RT called for assistance. Left radial was where pt had prior thrombectomy and hand was noted to have extreme swelling. Right radial and hand extremely cold to touch with blue/purple digits. Dr. Lake Bells notified by phone unable to place a-line at this time. RN aware.

## 2014-10-09 NOTE — Progress Notes (Signed)
ANTIBIOTIC CONSULT NOTE - INITIAL  Pharmacy Consult for vancomycin + meropenem Indication: rule out sepsis  Allergies  Allergen Reactions  . Ambien [Zolpidem Tartrate] Other (See Comments)    Crazy dreams  . Ceftin [Cefuroxime Axetil] Nausea And Vomiting    Pt tolerated Cefepime 09/2014 admission  . Hctz [Hydrochlorothiazide] Other (See Comments)    Urinary retention  . Hydrocodone Nausea Only  . Lodine [Etodolac] Hives  . Promethazine Other (See Comments)    Hallucinations.   . Roxicodone [Oxycodone Hcl] Swelling  . Codeine Rash  . Latex Rash  . Penicillins Rash    Patient Measurements: Height: 4\' 9"  (144.8 cm) Weight: 145 lb 4.5 oz (65.9 kg) IBW/kg (Calculated) : 38.6 Adjusted Body Weight:   Vital Signs: Temp: 103.3 F (39.6 C) (01/01 1208) Temp Source: Axillary (01/01 1208) BP: 72/51 mmHg (01/01 1145) Pulse Rate: 96 (01/01 0830) Intake/Output from previous day: 12/31 0701 - 01/01 0700 In: 1780.2 [I.V.:540.2; NG/GT:170; IV Piggyback:650; TPN:420] Out: 3085 [Urine:2375; Emesis/NG output:700; Drains:10] Intake/Output from this shift: Total I/O In: 2053.8 [I.V.:173.8; NG/GT:830; IV Piggyback:1050] Out: 110 [Urine:110]  Labs:  Recent Labs  10/07/14 0500 10/08/14 0425 10/08/14 1645 10/09/14 0230 10/09/14 0949  WBC 20.9* 23.5*  --   --  29.2*  HGB 10.2* 10.3*  --   --  11.5*  PLT 764* 854*  --   --  615*  CREATININE 0.68 0.69 0.91 0.96  --    Estimated Creatinine Clearance: 55.4 mL/min (by C-G formula based on Cr of 0.96). No results for input(s): VANCOTROUGH, VANCOPEAK, VANCORANDOM, GENTTROUGH, GENTPEAK, GENTRANDOM, TOBRATROUGH, TOBRAPEAK, TOBRARND, AMIKACINPEAK, AMIKACINTROU, AMIKACIN in the last 72 hours.   Microbiology: Recent Results (from the past 720 hour(s))  Urine culture     Status: None   Collection Time: 09/26/14  7:22 PM  Result Value Ref Range Status   Specimen Description URINE, CATHETERIZED  Final   Special Requests NONE  Final   Culture   Setup Time   Final    09/28/2014 00:50 Performed at [place] Performed at [facility]   Final   Culture NO GROWTH Performed at [facility]   Final   Report Status 09/29/2014 FINAL  Final  Blood culture (routine x 2)     Status: None   Collection Time: 09/26/14  8:56 PM  Result Value Ref Range Status   Specimen Description BLOOD LEFT ANTECUBITAL DRAWN BY RN  Final   Special Requests BOTTLES DRAWN AEROBIC ONLY 4CC  Final   Culture  Setup Time   Final    09/30/2014 20:55 Performed at [facility]    Culture   Final    CANDIDA GLABRATA Note: Gram Stain Report Called to,Read Back By and Verified With: WILKINSON R([facility])@1140  ON 09/30/14 BY Elza Rafter. Performed at [facility] Performed at [facility]    Report Status 10/03/2014 FINAL  Final  Blood culture (routine x 2)     Status: None   Collection Time: 09/26/14  9:00 PM  Result Value Ref Range Status   Specimen Description BLOOD LEFT ANTECUBITAL DRAWN BY RN  Final   Special Requests BOTTLES DRAWN AEROBIC ONLY 4CC  Final   Culture   Final    CANDIDA GLABRATA Note: Gram Stain Report Called to,Read Back By and Verified With: DICKENS B AT [facility] AT [id number] ON [id number] BY Mikel Cella K Performed at [facility]    Report Status 10/04/2014 FINAL  Final  MRSA PCR Screening     Status: None   Collection Time: 09/26/14 11:30 PM  Result Value Ref Range Status   MRSA by PCR NEGATIVE NEGATIVE Final    Comment:        The GeneXpert MRSA Assay (FDA approved for NASAL specimens only), is one component of a comprehensive MRSA colonization surveillance program. It is not intended to diagnose MRSA infection nor to guide or monitor treatment for MRSA infections.   Body fluid culture     Status: None   Collection Time: 09/27/14  1:59 AM  Result Value Ref Range Status   Specimen Description FLUID PERITONEAL  Final   Special Requests ON SWAB  Final   Gram Stain    Final    RARE WBC PRESENT, PREDOMINANTLY PMN NO ORGANISMS SEEN Performed at [facility]    Culture   Final    FEW CANDIDA GLABRATA Note: CRITICAL RESULT CALLED TO, READ BACK BY AND VERIFIED WITH: BEVON BY INGRAMA 12/23 12N Performed at [facility]    Report Status 10/03/2014 FINAL  Final  Anaerobic culture     Status: None   Collection Time: 09/27/14  1:59 AM  Result Value Ref Range Status   Specimen Description FLUID PERITONEAL  Final   Special Requests NONE  Final   Gram Stain   Final    FEW WBC PRESENT, PREDOMINANTLY PMN NO SQUAMOUS EPITHELIAL CELLS SEEN NO ORGANISMS SEEN Performed at [facility]    Culture   Final    NO ANAEROBES ISOLATED Performed at [facility]    Report Status 10/02/2014 FINAL  Final  Culture, blood (routine x 2)     Status: None   Collection Time: 10/01/14  9:24 PM  Result Value Ref Range Status   Specimen Description BLOOD LEFT HAND  Final   Special Requests BOTTLES DRAWN AEROBIC ONLY 10CC  Final   Culture   Final    NO GROWTH 5 DAYS Note: Culture results may be compromised due to an excessive volume of blood received in culture bottles. Performed at [facility]    Report Status 10/08/2014 FINAL  Final  Culture, blood (routine x 2)     Status: None   Collection Time: 10/01/14  9:35 PM  Result Value Ref Range Status   Specimen Description BLOOD RIGHT HAND  Final   Special Requests BOTTLES DRAWN AEROBIC ONLY 10CC  Final   Culture   Final    NO GROWTH 5 DAYS Note: Culture results may be compromised due to an excessive volume of blood received in culture bottles. Performed at [facility]    Report Status 10/08/2014 FINAL  Final  Urine culture     Status: None   Collection Time: 10/06/14 12:02 PM  Result Value Ref Range Status   Specimen Description URINE, CATHETERIZED  Final   Special Requests NONE  Final   Colony Count NO GROWTH Performed at [facility]   Final   Culture NO  GROWTH Performed at [facility]   Final   Report Status 10/07/2014 FINAL  Final  Clostridium Difficile by PCR     Status: None   Collection Time: 10/07/14  8:33 PM  Result Value Ref Range Status   C difficile by pcr NEGATIVE NEGATIVE Final    Medical History: Past Medical History  Diagnosis Date  . Anxiety   . Depression   . COPD (chronic obstructive pulmonary disease)   . MS (multiple sclerosis)   .  Fibromyalgia   . Impaired fasting glucose   . 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 Eating disorder   . Chronic low back pain   . PONV (postoperative nausea and vomiting)   . Chronic bronchitis     "yearly" (02/11/2013)  . Borderline diabetes   . GERD (gastroesophageal reflux disease)   . Migraine     "I use to" (02/11/2013)  . Arthritis     "hands & feet" (02/11/2013)  . Peripheral neuropathy     [name] 02/11/2013  . B12 deficiency anemia     [name] 02/11/2013  . Chronic pain syndrome     [name] 02/11/2013  . UTI (lower urinary tract infection) 02/11/2013    [name] 02/11/2013  . Chronic edema     BLE/notes 02/11/2013  . Ataxia   . Peripheral neuropathy   . Muscle weakness of lower extremity     bilateral    Medications:  Anti-infectives    Start     Dose/Rate Route Frequency Ordered Stop   10/09/14 1400  vancomycin (VANCOCIN) IVPB 1000 mg/200 mL premix     1,000 mg200 mL/hr over 60 Minutes Intravenous Every 12 hours 10/09/14 1303     10/09/14 1400  meropenem (MERREM) 1 g in sodium chloride 0.9 % 100 mL IVPB     1 g200 mL/hr over 30 Minutes Intravenous 3 times per day 10/09/14 1303     10/09/14 1200  cefTRIAXone (ROCEPHIN) 1 g in dextrose 5 % 50 mL IVPB - Premix  Status:  Discontinued     1 g100 mL/hr over 30 Minutes Intravenous Every 24 hours 10/08/14 1519 10/09/14 1255   10/08/14 2200  metroNIDAZOLE (FLAGYL) IVPB 500 mg     500 mg100 mL/hr over 60 Minutes Intravenous 3 times per day 10/08/14 1520     10/08/14 1400  metroNIDAZOLE  (FLAGYL) IVPB 500 mg  Status:  Discontinued     500 mg100 mL/hr over 60 Minutes Intravenous 3 times per day 10/08/14 0945 10/08/14 1516   10/08/14 1000  cefTRIAXone (ROCEPHIN) 1 g in dextrose 5 % 50 mL IVPB - Premix  Status:  Discontinued     1 g100 mL/hr over 30 Minutes Intravenous Every 24 hours 10/08/14 0945 10/08/14 1516   09/30/14 1300  micafungin (MYCAMINE) 100 mg in sodium chloride 0.9 % 100 mL IVPB    Comments:  Pharmacy may adjust as needed   100 mg100 mL/hr over 1 Hours Intravenous Every 24 hours 09/30/14 1213     09/27/14 2100  ceFEPIme (MAXIPIME) 2 g in dextrose 5 % 50 mL IVPB  Status:  Discontinued     2 g100 mL/hr over 30 Minutes Intravenous Every 24 hours 09/27/14 0603 09/27/14 1103   09/27/14 2000  vancomycin (VANCOCIN) IVPB 1000 mg/200 mL premix  Status:  Discontinued     1,000 mg200 mL/hr over 60 Minutes Intravenous Every 24 hours 09/27/14 0603 09/27/14 1103   09/27/14 1400  vancomycin (VANCOCIN) IVPB 1000 mg/200 mL premix  Status:  Discontinued     1,000 mg200 mL/hr over 60 Minutes Intravenous Every 12 hours 09/27/14 1103 10/02/14 1348   09/27/14 1200  ceFEPIme (MAXIPIME) 2 g in dextrose 5 % 50 mL IVPB  Status:  Discontinued     2 g100 mL/hr over 30 Minutes Intravenous Every 12 hours 09/27/14 1103 10/02/14 1348   09/26/14 1930  vancomycin (VANCOCIN) IVPB 1000 mg/200 mL premix     1,000 mg200 mL/hr over 60 Minutes Intravenous  Once 09/26/14 1917  09/26/14 2210   09/26/14 1930  ceFEPIme (MAXIPIME) 2 g in dextrose 5 % 50 mL IVPB     2 g100 mL/hr over 30 Minutes Intravenous  Once 09/26/14 1917 09/26/14 2132     Assessment: 40 yof with complicated hospitalization s/p cardiac arrest last night, now with probable worsening to sepsis to broaden antibiotics to meropenem and vancomycin. She continues on micafungin and flagyl. Tmax is 103.3 and WBC is significantly elevated at 23.5.  Vanc 12/19>>12/25; 1/1>>    12/22 VT = 17.5 Cefepime 12/19>>12/25 Mica 12/23>> Rocephin  12/31>>1/1 Flagyl 12/31>> Meropenem 1/1>>  12/30 Cdiff PCR>>NEG 12/29 Urine>>Neg 12/24 BCx2>>NEG 12/20 Peritoneal fluid - candida glabrata (usually azole resistant) 12/19 Blood - 2/2 candida glabrata 12/19 Urine -Neg  Goal of Therapy:  Vancomycin trough level 15-20 mcg/ml  Plan:  1. Vancomycin 1gm IV Q12H 2. Meropenem 1gm IV Q8H 3. F/u renal fxn, C&S, clinical status and trough at [facility], Rande Lawman 10/09/2014,1:04 PM

## 2014-10-09 NOTE — Progress Notes (Signed)
PARENTERAL NUTRITION CONSULT NOTE - FOLLOW UP  Pharmacy Consult for TPN Indication: S/P exp lap for perforated gastric ulcer  Allergies  Allergen Reactions  . Ambien [Zolpidem Tartrate] Other (See Comments)    Crazy dreams  . Ceftin [Cefuroxime Axetil] Nausea And Vomiting    Pt tolerated Cefepime 09/2014 admission  . Hctz [Hydrochlorothiazide] Other (See Comments)    Urinary retention  . Hydrocodone Nausea Only  . Lodine [Etodolac] Hives  . Promethazine Other (See Comments)    Hallucinations.   . Roxicodone [Oxycodone Hcl] Swelling  . Codeine Rash  . Latex Rash  . Penicillins Rash    Patient Measurements: Height: 4\' 9"  (144.8 cm) Weight: 145 lb 4.5 oz (65.9 kg) IBW/kg (Calculated) : 38.6 Adjusted Body Weight:  Usual Weight:   Vital Signs: Temp: 102.2 F (39 C) (01/01 0757) Temp Source: Axillary (01/01 0757) BP: 92/56 mmHg (01/01 0815) Pulse Rate: 117 (01/01 0739) Intake/Output from previous day: 12/31 0701 - 01/01 0700 In: 1780.2 [I.V.:540.2; NG/GT:170; IV Piggyback:650; TPN:420] Out: 3085 [Urine:2375; Emesis/NG output:700; Drains:10] Intake/Output from this shift: Total I/O In: 20 [I.V.:20] Out: -   Labs:  Recent Labs  10/07/14 0500 10/08/14 0425  WBC 20.9* 23.5*  HGB 10.2* 10.3*  HCT 33.2* 34.1*  PLT 764* 854*     Recent Labs  10/07/14 0500 10/08/14 0425 10/08/14 1645 10/09/14 0230  NA 139 144 148* 148*  K 3.6 3.5 2.8* 4.3  CL 92* 101 103 108  CO2 36* 35* 35* 27  GLUCOSE 208* 246* 144* 144*  BUN 11 17 19 20   CREATININE 0.68 0.69 0.91 0.96  CALCIUM 8.5 8.3* 7.8* 8.3*  MG 2.2 2.3 2.1 2.2  PHOS 4.3 3.6  --  3.3  PROT  --  7.8 7.3  --   ALBUMIN  --  2.3* 2.1*  --   AST  --  38* 74*  --   ALT  --  37* 53*  --   ALKPHOS  --  214* 217*  --   BILITOT  --  0.5 0.7  --    Estimated Creatinine Clearance: 55.4 mL/min (by C-G formula based on Cr of 0.96).    Recent Labs  10/09/14 0400 10/09/14 0750 10/09/14 0757  GLUCAP 131* 39* 142*     Medications:  Scheduled:  . antiseptic oral rinse  7 mL Mouth Rinse QID  . cefTRIAXone (ROCEPHIN)  IV  1 g Intravenous Q24H  . chlorhexidine  15 mL Mouth Rinse BID  . enoxaparin (LOVENOX) injection  70 mg Subcutaneous Q12H  . fentaNYL  50 mcg Intravenous Once  . insulin aspart  0-9 Units Subcutaneous 6 times per day  . metronidazole  500 mg Intravenous 3 times per day  . micafungin [facility]) IV  100 mg Intravenous Q24H  . pantoprazole sodium  40 mg Per Tube Q1200  . sodium chloride  1,000 mL Intravenous Once  . sodium chloride  10-40 mL Intracatheter Q12H    Insulin Requirements: 14 units Novolog SSI/24hr  Nutritional Requirements: [phone] Kcal with 60-65g protein per RD recommendations (22-25 kcals/kg ideal body weight) based on ASPEN guidelines for hypocaloric, high protein feeding in critically ill obese individuals  Current Nutrition: Currently no TPN as d/c'd 12/31 and no TF  Admit:  Admitted 12/20 with abd pain with vomiting/fevers. (+)pneumoperitoneum 2nd SB rupture.  Spoke with Dr. Barry Dienes on 12/31 re: plan to d/c TPN and given order to d/c.  TF was held for re-intubation and not resumed, to continue TPN.  GI:  S/P exp lap/oversew of perforated pre-pyloric gastric ulcer; omental patch 12/20. (+)Hx GERD.  12/26 CT(+)pelvic fluid. PPI IV. Prealbumin 3.6 at baseline (low but noted recent surgery). Prealb 12/28= 5.8 TF d/c'd.  NG output 780mL  Endo: Borderline DM.Insulin removed from TPN due to hypoglycemia CBGs 131-163 on no TPN/TF  Lytes: Na 148, K 4.3  Renal:  Cr 0.96, UOP 1.57ml/kg/hr.  I/O (-)1.2L yesterday  Pulm: Hx COPD, chronic bronchitis. Acute resp failure, re-intubated 12/31  Cards:BP ok. HR 107. Labetalol prn  AC: S/P ulnar artery embolectomy 12/20, on Heparin IV.  Hepatobil: AST mildly elevated, T bili 0.8 . TG 222  Neuro: Hx MS, depression, periph neuropathy. Fentanyl drip  ID: Micafungin (12/23 >> ) for yeast in blood and  peritoneal fluid. Tm 103.3 & Tc 102.2, WBC 20.9 New CVC placed 12/28 ID recs: line holiday, No TPN to clear fungemia, & LOT 30day to start in future when able to give line holiday.  Best Practices: SCDs TPN Access: R-IJ CVL, L-IJ CVC 12/28 TPN day#: 12/23 >>  Plan: Clinimix-E 5/15 at 28ml/hr and 20% Lipid emulsion 57ml/hr on MWF to meet 100% patient's estimated needs TPN labs q Mon, Thurs BMet, Mg, Phos in AM F/U plans for TF and d/c of TPN.  Gracy Bruins, PharmD Clinical Pharmacist [facility]

## 2014-10-09 NOTE — Progress Notes (Signed)
Spoke with Round MD from CCM concerning continuing the use of restraints since patient is unresponsive and not moving. MD still wanted restraints to be continued though the shift and re-evaluate the usage of them later due to patient being such a difficult intubation. Also wants to cont fent drip and versed pushes even though patient is unresponsive due to increases work of breathing and rapid resp rate

## 2014-10-09 NOTE — Progress Notes (Signed)
eLink Physician-Brief Progress Note Patient Name: [name] DOB: 08-25-1966 MRN: [id number]   Date of Service  10/09/2014  HPI/Events of Note  Difficult airway Increasing agitation  eICU Interventions  High risk deat if self extub revnew restraint     Intervention Category Minor Interventions: Clinical assessment - ordering diagnostic tests  Raylene Miyamoto. 10/09/2014, 5:32 PM

## 2014-10-09 NOTE — Progress Notes (Signed)
PULMONARY / CRITICAL CARE MEDICINE HISTORY AND PHYSICAL EXAMINATION   Name: [name] MRN: [id number] DOB: 12-30-1965    ADMISSION DATE:  09/26/2014  PRIMARY SERVICE: PCCM  CHIEF COMPLAINT:  Abd pain  BRIEF PATIENT DESCRIPTION: 1yof smoker with PMH of MS, depression presented 12/19 on transfer from [facility] with abd pain, found to have pneumoperitoneum 2/2 small bowel rupture, also with new cold L hand.  Required surgery for DU perf & left brachial bypass, fungemia noted on admission cultures.  SIGNIFICANT EVENTS / STUDIES:  09/26/14: Admitted, CT Abd with pneumoperitoneum, cold L hand concerning for arterial clot as well. 12/20 > ex-lap [name]) with omental patch for perforated prepyloric gastric ulcer; L brachial and ulnar artery embolectomy ([facility]) 12/20 LE doppler >>neg 12/20 >>Left brachial to left radial artery bypass with left leg greater saphenous vein for Recurrent occlusion  12/20 Self extubated - had to be reintubated 12/21 echo >>nml LVEF 12/26 CT abdomen >> pelvic fluid 12/28 TEE neg 2023/10/16 reintubated, coded 4 minutes; CT head without acute change  LINES / TUBES: R IJ CVL 12/20 >> 12/29 LIJ 12/28 >> JP drain RUQ 12/20 >>  ETT 12/19 >> 12/29, 12/31>>   SUBJECTIVE:  Emergently intubated yesterday, difficult intubation Worsening hypoxemia and shock overnight Not responsive  VITAL SIGNS: Temp:  [100.2 F (37.9 C)-103.3 F (39.6 C)] 102.2 F (39 C) (01/01 0757) Pulse Rate:  [104-148] 117 (01/01 0739) Resp:  [14-91] 43 (01/01 0815) BP: (82-177)/(29-124) 92/56 mmHg (01/01 0815) SpO2:  [77 %-100 %] 89 % (01/01 0815) FiO2 (%):  [80 %-100 %] 100 % (01/01 0815) Weight:  [65.9 kg (145 lb 4.5 oz)] 65.9 kg (145 lb 4.5 oz) (01/01 0500) HEMODYNAMICS:    Vent Mode:  [-] PRVC FiO2 (%):  [80 %-100 %] 100 % Set Rate:  [18 bmp] 18 bmp Vt Set:  [500 mL] 500 mL PEEP:  [5 cmH20] 5 cmH20 Plateau Pressure:  [21 cmH20-25 cmH20] 24 cmH20 INTAKE /  OUTPUT: Intake/Output      October 16, 2023 0701 - 01/01 0700 01/01 0701 - 01/02 0700   I.V. (mL/kg) 540.2 (8.2) 20 (0.3)   NG/GT 170 30   IV Piggyback 650    TPN 420    Total Intake(mL/kg) 1780.2 (27) 50 (0.8)   Urine (mL/kg/hr) 2375 (1.5)    Emesis/NG output 700 (0.4)    Drains 10 (0)    Total Output 3085     Net -1304.8 +50        Stool Occurrence 1 x      PHYSICAL EXAMINATION:  Gen: eyes open, coughing HEENT: NCAT, ETT in place PULM: CTA B CV: Tachy, regular AB: nontender Derm: mottled, cool Neuro: eyes open but does, pupillary light response intact, cough/gag noted but no response to painful stimuli  LABS: I have reviewed all of today's lab results. Relevant abnormalities are discussed in the A/P section  CXR 1/1 > ETT and CVL in place, atelectasis L > R base  ASSESSMENT / PLAN:  Principal Problem:   Pneumoperitoneum Active Problems:   Sepsis   Altered mental state   Brachial artery occlusion   Acute respiratory failure with hypoxia   Severe sepsis   Perforated gastric ulcer   Acute respiratory failure   Artery occlusion   Blood poisoning   Candidemia   Screen for STD (sexually transmitted disease)   Abdominal abscess   Septic thrombophlebitis   Elevated WBCs   Fever   Limb ischemia   HCAP (healthcare-associated pneumonia)   PULMONARY  A: ETT 12/19 >>12/29, 12/31 >>  Acute respiratory failure with hypoxemia > hypoxemia out of proportion to CXR, emphysema? PE less likely on anticoagulation Difficult airway 12/31 due to cord edema P:   Continue full vent support Sedate to RASS -2 for vent synchrony given profound hypoxemia   CARDIOVASCULAR A: Sinus tachycardia, reactive Shock 1/1 > septic? Cardiogenic? LVEF 65% on 12/28 Demand ischemia 1/1 Brachial artery occlusion, s/p bypass, TEE neg P:   Hold labetalol Check CVP Check coox, procalcitonin 12 lead now Bolus saline now  Continue full dose lovenox at this point, long term would transition to  DOAC   RENAL A: AKI, resolved P:   Monitor BMET intermittently Monitor I/Os Correct electrolytes as indicated Hold lasix 1/1   GASTROINTESTINAL A: Perforated gastric ulcer and pneumoperitoneum  Post laparotomy P:   Post op care per surgery Cont TPN, TFs at goal Cont SUP  HEMATOLOGIC A: Arterial clot as noted above P:   lovenox Rx doses Monitor CBC intermittently Transfuse per usual ICU guidelines  INFECTIOUS A: Peritonitis after bowel perforation Septic shock 1/1 > peritonitis? candidemia> ID plans 6 weeks of therapy P:   Blood cx: 09/26/14-->  NGTD, 12/24 >> ng 12/20 peritoneal fluid >> few yeast  12/31 blood > 12/31 resp >  1/1 procalcitonin >   Vanc: 12/19 >> 12/25 Zosyn:  12/19 >> 12/25 Micafungin 12/23 >>  Ceftriaxone 12/31 > Flagyl 12/31 >   If stabilizes 1/1 will send for CT abdomen per ID recs; will clarify antibiotics with ID, think she may need broader spectrum  coverage  ENDOCRINE A:  Hypoglycemia, resolved Intermittent hyperglycemia P:   Cont SSI -resistant  NEUROLOGIC A: Post op pain Acute encephalopathy 1/1 > worrisome for anoxic injury but difficult to tell in setting of shock P:   RASS goal -2 Fentanyl gtt, versed prn   Social / Family: domestic partner mark updated by phone 1/1  TODAY'S SUMMARY: Worsening shock, hypoxemia after cardiac arrest yesterday, overall picture worrisome for worsening sepsis, presumably Will order CT abdomen if she stabilizes today.  Will discuss antibiotics with ID.   Overall condition is critical, prognosis guarded at this point.    The patient is critically ill with multiple organ systems failure and requires high complexity decision making for assessment and support, frequent evaluation and titration of therapies, application of advanced monitoring technologies and extensive interpretation of multiple databases. Critical Care Time devoted to patient care services described in this note independent of  APP time is 60 minutes.    Simonne Maffucci MD  10/09/2014, 8:44 AM

## 2014-10-10 ENCOUNTER — Inpatient Hospital Stay (HOSPITAL_COMMUNITY): Payer: BLUE CROSS/BLUE SHIELD

## 2014-10-10 DIAGNOSIS — D72829 Elevated white blood cell count, unspecified: Secondary | ICD-10-CM

## 2014-10-10 DIAGNOSIS — J189 Pneumonia, unspecified organism: Secondary | ICD-10-CM

## 2014-10-10 DIAGNOSIS — I248 Other forms of acute ischemic heart disease: Secondary | ICD-10-CM

## 2014-10-10 DIAGNOSIS — K631 Perforation of intestine (nontraumatic): Secondary | ICD-10-CM

## 2014-10-10 LAB — BASIC METABOLIC PANEL
Anion gap: 8 (ref 5–15)
Anion gap: 6 (ref 5–15)
BUN: 23 mg/dL (ref 6–23)
BUN: 16 mg/dL (ref 6–23)
CALCIUM: 7.7 mg/dL — AB (ref 8.4–10.5)
CHLORIDE: 104 meq/L (ref 96–112)
CHLORIDE: 106 meq/L (ref 96–112)
CO2: 26 mmol/L (ref 19–32)
CO2: 28 mmol/L (ref 19–32)
Calcium: 7.3 mg/dL — ABNORMAL LOW (ref 8.4–10.5)
Creatinine, Ser: 1.07 mg/dL (ref 0.50–1.10)
Creatinine, Ser: 0.84 mg/dL (ref 0.50–1.10)
GFR calc Af Amer: 69 mL/min — ABNORMAL LOW (ref >90)
GFR calc non Af Amer: 60 mL/min — ABNORMAL LOW (ref >90)
GFR, EST NON AFRICAN AMERICAN: 80 mL/min — AB (ref >90)
GLUCOSE: 254 mg/dL — AB (ref 70–99)
Glucose, Bld: 324 mg/dL — ABNORMAL HIGH (ref 70–99)
POTASSIUM: 3 mmol/L — AB (ref 3.5–5.1)
Potassium: 4.3 mmol/L (ref 3.5–5.1)
SODIUM: 140 mmol/L (ref 135–145)
Sodium: 138 mmol/L (ref 135–145)

## 2014-10-10 LAB — GLUCOSE, CAPILLARY
GLUCOSE-CAPILLARY: 163 mg/dL — AB (ref 70–99)
GLUCOSE-CAPILLARY: 206 mg/dL — AB (ref 70–99)
GLUCOSE-CAPILLARY: 218 mg/dL — AB (ref 70–99)
GLUCOSE-CAPILLARY: 259 mg/dL — AB (ref 70–99)
Glucose-Capillary: 263 mg/dL — ABNORMAL HIGH (ref 70–99)
Glucose-Capillary: 218 mg/dL — ABNORMAL HIGH (ref 70–99)
Glucose-Capillary: 182 mg/dL — ABNORMAL HIGH (ref 70–99)

## 2014-10-10 LAB — MAGNESIUM
MAGNESIUM: 2.2 mg/dL (ref 1.5–2.5)
Magnesium: 2 mg/dL (ref 1.5–2.5)

## 2014-10-10 LAB — CBC
HEMATOCRIT: 31.8 % — AB (ref 36.0–46.0)
Hemoglobin: 9.8 g/dL — ABNORMAL LOW (ref 12.0–15.0)
MCH: 31.6 pg (ref 26.0–34.0)
MCHC: 30.8 g/dL (ref 30.0–36.0)
MCV: 102.6 fL — ABNORMAL HIGH (ref 78.0–100.0)
Platelets: 540 10*3/uL — ABNORMAL HIGH (ref 150–400)
RBC: 3.1 MIL/uL — ABNORMAL LOW (ref 3.87–5.11)
RDW: 14.6 % (ref 11.5–15.5)
WBC: 30.3 10*3/uL — AB (ref 4.0–10.5)

## 2014-10-10 LAB — PROCALCITONIN: PROCALCITONIN: 38.5 ng/mL

## 2014-10-10 LAB — PHOSPHORUS: Phosphorus: 2.5 mg/dL (ref 2.3–4.6)

## 2014-10-10 MED ORDER — VITAL AF 1.2 CAL PO LIQD
1000.0000 mL | ORAL | Status: DC
  Administered 2014-10-10 – 2014-10-18 (×6): 1000 mL
  Filled 2014-10-10 (×13): qty 1000

## 2014-10-10 MED ORDER — HYDROCORTISONE NA SUCCINATE PF 100 MG IJ SOLR
50.0000 mg | Freq: Four times a day (QID) | INTRAMUSCULAR | Status: DC
  Administered 2014-10-10 – 2014-10-11 (×4): 50 mg via INTRAVENOUS
  Filled 2014-10-10 (×8): qty 1

## 2014-10-10 MED ORDER — ASPIRIN 325 MG PO TABS
325.0000 mg | ORAL_TABLET | Freq: Every day | ORAL | Status: DC
  Administered 2014-10-10 – 2014-10-11 (×2): 325 mg via ORAL
  Filled 2014-10-10 (×3): qty 1

## 2014-10-10 MED ORDER — NOREPINEPHRINE BITARTRATE 1 MG/ML IV SOLN
2.0000 ug/min | INTRAVENOUS | Status: DC
  Administered 2014-10-10: 25 ug/min via INTRAVENOUS
  Administered 2014-10-11: 3 ug/min via INTRAVENOUS
  Filled 2014-10-10 (×3): qty 16

## 2014-10-10 MED ORDER — M.V.I. ADULT IV INJ
INTRAVENOUS | Status: AC
  Administered 2014-10-10: 18:00:00 via INTRAVENOUS
  Filled 2014-10-10: qty 2000

## 2014-10-10 MED ORDER — VITAL HIGH PROTEIN PO LIQD
1000.0000 mL | ORAL | Status: DC
  Administered 2014-10-10: 1000 mL
  Administered 2014-10-10: 17:00:00
  Filled 2014-10-10 (×3): qty 1000

## 2014-10-10 MED ORDER — POTASSIUM CHLORIDE 20 MEQ/15ML (10%) PO SOLN
40.0000 meq | Freq: Once | ORAL | Status: AC
  Administered 2014-10-10: 40 meq
  Filled 2014-10-10 (×2): qty 30

## 2014-10-10 MED ORDER — POTASSIUM CHLORIDE 10 MEQ/50ML IV SOLN
10.0000 meq | INTRAVENOUS | Status: AC
  Administered 2014-10-10 (×4): 10 meq via INTRAVENOUS
  Filled 2014-10-10 (×4): qty 50

## 2014-10-10 NOTE — Progress Notes (Signed)
NUTRITION FOLLOW-UP / CONSULT  DOCUMENTATION CODES Per approved criteria  -Obesity Unspecified   INTERVENTIONS: TF via NGT Vital AF 1.2 at 20 ml/h, increase by 10 ml every 4 hours to goal rate of 50 ml/h to provide 1440 kcals, 90 gm protein, 973 ml free water daily.  Wean TPN per Pharmacy, adjust rate based on TF tolerance and advancement.   NUTRITION DIAGNOSIS: Inadequate oral intake related to inability to eat as evidenced by NPO s/p bowel surgery, peritonitis; ongoing.   Goal:  Enteral nutrition to provide 60-70% of estimated calorie needs (22-25 kcals/kg ideal body weight) and 100% of estimated protein needs, based on ASPEN guidelines for hypocaloric, high protein feeding in critically ill obese individuals, ongoing.  Monitor:  TF tolerance/adequacy, TPN prescription, weight trend, labs, vent status.  ASSESSMENT: 1/2 Pt re-intubated on ventilator. Tube feeding resumed Vital high Protein @ 20 ml/hr via NGT.  MV: 10  L/min Temp (24hrs), Avg:98.4 F (36.9 C), Min:98 F (36.7 C), Max:98.9 F (37.2 C)  12/29-Pt with h/o MS admitted with abdominal pain.  Patient found to have pneumoperitoneum 2/2 to small bowel rupture.  Also with brachial artery clot s/p surgery to repair both.   Patient was extubated this AM. TF of Vital High Protein was initiated yesterday at 10 ml/h, tolerated well per RN. Plans to resume TF at [facility] and advance to goal rate. Received MD Consult for TF initiation and management.  TPN: Clinimix E 5/15 decreasing to 50 ml/h today without lipids to provide 852 kcals and 60 gm protein.   Height: Ht Readings from Last 1 Encounters:  09/27/14 4\' 9"  (1.448 m)    Weight: Wt Readings from Last 1 Encounters:  10/10/14 149 lb 14.6 oz (68 kg)   10/05/14 166 lb (75.297 kg)   10/01/14 176 lb 5.9 oz (80 kg)    BMI:  Body mass index is 32.43 kg/(m^2).  Estimated Nutritional Needs: Kcal: 1400-1600 Protein: >/= 85 grams Fluid: 1.5 L/day  Skin: surgical  incisions  Diet Order: Diet NPO time specified TPN (CLINIMIX-E) Adult TPN (CLINIMIX-E) Adult   Intake/Output Summary (Last 24 hours) at 10/10/14 1746 Last data filed at 10/10/14 1601  Gross per 24 hour  Intake 6003.13 ml  Output   1380 ml  Net 4623.13 ml    Last BM: 12/31  Labs:   Recent Labs Lab 10/08/14 0425 10/08/14 1645 10/09/14 0230 10/10/14 0415  NA 144 148* 148* 138  K 3.5 2.8* 4.3 3.0*  CL 101 103 108 104  CO2 35* 35* 27 26  BUN 17 19 20 23   CREATININE 0.69 0.91 0.96 1.07  CALCIUM 8.3* 7.8* 8.3* 7.3*  MG 2.3 2.1 2.2 2.2  PHOS 3.6  --  3.3 2.5  GLUCOSE 246* 144* 144* 324*    CBG (last 3)   Recent Labs  10/10/14 0826 10/10/14 1155 10/10/14 1544  GLUCAP 218* 218* 182*    Scheduled Meds: . antiseptic oral rinse  7 mL Mouth Rinse QID  . aspirin  325 mg Oral Daily  . chlorhexidine  15 mL Mouth Rinse BID  . enoxaparin (LOVENOX) injection  70 mg Subcutaneous Q12H  . feeding supplement (VITAL HIGH PROTEIN)  1,000 mL Per Tube Q24H  . hydrocortisone sodium succinate  50 mg Intravenous Q6H  . insulin aspart  0-9 Units Subcutaneous 6 times per day  . meropenem (MERREM) IV  1 g Intravenous 3 times per day  . metronidazole  500 mg Intravenous 3 times per day  . micafungin [facility])  IV  100 mg Intravenous Q24H  . pantoprazole sodium  40 mg Per Tube Q1200  . sodium chloride  10-40 mL Intracatheter Q12H  . vancomycin  1,000 mg Intravenous Q12H    Continuous Infusions: . sodium chloride 20 mL/hr at 10/09/14 1117  . Marland KitchenTPN (CLINIMIX-E) Adult Stopped (10/10/14 1740)   And  . fat emulsion Stopped (10/10/14 1740)  . fentaNYL infusion INTRAVENOUS 400 mcg/hr (10/10/14 1600)  . midazolam (VERSED) infusion 1.5 mg/hr (10/10/14 1000)  . norepinephrine (LEVOPHED) Adult infusion 20 mcg/min (10/10/14 1600)  . Marland KitchenTPN (CLINIMIX-E) Adult 50 mL/hr at 10/10/14 1740  . vasopressin (PITRESSIN) infusion - *FOR SHOCK* 0.03 Units/min (10/10/14 1000)    Colman Cater  MS,RD,CSG,LDN Office: [phone] Pager: [phone]

## 2014-10-10 NOTE — Progress Notes (Signed)
[facility] Progress Note Patient Name: [name] DOB: 1966-06-13 MRN: [id number]   Date of Service  10/10/2014  HPI/Events of Note  Concern for rhythm change Also 1/2 BC pos back, clusters  eICU Interventions  Cont vanc ecg     Intervention Category Intermediate Interventions: Arrhythmia - evaluation and management  FEINSTEIN,DANIEL J. 10/10/2014, 12:03 AM

## 2014-10-10 NOTE — Progress Notes (Signed)
EEG completed, results pending. 

## 2014-10-10 NOTE — Progress Notes (Signed)
PARENTERAL NUTRITION CONSULT NOTE - FOLLOW UP  Pharmacy Consult for TPN Indication: S/P exp lap for perforated gastric ulcer  Allergies  Allergen Reactions  . Ambien [Zolpidem Tartrate] Other (See Comments)    Crazy dreams  . Ceftin [Cefuroxime Axetil] Nausea And Vomiting    Pt tolerated Cefepime 09/2014 admission  . Hctz [Hydrochlorothiazide] Other (See Comments)    Urinary retention  . Hydrocodone Nausea Only  . Lodine [Etodolac] Hives  . Promethazine Other (See Comments)    Hallucinations.   . Roxicodone [Oxycodone Hcl] Swelling  . Codeine Rash  . Latex Rash  . Penicillins Rash    Patient Measurements: Height: 4\' 9"  (144.8 cm) Weight: 149 lb 14.6 oz (68 kg) IBW/kg (Calculated) : 38.6 Adjusted Body Weight:  Usual Weight:   Vital Signs: Temp: 98 F (36.7 C) (01/02 0400) Temp Source: Oral (01/02 0400) BP: 105/68 mmHg (01/02 0600) Pulse Rate: 81 (01/02 0600) Intake/Output from previous day: 01/01 0701 - 01/02 0700 In: 6854.3 [I.V.:3025.1; NG/GT:920; IV Piggyback:2150; TPN:759.2] Out: 1190 [Urine:870; Emesis/NG output:300; Drains:20] Intake/Output from this shift:    Labs:  Recent Labs  10/08/14 0425 10/09/14 0949 10/10/14 0415  WBC 23.5* 29.2* 30.3*  HGB 10.3* 11.5* 9.8*  HCT 34.1* 37.0 31.8*  PLT 854* 615* 540*     Recent Labs  10/08/14 0425 10/08/14 1645 10/09/14 0230 10/10/14 0415  NA 144 148* 148* 138  K 3.5 2.8* 4.3 3.0*  CL 101 103 108 104  CO2 35* 35* 27 26  GLUCOSE 246* 144* 144* 324*  BUN 17 19 20 23   CREATININE 0.69 0.91 0.96 1.07  CALCIUM 8.3* 7.8* 8.3* 7.3*  MG 2.3 2.1 2.2 2.2  PHOS 3.6  --  3.3 2.5  PROT 7.8 7.3  --   --   ALBUMIN 2.3* 2.1*  --   --   AST 38* 74*  --   --   ALT 37* 53*  --   --   ALKPHOS 214* 217*  --   --   BILITOT 0.5 0.7  --   --    Estimated Creatinine Clearance: 50.6 mL/min (by C-G formula based on Cr of 1.07).    Recent Labs  10/09/14 1942 10/09/14 2352 10/10/14 0356  GLUCAP 283* 259* 263*     Medications:  Scheduled:  . antiseptic oral rinse  7 mL Mouth Rinse QID  . chlorhexidine  15 mL Mouth Rinse BID  . enoxaparin (LOVENOX) injection  70 mg Subcutaneous Q12H  . insulin aspart  0-9 Units Subcutaneous 6 times per day  . meropenem (MERREM) IV  1 g Intravenous 3 times per day  . metronidazole  500 mg Intravenous 3 times per day  . micafungin [facility]) IV  100 mg Intravenous Q24H  . pantoprazole sodium  40 mg Per Tube Q1200  . potassium chloride  10 mEq Intravenous Q1 Hr x 4  . sodium chloride  10-40 mL Intracatheter Q12H  . vancomycin  1,000 mg Intravenous Q12H    Insulin Requirements: 21 units Novolog SSI/24hr  Nutritional Requirements: [phone] Kcal with 60-65g protein per RD recommendations (22-25 kcals/kg ideal body weight) based on ASPEN guidelines for hypocaloric, high protein feeding in critically ill obese individuals  Current Nutrition: Clinimix E 5/15 infusing at 50 ml/hr and lipids at 10 ml/hr  Admit:  Admitted 12/20 with abd pain with vomiting/fevers. (+)pneumoperitoneum 2nd SB rupture.  Spoke with Dr. Barry Dienes on 12/31 re: plan to d/c TPN and given order to d/c.  TF was held for re-intubation  and not resumed, to continue TPN.  GI: S/P exp lap/oversew of perforated pre-pyloric gastric ulcer; omental patch 12/20. (+)Hx GERD.  12/26 CT(+)pelvic fluid. PPI IV. Prealbumin 3.6 at baseline (low but noted recent surgery). Prealb 12/28= 5.8 TF d/c'd.  NG output 361mL  Endo: Borderline DM.Insulin removed from TPN due to hypoglycemia CBGs 142-263 with trend up  Will add insulin to TPN.  May need to change to moderate or resistant scale per diabetes coordinator recommendations Lytes: Na 138 K 3.0, mag 2.2, phos 2.5  Renal:  Cr 1.07 UOP 0.5 ml/kg/hr.  I/O (+)6L yesterday  Pulm: Hx COPD, chronic bronchitis. Acute resp failure, re-intubated 12/31  Cards:requiring NE and vasopressin   AC: S/P ulnar artery embolectomy 12/20, on Heparin  IV.  Hepatobil: AST mildly elevated, T bili 0.8 . TG 222  Neuro: Hx MS, depression, periph neuropathy. Fentanyl and versed drip  ID: Micafungin (12/23 >> ) for yeast in blood and peritoneal fluid. Reintubated 12/30 with suspicion of septic shock  Tm 103.3, WBC 30.3 New CVC placed 12/28   Best Practices: SCDs TPN Access: R-IJ CVL, L-IJ CVC 12/28 TPN day#: 12/23 >>  Plan: Clinimix-E 5/15 at 6ml/hr and 20% Lipid emulsion 66ml/hr on MWF to meet 100% patient's estimated needs MD repleting K with 4 runs 75mEq IV Add 10 units insulin to TPN TPN labs q Mon, Thurs F/U plans for TF and d/c of TPN.  Thanks for allowing pharmacy to be a part of this patient's care.  Excell Seltzer, PharmD Clinical Pharmacist, [phone]

## 2014-10-10 NOTE — Progress Notes (Signed)
INFECTIOUS DISEASE PROGRESS NOTE  ID: [name] is a 49 y.o. female with  Principal Problem:   Pneumoperitoneum Active Problems:   Sepsis   Altered mental state   Brachial artery occlusion   Acute respiratory failure with hypoxia   Severe sepsis   Perforated gastric ulcer   Acute respiratory failure   Artery occlusion   Blood poisoning   Candidemia   Screen for STD (sexually transmitted disease)   Abdominal abscess   Septic thrombophlebitis   Elevated WBCs   Fever   Limb ischemia   HCAP (healthcare-associated pneumonia)   Septic shock  Subjective: Awakens to stimuli  Abtx:  Anti-infectives    Start     Dose/Rate Route Frequency Ordered Stop   10/09/14 1400  vancomycin (VANCOCIN) IVPB 1000 mg/200 mL premix     1,000 mg200 mL/hr over 60 Minutes Intravenous Every 12 hours 10/09/14 1303     10/09/14 1400  meropenem (MERREM) 1 g in sodium chloride 0.9 % 100 mL IVPB     1 g200 mL/hr over 30 Minutes Intravenous 3 times per day 10/09/14 1303     10/09/14 1200  cefTRIAXone (ROCEPHIN) 1 g in dextrose 5 % 50 mL IVPB - Premix  Status:  Discontinued     1 g100 mL/hr over 30 Minutes Intravenous Every 24 hours 10/08/14 1519 10/09/14 1255   10/08/14 2200  metroNIDAZOLE (FLAGYL) IVPB 500 mg     500 mg100 mL/hr over 60 Minutes Intravenous 3 times per day 10/08/14 1520     10/08/14 1400  metroNIDAZOLE (FLAGYL) IVPB 500 mg  Status:  Discontinued     500 mg100 mL/hr over 60 Minutes Intravenous 3 times per day 10/08/14 0945 10/08/14 1516   10/08/14 1000  cefTRIAXone (ROCEPHIN) 1 g in dextrose 5 % 50 mL IVPB - Premix  Status:  Discontinued     1 g100 mL/hr over 30 Minutes Intravenous Every 24 hours 10/08/14 0945 10/08/14 1516   09/30/14 1300  micafungin (MYCAMINE) 100 mg in sodium chloride 0.9 % 100 mL IVPB    Comments:  Pharmacy may adjust as needed   100 mg100 mL/hr over 1 Hours Intravenous Every 24 hours 09/30/14 1213     09/27/14 2100  ceFEPIme (MAXIPIME) 2 g in dextrose 5 % 50 mL  IVPB  Status:  Discontinued     2 g100 mL/hr over 30 Minutes Intravenous Every 24 hours 09/27/14 0603 09/27/14 1103   09/27/14 2000  vancomycin (VANCOCIN) IVPB 1000 mg/200 mL premix  Status:  Discontinued     1,000 mg200 mL/hr over 60 Minutes Intravenous Every 24 hours 09/27/14 0603 09/27/14 1103   09/27/14 1400  vancomycin (VANCOCIN) IVPB 1000 mg/200 mL premix  Status:  Discontinued     1,000 mg200 mL/hr over 60 Minutes Intravenous Every 12 hours 09/27/14 1103 10/02/14 1348   09/27/14 1200  ceFEPIme (MAXIPIME) 2 g in dextrose 5 % 50 mL IVPB  Status:  Discontinued     2 g100 mL/hr over 30 Minutes Intravenous Every 12 hours 09/27/14 1103 10/02/14 1348   09/26/14 1930  vancomycin (VANCOCIN) IVPB 1000 mg/200 mL premix     1,000 mg200 mL/hr over 60 Minutes Intravenous  Once 09/26/14 1917 09/26/14 2210   09/26/14 1930  ceFEPIme (MAXIPIME) 2 g in dextrose 5 % 50 mL IVPB     2 g100 mL/hr over 30 Minutes Intravenous  Once 09/26/14 1917 09/26/14 2132      Medications:  Scheduled: . antiseptic oral rinse  7 mL  Mouth Rinse QID  . chlorhexidine  15 mL Mouth Rinse BID  . enoxaparin (LOVENOX) injection  70 mg Subcutaneous Q12H  . feeding supplement (VITAL HIGH PROTEIN)  1,000 mL Per Tube Q24H  . insulin aspart  0-9 Units Subcutaneous 6 times per day  . meropenem (MERREM) IV  1 g Intravenous 3 times per day  . metronidazole  500 mg Intravenous 3 times per day  . micafungin [facility]) IV  100 mg Intravenous Q24H  . pantoprazole sodium  40 mg Per Tube Q1200  . potassium chloride  10 mEq Intravenous Q1 Hr x 4  . sodium chloride  10-40 mL Intracatheter Q12H  . vancomycin  1,000 mg Intravenous Q12H    Objective: Vital signs in last 24 hours: Temp:  [98 F (36.7 C)-103.3 F (39.6 C)] 98.1 F (36.7 C) (01/02 0800) Pulse Rate:  [56-119] 103 (01/02 1000) Resp:  [12-34] 17 (01/02 1000) BP: (72-141)/(39-92) 117/62 mmHg (01/02 1000) SpO2:  [92 %-100 %] 92 % (01/02 1000) FiO2 (%):  [70 %-100 %] 70 %  (01/02 0831) Weight:  [68 kg (149 lb 14.6 oz)] 68 kg (149 lb 14.6 oz) (01/02 0407)   General appearance: no distress Resp: clear to auscultation bilaterally Cardio: regular rate and rhythm GI: abnormal findings:  hypoactive bowel sounds, midline wound, tender to palplation. mild distension.  Extremities: edema none  Lab Results  Recent Labs  10/09/14 0230 10/09/14 0949 10/10/14 0415  WBC  --  29.2* 30.3*  HGB  --  11.5* 9.8*  HCT  --  37.0 31.8*  NA 148*  --  138  K 4.3  --  3.0*  CL 108  --  104  CO2 27  --  26  BUN 20  --  23  CREATININE 0.96  --  1.07   Liver Panel  Recent Labs  10/08/14 0425 10/08/14 1645  PROT 7.8 7.3  ALBUMIN 2.3* 2.1*  AST 38* 74*  ALT 37* 53*  ALKPHOS 214* 217*  BILITOT 0.5 0.7   Sedimentation Rate No results for input(s): ESRSEDRATE in the last 72 hours. C-Reactive Protein No results for input(s): CRP in the last 72 hours.  Microbiology: Recent Results (from the past 240 hour(s))  Culture, blood (routine x 2)     Status: None   Collection Time: 10/01/14  9:24 PM  Result Value Ref Range Status   Specimen Description BLOOD LEFT HAND  Final   Special Requests BOTTLES DRAWN AEROBIC ONLY 10CC  Final   Culture   Final    NO GROWTH 5 DAYS Note: Culture results may be compromised due to an excessive volume of blood received in culture bottles. Performed at [facility]    Report Status 10/08/2014 FINAL  Final  Culture, blood (routine x 2)     Status: None   Collection Time: 10/01/14  9:35 PM  Result Value Ref Range Status   Specimen Description BLOOD RIGHT HAND  Final   Special Requests BOTTLES DRAWN AEROBIC ONLY 10CC  Final   Culture   Final    NO GROWTH 5 DAYS Note: Culture results may be compromised due to an excessive volume of blood received in culture bottles. Performed at [facility]    Report Status 10/08/2014 FINAL  Final  Urine culture     Status: None   Collection Time: 10/06/14 12:02 PM  Result Value  Ref Range Status   Specimen Description URINE, CATHETERIZED  Final   Special Requests NONE  Final  Colony Count NO GROWTH Performed at [facility]   Final   Culture NO GROWTH Performed at [facility]   Final   Report Status 10/07/2014 FINAL  Final  Clostridium Difficile by PCR     Status: None   Collection Time: 10/07/14  8:33 PM  Result Value Ref Range Status   C difficile by pcr NEGATIVE NEGATIVE Final    Studies/Results: Ct Head Wo Contrast  10/08/2014   CLINICAL DATA:  Cardiac arrest. Now with blown pupil. Initial evaluation.  EXAM: CT HEAD WITHOUT CONTRAST  TECHNIQUE: Contiguous axial images were obtained from the base of the skull through the vertex without intravenous contrast.  COMPARISON:  Prior MRI from 04/03/2014 as well as earlier studies.  FINDINGS: Cerebral volume is stable.  Focal hypodense lesions involving the globus pallidum bilaterally are stable relative to previous studies. Basal ganglia otherwise symmetric and normal in appearance. Gray-white matter differentiation is maintained. No evidence for anoxia at this time. No large vessel or cortical infarct identified. No new encephalomalacia.  No mass lesion or midline shift. No hydrocephalus. There is no extra-axial fluid collection.  Scalp soft tissues within normal limits. No acute abnormality seen about the orbits.  Nasogastric tube partially visualized. Mild opacity noted within the posterior left sphenoid sinus. Scattered opacity noted within the right mastoid air cells.  Calvarium intact.  IMPRESSION: 1. Stable appearance of the brain with no new acute intracranial abnormality identified. 2. 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   Electronically Signed   By: Jeannine Boga M.D.   On: 10/08/2014 17:53   Ct Abdomen Pelvis W Contrast  10/09/2014   CLINICAL DATA:  Septic shock, cardiac arrest and abdominal surgery for perforated small bowel and perforated gastric ulcer.   EXAM: CT ABDOMEN AND PELVIS WITH CONTRAST  TECHNIQUE: Multidetector CT imaging of the abdomen and pelvis was performed using the standard protocol following bolus administration of intravenous contrast.  CONTRAST:  80 mL Omnipaque 300 IV  COMPARISON:  10/03/2014 and 09/26/2014.  FINDINGS: Lung bases show a worsening consolidation and atelectasis involving the posterior lower lobes bilaterally, left greater than right. Small left pleural effusion has resolved.  Stable hepatic steatosis. The gallbladder, pancreas, spleen, adrenal glands and kidneys are unremarkable. Single surgical drain remains present in the upper mid abdomen without evidence of abscess or significant free fluid in the peritoneal cavity. Trace fluid is present in the lower pelvis. Midline surgical wound shows stable appearance.  Nasogastric tube extends to the level of the distal stomach. Administered oral contrast shows normal transit with no evidence of bowel obstruction or bowel perforation. No free intraperitoneal air. No hernias identified. The bladder is decompressed by a Foley catheter. Bony structures show stable fusion of the L1 and L2 vertebral bodies.  IMPRESSION: 1. Increase consolidation and atelectasis involving the posterior lower lobes bilaterally, left greater than right. Resolution of left pleural effusion. 2. No evidence of bowel obstruction, perforation or focal abscess.   Electronically Signed   By: Aletta Edouard M.D.   On: 10/09/2014 13:20   Dg Chest Port 1 View  10/10/2014   CLINICAL DATA:  Ventilator dependent respiratory failure. Hypoxia. Followup left lower lobe pneumonia.  EXAM: PORTABLE CHEST - 1 VIEW  COMPARISON:  10/09/2014 dating back to 09/29/2014.  FINDINGS: Endotracheal tube tip in satisfactory position projecting approximately 3 cm above the carina. Left jugular central venous catheter tip projects over the mid SVC, unchanged. Nasogastric tube courses below the diaphragm into the stomach though  its tip is not  included on the image.  Cardiac silhouette upper normal in size for technique and degree of inspiration, unchanged. Persistent dense consolidation in the left lower lobe, with minimal improvement in aeration since yesterday. Stable mild right basilar atelectasis. No new pulmonary parenchymal abnormalities.  IMPRESSION: 1. Support apparatus satisfactory. 2. Persistent dense left lower lobe atelectasis and/or pneumonia with minimal improvement in aeration and stable mild right basilar atelectasis. 3. No new abnormalities.   Electronically Signed   By: Evangeline Dakin M.D.   On: 10/10/2014 09:03   Portable Chest Xray  10/09/2014   CLINICAL DATA:  Acute respiratory failure.  EXAM: PORTABLE CHEST - 1 VIEW  COMPARISON:  10/08/2014  FINDINGS: Consolidation noted in the left lower lobe, not significantly changed since prior study. Improving right basilar opacity, likely improving atelectasis. Support devices are unchanged. Heart is normal size.  IMPRESSION: Stable left lower lobe atelectasis or infiltrate. Improving right basilar aeration.   Electronically Signed   By: Rolm Baptise M.D.   On: 10/09/2014 07:35     Assessment/Plan: C glabrata fungemia Gastric perforation HCAP/VDRF Ischemic limb Cardiac arrest 12-31  Total days of antibiotics: 14 (8 micafungin) Day 2 merrem/vanco  She is unchanged- WBC elevated but afebrile.  Cx are ngtd Will follow Cx tomorrow as well         Bobby Rumpf Infectious Diseases (pager) [phone] [email or web address] 10/10/2014, 11:03 AM  LOS: 14 days

## 2014-10-10 NOTE — Progress Notes (Signed)
13 Days Post-Op  Subjective: On vent  Objective: Vital signs in last 24 hours: Temp:  [98 F (36.7 C)-103.3 F (39.6 C)] 98.1 F (36.7 C) (01/02 0800) Pulse Rate:  [56-119] 80 (01/02 0900) Resp:  [12-43] 12 (01/02 0900) BP: (72-141)/(39-92) 130/62 mmHg (01/02 0800) SpO2:  [95 %-100 %] 95 % (01/02 0900) FiO2 (%):  [70 %-100 %] 70 % (01/02 0831) Weight:  [149 lb 14.6 oz (68 kg)] 149 lb 14.6 oz (68 kg) (01/02 0407) Last BM Date: 10/08/14  Intake/Output from previous day: 01/01 0701 - 01/02 0700 In: 6998.1 [I.V.:3118.9; NG/GT:920; IV Piggyback:2200; TPN:759.2] Out: 1190 [Urine:870; Emesis/NG output:300; Drains:20] Intake/Output this shift: Total I/O In: 604.5 [I.V.:344.5; NG/GT:30; IV Piggyback:50; TPN:180] Out: 175 [Urine:75; Emesis/NG output:100]  General appearance: no distress Resp: rhonchi and mild wheeze b Cardio: regular rate and rhythm and 100 GI: soft, wound clean with wet to dry, JP serous and minimal  Lab Results:   Recent Labs  10/09/14 0949 10/10/14 0415  WBC 29.2* 30.3*  HGB 11.5* 9.8*  HCT 37.0 31.8*  PLT 615* 540*   BMET  Recent Labs  10/09/14 0230 10/10/14 0415  NA 148* 138  K 4.3 3.0*  CL 108 104  CO2 27 26  GLUCOSE 144* 324*  BUN 20 23  CREATININE 0.96 1.07  CALCIUM 8.3* 7.3*   PT/INR No results for input(s): LABPROT, INR in the last 72 hours. ABG  Recent Labs  10/09/14 0847 10/09/14 1422  PHART 7.515* 7.369  HCO3 22.9 23.6    Studies/Results: Ct Head Wo Contrast  10/08/2014   CLINICAL DATA:  Cardiac arrest. Now with blown pupil. Initial evaluation.  EXAM: CT HEAD WITHOUT CONTRAST  TECHNIQUE: Contiguous axial images were obtained from the base of the skull through the vertex without intravenous contrast.  COMPARISON:  Prior MRI from 04/03/2014 as well as earlier studies.  FINDINGS: Cerebral volume is stable.  Focal hypodense lesions involving the globus pallidum bilaterally are stable relative to previous studies. Basal ganglia  otherwise symmetric and normal in appearance. Gray-white matter differentiation is maintained. No evidence for anoxia at this time. No large vessel or cortical infarct identified. No new encephalomalacia.  No mass lesion or midline shift. No hydrocephalus. There is no extra-axial fluid collection.  Scalp soft tissues within normal limits. No acute abnormality seen about the orbits.  Nasogastric tube partially visualized. Mild opacity noted within the posterior left sphenoid sinus. Scattered opacity noted within the right mastoid air cells.  Calvarium intact.  IMPRESSION: 1. Stable appearance of the brain with no new acute intracranial abnormality identified. 2. 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   Electronically Signed   By: Jeannine Boga M.D.   On: 10/08/2014 17:53   Ct Abdomen Pelvis W Contrast  10/09/2014   CLINICAL DATA:  Septic shock, cardiac arrest and abdominal surgery for perforated small bowel and perforated gastric ulcer.  EXAM: CT ABDOMEN AND PELVIS WITH CONTRAST  TECHNIQUE: Multidetector CT imaging of the abdomen and pelvis was performed using the standard protocol following bolus administration of intravenous contrast.  CONTRAST:  80 mL Omnipaque 300 IV  COMPARISON:  10/03/2014 and 09/26/2014.  FINDINGS: Lung bases show a worsening consolidation and atelectasis involving the posterior lower lobes bilaterally, left greater than right. Small left pleural effusion has resolved.  Stable hepatic steatosis. The gallbladder, pancreas, spleen, adrenal glands and kidneys are unremarkable. Single surgical drain remains present in the upper mid abdomen without evidence of abscess or significant free fluid in the  peritoneal cavity. Trace fluid is present in the lower pelvis. Midline surgical wound shows stable appearance.  Nasogastric tube extends to the level of the distal stomach. Administered oral contrast shows normal transit with no evidence of bowel obstruction or bowel  perforation. No free intraperitoneal air. No hernias identified. The bladder is decompressed by a Foley catheter. Bony structures show stable fusion of the L1 and L2 vertebral bodies.  IMPRESSION: 1. Increase consolidation and atelectasis involving the posterior lower lobes bilaterally, left greater than right. Resolution of left pleural effusion. 2. No evidence of bowel obstruction, perforation or focal abscess.   Electronically Signed   By: Aletta Edouard M.D.   On: 10/09/2014 13:20   Dg Chest Port 1 View  10/10/2014   CLINICAL DATA:  Ventilator dependent respiratory failure. Hypoxia. Followup left lower lobe pneumonia.  EXAM: PORTABLE CHEST - 1 VIEW  COMPARISON:  10/09/2014 dating back to 09/29/2014.  FINDINGS: Endotracheal tube tip in satisfactory position projecting approximately 3 cm above the carina. Left jugular central venous catheter tip projects over the mid SVC, unchanged. Nasogastric tube courses below the diaphragm into the stomach though its tip is not included on the image.  Cardiac silhouette upper normal in size for technique and degree of inspiration, unchanged. Persistent dense consolidation in the left lower lobe, with minimal improvement in aeration since yesterday. Stable mild right basilar atelectasis. No new pulmonary parenchymal abnormalities.  IMPRESSION: 1. Support apparatus satisfactory. 2. Persistent dense left lower lobe atelectasis and/or pneumonia with minimal improvement in aeration and stable mild right basilar atelectasis. 3. No new abnormalities.   Electronically Signed   By: Evangeline Dakin M.D.   On: 10/10/2014 09:03   Portable Chest Xray  10/09/2014   CLINICAL DATA:  Acute respiratory failure.  EXAM: PORTABLE CHEST - 1 VIEW  COMPARISON:  10/08/2014  FINDINGS: Consolidation noted in the left lower lobe, not significantly changed since prior study. Improving right basilar opacity, likely improving atelectasis. Support devices are unchanged. Heart is normal size.   IMPRESSION: Stable left lower lobe atelectasis or infiltrate. Improving right basilar aeration.   Electronically Signed   By: Rolm Baptise M.D.   On: 10/09/2014 07:35   Dg Chest Port 1 View  10/08/2014   CLINICAL DATA:  Cardiac arrest.  Respiratory distress.  EXAM: PORTABLE CHEST - 1 VIEW  COMPARISON:  Earlier the same day at 01:47 hr  FINDINGS: 0932 hr: Interval placement of endotracheal tube 4.4 cm from the carina. The left internal jugular central line is been minimally retracted, tip remains in the proximal SVC. Enteric tube in place, tip and side port below the diaphragm. There is increasing pleural parenchymal opacity at the left lung base. Unchanged linear opacities at the right lung base. Cardiomediastinal contours are unchanged. No evidence of pneumothorax.  IMPRESSION: 1. Endotracheal tube 4.4 cm from the carina. Left internal jugular venous catheter mildly retracted from prior, tip remains in the proximal SVC. 2. Increasing left basilar pleural parenchymal opacity.   Electronically Signed   By: Jeb Levering M.D.   On: 10/08/2014 10:02    Anti-infectives: Anti-infectives    Start     Dose/Rate Route Frequency Ordered Stop   10/09/14 1400  vancomycin (VANCOCIN) IVPB 1000 mg/200 mL premix     1,000 mg200 mL/hr over 60 Minutes Intravenous Every 12 hours 10/09/14 1303     10/09/14 1400  meropenem (MERREM) 1 g in sodium chloride 0.9 % 100 mL IVPB     1 g200 mL/hr over 30 Minutes  Intravenous 3 times per day 10/09/14 1303     10/09/14 1200  cefTRIAXone (ROCEPHIN) 1 g in dextrose 5 % 50 mL IVPB - Premix  Status:  Discontinued     1 g100 mL/hr over 30 Minutes Intravenous Every 24 hours 10/08/14 1519 10/09/14 1255   10/08/14 2200  metroNIDAZOLE (FLAGYL) IVPB 500 mg     500 mg100 mL/hr over 60 Minutes Intravenous 3 times per day 10/08/14 1520     10/08/14 1400  metroNIDAZOLE (FLAGYL) IVPB 500 mg  Status:  Discontinued     500 mg100 mL/hr over 60 Minutes Intravenous 3 times per day 10/08/14 0945  10/08/14 1516   10/08/14 1000  cefTRIAXone (ROCEPHIN) 1 g in dextrose 5 % 50 mL IVPB - Premix  Status:  Discontinued     1 g100 mL/hr over 30 Minutes Intravenous Every 24 hours 10/08/14 0945 10/08/14 1516   09/30/14 1300  micafungin (MYCAMINE) 100 mg in sodium chloride 0.9 % 100 mL IVPB    Comments:  Pharmacy may adjust as needed   100 mg100 mL/hr over 1 Hours Intravenous Every 24 hours 09/30/14 1213     09/27/14 2100  ceFEPIme (MAXIPIME) 2 g in dextrose 5 % 50 mL IVPB  Status:  Discontinued     2 g100 mL/hr over 30 Minutes Intravenous Every 24 hours 09/27/14 0603 09/27/14 1103   09/27/14 2000  vancomycin (VANCOCIN) IVPB 1000 mg/200 mL premix  Status:  Discontinued     1,000 mg200 mL/hr over 60 Minutes Intravenous Every 24 hours 09/27/14 0603 09/27/14 1103   09/27/14 1400  vancomycin (VANCOCIN) IVPB 1000 mg/200 mL premix  Status:  Discontinued     1,000 mg200 mL/hr over 60 Minutes Intravenous Every 12 hours 09/27/14 1103 10/02/14 1348   09/27/14 1200  ceFEPIme (MAXIPIME) 2 g in dextrose 5 % 50 mL IVPB  Status:  Discontinued     2 g100 mL/hr over 30 Minutes Intravenous Every 12 hours 09/27/14 1103 10/02/14 1348   09/26/14 1930  vancomycin (VANCOCIN) IVPB 1000 mg/200 mL premix     1,000 mg200 mL/hr over 60 Minutes Intravenous  Once 09/26/14 1917 09/26/14 2210   09/26/14 1930  ceFEPIme (MAXIPIME) 2 g in dextrose 5 % 50 mL IVPB     2 g100 mL/hr over 30 Minutes Intravenous  Once 09/26/14 1917 09/26/14 2132      Assessment/Plan: S/P repair perf pre-pyloric ulcer - resume TF and D/C JP, CT looks good Vent dependent resp failure -  per CCM Fungemia and PNA with sepsis - Micafungin and ABX per CCM S/P thrombectomy 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

## 2014-10-10 NOTE — Progress Notes (Signed)
eLink Physician-Brief Progress Note Patient Name: [name] DOB: 27-Nov-1965 MRN: [id number]   Date of Service  10/10/2014  HPI/Events of Note  ECg ST Looking at monitor throughout last hour, may be wanting to go into fib with losing p waves intermittent but not defined  eICU Interventions  No hep as abdo surgery Control rate  Which is currently NOT an issue     Intervention Category Major Interventions: Arrhythmia - evaluation and management  Alanah Sakuma J. 10/10/2014, 12:58 AM

## 2014-10-10 NOTE — Progress Notes (Signed)
[facility]  Subjective  - sedated on vent   Objective 128/74 72 98 F (36.7 C) (Oral) 21 100%  Intake/Output Summary (Last 24 hours) at 10/10/14 0818 Last data filed at 10/10/14 0645  Gross per 24 hour  Intake 6789.28 ml  Output   1160 ml  Net 5629.28 ml  On Levophed and Vasopressin Left arm incisions healing 2+ radial pulse Right hand digits 2 3 dusky at tip monophasic doppler   Assessment/Planning: Septic shock requiring more pressor today than yesterday Left hand viable Right hand with doppler flow but digits 2 3 marginal  Continue to follow  [name],[name] 10/10/2014 8:18 AM --  Laboratory Lab Results:  Recent Labs  10/09/14 0949 10/10/14 0415  WBC 29.2* 30.3*  HGB 11.5* 9.8*  HCT 37.0 31.8*  PLT 615* 540*   BMET  Recent Labs  10/09/14 0230 10/10/14 0415  NA 148* 138  K 4.3 3.0*  CL 108 104  CO2 27 26  GLUCOSE 144* 324*  BUN 20 23  CREATININE 0.96 1.07  CALCIUM 8.3* 7.3*    COAG Lab Results  Component Value Date   INR 1.25 10/05/2014   No results found for: PTT

## 2014-10-10 NOTE — Progress Notes (Signed)
PULMONARY / CRITICAL CARE MEDICINE HISTORY AND PHYSICAL EXAMINATION   Name: [name] MRN: [id number] DOB: 08-19-1966    ADMISSION DATE:  09/26/2014  PRIMARY SERVICE: PCCM  CHIEF COMPLAINT:  Abd pain  BRIEF PATIENT DESCRIPTION: 24yof smoker with PMH of MS, depression presented 12/19 on transfer from [facility] with abd pain, found to have pneumoperitoneum 2/2 small bowel rupture, also with new cold L hand.  Required surgery for DU perf & left brachial bypass, fungemia noted on admission cultures.  SIGNIFICANT EVENTS / STUDIES:  09/26/14: Admitted, CT Abd with pneumoperitoneum, cold L hand concerning for arterial clot as well. 12/20 > ex-lap [name]) with omental patch for perforated prepyloric gastric ulcer; L brachial and ulnar artery embolectomy [name]) 12/20 LE doppler >>neg 12/20 >>Left brachial to left radial artery bypass with left leg greater saphenous vein for Recurrent occlusion  12/20 Self extubated - had to be reintubated 12/21 echo >>nml LVEF 12/26 CT abdomen >> pelvic fluid 12/28 TEE neg Oct 15, 2023 reintubated, coded 4 minutes; CT head without acute change 1/1 High dose pressors, CT abdomen> no fluid collection noted; LLL pneumonia  LINES / TUBES: R IJ CVL 12/20 >> 12/29 LIJ CVL 12/28 >> JP drain RUQ 12/20 >>  ETT 12/19 >> 12/29, 12/31>>  SUBJECTIVE:  Severe shock on 1/1, requiring high dose pressors, CT abdomen without evidence of abdominal infection  VITAL SIGNS: Temp:  [98 F (36.7 C)-100.7 F (38.2 C)] 98.1 F (36.7 C) (01/02 0800) Pulse Rate:  [56-119] 79 (01/02 1200) Resp:  [12-29] 16 (01/02 1200) BP: (94-141)/(39-92) 136/75 mmHg (01/02 1200) SpO2:  [92 %-100 %] 98 % (01/02 1200) FiO2 (%):  [70 %-100 %] 70 % (01/02 0831) Weight:  [68 kg (149 lb 14.6 oz)] 68 kg (149 lb 14.6 oz) (01/02 0407) HEMODYNAMICS: CVP:  [7 mmHg-11 mmHg] 11 mmHg  Vent Mode:  [-] PRVC FiO2 (%):  [70 %-100 %] 70 % Set Rate:  [18 bmp] 18 bmp Vt Set:  [500 mL] 500 mL PEEP:   [5 cmH20] 5 cmH20 Plateau Pressure:  [11 cmH20-34 cmH20] 11 cmH20 INTAKE / OUTPUT: Intake/Output      01/01 0701 - 01/02 0700 01/02 0701 - 01/03 0700   I.V. (mL/kg) 3118.9 (45.9) 490.6 (7.2)   NG/GT 920 30   IV Piggyback 2200 150   TPN 759.2 240   Total Intake(mL/kg) 6998.1 (102.9) 910.6 (13.4)   Urine (mL/kg/hr) 870 (0.5) 240 (0.7)   Emesis/NG output 300 (0.2) 100 (0.3)   Drains 20 (0)    Total Output 1190 340   Net +5808.1 +570.6          PHYSICAL EXAMINATION:  Gen: cough frequently, stirs to touch HEENT: NCAT, ETT in place PULM: CTA B CV: Tachy, regular AB: nontender Derm: mottled, cool fingers R hand Neuro: eyes open but does not track, pupillary light response intact, cough/gag noted; extensor posturing? feet (not flaccid)  LABS: I have reviewed all of today's lab results. Relevant abnormalities are discussed in the A/P section  CXR 1/2 > ETT and CVL in place, atelectasis L > R base  ASSESSMENT / PLAN:  Principal Problem:   Pneumoperitoneum Active Problems:   Sepsis   Altered mental state   Brachial artery occlusion   Acute respiratory failure with hypoxia   Severe sepsis   Perforated gastric ulcer   Acute respiratory failure   Artery occlusion   Blood poisoning   Candidemia   Screen for STD (sexually transmitted disease)   Abdominal abscess   Septic  thrombophlebitis   Elevated WBCs   Fever   Limb ischemia   HCAP (healthcare-associated pneumonia)   Septic shock   PULMONARY A: ETT 12/19 >>12/29, 12/31 >>  Acute respiratory failure with hypoxemia > due to HCAP Difficult airway 12/31 due to cord edema P:   Continue full vent support Sedate to RASS -2 for vent synchrony given profound hypoxemia Wean FiO2 for SpO2 > 92%  CARDIOVASCULAR A: Sinus tachycardia, reactive Shock 1/1 > septic? Cardiogenic? LVEF 65% on 12/28 Demand ischemia 1/1 Brachial artery occlusion, s/p bypass, TEE neg 1/2 tachy arrhythmia > PAC frequent P:   Hold labetalol Add  aspirin Monitor CVP Keep K > 4, Mg > 2  Continue full dose lovenox at this point, long term would transition to DOAC   RENAL A: AKI, resolved Hypokalemia P:   Monitor BMET intermittently Monitor I/Os Correct electrolytes as indicated Hold lasix 1/1   GASTROINTESTINAL A: Perforated gastric ulcer and pneumoperitoneum  Post laparotomy P:   Post op care per surgery Cont TPN, TFs at goal Cont SUP  HEMATOLOGIC A: Arterial clot as noted above P:   lovenox Rx doses Monitor CBC intermittently Transfuse per usual ICU guidelines  INFECTIOUS A: Peritonitis after bowel perforation Septic shock 1/1 > HCAP, procalcitonin 38.5 candidemia> ID plans 6 weeks of therapy P:   Blood cx: 09/26/14-->  NGTD, 12/24 >> ng 12/20 peritoneal fluid >> few yeast  12/31 blood > 12/31 resp >  1/1 procalcitonin >   Vanc: 12/19 >> 12/25 Zosyn:  12/19 >> 12/25 Micafungin 12/23 >>  Ceftriaxone 12/31 >1/1 Flagyl 12/31 >  Vanc 1/1 > Meropenem 1/1 >  ENDOCRINE A:  Hypoglycemia, resolved Intermittent hyperglycemia P:   Cont SSI -resistant  NEUROLOGIC A: Post op pain Acute encephalopathy 1/1 > worrisome for anoxic injury but difficult to tell in setting of shock; CT head negative History of Guillian Barre syndrome P:   RASS goal -2 Fentanyl gtt, versed prn Check EEG, may need neurology consult   Social / Family: Family updated at bedside 1/2  TODAY'S SUMMARY: Worsening shock, hypoxemia after cardiac arrest 12/31, worsening septic shock from HCAP since then.  Relatively stable 1/2 but critically ill.  Check EEG given encephalopathy. Overall condition is critical, prognosis guarded at this point.    The patient is critically ill with multiple organ systems failure and requires high complexity decision making for assessment and support, frequent evaluation and titration of therapies, application of advanced monitoring technologies and extensive interpretation of multiple databases.  Critical Care Time devoted to patient care services described in this note independent of APP time is 40 minutes.    Roselie Awkward, MD [facility] Pager: [phone] Cell: [phone] If no response, call [phone]

## 2014-10-11 ENCOUNTER — Inpatient Hospital Stay (HOSPITAL_COMMUNITY): Payer: BLUE CROSS/BLUE SHIELD

## 2014-10-11 DIAGNOSIS — J15212 Pneumonia due to Methicillin resistant Staphylococcus aureus: Secondary | ICD-10-CM

## 2014-10-11 DIAGNOSIS — B379 Candidiasis, unspecified: Secondary | ICD-10-CM

## 2014-10-11 LAB — COMPREHENSIVE METABOLIC PANEL
ALBUMIN: 2 g/dL — AB (ref 3.5–5.2)
ALT: 226 U/L — ABNORMAL HIGH (ref 0–35)
ANION GAP: 2 — AB (ref 5–15)
AST: 294 U/L — ABNORMAL HIGH (ref 0–37)
Alkaline Phosphatase: 128 U/L — ABNORMAL HIGH (ref 39–117)
BUN: 18 mg/dL (ref 6–23)
CO2: 27 mmol/L (ref 19–32)
Calcium: 7.6 mg/dL — ABNORMAL LOW (ref 8.4–10.5)
Chloride: 108 mEq/L (ref 96–112)
Creatinine, Ser: 0.75 mg/dL (ref 0.50–1.10)
GFR calc non Af Amer: >90 mL/min (ref >90)
Glucose, Bld: 315 mg/dL — ABNORMAL HIGH (ref 70–99)
Potassium: 4.3 mmol/L (ref 3.5–5.1)
Sodium: 137 mmol/L (ref 135–145)
Total Bilirubin: 0.7 mg/dL (ref 0.3–1.2)
Total Protein: 6.8 g/dL (ref 6.0–8.3)

## 2014-10-11 LAB — CORTISOL: CORTISOL PLASMA: 15.5 ug/dL

## 2014-10-11 LAB — CBC WITH DIFFERENTIAL/PLATELET
BASOS PCT: 0 % (ref 0–1)
Basophils Absolute: 0 10*3/uL (ref 0.0–0.1)
Eosinophils Absolute: 0 10*3/uL (ref 0.0–0.7)
Eosinophils Relative: 0 % (ref 0–5)
HCT: 27.5 % — ABNORMAL LOW (ref 36.0–46.0)
Hemoglobin: 8.3 g/dL — ABNORMAL LOW (ref 12.0–15.0)
LYMPHS ABS: 1.3 10*3/uL (ref 0.7–4.0)
LYMPHS PCT: 6 % — AB (ref 12–46)
MCH: 30.9 pg (ref 26.0–34.0)
MCHC: 30.2 g/dL (ref 30.0–36.0)
MCV: 102.2 fL — ABNORMAL HIGH (ref 78.0–100.0)
Monocytes Absolute: 0.6 10*3/uL (ref 0.1–1.0)
Monocytes Relative: 3 % (ref 3–12)
Neutro Abs: 19.2 10*3/uL — ABNORMAL HIGH (ref 1.7–7.7)
Neutrophils Relative %: 91 % — ABNORMAL HIGH (ref 43–77)
Platelets: 453 10*3/uL — ABNORMAL HIGH (ref 150–400)
RBC: 2.69 MIL/uL — ABNORMAL LOW (ref 3.87–5.11)
RDW: 14.5 % (ref 11.5–15.5)
WBC: 21.1 10*3/uL — ABNORMAL HIGH (ref 4.0–10.5)

## 2014-10-11 LAB — CULTURE, RESPIRATORY W GRAM STAIN

## 2014-10-11 LAB — GLUCOSE, CAPILLARY
GLUCOSE-CAPILLARY: 169 mg/dL — AB (ref 70–99)
GLUCOSE-CAPILLARY: 259 mg/dL — AB (ref 70–99)
GLUCOSE-CAPILLARY: 269 mg/dL — AB (ref 70–99)
Glucose-Capillary: 234 mg/dL — ABNORMAL HIGH (ref 70–99)
Glucose-Capillary: 298 mg/dL — ABNORMAL HIGH (ref 70–99)
Glucose-Capillary: 205 mg/dL — ABNORMAL HIGH (ref 70–99)
Glucose-Capillary: 153 mg/dL — ABNORMAL HIGH (ref 70–99)

## 2014-10-11 LAB — PROCALCITONIN: PROCALCITONIN: 19.7 ng/mL

## 2014-10-11 LAB — CULTURE, BLOOD (ROUTINE X 2)

## 2014-10-11 LAB — MAGNESIUM: Magnesium: 2.1 mg/dL (ref 1.5–2.5)

## 2014-10-11 LAB — PHOSPHORUS: Phosphorus: 2 mg/dL — ABNORMAL LOW (ref 2.3–4.6)

## 2014-10-11 MED ORDER — INSULIN GLARGINE 100 UNIT/ML ~~LOC~~ SOLN
10.0000 [IU] | Freq: Every day | SUBCUTANEOUS | Status: DC
  Administered 2014-10-11 – 2014-10-18 (×7): 10 [IU] via SUBCUTANEOUS
  Filled 2014-10-11 (×8): qty 0.1

## 2014-10-11 MED ORDER — HYDROCORTISONE NA SUCCINATE PF 100 MG IJ SOLR
50.0000 mg | Freq: Two times a day (BID) | INTRAMUSCULAR | Status: DC
  Administered 2014-10-11 – 2014-10-13 (×4): 50 mg via INTRAVENOUS
  Filled 2014-10-11 (×6): qty 1

## 2014-10-11 NOTE — Progress Notes (Signed)
14 Days Post-Op  Subjective: Pt opens eyes but not following commands on vent  Objective: Vital signs in last 24 hours: Temp:  [97.3 F (36.3 C)-98.9 F (37.2 C)] 98 F (36.7 C) (01/03 0400) Pulse Rate:  [71-118] 87 (01/03 0758) Resp:  [11-73] 13 (01/03 0758) BP: (78-148)/(49-134) 114/86 mmHg (01/03 0758) SpO2:  [89 %-100 %] 93 % (01/03 0715) FiO2 (%):  [40 %-70 %] 40 % (01/03 0758) Weight:  [156 lb 15.5 oz (71.2 kg)] 156 lb 15.5 oz (71.2 kg) (01/03 0419) Last BM Date: 10/08/14  Intake/Output from previous day: 01/02 0701 - 01/03 0700 In: 5619.6 [I.V.:2216; NG/GT:793.7; IV Piggyback:1250; TPN:1360] Out: 1265 [Urine:1095; Emesis/NG output:150; Drains:20] Intake/Output this shift:    Incision/Wound:open clean  Drain out abdomen soft   Lab Results:   Recent Labs  10/10/14 0415 10/11/14 0400  WBC 30.3* 21.1*  HGB 9.8* 8.3*  HCT 31.8* 27.5*  PLT 540* 453*   BMET  Recent Labs  10/10/14 1708 10/11/14 0400  NA 140 137  K 4.3 4.3  CL 106 108  CO2 28 27  GLUCOSE 254* 315*  BUN 16 18  CREATININE 0.84 0.75  CALCIUM 7.7* 7.6*   PT/INR No results for input(s): LABPROT, INR in the last 72 hours. ABG  Recent Labs  10/09/14 0847 10/09/14 1422  PHART 7.515* 7.369  HCO3 22.9 23.6    Studies/Results: Ct Abdomen Pelvis W Contrast  10/09/2014   CLINICAL DATA:  Septic shock, cardiac arrest and abdominal surgery for perforated small bowel and perforated gastric ulcer.  EXAM: CT ABDOMEN AND PELVIS WITH CONTRAST  TECHNIQUE: Multidetector CT imaging of the abdomen and pelvis was performed using the standard protocol following bolus administration of intravenous contrast.  CONTRAST:  80 mL Omnipaque 300 IV  COMPARISON:  10/03/2014 and 09/26/2014.  FINDINGS: Lung bases show a worsening consolidation and atelectasis involving the posterior lower lobes bilaterally, left greater than right. Small left pleural effusion has resolved.  Stable hepatic steatosis. The gallbladder,  pancreas, spleen, adrenal glands and kidneys are unremarkable. Single surgical drain remains present in the upper mid abdomen without evidence of abscess or significant free fluid in the peritoneal cavity. Trace fluid is present in the lower pelvis. Midline surgical wound shows stable appearance.  Nasogastric tube extends to the level of the distal stomach. Administered oral contrast shows normal transit with no evidence of bowel obstruction or bowel perforation. No free intraperitoneal air. No hernias identified. The bladder is decompressed by a Foley catheter. Bony structures show stable fusion of the L1 and L2 vertebral bodies.  IMPRESSION: 1. Increase consolidation and atelectasis involving the posterior lower lobes bilaterally, left greater than right. Resolution of left pleural effusion. 2. No evidence of bowel obstruction, perforation or focal abscess.   Electronically Signed   By: Aletta Edouard M.D.   On: 10/09/2014 13:20   Dg Chest Port 1 View  10/11/2014   CLINICAL DATA:  Acute respiratory failure with hypoxia.  EXAM: PORTABLE CHEST - 1 VIEW  COMPARISON:  1 day prior  FINDINGS: Support apparatus: Endotracheal tube 2.7 cm above carina. Left internal jugular line tip mid SVC. Nasogastric tube terminates at the distal stomach. Numerous leads and wires project over the chest. Lower cervical spine fixation.  Cardiomediastinal silhouette: Normal heart size.  Pleura: No right and no definite left pleural effusion. No pneumothorax.  Lungs: Low lung volumes. Similar left and developing right base airspace disease.  Other: Contrast within left upper abdominal bowel loops.  IMPRESSION: Similar left and  developing right base airspace disease. Favor atelectasis.  Low lung volumes.   Electronically Signed   By: Abigail Miyamoto M.D.   On: 10/11/2014 07:51   Dg Chest Port 1 View  10/10/2014   CLINICAL DATA:  Ventilator dependent respiratory failure. Hypoxia. Followup left lower lobe pneumonia.  EXAM: PORTABLE CHEST - 1  VIEW  COMPARISON:  10/09/2014 dating back to 09/29/2014.  FINDINGS: Endotracheal tube tip in satisfactory position projecting approximately 3 cm above the carina. Left jugular central venous catheter tip projects over the mid SVC, unchanged. Nasogastric tube courses below the diaphragm into the stomach though its tip is not included on the image.  Cardiac silhouette upper normal in size for technique and degree of inspiration, unchanged. Persistent dense consolidation in the left lower lobe, with minimal improvement in aeration since yesterday. Stable mild right basilar atelectasis. No new pulmonary parenchymal abnormalities.  IMPRESSION: 1. Support apparatus satisfactory. 2. Persistent dense left lower lobe atelectasis and/or pneumonia with minimal improvement in aeration and stable mild right basilar atelectasis. 3. No new abnormalities.   Electronically Signed   By: Evangeline Dakin M.D.   On: 10/10/2014 09:03    Anti-infectives: Anti-infectives    Start     Dose/Rate Route Frequency Ordered Stop   10/09/14 1400  vancomycin (VANCOCIN) IVPB 1000 mg/200 mL premix     1,000 mg200 mL/hr over 60 Minutes Intravenous Every 12 hours 10/09/14 1303     10/09/14 1400  meropenem (MERREM) 1 g in sodium chloride 0.9 % 100 mL IVPB     1 g200 mL/hr over 30 Minutes Intravenous 3 times per day 10/09/14 1303     10/09/14 1200  cefTRIAXone (ROCEPHIN) 1 g in dextrose 5 % 50 mL IVPB - Premix  Status:  Discontinued     1 g100 mL/hr over 30 Minutes Intravenous Every 24 hours 10/08/14 1519 10/09/14 1255   10/08/14 2200  metroNIDAZOLE (FLAGYL) IVPB 500 mg     500 mg100 mL/hr over 60 Minutes Intravenous 3 times per day 10/08/14 1520     10/08/14 1400  metroNIDAZOLE (FLAGYL) IVPB 500 mg  Status:  Discontinued     500 mg100 mL/hr over 60 Minutes Intravenous 3 times per day 10/08/14 0945 10/08/14 1516   10/08/14 1000  cefTRIAXone (ROCEPHIN) 1 g in dextrose 5 % 50 mL IVPB - Premix  Status:  Discontinued     1 g100 mL/hr over  30 Minutes Intravenous Every 24 hours 10/08/14 0945 10/08/14 1516   09/30/14 1300  micafungin (MYCAMINE) 100 mg in sodium chloride 0.9 % 100 mL IVPB    Comments:  Pharmacy may adjust as needed   100 mg100 mL/hr over 1 Hours Intravenous Every 24 hours 09/30/14 1213     09/27/14 2100  ceFEPIme (MAXIPIME) 2 g in dextrose 5 % 50 mL IVPB  Status:  Discontinued     2 g100 mL/hr over 30 Minutes Intravenous Every 24 hours 09/27/14 0603 09/27/14 1103   09/27/14 2000  vancomycin (VANCOCIN) IVPB 1000 mg/200 mL premix  Status:  Discontinued     1,000 mg200 mL/hr over 60 Minutes Intravenous Every 24 hours 09/27/14 0603 09/27/14 1103   09/27/14 1400  vancomycin (VANCOCIN) IVPB 1000 mg/200 mL premix  Status:  Discontinued     1,000 mg200 mL/hr over 60 Minutes Intravenous Every 12 hours 09/27/14 1103 10/02/14 1348   09/27/14 1200  ceFEPIme (MAXIPIME) 2 g in dextrose 5 % 50 mL IVPB  Status:  Discontinued     2 g100 mL/hr  over 30 Minutes Intravenous Every 12 hours 09/27/14 1103 10/02/14 1348   09/26/14 1930  vancomycin (VANCOCIN) IVPB 1000 mg/200 mL premix     1,000 mg200 mL/hr over 60 Minutes Intravenous  Once 09/26/14 1917 09/26/14 2210   09/26/14 1930  ceFEPIme (MAXIPIME) 2 g in dextrose 5 % 50 mL IVPB     2 g100 mL/hr over 30 Minutes Intravenous  Once 09/26/14 1917 09/26/14 2132      Assessment/Plan: Patient Active Problem List   Diagnosis Date Noted  . Demand ischemia 10/10/2014  . Septic shock   . Limb ischemia   . HCAP (healthcare-associated pneumonia)   . Elevated WBCs   . Fever   . Abdominal abscess   . Septic thrombophlebitis   . Acute respiratory failure   . Artery occlusion   . Blood poisoning   . Candidemia   . Screen for STD (sexually transmitted disease)   . 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 Acute respiratory failure with hypoxia 09/27/2014  . Severe sepsis 09/27/2014  . Perforated gastric ulcer 09/27/2014  . Sepsis 09/26/2014  . Altered mental state 09/26/2014  .  Pneumoperitoneum 09/26/2014  . Prediabetes 07/23/2014  . Unspecified hereditary and idiopathic peripheral neuropathy 04/15/2014  . Overdose 04/02/2014  . CVA (cerebral infarction) 04/02/2014  . Leukocytosis 04/02/2014  . Hypertriglyceridemia 12/11/2013  . Stiffness of joint, not elsewhere classified, ankle and foot 11/12/2013  . Lack of coordination 11/03/2013  . Pernicious anemia 09/12/2013  . Hyperglycemia 09/12/2013  . Elevated transaminase level 09/12/2013  . Weakness 09/12/2013  . Ataxia 09/09/2013  . Difficulty in walking(719.7) 09/09/2013  . Weakness of both legs 09/09/2013  . UTI (urinary tract infection) 02/11/2013  . Generalized weakness 02/11/2013  . Tobacco abuse 01/14/2013  . Bilateral leg edema 01/14/2013  . Pedal edema 01/13/2013  . Chronic pain syndrome 12/30/2012  . Reactive airway disease 12/30/2012  . Encephalopathy acute 07/25/2011  . 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   2 weeks s/p closure of gastric ulcer Pneumonia  Surgically stable Opening eyes but not sure neurologic sequela given cardiac arrest On TF   LOS: 15 days    Kiele Heavrin A. 10/11/2014

## 2014-10-11 NOTE — Progress Notes (Signed)
[facility]  Subjective  - opens eyes   Objective 114/86 87 98 F (36.7 C) (Oral) 13 93%  Intake/Output Summary (Last 24 hours) at 10/11/14 0819 Last data filed at 10/11/14 0700  Gross per 24 hour  Intake 5271.22 ml  Output   1090 ml  Net 4181.22 ml   Less levophed today still on vasopressin Right hand less dusky, tip of 4th finger dusky but overall hand appears viable 2+ left radial pulse, incisions healing left wrist antecubital area  Assessment/Planning: Patent left radial pulse Viable right hand Septic but less pressor support than yesterday  FIELDS,CHARLES E 10/11/2014 8:19 AM --  Laboratory Lab Results:  Recent Labs  10/10/14 0415 10/11/14 0400  WBC 30.3* 21.1*  HGB 9.8* 8.3*  HCT 31.8* 27.5*  PLT 540* 453*   BMET  Recent Labs  10/10/14 1708 10/11/14 0400  NA 140 137  K 4.3 4.3  CL 106 108  CO2 28 27  GLUCOSE 254* 315*  BUN 16 18  CREATININE 0.84 0.75  CALCIUM 7.7* 7.6*    COAG Lab Results  Component Value Date   INR 1.25 10/05/2014   No results found for: PTT

## 2014-10-11 NOTE — Progress Notes (Signed)
PARENTERAL NUTRITION CONSULT NOTE - FOLLOW UP  Pharmacy Consult for TPN Indication: S/P exp lap for perforated gastric ulcer  Allergies  Allergen Reactions  . Ambien [Zolpidem Tartrate] Other (See Comments)    Crazy dreams  . Ceftin [Cefuroxime Axetil] Nausea And Vomiting    Pt tolerated Cefepime 09/2014 admission  . Hctz [Hydrochlorothiazide] Other (See Comments)    Urinary retention  . Hydrocodone Nausea Only  . Lodine [Etodolac] Hives  . Promethazine Other (See Comments)    Hallucinations.   . Roxicodone [Oxycodone Hcl] Swelling  . Codeine Rash  . Latex Rash  . Penicillins Rash    Patient Measurements: Height: 4\' 9"  (144.8 cm) Weight: 156 lb 15.5 oz (71.2 kg) IBW/kg (Calculated) : 38.6 Adjusted Body Weight:  Usual Weight:   Vital Signs: Temp: 96.4 F (35.8 C) (01/03 0800) Temp Source: Axillary (01/03 0800) BP: 94/57 mmHg (01/03 0915) Pulse Rate: 71 (01/03 0915) Intake/Output from previous day: 01/02 0701 - 01/03 0700 In: 5619.6 [I.V.:2216; NG/GT:793.7; IV Piggyback:1250; TPN:1360] Out: 1265 [Urine:1095; Emesis/NG output:150; Drains:20] Intake/Output from this shift: Total I/O In: 345.1 [I.V.:115.1; NG/GT:130; TPN:100] Out: 190 [Urine:190]  Labs:  Recent Labs  10/09/14 0949 10/10/14 0415 10/11/14 0400  WBC 29.2* 30.3* 21.1*  HGB 11.5* 9.8* 8.3*  HCT 37.0 31.8* 27.5*  PLT 615* 540* 453*     Recent Labs  10/08/14 1645 10/09/14 0230 10/10/14 0415 10/10/14 1708 10/11/14 0400  NA 148* 148* 138 140 137  K 2.8* 4.3 3.0* 4.3 4.3  CL 103 108 104 106 108  CO2 35* 27 26 28 27   GLUCOSE 144* 144* 324* 254* 315*  BUN 19 20 23 16 18   CREATININE 0.91 0.96 1.07 0.84 0.75  CALCIUM 7.8* 8.3* 7.3* 7.7* 7.6*  MG 2.1 2.2 2.2 2.0 2.1  PHOS  --  3.3 2.5  --  2.0*  PROT 7.3  --   --   --  6.8  ALBUMIN 2.1*  --   --   --  2.0*  AST 74*  --   --   --  294*  ALT 53*  --   --   --  226*  ALKPHOS 217*  --   --   --  128*  BILITOT 0.7  --   --   --  0.7    Estimated Creatinine Clearance: 69.3 mL/min (by C-G formula based on Cr of 0.75).    Recent Labs  10/11/14 0411 10/11/14 0415 10/11/14 0857  GLUCAP 298* 234* 259*    Medications:  Scheduled:  . antiseptic oral rinse  7 mL Mouth Rinse QID  . aspirin  325 mg Oral Daily  . chlorhexidine  15 mL Mouth Rinse BID  . enoxaparin (LOVENOX) injection  70 mg Subcutaneous Q12H  . hydrocortisone sodium succinate  50 mg Intravenous Q6H  . insulin aspart  0-9 Units Subcutaneous 6 times per day  . meropenem (MERREM) IV  1 g Intravenous 3 times per day  . metronidazole  500 mg Intravenous 3 times per day  . micafungin [facility]) IV  100 mg Intravenous Q24H  . pantoprazole sodium  40 mg Per Tube Q1200  . sodium chloride  10-40 mL Intracatheter Q12H  . vancomycin  1,000 mg Intravenous Q12H    Insulin Requirements: 21 units Novolog SSI/24hr  Nutritional Requirements: [phone] Kcal with 60-65g protein per RD recommendations (22-25 kcals/kg ideal body weight) based on ASPEN guidelines for hypocaloric, high protein feeding in critically ill obese individuals  Current Nutrition: Clinimix E 5/15  infusing at 50 ml/hr and lipids at 10 ml/hr  Admit:  Admitted 12/20 with abd pain with vomiting/fevers. (+)pneumoperitoneum 2nd SB rupture.  Spoke with Dr. Barry Dienes on 12/31 re: plan to d/c TPN and given order to d/c.  TF was held for re-intubation and not resumed, to continue TPN.  GI: S/P exp lap/oversew of perforated pre-pyloric gastric ulcer; omental patch 12/20. (+)Hx GERD.  12/26 CT(+)pelvic fluid. PPI IV. Prealbumin 3.6 at baseline (low but noted recent surgery). Prealb 12/28= 5.8 TF back to goal rate   Will wean TPN today  Endo: Borderline DM.Insulin removed from TPN due to hypoglycemia CBGs 234-298 with trend up   May need to change to moderate or resistant scale per diabetes coordinator recommendations Lytes: Na 137 K 4.3, mag 2.1, phos 2.0  Renal:  Cr 0.75  UOP 0.5 ml/kg/hr.  I/O  (+)6L yesterday  Pulm: Hx COPD, chronic bronchitis. Acute resp failure, re-intubated 12/31  Cards:requiring NE and vasopressin   AC: S/P ulnar artery embolectomy 12/20, on Heparin IV.  Hepatobil: LFTs elevated T bili 0.7 . TG 222  Neuro: Hx MS, depression, periph neuropathy. Fentanyl and versed drip  ID: Micafungin (12/23 >> ) for yeast in blood and peritoneal fluid. Reintubated 12/30 with suspicion of septic shock  Tm 103.3, WBC 30.3 New CVC placed 12/28   Best Practices: SCDs TPN Access: R-IJ CVL, L-IJ CVC 12/28 TPN day#: 12/23 >>  Plan: Wean TPN off today  Thanks for allowing pharmacy to be a part of this patient's care.  Excell Seltzer, PharmD Clinical Pharmacist, [phone]

## 2014-10-11 NOTE — Progress Notes (Signed)
PULMONARY / CRITICAL CARE MEDICINE HISTORY AND PHYSICAL EXAMINATION   Name: [name] MRN: [id number] DOB: [birth year]    ADMISSION DATE:  09/26/2014  PRIMARY SERVICE: PCCM  CHIEF COMPLAINT:  Abd pain  BRIEF PATIENT DESCRIPTION: 23yof smoker with PMH of MS, depression presented 12/19 on transfer from [facility] with abd pain, found to have pneumoperitoneum 2/2 small bowel rupture, also with new cold L hand.  Required surgery for DU perf & left brachial bypass, fungemia noted on admission cultures.  SIGNIFICANT EVENTS / STUDIES:  09/26/14: Admitted, CT Abd with pneumoperitoneum, cold L hand concerning for arterial clot as well. 12/20 > ex-lap [name]) with omental patch for perforated prepyloric gastric ulcer; L brachial and ulnar artery embolectomy ([facility]) 12/20 LE doppler >>neg 12/20 >>Left brachial to left radial artery bypass with left leg greater saphenous vein for Recurrent occlusion  12/20 Self extubated - had to be reintubated 12/21 echo >>nml LVEF 12/26 CT abdomen >> pelvic fluid 12/28 TEE neg 2023/10/18 reintubated, coded 4 minutes; CT head without acute change 1/1 High dose pressors, CT abdomen> no fluid collection noted; LLL pneumonia 1/3 PSV 4 hours, Hydrocortisone added, following simple commands, pressors improved, oxygenation improved  LINES / TUBES: R IJ CVL 12/20 >> 12/29 LIJ CVL 12/28 >> JP drain RUQ 12/20 >>  ETT 12/19 >> 12/29, 12/31>>  SUBJECTIVE:  Slow improvement in pressors, oxygenation, tolerated SBT 4 hours this morning, periodic deoxygenation  VITAL SIGNS: Temp:  [96.4 F (35.8 C)-98.9 F (37.2 C)] 96.4 F (35.8 C) (01/03 0800) Pulse Rate:  [70-118] 82 (01/03 1145) Resp:  [10-73] 18 (01/03 1145) BP: (78-148)/(48-134) 98/72 mmHg (01/03 1145) SpO2:  [87 %-100 %] 92 % (01/03 1145) FiO2 (%):  [40 %-70 %] 40 % (01/03 1114) Weight:  [71.2 kg (156 lb 15.5 oz)] 71.2 kg (156 lb 15.5 oz) (01/03 0419) HEMODYNAMICS: CVP:  [10 mmHg-21 mmHg] 14  mmHg  Vent Mode:  [-] PRVC FiO2 (%):  [40 %-70 %] 40 % Set Rate:  [18 bmp] 18 bmp Vt Set:  [500 mL] 500 mL PEEP:  [5 cmH20] 5 cmH20 Pressure Support:  [10 cmH20] 10 cmH20 Plateau Pressure:  [12 cmH20-21 cmH20] 17 cmH20 INTAKE / OUTPUT: Intake/Output      01/02 0701 - 01/03 0700 01/03 0701 - 01/04 0700   I.V. (mL/kg) 2216 (31.1) 207.3 (2.9)   NG/GT 793.7 230   IV Piggyback 1250    TPN 1360 162.5   Total Intake(mL/kg) 5619.6 (78.9) 599.8 (8.4)   Urine (mL/kg/hr) 1095 (0.6) 315 (0.9)   Emesis/NG output 150 (0.1)    Drains 20 (0)    Total Output 1265 315   Net +4354.6 +284.8          PHYSICAL EXAMINATION:  Gen: awake on vent HEENT: NCAT, ETT in place PULM: rhonchi left base, otherwise clear CV:  RRR, no mgr AB: nontender Derm: mottled, cool fingers R hand Neuro: opens eyes to voice, tracks with eyes, follows simple commands ("open eyes wide, close them tight")  LABS: I have reviewed all of today's lab results. Relevant abnormalities are discussed in the A/P section  CXR 1/2 > ETT and CVL in place, atelectasis L > R base  ASSESSMENT / PLAN:  Principal Problem:   Pneumoperitoneum Active Problems:   Sepsis   Altered mental state   Brachial artery occlusion   Acute respiratory failure with hypoxia   Severe sepsis   Perforated gastric ulcer   Acute respiratory failure   Artery occlusion   Blood  poisoning   Candidemia   Screen for STD (sexually transmitted disease)   Abdominal abscess   Septic thrombophlebitis   Elevated WBCs   Fever   Limb ischemia   HCAP (healthcare-associated pneumonia)   Septic shock   Demand ischemia   PULMONARY A: ETT 12/19 >>12/29, 12/31 >>  Acute respiratory failure with hypoxemia > due to MRSA HCAP Difficult airway 12/31 due to cord edema P:   Plan tracheostomy this week, would contact ENT 1/4 (they saw her briefly 12/31) Continue full vent support SBT as tolerated Wean FiO2 for SpO2 > 92%  CARDIOVASCULAR A: Septic Shock  1/1 > improving Demand ischemia 1/1 Brachial artery occlusion, s/p bypass, TEE neg P:   Hold labetalol Monitor CVP Keep K > 4, Mg > 2 Stop vasopressin Wean Levophed Wean hydrocortisone F/U vascular recs Continue full dose lovenox at this point, long term would transition to DOAC  RENAL A: AKI, resolved P:   Monitor BMET intermittently Monitor I/Os Correct electrolytes as indicated   GASTROINTESTINAL A: Perforated gastric ulcer and pneumoperitoneum  Post laparotomy P:   Post op care per surgery Cont TPN, TFs per surgery Cont SUP  HEMATOLOGIC A: Arterial clot as noted above Anemia without clear evidence of bleeding P:   Lovenox Rx doses Monitor CBC intermittently Transfuse per usual ICU guidelines  INFECTIOUS A: Peritonitis after bowel perforation 12/20 > Candida glabrata peritonitis and fungemia Septic shock 1/1 > due to MRSA HCAP, procalcitonin 38.5 P:   Blood cx: 09/26/14-->  NGTD, 12/24 >> ng 12/20 peritoneal fluid >> few yeast  12/31 blood > coag neg staph 12/31 resp >  MRSA  Vanc: 12/19 >> 12/25 Zosyn:  12/19 >> 12/25 Micafungin 12/23 >> plan 6 weeks per ID Ceftriaxone 12/31 >1/1 Flagyl 12/31 > 1/3 Vanc 1/1 >  Meropenem 1/1 > 1/3  ENDOCRINE A:  Hypoglycemia, resolved Intermittent hyperglycemia P:   Cont SSI -resistant Add lantus 10 U 1/3 Wean steroids   NEUROLOGIC A: Post op pain Acute encephalopathy 1/1 > worrisome for anoxic injury but making slow improvement 1/3 History of Guillian Barre syndrome P:   RASS goal -2 Fentanyl gtt, versed prn F/U EEG, may need neurology consult   Social / Family: Family updated at bedside 1/2  TODAY'S SUMMARY: Prolonged hospitalization from perforated duodenal ulcer with candida glabrata peritonitis and fungemia, also arterial occlusion L brachial requiring revascularization; developed MRSA pneumonia on 12/31 with emergent intubation, septic shock. 1/3 showing improvement, appears encephalopathic but  improving.  The patient is critically ill with multiple organ systems failure and requires high complexity decision making for assessment and support, frequent evaluation and titration of therapies, application of advanced monitoring technologies and extensive interpretation of multiple databases. Critical Care Time devoted to patient care services described in this note independent of APP time is 40 minutes.    Roselie Awkward, MD [place] [facility] Pager: [phone] Cell: [phone] If no response, call [phone]

## 2014-10-11 NOTE — Progress Notes (Signed)
CRITICAL VALUE ALERT  Critical value received:  Tracheal Aspirate Culture positive for MRSA  Date of notification:  10/11/2014  Time of notification:  2751  Critical value read back:Yes.    Nurse who received alert:  Lilyan Gilford, RN  Responding MD:  Dr. Lake Bells at bedside  Time MD responded:  7001

## 2014-10-12 ENCOUNTER — Inpatient Hospital Stay (HOSPITAL_COMMUNITY): Payer: BLUE CROSS/BLUE SHIELD

## 2014-10-12 DIAGNOSIS — J151 Pneumonia due to Pseudomonas: Secondary | ICD-10-CM | POA: Insufficient documentation

## 2014-10-12 DIAGNOSIS — K559 Vascular disorder of intestine, unspecified: Secondary | ICD-10-CM | POA: Insufficient documentation

## 2014-10-12 LAB — CBC WITH DIFFERENTIAL/PLATELET
Basophils Absolute: 0 10*3/uL (ref 0.0–0.1)
Basophils Relative: 0 % (ref 0–1)
EOS PCT: 0 % (ref 0–5)
Eosinophils Absolute: 0 10*3/uL (ref 0.0–0.7)
HCT: 26.5 % — ABNORMAL LOW (ref 36.0–46.0)
Hemoglobin: 8 g/dL — ABNORMAL LOW (ref 12.0–15.0)
LYMPHS ABS: 2.1 10*3/uL (ref 0.7–4.0)
LYMPHS PCT: 11 % — AB (ref 12–46)
MCH: 30.9 pg (ref 26.0–34.0)
MCHC: 30.2 g/dL (ref 30.0–36.0)
MCV: 102.3 fL — ABNORMAL HIGH (ref 78.0–100.0)
Monocytes Absolute: 0.8 10*3/uL (ref 0.1–1.0)
Monocytes Relative: 4 % (ref 3–12)
NEUTROS PCT: 85 % — AB (ref 43–77)
Neutro Abs: 17 10*3/uL — ABNORMAL HIGH (ref 1.7–7.7)
Platelets: 486 10*3/uL — ABNORMAL HIGH (ref 150–400)
RBC: 2.59 MIL/uL — AB (ref 3.87–5.11)
RDW: 14.6 % (ref 11.5–15.5)
WBC: 20 10*3/uL — AB (ref 4.0–10.5)

## 2014-10-12 LAB — GLUCOSE, CAPILLARY
GLUCOSE-CAPILLARY: 149 mg/dL — AB (ref 70–99)
GLUCOSE-CAPILLARY: 110 mg/dL — AB (ref 70–99)
Glucose-Capillary: 201 mg/dL — ABNORMAL HIGH (ref 70–99)
Glucose-Capillary: 225 mg/dL — ABNORMAL HIGH (ref 70–99)
Glucose-Capillary: 130 mg/dL — ABNORMAL HIGH (ref 70–99)

## 2014-10-12 LAB — VANCOMYCIN, TROUGH: Vancomycin Tr: 22.8 ug/mL — ABNORMAL HIGH (ref 10.0–20.0)

## 2014-10-12 LAB — BASIC METABOLIC PANEL
ANION GAP: 4 — AB (ref 5–15)
BUN: 18 mg/dL (ref 6–23)
CHLORIDE: 111 meq/L (ref 96–112)
CO2: 29 mmol/L (ref 19–32)
Calcium: 7.9 mg/dL — ABNORMAL LOW (ref 8.4–10.5)
Creatinine, Ser: 0.74 mg/dL (ref 0.50–1.10)
GFR calc non Af Amer: >90 mL/min (ref >90)
Glucose, Bld: 221 mg/dL — ABNORMAL HIGH (ref 70–99)
POTASSIUM: 3.9 mmol/L (ref 3.5–5.1)
SODIUM: 144 mmol/L (ref 135–145)

## 2014-10-12 LAB — MAGNESIUM: MAGNESIUM: 2.1 mg/dL (ref 1.5–2.5)

## 2014-10-12 MED ORDER — ASPIRIN 325 MG PO TABS
325.0000 mg | ORAL_TABLET | Freq: Every day | ORAL | Status: DC
  Administered 2014-10-13 – 2014-10-18 (×5): 325 mg
  Filled 2014-10-12 (×6): qty 1

## 2014-10-12 MED ORDER — DEXTROSE 5 % IV SOLN
1.0000 g | Freq: Three times a day (TID) | INTRAVENOUS | Status: DC
  Administered 2014-10-12 – 2014-10-18 (×19): 1 g via INTRAVENOUS
  Filled 2014-10-12 (×21): qty 1

## 2014-10-12 MED ORDER — ACETAMINOPHEN 160 MG/5ML PO SOLN
650.0000 mg | Freq: Four times a day (QID) | ORAL | Status: DC | PRN
  Administered 2014-10-14 – 2014-10-16 (×2): 650 mg
  Filled 2014-10-12 (×2): qty 20.3

## 2014-10-12 MED ORDER — VANCOMYCIN HCL IN DEXTROSE 750-5 MG/150ML-% IV SOLN
750.0000 mg | Freq: Two times a day (BID) | INTRAVENOUS | Status: DC
  Administered 2014-10-12 – 2014-10-18 (×12): 750 mg via INTRAVENOUS
  Filled 2014-10-12 (×14): qty 150

## 2014-10-12 MED ORDER — MIDAZOLAM HCL 2 MG/2ML IJ SOLN
1.0000 mg | INTRAMUSCULAR | Status: DC | PRN
  Administered 2014-10-13: 2 mg via INTRAVENOUS

## 2014-10-12 NOTE — Progress Notes (Addendum)
[place] for Infectious Disease    Subjective: NA   Antibiotics:  Anti-infectives    Start     Dose/Rate Route Frequency Ordered Stop   10/12/14 1400  cefTAZidime (FORTAZ) 1 g in dextrose 5 % 50 mL IVPB     1 g100 mL/hr over 30 Minutes Intravenous 3 times per day 10/12/14 1058     10/09/14 1400  vancomycin (VANCOCIN) IVPB 1000 mg/200 mL premix     1,000 mg200 mL/hr over 60 Minutes Intravenous Every 12 hours 10/09/14 1303     10/09/14 1400  meropenem (MERREM) 1 g in sodium chloride 0.9 % 100 mL IVPB  Status:  Discontinued     1 g200 mL/hr over 30 Minutes Intravenous 3 times per day 10/09/14 1303 10/11/14 1211   10/09/14 1200  cefTRIAXone (ROCEPHIN) 1 g in dextrose 5 % 50 mL IVPB - Premix  Status:  Discontinued     1 g100 mL/hr over 30 Minutes Intravenous Every 24 hours 10/08/14 1519 10/09/14 1255   10/08/14 2200  metroNIDAZOLE (FLAGYL) IVPB 500 mg  Status:  Discontinued     500 mg100 mL/hr over 60 Minutes Intravenous 3 times per day 10/08/14 1520 10/11/14 1211   10/08/14 1400  metroNIDAZOLE (FLAGYL) IVPB 500 mg  Status:  Discontinued     500 mg100 mL/hr over 60 Minutes Intravenous 3 times per day 10/08/14 0945 10/08/14 1516   10/08/14 1000  cefTRIAXone (ROCEPHIN) 1 g in dextrose 5 % 50 mL IVPB - Premix  Status:  Discontinued     1 g100 mL/hr over 30 Minutes Intravenous Every 24 hours 10/08/14 0945 10/08/14 1516   09/30/14 1300  micafungin (MYCAMINE) 100 mg in sodium chloride 0.9 % 100 mL IVPB    Comments:  Pharmacy may adjust as needed   100 mg100 mL/hr over 1 Hours Intravenous Every 24 hours 09/30/14 1213     09/27/14 2100  ceFEPIme (MAXIPIME) 2 g in dextrose 5 % 50 mL IVPB  Status:  Discontinued     2 g100 mL/hr over 30 Minutes Intravenous Every 24 hours 09/27/14 0603 09/27/14 1103   09/27/14 2000  vancomycin (VANCOCIN) IVPB 1000 mg/200 mL premix  Status:  Discontinued     1,000 mg200 mL/hr over 60 Minutes Intravenous Every 24 hours 09/27/14 0603 09/27/14 1103   09/27/14  1400  vancomycin (VANCOCIN) IVPB 1000 mg/200 mL premix  Status:  Discontinued     1,000 mg200 mL/hr over 60 Minutes Intravenous Every 12 hours 09/27/14 1103 10/02/14 1348   09/27/14 1200  ceFEPIme (MAXIPIME) 2 g in dextrose 5 % 50 mL IVPB  Status:  Discontinued     2 g100 mL/hr over 30 Minutes Intravenous Every 12 hours 09/27/14 1103 10/02/14 1348   09/26/14 1930  vancomycin (VANCOCIN) IVPB 1000 mg/200 mL premix     1,000 mg200 mL/hr over 60 Minutes Intravenous  Once 09/26/14 1917 09/26/14 2210   09/26/14 1930  ceFEPIme (MAXIPIME) 2 g in dextrose 5 % 50 mL IVPB     2 g100 mL/hr over 30 Minutes Intravenous  Once 09/26/14 1917 09/26/14 2132      Medications: Scheduled Meds: . antiseptic oral rinse  7 mL Mouth Rinse QID  . [START ON 10/13/2014] aspirin  325 mg Per Tube Daily  . cefTAZidime (FORTAZ)  IV  1 g Intravenous 3 times per day  . chlorhexidine  15 mL Mouth Rinse BID  . enoxaparin (LOVENOX) injection  70 mg Subcutaneous Q12H  . hydrocortisone sodium succinate  50 mg Intravenous Q12H  . insulin aspart  0-9 Units Subcutaneous 6 times per day  . insulin glargine  10 Units Subcutaneous Daily  . micafungin [facility]) IV  100 mg Intravenous Q24H  . pantoprazole sodium  40 mg Per Tube Q1200  . sodium chloride  10-40 mL Intracatheter Q12H  . vancomycin  1,000 mg Intravenous Q12H   Continuous Infusions: . sodium chloride 20 mL/hr at 10/11/14 1900  . feeding supplement (VITAL AF 1.2 CAL) 1,000 mL (10/11/14 1900)  . fentaNYL infusion INTRAVENOUS 400 mcg/hr (10/12/14 1400)  . midazolam (VERSED) infusion 2 mg/hr (10/12/14 0900)   PRN Meds:.acetaminophen (TYLENOL) oral liquid 160 mg/5 mL, docusate **OR** docusate, fentaNYL, fentaNYL, Gerhardt's butt cream, ipratropium-albuterol, labetalol, midazolam, ondansetron (ZOFRAN) IV, sodium chloride    Objective: Weight change: 1 lb 12.2 oz (0.8 kg)  Intake/Output Summary (Last 24 hours) at 10/12/14 1503 Last data filed at 10/12/14 1400  Gross  per 24 hour  Intake 2943.7 ml  Output    825 ml  Net 2118.7 ml   Blood pressure 96/54, pulse 110, temperature 99 F (37.2 C), temperature source Axillary, resp. rate 19, height 4\' 9"  (1.448 m), weight 158 lb 11.7 oz (72 kg), SpO2 97 %. Temp:  [98.9 F (37.2 C)-99.7 F (37.6 C)] 99 F (37.2 C) (01/04 0844) Pulse Rate:  [77-117] 110 (01/04 1400) Resp:  [12-22] 19 (01/04 1400) BP: (80-118)/(43-77) 96/54 mmHg (01/04 1400) SpO2:  [88 %-100 %] 97 % (01/04 1400) FiO2 (%):  [40 %] 40 % (01/04 1200) Weight:  [158 lb 11.7 oz (72 kg)] 158 lb 11.7 oz (72 kg) (01/04 0500)  Physical Exam: General: intubated sedated. HEENT: EOMI CVS tachycardic normal r, no murmur rubs or gallops Chest: +rhonchi Abdomen: soft distended, positive bowel sounds dressing in place Ext: hands with gloves on  Neuro: nonfocal  CBC:  CBC Latest Ref Rng 10/12/2014 10/11/2014 10/10/2014  WBC 4.0 - 10.5 K/uL 20.0(H) 21.1(H) 30.3(H)  Hemoglobin 12.0 - 15.0 g/dL 8.0(L) 8.3(L) 9.8(L)  Hematocrit 36.0 - 46.0 % 26.5(L) 27.5(L) 31.8(L)  Platelets 150 - 400 K/uL 486(H) 453(H) 540(H)      BMET  Recent Labs  10/11/14 0400 10/12/14 0415  NA 137 144  K 4.3 3.9  CL 108 111  CO2 27 29  GLUCOSE 315* 221*  BUN 18 18  CREATININE 0.75 0.74  CALCIUM 7.6* 7.9*     Liver Panel   Recent Labs  10/11/14 0400  PROT 6.8  ALBUMIN 2.0*  AST 294*  ALT 226*  ALKPHOS 128*  BILITOT 0.7       Sedimentation Rate No results for input(s): ESRSEDRATE in the last 72 hours. C-Reactive Protein No results for input(s): CRP in the last 72 hours.  Micro Results: Recent Results (from the past 720 hour(s))  Urine culture     Status: None   Collection Time: 09/26/14  7:22 PM  Result Value Ref Range Status   Specimen Description URINE, CATHETERIZED  Final   Special Requests NONE  Final   Culture  Setup Time   Final    09/28/2014 00:50 Performed at [place] Performed at [facility]   Final   Culture NO GROWTH Performed at [facility]   Final   Report Status 09/29/2014 FINAL  Final  Blood culture (routine x 2)     Status: None   Collection Time: 09/26/14  8:56 PM  Result Value Ref Range Status  Specimen Description BLOOD LEFT ANTECUBITAL DRAWN BY RN  Final   Special Requests BOTTLES DRAWN AEROBIC ONLY 4CC  Final   Culture  Setup Time   Final    09/30/2014 20:55 Performed at [facility]    Culture   Final    CANDIDA GLABRATA Note: Gram Stain Report Called to,Read Back By and Verified With: WILKINSON R([facility])@1140  ON 09/30/14 BY Elza Rafter. Performed at [facility] Performed at [facility]    Report Status 10/03/2014 FINAL  Final  Blood culture (routine x 2)     Status: None   Collection Time: 09/26/14  9:00 PM  Result Value Ref Range Status   Specimen Description BLOOD LEFT ANTECUBITAL DRAWN BY RN  Final   Special Requests BOTTLES DRAWN AEROBIC ONLY 4CC  Final   Culture   Final    CANDIDA GLABRATA Note: Gram Stain Report Called to,Read Back By and Verified With: DICKENS B AT [facility] AT 8546 ON [id number] BY FORSYTH K Performed at [facility]    Report Status 10/04/2014 FINAL  Final  MRSA PCR Screening     Status: None   Collection Time: 09/26/14 11:30 PM  Result Value Ref Range Status   MRSA by PCR NEGATIVE NEGATIVE Final    Comment:        The GeneXpert MRSA Assay (FDA approved for NASAL specimens only), is one component of a comprehensive MRSA colonization surveillance program. It is not intended to diagnose MRSA infection nor to guide or monitor treatment for MRSA infections.   Body fluid culture     Status: None   Collection Time: 09/27/14  1:59 AM  Result Value Ref Range Status   Specimen Description FLUID PERITONEAL  Final   Special Requests ON SWAB  Final   Gram Stain   Final    RARE WBC PRESENT, PREDOMINANTLY PMN NO ORGANISMS SEEN Performed at [facility]    Culture   Final     FEW CANDIDA GLABRATA Note: CRITICAL RESULT CALLED TO, READ BACK BY AND VERIFIED WITH: BEVON BY INGRAMA 12/23 12N Performed at [facility]    Report Status 10/03/2014 FINAL  Final  Anaerobic culture     Status: None   Collection Time: 09/27/14  1:59 AM  Result Value Ref Range Status   Specimen Description FLUID PERITONEAL  Final   Special Requests NONE  Final   Gram Stain   Final    FEW WBC PRESENT, PREDOMINANTLY PMN NO SQUAMOUS EPITHELIAL CELLS SEEN NO ORGANISMS SEEN Performed at [facility]    Culture   Final    NO ANAEROBES ISOLATED Performed at [facility]    Report Status 10/02/2014 FINAL  Final  Culture, blood (routine x 2)     Status: None   Collection Time: 10/01/14  9:24 PM  Result Value Ref Range Status   Specimen Description BLOOD LEFT HAND  Final   Special Requests BOTTLES DRAWN AEROBIC ONLY 10CC  Final   Culture   Final    NO GROWTH 5 DAYS Note: Culture results may be compromised due to an excessive volume of blood received in culture bottles. Performed at [facility]    Report Status 10/08/2014 FINAL  Final  Culture, blood (routine x 2)     Status: None   Collection Time: 10/01/14  9:35 PM  Result Value Ref Range Status   Specimen Description BLOOD RIGHT HAND  Final   Special Requests BOTTLES DRAWN AEROBIC ONLY 10CC  Final   Culture   Final    NO GROWTH 5 DAYS Note: Culture results may be compromised due to an excessive volume of blood received in culture bottles. Performed at [facility]    Report Status 10/08/2014 FINAL  Final  Urine culture     Status: None   Collection Time: 10/06/14 12:02 PM  Result Value Ref Range Status   Specimen Description URINE, CATHETERIZED  Final   Special Requests NONE  Final   Colony Count NO GROWTH Performed at [facility]   Final   Culture NO GROWTH Performed at [facility]   Final   Report Status 10/07/2014 FINAL  Final  Clostridium Difficile by PCR      Status: None   Collection Time: 10/07/14  8:33 PM  Result Value Ref Range Status   C difficile by pcr NEGATIVE NEGATIVE Final  Culture, blood (routine x 2)     Status: None   Collection Time: 10/08/14 10:54 AM  Result Value Ref Range Status   Specimen Description BLOOD RIGHT HAND  Final   Special Requests BOTTLES DRAWN AEROBIC ONLY 3CC  Final   Culture   Final    STAPHYLOCOCCUS SPECIES (COAGULASE NEGATIVE) Note: THE SIGNIFICANCE OF ISOLATING THIS ORGANISM FROM A SINGLE SET OF BLOOD CULTURES WHEN MULTIPLE SETS ARE DRAWN IS UNCERTAIN. PLEASE NOTIFY THE MICROBIOLOGY DEPARTMENT WITHIN ONE WEEK IF SPECIATION AND SENSITIVITIES ARE REQUIRED. Note: Gram Stain Report Called to,Read Back By and Verified With: REBECCA SARINE ON 1.1.2016 AT 10:25P BY WILEJ Performed at [facility]    Report Status 10/11/2014 FINAL  Final  Culture, blood (routine x 2)     Status: None (Preliminary result)   Collection Time: 10/08/14 11:04 AM  Result Value Ref Range Status   Specimen Description BLOOD RIGHT HAND  Final   Special Requests BOTTLES DRAWN AEROBIC ONLY 3CC  Final   Culture   Final           BLOOD CULTURE RECEIVED NO GROWTH TO DATE CULTURE WILL BE HELD FOR 5 DAYS BEFORE ISSUING A FINAL NEGATIVE REPORT Performed at [facility]    Report Status PENDING  Incomplete  Culture, respiratory (NON-Expectorated)     Status: None   Collection Time: 10/08/14 11:53 AM  Result Value Ref Range Status   Specimen Description TRACHEAL ASPIRATE  Final   Special Requests NONE  Final   Gram Stain   Final    FEW WBC PRESENT,BOTH PMN AND MONONUCLEAR NO SQUAMOUS EPITHELIAL CELLS SEEN ABUNDANT GRAM POSITIVE COCCI IN PAIRS IN CLUSTERS RARE GRAM NEGATIVE RODS Performed at [facility]    Culture   Final    MODERATE PSEUDOMONAS AERUGINOSA ABUNDANT METHICILLIN RESISTANT STAPHYLOCOCCUS AUREUS Note: RIFAMPIN AND GENTAMICIN SHOULD NOT BE USED AS SINGLE DRUGS FOR TREATMENT OF STAPH INFECTIONS. CRITICAL  RESULT CALLED TO, READ BACK BY AND VERIFIED WITH: [facility] RN [id number] 10/11/13 GUSTK Performed at [facility]    Report Status 10/11/2014 FINAL  Final   Organism ID, Bacteria PSEUDOMONAS AERUGINOSA  Final   Organism ID, Bacteria METHICILLIN RESISTANT STAPHYLOCOCCUS AUREUS  Final      Susceptibility   Pseudomonas aeruginosa - MIC*    CEFEPIME 2 SENSITIVE Sensitive     CEFTAZIDIME 2 SENSITIVE Sensitive     CIPROFLOXACIN <=0.25 SENSITIVE Sensitive     GENTAMICIN <=1 SENSITIVE Sensitive     IMIPENEM 1 SENSITIVE Sensitive     PIP/TAZO 8 SENSITIVE Sensitive     TOBRAMYCIN <=1 SENSITIVE  Sensitive     * MODERATE PSEUDOMONAS AERUGINOSA   Methicillin resistant staphylococcus aureus - MIC*    CLINDAMYCIN >=8 RESISTANT Resistant     ERYTHROMYCIN >=8 RESISTANT Resistant     GENTAMICIN <=0.5 SENSITIVE Sensitive     LEVOFLOXACIN >=8 RESISTANT Resistant     OXACILLIN >=4 RESISTANT Resistant     PENICILLIN >=0.5 RESISTANT Resistant     RIFAMPIN <=0.5 SENSITIVE Sensitive     TRIMETH/SULFA <=10 SENSITIVE Sensitive     VANCOMYCIN 1 SENSITIVE Sensitive     TETRACYCLINE <=1 SENSITIVE Sensitive     * ABUNDANT METHICILLIN RESISTANT STAPHYLOCOCCUS AUREUS    Studies/Results: Dg Chest Port 1 View  10/12/2014   CLINICAL DATA:  Followup acute respiratory failure  EXAM: PORTABLE CHEST - 1 VIEW  COMPARISON:  10/11/2014  FINDINGS: The endotracheal tube, nasogastric catheter and left jugular central venous line are again seen and stable. Bibasilar airspace disease likely representing atelectasis is again identified. No new focal confluent infiltrate is seen. No pneumothorax or sizable effusion is noted. No bony abnormality is seen.  IMPRESSION: Stable appearance of the chest from the previous day.   Electronically Signed   By: Inez Catalina M.D.   On: 10/12/2014 07:56   Dg Chest Port 1 View  10/11/2014   CLINICAL DATA:  Acute respiratory failure with hypoxia.  EXAM: PORTABLE CHEST - 1 VIEW  COMPARISON:  1 day  prior  FINDINGS: Support apparatus: Endotracheal tube 2.7 cm above carina. Left internal jugular line tip mid SVC. Nasogastric tube terminates at the distal stomach. Numerous leads and wires project over the chest. Lower cervical spine fixation.  Cardiomediastinal silhouette: Normal heart size.  Pleura: No right and no definite left pleural effusion. No pneumothorax.  Lungs: Low lung volumes. Similar left and developing right base airspace disease.  Other: Contrast within left upper abdominal bowel loops.  IMPRESSION: Similar left and developing right base airspace disease. Favor atelectasis.  Low lung volumes.   Electronically Signed   By: Abigail Miyamoto M.D.   On: 10/11/2014 07:51      Assessment/Plan:  Principal Problem:   Pneumoperitoneum Active Problems:   Sepsis   Altered mental state   Brachial artery occlusion   Acute respiratory failure with hypoxia   Severe sepsis   Perforated gastric ulcer   Acute respiratory failure   Artery occlusion   Blood poisoning   Candidemia   Screen for STD (sexually transmitted disease)   Abdominal abscess   Septic thrombophlebitis   Elevated WBCs   Fever   Limb ischemia   HCAP (healthcare-associated pneumonia)   Septic shock   Demand ischemia   MRSA pneumonia   Candida glabrata infection    [name] is a 49 y.o. female with  Perforated gastric ulcer C.glabrata fungemia with possible septic thrombophlebitis/radial artery thrombus, with pseudomonas and MRSA growing from tracheal aspirate from 10/08/14.  Worsening airspace disease now on vancomycin, ceftazidime and micafungin.   #1 HCAP: --give total of 14 days anti - pseudomonal therapy and 14 days anti-MRSA therapy then can stop both  #2 Candida glabrata fungemia and possible septic  Thrombophlebitis    --continue micafungin for 6 weeks with day 1 being 10/01/2014 --she still DOES NEED a dedicated  Dilated funduscopic exam performed by an ophthalmologist to ensure no fungal  endophthalmitis  #3 Intrabdominal abscess: we are giving her two weeks of anti MRSA and anti pseudomonal gram negative rod coverage in addition to her antifungal. Should she have any clinical worsening would  reimage her abdomen and if evidence of  Increase in her intra-abdominal fluid collection or suggestion of abscess would ask interventional radiology to drain this and sent for culture.   I will arrange for hospital followup with [place] in ID clinic assuming she survies this hospitalization  I will otherwise sign off.  Please call with further questions.    LOS: 16 days   Alcide Evener 10/12/2014, 3:03 PM

## 2014-10-12 NOTE — Progress Notes (Addendum)
Progress Note    10/12/2014 7:53 AM 15 Days Post-Op  Subjective:  intubated  Tm 99.7 now 99.3  Filed Vitals:   10/12/14 0700  BP: 93/59  Pulse: 94  Temp:   Resp: 18    Physical Exam: Incisions:  Well healed Extremities:  2+ left radial pulse; biphasic right radial, ulnar and palmer arch.   CBC    Component Value Date/Time   WBC 20.0* 10/12/2014 0415   RBC 2.59* 10/12/2014 0415   HGB 8.0* 10/12/2014 0415   HCT 26.5* 10/12/2014 0415   PLT 486* 10/12/2014 0415   MCV 102.3* 10/12/2014 0415   MCH 30.9 10/12/2014 0415   MCHC 30.2 10/12/2014 0415   RDW 14.6 10/12/2014 0415   LYMPHSABS 2.1 10/12/2014 0415   MONOABS 0.8 10/12/2014 0415   EOSABS 0.0 10/12/2014 0415   BASOSABS 0.0 10/12/2014 0415    BMET    Component Value Date/Time   NA 144 10/12/2014 0415   K 3.9 10/12/2014 0415   CL 111 10/12/2014 0415   CO2 29 10/12/2014 0415   GLUCOSE 221* 10/12/2014 0415   BUN 18 10/12/2014 0415   CREATININE 0.74 10/12/2014 0415   CREATININE 0.80 12/08/2013 1313   CALCIUM 7.9* 10/12/2014 0415   GFRNONAA >90 10/12/2014 0415   GFRAA >90 10/12/2014 0415    INR    Component Value Date/Time   INR 1.25 10/05/2014 0732     Intake/Output Summary (Last 24 hours) at 10/12/14 0753 Last data filed at 10/12/14 0700  Gross per 24 hour  Intake 3710.55 ml  Output    995 ml  Net 2715.55 ml   TEE 10/05/14: Study Conclusions  - Left ventricle: The cavity size was normal. Wall thickness was normal. Systolic function was vigorous. The estimated ejection fraction was in the range of 65% to 70%. Wall motion was normal; there were no regional wall motion abnormalities. - Aortic valve: No evidence of vegetation. - Mitral valve: No evidence of vegetation. There was mild regurgitation. - Left atrium: No evidence of thrombus in the appendage. - Right atrium: No evidence of thrombus in the atrial cavity or appendage. - Atrial septum: No defect or patent foramen ovale was  identified. Echo contrast study showed no right-to-left atrial level shunt, following an increase in RA pressure induced by provocative maneuvers. - Tricuspid valve: No evidence of vegetation. There was trivial regurgitation. - Pulmonic valve: No evidence of vegetation. There was trivial regurgitation.  Impressions:  - No cardiac source of emboli was indentified.  Assessment:  49 y.o. female is s/p:  #1: Thrombectomy of left brachial, radial, and ulnar arteries #2: Patch angioplasty of left brachial and ulnar artery #3: Patch angioplasty left radial artery And take back to OR later that day for:                         1. Redo Left brachial, ulnar, and radial exposure 2. Thrombectomy Left brachial, ulnar, adn radial artery 3. Angiogram, left upper extremity 4. Left brachial to left radial artery bypass with left leg                    non-reversed greater saphenous vein 5. Intra-arterial injection of TPA 15 Days Post-Op  Plan: -pt with a 2+ left radial pulse that is palpable -fingertips to left hand are dusky with the 4th finger being the worse now with a blister.  She has been on Levophed, but this is now off.  Her  fingers on the right may be in part due to pressor support. -DVT prophylaxis:  Lovenox -TEE on 10/05/14 revealed no evidence of cardiac emboli -continue support -ex lap per general surgery   Tammy Locket, PA-C [facility] [phone] 10/12/2014 7:53 AM    I agree with the above  Annamarie Major

## 2014-10-12 NOTE — Progress Notes (Addendum)
PHARMACY CONSULT NOTE - FOLLOW UP  Pharmacy Consult for Ceftaz/Vancomycin; Lovenox Indication: MRSA and PSA PNA; chronic brachial artery thrombus  Allergies  Allergen Reactions  . Ambien [Zolpidem Tartrate] Other (See Comments)    Crazy dreams  . Ceftin [Cefuroxime Axetil] Nausea And Vomiting    Pt tolerated Cefepime 09/2014 admission  . Hctz [Hydrochlorothiazide] Other (See Comments)    Urinary retention  . Hydrocodone Nausea Only  . Lodine [Etodolac] Hives  . Promethazine Other (See Comments)    Hallucinations.   . Roxicodone [Oxycodone Hcl] Swelling  . Codeine Rash  . Latex Rash  . Penicillins Rash    Patient Measurements: Height: 4\' 9"  (144.8 cm) Weight: 158 lb 11.7 oz (72 kg) IBW/kg (Calculated) : 38.6  Vital Signs: Temp: 99 F (37.2 C) (01/04 0844) Temp Source: Axillary (01/04 0844) BP: 111/58 mmHg (01/04 1000) Pulse Rate: 104 (01/04 1000) Intake/Output from previous day: 01/03 0701 - 01/04 0700 In: 3710.6 [I.V.:1553.1; NG/GT:1320; IV Piggyback:500; TPN:337.5] Out: 995 [Urine:995] Intake/Output from this shift: Total I/O In: 305 [I.V.:155; NG/GT:150] Out: 150 [Urine:150]  Labs:  Recent Labs  10/10/14 0415 10/10/14 1708 10/11/14 0400 10/12/14 0415  WBC 30.3*  --  21.1* 20.0*  HGB 9.8*  --  8.3* 8.0*  PLT 540*  --  453* 486*  CREATININE 1.07 0.84 0.75 0.74   Estimated Creatinine Clearance: 69.8 mL/min (by C-G formula based on Cr of 0.74).    Microbiology: 12/19 Urine -NEG 12/19 Blood - 2/2 candida glabrata 12/20 Peritoneal fluid - candida glabrata (usually azole resistant) 12/24 BCx2>>NEG 12/29 Urine>>NEG 12/30 Cdiff PCR>>NEG 12/31 BCx2>> 1/2 CNS 12/31 TA abundant MRSA+, moderate PSA  Vanc 12/19>>12/25; 1/1>>     12/22 VT = 17.5 Cefepime 12/19>>12/25 Mica 12/23>> Rocephin 12/31>>1/1 Flagyl 12/31>>1/3 Meropenem 1/1>>1/3 Ceftaz 1/4 >>  Assessment: 49 yo F continues on IV antibiotics for candidemia, now with MRSA and pseudomonas PNA.   WBC remains elevated but trending down.  Renal function is stable.  Today is day#4 of IV Vancomycin, will check Vancomycin trough with next dose.  Was on Merrem, to change to [place] for pseudomonas coverage.  Goal of Therapy:  Vancomycin trough level 15-20 mcg/ml Renal dose adjustment of antibiotics  Plan:  Continue Vancomycin 1gm IV q12h - Vancomycin trough ordered for 1330 today. Ceftaz 1gm IV q8h Follow up renal function, culture data, and anticipated length of therapy.  [employer], Pharm.D., BCPS Clinical Pharmacist Pager [phone] 10/12/2014 10:56 AM    Addendum:  Vancomycin trough level = 22.8 on 1gm IV q12h Slightly above goal level of 15-20.  Will adjust dose to 750mg  IV q12h.  Will delay next dose until 6pm to allow for some drug clearance.  [employer], Pharm.D., BCPS Clinical Pharmacist Pager [phone] 10/12/2014 3:10 PM

## 2014-10-12 NOTE — Progress Notes (Signed)
PULMONARY / CRITICAL CARE MEDICINE HISTORY AND PHYSICAL EXAMINATION   Name: [name] MRN: [id number] DOB: 07/08/1966    ADMISSION DATE:  09/26/2014  PRIMARY SERVICE: PCCM  CHIEF COMPLAINT:  Abd pain  BRIEF PATIENT DESCRIPTION:  67yof smoker with PMH of MS, depression presented 12/19 on transfer from [facility] with abd pain, found to have pneumoperitoneum 2/2 small bowel rupture, also with new cold L hand.  Required surgery for DU perf & left brachial bypass, fungemia noted on admission cultures.  SIGNIFICANT EVENTS / STUDIES:  12/19 Admitted, CT Abd with pneumoperitoneum, cold L hand concerning for arterial clot as well. 12/20 ex-lap [name]) with omental patch for perforated prepyloric gastric ulcer; L brachial and ulnar artery embolectomy ([place]) 12/20 LE doppler >>neg 12/20 Left brachial to left radial artery bypass with left leg greater saphenous vein for recurrent occlusion  12/20 Self extubated - had to be reintubated 12/21 echo >>nml LVEF 12/26 CT abdomen >> pelvic fluid 12/28 TEE neg 10/30/2023 reintubated, coded 4 minutes; CT head without acute change 01/01 High dose pressors, CT abdomen> no fluid collection noted; LLL pneumonia 01/03 PSV 4 hours, Hydrocortisone added, following simple commands, pressors improved, oxygenation improved 01/04 off pressors  LINES / TUBES: R IJ CVL 12/20 >> 12/29 LIJ CVL 12/28 >> JP drain RUQ 12/20 >>  ETT 12/19 >> 12/29, 12/31>>  SUBJECTIVE:  Tolerated some pressure support weaning this AM.  VITAL SIGNS: Temp:  [98.5 F (36.9 C)-99.7 F (37.6 C)] 99 F (37.2 C) (01/04 0844) Pulse Rate:  [77-117] 92 (01/04 0811) Resp:  [10-22] 18 (01/04 0811) BP: (80-118)/(43-77) 107/57 mmHg (01/04 0811) SpO2:  [87 %-100 %] 92 % (01/04 0811) FiO2 (%):  [40 %] 40 % (01/04 0811) Weight:  [158 lb 11.7 oz (72 kg)] 158 lb 11.7 oz (72 kg) (01/04 0500) HEMODYNAMICS: CVP:  [10 mmHg-15 mmHg] 11 mmHg  Vent Mode:  [-] PRVC FiO2 (%):  [40 %] 40  % Set Rate:  [18 bmp] 18 bmp Vt Set:  [500 mL] 500 mL PEEP:  [5 cmH20] 5 cmH20 Plateau Pressure:  [11 cmH20-20 cmH20] 20 cmH20 INTAKE / OUTPUT: Intake/Output      01/03 0701 - 01/04 0700 01/04 0701 - 01/05 0700   I.V. (mL/kg) 1553.1 (21.6) 62 (0.9)   NG/GT 1320 50   IV Piggyback 500    TPN 337.5    Total Intake(mL/kg) 3710.6 (51.5) 112 (1.6)   Urine (mL/kg/hr) 995 (0.6) 60 (0.3)   Emesis/NG output     Drains     Total Output 995 60   Net +2715.6 +52        Stool Occurrence 2 x      PHYSICAL EXAMINATION:  Gen: no distress HEENT: ETT in place PULM: no wheeze CV: regular, tachycardic AB: wound dressing clean Derm: no rashes Ext: 1+ edema Neuro: RASS -1  LABS: CBC Recent Labs     10/10/14  0415  10/11/14  0400  10/12/14  0415  WBC  30.3*  21.1*  20.0*  HGB  9.8*  8.3*  8.0*  HCT  31.8*  27.5*  26.5*  PLT  540*  453*  486*    BMET Recent Labs     10/10/14  1708  10/11/14  0400  10/12/14  0415  NA  140  137  144  K  4.3  4.3  3.9  CL  106  108  111  CO2  28  27  29   BUN  16  18  18  CREATININE  0.84  0.75  0.74  GLUCOSE  254*  315*  221*    Electrolytes Recent Labs     10/10/14  0415  10/10/14  1708  10/11/14  0400  10/12/14  0415  CALCIUM  7.3*  7.7*  7.6*  7.9*  MG  2.2  2.0  2.1  2.1  PHOS  2.5   --   2.0*   --     Sepsis Markers Recent Labs     10/10/14  0415  10/11/14  0400  PROCALCITON  38.50  19.70    ABG Recent Labs     10/09/14  1422  PHART  7.369  PCO2ART  41.9  PO2ART  106.0*    Liver Enzymes Recent Labs     10/11/14  0400  AST  294*  ALT  226*  ALKPHOS  128*  BILITOT  0.7  ALBUMIN  2.0*    Cardiac Enzymes Recent Labs     10/09/14  1600  TROPONINI  2.31*    Glucose Recent Labs     10/11/14  1235  10/11/14  1554  10/11/14  2000  10/11/14  2330  10/12/14  0427  10/12/14  0839  GLUCAP  269*  205*  153*  169*  201*  149*    Imaging Dg Chest Port 1 View  10/12/2014   CLINICAL DATA:  Followup acute  respiratory failure  EXAM: PORTABLE CHEST - 1 VIEW  COMPARISON:  10/11/2014  FINDINGS: The endotracheal tube, nasogastric catheter and left jugular central venous line are again seen and stable. Bibasilar airspace disease likely representing atelectasis is again identified. No new focal confluent infiltrate is seen. No pneumothorax or sizable effusion is noted. No bony abnormality is seen.  IMPRESSION: Stable appearance of the chest from the previous day.   Electronically Signed   By: Inez Catalina M.D.   On: 10/12/2014 07:56   Dg Chest Port 1 View  10/11/2014   CLINICAL DATA:  Acute respiratory failure with hypoxia.  EXAM: PORTABLE CHEST - 1 VIEW  COMPARISON:  1 day prior  FINDINGS: Support apparatus: Endotracheal tube 2.7 cm above carina. Left internal jugular line tip mid SVC. Nasogastric tube terminates at the distal stomach. Numerous leads and wires project over the chest. Lower cervical spine fixation.  Cardiomediastinal silhouette: Normal heart size.  Pleura: No right and no definite left pleural effusion. No pneumothorax.  Lungs: Low lung volumes. Similar left and developing right base airspace disease.  Other: Contrast within left upper abdominal bowel loops.  IMPRESSION: Similar left and developing right base airspace disease. Favor atelectasis.  Low lung volumes.   Electronically Signed   By: Abigail Miyamoto M.D.   On: 10/11/2014 07:51    ASSESSMENT / PLAN:  PULMONARY ETT 12/19 >> 12/29, 12/31 >> A:  Acute respiratory failure with hypoxemia > due to MRSA HCAP. Difficult airway 12/31 due to cord edema. Hx of COPD. P:   Pressure support wean as tolerated Plan for trach later this week F/u CXR PRN BD's  CARDIOVASCULAR A: Septic Shock 1/1 2nd to HCAP >> improving. Demand ischemia 1/1. Lt brachial/radial/ulnar artery thrombectomy. P:   Monitor hemodyamics Goal CVP 8 to 12 Post-op care per [facility] Full dose lovenox >> will need to transition to oral anticoagulant when more stable Continue  ASA  RENAL A: AKI 2nd to sepsis >> resolved. P:   Monitor renal fx, urine outpt, electrolytes   GASTROINTESTINAL A: Perforated gastric ulcer and pneumoperitoneum s/p  laparotomy. Hx of GERD. P:   Post-op care per [facility] Protonix for SUP Continue tube feeds  HEMATOLOGIC A: Arterial clot as noted above Anemia without clear evidence of bleeding P:   F/u CBC Lovenox for DVT prevention  INFECTIOUS A: Peritonitis after bowel perforation 12/20 > Candida glabrata peritonitis and fungemia. Septic shock 1/1 > due to MRSA HCAP, procalcitonin 38.5. P:   Day 13 of micafungin for peritonitis, fungemia Day 5 of Abx for HCAP >> Day 4 of vancomycin, add fortaz 1/04  Blood 12/19 >> Candida glabrata Peritoneal fluid 12/20 >> Candida glabrata Sputum 12/31 >> Pseudomonas aeruginosa, MRSA  ENDOCRINE A:  Hyperglycemia. Relative adrenal insufficiency. P:   SSI with Lantus Continue solucortef >> if BP stable, then start to wean off 1/05   NEUROLOGIC A: Post op pain. Hx of multiple sclerosis, fibromyalgia, peripheral neuropathy, Guillian Barre syndrome. Acute encephalopathy after cardiac arrest 1/1 > worrisome for anoxic injury but making slow improvement 1/3. P:   RASS goal -1 Fentanyl, versed F/u EEG 1/02 >> Hold outpt celexa, klonopin, neurontin, flexeril   CC time 50 minutes.  [name], MD [facility] Pulmonary/Critical Care 10/12/2014, 10:38 AM Pager:  [phone] After 3pm call: [phone]

## 2014-10-12 NOTE — Progress Notes (Signed)
15 Days Post-Op  Subjective: Intubated, minimally responsive Tube feeds at goal rate - off TNA  Objective: Vital signs in last 24 hours: Temp:  [98.5 F (36.9 C)-99.7 F (37.6 C)] 99.3 F (37.4 C) (01/04 0400) Pulse Rate:  [70-117] 93 (01/04 0800) Resp:  [10-23] 21 (01/04 0800) BP: (80-119)/(43-80) 107/57 mmHg (01/04 0800) SpO2:  [87 %-100 %] 97 % (01/04 0800) FiO2 (%):  [40 %] 40 % (01/04 0800) Weight:  [158 lb 11.7 oz (72 kg)] 158 lb 11.7 oz (72 kg) (01/04 0500) Last BM Date: 10/11/14  Intake/Output from previous day: 01/03 0701 - 01/04 0700 In: 3710.6 [I.V.:1553.1; NG/GT:1320; IV Piggyback:500; TPN:337.5] Out: 995 [Urine:995] Intake/Output this shift: Total I/O In: 112 [I.V.:62; NG/GT:50] Out: 60 [Urine:60]  General appearance: intubated, does not follow commands Abdominal wound clean, granulating well; minimal drainage  Lab Results:   Recent Labs  10/11/14 0400 10/12/14 0415  WBC 21.1* 20.0*  HGB 8.3* 8.0*  HCT 27.5* 26.5*  PLT 453* 486*   BMET  Recent Labs  10/11/14 0400 10/12/14 0415  NA 137 144  K 4.3 3.9  CL 108 111  CO2 27 29  GLUCOSE 315* 221*  BUN 18 18  CREATININE 0.75 0.74  CALCIUM 7.6* 7.9*   PT/INR No results for input(s): LABPROT, INR in the last 72 hours. ABG  Recent Labs  10/09/14 0847 10/09/14 1422  PHART 7.515* 7.369  HCO3 22.9 23.6    Studies/Results: Dg Chest Port 1 View  10/12/2014   CLINICAL DATA:  Followup acute respiratory failure  EXAM: PORTABLE CHEST - 1 VIEW  COMPARISON:  10/11/2014  FINDINGS: The endotracheal tube, nasogastric catheter and left jugular central venous line are again seen and stable. Bibasilar airspace disease likely representing atelectasis is again identified. No new focal confluent infiltrate is seen. No pneumothorax or sizable effusion is noted. No bony abnormality is seen.  IMPRESSION: Stable appearance of the chest from the previous day.   Electronically Signed   By: Inez Catalina M.D.   On:  10/12/2014 07:56   Dg Chest Port 1 View  10/11/2014   CLINICAL DATA:  Acute respiratory failure with hypoxia.  EXAM: PORTABLE CHEST - 1 VIEW  COMPARISON:  1 day prior  FINDINGS: Support apparatus: Endotracheal tube 2.7 cm above carina. Left internal jugular line tip mid SVC. Nasogastric tube terminates at the distal stomach. Numerous leads and wires project over the chest. Lower cervical spine fixation.  Cardiomediastinal silhouette: Normal heart size.  Pleura: No right and no definite left pleural effusion. No pneumothorax.  Lungs: Low lung volumes. Similar left and developing right base airspace disease.  Other: Contrast within left upper abdominal bowel loops.  IMPRESSION: Similar left and developing right base airspace disease. Favor atelectasis.  Low lung volumes.   Electronically Signed   By: Abigail Miyamoto M.D.   On: 10/11/2014 07:51    Anti-infectives: Anti-infectives    Start     Dose/Rate Route Frequency Ordered Stop   10/09/14 1400  vancomycin (VANCOCIN) IVPB 1000 mg/200 mL premix     1,000 mg200 mL/hr over 60 Minutes Intravenous Every 12 hours 10/09/14 1303     10/09/14 1400  meropenem (MERREM) 1 g in sodium chloride 0.9 % 100 mL IVPB  Status:  Discontinued     1 g200 mL/hr over 30 Minutes Intravenous 3 times per day 10/09/14 1303 10/11/14 1211   10/09/14 1200  cefTRIAXone (ROCEPHIN) 1 g in dextrose 5 % 50 mL IVPB - Premix  Status:  Discontinued  1 g100 mL/hr over 30 Minutes Intravenous Every 24 hours 10/08/14 1519 10/09/14 1255   10/08/14 2200  metroNIDAZOLE (FLAGYL) IVPB 500 mg  Status:  Discontinued     500 mg100 mL/hr over 60 Minutes Intravenous 3 times per day 10/08/14 1520 10/11/14 1211   10/08/14 1400  metroNIDAZOLE (FLAGYL) IVPB 500 mg  Status:  Discontinued     500 mg100 mL/hr over 60 Minutes Intravenous 3 times per day 10/08/14 0945 10/08/14 1516   10/08/14 1000  cefTRIAXone (ROCEPHIN) 1 g in dextrose 5 % 50 mL IVPB - Premix  Status:  Discontinued     1 g100 mL/hr over 30  Minutes Intravenous Every 24 hours 10/08/14 0945 10/08/14 1516   09/30/14 1300  micafungin (MYCAMINE) 100 mg in sodium chloride 0.9 % 100 mL IVPB    Comments:  Pharmacy may adjust as needed   100 mg100 mL/hr over 1 Hours Intravenous Every 24 hours 09/30/14 1213     09/27/14 2100  ceFEPIme (MAXIPIME) 2 g in dextrose 5 % 50 mL IVPB  Status:  Discontinued     2 g100 mL/hr over 30 Minutes Intravenous Every 24 hours 09/27/14 0603 09/27/14 1103   09/27/14 2000  vancomycin (VANCOCIN) IVPB 1000 mg/200 mL premix  Status:  Discontinued     1,000 mg200 mL/hr over 60 Minutes Intravenous Every 24 hours 09/27/14 0603 09/27/14 1103   09/27/14 1400  vancomycin (VANCOCIN) IVPB 1000 mg/200 mL premix  Status:  Discontinued     1,000 mg200 mL/hr over 60 Minutes Intravenous Every 12 hours 09/27/14 1103 10/02/14 1348   09/27/14 1200  ceFEPIme (MAXIPIME) 2 g in dextrose 5 % 50 mL IVPB  Status:  Discontinued     2 g100 mL/hr over 30 Minutes Intravenous Every 12 hours 09/27/14 1103 10/02/14 1348   09/26/14 1930  vancomycin (VANCOCIN) IVPB 1000 mg/200 mL premix     1,000 mg200 mL/hr over 60 Minutes Intravenous  Once 09/26/14 1917 09/26/14 2210   09/26/14 1930  ceFEPIme (MAXIPIME) 2 g in dextrose 5 % 50 mL IVPB     2 g100 mL/hr over 30 Minutes Intravenous  Once 09/26/14 1917 09/26/14 2132      Assessment/Plan: s/p Procedure(s): Left Radial, Brachial, and Ulnar Thrombectomy; Left Brachial to Radial Bypass Graft using Greater Saphenous vein graft from Left Thigh; Left Saphenous Vein Harvest; Intraoperative Arteriogram; Intra-arterial administration of TPA (Left) Two weeks s/p repair of perforated gastric ulcer  No acute surgical issues Continue tube feeds at goal rate Continue dressing changes.   LOS: 16 days    Tammy Gullickson K. 10/12/2014

## 2014-10-13 ENCOUNTER — Inpatient Hospital Stay (HOSPITAL_COMMUNITY): Payer: BLUE CROSS/BLUE SHIELD

## 2014-10-13 LAB — GLUCOSE, CAPILLARY
GLUCOSE-CAPILLARY: 176 mg/dL — AB (ref 70–99)
GLUCOSE-CAPILLARY: 126 mg/dL — AB (ref 70–99)
Glucose-Capillary: 183 mg/dL — ABNORMAL HIGH (ref 70–99)
Glucose-Capillary: 89 mg/dL (ref 70–99)

## 2014-10-13 LAB — BASIC METABOLIC PANEL
Anion gap: 6 (ref 5–15)
BUN: 13 mg/dL (ref 6–23)
CALCIUM: 7.8 mg/dL — AB (ref 8.4–10.5)
CO2: 31 mmol/L (ref 19–32)
Chloride: 108 mEq/L (ref 96–112)
Creatinine, Ser: 0.68 mg/dL (ref 0.50–1.10)
GFR calc Af Amer: >90 mL/min (ref >90)
GLUCOSE: 208 mg/dL — AB (ref 70–99)
POTASSIUM: 3.1 mmol/L — AB (ref 3.5–5.1)
Sodium: 145 mmol/L (ref 135–145)

## 2014-10-13 LAB — CBC
HCT: 28.7 % — ABNORMAL LOW (ref 36.0–46.0)
HEMOGLOBIN: 8.9 g/dL — AB (ref 12.0–15.0)
MCH: 32.5 pg (ref 26.0–34.0)
MCHC: 31 g/dL (ref 30.0–36.0)
MCV: 104.7 fL — AB (ref 78.0–100.0)
Platelets: 609 10*3/uL — ABNORMAL HIGH (ref 150–400)
RBC: 2.74 MIL/uL — ABNORMAL LOW (ref 3.87–5.11)
RDW: 14.7 % (ref 11.5–15.5)
WBC: 22.8 10*3/uL — ABNORMAL HIGH (ref 4.0–10.5)

## 2014-10-13 LAB — APTT: aPTT: 32 seconds (ref 24–37)

## 2014-10-13 LAB — PROTIME-INR
INR: 1.17 (ref 0.00–1.49)
Prothrombin Time: 15.1 seconds (ref 11.6–15.2)

## 2014-10-13 MED ORDER — VECURONIUM BROMIDE 10 MG IV SOLR
10.0000 mg | Freq: Once | INTRAVENOUS | Status: AC
Start: 2014-10-13 — End: 2014-10-14
  Administered 2014-10-14: 6 mg via INTRAVENOUS
  Filled 2014-10-13: qty 10

## 2014-10-13 MED ORDER — POTASSIUM CHLORIDE 20 MEQ/15ML (10%) PO SOLN
30.0000 meq | ORAL | Status: AC
  Administered 2014-10-13 (×2): 30 meq
  Filled 2014-10-13 (×2): qty 22.5

## 2014-10-13 MED ORDER — MIDAZOLAM HCL 2 MG/2ML IJ SOLN
4.0000 mg | Freq: Once | INTRAMUSCULAR | Status: AC
  Administered 2014-10-14: 2 mg via INTRAVENOUS

## 2014-10-13 MED ORDER — FENTANYL CITRATE 0.05 MG/ML IJ SOLN
200.0000 ug | Freq: Once | INTRAMUSCULAR | Status: AC
  Administered 2014-10-14: 200 ug via INTRAVENOUS

## 2014-10-13 MED ORDER — PROPOFOL 10 MG/ML IV EMUL
5.0000 ug/kg/min | Freq: Once | INTRAVENOUS | Status: AC
  Administered 2014-10-14: 35 ug/kg/min via INTRAVENOUS
  Filled 2014-10-13: qty 100

## 2014-10-13 MED ORDER — ETOMIDATE 2 MG/ML IV SOLN
40.0000 mg | Freq: Once | INTRAVENOUS | Status: DC
  Filled 2014-10-13 (×2): qty 20

## 2014-10-13 MED ORDER — HYDROCORTISONE NA SUCCINATE PF 100 MG IJ SOLR
50.0000 mg | Freq: Every day | INTRAMUSCULAR | Status: DC
  Administered 2014-10-14 – 2014-10-15 (×2): 50 mg via INTRAVENOUS
  Filled 2014-10-13 (×3): qty 1

## 2014-10-13 NOTE — Progress Notes (Signed)
[facility] ADULT ICU REPLACEMENT PROTOCOL FOR AM LAB REPLACEMENT ONLY  The patient does apply for the [facility] Adult ICU Electrolyte Replacment Protocol based on the criteria listed below:   1. Is GFR >/= 40 ml/min? Yes.    Patient's GFR today is >90 2. Is urine output >/= 0.5 ml/kg/hr for the last 6 hours? Yes.   Patient's UOP is .5 ml/kg/hr 3. Is BUN < 60 mg/dL? Yes.    Patient's BUN today is 13 4. Abnormal electrolyte 3.1 5. Ordered repletion with: per protocol 6. If a panic level lab has been reported, has the CCM MD in charge been notified? Yes.  .   Physician:  Antoine Primas 10/13/2014 5:43 AM

## 2014-10-13 NOTE — Progress Notes (Signed)
NUTRITION FOLLOW-UP / CONSULT  DOCUMENTATION CODES Per approved criteria  -Obesity Unspecified   INTERVENTIONS: - Continue Vital AF 1.2 at 50 ml/h to provide 1440 kcals, 90 gm protein, 973 ml free water daily. - RD will continue to monitor  NUTRITION DIAGNOSIS: Inadequate oral intake related to inability to eat as evidenced by NPO s/p bowel surgery, peritonitis; ongoing.   Goal:  Enteral nutrition to provide 60-70% of estimated calorie needs (22-25 kcals/kg ideal body weight) and 100% of estimated protein needs, based on ASPEN guidelines for hypocaloric, high protein feeding in critically ill obese individuals, ongoing.  Monitor:  TF tolerance/adequacy, weight trend, labs, vent status.  ASSESSMENT:  12/29-Pt with h/o MS admitted with abdominal pain.  Patient found to have pneumoperitoneum 2/2 to small bowel rupture.  Also with brachial artery clot s/p surgery to repair both.    1/2 Pt re-intubated on ventilator.   Patient is currently intubated on ventilator support MV: 9.0 L/min Temp (24hrs), Avg:99.7 F (37.6 C), Min:99 F (37.2 C), Max:100.9 F (38.3 C)  Propofol: none  - Pt tolerating Vital AF 1.2 @ 50 ml/hr. TPN weaned and d/c'd 1/3.  - Per MD note, plan for trach later this week.   Labs: CBGs: 110-183 K low Na WNL Phos low Mg WNL  Height: Ht Readings from Last 1 Encounters:  09/27/14 4\' 9"  (1.448 m)    Weight: Wt Readings from Last 1 Encounters:  10/13/14 159 lb 13.3 oz (72.5 kg)   10/05/14 166 lb (75.297 kg)   10/01/14 176 lb 5.9 oz (80 kg)    BMI:  Body mass index is 34.58 kg/(m^2).  Estimated Nutritional Needs: Kcal: 1400-1600 Protein: >/= 85 grams Fluid: 1.5 L/day  Skin: surgical incisions  Diet Order: Diet NPO time specified Diet NPO time specified   Intake/Output Summary (Last 24 hours) at 10/13/14 1520 Last data filed at 10/13/14 1400  Gross per 24 hour  Intake   2971 ml  Output   1215 ml  Net   1756 ml    Last BM:  1/04  Labs:   Recent Labs Lab 10/09/14 0230 10/10/14 0415 10/10/14 1708 10/11/14 0400 10/12/14 0415 10/13/14 0345  NA 148* 138 140 137 144 145  K 4.3 3.0* 4.3 4.3 3.9 3.1*  CL 108 104 106 108 111 108  CO2 27 26 28 27 29 31   BUN 20 23 16 18 18 13   CREATININE 0.96 1.07 0.84 0.75 0.74 0.68  CALCIUM 8.3* 7.3* 7.7* 7.6* 7.9* 7.8*  MG 2.2 2.2 2.0 2.1 2.1  --   PHOS 3.3 2.5  --  2.0*  --   --   GLUCOSE 144* 324* 254* 315* 221* 208*    CBG (last 3)   Recent Labs  10/12/14 1931 10/12/14 2322 10/13/14 0356  GLUCAP 110* 176* 183*    Scheduled Meds: . antiseptic oral rinse  7 mL Mouth Rinse QID  . aspirin  325 mg Per Tube Daily  . cefTAZidime (FORTAZ)  IV  1 g Intravenous 3 times per day  . chlorhexidine  15 mL Mouth Rinse BID  . enoxaparin (LOVENOX) injection  70 mg Subcutaneous Q12H  . etomidate  40 mg Intravenous Once  . fentaNYL  200 mcg Intravenous Once  . [START ON 10/14/2014] hydrocortisone sodium succinate  50 mg Intravenous Daily  . insulin aspart  0-9 Units Subcutaneous 6 times per day  . insulin glargine  10 Units Subcutaneous Daily  . micafungin [facility]) IV  100 mg Intravenous Q24H  .  midazolam  4 mg Intravenous Once  . pantoprazole sodium  40 mg Per Tube Q1200  . propofol  5-80 mcg/kg/min Intravenous Once  . sodium chloride  10-40 mL Intracatheter Q12H  . vancomycin  750 mg Intravenous Q12H  . vecuronium  10 mg Intravenous Once    Continuous Infusions: . sodium chloride 20 mL/hr at 10/12/14 2200  . feeding supplement (VITAL AF 1.2 CAL) 1,000 mL (10/13/14 1400)  . fentaNYL infusion INTRAVENOUS 400 mcg/hr (10/13/14 1000)  . midazolam (VERSED) infusion 4 mg/hr (10/13/14 1100)    Laurette Schimke MS, RD, LDN

## 2014-10-13 NOTE — Progress Notes (Addendum)
   [facility]  Subjective  - intubated, follows commands to move extremities.   Objective 96/68 77 99.4 F (37.4 C) (Axillary) 18 100%  Intake/Output Summary (Last 24 hours) at 10/13/14 0756 Last data filed at 10/13/14 0700  Gross per 24 hour  Intake   3145 ml  Output   1295 ml  Net   1850 ml    Palpable radial, DP pulses bilaterally 3+ Masseration to finger tips s/p blisters, dry wash cloths placed in palms. Left thigh incision with incisional separation superficial with no drainage or erythema.  Dry guaze placed over incision.  Assessment/Planning: 49 y.o. female is s/p:  #1: Thrombectomy of left brachial, radial, and ulnar arteries #2: Patch angioplasty of left brachial and ulnar artery #3: Patch angioplasty left radial artery And take back to OR later that day for:  1. Redo Left brachial, ulnar, and radial exposure 2. Thrombectomy Left brachial, ulnar, adn radial artery 3. Angiogram, left upper extremity 4. Left brachial to left radial artery bypass with left leg non-reversed greater saphenous vein 5. Intra-arterial injection of TPA 16 Days Post-Op  Palpable pulses stable all extremities.  Finger tips viable.  Keep hands clean and dry. Lovenox DVT prophylaxis. TEE on 10/05/14 revealed no evidence of cardiac emboli.   Laurence Slate [facility] 10/13/2014 7:56 AM --  Laboratory Lab Results:  Recent Labs  10/12/14 0415 10/13/14 0345  WBC 20.0* 22.8*  HGB 8.0* 8.9*  HCT 26.5* 28.7*  PLT 486* 609*   BMET  Recent Labs  10/12/14 0415 10/13/14 0345  NA 144 145  K 3.9 3.1*  CL 111 108  CO2 29 31  GLUCOSE 221* 208*  BUN 18 13  CREATININE 0.74 0.68  CALCIUM 7.9* 7.8*    COAG Lab Results  Component Value Date   INR 1.25 10/05/2014     No results found for: PTT    I agree with the above  Annamarie Major

## 2014-10-13 NOTE — Progress Notes (Signed)
PULMONARY / CRITICAL CARE MEDICINE HISTORY AND PHYSICAL EXAMINATION   Name: [name] MRN: [id number] DOB: Oct 11, 1965    ADMISSION DATE:  09/26/2014  PRIMARY SERVICE: PCCM  CHIEF COMPLAINT:  Abd pain  BRIEF PATIENT DESCRIPTION:  7yof smoker with PMH of MS, depression presented 12/19 on transfer from [facility] with abd pain, found to have pneumoperitoneum 2/2 small bowel rupture, also with new cold L hand.  Required surgery for DU perf & left brachial bypass, fungemia noted on admission cultures.  SIGNIFICANT EVENTS / STUDIES:  12/19 Admitted, CT Abd with pneumoperitoneum, cold L hand concerning for arterial clot as well. 12/20 ex-lap [name]) with omental patch for perforated prepyloric gastric ulcer; L brachial and ulnar artery embolectomy ([place]) 12/20 LE doppler >>neg 12/20 Left brachial to left radial artery bypass with left leg greater saphenous vein for recurrent occlusion  12/20 Self extubated - had to be reintubated 12/21 echo >>nml LVEF 12/26 CT abdomen >> pelvic fluid 12/28 TEE neg 10-10-23 reintubated, coded 4 minutes; CT head without acute change 01/01 High dose pressors, CT abdomen> no fluid collection noted; LLL pneumonia 01/03 PSV 4 hours, Hydrocortisone added, following simple commands, pressors improved, oxygenation improved 01/04 off pressors  LINES / TUBES: R IJ CVL 12/20 >> 12/29 LIJ CVL 12/28 >> JP drain RUQ 12/20 >>  ETT 12/19 >> 12/29, 12/31>>  SUBJECTIVE:  Has periods of apnea, hypoxia with pressure support this AM.  VITAL SIGNS: Temp:  [99 F (37.2 C)-100.1 F (37.8 C)] 99.4 F (37.4 C) (01/05 0700) Pulse Rate:  [74-132] 103 (01/05 0800) Resp:  [17-21] 17 (01/05 0800) BP: (75-143)/(37-91) 95/63 mmHg (01/05 0800) SpO2:  [91 %-100 %] 98 % (01/05 0800) FiO2 (%):  [40 %] 40 % (01/05 0800) Weight:  [159 lb 13.3 oz (72.5 kg)] 159 lb 13.3 oz (72.5 kg) (01/05 0500) HEMODYNAMICS: CVP:  [11 mmHg-18 mmHg] 13 mmHg  Vent Mode:  [-] PSV FiO2 (%):   [40 %] 40 % Set Rate:  [18 bmp] 18 bmp Vt Set:  [500 mL] 500 mL PEEP:  [5 cmH20] 5 cmH20 Pressure Support:  [10 cmH20] 10 cmH20 Plateau Pressure:  [17 cmH20-21 cmH20] 21 cmH20 INTAKE / OUTPUT: Intake/Output      01/04 0701 - 01/05 0700 01/05 0701 - 01/06 0700   I.V. (mL/kg) 1355 (18.7) 104 (1.4)   NG/GT 1240 130   IV Piggyback 550    TPN     Total Intake(mL/kg) 3145 (43.4) 234 (3.2)   Urine (mL/kg/hr) 895 (0.5) 160 (0.6)   Stool 400 (0.2)    Total Output 1295 160   Net +1850 +74          PHYSICAL EXAMINATION:  Gen: no distress HEENT: ETT in place PULM: no wheeze CV: regular, tachycardic AB: wound dressing clean Derm: no rashes Ext: 1+ edema Neuro: RASS -1  LABS: CBC Recent Labs     10/11/14  0400  10/12/14  0415  10/13/14  0345  WBC  21.1*  20.0*  22.8*  HGB  8.3*  8.0*  8.9*  HCT  27.5*  26.5*  28.7*  PLT  453*  486*  609*    BMET Recent Labs     10/11/14  0400  10/12/14  0415  10/13/14  0345  NA  137  144  145  K  4.3  3.9  3.1*  CL  108  111  108  CO2  27  29  31   BUN  18  18  13  CREATININE  0.75  0.74  0.68  GLUCOSE  315*  221*  208*    Electrolytes Recent Labs     10/10/14  1708  10/11/14  0400  10/12/14  0415  10/13/14  0345  CALCIUM  7.7*  7.6*  7.9*  7.8*  MG  2.0  2.1  2.1   --   PHOS   --   2.0*   --    --     Sepsis Markers Recent Labs     10/11/14  0400  PROCALCITON  19.70    ABG No results for input(s): PHART, PCO2ART, PO2ART in the last 72 hours.  Liver Enzymes Recent Labs     10/11/14  0400  AST  294*  ALT  226*  ALKPHOS  128*  BILITOT  0.7  ALBUMIN  2.0*    Cardiac Enzymes No results for input(s): TROPONINI, PROBNP in the last 72 hours.  Glucose Recent Labs     10/12/14  0839  10/12/14  1230  10/12/14  1651  10/12/14  1931  10/12/14  2322  10/13/14  0356  GLUCAP  149*  225*  130*  110*  176*  183*    Imaging Dg Chest Port 1 View  10/13/2014   CLINICAL DATA:  Healthcare associated pneumonia   EXAM: PORTABLE CHEST - 1 VIEW  COMPARISON:  10/12/2014  FINDINGS: Cardiomediastinal silhouette is stable. Central mild vascular congestion without convincing pulmonary edema. Stable endotracheal tube position. Stable left IJ central line position and NG tube position. Persistent bilateral basilar atelectasis or infiltrate. Probable small left pleural effusion.  IMPRESSION: Stable support apparatus. Central mild vascular congestion without convincing pulmonary edema. Persistent bilateral basilar atelectasis or infiltrate. Probable small left pleural effusion.   Electronically Signed   By: Lahoma Crocker M.D.   On: 10/13/2014 08:12   Dg Chest Port 1 View  10/12/2014   CLINICAL DATA:  Followup acute respiratory failure  EXAM: PORTABLE CHEST - 1 VIEW  COMPARISON:  10/11/2014  FINDINGS: The endotracheal tube, nasogastric catheter and left jugular central venous line are again seen and stable. Bibasilar airspace disease likely representing atelectasis is again identified. No new focal confluent infiltrate is seen. No pneumothorax or sizable effusion is noted. No bony abnormality is seen.  IMPRESSION: Stable appearance of the chest from the previous day.   Electronically Signed   By: Inez Catalina M.D.   On: 10/12/2014 07:56    ASSESSMENT / PLAN:  PULMONARY ETT 12/19 >> 12/29, 12/31 >> A:  Acute respiratory failure with hypoxemia > due to MRSA HCAP. Difficult airway 12/31 due to cord edema. Hx of COPD. Prior hx of tracheostomy. P:   Pressure support wean as tolerated Plan for trach later this week F/u CXR PRN BD's  CARDIOVASCULAR A: Septic Shock 1/1 2nd to HCAP >> off pressors. Demand ischemia 1/1. Lt brachial/radial/ulnar artery thrombectomy. P:   Monitor hemodyamics Goal CVP 8 to 12 Post-op care per VVS Full dose lovenox >> will need to transition to oral anticoagulant when more stable Continue ASA  RENAL A: AKI 2nd to sepsis >> resolved. P:   Monitor renal fx, urine outpt, electrolytes    GASTROINTESTINAL A: Perforated gastric ulcer and pneumoperitoneum s/p laparotomy. Hx of GERD. P:   Post-op care per CCS Protonix for SUP Continue tube feeds  HEMATOLOGIC A: Anemia of critical illness. P:   F/u CBC Lovenox for DVT prevention  INFECTIOUS A: Peritonitis after bowel perforation 12/20 > Candida glabrata peritonitis and fungemia.  Septic shock 1/1 > due to MRSA HCAP, procalcitonin 38.5. P:   Day 14 of micafungin for peritonitis, fungemia Day 6 of Abx for HCAP >> Day 5 of vancomycin, day 2  fortaz   Blood 12/19 >> Candida glabrata Peritoneal fluid 12/20 >> Candida glabrata Sputum 12/31 >> Pseudomonas aeruginosa, MRSA  ENDOCRINE A:  Hyperglycemia. Relative adrenal insufficiency. P:   SSI with Lantus Continue solucortef >> if BP stable, then start to wean off 1/05   NEUROLOGIC A: Post op pain. Hx of multiple sclerosis, fibromyalgia, peripheral neuropathy, Guillian Barre syndrome. Acute encephalopathy after cardiac arrest 1/1 > worrisome for anoxic injury but making slow improvement 1/3. P:   RASS goal -1 Fentanyl, versed F/u EEG 1/02 >> Hold outpt celexa, klonopin, neurontin, flexeril   CC time 35 minutes.  [name], MD [facility] Pulmonary/Critical Care 10/13/2014, 10:59 AM Pager:  [phone] After 3pm call: [phone]

## 2014-10-13 NOTE — Progress Notes (Signed)
Patient ID: [name], female   DOB: [birth year], 49 y.o.   MRN: [id number] 16 Days Post-Op  Subjective: Intubated, following commands today per RN.  Tolerating TFs at goal rate  Objective: Vital signs in last 24 hours: Temp:  [99 F (37.2 C)-100.1 F (37.8 C)] 99.4 F (37.4 C) (01/05 0700) Pulse Rate:  [74-132] 113 (01/05 0756) Resp:  [12-21] 18 (01/05 0756) BP: (75-143)/(37-91) 96/68 mmHg (01/05 0756) SpO2:  [91 %-100 %] 100 % (01/05 0756) FiO2 (%):  [40 %] 40 % (01/05 0756) Weight:  [159 lb 13.3 oz (72.5 kg)] 159 lb 13.3 oz (72.5 kg) (01/05 0500) Last BM Date: 10/12/14  Intake/Output from previous day: 01/04 0701 - 01/05 0700 In: 3145 [I.V.:1355; NG/GT:1240; IV Piggyback:550] Out: 1295 [Urine:895; Stool:400] Intake/Output this shift:    PE: Abd: soft, wound is clean and packed, +BS  Lab Results:   Recent Labs  10/12/14 0415 10/13/14 0345  WBC 20.0* 22.8*  HGB 8.0* 8.9*  HCT 26.5* 28.7*  PLT 486* 609*   BMET  Recent Labs  10/12/14 0415 10/13/14 0345  NA 144 145  K 3.9 3.1*  CL 111 108  CO2 29 31  GLUCOSE 221* 208*  BUN 18 13  CREATININE 0.74 0.68  CALCIUM 7.9* 7.8*   PT/INR No results for input(s): LABPROT, INR in the last 72 hours. CMP     Component Value Date/Time   NA 145 10/13/2014 0345   K 3.1* 10/13/2014 0345   CL 108 10/13/2014 0345   CO2 31 10/13/2014 0345   GLUCOSE 208* 10/13/2014 0345   BUN 13 10/13/2014 0345   CREATININE 0.68 10/13/2014 0345   CREATININE 0.80 12/08/2013 1313   CALCIUM 7.8* 10/13/2014 0345   PROT 6.8 10/11/2014 0400   ALBUMIN 2.0* 10/11/2014 0400   AST 294* 10/11/2014 0400   ALT 226* 10/11/2014 0400   ALKPHOS 128* 10/11/2014 0400   BILITOT 0.7 10/11/2014 0400   GFRNONAA >90 10/13/2014 0345   GFRAA >90 10/13/2014 0345   Lipase     Component Value Date/Time   LIPASE 19 09/26/2014 2056       Studies/Results: Dg Chest Port 1 View  10/12/2014   CLINICAL DATA:  Followup acute respiratory failure  EXAM:  PORTABLE CHEST - 1 VIEW  COMPARISON:  10/11/2014  FINDINGS: The endotracheal tube, nasogastric catheter and left jugular central venous line are again seen and stable. Bibasilar airspace disease likely representing atelectasis is again identified. No new focal confluent infiltrate is seen. No pneumothorax or sizable effusion is noted. No bony abnormality is seen.  IMPRESSION: Stable appearance of the chest from the previous day.   Electronically Signed   By: Inez Catalina M.D.   On: 10/12/2014 07:56    Anti-infectives: Anti-infectives    Start     Dose/Rate Route Frequency Ordered Stop   10/12/14 1800  vancomycin (VANCOCIN) IVPB 750 mg/150 ml premix     750 mg150 mL/hr over 60 Minutes Intravenous Every 12 hours 10/12/14 1518     10/12/14 1400  cefTAZidime (FORTAZ) 1 g in dextrose 5 % 50 mL IVPB     1 g100 mL/hr over 30 Minutes Intravenous 3 times per day 10/12/14 1058 10/16/14 0559   10/09/14 1400  vancomycin (VANCOCIN) IVPB 1000 mg/200 mL premix  Status:  Discontinued     1,000 mg200 mL/hr over 60 Minutes Intravenous Every 12 hours 10/09/14 1303 10/12/14 1518   10/09/14 1400  meropenem (MERREM) 1 g in sodium chloride 0.9 % 100 mL  IVPB  Status:  Discontinued     1 g200 mL/hr over 30 Minutes Intravenous 3 times per day 10/09/14 1303 10/11/14 1211   10/09/14 1200  cefTRIAXone (ROCEPHIN) 1 g in dextrose 5 % 50 mL IVPB - Premix  Status:  Discontinued     1 g100 mL/hr over 30 Minutes Intravenous Every 24 hours 10/08/14 1519 10/09/14 1255   10/08/14 2200  metroNIDAZOLE (FLAGYL) IVPB 500 mg  Status:  Discontinued     500 mg100 mL/hr over 60 Minutes Intravenous 3 times per day 10/08/14 1520 10/11/14 1211   10/08/14 1400  metroNIDAZOLE (FLAGYL) IVPB 500 mg  Status:  Discontinued     500 mg100 mL/hr over 60 Minutes Intravenous 3 times per day 10/08/14 0945 10/08/14 1516   10/08/14 1000  cefTRIAXone (ROCEPHIN) 1 g in dextrose 5 % 50 mL IVPB - Premix  Status:  Discontinued     1 g100 mL/hr over 30 Minutes  Intravenous Every 24 hours 10/08/14 0945 10/08/14 1516   09/30/14 1300  micafungin (MYCAMINE) 100 mg in sodium chloride 0.9 % 100 mL IVPB    Comments:  Pharmacy may adjust as needed   100 mg100 mL/hr over 1 Hours Intravenous Every 24 hours 09/30/14 1213 11/12/14 1259   09/27/14 2100  ceFEPIme (MAXIPIME) 2 g in dextrose 5 % 50 mL IVPB  Status:  Discontinued     2 g100 mL/hr over 30 Minutes Intravenous Every 24 hours 09/27/14 0603 09/27/14 1103   09/27/14 2000  vancomycin (VANCOCIN) IVPB 1000 mg/200 mL premix  Status:  Discontinued     1,000 mg200 mL/hr over 60 Minutes Intravenous Every 24 hours 09/27/14 0603 09/27/14 1103   09/27/14 1400  vancomycin (VANCOCIN) IVPB 1000 mg/200 mL premix  Status:  Discontinued     1,000 mg200 mL/hr over 60 Minutes Intravenous Every 12 hours 09/27/14 1103 10/02/14 1348   09/27/14 1200  ceFEPIme (MAXIPIME) 2 g in dextrose 5 % 50 mL IVPB  Status:  Discontinued     2 g100 mL/hr over 30 Minutes Intravenous Every 12 hours 09/27/14 1103 10/02/14 1348   09/26/14 1930  vancomycin (VANCOCIN) IVPB 1000 mg/200 mL premix     1,000 mg200 mL/hr over 60 Minutes Intravenous  Once 09/26/14 1917 09/26/14 2210   09/26/14 1930  ceFEPIme (MAXIPIME) 2 g in dextrose 5 % 50 mL IVPB     2 g100 mL/hr over 30 Minutes Intravenous  Once 09/26/14 1917 09/26/14 2132       Assessment/Plan  S/P repair perf pre-pyloric ulcer, POD 16 - resume TF and D/C JP, CT of abd was normal on 10/09/14 Vent dependent resp failure - per CCM Fungemia and PNA with sepsis - Micafungin and ABX per CCM S/P thrombectomy LUE and L brachial to radial bypass graft- Lovenox per VVS   LOS: 17 days    Shae Hinnenkamp E 10/13/2014, 8:04 AM Pager: [phone]

## 2014-10-13 NOTE — Progress Notes (Signed)
UR Completed.  [phone]  

## 2014-10-14 ENCOUNTER — Inpatient Hospital Stay (HOSPITAL_COMMUNITY): Payer: BLUE CROSS/BLUE SHIELD

## 2014-10-14 ENCOUNTER — Telehealth: Payer: Self-pay | Admitting: Family Medicine

## 2014-10-14 DIAGNOSIS — J9600 Acute respiratory failure, unspecified whether with hypoxia or hypercapnia: Secondary | ICD-10-CM

## 2014-10-14 LAB — GLUCOSE, CAPILLARY
Glucose-Capillary: 105 mg/dL — ABNORMAL HIGH (ref 70–99)
Glucose-Capillary: 117 mg/dL — ABNORMAL HIGH (ref 70–99)
Glucose-Capillary: 169 mg/dL — ABNORMAL HIGH (ref 70–99)
Glucose-Capillary: 173 mg/dL — ABNORMAL HIGH (ref 70–99)
Glucose-Capillary: 180 mg/dL — ABNORMAL HIGH (ref 70–99)
Glucose-Capillary: 79 mg/dL (ref 70–99)

## 2014-10-14 LAB — CBC
HCT: 24.4 % — ABNORMAL LOW (ref 36.0–46.0)
Hemoglobin: 7.3 g/dL — ABNORMAL LOW (ref 12.0–15.0)
MCH: 31.1 pg (ref 26.0–34.0)
MCHC: 29.9 g/dL — ABNORMAL LOW (ref 30.0–36.0)
MCV: 103.8 fL — ABNORMAL HIGH (ref 78.0–100.0)
Platelets: 486 10*3/uL — ABNORMAL HIGH (ref 150–400)
RBC: 2.35 MIL/uL — ABNORMAL LOW (ref 3.87–5.11)
RDW: 14.9 % (ref 11.5–15.5)
WBC: 15.9 10*3/uL — AB (ref 4.0–10.5)

## 2014-10-14 LAB — CULTURE, BLOOD (ROUTINE X 2): Culture: NO GROWTH

## 2014-10-14 LAB — BASIC METABOLIC PANEL
Anion gap: 8 (ref 5–15)
BUN: 9 mg/dL (ref 6–23)
CO2: 30 mmol/L (ref 19–32)
CREATININE: 0.75 mg/dL (ref 0.50–1.10)
Calcium: 7.7 mg/dL — ABNORMAL LOW (ref 8.4–10.5)
Chloride: 107 mEq/L (ref 96–112)
GFR calc Af Amer: >90 mL/min (ref >90)
GFR calc non Af Amer: >90 mL/min (ref >90)
Glucose, Bld: 111 mg/dL — ABNORMAL HIGH (ref 70–99)
Potassium: 2.6 mmol/L — CL (ref 3.5–5.1)
Sodium: 145 mmol/L (ref 135–145)

## 2014-10-14 LAB — MAGNESIUM: Magnesium: 1.6 mg/dL (ref 1.5–2.5)

## 2014-10-14 MED ORDER — POTASSIUM CHLORIDE 20 MEQ/15ML (10%) PO SOLN
40.0000 meq | ORAL | Status: AC
  Administered 2014-10-14 (×3): 40 meq
  Filled 2014-10-14 (×3): qty 30

## 2014-10-14 NOTE — Procedures (Cosign Needed)
Bedside Tracheostomy Insertion Procedure Note   Patient Details:   Name: [name] DOB: November 28, 1965 MRN: [id number]  Procedure: Tracheostomy  Pre Procedure Assessment: ET Tube Size:7.0 ET Tube secured at lip (cm):22 Bite block in place: No Breath Sounds: Clear  Post Procedure Assessment: BP 124/66 mmHg  Pulse 101  Temp(Src) 101.4 F (38.6 C) (Axillary)  Resp 18  Ht 4\' 9"  (1.448 m)  Wt 163 lb 2.3 oz (74 kg)  BMI 35.29 kg/m2  SpO2 98% O2 sats: stable throughout Complications: No apparent complications Patient did tolerate procedure well Tracheostomy Brand:Shiley Tracheostomy Style:Cuffed Tracheostomy Size: 6.0 Tracheostomy Secured NOM:[id number] , velcro Tracheostomy Placement Confirmation:Trach cuff visualized and in place, cxr taken for placwement6    Fredy Gladu Ann 10/14/2014, 4:19 PM

## 2014-10-14 NOTE — Telephone Encounter (Signed)
In hospital check records, son reports

## 2014-10-14 NOTE — Progress Notes (Signed)
[facility] ADULT ICU REPLACEMENT PROTOCOL FOR AM LAB REPLACEMENT ONLY  The patient does apply for the [facility] Adult ICU Electrolyte Replacment Protocol based on the criteria listed below:   1. Is GFR >/= 40 ml/min? Yes.    Patient's GFR today is >90 2. Is urine output >/= 0.5 ml/kg/hr for the last 6 hours? Yes.   Patient's UOP is .67 ml/kg/hr 3. Is BUN < 60 mg/dL? Yes.    Patient's BUN today is 9 4. Abnormal electrolyte  K 2.6 5. Ordered repletion with: per protocol 6. If a panic level lab has been reported, has the CCM MD in charge been notified? Yes.  .   Physician:  Antoine Primas 10/14/2014 6:37 AM

## 2014-10-14 NOTE — Progress Notes (Signed)
CRITICAL VALUE ALERT  Critical value received:  K 2.6  Date of notification:  10/14/14  Time of notification:  0631  Critical value read back:Yes.    Nurse who received alert:  Vivia Ewing RN  MD notified (1st page):  Elink already aware and placed orders

## 2014-10-14 NOTE — Progress Notes (Signed)
PULMONARY / CRITICAL CARE MEDICINE HISTORY AND PHYSICAL EXAMINATION   Name: LENAH MESSENGER MRN: [id number] DOB: 12-23-1965    ADMISSION DATE:  09/26/2014  PRIMARY SERVICE: PCCM  CHIEF COMPLAINT:  Abd pain  BRIEF PATIENT DESCRIPTION:  62yof smoker with PMH of MS, depression presented 12/19 on transfer from [facility] with abd pain, found to have pneumoperitoneum 2/2 small bowel rupture, also with new cold L hand.  Required surgery for DU perf & left brachial bypass, fungemia noted on admission cultures.  SIGNIFICANT EVENTS / STUDIES:  12/19 Admitted, CT Abd with pneumoperitoneum, cold L hand concerning for arterial clot as well. 12/20 ex-lap [name]) with omental patch for perforated prepyloric gastric ulcer; L brachial and ulnar artery embolectomy [name]) 12/20 LE doppler >>neg 12/20 Left brachial to left radial artery bypass with left leg greater saphenous vein for recurrent occlusion  12/20 Self extubated - had to be reintubated 12/21 echo >>nml LVEF 12/26 CT abdomen >> pelvic fluid 12/28 TEE neg 2023-10-14 reintubated, coded 4 minutes; CT head without acute change 01/01 High dose pressors, CT abdomen> no fluid collection noted; LLL pneumonia 01/03 PSV 4 hours, Hydrocortisone added, following simple commands, pressors improved, oxygenation improved 01/04 off pressors  LINES / TUBES: R IJ CVL 12/20 >> 12/29 LIJ CVL 12/28 >> JP drain RUQ 12/20 >>  ETT 12/19 >> 12/29, 12/31>>  SUBJECTIVE:  For trach later today.  VITAL SIGNS: Temp:  [98.9 F (37.2 C)-100.9 F (38.3 C)] 99.3 F (37.4 C) (01/06 0400) Pulse Rate:  [73-129] 109 (01/06 0800) Resp:  [15-22] 18 (01/06 0800) BP: (89-139)/(46-89) 121/68 mmHg (01/06 0800) SpO2:  [93 %-100 %] 97 % (01/06 0800) FiO2 (%):  [40 %] 40 % (01/06 0800) Weight:  [163 lb 2.3 oz (74 kg)] 163 lb 2.3 oz (74 kg) (01/06 0400) HEMODYNAMICS: CVP:  [6 mmHg-13 mmHg] 7 mmHg  Vent Mode:  [-] PRVC FiO2 (%):  [40 %] 40 % Set Rate:  [18 bmp] 18  bmp Vt Set:  [500 mL] 500 mL PEEP:  [5 cmH20] 5 cmH20 Plateau Pressure:  [13 cmH20-29 cmH20] 22 cmH20 INTAKE / OUTPUT: Intake/Output      01/05 0701 - 01/06 0700 01/06 0701 - 01/07 0700   I.V. (mL/kg) 1355 (18.3) 40 (0.5)   NG/GT 1220    IV Piggyback 550    Total Intake(mL/kg) 3125 (42.2) 40 (0.5)   Urine (mL/kg/hr) 1260 (0.7)    Stool 250 (0.1)    Total Output 1510     Net +1615 +40          PHYSICAL EXAMINATION:  Gen: no distress HEENT: ETT in place PULM: no wheeze CV: regular, tachycardic AB: wound dressing clean Derm: no rashes Ext: 1+ edema Neuro: RASS -1  LABS: CBC Recent Labs     10/12/14  0415  10/13/14  0345  10/14/14  0500  WBC  20.0*  22.8*  15.9*  HGB  8.0*  8.9*  7.3*  HCT  26.5*  28.7*  24.4*  PLT  486*  609*  486*    BMET Recent Labs     10/12/14  0415  10/13/14  0345  10/14/14  0500  NA  144  145  145  K  3.9  3.1*  2.6*  CL  111  108  107  CO2  29  31  30   BUN  18  13  9   CREATININE  0.74  0.68  0.75  GLUCOSE  221*  208*  111*  Electrolytes Recent Labs     10/12/14  0415  10/13/14  0345  10/14/14  0500  CALCIUM  7.9*  7.8*  7.7*  MG  2.1   --    --     Sepsis Markers No results for input(s): PROCALCITON, O2SATVEN in the last 72 hours.  Invalid input(s): LACTICACIDVEN  ABG No results for input(s): PHART, PCO2ART, PO2ART in the last 72 hours.  Liver Enzymes No results for input(s): AST, ALT, ALKPHOS, BILITOT, ALBUMIN in the last 72 hours.  Cardiac Enzymes No results for input(s): TROPONINI, PROBNP in the last 72 hours.  Glucose Recent Labs     10/12/14  1931  10/12/14  2322  10/13/14  0356  10/13/14  1929  10/13/14  2332  10/14/14  0404  GLUCAP  110*  176*  183*  126*  89  117*    Imaging Dg Chest Port 1 View  10/14/2014   CLINICAL DATA:  Pneumonia.  EXAM: PORTABLE CHEST - 1 VIEW  COMPARISON:  10/13/2014.  FINDINGS: Endotracheal tube, left IJ line, NG tube in good anatomic position. Heart size is stable  however there is progressive bilateral interstitial prominence and small bilateral pleural effusions suggesting congestive heart failure. Pneumonitis cannot be excluded. No pneumothorax. Prior cervical spine fusion .  IMPRESSION: 1. Lines and tubes in stable position . 2. Congestive heart failure with bilateral pulmonary interstitial edema and small pleural effusions. Associated bowel pneumonitis cannot be excluded.   Electronically Signed   By: Marcello Moores  Register   On: 10/14/2014 08:06   Dg Chest Port 1 View  10/13/2014   CLINICAL DATA:  Healthcare associated pneumonia  EXAM: PORTABLE CHEST - 1 VIEW  COMPARISON:  10/12/2014  FINDINGS: Cardiomediastinal silhouette is stable. Central mild vascular congestion without convincing pulmonary edema. Stable endotracheal tube position. Stable left IJ central line position and NG tube position. Persistent bilateral basilar atelectasis or infiltrate. Probable small left pleural effusion.  IMPRESSION: Stable support apparatus. Central mild vascular congestion without convincing pulmonary edema. Persistent bilateral basilar atelectasis or infiltrate. Probable small left pleural effusion.   Electronically Signed   By: Lahoma Crocker M.D.   On: 10/13/2014 08:12    ASSESSMENT / PLAN:  PULMONARY ETT 12/19 >> 12/29, 12/31 >> A:  Acute respiratory failure with hypoxemia > due to MRSA HCAP. Difficult airway 12/31 due to cord edema. Hx of COPD. Prior hx of tracheostomy. P:   Pressure support wean as tolerated For trach today F/u CXR PRN BD's  CARDIOVASCULAR A: Septic Shock 1/1 2nd to HCAP >> off pressors. Demand ischemia 1/1. Lt brachial/radial/ulnar artery thrombectomy. P:   Monitor hemodyamics Goal CVP 8 to 12 Post-op care per VVS Full dose lovenox >> will need to transition to oral anticoagulant when more stable >> hold lovenox prior to trach Continue ASA  RENAL A: AKI 2nd to sepsis >> resolved. P:   Monitor renal fx, urine outpt, electrolytes    GASTROINTESTINAL A: Perforated gastric ulcer and pneumoperitoneum s/p laparotomy. Hx of GERD. P:   Post-op care per CCS Protonix for SUP Continue tube feeds  HEMATOLOGIC A: Anemia of critical illness. P:   F/u CBC Lovenox for DVT prevention  INFECTIOUS A: Peritonitis after bowel perforation 12/20 > Candida glabrata peritonitis and fungemia. Septic shock 1/1 > due to MRSA HCAP, procalcitonin 38.5. P:   Day 15 of micafungin for peritonitis, fungemia Day 7 of Abx for HCAP >> Day 6 of vancomycin, day 3 fortaz   Blood 12/19 >> Candida glabrata Peritoneal  fluid 12/20 >> Candida glabrata Sputum 12/31 >> Pseudomonas aeruginosa, MRSA  ENDOCRINE A:  Hyperglycemia. Relative adrenal insufficiency. P:   SSI with Lantus D/c solu cortef 1/06   NEUROLOGIC A: Post op pain. Hx of multiple sclerosis, fibromyalgia, peripheral neuropathy, Guillian Barre syndrome. Acute encephalopathy after cardiac arrest 1/1 > worrisome for anoxic injury but making slow improvement 1/3. P:   RASS goal -1 Fentanyl, versed F/u EEG 1/02 >> Hold outpt celexa, klonopin, neurontin, flexeril >> will start to slowly resume after trach   CC time 35 minutes.  [name], MD [facility] Pulmonary/Critical Care 10/14/2014, 8:32 AM Pager:  [phone] After 3pm call: [phone]

## 2014-10-14 NOTE — Progress Notes (Addendum)
   [facility]  Subjective  - Opens eyes to voice.   Objective 125/75 101 99.3 F (37.4 C) (Oral) 18 100%  Intake/Output Summary (Last 24 hours) at 10/14/14 0740 Last data filed at 10/14/14 0600  Gross per 24 hour  Intake   3125 ml  Output   1510 ml  Net   1615 ml    Palpable radial and DP bilateral extremities. Incisions healing well without signs of infection Lungs intubated Heart sinus tach 102 bpm  Assessment/Planning: 49 y.o. female is s/p:  #1: Thrombectomy of left brachial, radial, and ulnar arteries #2: Patch angioplasty of left brachial and ulnar artery #3: Patch angioplasty left radial artery And take back to OR later that day for:  1. Redo Left brachial, ulnar, and radial exposure 2. Thrombectomy Left brachial, ulnar, adn radial artery 3. Angiogram, left upper extremity 4. Left brachial to left radial artery bypass with left leg non-reversed greater saphenous vein 5. Intra-arterial injection of TPA 17 Days Post-Op  Intubated with plans for tracheostomy later this week WBC decreased HCAP/COPD hx Day 6 of vancomycin, day 3 fortaz  Lovenox DVT     Theda Sers [facility] 10/14/2014 7:40 AM --  Laboratory Lab Results:  Recent Labs  10/13/14 0345 10/14/14 0500  WBC 22.8* 15.9*  HGB 8.9* 7.3*  HCT 28.7* 24.4*  PLT 609* 486*   BMET  Recent Labs  10/13/14 0345 10/14/14 0500  NA 145 145  K 3.1* 2.6*  CL 108 107  CO2 31 30  GLUCOSE 208* 111*  BUN 13 9  CREATININE 0.68 0.75  CALCIUM 7.8* 7.7*    COAG Lab Results  Component Value Date   INR 1.17 10/13/2014   INR 1.25 10/05/2014   No results found for: PTT    I agree with the above.  No acute vascular issues.  She has a palpable radial artery bypass graft on the  left and wrist Doppler signals on the right.  We'll need to monitor progress of the right fourth digit.  [name]

## 2014-10-14 NOTE — Procedures (Signed)
Bronchoscopy Procedure Note [name] [id number] 14-Sep-1966  Procedure: Bronchoscopy assistance for percutaneous tracheostomy insertion Indications: Acute respiratory failure with failure to wean from ventilator  Procedure Details Consent: Risks of procedure as well as the alternatives and risks of each were explained to the (patient/caregiver).  Consent for procedure obtained. Time Out: Verified patient identification, verified procedure, site/side was marked, verified correct patient position, special equipment/implants available, medications/allergies/relevent history reviewed, required imaging and test results available.  Performed  Please see note from Dr. Titus Mould for information on tracheostomy insertion.  Procedure was done in ICU.  She was placed on respiratory rate 24 and FiO2 100%.  Bronchoscope entered through ETT.  Carina visualized and airways inspected >> no endobronchial lesions, and mucosa normal.  With direct visualization, the ETT was withdrawn to 17 cm at lip.  Visualized finder needle, then guidewire entering trachea w/o evidence for injury to posterior wall.  Then visualized dilator and then 6Fr tracheostomy entering trachea w/o evidence for injury to posterior wall.  Bronchoscope was then withdrawn from ETT and entered into tracheostomy which was visualized in good position above the carina.  No significant bleeding.  Bronchoscope was then withdrawn.  Hemodynamics and oxygenation stable during procedure.  Post-procedure CXR pending.  Tammy Mires, MD [facility] Pulmonary/Critical Care 10/14/2014, 4:10 PM Pager:  [phone] After 3pm call: [phone]

## 2014-10-14 NOTE — Procedures (Signed)
Name:  [name] MRN:  [id number] DOB:  [birth year]  OPERATIVE NOTE  Procedure:  Percutaneous tracheostomy - re do  Indications:  Ventilator-dependent respiratory failure.  Consent:  Procedure, alternatives, risks and benefits discussed with medical POA.  Questions answered.  Consent obtained.  Anesthesia: prop, versed, fent, vec  Procedure summary:  Appropriate equipment was assembled.  The patient was identified as [name] and safety timeout was performed. The patient was placed in supine position with a towel roll behind shoulder blades and neck extended.  Sterile technique was used. The patient's neck and upper chest were prepped using chlorhexidine / alcohol scrub and the field was draped in usual sterile fashion with full body drape. After the adequate sedation / anesthesia was achieved, attention was directed at the midline trachea, where the cricothyroid membrane was palpated. Approximately two fingerbreadths above the sternal notch, a verticle incision was created with a scalpel after local infiltration with 0.2% Lidocaine through the old trach scar.. Then, using Seldinger technique and a percutaneous tracheostomy set, the trachea was entered with a 14 gauge needle with an overlying sheath. This was all confirmed under direct visualization of a fiberoptic flexible bronchoscope. Entrance into the trachea was identified through the third tracheal ring interspace. Following this, a guidewire was inserted. The needle was removed, leaving the sheath and the guidewire intact. Next, the sheath was removed and a small dilator was inserted. The tracheal rings were then dilated. A #6 Shiley was then opened. The balloon was checked. It was placed over a tracheal dilator, which was then advanced over the guidewire and through the previously dilated tract. The Shiley tracheostomy tube was noted to pass in the trachea with little resistance as by direct visualization noted to pass directly through  old trach membrane. The guidewire and dilator tubes were removed from the trachea. An inner cannula was placed through the tracheostomy tube. The tracheostomy was then secured at the anterior neck with 4 monofilament sutures. The oral endotracheal tube was removed and the ventilator was attached to the newly placed tracheostomy tube. Adequate tidal volumes were noted. The cuff was inflated and no evidence of air leak was noted. No evidence of bleeding was noted. At this point, the procedure was concluded. Post-procedure chest x-ray was ordered.  Complications:  No immediate complications were noted.  Hemodynamic parameters and oxygenation remained stable throughout the procedure.  Estimated blood loss:  Less then 5 mL.  Raylene Miyamoto., MD Pulmonary and [place] Pager: [phone]  10/14/2014, 3:58 PM   Can follow in trach clinic [phone]

## 2014-10-14 NOTE — Progress Notes (Signed)
RT note- Pulmonary team at the bedside for tracheostomy.

## 2014-10-15 ENCOUNTER — Inpatient Hospital Stay (HOSPITAL_COMMUNITY): Payer: BLUE CROSS/BLUE SHIELD

## 2014-10-15 LAB — GLUCOSE, CAPILLARY
GLUCOSE-CAPILLARY: 195 mg/dL — AB (ref 70–99)
GLUCOSE-CAPILLARY: 155 mg/dL — AB (ref 70–99)
GLUCOSE-CAPILLARY: 221 mg/dL — AB (ref 70–99)
Glucose-Capillary: 121 mg/dL — ABNORMAL HIGH (ref 70–99)
Glucose-Capillary: 136 mg/dL — ABNORMAL HIGH (ref 70–99)
Glucose-Capillary: 143 mg/dL — ABNORMAL HIGH (ref 70–99)
Glucose-Capillary: 147 mg/dL — ABNORMAL HIGH (ref 70–99)
Glucose-Capillary: 156 mg/dL — ABNORMAL HIGH (ref 70–99)
Glucose-Capillary: 250 mg/dL — ABNORMAL HIGH (ref 70–99)

## 2014-10-15 LAB — CBC
HCT: 23.7 % — ABNORMAL LOW (ref 36.0–46.0)
HEMOGLOBIN: 7.2 g/dL — AB (ref 12.0–15.0)
MCH: 31.2 pg (ref 26.0–34.0)
MCHC: 30.4 g/dL (ref 30.0–36.0)
MCV: 102.6 fL — AB (ref 78.0–100.0)
Platelets: 416 10*3/uL — ABNORMAL HIGH (ref 150–400)
RBC: 2.31 MIL/uL — ABNORMAL LOW (ref 3.87–5.11)
RDW: 15.9 % — AB (ref 11.5–15.5)
WBC: 13 10*3/uL — ABNORMAL HIGH (ref 4.0–10.5)

## 2014-10-15 LAB — BASIC METABOLIC PANEL
Anion gap: 6 (ref 5–15)
BUN: 8 mg/dL (ref 6–23)
CO2: 32 mmol/L (ref 19–32)
Calcium: 7.6 mg/dL — ABNORMAL LOW (ref 8.4–10.5)
Chloride: 107 mEq/L (ref 96–112)
Creatinine, Ser: 0.64 mg/dL (ref 0.50–1.10)
GFR calc Af Amer: >90 mL/min (ref >90)
Glucose, Bld: 151 mg/dL — ABNORMAL HIGH (ref 70–99)
Potassium: 3 mmol/L — ABNORMAL LOW (ref 3.5–5.1)
Sodium: 145 mmol/L (ref 135–145)

## 2014-10-15 LAB — VANCOMYCIN, TROUGH: Vancomycin Tr: 19.4 ug/mL (ref 10.0–20.0)

## 2014-10-15 MED ORDER — POTASSIUM CHLORIDE CRYS ER 20 MEQ PO TBCR: 40.0000 meq | EXTENDED_RELEASE_TABLET | Freq: Two times a day (BID) | ORAL | Status: DC

## 2014-10-15 MED ORDER — MAGNESIUM SULFATE 2 GM/50ML IV SOLN
2.0000 g | Freq: Once | INTRAVENOUS | Status: AC
  Administered 2014-10-15: 2 g via INTRAVENOUS
  Filled 2014-10-15: qty 50

## 2014-10-15 MED ORDER — FENTANYL CITRATE 0.05 MG/ML IJ SOLN
25.0000 ug | INTRAMUSCULAR | Status: DC | PRN
  Administered 2014-10-15 – 2014-10-18 (×16): 100 ug via INTRAVENOUS
  Filled 2014-10-15 (×16): qty 2

## 2014-10-15 MED ORDER — ENOXAPARIN SODIUM 80 MG/0.8ML ~~LOC~~ SOLN
1.0000 mg/kg | Freq: Two times a day (BID) | SUBCUTANEOUS | Status: DC
  Administered 2014-10-15 – 2014-10-18 (×6): 75 mg via SUBCUTANEOUS
  Filled 2014-10-15 (×8): qty 0.8

## 2014-10-15 MED ORDER — MIDAZOLAM HCL 2 MG/2ML IJ SOLN
0.5000 mg | INTRAMUSCULAR | Status: DC | PRN
  Administered 2014-10-17 – 2014-10-18 (×3): 1 mg via INTRAVENOUS
  Filled 2014-10-15 (×3): qty 2

## 2014-10-15 MED ORDER — POTASSIUM CHLORIDE 20 MEQ/15ML (10%) PO SOLN
40.0000 meq | Freq: Two times a day (BID) | ORAL | Status: AC
  Administered 2014-10-15 – 2014-10-16 (×3): 40 meq
  Filled 2014-10-15 (×4): qty 30

## 2014-10-15 MED ORDER — HALOPERIDOL LACTATE 5 MG/ML IJ SOLN
1.0000 mg | INTRAMUSCULAR | Status: DC | PRN
  Administered 2014-10-15 – 2014-10-16 (×2): 4 mg via INTRAVENOUS
  Filled 2014-10-15 (×2): qty 1

## 2014-10-15 NOTE — Trach Care Team (Signed)
Trach Care Progression Note   Patient Details Name: [name] MRN: [id number] DOB: 10-04-1966 Today's Date: 10/15/2014   Tracheostomy Assessment    Tracheostomy Shiley 6 mm Cuffed (Active)  Status Secured 10/15/2014 12:02 PM  Site Assessment Dry 10/15/2014 11:52 AM  Site Care Cleansed;Dried;Open to air 10/15/2014 10:30 AM  Inner Cannula Care Changed/new 10/14/2014  3:57 PM  Ties Assessment Secure 10/15/2014 12:02 PM  Cuff pressure (cm) 30 cm 10/14/2014  3:57 PM  Emergency Equipment at bedside Yes 10/15/2014 12:02 PM     Care Needs Suture removal post-op day #7 : 10/21/14   Respiratory Therapy O2 Device: Ventilator FiO2 (%): 40 % SpO2: 94 % Education:  (none`) Follow up recommendations:  (will follow for pt progress) Respiratory barriers to progression:  (pt remains on full vent support )    Speech Language Pathology  SLP chart review complete: Patient not ready for SLP services   Physical Therapy PT Recommendation/Assessment: Patient needs continued PT services Follow Up Recommendations: SNF, Supervision/Assistance - 24 hour PT equipment: None recommended by PT    Occupational Therapy      Nutritional Patient's Current Diet: Tube feeding Tube Feeding: Vital AF 1.2 Cal Tube Feeding Frequency: Continuous Tube Feeding Strength: Full strength Diet Recommendations: NPO, Alternative means - temporary    Case Management/Social Work English as a second language teacher: Medicare Barriers to progression: clinical Anticipated discharge disposition: LTACH    Scientist, research (life sciences) Care Team Members Present-  Ciro Backer, RT, Alvino Blood, SW, Molli Barrows, RD, Herbie [place], SLP, Deveron Furlong, [place], NP New trach - Continue to Follow.          [name], [name] (scribe for team) 10/15/2014, 1:40 PM

## 2014-10-15 NOTE — Significant Event (Addendum)
Wasted approximately 20cc of versed from bag and approximately 12cc of fentanyl from bag and wasted in sink and flushed. Witnessed of these sedation medications with Delrae Sawyers, RN.

## 2014-10-15 NOTE — Progress Notes (Signed)
[facility] ADULT ICU REPLACEMENT PROTOCOL FOR AM LAB REPLACEMENT ONLY  The patient does apply for the [facility] Adult ICU Electrolyte Replacment Protocol based on the criteria listed below:   1. Is GFR >/= 40 ml/min? Yes.    Patient's GFR today is >90 2. Is urine output >/= 0.5 ml/kg/hr for the last 6 hours? Yes.   Patient's UOP is .99 ml/kg/hr 3. Is BUN < 60 mg/dL? Yes.    Patient's BUN today is 8 4. Abnormal electrolyte  K 3 5. Ordered repletion with:per protocol 6. If a panic level lab has been reported, has the CCM MD in charge been notified? Yes.  .   Physician:  Loney Laurence 10/15/2014 6:17 AM

## 2014-10-15 NOTE — Progress Notes (Signed)
Versed drip- 20 cc wasted in sink with Emerson Monte, RN.

## 2014-10-15 NOTE — Progress Notes (Signed)
PULMONARY / CRITICAL CARE MEDICINE HISTORY AND PHYSICAL EXAMINATION   Name: [name] MRN: [id number] DOB: 05/06/1966    ADMISSION DATE:  09/26/2014  PRIMARY SERVICE: PCCM  CHIEF COMPLAINT:  Abd pain  BRIEF PATIENT DESCRIPTION:  49yof smoker with PMH of MS, depression presented 12/19 on transfer from [facility] with abd pain, found to have pneumoperitoneum 2/2 small bowel rupture, also with new cold L hand.  Required surgery for DU perf & left brachial bypass, fungemia noted on admission cultures.  SIGNIFICANT EVENTS / STUDIES:  12/19 Admitted, CT Abd with pneumoperitoneum, cold L hand concerning for arterial clot as well. 12/20 ex-lap [name]) with omental patch for perforated prepyloric gastric ulcer; L brachial and ulnar artery embolectomy [name]) 12/20 LE doppler >>neg 12/20 Left brachial to left radial artery bypass with left leg greater saphenous vein for recurrent occlusion  12/20 Self extubated - had to be reintubated 12/21 echo >>nml LVEF 12/26 CT abdomen >> pelvic fluid 12/28 TEE neg 2023-11-01 reintubated, coded 4 minutes; CT head without acute change 01/01 High dose pressors, CT abdomen> no fluid collection noted; LLL pneumonia 01/03 PSV 4 hours, Hydrocortisone added, following simple commands, pressors improved, oxygenation improved 01/04 off pressors 1/6 Trach  LINES / TUBES: R IJ CVL 12/20 >> 12/29 LIJ CVL 12/28 >> JP drain RUQ 12/20 >>  ETT 12/19 >> 12/29, 12/31>>  SUBJECTIVE:  Trach yesterday Following commands today Off pressors Weaning on PSV  VITAL SIGNS: Temp:  [98.6 F (37 C)-101.4 F (38.6 C)] 100.3 F (37.9 C) (01/07 0848) Pulse Rate:  [84-129] 111 (01/07 0800) Resp:  [18-26] 18 (01/07 0800) BP: (90-151)/(51-99) 115/74 mmHg (01/07 0849) SpO2:  [90 %-100 %] 92 % (01/07 0800) FiO2 (%):  [40 %-50 %] 40 % (01/07 0849) Weight:  [76.476 kg (168 lb 9.6 oz)] 76.476 kg (168 lb 9.6 oz) (01/07 0418) HEMODYNAMICS: CVP:  [6 mmHg-21 mmHg] 9  mmHg  Vent Mode:  [-] CPAP;PSV FiO2 (%):  [40 %-50 %] 40 % Set Rate:  [18 bmp] 18 bmp Vt Set:  [500 mL] 500 mL PEEP:  [5 cmH20] 5 cmH20 Pressure Support:  [12 cmH20] 12 cmH20 Plateau Pressure:  [18 cmH20] 18 cmH20 INTAKE / OUTPUT: Intake/Output      01/06 0701 - 01/07 0700 01/07 0701 - 01/08 0700   I.V. (mL/kg) 1092 (14.3) 20 (0.3)   NG/GT     IV Piggyback 400    Total Intake(mL/kg) 1492 (19.5) 20 (0.3)   Urine (mL/kg/hr) 2265 (1.2) 125 (0.8)   Stool 100 (0.1)    Total Output 2365 125   Net -873 -105        Stool Occurrence 1 x      PHYSICAL EXAMINATION:  Gen; awake on vent HEENT: NCAT PULM: CTA B CV: Tachy, no mgr Ab: BS+, midline scar well dressed EXT: warm, trace edema Neuro: awake, follows simple commands, answers orientation questions with nods appropriately  LABS: CBC Recent Labs     10/13/14  0345  10/14/14  0500  10/15/14  0500  WBC  22.8*  15.9*  13.0*  HGB  8.9*  7.3*  7.2*  HCT  28.7*  24.4*  23.7*  PLT  609*  486*  416*    BMET Recent Labs     10/13/14  0345  10/14/14  0500  10/15/14  0500  NA  145  145  145  K  3.1*  2.6*  3.0*  CL  108  107  107  CO2  31  30  32  BUN  13  9  8   CREATININE  0.68  0.75  0.64  GLUCOSE  208*  111*  151*    Electrolytes Recent Labs     10/13/14  0345  10/14/14  0500  10/14/14  1800  10/15/14  0500  CALCIUM  7.8*  7.7*   --   7.6*  MG   --    --   1.6   --     Sepsis Markers No results for input(s): PROCALCITON, O2SATVEN in the last 72 hours.  Invalid input(s): LACTICACIDVEN  ABG No results for input(s): PHART, PCO2ART, PO2ART in the last 72 hours.  Liver Enzymes No results for input(s): AST, ALT, ALKPHOS, BILITOT, ALBUMIN in the last 72 hours.  Cardiac Enzymes No results for input(s): TROPONINI, PROBNP in the last 72 hours.  Glucose Recent Labs     10/14/14  1233  10/14/14  1623  10/14/14  1933  10/14/14  2345  10/15/14  0343  10/15/14  0836  GLUCAP  169*  180*  173*  79  143*   147*    Imaging Dg Chest Port 1 View  10/15/2014   CLINICAL DATA:  49 year old female currently admitted with a diagnosis of left lower lobe pneumonia  EXAM: PORTABLE CHEST - 1 VIEW  COMPARISON:  Prior chest x-ray 10/14/2014  FINDINGS: Tracheostomy tube tip remains midline and at the level of the clavicles. There is a left IJ approach central venous catheter with tip well positioned in the mid SVC. The nasogastric tube is incompletely imaged. The tip lies off the field of view, presumably within the stomach. Incompletely imaged surgical changes of ACDF at C6-C7.  Persistent left lower lobe airspace opacity and superimposed volume loss. Slightly increased right basilar opacity. No pulmonary edema. No pneumothorax. No acute osseous abnormality. Mild pulmonary vascular congestion has improved compared to prior.  IMPRESSION: 1. Unchanged appearance of the left lower lobe infiltrate and/or atelectasis. 2. Slightly increased right basilar opacity favored to reflect atelectasis. 3. Slight interval decrease in pulmonary vascular congestion compared to the prior chest x-ray. 4. Stable and satisfactory support apparatus.   Electronically Signed   By: Jacqulynn Cadet M.D.   On: 10/15/2014 07:49   Dg Chest Port 1 View  10/14/2014   CLINICAL DATA:  New tracheostomy  EXAM: PORTABLE CHEST - 1 VIEW  COMPARISON:  Portable exam 1701 hr compared to 0643 hr  FINDINGS: New tracheostomy tube tip projecting over tracheal air column.  Nasogastric tube extends into stomach.  LEFT jugular central venous catheter tip projects over SVC.  Normal heart size with slight pulmonary vascular congestion.  Atherosclerotic calcification aorta.  Mild RIGHT basilar atelectasis.  Probable small LEFT pleural effusion with atelectasis versus consolidation at LEFT lung base.  No pneumothorax.  IMPRESSION: No pneumothorax following tracheostomy placement.  Otherwise little changed.   Electronically Signed   By: Lavonia Dana M.D.   On: 10/14/2014 17:27    Dg Chest Port 1 View  10/14/2014   CLINICAL DATA:  Pneumonia.  EXAM: PORTABLE CHEST - 1 VIEW  COMPARISON:  10/13/2014.  FINDINGS: Endotracheal tube, left IJ line, NG tube in good anatomic position. Heart size is stable however there is progressive bilateral interstitial prominence and small bilateral pleural effusions suggesting congestive heart failure. Pneumonitis cannot be excluded. No pneumothorax. Prior cervical spine fusion .  IMPRESSION: 1. Lines and tubes in stable position . 2. Congestive heart failure with bilateral pulmonary interstitial edema and small  pleural effusions. Associated bowel pneumonitis cannot be excluded.   Electronically Signed   By: Marcello Moores  Register   On: 10/14/2014 08:06    ASSESSMENT / PLAN:  PULMONARY ETT 12/19 >> 12/29, 12/31 >> A:  Acute respiratory failure with hypoxemia > due to MRSA and Pseudomonas HCAP Difficult airway 12/31 due to cord edema Hx of COPD Tracheostomy 1/6 > tolerated well P:   Pressure support wean as tolerated, TCT 1/8? Trach care F/u CXR PRN BD's  CARDIOVASCULAR A: Septic Shock 1/1 2nd to HCAP >> resolved Demand ischemia 1/1 Lt brachial/radial/ulnar artery thrombectomy P:   Monitor hemodyamics Goal CVP 8 to 12 Post-op care per [facility] Full dose lovenox >> resume 1/7 Continue ASA  RENAL A: AKI 2nd to sepsis >> resolved Hypokalemia P:   Monitor BMET and UOP Replace electrolytes as needed   GASTROINTESTINAL A: Perforated gastric ulcer and pneumoperitoneum s/p laparotomy Hx of GERD P:   Post-op care per CCS Pantoprazole for stress ulcer prophylaxis Continue tube feeds  HEMATOLOGIC A: Anemia of critical illness P:   F/u CBC Lovenox for DVT prevention  INFECTIOUS A: Peritonitis after bowel perforation 12/20 > Candida glabrata peritonitis and fungemia Septic shock 1/1 > due to MRSA and pseudomonas HCAP, procalcitonin 38.5 P:  Day 16 of micafungin for peritonitis, fungemia, plan 6 weeks per ID Day 8 of Abx for  HCAP >> Day 7 of vancomycin, day 4 fortaz, plan 14 days per ID  Blood 12/19 >> Candida glabrata Peritoneal fluid 12/20 >> Candida glabrata Sputum 12/31 >> Pseudomonas aeruginosa, MRSA  ENDOCRINE A:  Hyperglycemia Relative adrenal insufficiency, stress dose steroids held 1/6 P:   SSI with Lantus   NEUROLOGIC A: Post op pain Hx of multiple sclerosis, fibromyalgia, peripheral neuropathy, Guillian Barre syndrome Acute encephalopathy after cardiac arrest 1/1 > improving as no delirium on exam 1/7 AM P:   RASS goal 0 Fentanyl, versed > change to prn Haldol prn agigation> check EKG for QTc F/U EEG 1/2 >> Hold outpt celexa, klonopin, neurontin, flexeril >> will start to slowly resume after trach   CC time 35 minutes.  Roselie Awkward, MD [place] P[facility]M Pager: [phone] Cell: [phone] If no response, call [phone]

## 2014-10-15 NOTE — Telephone Encounter (Signed)
The son related to [place] that his mother was in the ICU. This is something we have been following. Certainly our hopes are for the patient to improve. This was expressed to the son.

## 2014-10-15 NOTE — Plan of Care (Signed)
Problem: Phase II Progression Outcomes Goal: Pain controlled Outcome: Progressing Pt continues on fentanyl drip Goal: Tolerating diet Outcome: Progressing Tolerating tubefeeds

## 2014-10-15 NOTE — Progress Notes (Signed)
[facility] Progress Note Subjective: Trach on vent. Opens eyes to voice and smiles. Tolerating tube feeding per RN notes.  Objective: Vital signs in last 24 hours: Febrile over night. Temp:  [98.6 F (37 C)-101.4 F (38.6 C)] 100.2 F (37.9 C) (01/07 0353) Pulse Rate:  [84-129] 94 (01/07 0700) Resp:  [18-26] 18 (01/07 0700) BP: (90-151)/(51-99) 99/60 mmHg (01/07 0700) SpO2:  [90 %-100 %] 95 % (01/07 0700) FiO2 (%):  [40 %-50 %] 40 % (01/07 0745) Weight:  [76.476 kg (168 lb 9.6 oz)] 76.476 kg (168 lb 9.6 oz) (01/07 0418) Last BM Date: 10/13/14 (flexiseal)  Intake/Output from previous day: 01/06 0701 - 01/07 0700 In: 1492 [I.V.:1092; IV Piggyback:400] Out: 2365 [Urine:2265; Stool:100] Intake/Output this shift:    General appearance: alert and no distress Resp: clear to auscultation bilaterally and with diminished BS at bases Cardio: regular rate and rhythm GI: soft, hypoactive BS, no distension Incision/Wound: open midline incision with wet to dry saline dressing/apcking in place. Dressing changed at 300am. Wound clean and healing  Lab Results:   Recent Labs  10/14/14 0500 10/15/14 0500  WBC 15.9* 13.0*  HGB 7.3* 7.2*  HCT 24.4* 23.7*  PLT 486* 416*   BMET  Recent Labs  10/14/14 0500 10/15/14 0500  NA 145 145  K 2.6* 3.0*  CL 107 107  CO2 30 32  GLUCOSE 111* 151*  BUN 9 8  CREATININE 0.75 0.64  CALCIUM 7.7* 7.6*   PT/INR  Recent Labs  10/13/14 1605  LABPROT 15.1  INR 1.17   ABG No results for input(s): PHART, HCO3 in the last 72 hours.  Invalid input(s): PCO2, PO2  Studies/Results: Dg Chest Port 1 View  10/15/2014   CLINICAL DATA:  49 year old female currently admitted with a diagnosis of left lower lobe pneumonia  EXAM: PORTABLE CHEST - 1 VIEW  COMPARISON:  Prior chest x-ray 10/14/2014  FINDINGS: Tracheostomy tube tip remains midline and at the level of the clavicles. There is a left IJ approach central venous  catheter with tip well positioned in the mid SVC. The nasogastric tube is incompletely imaged. The tip lies off the field of view, presumably within the stomach. Incompletely imaged surgical changes of ACDF at C6-C7.  Persistent left lower lobe airspace opacity and superimposed volume loss. Slightly increased right basilar opacity. No pulmonary edema. No pneumothorax. No acute osseous abnormality. Mild pulmonary vascular congestion has improved compared to prior.  IMPRESSION: 1. Unchanged appearance of the left lower lobe infiltrate and/or atelectasis. 2. Slightly increased right basilar opacity favored to reflect atelectasis. 3. Slight interval decrease in pulmonary vascular congestion compared to the prior chest x-ray. 4. Stable and satisfactory support apparatus.   Electronically Signed   By: Jacqulynn Cadet M.D.   On: 10/15/2014 07:49   Dg Chest Port 1 View  10/14/2014   CLINICAL DATA:  New tracheostomy  EXAM: PORTABLE CHEST - 1 VIEW  COMPARISON:  Portable exam 1701 hr compared to 0643 hr  FINDINGS: New tracheostomy tube tip projecting over tracheal air column.  Nasogastric tube extends into stomach.  LEFT jugular central venous catheter tip projects over SVC.  Normal heart size with slight pulmonary vascular congestion.  Atherosclerotic calcification aorta.  Mild RIGHT basilar atelectasis.  Probable small LEFT pleural effusion with atelectasis versus consolidation at LEFT lung base.  No pneumothorax.  IMPRESSION: No pneumothorax following tracheostomy placement.  Otherwise little changed.   Electronically Signed   By: Lavonia Dana M.D.   On:  10/14/2014 17:27   Dg Chest Port 1 View  10/14/2014   CLINICAL DATA:  Pneumonia.  EXAM: PORTABLE CHEST - 1 VIEW  COMPARISON:  10/13/2014.  FINDINGS: Endotracheal tube, left IJ line, NG tube in good anatomic position. Heart size is stable however there is progressive bilateral interstitial prominence and small bilateral pleural effusions suggesting congestive heart  failure. Pneumonitis cannot be excluded. No pneumothorax. Prior cervical spine fusion .  IMPRESSION: 1. Lines and tubes in stable position . 2. Congestive heart failure with bilateral pulmonary interstitial edema and small pleural effusions. Associated bowel pneumonitis cannot be excluded.   Electronically Signed   By: Marcello Moores  Register   On: 10/14/2014 08:06    Anti-infectives: Anti-infectives    Start     Dose/Rate Route Frequency Ordered Stop   10/12/14 1800  vancomycin (VANCOCIN) IVPB 750 mg/150 ml premix     750 mg150 mL/hr over 60 Minutes Intravenous Every 12 hours 10/12/14 1518     10/12/14 1400  cefTAZidime (FORTAZ) 1 g in dextrose 5 % 50 mL IVPB     1 g100 mL/hr over 30 Minutes Intravenous 3 times per day 10/12/14 1058 10/16/14 0559   10/09/14 1400  vancomycin (VANCOCIN) IVPB 1000 mg/200 mL premix  Status:  Discontinued     1,000 mg200 mL/hr over 60 Minutes Intravenous Every 12 hours 10/09/14 1303 10/12/14 1518   10/09/14 1400  meropenem (MERREM) 1 g in sodium chloride 0.9 % 100 mL IVPB  Status:  Discontinued     1 g200 mL/hr over 30 Minutes Intravenous 3 times per day 10/09/14 1303 10/11/14 1211   10/09/14 1200  cefTRIAXone (ROCEPHIN) 1 g in dextrose 5 % 50 mL IVPB - Premix  Status:  Discontinued     1 g100 mL/hr over 30 Minutes Intravenous Every 24 hours 10/08/14 1519 10/09/14 1255   10/08/14 2200  metroNIDAZOLE (FLAGYL) IVPB 500 mg  Status:  Discontinued     500 mg100 mL/hr over 60 Minutes Intravenous 3 times per day 10/08/14 1520 10/11/14 1211   10/08/14 1400  metroNIDAZOLE (FLAGYL) IVPB 500 mg  Status:  Discontinued     500 mg100 mL/hr over 60 Minutes Intravenous 3 times per day 10/08/14 0945 10/08/14 1516   10/08/14 1000  cefTRIAXone (ROCEPHIN) 1 g in dextrose 5 % 50 mL IVPB - Premix  Status:  Discontinued     1 g100 mL/hr over 30 Minutes Intravenous Every 24 hours 10/08/14 0945 10/08/14 1516   09/30/14 1300  micafungin (MYCAMINE) 100 mg in sodium chloride 0.9 % 100 mL IVPB     Comments:  Pharmacy may adjust as needed   100 mg100 mL/hr over 1 Hours Intravenous Every 24 hours 09/30/14 1213 11/12/14 1259   09/27/14 2100  ceFEPIme (MAXIPIME) 2 g in dextrose 5 % 50 mL IVPB  Status:  Discontinued     2 g100 mL/hr over 30 Minutes Intravenous Every 24 hours 09/27/14 0603 09/27/14 1103   09/27/14 2000  vancomycin (VANCOCIN) IVPB 1000 mg/200 mL premix  Status:  Discontinued     1,000 mg200 mL/hr over 60 Minutes Intravenous Every 24 hours 09/27/14 0603 09/27/14 1103   09/27/14 1400  vancomycin (VANCOCIN) IVPB 1000 mg/200 mL premix  Status:  Discontinued     1,000 mg200 mL/hr over 60 Minutes Intravenous Every 12 hours 09/27/14 1103 10/02/14 1348   09/27/14 1200  ceFEPIme (MAXIPIME) 2 g in dextrose 5 % 50 mL IVPB  Status:  Discontinued     2 g100 mL/hr over 30 Minutes  Intravenous Every 12 hours 09/27/14 1103 10/02/14 1348   09/26/14 1930  vancomycin (VANCOCIN) IVPB 1000 mg/200 mL premix     1,000 mg200 mL/hr over 60 Minutes Intravenous  Once 09/26/14 1917 09/26/14 2210   09/26/14 1930  ceFEPIme (MAXIPIME) 2 g in dextrose 5 % 50 mL IVPB     2 g100 mL/hr over 30 Minutes Intravenous  Once 09/26/14 1917 09/26/14 2132      Assessment/Plan: s/p Procedure(s): Left Radial, Brachial, and Ulnar Thrombectomy; Left Brachial to Radial Bypass Graft using Greater Saphenous vein graft from Left Thigh; Left Saphenous Vein Harvest; Intraoperative Arteriogram; Intra-arterial administration of TPA (Left) Febrile overnight. ?source S/P repair perf pre-pyloric ulcer, POD 18 - tolerating TF. Clean, open, packed midline incision. Continue wet to dry dressings. Vent dependent resp failure - per CCM. Trach on vent. Fungemia and PNA with sepsis - Micafungin and ABX per CCM S/P thrombectomy LUE and L brachial to radial bypass graft- management per VVS.  LOS: 19 days    Lahoma Rocker, [facility] Surgery Pager [phone] 10/15/2014

## 2014-10-15 NOTE — Significant Event (Signed)
An unused bag of fentanyl that was notified by nightshift RN has been returned to G-floor pharmacy at 1250pm. RN hand-delivered and given to pharmacist Jacqualin Combes in person.

## 2014-10-15 NOTE — Progress Notes (Addendum)
   [facility]  Subjective  - Tracheostomy in place.  Patient opens eyes when spoken to.   Objective 99/60 94 100.2 F (37.9 C) (Oral) 18 95%  Intake/Output Summary (Last 24 hours) at 10/15/14 0726 Last data filed at 10/15/14 0700  Gross per 24 hour  Intake   1442 ml  Output   2365 ml  Net   -923 ml    Palpable radial and DP pulses bilaterally. 4 x 4 placed in palms to help with maceration of skin. Lungs tracheostomy Heart SV 92 bpm   Assessment/Planning: 49 y.o. female is s/p:  #1: Thrombectomy of left brachial, radial, and ulnar arteries #2: Patch angioplasty of left brachial and ulnar artery #3: Patch angioplasty left radial artery And take back to OR later that day for:  1. Redo Left brachial, ulnar, and radial exposure 2. Thrombectomy Left brachial, ulnar, adn radial artery 3. Angiogram, left upper extremity 4. Left brachial to left radial artery bypass with left leg non-reversed greater saphenous vein 5. Intra-arterial injection of TPA 18 Days Post-Op Lovenox DVT  Palm maceration placed 4 x 4 in hands to help with moisture Will continue to watch fingers, right 4 th digit close attention.  Palpable pulses all 4 extremities.    Laurence Slate [facility] 10/15/2014 7:26 AM --  Laboratory Lab Results:  Recent Labs  10/14/14 0500 10/15/14 0500  WBC 15.9* 13.0*  HGB 7.3* 7.2*  HCT 24.4* 23.7*  PLT 486* 416*   BMET  Recent Labs  10/14/14 0500 10/15/14 0500  NA 145 145  K 2.6* 3.0*  CL 107 107  CO2 30 32  GLUCOSE 111* 151*  BUN 9 8  CREATININE 0.75 0.64  CALCIUM 7.7* 7.6*    COAG Lab Results  Component Value Date   INR 1.17 10/13/2014   INR 1.25 10/05/2014   No results found for: PTT    Agree with above.  Annamarie Major

## 2014-10-15 NOTE — Progress Notes (Signed)
ANTIBIOTIC CONSULT NOTE - FOLLOW UP ANTICOAGULATION NOTE - FOLLOW UP  Pharmacy Consult for Ceftazidime/Vancomycin;Lovenox Indication: PSA and MRSA PNA; chronic brachial artery thrombus  Allergies  Allergen Reactions  . Ambien [Zolpidem Tartrate] Other (See Comments)    Crazy dreams  . Ceftin [Cefuroxime Axetil] Nausea And Vomiting    Pt tolerated Cefepime 09/2014 admission  . Hctz [Hydrochlorothiazide] Other (See Comments)    Urinary retention  . Hydrocodone Nausea Only  . Lodine [Etodolac] Hives  . Promethazine Other (See Comments)    Hallucinations.   . Roxicodone [Oxycodone Hcl] Swelling  . Codeine Rash  . Latex Rash  . Penicillins Rash    Patient Measurements: Height: 4\' 9"  (144.8 cm) Weight: 168 lb 9.6 oz (76.476 kg) IBW/kg (Calculated) : 38.6  Vital Signs: Temp: 100.3 F (37.9 C) (01/07 0848) Temp Source: Axillary (01/07 0848) BP: 115/74 mmHg (01/07 0849) Pulse Rate: 111 (01/07 0800) Intake/Output from previous day: 01/06 0701 - 01/07 0700 In: 1638 [I.V.:1092; IV Piggyback:400] Out: 2365 [Urine:2265; Stool:100] Intake/Output from this shift: Total I/O In: 219 [I.V.:69; IV Piggyback:150] Out: 275 [Urine:275]  Labs:  Recent Labs  10/13/14 0345 10/14/14 0500 10/15/14 0500  WBC 22.8* 15.9* 13.0*  HGB 8.9* 7.3* 7.2*  PLT 609* 486* 416*  CREATININE 0.68 0.75 0.64   Estimated Creatinine Clearance: 72.2 mL/min (by C-G formula based on Cr of 0.64).  Recent Labs  10/12/14 1330 10/15/14 0525  VANCOTROUGH 22.8* 19.4    Assessment: [name] continues on day 7 vancomycin and day 4 ceftazidime for MRSA and pseudomonas pneumonia. Vancomycin trough is therapeutic at 19.4 on 750mg  q12. Ceftazidime dose remains appropriate. Renal function stable.  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 Weight has fluctuated between 65kg to 79kg - today is 76.5kg so will adjust lovenox dose. Hgb is low at 7.2, platelets ok, no bleeding reported.  Goal of Therapy:  Vancomycin trough level 15-20 mcg/ml  Anti-Xa level 0.6-1.2 4 hours after LMWH given Monitor platelets by anticoagulation protocol: Yes  Plan:  1) Continue vancomycin 750mg  IV q12 2) Continue ceftazidime 1g IV q8  3) Change lovenox to 75mg  sq q12   Deboraha Sprang 10/15/2014,9:35 AM

## 2014-10-16 ENCOUNTER — Inpatient Hospital Stay (HOSPITAL_COMMUNITY): Payer: BLUE CROSS/BLUE SHIELD

## 2014-10-16 DIAGNOSIS — R4182 Altered mental status, unspecified: Secondary | ICD-10-CM

## 2014-10-16 LAB — GLUCOSE, CAPILLARY
GLUCOSE-CAPILLARY: 137 mg/dL — AB (ref 70–99)
GLUCOSE-CAPILLARY: 146 mg/dL — AB (ref 70–99)
GLUCOSE-CAPILLARY: 172 mg/dL — AB (ref 70–99)
Glucose-Capillary: 118 mg/dL — ABNORMAL HIGH (ref 70–99)
Glucose-Capillary: 147 mg/dL — ABNORMAL HIGH (ref 70–99)
Glucose-Capillary: 188 mg/dL — ABNORMAL HIGH (ref 70–99)

## 2014-10-16 LAB — BASIC METABOLIC PANEL
ANION GAP: 7 (ref 5–15)
BUN: 7 mg/dL (ref 6–23)
CALCIUM: 8.3 mg/dL — AB (ref 8.4–10.5)
CO2: 33 mmol/L — ABNORMAL HIGH (ref 19–32)
Chloride: 102 mEq/L (ref 96–112)
Creatinine, Ser: 0.6 mg/dL (ref 0.50–1.10)
GFR calc non Af Amer: >90 mL/min (ref >90)
GLUCOSE: 165 mg/dL — AB (ref 70–99)
Potassium: 3.3 mmol/L — ABNORMAL LOW (ref 3.5–5.1)
Sodium: 142 mmol/L (ref 135–145)

## 2014-10-16 LAB — MAGNESIUM: MAGNESIUM: 2 mg/dL (ref 1.5–2.5)

## 2014-10-16 LAB — CBC
HCT: 26.1 % — ABNORMAL LOW (ref 36.0–46.0)
HEMOGLOBIN: 8 g/dL — AB (ref 12.0–15.0)
MCH: 31.1 pg (ref 26.0–34.0)
MCHC: 30.7 g/dL (ref 30.0–36.0)
MCV: 101.6 fL — ABNORMAL HIGH (ref 78.0–100.0)
Platelets: 474 10*3/uL — ABNORMAL HIGH (ref 150–400)
RBC: 2.57 MIL/uL — ABNORMAL LOW (ref 3.87–5.11)
RDW: 16.6 % — ABNORMAL HIGH (ref 11.5–15.5)
WBC: 14.5 10*3/uL — AB (ref 4.0–10.5)

## 2014-10-16 MED ORDER — CLONAZEPAM 0.5 MG PO TABS
0.5000 mg | ORAL_TABLET | Freq: Two times a day (BID) | ORAL | Status: DC
  Administered 2014-10-16 – 2014-10-18 (×5): 0.5 mg
  Filled 2014-10-16 (×5): qty 1

## 2014-10-16 MED ORDER — CLONAZEPAM 0.5 MG PO TABS: 0.5000 mg | ORAL_TABLET | Freq: Two times a day (BID) | ORAL | Status: DC

## 2014-10-16 MED ORDER — CITALOPRAM HYDROBROMIDE 20 MG PO TABS
20.0000 mg | ORAL_TABLET | Freq: Every day | ORAL | Status: DC
  Administered 2014-10-16 – 2014-10-18 (×3): 20 mg
  Filled 2014-10-16 (×3): qty 1

## 2014-10-16 MED ORDER — CITALOPRAM HYDROBROMIDE 20 MG PO TABS: 20.0000 mg | ORAL_TABLET | Freq: Every day | ORAL | Status: DC

## 2014-10-16 NOTE — Progress Notes (Signed)
   [facility]  Subjective  - Eyes open and nods head to yes no questions. More alert.   Objective 128/81 122 99.6 F (37.6 C) (Oral) 19 95%  Intake/Output Summary (Last 24 hours) at 10/16/14 0751 Last data filed at 10/16/14 0700  Gross per 24 hour  Intake 2455.5 ml  Output   2300 ml  Net  155.5 ml    All extremities warm to touch, Palpable radial and DP. Incisions healing well on left arm No change in fingers bilaterally    Assessment/Planning: 49 y.o. female is s/p:  #1: Thrombectomy of left brachial, radial, and ulnar arteries #2: Patch angioplasty of left brachial and ulnar artery #3: Patch angioplasty left radial artery And take back to OR later that day for:  1. Redo Left brachial, ulnar, and radial exposure 2. Thrombectomy Left brachial, ulnar, adn radial artery 3. Angiogram, left upper extremity 4. Left brachial to left radial artery bypass with left leg non-reversed greater saphenous vein 5. Intra-arterial injection of TPA POD 19 Lovenox DVT Will continue to observe fingers for necrotic changes.  Pulses stable in all extremities.    Laurence Slate [facility] 10/16/2014 7:51 AM --  Laboratory Lab Results:  Recent Labs  10/15/14 0500 10/16/14 0500  WBC 13.0* 14.5*  HGB 7.2* 8.0*  HCT 23.7* 26.1*  PLT 416* 474*   BMET  Recent Labs  10/15/14 0500 10/16/14 0500  NA 145 142  K 3.0* 3.3*  CL 107 102  CO2 32 33*  GLUCOSE 151* 165*  BUN 8 7  CREATININE 0.64 0.60  CALCIUM 7.6* 8.3*    COAG Lab Results  Component Value Date   INR 1.17 10/13/2014   INR 1.25 10/05/2014   No results found for: PTT

## 2014-10-16 NOTE — Progress Notes (Signed)
Placed back on full vent support with previous settings due to increased WOB, increased HR, very diaphoretic, appears uncomfortable. Pt looking much better at this time. RT Will continue to monitor.

## 2014-10-16 NOTE — Progress Notes (Addendum)
Pt with jerking motions of upper body and head  startle like  movements .Dr Halford Chessman informed and in room to note.

## 2014-10-16 NOTE — Progress Notes (Addendum)
S: Called to bedside to assess for concerns of seizure like activity  O: Blood pressure 146/87, pulse 120, temperature 99.1 F (37.3 C), temperature source Oral, resp. rate 34, height 4\' 9"  (1.448 m), weight 171 lb 8 oz (77.792 kg), SpO2 100 %.   Exam: General: chronically ill in NAD Neuro:  Eyes opens, tracks, follows some simple commands, extensor flexion noted with intermittent jerking movements CV: tachy in 120's, VSS Extremities:  Warm/dry   A: R/O Seizure activity Hx Cardiac Arrest  S/P Duodenal Perforation & L brachial bypass Fungemia    P: Not clear that activity on exam is true seizure activity.  EEG was performed on 1/2 but no read as of yet.  There is conflicting information from family regarding her baseline - son reports that she does this at home as well.  Concern this may be myoclonus from prior arrest.    Neuro evaluation for potential seizure activity, appreciate input.  Will hold on initiating anti-epileptic drugs until seen by Neuro   Noe Gens, NP-C [facility] Pgr: [phone] or [phone]

## 2014-10-16 NOTE — Evaluation (Signed)
Physical Therapy Re-Evaluation Patient Details Name: [name] MRN: [id number] DOB: 04/20/1966 Today's Date: 10/16/2014   History of Present Illness  49 yo female smoker with PMH of MS, depression presents with abd pain, found to have pneumoperitoneum 2/2 small bowel rupture, also with new cold L hand.  Required surgery  for DU perf & left brachial bypass, fungemia noted on admission cultures. Coded October 25, 2023. Trached October 30, 2022.  Clinical Impression  Pt seen for re-evaluation following the above complications. Demonstrates significant decline in level of function since initial evaluation. Pt currently with functional limitations due to the deficits listed below (see PT Problem List). Demonstrates extensor type posturing with jerking type tremors throughout, not previously noted on initial evaluation.  Please advise for desired vital parameters and limits while working with pt during follow-up mobility training. Pt will benefit from skilled PT to increase their independence and safety with mobility to allow discharge to the venue listed below.       Follow Up Recommendations Supervision/Assistance - 24 hour;LTACH    Equipment Recommendations  None recommended by PT    Recommendations for Other Services OT consult     Precautions / Restrictions Precautions Precautions: Fall Precaution Comments: Monitor for skin integrity Required Braces or Orthoses: Other Brace/Splint (PRAFO) Other Brace/Splint: PRAFO Restrictions Weight Bearing Restrictions: No      Mobility  Bed Mobility Overal bed mobility: Needs Assistance;+2 for physical assistance             General bed mobility comments: Extensor type posturing with jerking type tremors. HOB elevated to 50 degrees HR 130-140. Delayed reaction to painful stimuli bil UEs and LEs with delayed response to visual threat Bil. Pt diaphoretic. Repositioned and adjusted UEs and LEs for skin protection and prevention of contractures with  PRAFOs.  Transfers                    Ambulation/Gait                Stairs            Wheelchair Mobility    Modified Rankin (Stroke Patients Only)       Balance                                             Pertinent Vitals/Pain Pain Assessment:  (CPOT 3/8) Pain Intervention(s): Monitored during session  HR was 130-140 bpm during therapy session, BP 137/87, resp 30-40, SpO2 95% 10L supplemental O2 on trach collar.    Home Living Family/patient expects to be discharged to:: Unsure Living Arrangements: Other (Comment) ("land-lady") Available Help at Discharge: Other (Comment) ("Land-lady") Type of Home: Mobile home Home Access: Stairs to enter Entrance Stairs-Rails: Right Entrance Stairs-Number of Steps: 3-4 Home Layout: One level Home Equipment: Walker - 2 wheels;Cane - single point Additional Comments: Pt trached and unable to answer questions. List pulled from previous evaluation on 12/30    Prior Function Level of Independence: Needs assistance   Gait / Transfers Assistance Needed: Ambulated with a rolling walker or cane without assistance.  ADL's / Homemaking Assistance Needed: Needed assist for bathing and dressing.        Hand Dominance   Dominant Hand: Right    Extremity/Trunk Assessment   Upper Extremity Assessment: Defer to OT evaluation (Extensor tone BIL)           Lower Extremity Assessment:  Difficult to assess due to impaired cognition (Delayed response to painful stimuli bil. Extensor type tone)         Communication   Communication: Tracheostomy  Cognition Arousal/Alertness: Lethargic Behavior During Therapy: Flat affect Overall Cognitive Status: Impaired/Different from baseline Area of Impairment: Attention;Following commands   Current Attention Level: Sustained (for 1-2 seconds)   Following Commands: Follows one step commands with increased time;Follows one step commands inconsistently        General Comments: Requires multiple cues for pt to sustain attention with visual tracking  for only 1-2 seconds.    General Comments      Exercises General Exercises - Upper Extremity Elbow Flexion: AAROM;Both;5 reps;Supine Elbow Extension: AAROM;Both;5 reps;Supine      Assessment/Plan    PT Assessment Patient needs continued PT services  PT Diagnosis Difficulty walking;Generalized weakness;Altered mental status;Acute pain   PT Problem List Decreased strength;Decreased range of motion;Decreased activity tolerance;Decreased balance;Decreased mobility;Decreased cognition;Decreased knowledge of use of DME;Decreased safety awareness;Cardiopulmonary status limiting activity;Pain;Decreased coordination;Decreased knowledge of precautions;Impaired tone;Decreased skin integrity  PT Treatment Interventions DME instruction;Gait training;Functional mobility training;Therapeutic activities;Therapeutic exercise;Balance training;Neuromuscular re-education;Cognitive remediation;Patient/family education;Modalities;Wheelchair mobility training   PT Goals (Current goals can be found in the Care Plan section) Acute Rehab PT Goals Patient Stated Goal: None stated PT Goal Formulation: Patient unable to participate in goal setting Time For Goal Achievement: 11/04/14 Potential to Achieve Goals: Fair    Frequency Min 2X/week   Barriers to discharge Decreased caregiver support Will require health care trained staff    Co-evaluation               End of Session Equipment Utilized During Treatment: Oxygen Activity Tolerance: Patient limited by lethargy;Other (comment) (level of arousal) Patient left: with call bell/phone within reach;with restraints reapplied;in bed;with bed alarm set Nurse Communication: Mobility status;Other (comment) (vitals)         Time: 2637-8588 PT Time Calculation (min) (ACUTE ONLY): 19 min   Charges:   PT Evaluation $PT Re-evaluation: 1 Procedure     PT G  Codes:        Ellouise Newer 10/16/2014, 11:12 AM Elayne Snare, [place]

## 2014-10-16 NOTE — Progress Notes (Signed)
PULMONARY / CRITICAL CARE MEDICINE HISTORY AND PHYSICAL EXAMINATION   Name: [name] MRN: [id number] DOB: 12-22-1965    ADMISSION DATE:  09/26/2014  PRIMARY SERVICE: PCCM  CHIEF COMPLAINT:  Abd pain  BRIEF PATIENT DESCRIPTION:  49yof smoker with PMH of MS, depression presented 12/19 on transfer from [facility] with abd pain, found to have pneumoperitoneum 2/2 small bowel rupture, also with new cold L hand.  Required surgery for DU perf & left brachial bypass, fungemia noted on admission cultures.  SIGNIFICANT EVENTS / STUDIES:  12/19 Admitted, CT Abd with pneumoperitoneum, cold L hand concerning for arterial clot as well. 12/20 ex-lap [name]) with omental patch for perforated prepyloric gastric ulcer; L brachial and ulnar artery embolectomy [name]) 12/20 LE doppler >>neg 12/20 Left brachial to left radial artery bypass with left leg greater saphenous vein for recurrent occlusion  12/20 Self extubated - had to be reintubated 12/21 echo >>nml LVEF 12/26 CT abdomen >> pelvic fluid 12/28 TEE neg 17-Oct-2023 reintubated, coded 4 minutes; CT head without acute change 01/01 High dose pressors, CT abdomen> no fluid collection noted; LLL pneumonia 01/03 PSV 4 hours, Hydrocortisone added, following simple commands, pressors improved, oxygenation improved 01/04 off pressors 01/06 Trach  LINES / TUBES: R IJ CVL 12/20 >> 12/29 LIJ CVL 12/28 >> JP drain RUQ 12/20 >>  ETT 12/19 >> 12/29, 12/31>>  SUBJECTIVE:  Intermittent twitching (?startle response).  Tolerating SBT.  VITAL SIGNS: Temp:  [98.8 F (37.1 C)-100.1 F (37.8 C)] 98.8 F (37.1 C) (01/08 0700) Pulse Rate:  [103-125] 123 (01/08 0800) Resp:  [12-35] 18 (01/08 0800) BP: (107-140)/(58-103) 134/80 mmHg (01/08 0800) SpO2:  [93 %-100 %] 98 % (01/08 0800) FiO2 (%):  [40 %] 40 % (01/08 0326) Weight:  [171 lb 8 oz (77.792 kg)] 171 lb 8 oz (77.792 kg) (01/08 0500) HEMODYNAMICS: CVP:  [2 mmHg-23 mmHg] 9 mmHg  Vent Mode:  [-]  PRVC FiO2 (%):  [40 %] 40 % Set Rate:  [18 bmp] 18 bmp Vt Set:  [500 mL] 500 mL PEEP:  [5 cmH20] 5 cmH20 Plateau Pressure:  [17 BJS28-31 cmH20] 28 cmH20 INTAKE / OUTPUT: Intake/Output      01/07 0701 - 01/08 0700 01/08 0701 - 01/09 0700   I.V. (mL/kg) 509 (6.5) 20 (0.3)   Other 100    NG/GT 1100 50   IV Piggyback 750    Total Intake(mL/kg) 2459 (31.6) 70 (0.9)   Urine (mL/kg/hr) 2200 (1.2) 200 (1.3)   Stool 100 (0.1)    Total Output 2300 200   Net +159 -130          PHYSICAL EXAMINATION:  Gen; awake on vent HEENT: trach site clean PULM: no wheeze CV: Tachy, no mgr Ab: BS+, midline scar well dressed EXT: warm, trace edema Neuro: awake, follows simple commands, answers orientation questions with nods appropriately  LABS: CBC Recent Labs     10/14/14  0500  10/15/14  0500  10/16/14  0500  WBC  15.9*  13.0*  14.5*  HGB  7.3*  7.2*  8.0*  HCT  24.4*  23.7*  26.1*  PLT  486*  416*  474*    BMET Recent Labs     10/14/14  0500  10/15/14  0500  10/16/14  0500  NA  145  145  142  K  2.6*  3.0*  3.3*  CL  107  107  102  CO2  30  32  33*  BUN  9  8  7  CREATININE  0.75  0.64  0.60  GLUCOSE  111*  151*  165*    Electrolytes Recent Labs     10/14/14  0500  10/14/14  1800  10/15/14  0500  10/16/14  0500  CALCIUM  7.7*   --   7.6*  8.3*  MG   --   1.6   --   2.0    Glucose Recent Labs     10/15/14  1207  10/15/14  1632  10/15/14  1937  10/15/14  2349  10/16/14  0351  10/16/14  0810  GLUCAP  250*  221*  121*  136*  137*  188*    Imaging Dg Chest Port 1 View  10/15/2014   CLINICAL DATA:  49 year old female currently admitted with a diagnosis of left lower lobe pneumonia  EXAM: PORTABLE CHEST - 1 VIEW  COMPARISON:  Prior chest x-ray 10/14/2014  FINDINGS: Tracheostomy tube tip remains midline and at the level of the clavicles. There is a left IJ approach central venous catheter with tip well positioned in the mid SVC. The nasogastric tube is incompletely  imaged. The tip lies off the field of view, presumably within the stomach. Incompletely imaged surgical changes of ACDF at C6-C7.  Persistent left lower lobe airspace opacity and superimposed volume loss. Slightly increased right basilar opacity. No pulmonary edema. No pneumothorax. No acute osseous abnormality. Mild pulmonary vascular congestion has improved compared to prior.  IMPRESSION: 1. Unchanged appearance of the left lower lobe infiltrate and/or atelectasis. 2. Slightly increased right basilar opacity favored to reflect atelectasis. 3. Slight interval decrease in pulmonary vascular congestion compared to the prior chest x-ray. 4. Stable and satisfactory support apparatus.   Electronically Signed   By: Jacqulynn Cadet M.D.   On: 10/15/2014 07:49   Dg Chest Port 1 View  10/14/2014   CLINICAL DATA:  New tracheostomy  EXAM: PORTABLE CHEST - 1 VIEW  COMPARISON:  Portable exam 1701 hr compared to 0643 hr  FINDINGS: New tracheostomy tube tip projecting over tracheal air column.  Nasogastric tube extends into stomach.  LEFT jugular central venous catheter tip projects over SVC.  Normal heart size with slight pulmonary vascular congestion.  Atherosclerotic calcification aorta.  Mild RIGHT basilar atelectasis.  Probable small LEFT pleural effusion with atelectasis versus consolidation at LEFT lung base.  No pneumothorax.  IMPRESSION: No pneumothorax following tracheostomy placement.  Otherwise little changed.   Electronically Signed   By: Lavonia Dana M.D.   On: 10/14/2014 17:27    ASSESSMENT / PLAN:  PULMONARY ETT 12/19 >> 12/29, 12/31 >> 1/06 Donata Duff 1/06 >> A:  Acute respiratory failure with hypoxemia > due to MRSA and Pseudomonas HCAP Difficult airway 12/31 due to cord edema Hx of COPD P:   Pressure support wean to trach collar as tolerated Trach care F/u CXR intermittently PRN BD's  CARDIOVASCULAR A: Septic Shock 1/1 2nd to HCAP >> resolved Demand ischemia 1/1 Lt brachial/radial/ulnar  artery thrombectomy P:   Monitor hemodyamics Goal CVP 8 to 12 Post-op care per [facility] Full dose lovenox >> resumed 1/7 Continue ASA  RENAL A: AKI 2nd to sepsis >> resolved Hypokalemia P:   Monitor BMET and UOP Replace electrolytes as needed   GASTROINTESTINAL A: Perforated gastric ulcer and pneumoperitoneum s/p laparotomy Hx of GERD P:   Post-op care per [facility] Pantoprazole for stress ulcer prophylaxis Continue tube feeds  HEMATOLOGIC A: Anemia of critical illness P:   F/u CBC Lovenox for DVT prevention  INFECTIOUS A: Peritonitis after bowel perforation 12/20 > Candida glabrata peritonitis and fungemia Septic shock 1/1 > due to MRSA and pseudomonas HCAP, procalcitonin 38.5 P:  Day 17 of micafungin for peritonitis, fungemia, plan 6 weeks per ID Day 9 of Abx for HCAP >> Day 7 of vancomycin, day 4 fortaz, plan 14 days per ID  Blood 12/19 >> Candida glabrata Peritoneal fluid 12/20 >> Candida glabrata Sputum 12/31 >> Pseudomonas aeruginosa, MRSA  ENDOCRINE A:  Hyperglycemia Relative adrenal insufficiency, stress dose steroids held 1/6 P:   SSI with Lantus D/c solu cortef 1/08   NEUROLOGIC A: Post op pain Hx of multiple sclerosis, fibromyalgia, peripheral neuropathy, Guillian Barre syndrome Acute encephalopathy after cardiac arrest 1/1 > improving as no delirium on exam 1/7 AM P:   RASS goal 0 Fentanyl, versed prn Haldol prn agigation> check EKG for QTc F/U EEG 1/2 >> Resume outpt celexa, klonopin Hold outpt neurontin, flexeril >> will start to slowly resume after trach  [name], MD [place] 10/16/2014, 9:05 AM Pager:  [phone] After 3pm call: [phone]

## 2014-10-16 NOTE — Consult Note (Signed)
NEURO HOSPITALIST CONSULT NOTE    Reason for Consult: PCCM  HPI:                                                                                                                                          [name] is an 49 y.o. female hospitalized for small bowel obstruction and then underwent a left brachial plexus bypass surgery.   On Oct 28, 2023 coded for 4 minutes.  During hospitalization patient was noted to have intermittent brief periods of bilateral UE and LE jerking.  The jerking was most noted when tactile stimuli was given or patient was startled. EEG was obtained on 1/2 but due to computer difficulties final read was not placed. Patient continued to have such events and neurology was asked to see to rule out seizure versus myoclonus.  Past Medical History  Diagnosis Date  . Anxiety   . Depression   . COPD (chronic obstructive pulmonary disease)   . MS (multiple sclerosis)   . Fibromyalgia   . Impaired fasting glucose   . 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 Eating disorder   . Chronic low back pain   . PONV (postoperative nausea and vomiting)   . Chronic bronchitis     "yearly" (02/11/2013)  . Borderline diabetes   . GERD (gastroesophageal reflux disease)   . Migraine     "I use to" (02/11/2013)  . Arthritis     "hands & feet" (02/11/2013)  . Peripheral neuropathy     [name] 02/11/2013  . B12 deficiency anemia     [name] 02/11/2013  . Chronic pain syndrome     [name] 02/11/2013  . UTI (lower urinary tract infection) 02/11/2013    [name] 02/11/2013  . Chronic edema     BLE/notes 02/11/2013  . Ataxia   . Peripheral neuropathy   . Muscle weakness of lower extremity     bilateral    Past Surgical History  Procedure Laterality Date  . Abdominal hysterectomy  ~ 1987; ~ 2004    "[place]; [place]" (02/11/2013)  . Cesarean section  1007; 1984; 1986  . Anterior cervical decomp/discectomy fusion  2009; 2011  . Tonsillectomy   1990's?  . Cataract extraction w/phaco  06/20/2011    Procedure: CATARACT EXTRACTION PHACO AND INTRAOCULAR LENS PLACEMENT (IOC);  Surgeon: [name] T. Gershon Crane;  Location: [facility];  Service: Ophthalmology;  Laterality: Left;  CDE 1.81  . Cataract extraction w/phaco  07/04/2011    Procedure: CATARACT EXTRACTION PHACO AND INTRAOCULAR LENS PLACEMENT (IOC);  Surgeon: [name] T. Gershon Crane;  Location: [facility];  Service: Ophthalmology;  Laterality: Right;  CDE: 1.76  . Yag laser application Right 10/29/9756    Procedure: YAG LASER APPLICATION;  Surgeon: [name] T. Gershon Crane, MD;  Location: [facility];  Service: Ophthalmology;  Laterality: Right;  . Yag laser application Left 6/96/2952    Procedure: YAG LASER APPLICATION;  Surgeon: [name] T. Gershon Crane, MD;  Location: [facility];  Service: Ophthalmology;  Laterality: Left;  . Appendectomy    . Laparotomy N/A 09/27/2014    Procedure: Exploratory Laparotomy, Biopsy of Perforated Gastric Ulcer, Closure with omental patch;  Surgeon: [name], MD;  Location: [facility];  Service: General;  Laterality: N/A;  . Embolectomy Left 09/27/2014    Procedure: Left Brachial, Radial,Ulnar Embolectomy with patch angioplasty left brachial, radial and ulnar artery;  Surgeon: [name], MD;  Location: [facility];  Service: Vascular;  Laterality: Left;  . Embolectomy Left 09/27/2014    Procedure: Left Radial, Brachial, and Ulnar Thrombectomy; Left Brachial to Radial Bypass Graft using Greater Saphenous vein graft from Left Thigh; Left Saphenous Vein Harvest; Intraoperative Arteriogram; Intra-arterial administration of TPA;  Surgeon: [name], MD;  Location: [facility];  Service: Vascular;  Laterality: Left;    Family History  Problem Relation Age of Onset  . Anesthesia problems Neg Hx   . Hypotension Neg Hx   . Malignant hyperthermia Neg Hx   . Pseudochol deficiency Neg Hx   . Hyperlipidemia Mother   . Heart disease Mother   . Cancer Mother     lung  . Diabetes Father   . Heart disease Father    . Cancer Maternal Grandmother     breast     Social History:  reports that she has been smoking Cigarettes.  She has a 33 pack-year smoking history. She has quit using smokeless tobacco. She reports that she does not drink alcohol or use illicit drugs.  Allergies  Allergen Reactions  . Ambien [Zolpidem Tartrate] Other (See Comments)    Crazy dreams  . Ceftin [Cefuroxime Axetil] Nausea And Vomiting    Pt tolerated Cefepime 09/2014 admission  . Hctz [Hydrochlorothiazide] Other (See Comments)    Urinary retention  . Hydrocodone Nausea Only  . Lodine [Etodolac] Hives  . Promethazine Other (See Comments)    Hallucinations.   . Roxicodone [Oxycodone Hcl] Swelling  . Codeine Rash  . Latex Rash  . Penicillins Rash    MEDICATIONS:                                                                                                                     Prior to Admission:  Prescriptions prior to admission  Medication Sig Dispense Refill Last Dose  . citalopram (CELEXA) 20 MG tablet Take 20 mg by mouth daily at 12 noon.    09/26/2014  . clonazePAM (KLONOPIN) 0.5 MG tablet Take 0.5 mg by mouth 2 (two) times daily as needed for anxiety.   09/26/2014  . cyclobenzaprine (FLEXERIL) 10 MG tablet TAKE (1) TABLET BY MOUTH AT BEDTIME. 30 tablet 2 84/13/2440  . folic acid (FOLVITE) 1 MG tablet Take 1 tablet (1 mg total) by mouth daily. 60 tablet 2 09/26/2014  . gabapentin (NEURONTIN) 300 MG capsule One in am ,  one 3pm, 2qhs (Patient taking differently: Take 300-600 mg by mouth 3 (three) times daily. One in am ,one at 3pm, and 2 qhs.) 120 capsule 6 09/26/2014  . meloxicam (MOBIC) 15 MG tablet TAKE 1 TABLET BY MOUTH DAILY. 30 tablet 4 09/26/2014  . Multiple Vitamin (MULTIVITAMIN WITH MINERALS) TABS tablet Take 1 tablet by mouth daily at 12 noon.   09/26/2014  . oxyCODONE-acetaminophen (PERCOCET) 10-325 MG per tablet Take 1 tablet by mouth every 4 (four) hours as needed for pain (no greater than 5 per day).  150 tablet 0 09/26/2014  . potassium chloride (K-DUR) 10 MEQ tablet Take 20 mEq by mouth daily at 12 noon.    09/26/2014  . thiamine (VITAMIN B-1) 100 MG tablet Take 100 mg by mouth daily.   09/26/2014  . vitamin B-12 (CYANOCOBALAMIN) 1000 MCG tablet Take 1,000 mcg by mouth daily at 12 noon.   09/26/2014  . albuterol (PROVENTIL HFA;VENTOLIN HFA) 108 (90 BASE) MCG/ACT inhaler Inhale 2 puffs into the lungs every 6 (six) hours as needed for wheezing or shortness of breath (copd).   09/24/2014  . albuterol (PROVENTIL) (2.5 MG/3ML) 0.083% nebulizer solution Take 2.5 mg by nebulization every 4 (four) hours as needed for wheezing or shortness of breath (copd).   More Than A Month  . nicotine (NICODERM CQ - DOSED IN MG/24 HOURS) 14 mg/24hr patch Place 1 patch (14 mg total) onto the skin daily. 28 patch 0 More Than A Month   Scheduled: . antiseptic oral rinse  7 mL Mouth Rinse QID  . aspirin  325 mg Per Tube Daily  . cefTAZidime (FORTAZ)  IV  1 g Intravenous 3 times per day  . chlorhexidine  15 mL Mouth Rinse BID  . citalopram  20 mg Per Tube Q1200  . clonazePAM  0.5 mg Per Tube Q12H  . enoxaparin (LOVENOX) injection  1 mg/kg Subcutaneous Q12H  . insulin aspart  0-9 Units Subcutaneous 6 times per day  . insulin glargine  10 Units Subcutaneous Daily  . micafungin [facility]) IV  100 mg Intravenous Q24H  . pantoprazole sodium  40 mg Per Tube Q1200  . sodium chloride  10-40 mL Intracatheter Q12H  . vancomycin  750 mg Intravenous Q12H   Continuous: . sodium chloride 20 mL/hr at 10/14/14 2000  . feeding supplement (VITAL AF 1.2 CAL) 1,000 mL (10/16/14 0956)   NID:POEUMPNTIRWER (TYLENOL) oral liquid 160 mg/5 mL, fentaNYL, Gerhardt's butt cream, haloperidol lactate, ipratropium-albuterol, labetalol, midazolam, ondansetron (ZOFRAN) IV, sodium chloride   ROS:                                                                                                                                       History  obtained from unobtainable from patient due to trach    Blood pressure 146/87, pulse 120, temperature 99.1 F (37.3 C), temperature source Oral, resp. rate 34, height 4\' 9"  (1.448 m),  weight 77.792 kg (171 lb 8 oz), SpO2 100 %.   Neurologic Examination:                                                                                                      HEENT-  Normocephalic, no lesions, without obvious abnormality.  Normal external eye and conjunctiva.  Normal TM's bilaterally.  Normal auditory canals and external ears. Normal external nose, mucus membranes and septum.  Normal pharynx. Cardiovascular- normal apical impulse and S1, S2 normal, pulses palpable throughout   Lungs- no tachypnea, retractions or cyanosis Abdomen- normal findings: bowel sounds normal Extremities- less then 2 second capillary refill and no edema Lymph-no adenopathy palpable Musculoskeletal-no joint tenderness, deformity or swelling, no muscular tenderness noted Skin-warm and dry, no hyperpigmentation, vitiligo, or suspicious lesions  Neurological Examination Mental Status: Alert, tracks my movements with her eyes attempts to follow simple commands such as looking in all directions, smiling, raising her arms.  Speech unable to due to trach.    Cranial Nerves: II: Discs flat bilaterally; Visual fields grossly normal, pupils equal, round, reactive to light and accommodation III,IV, VI: ptosis not present, extra-ocular motions intact bilaterally V,VII: smile symmetric, facial light touch sensation normal bilaterally VIII: hearing normal bilaterally IX,X: gag reflex present XI: bilateral shoulder shrug XII: midline tongue extension Motor: Moves all extremities antigravity with intermittent startle myoclonus.  Sensory: withdraws from noxious stimuli in all extremities.  Deep Tendon Reflexes: 2+ and symmetric throughout UE and KJ with no AJ Plantars: Right: downgoing   Left: downgoing Cerebellar: Unable to assess  due to confusion.  Gait: not assessed due to patient safety.       Lab Results: Basic Metabolic Panel:  Recent Labs Lab 10/10/14 0415 10/10/14 1708 10/11/14 0400 10/12/14 0415 10/13/14 0345 10/14/14 0500 10/14/14 1800 10/15/14 0500 10/16/14 0500  NA 138 140 137 144 145 145  --  145 142  K 3.0* 4.3 4.3 3.9 3.1* 2.6*  --  3.0* 3.3*  CL 104 106 108 111 108 107  --  107 102  CO2 26 28 27 29 31 30   --  32 33*  GLUCOSE 324* 254* 315* 221* 208* 111*  --  151* 165*  BUN 23 16 18 18 13 9   --  8 7  CREATININE 1.07 0.84 0.75 0.74 0.68 0.75  --  0.64 0.60  CALCIUM 7.3* 7.7* 7.6* 7.9* 7.8* 7.7*  --  7.6* 8.3*  MG 2.2 2.0 2.1 2.1  --   --  1.6  --  2.0  PHOS 2.5  --  2.0*  --   --   --   --   --   --     Liver Function Tests:  Recent Labs Lab 10/11/14 0400  AST 294*  ALT 226*  ALKPHOS 128*  BILITOT 0.7  PROT 6.8  ALBUMIN 2.0*   No results for input(s): LIPASE, AMYLASE in the last 168 hours. No results for input(s): AMMONIA in the last 168 hours.  CBC:  Recent Labs Lab 10/11/14 0400 10/12/14 0415 10/13/14 0345 10/14/14 0500 10/15/14 0500 10/16/14  0500  WBC 21.1* 20.0* 22.8* 15.9* 13.0* 14.5*  NEUTROABS 19.2* 17.0*  --   --   --   --   HGB 8.3* 8.0* 8.9* 7.3* 7.2* 8.0*  HCT 27.5* 26.5* 28.7* 24.4* 23.7* 26.1*  MCV 102.2* 102.3* 104.7* 103.8* 102.6* 101.6*  PLT 453* 486* 609* 486* 416* 474*    Cardiac Enzymes:  Recent Labs Lab 10/09/14 1600  TROPONINI 2.31*    Lipid Panel: No results for input(s): CHOL, TRIG, HDL, CHOLHDL, VLDL, LDLCALC in the last 168 hours.  CBG:  Recent Labs Lab 10/15/14 1937 10/15/14 2349 10/16/14 0351 10/16/14 0810 10/16/14 1149  GLUCAP 121* 136* 137* 188* 146*    Microbiology: Results for orders placed or performed during the hospital encounter of 09/26/14  Urine culture     Status: None   Collection Time: 09/26/14  7:22 PM  Result Value Ref Range Status   Specimen Description URINE, CATHETERIZED  Final   Special  Requests NONE  Final   Culture  Setup Time   Final    09/28/2014 00:50 Performed at [facility] Performed at [facility]   Final   Culture NO GROWTH Performed at [facility]   Final   Report Status 09/29/2014 FINAL  Final  Blood culture (routine x 2)     Status: None   Collection Time: 09/26/14  8:56 PM  Result Value Ref Range Status   Specimen Description BLOOD LEFT ANTECUBITAL DRAWN BY RN  Final   Special Requests BOTTLES DRAWN AEROBIC ONLY 4CC  Final   Culture  Setup Time   Final    09/30/2014 20:55 Performed at [facility]    Culture   Final    CANDIDA GLABRATA Note: Gram Stain Report Called to,Read Back By and Verified With: WILKINSON R([facility])@1140  ON 09/30/14 BY Elza Rafter. Performed at [facility] Performed at [facility]    Report Status 10/03/2014 FINAL  Final  Blood culture (routine x 2)     Status: None   Collection Time: 09/26/14  9:00 PM  Result Value Ref Range Status   Specimen Description BLOOD LEFT ANTECUBITAL DRAWN BY RN  Final   Special Requests BOTTLES DRAWN AEROBIC ONLY 4CC  Final   Culture   Final    CANDIDA GLABRATA Note: Gram Stain Report Called to,Read Back By and Verified With: DICKENS B AT [facility] AT [id number] ON [id number] BY FORSYTH K Performed at [facility]    Report Status 10/04/2014 FINAL  Final  MRSA PCR Screening     Status: None   Collection Time: 09/26/14 11:30 PM  Result Value Ref Range Status   MRSA by PCR NEGATIVE NEGATIVE Final    Comment:        The GeneXpert MRSA Assay (FDA approved for NASAL specimens only), is one component of a comprehensive MRSA colonization surveillance program. It is not intended to diagnose MRSA infection nor to guide or monitor treatment for MRSA infections.   Body fluid culture     Status: None   Collection Time: 09/27/14  1:59 AM  Result Value Ref Range Status   Specimen Description FLUID PERITONEAL  Final   Special  Requests ON SWAB  Final   Gram Stain   Final    RARE WBC PRESENT, PREDOMINANTLY PMN NO ORGANISMS SEEN Performed at [facility]    Culture   Final    FEW CANDIDA GLABRATA Note: CRITICAL RESULT CALLED TO, READ  BACK BY AND VERIFIED WITH: BEVON BY [facility] 12/23 12N Performed at [facility]    Report Status 10/03/2014 FINAL  Final  Anaerobic culture     Status: None   Collection Time: 09/27/14  1:59 AM  Result Value Ref Range Status   Specimen Description FLUID PERITONEAL  Final   Special Requests NONE  Final   Gram Stain   Final    FEW WBC PRESENT, PREDOMINANTLY PMN NO SQUAMOUS EPITHELIAL CELLS SEEN NO ORGANISMS SEEN Performed at [facility]    Culture   Final    NO ANAEROBES ISOLATED Performed at [facility]    Report Status 10/02/2014 FINAL  Final  Culture, blood (routine x 2)     Status: None   Collection Time: 10/01/14  9:24 PM  Result Value Ref Range Status   Specimen Description BLOOD LEFT HAND  Final   Special Requests BOTTLES DRAWN AEROBIC ONLY 10CC  Final   Culture   Final    NO GROWTH 5 DAYS Note: Culture results may be compromised due to an excessive volume of blood received in culture bottles. Performed at [facility]    Report Status 10/08/2014 FINAL  Final  Culture, blood (routine x 2)     Status: None   Collection Time: 10/01/14  9:35 PM  Result Value Ref Range Status   Specimen Description BLOOD RIGHT HAND  Final   Special Requests BOTTLES DRAWN AEROBIC ONLY 10CC  Final   Culture   Final    NO GROWTH 5 DAYS Note: Culture results may be compromised due to an excessive volume of blood received in culture bottles. Performed at [facility]    Report Status 10/08/2014 FINAL  Final  Urine culture     Status: None   Collection Time: 10/06/14 12:02 PM  Result Value Ref Range Status   Specimen Description URINE, CATHETERIZED  Final   Special Requests NONE  Final   Colony Count NO GROWTH Performed at  [facility]   Final   Culture NO GROWTH Performed at [facility]   Final   Report Status 10/07/2014 FINAL  Final  Clostridium Difficile by PCR     Status: None   Collection Time: 10/07/14  8:33 PM  Result Value Ref Range Status   C difficile by pcr NEGATIVE NEGATIVE Final  Culture, blood (routine x 2)     Status: None   Collection Time: 10/08/14 10:54 AM  Result Value Ref Range Status   Specimen Description BLOOD RIGHT HAND  Final   Special Requests BOTTLES DRAWN AEROBIC ONLY 3CC  Final   Culture   Final    STAPHYLOCOCCUS SPECIES (COAGULASE NEGATIVE) Note: THE SIGNIFICANCE OF ISOLATING THIS ORGANISM FROM A SINGLE SET OF BLOOD CULTURES WHEN MULTIPLE SETS ARE DRAWN IS UNCERTAIN. PLEASE NOTIFY THE MICROBIOLOGY DEPARTMENT WITHIN ONE WEEK IF SPECIATION AND SENSITIVITIES ARE REQUIRED. Note: Gram Stain Report Called to,Read Back By and Verified With: REBECCA SARINE ON 1.1.2016 AT 10:25P BY WILEJ Performed at [facility]    Report Status 10/11/2014 FINAL  Final  Culture, blood (routine x 2)     Status: None   Collection Time: 10/08/14 11:04 AM  Result Value Ref Range Status   Specimen Description BLOOD RIGHT HAND  Final   Special Requests BOTTLES DRAWN AEROBIC ONLY 3CC  Final   Culture   Final    NO GROWTH 5 DAYS Performed at [facility]    Report Status 10/14/2014 FINAL  Final  Culture, respiratory (NON-Expectorated)     Status: None   Collection Time: 10/08/14 11:53 AM  Result Value Ref Range Status   Specimen Description TRACHEAL ASPIRATE  Final   Special Requests NONE  Final   Gram Stain   Final    FEW WBC PRESENT,BOTH PMN AND MONONUCLEAR NO SQUAMOUS EPITHELIAL CELLS SEEN ABUNDANT GRAM POSITIVE COCCI IN PAIRS IN CLUSTERS RARE GRAM NEGATIVE RODS Performed at [facility]    Culture   Final    MODERATE PSEUDOMONAS AERUGINOSA ABUNDANT METHICILLIN RESISTANT STAPHYLOCOCCUS AUREUS Note: RIFAMPIN AND GENTAMICIN SHOULD NOT BE USED AS  SINGLE DRUGS FOR TREATMENT OF STAPH INFECTIONS. CRITICAL RESULT CALLED TO, READ BACK BY AND VERIFIED WITH: [facility] RN [id number] 10/11/13 GUSTK Performed at [facility]    Report Status 10/11/2014 FINAL  Final   Organism ID, Bacteria PSEUDOMONAS AERUGINOSA  Final   Organism ID, Bacteria METHICILLIN RESISTANT STAPHYLOCOCCUS AUREUS  Final      Susceptibility   Pseudomonas aeruginosa - MIC*    CEFEPIME 2 SENSITIVE Sensitive     CEFTAZIDIME 2 SENSITIVE Sensitive     CIPROFLOXACIN <=0.25 SENSITIVE Sensitive     GENTAMICIN <=1 SENSITIVE Sensitive     IMIPENEM 1 SENSITIVE Sensitive     PIP/TAZO 8 SENSITIVE Sensitive     TOBRAMYCIN <=1 SENSITIVE Sensitive     * MODERATE PSEUDOMONAS AERUGINOSA   Methicillin resistant staphylococcus aureus - MIC*    CLINDAMYCIN >=8 RESISTANT Resistant     ERYTHROMYCIN >=8 RESISTANT Resistant     GENTAMICIN <=0.5 SENSITIVE Sensitive     LEVOFLOXACIN >=8 RESISTANT Resistant     OXACILLIN >=4 RESISTANT Resistant     PENICILLIN >=0.5 RESISTANT Resistant     RIFAMPIN <=0.5 SENSITIVE Sensitive     TRIMETH/SULFA <=10 SENSITIVE Sensitive     VANCOMYCIN 1 SENSITIVE Sensitive     TETRACYCLINE <=1 SENSITIVE Sensitive     * ABUNDANT METHICILLIN RESISTANT STAPHYLOCOCCUS AUREUS    Coagulation Studies:  Recent Labs  10/13/14 1605  LABPROT 15.1  INR 1.17    Imaging: Dg Chest Port 1 View  10/15/2014   CLINICAL DATA:  49 year old female currently admitted with a diagnosis of left lower lobe pneumonia  EXAM: PORTABLE CHEST - 1 VIEW  COMPARISON:  Prior chest x-ray 10/14/2014  FINDINGS: Tracheostomy tube tip remains midline and at the level of the clavicles. There is a left IJ approach central venous catheter with tip well positioned in the mid SVC. The nasogastric tube is incompletely imaged. The tip lies off the field of view, presumably within the stomach. Incompletely imaged surgical changes of ACDF at C6-C7.  Persistent left lower lobe airspace opacity and  superimposed volume loss. Slightly increased right basilar opacity. No pulmonary edema. No pneumothorax. No acute osseous abnormality. Mild pulmonary vascular congestion has improved compared to prior.  IMPRESSION: 1. Unchanged appearance of the left lower lobe infiltrate and/or atelectasis. 2. Slightly increased right basilar opacity favored to reflect atelectasis. 3. Slight interval decrease in pulmonary vascular congestion compared to the prior chest x-ray. 4. Stable and satisfactory support apparatus.   Electronically Signed   By: Jacqulynn Cadet M.D.   On: 10/15/2014 07:49   Dg Chest Port 1 View  10/14/2014   CLINICAL DATA:  New tracheostomy  EXAM: PORTABLE CHEST - 1 VIEW  COMPARISON:  Portable exam 1701 hr compared to 0643 hr  FINDINGS: New tracheostomy tube tip projecting over tracheal air column.  Nasogastric tube extends into stomach.  LEFT jugular central venous catheter  tip projects over SVC.  Normal heart size with slight pulmonary vascular congestion.  Atherosclerotic calcification aorta.  Mild RIGHT basilar atelectasis.  Probable small LEFT pleural effusion with atelectasis versus consolidation at LEFT lung base.  No pneumothorax.  IMPRESSION: No pneumothorax following tracheostomy placement.  Otherwise little changed.   Electronically Signed   By: Lavonia Dana M.D.   On: 10/14/2014 17:27       Assessment and plan per attending neurologist  Etta Quill PA-C [facility] [phone]  10/16/2014, 2:37 PM   Assessment/Plan:  49 YO female S/P small bowel obstruction, L brachial plexus bypass, 4 minute cardiac arrest now presenting with brief intermittent myoclonic activity very consistent with startle myoclonus, likely related to her hypoxic event. Will repeat EEG to rule out seizure activity.   Recommendations: 1) will obtain EEG. If EEG unremarkable no further neurological workup indicated  2)For symptomatic treatment can increase klonopin to 1mg  Q12hrs   Jim Like,  DO [facility] [phone]  If 7pm- 7am, please page neurology on call as listed in AMION.

## 2014-10-16 NOTE — Progress Notes (Signed)
STAT EEG completed; results pending. 

## 2014-10-16 NOTE — Progress Notes (Signed)
Patient ID: [name], female   DOB: June 18, 1966, 49 y.o.   MRN: [id number] Will make this patient a prn over the weekend.  We will recheck her wound on Monday.  Please call sooner if any surgical concerns.  Hanya Guerin E 10/16/2014 8:46 AM

## 2014-10-16 NOTE — Procedures (Signed)
ELECTROENCEPHALOGRAM REPORT Patient: [name]       Room #: 2S-12 EEG No. ID: [id number] Age: 49 y.o.        Sex: female Referring Physician: Dr Alva Garnet Report Date:  10/16/2014        Interpreting Physician: Jim Like, Larkin Ina  History: GARIELLE MROZ is an 49 y.o. female hospitalized for small bowel obstruction and then underwent a left brachial plexus bypass surgery. On 10-15-2023 coded for 4 minutes. During hospitalization patient was noted to have intermittent brief periods of bilateral UE and LE jerking. The jerking was most noted when tactile stimuli was given or patient was startled. EEG was obtained on 1/2 but due to computer difficulties final read was not placed. Patient continued to have such events and neurology was asked to see to rule out seizure versus myoclonus.   Medications:  Scheduled: . antiseptic oral rinse  7 mL Mouth Rinse QID  . aspirin  325 mg Per Tube Daily  . cefTAZidime (FORTAZ)  IV  1 g Intravenous 3 times per day  . chlorhexidine  15 mL Mouth Rinse BID  . citalopram  20 mg Per Tube Q1200  . clonazePAM  0.5 mg Per Tube Q12H  . enoxaparin (LOVENOX) injection  1 mg/kg Subcutaneous Q12H  . insulin aspart  0-9 Units Subcutaneous 6 times per day  . insulin glargine  10 Units Subcutaneous Daily  . micafungin [facility]) IV  100 mg Intravenous Q24H  . pantoprazole sodium  40 mg Per Tube Q1200  . sodium chloride  10-40 mL Intracatheter Q12H  . vancomycin  750 mg Intravenous Q12H    Conditions of Recording:  This is a 16 channel EEG carried out with the patient in the drowsy state.  Description:  The EEG is technically limited due to frequent sweat and motion artifact. The waking background activity consists of a low to medium voltage, symmetrical, poorly organized, theta and delta activity, seen from the parieto-occipital and posterior temporal regions.  Low voltage fast activity, poorly organized, is seen anteriorly and is at times superimposed on more  posterior regions.  A predominance of theta activity is seen from the central and temporal regions. There does not appear to be a well defined sleep or awake state.  No focal slowing, sharp wave or epileptiform activity is noted. Frequent myoclonic jerks are noted during the exam which do not appear to have an EEG correlate.   Hyperventilation and photic stimulation was not performed.     IMPRESSION: This is an abnormal EEG secondary to general background slowing.  This finding may be seen with a diffuse disturbance that is etiologically nonspecific. No epileptiform activity was noted.     Jim Like, DO [facility] [phone]  If 7pm- 7am, please page neurology on call as listed in AMION. 10/16/2014, 4:54 PM

## 2014-10-17 ENCOUNTER — Inpatient Hospital Stay (HOSPITAL_COMMUNITY): Payer: BLUE CROSS/BLUE SHIELD

## 2014-10-17 LAB — BASIC METABOLIC PANEL
Anion gap: 9 (ref 5–15)
BUN: 6 mg/dL (ref 6–23)
CALCIUM: 8.4 mg/dL (ref 8.4–10.5)
CHLORIDE: 101 meq/L (ref 96–112)
CO2: 31 mmol/L (ref 19–32)
Creatinine, Ser: 0.64 mg/dL (ref 0.50–1.10)
GFR calc Af Amer: >90 mL/min (ref >90)
GFR calc non Af Amer: >90 mL/min (ref >90)
Glucose, Bld: 130 mg/dL — ABNORMAL HIGH (ref 70–99)
Potassium: 3.1 mmol/L — ABNORMAL LOW (ref 3.5–5.1)
SODIUM: 141 mmol/L (ref 135–145)

## 2014-10-17 LAB — CBC
HCT: 28.3 % — ABNORMAL LOW (ref 36.0–46.0)
Hemoglobin: 8.5 g/dL — ABNORMAL LOW (ref 12.0–15.0)
MCH: 30.9 pg (ref 26.0–34.0)
MCHC: 30 g/dL (ref 30.0–36.0)
MCV: 102.9 fL — ABNORMAL HIGH (ref 78.0–100.0)
PLATELETS: 523 10*3/uL — AB (ref 150–400)
RBC: 2.75 MIL/uL — AB (ref 3.87–5.11)
RDW: 17.5 % — ABNORMAL HIGH (ref 11.5–15.5)
WBC: 12.5 10*3/uL — AB (ref 4.0–10.5)

## 2014-10-17 LAB — GLUCOSE, CAPILLARY
GLUCOSE-CAPILLARY: 115 mg/dL — AB (ref 70–99)
Glucose-Capillary: 122 mg/dL — ABNORMAL HIGH (ref 70–99)
Glucose-Capillary: 161 mg/dL — ABNORMAL HIGH (ref 70–99)
Glucose-Capillary: 121 mg/dL — ABNORMAL HIGH (ref 70–99)
Glucose-Capillary: 118 mg/dL — ABNORMAL HIGH (ref 70–99)

## 2014-10-17 MED ORDER — POTASSIUM CHLORIDE 10 MEQ/50ML IV SOLN
10.0000 meq | INTRAVENOUS | Status: AC
  Administered 2014-10-17 (×4): 10 meq via INTRAVENOUS
  Filled 2014-10-17 (×3): qty 50

## 2014-10-17 NOTE — Progress Notes (Signed)
PULMONARY / CRITICAL CARE MEDICINE HISTORY AND PHYSICAL EXAMINATION   Name: [name] MRN: [id number] DOB: Feb 08, 1966    ADMISSION DATE:  09/26/2014  PRIMARY SERVICE: PCCM  CHIEF COMPLAINT:  Abd pain  BRIEF PATIENT DESCRIPTION:  71yof smoker with PMH of MS, depression presented 12/19 on transfer from [facility] with abd pain, found to have pneumoperitoneum 2/2 small bowel rupture, also with new cold L hand.  Required surgery for DU perf & left brachial bypass, fungemia noted on admission cultures.  SIGNIFICANT EVENTS:  12/19 Admitted, CT Abd with pneumoperitoneum, cold L hand concerning for arterial clot as well. 12/20 ex-lap [name]) with omental patch for perforated prepyloric gastric ulcer; L brachial and ulnar artery embolectomy ([place]) 12/20 Left brachial to left radial artery bypass with left leg greater saphenous vein for recurrent occlusion  12/20 Self extubated - had to be reintubated Oct 29, 2023 reintubated, coded 4 minutes; CT head without acute change 01/01 High dose pressors, CT abdomen> no fluid collection noted; LLL pneumonia 01/03 PSV 4 hours, Hydrocortisone added, following simple commands, pressors improved, oxygenation improved 01/04 off pressors 01/06 Trach 01/08 Neurology consulted for startle myoclonus  STUDIES: 12/19 CT abd/pelvis >> ischemic proximal SB with pneumoperitoneum and ascites 12/20 CT angio chest >> thrombus Lt [place] artery 12/20 LE doppler >> negative 12/21 echo >> nml LVEF 12/26 CT abdomen >> pelvic fluid 12/28 TEE negative 10-29-23 CT head >> chronic infarcts of globus pallidi b/l 01/01 CT abd/pelvis >> no obstruction, perforation, or abscess 01/08 EEG >> background slowing, no epileptiform activity  SUBJECTIVE:  Tolerated vent weaning yesterday.  VITAL SIGNS: Temp:  [99.1 F (37.3 C)-101 F (38.3 C)] 100 F (37.8 C) (01/09 0406) Pulse Rate:  [58-138] 129 (01/09 0700) Resp:  [18-51] 34 (01/09 0700) BP: (121-156)/(66-106) 138/81 mmHg  (01/09 0700) SpO2:  [93 %-100 %] 97 % (01/09 0700) FiO2 (%):  [40 %] 40 % (01/09 0600) Weight:  [153 lb (69.4 kg)] 153 lb (69.4 kg) (01/09 0355) HEMODYNAMICS: CVP:  [2 mmHg-17 mmHg] 17 mmHg  Vent Mode:  [-] PRVC FiO2 (%):  [40 %] 40 % Set Rate:  [18 bmp] 18 bmp Vt Set:  [500 mL] 500 mL PEEP:  [5 cmH20] 5 cmH20 Plateau Pressure:  [18 cmH20-26 cmH20] 20 cmH20 INTAKE / OUTPUT: Intake/Output      01/08 0701 - 01/09 0700 01/09 0701 - 01/10 0700   I.V. (mL/kg) 400 (5.8)    Other     NG/GT 1410    IV Piggyback 600    Total Intake(mL/kg) 2410 (34.7)    Urine (mL/kg/hr) 2150 (1.3)    Stool 1 (0)    Total Output 2151     Net +259          Stool Occurrence 1 x      PHYSICAL EXAMINATION: Gen; awake on vent HEENT: trach site clean PULM: no wheeze CV: Tachy, no mgr Ab: BS+, midline scar well dressed EXT: warm, trace edema Neuro: awake, follows simple commands, answers orientation questions with nods appropriately  LABS: CBC Recent Labs     10/15/14  0500  10/16/14  0500  10/17/14  0411  WBC  13.0*  14.5*  12.5*  HGB  7.2*  8.0*  8.5*  HCT  23.7*  26.1*  28.3*  PLT  416*  474*  523*    BMET Recent Labs     10/15/14  0500  10/16/14  0500  10/17/14  0411  NA  145  142  141  K  3.0*  3.3*  3.1*  CL  107  102  101  CO2  32  33*  31  BUN  8  7  6   CREATININE  0.64  0.60  0.64  GLUCOSE  151*  165*  130*    Electrolytes Recent Labs     10/14/14  1800  10/15/14  0500  10/16/14  0500  10/17/14  0411  CALCIUM   --   7.6*  8.3*  8.4  MG  1.6   --   2.0   --     Glucose Recent Labs     10/16/14  0810  10/16/14  1149  10/16/14  1547  10/16/14  1939  10/16/14  2340  10/17/14  0345  GLUCAP  188*  146*  172*  147*  118*  122*    Imaging No results found.  ASSESSMENT / PLAN:  PULMONARY ETT 12/19 >> 12/29, 12/31 >> 1/06 Donata Duff 1/06 >> A:  Acute respiratory failure with hypoxemia > due to MRSA and Pseudomonas HCAP. 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 Hx of COPD. P:   Pressure support wean to trach collar as tolerated Trach care F/u CXR intermittently PRN BD's  CARDIOVASCULAR Lt IJ CVL 12/28 >> A: Septic Shock 1/1 2nd to HCAP >> resolved. Demand ischemia 1/1. Lt brachial/radial/ulnar artery thrombectomy. P:   Monitor hemodyamics Post-op care per VVS Full dose lovenox >> eventually will need to transition to oral anticoagulant Continue ASA  RENAL A: AKI 2nd to sepsis >> resolved. Hypokalemia. P:   Monitor BMET and UOP Replace electrolytes as needed   GASTROINTESTINAL A: Perforated gastric ulcer and pneumoperitoneum s/p laparotomy. Hx of GERD. P:   Post-op care per CCS Pantoprazole for stress ulcer prophylaxis Continue tube feeds  HEMATOLOGIC A: Anemia of critical illness. P:   F/u CBC Lovenox for DVT prevention  INFECTIOUS A: Peritonitis after bowel perforation 12/20 > Candida glabrata peritonitis and fungemia. Septic shock 1/1 > due to MRSA and pseudomonas HCAP. P:  Day 18/42 of micafungin for peritonitis, fungemia per ID Day 10/14 of Abx for HCAP >> Day 8 of vancomycin, day 5 fortaz per ID  Blood 12/19 >> Candida glabrata Peritoneal fluid 12/20 >> Candida glabrata Sputum 12/31 >> Pseudomonas aeruginosa, MRSA  ENDOCRINE A:  Hyperglycemia. Relative adrenal insufficiency >> stress steroid d/c'ed 1/08. P:   SSI with Lantus   NEUROLOGIC A: Post op pain Hx of multiple sclerosis, fibromyalgia, peripheral neuropathy, Guillian Barre syndrome. Acute encephalopathy after cardiac arrest 1/1 > improving. Startle myoclonus. P:   RASS goal 0 Fentanyl, versed prn Can adjust klonopin as needed to control startle myoclonus Continue celexa Hold outpt neurontin, flexeril  SUMMARY: No evidence for seizure activity >> appreciate neurology assessment.  If okay with VVS and CCS, then okay to transfer to [facility] when bed available.  [name], MD [facility] Pulmonary/Critical Care 10/17/2014, 8:04  AM Pager:  [phone] After 3pm call: [phone]

## 2014-10-17 NOTE — Progress Notes (Signed)
[facility] ADULT ICU REPLACEMENT PROTOCOL FOR AM LAB REPLACEMENT ONLY  The patient does apply for the [facility] Adult ICU Electrolyte Replacment Protocol based on the criteria listed below:   1. Is GFR >/= 40 ml/min? Yes.    Patient's GFR today is >90 2. Is urine output >/= 0.5 ml/kg/hr for the last 6 hours? Yes.   Patient's UOP is 0.78 ml/kg/hr 3. Is BUN < 60 mg/dL? Yes.    Patient's BUN today is 6 4. Abnormal electrolyte(s): Potassium 5. Ordered repletion with: Potassium per protocol    [name] P 10/17/2014 4:59 AM

## 2014-10-18 ENCOUNTER — Inpatient Hospital Stay (HOSPITAL_COMMUNITY): Payer: BLUE CROSS/BLUE SHIELD

## 2014-10-18 DIAGNOSIS — I998 Other disorder of circulatory system: Secondary | ICD-10-CM

## 2014-10-18 LAB — BASIC METABOLIC PANEL
ANION GAP: 10 (ref 5–15)
BUN: 8 mg/dL (ref 6–23)
CO2: 32 mmol/L (ref 19–32)
Calcium: 8.6 mg/dL (ref 8.4–10.5)
Chloride: 100 mEq/L (ref 96–112)
Creatinine, Ser: 0.55 mg/dL (ref 0.50–1.10)
Glucose, Bld: 145 mg/dL — ABNORMAL HIGH (ref 70–99)
Potassium: 2.8 mmol/L — ABNORMAL LOW (ref 3.5–5.1)
SODIUM: 142 mmol/L (ref 135–145)

## 2014-10-18 LAB — CBC
HCT: 28.5 % — ABNORMAL LOW (ref 36.0–46.0)
Hemoglobin: 8.5 g/dL — ABNORMAL LOW (ref 12.0–15.0)
MCH: 30.7 pg (ref 26.0–34.0)
MCHC: 29.8 g/dL — ABNORMAL LOW (ref 30.0–36.0)
MCV: 102.9 fL — ABNORMAL HIGH (ref 78.0–100.0)
PLATELETS: 532 10*3/uL — AB (ref 150–400)
RBC: 2.77 MIL/uL — AB (ref 3.87–5.11)
RDW: 17.2 % — ABNORMAL HIGH (ref 11.5–15.5)
WBC: 12.2 10*3/uL — ABNORMAL HIGH (ref 4.0–10.5)

## 2014-10-18 LAB — GLUCOSE, CAPILLARY
Glucose-Capillary: 141 mg/dL — ABNORMAL HIGH (ref 70–99)
Glucose-Capillary: 146 mg/dL — ABNORMAL HIGH (ref 70–99)

## 2014-10-18 MED ORDER — INSULIN ASPART 100 UNIT/ML ~~LOC~~ SOLN
0.0000 [IU] | SUBCUTANEOUS | Status: DC
Start: 2014-10-18 — End: 2014-12-10

## 2014-10-18 MED ORDER — SODIUM CHLORIDE 0.9 % IJ SOLN: 10.0000 mL | Freq: Two times a day (BID) | INTRAMUSCULAR | Status: DC

## 2014-10-18 MED ORDER — PANTOPRAZOLE SODIUM 40 MG PO PACK: 40.0000 mg | PACK | Freq: Every day | ORAL | Status: DC

## 2014-10-18 MED ORDER — ENOXAPARIN SODIUM 80 MG/0.8ML ~~LOC~~ SOLN: 70.0000 mg | Freq: Two times a day (BID) | SUBCUTANEOUS | Status: DC

## 2014-10-18 MED ORDER — MIDAZOLAM HCL 2 MG/2ML IJ SOLN
0.5000 mg | INTRAMUSCULAR | Status: DC | PRN
Start: 2014-10-18 — End: 2014-12-10

## 2014-10-18 MED ORDER — DEXTROSE 5 % IV SOLN: 1.0000 g | Freq: Three times a day (TID) | INTRAVENOUS | Status: DC

## 2014-10-18 MED ORDER — ACETAMINOPHEN 160 MG/5ML PO SOLN: 650.0000 mg | Freq: Four times a day (QID) | ORAL | Status: DC | PRN

## 2014-10-18 MED ORDER — POTASSIUM CHLORIDE 20 MEQ/15ML (10%) PO SOLN: 40.0000 meq | ORAL | Status: DC

## 2014-10-18 MED ORDER — SODIUM CHLORIDE 0.9 % IJ SOLN: 10.0000 mL | INTRAMUSCULAR | Status: DC | PRN

## 2014-10-18 MED ORDER — INSULIN GLARGINE 100 UNIT/ML ~~LOC~~ SOLN: 10.0000 [IU] | Freq: Every day | SUBCUTANEOUS | Status: DC

## 2014-10-18 MED ORDER — VITAL AF 1.2 CAL PO LIQD: 1000.0000 mL | ORAL | Status: DC

## 2014-10-18 MED ORDER — LABETALOL HCL 5 MG/ML IV SOLN: 20.0000 mg | INTRAVENOUS | Status: DC | PRN

## 2014-10-18 MED ORDER — GERHARDT'S BUTT CREAM: 1.0000 "application " | TOPICAL_CREAM | CUTANEOUS | Status: DC | PRN

## 2014-10-18 MED ORDER — SODIUM CHLORIDE 0.9 % IV SOLN: 100.0000 mg | INTRAVENOUS | Status: DC

## 2014-10-18 MED ORDER — POTASSIUM CHLORIDE 10 MEQ/50ML IV SOLN
10.0000 meq | INTRAVENOUS | Status: DC
  Administered 2014-10-18 (×4): 10 meq via INTRAVENOUS
  Filled 2014-10-18: qty 50

## 2014-10-18 MED ORDER — VANCOMYCIN HCL IN DEXTROSE 750-5 MG/150ML-% IV SOLN: 750.0000 mg | Freq: Two times a day (BID) | INTRAVENOUS | Status: DC

## 2014-10-18 MED ORDER — CETYLPYRIDINIUM CHLORIDE 0.05 % MT LIQD: 7.0000 mL | Freq: Four times a day (QID) | OROMUCOSAL | Status: DC

## 2014-10-18 MED ORDER — IPRATROPIUM-ALBUTEROL 0.5-2.5 (3) MG/3ML IN SOLN: 3.0000 mL | RESPIRATORY_TRACT | Status: DC | PRN

## 2014-10-18 MED ORDER — CHLORHEXIDINE GLUCONATE 0.12 % MT SOLN: 15.0000 mL | Freq: Two times a day (BID) | OROMUCOSAL | Status: DC

## 2014-10-18 MED ORDER — ONDANSETRON HCL 4 MG/2ML IJ SOLN
4.0000 mg | Freq: Four times a day (QID) | INTRAMUSCULAR | Status: DC | PRN
Start: 2014-10-18 — End: 2014-12-10

## 2014-10-18 MED ORDER — POTASSIUM CHLORIDE 20 MEQ/15ML (10%) PO SOLN
40.0000 meq | ORAL | Status: AC
  Administered 2014-10-18 (×3): 40 meq
  Filled 2014-10-18 (×4): qty 30

## 2014-10-18 MED ORDER — SODIUM CHLORIDE 0.9 % IV SOLN: INTRAVENOUS | Status: DC

## 2014-10-18 MED ORDER — FENTANYL CITRATE 0.05 MG/ML IJ SOLN: 25.0000 ug | INTRAMUSCULAR | Status: DC | PRN

## 2014-10-18 MED ORDER — ASPIRIN 325 MG PO TABS: 325.0000 mg | ORAL_TABLET | Freq: Every day | ORAL | Status: DC

## 2014-10-18 MED ORDER — ENOXAPARIN SODIUM 80 MG/0.8ML ~~LOC~~ SOLN
70.0000 mg | Freq: Two times a day (BID) | SUBCUTANEOUS | Status: DC
  Filled 2014-10-18 (×2): qty 0.8

## 2014-10-18 NOTE — Progress Notes (Signed)
ANTIBIOTIC CONSULT NOTE - FOLLOW UP ANTICOAGULATION NOTE - FOLLOW UP  Pharmacy Consult for Ceftazidime/Vancomycin;Lovenox Indication: PSA and MRSA PNA; chronic brachial artery thrombus  Allergies  Allergen Reactions  . Ambien [Zolpidem Tartrate] Other (See Comments)    Crazy dreams  . Ceftin [Cefuroxime Axetil] Nausea And Vomiting    Pt tolerated Cefepime 09/2014 admission  . Hctz [Hydrochlorothiazide] Other (See Comments)    Urinary retention  . Hydrocodone Nausea Only  . Lodine [Etodolac] Hives  . Promethazine Other (See Comments)    Hallucinations.   . Roxicodone [Oxycodone Hcl] Swelling  . Codeine Rash  . Latex Rash  . Penicillins Rash   Patient Measurements: Height: 4\' 9"  (144.8 cm) Weight: 159 lb 9.8 oz (72.4 kg) IBW/kg (Calculated) : 38.6  Vital Signs: Temp: 98.8 F (37.1 C) (01/10 0746) Temp Source: Oral (01/10 0746) BP: 147/92 mmHg (01/10 1100) Pulse Rate: 104 (01/10 1100) Intake/Output from previous day: 01/09 0701 - 01/10 0700 In: 2480 [I.V.:360; NG/GT:1420; IV Piggyback:700] Out: 1865 [Urine:1815; Stool:50] Intake/Output from this shift: Total I/O In: 460 [I.V.:80; NG/GT:280; IV Piggyback:100] Out: 450 [Urine:450]  Labs:  Recent Labs  10/16/14 0500 10/17/14 0411 10/18/14 0330  WBC 14.5* 12.5* 12.2*  HGB 8.0* 8.5* 8.5*  PLT 474* 523* 532*  CREATININE 0.60 0.64 0.55   Estimated Creatinine Clearance: 70 mL/min (by C-G formula based on Cr of 0.55).  Assessment: 48yof continues on IV vancomycin, ceftazidime and micafungin for MRSA and pseudomonas pneumonia as well as candida glabrata of blood and peritoneal culture. She remains afebrile and WBC today is 12.2K.  Her last imaging was on 1/9 which showed improved aeration of lungs.  The plan is to continue Micafungin for 6 weeks and 2 weeks of therapy for Vancomycin and Ceftazidime.  Renal function remains stable with creatinine of 0.55 and an est. crcl of 56ml/min.  She continues to have good UOP  (57ml/kg/hr).  Vanc 12/19>>12/25; 1/1>>....(1/15) Cefepime 12/19>>12/25 Mica 12/23>>..... (2/10) Rocephin 12/31>>1/1 Flagyl 12/31>>1/3 Meropenem 1/1>>1/3 Ceftaz 1/4 >>...(1/18)  12/19 Urine -NEG 12/19 Blood - 2/2 candida glabrata 12/20 Peritoneal fluid - candida glabrata (usually azole resistant) 12/24 BCx2 - NEG 12/29 Urine - NEG 12/30 Cdiff PCR - NEG 12/31 BCx2 - 1/2 CNS 12/31 TA abundant MRSA+, moderate PSA  Also continues on full dose lovenox for chronic brachial artery thrombus. Weight has fluctuated between 65kg to 79kg -  so will adjust lovenox dose. Hgb is low at 8.5, platelets ok, no bleeding reported.  Will need oral anticoagulation once more stable.  Plan is to transfer to [facility].  Goal of Therapy:  Vancomycin trough level 15-20 mcg/ml  Anti-Xa level 0.6-1.2 4 hours after LMWH given Monitor platelets by anticoagulation protocol: Yes  Plan:  1) Continue vancomycin 750mg  IV q12 2) Continue ceftazidime 1g IV q8  3) Change lovenox to 70mg  sq q12  Rober Minion, PharmD., MS Clinical Pharmacist Pager:  [phone] Thank you for allowing pharmacy to be part of this patients care team.  10/18/2014,12:32 PM

## 2014-10-18 NOTE — Progress Notes (Signed)
Report called to RN for [facility] room 206 .[facility] notified for transport.[name] and family informed of transfer

## 2014-10-18 NOTE — Progress Notes (Signed)
[facility] ADULT ICU REPLACEMENT PROTOCOL FOR AM LAB REPLACEMENT ONLY  The patient does apply for the [facility] Adult ICU Electrolyte Replacment Protocol based on the criteria listed below:   1. Is GFR >/= 40 ml/min? Yes.    Patient's GFR today is >90 2. Is urine output >/= 0.5 ml/kg/hr for the last 6 hours? Yes.   Patient's UOP is 1.04 ml/kg/hr 3. Is BUN < 60 mg/dL? Yes.    Patient's BUN today is 8 4. Abnormal electrolyte(s): Potassium 5. Ordered repletion with: Potassium per Protocol    [name] P 10/18/2014 5:05 AM

## 2014-10-18 NOTE — Progress Notes (Signed)
Peripherally Inserted Central Catheter/Midline Placement  The IV Nurse has discussed with the patient and/or persons authorized to consent for the patient, the purpose of this procedure and the potential benefits and risks involved with this procedure.  The benefits include less needle sticks, lab draws from the catheter and patient may be discharged home with the catheter.  Risks include, but not limited to, infection, bleeding, blood clot (thrombus formation), and puncture of an artery; nerve damage and irregular heat beat.  Alternatives to this procedure were also discussed.  PICC/Midline Placement Documentation  PICC / Midline Double Lumen 44/96/75 PICC Right Basilic 33 cm 0 cm (Active)  Indication for Insertion or Continuance of Line Limited venous access - need for IV therapy >5 days (PICC only) 10/18/2014  1:10 PM  Exposed Catheter (cm) 0 cm 10/18/2014  1:10 PM  Site Assessment Clean;Dry;Intact 10/18/2014  1:10 PM  Lumen #1 Status Flushed;Saline locked;Blood return noted 10/18/2014  1:10 PM  Lumen #2 Status Flushed;Saline locked;Blood return noted 10/18/2014  1:10 PM  Dressing Change Due 10/25/14 10/18/2014  1:10 PM       Gordan Payment 10/18/2014, 1:12 PM

## 2014-10-18 NOTE — Progress Notes (Signed)
Pt transferred to [facility] via [facility] report given

## 2014-10-18 NOTE — Discharge Summary (Signed)
Physician Discharge Summary       Patient ID: [name] MRN: [id number] DOB/AGE: 09-Aug-1966 49 y.o.  Admit date: 09/26/2014 Discharge date: 10/18/2014  Discharge Diagnoses:  Acute Hypoxic respiratory failure MRSA and Pseudomonas Pneumonia  Difficult airway COPD Tracheostomy status Septic shock (resolved) Abdominal peritonitis  Fungemia  Demand cardiac ischemia Left brachial artery/radial and ulnar artery thrombectomy AKI (resolved) Perforated gastric ulcer with resultant Pneumoperitoneum s/p exploratory lap and repair GERD Anemia of critical illness  Relative adrenal insufficiency Multiple sclerosis Fibromyalgia  H/o GBS Peripheral Neuropathy Startle Myoclonus Physical deconditioning     Detailed Hospital Course:  49yof with PMH of MS, depression presents 12/20  with progressively worsening abd pain x 1 days, found to have pneumoperitoneum 2/2 small bowel rupture. She was in her usual state of health until previous 24 hrs. During this time she has had worsening diffuse, severe sharp abd pain. Has started vomiting over past 12 hrs as well. +Fevers. No recent surgeries. Last BM the day prior. No trauma. Given her worsening abd pain, she ultimately presented to [facility] for further eval and had CT as noted below. Has a hx of a trach/peg 2/2 prolonged ICU stay for PNA one year ago but no other abd surgeries. While in the ED, she also developed duskiness and coolness of the L hand with noted lack of radial or ulner arterial pulses concerning for acute clot. Vascular and trauma surg have evaluated and she was sent to the OR.  The highlights of her hospital course were as follows:  12/19 Admitted, CT Abd with pneumoperitoneum, cold L hand concerning for arterial clot as well. 12/20 ex-lap [name]) with omental patch for perforated prepyloric gastric ulcer; L brachial and ulnar artery embolectomy ([name]) 12/20 Left brachial to left radial artery bypass with left leg  greater saphenous vein for recurrent occlusion  12/20 Self extubated - had to be reintubated.  11/03/2023 reintubated, coded 4 minutes; CT head without acute change 01/01 High dose pressors, CT abdomen> no fluid collection noted; LLL pneumonia.  01/03 PSV 4 hours, Hydrocortisone added, following simple commands, pressors improved, oxygenation improved 01/04 off pressors 01/06 Trach 01/08 Neurology consulted for startle myoclonus. 01/08 EEG >> background slowing, no epileptiform activity 1/10. Ready to transfer to [facility] for on-going care as outlined below per her active problem list.    Discharge Plan by active problems  Acute respiratory failure w/ Hypoxemia  MRSA and Pseudomonas PNA Tracheostomy status Plan/recs:  Cont weaning efforts  Routine trach care   Wean FIO2  PRN bronchodilators  She is to complete 14 days of Fortaz and Vanc (start days for these were: 1/4 for fortaz;  and 1/  1/5 for vanc).   Peritonitis s/p bowel perforation: Candida glabrata peritonitis and fungemia Plan/recs:  continue micafungin for 6 weeks with day 1 being 10/01/2014  she still DOES NEED a dedicated Dilated funduscopic exam performed by an ophthalmologist  to ensure no fungal endophthalmitis. This could be set up after acute illness resolved.   Demand ischemia 1/1. S/p Lt brachial/radial/ulnar artery thrombectomy. Plan/recs  Full dose lovenox >> eventually will need to transition to oral anticoagulant  Continue ASA  Resolved AKI Hypokalemia. Plan/rec:   Monitor BMET and UOP  Replace electrolytes as needed  Perforated gastric ulcer and pneumoperitoneum s/p laparotomy. Hx of GERD. Plan/rec:   Post-op care per CCS  Pantoprazole for stress ulcer prophylaxis  Continue tube feeds  Hx of multiple sclerosis, fibromyalgia, peripheral neuropathy, Guillian Barre syndrome. Acute encephalopathy after cardiac arrest  1/1 > improving. Startle myoclonus. Deconditioning. Plan/rec:   RASS goal  0  Fentanyl, versed prn  Can adjust klonopin as needed to control startle myoclonus  Continue celexa  Hold outpt neurontin, flexeril  PT assessment  Hyperglycemia. Relative adrenal insufficiency >> stress steroid d/c'ed 1/08. Plan/rec:   SSI with Lantus Significant Hospital tests/ studies  Consults: general surgery: [name]  12/19 CT abd/pelvis >> ischemic proximal SB with pneumoperitoneum and ascites 12/20 CT angio chest >> thrombus Lt [name] artery 12/20 LE doppler >> negative 12/21 echo >> nml LVEF 12/26 CT abdomen >> pelvic fluid 12/28 TEE negative 12/31 CT head >> chronic infarcts of globus pallidi b/l 01/01 CT abd/pelvis >> no obstruction, perforation, or abscess 01/08 EEG >> background slowing, no epileptiform activity Discharge Exam: BP 149/104 mmHg  Pulse 97  Temp(Src) 99.4 F (37.4 C) (Oral)  Resp 18  Ht 4\' 9"  (1.448 m)  Wt 72.4 kg (159 lb 9.8 oz)  BMI 34.53 kg/m2  SpO2 98%  Gen; awake on vent HEENT: trach site clean PULM: no wheeze CV: Tachy, no mgr Ab: BS+, midline scar well dressed EXT: warm, trace edema Neuro: awake, follows simple commands, answers orientation questions with nods appropriately  Labs at discharge Lab Results  Component Value Date   CREATININE 0.55 10/18/2014   BUN 8 10/18/2014   NA 142 10/18/2014   K 2.8* 10/18/2014   CL 100 10/18/2014   CO2 32 10/18/2014   Lab Results  Component Value Date   WBC 12.2* 10/18/2014   HGB 8.5* 10/18/2014   HCT 28.5* 10/18/2014   MCV 102.9* 10/18/2014   PLT 532* 10/18/2014   Lab Results  Component Value Date   ALT 226* 10/11/2014   AST 294* 10/11/2014   ALKPHOS 128* 10/11/2014   BILITOT 0.7 10/11/2014   Lab Results  Component Value Date   INR 1.17 10/13/2014   INR 1.25 10/05/2014    Current radiology studies Dg Chest Port 1 View  10/18/2014   CLINICAL DATA:  Evaluate line placement.  EXAM: PORTABLE CHEST - 1 VIEW  COMPARISON:  Chest radiograph 10/17/2014  FINDINGS: Right  upper extremity PICC line is present with tip projecting over the superior cavoatrial junction. Left internal jugular central venous catheter tip projects over the superior cavoatrial junction. Tracheostomy tube stable in position. Enteric tube courses inferior to the diaphragm. Stable cardiac and mediastinal contours. Minimal heterogeneous opacities left lung base. Trace left pleural effusion. No pneumothorax.  IMPRESSION: Support apparatus as above.  Heterogeneous opacities left lung base, potentially secondary to improving infection.  Trace left pleural effusion.   Electronically Signed   By: Lovey Newcomer M.D.   On: 10/18/2014 14:38   Dg Chest Port 1 View  10/17/2014   CLINICAL DATA:  49 year old female with healthcare associated pneumonia.  EXAM: PORTABLE CHEST - 1 VIEW  COMPARISON:  Chest x-ray 10/15/2014.  FINDINGS: A tracheostomy tube is in place with tip 6.5 cm above the carina. There is a left-sided internal jugular central venous catheter with tip terminating in the mid superior vena cava. A nasogastric tube is seen extending into the stomach, however, the tip of the nasogastric tube extends below the lower margin of the image. Lung volumes are low. Patchy ill-defined opacities in the lower lungs bilaterally (left greater than right), slightly improved compared to the prior examination, favored to reflect resolving multilobar bronchopneumonia. Probable trace left pleural effusion. No evidence of pulmonary edema. Heart size is normal. Upper mediastinal contours are unremarkable. Orthopedic fixation hardware in  the lower cervical spine incidentally noted.  IMPRESSION: 1. Support apparatus, as above. 2. Slightly improving aeration in the lower lobes of the lungs bilaterally, favored to reflect resolving multilobar bronchopneumonia. 3. Trace left pleural effusion.   Electronically Signed   By: Vinnie Langton M.D.   On: 10/17/2014 13:54    Disposition:  01-Home or Self Care      Discharge  Instructions    Diet: tubefeeds  Complete by:  As directed      Increase activity slowly    Complete by:  As directed             Medication List    STOP taking these medications        cyclobenzaprine 10 MG tablet  Commonly known as:  FLEXERIL     folic acid 1 MG tablet  Commonly known as:  FOLVITE     gabapentin 300 MG capsule  Commonly known as:  NEURONTIN     meloxicam 15 MG tablet  Commonly known as:  MOBIC     multivitamin with minerals Tabs tablet     nicotine 14 mg/24hr patch  Commonly known as:  NICODERM CQ - dosed in mg/24 hours     oxyCODONE-acetaminophen 10-325 MG per tablet  Commonly known as:  PERCOCET     potassium chloride 10 MEQ tablet  Commonly known as:  K-DUR  Replaced by:  potassium chloride 20 MEQ/15ML (10%) Soln     thiamine 100 MG tablet  Commonly known as:  VITAMIN B-1     vitamin B-12 1000 MCG tablet  Commonly known as:  CYANOCOBALAMIN      TAKE these medications        acetaminophen 160 MG/5ML solution  Commonly known as:  TYLENOL  Place 20.3 mLs (650 mg total) into feeding tube every 6 (six) hours as needed for mild pain, headache or fever.     albuterol (2.5 MG/3ML) 0.083% nebulizer solution  Commonly known as:  PROVENTIL  Take 2.5 mg by nebulization every 4 (four) hours as needed for wheezing or shortness of breath (copd).     antiseptic oral rinse 0.05 % Liqd solution  Commonly known as:  CPC / CETYLPYRIDINIUM CHLORIDE 0.05%  7 mLs by Mouth Rinse route QID.     aspirin 325 MG tablet  Place 1 tablet (325 mg total) into feeding tube daily.     cefTAZidime 1 g in dextrose 5 % 50 mL  Inject 1 g into the vein every 8 (eight) hours.     chlorhexidine 0.12 % solution  Commonly known as:  PERIDEX  15 mLs by Mouth Rinse route 2 (two) times daily.     citalopram 20 MG tablet  Commonly known as:  CELEXA  Take 20 mg by mouth daily at 12 noon.     clonazePAM 0.5 MG tablet  Commonly known as:  KLONOPIN  Take 0.5 mg by mouth 2  (two) times daily as needed for anxiety.     enoxaparin 80 MG/0.8ML injection  Commonly known as:  LOVENOX  Inject 0.7 mLs (70 mg total) into the skin every 12 (twelve) hours.     feeding supplement (VITAL AF 1.2 CAL) Liqd  Place 1,000 mLs into feeding tube continuous.     fentaNYL 0.05 MG/ML injection  Commonly known as:  SUBLIMAZE  Inject 0.5-2 mLs (25-100 mcg total) into the vein every 2 (two) hours as needed (sedation, RASS 0).     Gerhardt's butt cream Crea  Apply 1 application  topically as needed for irritation.     insulin aspart 100 UNIT/ML injection  Commonly known as:  novoLOG  Inject 0-9 Units into the skin every 4 (four) hours.     insulin glargine 100 UNIT/ML injection  Commonly known as:  LANTUS  Inject 0.1 mLs (10 Units total) into the skin daily.     ipratropium-albuterol 0.5-2.5 (3) MG/3ML Soln  Commonly known as:  DUONEB  Take 3 mLs by nebulization every 2 (two) hours as needed.     labetalol 5 MG/ML injection  Commonly known as:  NORMODYNE,TRANDATE  Inject 4 mLs (20 mg total) into the vein every 10 (ten) minutes as needed (Until SBP < 170 mmHg).     micafungin 100 mg in sodium chloride 0.9 % 100 mL  Inject 100 mg into the vein daily.     midazolam 2 MG/2ML Soln injection  Commonly known as:  VERSED  Inject 0.5-1 mLs (0.5-1 mg total) into the vein every 4 (four) hours as needed for sedation (sedation).     ondansetron 4 MG/2ML Soln injection  Commonly known as:  ZOFRAN  Inject 2 mLs (4 mg total) into the vein every 6 (six) hours as needed for nausea.     pantoprazole sodium 40 mg/20 mL Pack  Commonly known as:  PROTONIX  Place 20 mLs (40 mg total) into feeding tube daily at 12 noon.     potassium chloride 20 MEQ/15ML (10%) Soln  Place 30 mLs (40 mEq total) into feeding tube every 3 (three) hours.     sodium chloride 0.9 % infusion  kvo     sodium chloride 0.9 % injection  10-40 mLs by Intracatheter route every 12 (twelve) hours.     Vancomycin  750 MG/150ML Soln  Commonly known as:  VANCOCIN  Inject 150 mLs (750 mg total) into the vein every 12 (twelve) hours.         Discharged Condition: fair  Physician Statement:   The Patient was personally examined, the discharge assessment and plan has been personally reviewed and I agree with ACNP Babcock's assessment and plan. > 30 minutes of time have been dedicated to discharge assessment, planning and discharge instructions.   SignedMarni Griffon 10/18/2014, 2:50 PM  [name], MD [facility] Pulmonary/Critical Care 10/18/2014, 3:09 PM Pager:  [phone] After 3pm call: [phone]

## 2014-10-18 NOTE — Progress Notes (Signed)
Pt unavailable at time of vent check due to PICC line insertion. RT will continue to monitor.

## 2014-10-18 NOTE — Care Management (Signed)
CARE MANAGEMENT NOTE 10/18/2014  Patient:  [name], [name]   Account Number:  [id number]  Date Initiated:  09/30/2014  Documentation initiated by:  MAYO,HENRIETTA  Subjective/Objective Assessment:   transferred from [facility] with dx of pneumoperitoneum/ischemic (L) hand     Action/Plan:   Anticipated DC Date:  10/16/2014   Anticipated DC Plan:  LONG TERM ACUTE CARE (LTAC)      DC Planning Services  CM consult      Choice offered to / List presented to:             Status of service:  Completed, signed off Medicare Important Message given?  YES (If response is "NO", the following Medicare IM given date fields will be blank) Date Medicare IM given:  10/15/2014 Medicare IM given by:  [facility] Date Additional Medicare IM given:   Additional Medicare IM given by:    Discharge Disposition:  LONG TERM ACUTE CARE (LTAC)  Per UR Regulation:  Reviewed for med. necessity/level of care/duration of stay  If discussed at [place] of Stay Meetings, dates discussed:   10/06/2014  10/13/2014  10/15/2014    Comments:  10/18/14 CM received call from RN, [name], stating pt has discharge order.  CM called [facility] and received callback from [place].  Pt to go to Room 206.  [name] to call report to [phone] ext [phone] and receiving MD is [name].  CM ran transfer report, EMTALA, DC summary and gave RN aforementioned information.  [name] states she can call [facility] after she calls report.  No other CM needs were communicated.  [name], BSN, [place]. 10/17/14 10:00 CM received call from [name] stating bed available only through sunday 1/10 and then insurance authorization will have to begin agin on Monday to secure another be.  RN made aware.  MD made aware (via note).  Will continue to monitor for stability of transfer. [name], BSN, CM [phone].  Contact:  [name],[name] Significant other [place]    [name], [name] Son  [phone]  Erlene Quan - son - [phone]  10/16/13 0840 Babette Relic RN MSN BSN CCM Notified by [employer] that insurance has authorized transfer - informed attending and RN caring for pt.  Per attending, surgery has not signed off and pt not appropriate for transfer.  10-15-14  1:30pm Luz Lex, RNBSN - [phone] [facility] sending clinicals for insurance approval.  Left message for son. Daughter in law [name] called back.  Updated.  Discussed Long term acute care units.  Verified that her insurance primary is [employer] which is in network for [facility].  She did have her flu shot in October of 2015.  Prior to admission was at home with SO and mostly independent.  She does have a walker, but doesnt use now much.  Occasionally needs assistance due let pain, but mostly independent.  10-14-14   11:15am Luz Lex, RNBSN [phone] Plan for trach this afternoon.  Ltach referral placed.  In network at [facility].  [facility] [place] notified.  Physician states probably not ready tomorrow.  Will follow and talk with son tomorrow.  10-13-14 10:45am [place] [phone] Remains intubated - off pressors - looking at trach the end of the week.  CM will continue to follow.   Will continue with Ltach plan - [facility] - that is in her insurance network.  Intubated - sedated - unable to talk.  10/08/14 [place] MSN BSN CCM Pt with O2 sat of 72%  on 4L/min [place], HR 120s-130s, placed on BiPAP. 1330 Respiratory arrest 2 minutes, requiring CPR and epinephrine 1 with prompt return of pulse and circulation. Pt intubated.  10/07/14 [place] MSN BSN CCM Pt has LTAC benefit and [facility] is in network.  Discussed potential plan with SO, [name] [phone]), who agrees.  When pt is medically stable for transfer, [facility] will request authorization from [facility].

## 2014-10-18 NOTE — Progress Notes (Signed)
PULMONARY / CRITICAL CARE MEDICINE HISTORY AND PHYSICAL EXAMINATION   Name: [name] MRN: [id number] DOB: 1966-06-20    ADMISSION DATE:  09/26/2014  PRIMARY SERVICE: PCCM  CHIEF COMPLAINT:  Abd pain  BRIEF PATIENT DESCRIPTION:  49yof smoker with PMH of MS, depression presented 12/19 on transfer from [facility] with abd pain, found to have pneumoperitoneum 2/2 small bowel rupture, also with new cold L hand.  Required surgery for DU perf & left brachial bypass, fungemia noted on admission cultures.  SIGNIFICANT EVENTS:  12/19 Admitted, CT Abd with pneumoperitoneum, cold L hand concerning for arterial clot as well. 12/20 ex-lap [name]) with omental patch for perforated prepyloric gastric ulcer; L brachial and ulnar artery embolectomy ([place]) 12/20 Left brachial to left radial artery bypass with left leg greater saphenous vein for recurrent occlusion  12/20 Self extubated - had to be reintubated 2023/11/02 reintubated, coded 4 minutes; CT head without acute change 01/01 High dose pressors, CT abdomen> no fluid collection noted; LLL pneumonia 01/03 PSV 4 hours, Hydrocortisone added, following simple commands, pressors improved, oxygenation improved 01/04 off pressors 01/06 Trach 01/08 Neurology consulted for startle myoclonus  STUDIES: 12/19 CT abd/pelvis >> ischemic proximal SB with pneumoperitoneum and ascites 12/20 CT angio chest >> thrombus Lt [place] artery 12/20 LE doppler >> negative 12/21 echo >> nml LVEF 12/26 CT abdomen >> pelvic fluid 12/28 TEE negative 11/02/23 CT head >> chronic infarcts of globus pallidi b/l 01/01 CT abd/pelvis >> no obstruction, perforation, or abscess 01/08 EEG >> background slowing, no epileptiform activity  SUBJECTIVE:  Increased to 50% FiO2 today.  VITAL SIGNS: Temp:  [98.3 F (36.8 C)-100 F (37.8 C)] 98.8 F (37.1 C) (01/10 0746) Pulse Rate:  [93-120] 107 (01/10 0900) Resp:  [14-46] 20 (01/10 0900) BP: (113-151)/(62-108) 123/77 mmHg (01/10  0900) SpO2:  [85 %-100 %] 85 % (01/10 0900) FiO2 (%):  [40 %-60 %] 40 % (01/10 0600) Weight:  [159 lb 9.8 oz (72.4 kg)-160 lb 15 oz (73 kg)] 159 lb 9.8 oz (72.4 kg) (01/10 0300) HEMODYNAMICS: CVP:  [7 mmHg-19 mmHg] 7 mmHg  Vent Mode:  [-] PRVC FiO2 (%):  [40 %-60 %] 40 % Set Rate:  [18 bmp] 18 bmp Vt Set:  [500 mL] 500 mL PEEP:  [5 cmH20] 5 cmH20 Pressure Support:  [10 cmH20-14 cmH20] 14 cmH20 Plateau Pressure:  [9 cmH20-19 cmH20] 19 cmH20 INTAKE / OUTPUT: Intake/Output      01/09 0701 - 01/10 0700 01/10 0701 - 01/11 0700   I.V. (mL/kg) 360 (5) 40 (0.6)   NG/GT 1420 130   IV Piggyback 700 100   Total Intake(mL/kg) 2480 (34.3) 270 (3.7)   Urine (mL/kg/hr) 1815 (1) 250 (1.5)   Stool 50 (0)    Total Output 1865 250   Net +615 +20        Stool Occurrence 1 x      PHYSICAL EXAMINATION: Gen; awake on vent HEENT: trach site clean PULM: no wheeze CV: Tachy, no mgr Ab: BS+, midline scar well dressed EXT: warm, trace edema Neuro: awake, follows simple commands, answers orientation questions with nods appropriately  LABS: CBC Recent Labs     10/16/14  0500  10/17/14  0411  10/18/14  0330  WBC  14.5*  12.5*  12.2*  HGB  8.0*  8.5*  8.5*  HCT  26.1*  28.3*  28.5*  PLT  474*  523*  532*    BMET Recent Labs     10/16/14  0500  10/17/14  0411  10/18/14  0330  NA  142  141  142  K  3.3*  3.1*  2.8*  CL  102  101  100  CO2  33*  31  32  BUN  7  6  8   CREATININE  0.60  0.64  0.55  GLUCOSE  165*  130*  145*    Electrolytes Recent Labs     10/16/14  0500  10/17/14  0411  10/18/14  0330  CALCIUM  8.3*  8.4  8.6  MG  2.0   --    --     Glucose Recent Labs     10/17/14  0345  10/17/14  0804  10/17/14  1137  10/17/14  1622  10/17/14  2041  10/18/14  0743  GLUCAP  122*  161*  121*  115*  118*  141*    Imaging Dg Chest Port 1 View  10/17/2014   CLINICAL DATA:  49 year old female with healthcare associated pneumonia.  EXAM: PORTABLE CHEST - 1 VIEW   COMPARISON:  Chest x-ray 10/15/2014.  FINDINGS: A tracheostomy tube is in place with tip 6.5 cm above the carina. There is a left-sided internal jugular central venous catheter with tip terminating in the mid superior vena cava. A nasogastric tube is seen extending into the stomach, however, the tip of the nasogastric tube extends below the lower margin of the image. Lung volumes are low. Patchy ill-defined opacities in the lower lungs bilaterally (left greater than right), slightly improved compared to the prior examination, favored to reflect resolving multilobar bronchopneumonia. Probable trace left pleural effusion. No evidence of pulmonary edema. Heart size is normal. Upper mediastinal contours are unremarkable. Orthopedic fixation hardware in the lower cervical spine incidentally noted.  IMPRESSION: 1. Support apparatus, as above. 2. Slightly improving aeration in the lower lobes of the lungs bilaterally, favored to reflect resolving multilobar bronchopneumonia. 3. Trace left pleural effusion.   Electronically Signed   By: Vinnie Langton M.D.   On: 10/17/2014 13:54    ASSESSMENT / PLAN:  PULMONARY ETT 12/19 >> 12/29, 12/31 >> 1/06 Trach DF 1/06 >> A:  Acute respiratory failure with hypoxemia > due to MRSA and Pseudomonas HCAP. Difficult airway 12/31 due to cord edema. Hx of COPD. P:   Pressure support wean to trach collar as tolerated Adjust oxygen to keep Sao2 > 92% Trach care F/u CXR intermittently PRN BD's  CARDIOVASCULAR Lt IJ CVL 12/28 >> A: Septic Shock 1/1 2nd to HCAP >> resolved. Demand ischemia 1/1. Lt brachial/radial/ulnar artery thrombectomy. P:   Monitor hemodyamics F/u with VVS Full dose lovenox >> eventually will need to transition to oral anticoagulant Continue ASA Keep CVL in while getting IV Abx  RENAL A: AKI 2nd to sepsis >> resolved. Hypokalemia. P:   Monitor BMET and UOP Replace electrolytes as needed   GASTROINTESTINAL A: Perforated gastric ulcer  and pneumoperitoneum s/p laparotomy. Hx of GERD. P:   Post-op care per CCS Pantoprazole for stress ulcer prophylaxis Continue tube feeds  HEMATOLOGIC A: Anemia of critical illness. P:   F/u CBC intermittently Lovenox for DVT prevention  INFECTIOUS A: Peritonitis after bowel perforation 12/20 > Candida glabrata peritonitis and fungemia. Septic shock 1/1 > due to MRSA and pseudomonas HCAP. P:  Day 19/42 of micafungin for peritonitis, fungemia per ID Day 11/14 of Abx for HCAP >> Day 9 of vancomycin, day 6 fortaz per ID  Blood 12/19 >> Candida glabrata Peritoneal fluid 12/20 >> Candida glabrata Sputum 12/31 >>  Pseudomonas aeruginosa, MRSA  ENDOCRINE A:  Hyperglycemia. Relative adrenal insufficiency >> stress steroid d/c'ed 1/08. P:   SSI with Lantus   NEUROLOGIC A: Post op pain Hx of multiple sclerosis, fibromyalgia, peripheral neuropathy, Guillian Barre syndrome. Acute encephalopathy after cardiac arrest 1/1 > improving. Startle myoclonus. Deconditioning. P:   RASS goal 0 Fentanyl, versed prn Can adjust klonopin as needed to control startle myoclonus Continue celexa Hold outpt neurontin, flexeril PT assessment  SUMMARY: Okay to transfer to [facility] when bed available.  Will place order to transfer to [place] vent SDU bed.  [name], MD [facility] Pulmonary/Critical Care 10/18/2014, 9:18 AM Pager:  [phone] After 3pm call: [phone]

## 2014-10-19 LAB — GLUCOSE, CAPILLARY: Glucose-Capillary: 89 mg/dL (ref 70–99)

## 2014-10-20 LAB — GLUCOSE, CAPILLARY
GLUCOSE-CAPILLARY: 111 mg/dL — AB (ref 70–99)
Glucose-Capillary: 132 mg/dL — ABNORMAL HIGH (ref 70–99)

## 2014-10-23 ENCOUNTER — Ambulatory Visit: Payer: BC Managed Care – PPO | Admitting: Family Medicine

## 2014-12-10 ENCOUNTER — Encounter: Payer: Self-pay | Admitting: Family Medicine

## 2014-12-10 ENCOUNTER — Ambulatory Visit (INDEPENDENT_AMBULATORY_CARE_PROVIDER_SITE_OTHER): Payer: BLUE CROSS/BLUE SHIELD | Admitting: Family Medicine

## 2014-12-10 VITALS — BP 98/60 | Ht <= 58 in

## 2014-12-10 DIAGNOSIS — I749 Embolism and thrombosis of unspecified artery: Secondary | ICD-10-CM

## 2014-12-10 DIAGNOSIS — R7309 Other abnormal glucose: Secondary | ICD-10-CM | POA: Diagnosis not present

## 2014-12-10 DIAGNOSIS — G4730 Sleep apnea, unspecified: Secondary | ICD-10-CM | POA: Diagnosis not present

## 2014-12-10 DIAGNOSIS — J9600 Acute respiratory failure, unspecified whether with hypoxia or hypercapnia: Secondary | ICD-10-CM

## 2014-12-10 DIAGNOSIS — I7090 Unspecified atherosclerosis: Secondary | ICD-10-CM

## 2014-12-10 DIAGNOSIS — D510 Vitamin B12 deficiency anemia due to intrinsic factor deficiency: Secondary | ICD-10-CM

## 2014-12-10 DIAGNOSIS — R531 Weakness: Secondary | ICD-10-CM

## 2014-12-10 DIAGNOSIS — Z7901 Long term (current) use of anticoagulants: Secondary | ICD-10-CM | POA: Diagnosis not present

## 2014-12-10 DIAGNOSIS — K251 Acute gastric ulcer with perforation: Secondary | ICD-10-CM

## 2014-12-10 DIAGNOSIS — Z79899 Other long term (current) drug therapy: Secondary | ICD-10-CM | POA: Diagnosis not present

## 2014-12-10 DIAGNOSIS — R7303 Prediabetes: Secondary | ICD-10-CM

## 2014-12-10 LAB — POCT INR: INR: 5.5

## 2014-12-10 MED ORDER — CITALOPRAM HYDROBROMIDE 20 MG PO TABS: 20.0000 mg | ORAL_TABLET | Freq: Every day | ORAL | Status: DC

## 2014-12-10 MED ORDER — OXYCODONE-ACETAMINOPHEN 5-325 MG PO TABS: 1.0000 | ORAL_TABLET | Freq: Three times a day (TID) | ORAL | Status: DC | PRN

## 2014-12-10 MED ORDER — GABAPENTIN 300 MG PO CAPS: 300.0000 mg | ORAL_CAPSULE | Freq: Two times a day (BID) | ORAL | Status: DC

## 2014-12-10 MED ORDER — CLONAZEPAM 0.5 MG PO TABS: ORAL_TABLET | ORAL | Status: DC

## 2014-12-10 MED ORDER — FOLIC ACID 1 MG PO TABS: 1.0000 mg | ORAL_TABLET | Freq: Every day | ORAL | Status: DC

## 2014-12-10 MED ORDER — PANTOPRAZOLE SODIUM 40 MG PO TBEC: 40.0000 mg | DELAYED_RELEASE_TABLET | Freq: Every day | ORAL | Status: DC

## 2014-12-10 MED ORDER — POTASSIUM CHLORIDE ER 10 MEQ PO TBCR: 20.0000 meq | EXTENDED_RELEASE_TABLET | Freq: Every day | ORAL | Status: DC

## 2014-12-10 MED ORDER — SENNOSIDES 8.6 MG PO TABS: 1.0000 | ORAL_TABLET | Freq: Two times a day (BID) | ORAL | Status: DC

## 2014-12-10 MED ORDER — APIXABAN 5 MG PO TABS: 5.0000 mg | ORAL_TABLET | Freq: Two times a day (BID) | ORAL | Status: DC

## 2014-12-10 NOTE — Progress Notes (Signed)
Subjective:    Patient ID: [name], female    DOB: [birth year], 49 y.o.   MRN: [id number]  HPI  Patient was recently in the hospital for an extended stay plus also is staying [facility] with a follow-up stay in the [facility] she is now at home. She is essentially homebound. She has to have help from her family with bathing, feeding, changing clothes, she spends most of her time in a wheelchair. Or in a bed or a recliner  She was admitted into the hospital because of an intestinal rupture that at first was thought to be a small bowel rupture but then was found out to be a gastric small bowel ulcer with the perforation. 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  She spent several weeks in [facility] and finally was able to be taken off of the ventilator. She spent several weeks at [facility] until she was able to come home  They come in today with multiple different medications that she was put on. She is been 1 week out of the rest home on Coumadin. Has not been monitored since leaving. We have no idea what her INR was in the rest home.  She has a history of chronic pain related to diabetic neuropathy as well as neuropathic pain in her legs and lumbar spine she is been on pain medications of oxycodone for years. She was sent home from the [facility] on 5 mg 3 times daily.  She is trying to eat healthy she relates she is taken her medicines they no longer have her on potassium despite the fact she's had numerous times of low potassium. The patient is also under the care of neurology at [facility]. 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 She has an appointment with them in early June.  This  patient was seen today for chronic pain as well as review of multiple medications  The medication list was reviewed and updated.   -Compliance with pain medication: yes  The patient was advised the importance of maintaining medication and not using illegal substances with these.  Refills needed: yes  The patient was educated that we can provide 3 monthly scripts for their medication, it is their responsibility to follow the instructions.  Side effects or complications from medications: none  Patient is aware that pain medications are meant to minimize the severity of the pain to allow their pain levels to improve to allow for better function. They are aware of that pain medications cannot totally remove their pain.  Due for UDT ( at least once per year) : Next visit   Patient is here to follow up since being released from the hospital. Patient wants to discuss going back on her oxycodone 10/325 because the hospital changed her medications and put her on oxycodone 5/325.   Patient needs a device to help keep her left arm straightened. Patient needs a glucometer and testing strips also.        Review of Systems Currently now she does state that she does have some coughing she denies shortness of breath when she is sitting still she denies chest pressure tightness pain no nausea vomiting diarrhea no abdominal pain denies rectal bleeding or hematuria relates burning  pain discomfort in her knees and in her lower legs along with burning discomfort of neuropathy in her feet.    Objective:   Physical Exam She is able to talk although her's speech is quiet neck no masses her lungs are clear there is no crackles heart is regular pulses normal abdomen is soft extremities no edema there is contractures in her extremities as well as weakness. She has weakness in her hands as well.       Assessment & Plan:  1. Artery occlusion It seems that this artery occlusion occurred during the mist of a  very difficult hospitalization for intestinal rupture sepsis respiratory failure. This could've been all the trigger along with IVs causing it. Nonetheless she is on Coumadin currently her INR is 5.5 which is excessively high. Stop Coumadin. Check INR daily until the INR is down toward 2. Strongly consider switching to Eliquis. Referral to hematology for opinion regarding how long to stay on anticoagulation plus also patient will need consideration for hypercoagulability workup after treatment. - Ambulatory referral to Hematology  2. Acute respiratory failure, unspecified whether with hypoxia or hypercapnia Patient does need a follow-up chest x-ray. We will also be looking at her CBC as well. Both were abnormal while in the hospital. I worry about the possibility of hypo-apnea versus sleep apnea. Referral to pulmonology. They can do further evaluation for sleep apnea. - DG Chest 2 View - CBC with Differential/Platelet  3. Prediabetes Check A1c await the results minimize starches - Hemoglobin A1c  4. Perforated gastric ulcer, acute Start PPI. Stop aspirin 325. Will continue blood thinner after INR is down  5. Weakness Patient has neurologic weakness following up with neurology in early June has physical therapy occupational therapy working with her she is disabled - CBC with Differential/Platelet  6. Pernicious anemia She has history of pernicious anemia check B12 resume B12 treatment - Vitamin B12 - Folate  7. Long-term (current) use of anticoagulants INR as discussed above. - POCT INR  8. Sleep apnea Patient needs evaluation for sleep apnea I would recommend pulmonology in [facility] - Ambulatory referral to Pulmonology  9. High risk medication use Patient does mean metabolic 7 profile checked she has a history of hypokalemia as well, potassium restarted today. - Basic metabolic panel   Blood pressure medicine was stopped we'll recheck blood pressure through home health and have  the patient follow-up with [place] in 6 weeks  Chronic leg pain with neuropathy gabapentin twice daily  Insomnia clonazepam 0.5 mg half tablet daily at bedtime when necessary  Chronic arm pain and leg pain has been on oxycodone before is currently taking 5 mg 3 times a day. 2-90 tablet prescriptions were given, take 1 3 times a day.  This patient is disabled. She will never be able to work.  Very complex patient 40 minutes spent with patient  I spoke with hematology. They agreed that the patient should start on Eliquis 5 mg twice a day once her INR is down to 2 range. The patient was instructed to stop Coumadin. Home health will be checking the INR is in calling that to [place] daily.

## 2014-12-11 ENCOUNTER — Telehealth: Payer: Self-pay | Admitting: Family Medicine

## 2014-12-11 ENCOUNTER — Telehealth: Payer: Self-pay | Admitting: *Deleted

## 2014-12-11 NOTE — Progress Notes (Signed)
Juliann Pulse from [facility] called and said that [name]'s INR was 3.6. Does she need to do anything different or CST? See Dr. Bary Leriche note below.

## 2014-12-11 NOTE — Telephone Encounter (Signed)
[facility]) therapist called needing verbals orders to help/treat patient with therapy. When call please leave name if she doesn't answer and she will call back. [phone]

## 2014-12-11 NOTE — Telephone Encounter (Signed)
[facility] 3/4 (waiting on INR result)

## 2014-12-11 NOTE — Telephone Encounter (Signed)
[name] from [facility] called and said that [name]'s INR was 3.6.

## 2014-12-11 NOTE — Telephone Encounter (Signed)
Dr. Nicki Reaper called to check on the INR. Told him it was 3.6. Home health nurse notified and verbalized understanding to recheck INR every day and to notify us. Nurse's name is Juliann Pulse, cell # [phone]. Dr. Nicki Reaper also gave a detailed message about when to do when the INR gets to 2.

## 2014-12-11 NOTE — Progress Notes (Signed)
[facility] 3/4 (waiting on INR result)

## 2014-12-25 ENCOUNTER — Other Ambulatory Visit: Payer: Self-pay | Admitting: Family Medicine

## 2014-12-25 DIAGNOSIS — G35 Multiple sclerosis: Secondary | ICD-10-CM | POA: Diagnosis not present

## 2014-12-25 DIAGNOSIS — I10 Essential (primary) hypertension: Secondary | ICD-10-CM | POA: Diagnosis not present

## 2014-12-25 DIAGNOSIS — Z48815 Encounter for surgical aftercare following surgery on the digestive system: Secondary | ICD-10-CM | POA: Diagnosis not present

## 2014-12-25 DIAGNOSIS — Z48812 Encounter for surgical aftercare following surgery on the circulatory system: Secondary | ICD-10-CM | POA: Diagnosis not present

## 2014-12-28 NOTE — Telephone Encounter (Signed)
May give this +4 refills 

## 2014-12-30 ENCOUNTER — Telehealth: Payer: Self-pay | Admitting: Family Medicine

## 2014-12-30 NOTE — Telephone Encounter (Signed)
The difficult part is that there is no inexpensive anticoagulants. The only other option is Coumadin but that requires her to commit herself to taking it as prescribed as well as coming on a regular basis to check her INR. Discussed with home health who should discuss with the patient find out what she would like to do

## 2014-12-30 NOTE — Telephone Encounter (Signed)
Patient is on Eliquis 5mg  too expensive can you call in something else to [facility] [place]

## 2014-12-31 NOTE — Telephone Encounter (Signed)
May use generic Coumadin 2.5 mg tablet, start off one per day, check INR next Thursday or Friday, #30, 2 refills, patient to follow-up in approximately 3-4 weeks if she doesn't already have office visit. Patient also needs INR nurse visits weekly until INR is between 2 and 3. Nurses, please create Coumadin INR sheet for her. Also please be certain this sheet reflects the range between 2 and 3. Her length of Coumadin use will be through June of this year. We plan to have her consult with hematology in May or June regarding the need for long-term anticoagulation

## 2014-12-31 NOTE — Telephone Encounter (Signed)
[name] states that she wants to try the Coumadin. Patient is ok with INR checks.

## 2014-12-31 NOTE — Telephone Encounter (Signed)
Of note patient should continue protonix, also remove Eliquis from her medicine list. Please verify if patient taking aspirin currently? If so she needs to reduce this to 81 mg. If she is not taking aspirin then please give me feedback thank you

## 2015-01-01 MED ORDER — APIXABAN 5 MG PO TABS: 5.0000 mg | ORAL_TABLET | Freq: Two times a day (BID) | ORAL | Status: DC

## 2015-01-01 NOTE — Telephone Encounter (Signed)
She can stick with Eliquis for 3 weeks BUT when she runs out she will need to either 1-switch to coumadin as related below with INR followed closely here (typically weekly till regulated then monthly) or 2- find the money to stick with Eliquis. Each way has its associated costs as well as risk and benefits. ( coumadin is cheap but frequent OV for INR cost co-pays and raises issues of transportation. Eliquis is expensive but no INR ,less frequent OV ) the patient needs to decide and let us know over the next week!

## 2015-01-01 NOTE — Telephone Encounter (Signed)
Pt is not taking aspirin. Pt would like to stay on Eliquis.

## 2015-01-01 NOTE — Telephone Encounter (Signed)
Before I even called the patient, I called Mandy. She said that Khala has [place] and they will not cover a nurse to come out to do weekly INR checks. Leafy Ro also said that she has enough Eliquis to last [place] about 2 or 3 weeks.

## 2015-01-01 NOTE — Addendum Note (Signed)
Addended byCharolotte Capuchin D on: 01/01/2015 11:04 AM   Modules accepted: Orders, Medications

## 2015-01-01 NOTE — Telephone Encounter (Signed)
3/25 Mandy from [facility] will call us back after speaking to her nurse manager about doing INR's on [place].

## 2015-01-13 ENCOUNTER — Ambulatory Visit (HOSPITAL_COMMUNITY): Payer: Self-pay | Admitting: Hematology & Oncology

## 2015-01-13 ENCOUNTER — Encounter (HOSPITAL_COMMUNITY): Payer: Self-pay | Admitting: Hematology & Oncology

## 2015-01-13 ENCOUNTER — Encounter (HOSPITAL_COMMUNITY): Payer: BLUE CROSS/BLUE SHIELD | Attending: Hematology & Oncology | Admitting: Hematology & Oncology

## 2015-01-13 VITALS — BP 111/74 | HR 97 | Temp 97.5°F | Resp 20 | Wt 140.0 lb

## 2015-01-13 DIAGNOSIS — M549 Dorsalgia, unspecified: Secondary | ICD-10-CM | POA: Insufficient documentation

## 2015-01-13 DIAGNOSIS — G35 Multiple sclerosis: Secondary | ICD-10-CM

## 2015-01-13 DIAGNOSIS — M792 Neuralgia and neuritis, unspecified: Secondary | ICD-10-CM | POA: Diagnosis not present

## 2015-01-13 DIAGNOSIS — R29898 Other symptoms and signs involving the musculoskeletal system: Secondary | ICD-10-CM | POA: Diagnosis not present

## 2015-01-13 DIAGNOSIS — G709 Myoneural disorder, unspecified: Secondary | ICD-10-CM | POA: Insufficient documentation

## 2015-01-13 DIAGNOSIS — G8929 Other chronic pain: Secondary | ICD-10-CM | POA: Insufficient documentation

## 2015-01-13 DIAGNOSIS — I749 Embolism and thrombosis of unspecified artery: Secondary | ICD-10-CM | POA: Diagnosis not present

## 2015-01-13 DIAGNOSIS — I748 Embolism and thrombosis of other arteries: Secondary | ICD-10-CM

## 2015-01-13 NOTE — Progress Notes (Signed)
[place] CONSULT NOTE  Patient Care Team: Kathyrn Drown, MD as PCP - General (Family Medicine) Flossie Buffy. Redmond Pulling, MD as Referring Physician (Otolaryngology)  CHIEF COMPLAINTS/PURPOSE OF CONSULTATION:   LUE Arterial Thrombus in the setting of acute illness History of pernicious anemia Multiple Sclerosis History of Guillian Barre Syndrome  ? 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 female is here because of an arterial thrombosis. He has a very complicated medical history. She was admitted to the [facility] system on December 19 of 2015 with a CT of the abdomen showing pneumoperitoneum. She had a cold left hand concerning for an arterial clot. On 09/27/2014 she underwent a left brachial and ulnar artery embolectomy and also an exploratory laparotomy with an omental patch for a perforated prepyloric gastric ulcer. She developed a reocclusion of the left upper extremity and underwent a left brachial to left radial artery bypass with left leg greater saphenous vein. During her hospital admission she was coded for 4 minutes. She remarkably improved and was discharged from the hospital.  Prior to her hospitalization the patient noted a 1 day history of her hand feeling "weird" she has a history of neuropathy in that hand. At the time of her hospitalization in December she was a current smoker.   No history exists.     MEDICAL HISTORY:  Past Medical History  Diagnosis Date  . Anxiety   . Depression   . COPD (chronic obstructive pulmonary disease)   . MS (multiple sclerosis)   . Fibromyalgia   . Impaired fasting glucose   . 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 Eating disorder   . Chronic low back pain   . PONV (postoperative nausea and vomiting)   . Chronic bronchitis     "yearly" (02/11/2013)  . Borderline diabetes   . GERD (gastroesophageal reflux disease)   . Migraine     "I use to" (02/11/2013)  . Arthritis     "hands & feet" (02/11/2013)  . Peripheral neuropathy     [name] 02/11/2013  . B12 deficiency anemia     [name] 02/11/2013  . Chronic pain syndrome     [name] 02/11/2013  . UTI (lower urinary tract infection) 02/11/2013    [name] 02/11/2013  . Chronic edema     BLE/notes 02/11/2013  . Ataxia   . Peripheral neuropathy   . Muscle weakness of lower extremity     bilateral  . Hypertension     SURGICAL HISTORY: Past Surgical History  Procedure Laterality Date  . Abdominal hysterectomy  ~ 1987; ~ 2004    "[place]; ferguson" (02/11/2013)  . Cesarean section  6712; 1984; 1986  . Anterior cervical decomp/discectomy fusion  2009; 2011  . Tonsillectomy  1990's?  . Cataract extraction w/phaco  06/20/2011    Procedure: CATARACT EXTRACTION PHACO AND INTRAOCULAR LENS PLACEMENT (IOC);  Surgeon: [name] T. Gershon Crane;  Location: [facility];  Service: Ophthalmology;  Laterality: Left;  CDE 1.81  . Cataract extraction w/phaco  07/04/2011    Procedure: CATARACT EXTRACTION PHACO AND INTRAOCULAR LENS PLACEMENT (IOC);  Surgeon: [name] T. Gershon Crane;  Location: [facility];  Service: Ophthalmology;  Laterality: Right;  CDE: 1.76  .  Yag laser application Right 2/95/2841    Procedure: YAG LASER APPLICATION;  Surgeon: [name] T. Gershon Crane, MD;  Location: [facility];  Service: Ophthalmology;  Laterality: Right;  . Yag laser application Left 12/30/4008    Procedure: YAG LASER APPLICATION;  Surgeon: [name] T. Gershon Crane, MD;  Location: [facility];  Service: Ophthalmology;  Laterality: Left;  . Appendectomy    . Laparotomy N/A 09/27/2014    Procedure: Exploratory Laparotomy, Biopsy of Perforated Gastric Ulcer, Closure with omental patch;  Surgeon: [name], MD;   Location: [place];  Service: General;  Laterality: N/A;  . Embolectomy Left 09/27/2014    Procedure: Left Brachial, Radial,Ulnar Embolectomy with patch angioplasty left brachial, radial and ulnar artery;  Surgeon: [name], MD;  Location: [facility];  Service: Vascular;  Laterality: Left;  . Embolectomy Left 09/27/2014    Procedure: Left Radial, Brachial, and Ulnar Thrombectomy; Left Brachial to Radial Bypass Graft using Greater Saphenous vein graft from Left Thigh; Left Saphenous Vein Harvest; Intraoperative Arteriogram; Intra-arterial administration of TPA;  Surgeon: [name], MD;  Location: [place];  Service: Vascular;  Laterality: Left;  . Tracheostomy N/A december 2015    SOCIAL HISTORY: History   Social History  . Marital Status: Divorced    Spouse Name: N/A  . Number of Children: N/A  . Years of Education: N/A   Occupational History  . Not on file.   Social History Main Topics  . Smoking status: Current Every Day Smoker -- 1.00 packs/day for 33 years    Types: Cigarettes  . Smokeless tobacco: Former Systems developer    Quit date: 09/08/2014  . Alcohol Use: No  . Drug Use: No  . Sexual Activity: Not Currently    Birth Control/ Protection: Surgical   Other Topics Concern  . Not on file   Social History Narrative   he is divorced. She was a 2 pack per day smoker her entire life until she quit in December of last year. She has 2 sons, 3 grandchildren. She does not drink alcohol. 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   FAMILY HISTORY: Family History  Problem Relation Age of Onset  . Anesthesia problems Neg Hx   . Hypotension Neg Hx   . Malignant hyperthermia Neg Hx   . Pseudochol deficiency Neg Hx   . Hyperlipidemia Mother   . Heart disease Mother   . Cancer Mother     lung  . Diabetes Father   . Heart disease Father   . Cancer Maternal Grandmother     breast   indicated that her mother is deceased. She indicated that her father is deceased.  Other died  at 80 from lung cancer, she was a heavy smoker. Father died at 80 from an MI. She has no family history of blood clots. He has 2 brothers and 1 sister.   ALLERGIES:  is allergic to ambien; ceftin; hctz; lodine; promethazine; roxicodone; codeine; latex; and penicillins.  MEDICATIONS:  Current Outpatient Prescriptions  Medication Sig Dispense Refill  . apixaban (ELIQUIS) 5 MG TABS tablet Take 1 tablet (5 mg total) by mouth 2 (two) times daily. 60 tablet 5  . folic acid (FOLVITE) 1 MG tablet Take 1 tablet (1 mg total) by mouth daily. 30 tablet 5  . gabapentin (NEURONTIN) 300 MG capsule Take 1 capsule (300 mg total) by mouth 2 (two) times daily. 60 capsule 5  . pantoprazole (PROTONIX) 40 MG tablet Take 1 tablet (40 mg total) by mouth  daily. 30 tablet 6  . potassium chloride (K-DUR) 10 MEQ tablet Take 2 tablets (20 mEq total) by mouth daily. 60 tablet 5  . senna (SENOKOT) 8.6 MG tablet Take 1 tablet (8.6 mg total) by mouth 2 (two) times daily. 60 tablet 5  . Thiamine HCl (VITAMIN B-1 PO) Take by mouth daily.    . valACYclovir (VALTREX) 1000 MG tablet Take 1,000 mg by mouth 3 (three) times daily. For 7 days    . vitamin B-12 (CYANOCOBALAMIN) 1000 MCG tablet Take 1,000 mcg by mouth daily.    . ONE TOUCH ULTRA TEST test strip     . ONETOUCH DELICA LANCETS 66Y MISC     . oxyCODONE-acetaminophen (PERCOCET/ROXICET) 5-325 MG per tablet Take 1 tablet by mouth 3 (three) times daily as needed for severe pain. 90 tablet 0  . traZODone (DESYREL) 50 MG tablet Take 0.5-1 tablets (25-50 mg total) by mouth at bedtime as needed for sleep. 30 tablet 3   No current facility-administered medications for this visit.    Review of Systems  Constitutional: Positive for malaise/fatigue.  HENT: Negative.   Eyes: Negative.   Respiratory: Positive for shortness of breath.   Cardiovascular: Negative.   Gastrointestinal: Positive for constipation.  Genitourinary: Negative.   Musculoskeletal: Positive for myalgias,  back pain and joint pain.  Skin: Negative.   Neurological: Positive for sensory change and weakness.  Endo/Heme/Allergies: Negative.   Psychiatric/Behavioral: The patient has insomnia.     PHYSICAL EXAMINATION: ECOG PERFORMANCE STATUS: 2 - Symptomatic, <50% confined to bed  Filed Vitals:   01/13/15 1518  BP: 111/74  Pulse: 97  Temp: 97.5 F (36.4 C)  Resp: 20   Filed Weights   01/13/15 1518  Weight: 140 lb (63.504 kg)     Physical Exam  Constitutional: She is oriented to person, place, and time and well-developed, well-nourished, and in no distress.  In a wheelchair   HENT:  Head: Normocephalic and atraumatic.  Nose: Nose normal.  Mouth/Throat: Oropharynx is clear and moist. No oropharyngeal exudate.  Eyes: Conjunctivae and EOM are normal. Pupils are equal, round, and reactive to light. Right eye exhibits no discharge. Left eye exhibits no discharge. No scleral icterus.  Neck: Normal range of motion. Neck supple. No tracheal deviation present. No thyromegaly present.  Cardiovascular: Normal rate, regular rhythm and normal heart sounds.  Exam reveals no gallop and no friction rub.   No murmur heard. Pulmonary/Chest: Effort normal and breath sounds normal. She has no wheezes. She has no rales.  Abdominal: Soft. Bowel sounds are normal. She exhibits no distension and no mass. There is no tenderness. There is no rebound and no guarding.  Musculoskeletal: Normal range of motion. She exhibits edema.  Lymphadenopathy:    She has no cervical adenopathy.  Neurological: She is alert and oriented to person, place, and time. No cranial nerve deficit.  Gait not assessed  Skin: Skin is warm and dry. No rash noted.  Psychiatric: Mood, memory, affect and judgment normal.  Nursing note and vitals reviewed.    LABORATORY DATA:  I have reviewed the data as listed Lab Results  Component Value Date   WBC 12.2* 10/18/2014   HGB 8.5* 10/18/2014   HCT 28.5* 10/18/2014   MCV 102.9*  10/18/2014   PLT 532* 10/18/2014     Chemistry      Component Value Date/Time   NA 142 10/18/2014 0330   K 2.8* 10/18/2014 0330   CL 100 10/18/2014 0330   CO2 32 10/18/2014  0330   BUN 8 10/18/2014 0330   CREATININE 0.55 10/18/2014 0330   CREATININE 0.80 12/08/2013 1313      Component Value Date/Time   CALCIUM 8.6 10/18/2014 0330   ALKPHOS 128* 10/11/2014 0400   AST 294* 10/11/2014 0400   ALT 226* 10/11/2014 0400   BILITOT 0.7 10/11/2014 0400       RADIOGRAPHIC STUDIES: I have personally reviewed the radiological images as listed and agreed with the findings in the report.  CLINICAL DATA: 49 year old with left upper extremity ischemia. Recurrent ischemia despite surgical thrombectomy.  EXAM: 09/27/2014 CT ANGIOGRAPHY CHEST WITH CONTRAST IMPRESSION: Thrombus in the left subclavian artery, originating approximately 2 cm from the origin. This thrombus is nonocclusive but could be a source for embolic disease in the left upper extremity. No clear evidence for a dissection.  The papillary muscles appear to be prominent in the left ventricle. These findings are nonspecific on this examination and recommend correlation with echocardiogram.  There is marked volume loss and consolidation in the lower lobes bilaterally. Trace bilateral pleural effusions.  Abdominal ascites.   Electronically Signed  By: Markus Daft M.D.  On: 09/28/2014 09:54   CLINICAL DATA: RIGHT upper quadrant pain that began 2 days ago. Nausea and vomiting. Shallow breathing.  EXAM: CT ABDOMEN AND PELVIS WITHOUT CONTRAST IMPRESSION: Suspected ischemic proximal small bowel, with pneumoperitoneum and ascites. Findings discussed with ordering provider by myself at 8:45 p.m. 09/26/14.   Electronically Signed  By: Rolla Flatten M.D.  On: 09/26/2014 20:49   Transesophageal Echocardiography 10/05/2014 LV EF: 65% -   70%  ------------------------------------------------------------------- Indications:   Bacteremia 790.7.  ------------------------------------------------------------------- Study Conclusions  - Left ventricle: The cavity size was normal. Wall thickness was normal. Systolic function was vigorous. The estimated ejection fraction was in the range of 65% to 70%. Wall motion was normal; there were no regional wall motion abnormalities. - Aortic valve: No evidence of vegetation. - Mitral valve: No evidence of vegetation. There was mild regurgitation. - Left atrium: No evidence of thrombus in the appendage. - Right atrium: No evidence of thrombus in the atrial cavity or appendage. - Atrial septum: No defect or patent foramen ovale was identified. Echo contrast study showed no right-to-left atrial level shunt, following an increase in RA pressure induced by provocative maneuvers. - Tricuspid valve: No evidence of vegetation. There was trivial regurgitation. - Pulmonic valve: No evidence of vegetation. There was trivial regurgitation.  Impressions:  - No cardiac source of emboli was indentified.   ASSESSMENT & PLAN:  Arterial thrombosis L brachial radial and ulnar arteries Thrombosis of L Subclavian Artery Thrombectomy of L brachial, radial, and ulnar arteries Patch angioplasty of the Left Brachial and ulnar artery Patch angioplasty of the Left Radial artery Redo of left brachial, ulnar and radial exposure Thrombectomy Left brachial, ulnar and radial artery Angiogram LUE Left brachial to left radial artery bypass with left leg greater saphenous vein Intra-arterial injection of TPA    49 year old female with multiple sclerosis and limited functional status secondary to her disease. She presented with abdominal pain and was noted at presentation to have a cold left upper extremity. Spiriva an embolectomy she reoccluded and ultimately underwent a left  brachial to left radial artery bypass graft with left leg greater saphenous vein. Ultrasound also showed thrombosis of the left subclavian artery. TEE was performed that was negative for source of emboli.  She is currently on Eliquis  she currently needs to continue with anticoagulation. Her arterial thrombosis was present at the time  of presentation although she clearly had evidence of pneumoperitoneum. He clearly had evidence of thrombo-emboli given the ischemia of her limb. Atheroemboli are less likely to produce symptoms of acute limb ischemia. The majority of arterial emboli the travel to the extremities originated in the heart and her TEE was normal. At evidence of a subclavian arterial embolus as detailed.  No problem-specific assessment & plan notes found for this encounter.  Orders Placed This Encounter  Procedures  . Factor 5 leiden    Standing Status: Future     Number of Occurrences: 1     Standing Expiration Date: 01/13/2016  . Beta-2-glycoprotein i abs, IgG/M/A    Standing Status: Future     Number of Occurrences: 1     Standing Expiration Date: 01/13/2016  . Cardiolipin antibodies, IgG, IgM, IgA    Standing Status: Future     Number of Occurrences: 1     Standing Expiration Date: 01/13/2016  . D-dimer, quantitative    Standing Status: Future     Number of Occurrences: 1     Standing Expiration Date: 01/13/2016  . Prothrombin gene mutation    Standing Status: Future     Number of Occurrences: 1     Standing Expiration Date: 01/13/2016  . Lupus anticoagulant panel    Standing Status: Future     Number of Occurrences: 1     Standing Expiration Date: 01/13/2016  . Antithrombin III    Standing Status: Future     Number of Occurrences: 1     Standing Expiration Date: 01/13/2016  . Protein C activity    Standing Status: Future     Number of Occurrences: 1     Standing Expiration Date: 01/13/2016  . Protein C, total    Standing Status: Future     Number of Occurrences: 1      Standing Expiration Date: 01/13/2016  . Protein S activity    Standing Status: Future     Number of Occurrences: 1     Standing Expiration Date: 01/13/2016  . Protein S, total    Standing Status: Future     Number of Occurrences: 1     Standing Expiration Date: 01/13/2016  . D-dimer, quantitative    Standing Status: Future     Number of Occurrences: 1     Standing Expiration Date: 01/14/2016  . Lupus anticoagulant panel    Standing Status: Future     Number of Occurrences: 1     Standing Expiration Date: 01/14/2016     FROM [facility], Dr. Odis Hollingshead Causes The 2 main causes of arterial thromboembolism are certainly arteriosclerosis and atrial fibriallation. Only uncommonly do arterial clots occur in persons less than 28 or 50 years of age who do not have arteriosclerosis or atrial fibrillation. Under these circumstances, a number of uncommon conditions, including thrombophilias, should be considered and investigated  Thrombophilias Thrombophilias do not play much of a role in arterial thrombosis. Heterozygous factor V Leiden and heterozygous prothrombin 20210 mutation by themselves do not (factor V Leiden) or only minimally (prothrombin 20210 mutation) increase the risk for arterial thromboembolism (ref 1). Whether the risk is increased in the individual with homozygous factor V Leiden, homozygous prothrombin 20210 mutation, and double heterozygous individual (who has both factor V Leiden and the prothrombin 20210 mutation) is not known. Protein C deficiency and protein S deficiency appear to put a person at moderately increased risk for arterial clots (ref 2). Whether antithrombin deficiency is a risk factor is  not clear; recent data suggest, that it may not (ref 2). MTHFR polymorphisms are not a risk factor for arterial thrombosis in the [place] where food is supplemented with folic acid (ref 3).  However, I acknowledge the fact that it is not known whether individuals with arterial clots who are  found to have one of the strong thrombophilias are more effectively treated with anti-platelet therapy or anticoagulants. Indivdiualized decisions need to be made.  We will therefore proceed with a thrombophilia evaluation. The only risk factor I can identify is her prior heavy smoking history. He is not diabetic, I do not know her cholesterol profile, specifically LDL and HDL. Based upon the results of her thrombophilia evaluation we can make recommendations regarding duration of anticoagulation. Keeping in mind the recommendations listed above.   All questions were answered. The patient knows to call the clinic with any problems, questions or concerns. This note was electronically signed.    Molli Hazard, MD  02/11/2015 8:32 AM

## 2015-01-13 NOTE — Patient Instructions (Signed)
[place] at [facility]  Discharge Instructions:  You saw Dr Whitney Muse today. Blood work today. Blood work in 4 weeks.  Return in 2 months to see the doctor. Please call the clinic if you have any questions or concerns.  _______________________________________________________________  Thank you for choosing [place] at [facility] to provide your oncology and hematology care.  To afford each patient quality time with our providers, please arrive at least 15 minutes before your scheduled appointment.  You need to re-schedule your appointment if you arrive 10 or more minutes late.  We strive to give you quality time with our providers, and arriving late affects you and other patients whose appointments are after yours.  Also, if you no show three or more times for appointments you may be dismissed from the clinic.  Again, thank you for choosing [place] at [place] hope is that these requests will allow you access to exceptional care and in a timely manner. _______________________________________________________________  If you have questions after your visit, please contact our office at [phone] between the hours of 8:30 a.m. and 5:00 p.m. Voicemails left after 4:30 p.m. will not be returned until the following business day. _______________________________________________________________  For prescription refill requests, have your pharmacy contact our office. _______________________________________________________________  Recommendations made by the consultant and any test results will be sent to your referring physician. _______________________________________________________________

## 2015-01-13 NOTE — Progress Notes (Signed)
[name]'s reason for visit today is for labs as scheduled per MD orders.  Venipuncture performed with a 23 gauge butterfly needle to right hand.  [name] tolerated procedure well and without incident; questions were answered and patient was discharged.

## 2015-01-15 ENCOUNTER — Telehealth: Payer: Self-pay | Admitting: *Deleted

## 2015-01-15 DIAGNOSIS — R531 Weakness: Secondary | ICD-10-CM

## 2015-01-15 DIAGNOSIS — G709 Myoneural disorder, unspecified: Secondary | ICD-10-CM

## 2015-01-15 DIAGNOSIS — R270 Ataxia, unspecified: Secondary | ICD-10-CM

## 2015-01-15 NOTE — Telephone Encounter (Signed)
It is finding go ahead and make referral for physical therapy as an outpatient. Diagnosis is weakness ataxia neuromuscular disorder

## 2015-01-15 NOTE — Telephone Encounter (Signed)
[facility]. Husband is calling because home health nurse told him we would be setting up PT for her. [facility] and they said they did PT from 12/08/14 -01/08/15 and they discharged her and she needs outpt therapy. Does pt need pt at [facility]? Pt last seen 12/10/14

## 2015-01-18 ENCOUNTER — Institutional Professional Consult (permissible substitution): Payer: Self-pay | Admitting: Internal Medicine

## 2015-01-18 LAB — CARDIOLIPIN ANTIBODIES, IGG, IGM, IGA
Anticardiolipin IgG: <9 GPL U/mL (ref 0–14)
Anticardiolipin IgM: <9 MPL U/mL (ref 0–12)

## 2015-01-18 LAB — FACTOR 5 LEIDEN

## 2015-01-18 LAB — PROTHROMBIN GENE MUTATION

## 2015-01-18 LAB — LUPUS ANTICOAGULANT PANEL

## 2015-01-18 LAB — BETA-2-GLYCOPROTEIN I ABS, IGG/M/A
Beta-2 Glyco I IgG: <9 GPI IgG units (ref 0–20)
Beta-2-Glycoprotein I IgA: <9 GPI IgA units (ref 0–25)
Beta-2-Glycoprotein I IgM: <9 GPI IgM units (ref 0–32)

## 2015-01-18 NOTE — Telephone Encounter (Signed)
[facility] 4/11

## 2015-01-19 NOTE — Telephone Encounter (Signed)
Order for referral to [facility] pt put in. [facility] to let pt know.

## 2015-01-20 NOTE — Telephone Encounter (Signed)
Pt scheduled April 27th 1 pm. Pt notified.

## 2015-01-21 ENCOUNTER — Ambulatory Visit (INDEPENDENT_AMBULATORY_CARE_PROVIDER_SITE_OTHER): Payer: BLUE CROSS/BLUE SHIELD | Admitting: Family Medicine

## 2015-01-21 ENCOUNTER — Telehealth: Payer: Self-pay | Admitting: Family Medicine

## 2015-01-21 ENCOUNTER — Encounter: Payer: Self-pay | Admitting: Family Medicine

## 2015-01-21 VITALS — BP 102/62 | Ht <= 58 in

## 2015-01-21 DIAGNOSIS — G709 Myoneural disorder, unspecified: Secondary | ICD-10-CM

## 2015-01-21 DIAGNOSIS — R29898 Other symptoms and signs involving the musculoskeletal system: Secondary | ICD-10-CM

## 2015-01-21 DIAGNOSIS — G8929 Other chronic pain: Secondary | ICD-10-CM

## 2015-01-21 DIAGNOSIS — I749 Embolism and thrombosis of unspecified artery: Secondary | ICD-10-CM

## 2015-01-21 DIAGNOSIS — M792 Neuralgia and neuritis, unspecified: Secondary | ICD-10-CM

## 2015-01-21 DIAGNOSIS — M549 Dorsalgia, unspecified: Secondary | ICD-10-CM

## 2015-01-21 MED ORDER — OXYCODONE-ACETAMINOPHEN 5-325 MG PO TABS: 1.0000 | ORAL_TABLET | Freq: Three times a day (TID) | ORAL | Status: DC | PRN

## 2015-01-21 MED ORDER — TRAZODONE HCL 50 MG PO TABS: 25.0000 mg | ORAL_TABLET | Freq: Every evening | ORAL | Status: DC | PRN

## 2015-01-21 NOTE — Progress Notes (Signed)
   Subjective:    Patient ID: [name], female    DOB: [birth year], 49 y.o.   MRN: [id number]  HPI Patient arrives for a follow up on her blood pressure. This patient states she's taken her medicines as directed. She denies that she is having any problems. She requests refills on her pain medicine. She requests that the pain medication be increased. She denies any heartburn problems as long as she takes her medicine. She does state she's having some sleep issues wondered if she could have some medication to help her sleep aid it gets that the patient does state next during the day  Review of Systems See below    Objective:   Physical Exam Heart regular blood pressure 100/60 respiratory rate is good no crackles extremities trace edema contractures noted in the ankles mild atrophy occurring in the legs from nondoes not no fevers no cough no wheezing no difficulty breathing relates pain discomfort in the lower legs. Denies headaches shortness of breath. Patient unable to care for herself. use       Assessment & Plan:   chronic pain-patient was requesting a medication be increased because the amount of painful neuropathy she is having in her legs. She has had some pretty severe hospitalizations recently and I told her I did not want to increase the pain medicine because I was fearful it would increase her risk of accidental aspiration and potential pneumonia    patient on Eliquis tolerating well no sign of bleeding, this patient had brachial artery occlusion 4 months ago. I would recommend continuing this medication for at least 3-4 more months then would recommend consultation with hematology to see if further anticoagulation necessary    contractures of the legs-this patient would benefit from physical therapy she will start this at the end of this month. She will be seen neurology at the end of this month her neuromuscular condition has not changed  This patient currently has her own  health aide coming to help her I believe this is necessary for this patient in order for her to remain at home her husband helps out in the evening  Not on BP meds currently, no need to be on any  Stopped Celexa try trazodone at night see if that helps sleep very important for the patient to try to stay awake during the day  Follow up in 3 months  25 minutes spent with pt greater than half in discussion of issues,options and treatments

## 2015-01-21 NOTE — Telephone Encounter (Signed)
Please let the patient know. I believe she is slightly confused of what I was telling her. When she showed up today her blood pressure was on the low end. Her electronic medical records stated that she was still taking her blood pressure medicine. The patient stated that she was not. I asked them to go home and check. She is not taking labetalol. She does not need to take labetalol. Please inform the patient there is no need to be on labetalol.

## 2015-01-21 NOTE — Telephone Encounter (Signed)
Patient was told to call back if she did not have an labetalol at home and she does not.  Can we call in to [place]?

## 2015-01-21 NOTE — Telephone Encounter (Signed)
Discussed with pt that she does not need to take the labetalol. Pt verbalized understanding.

## 2015-02-02 ENCOUNTER — Ambulatory Visit (INDEPENDENT_AMBULATORY_CARE_PROVIDER_SITE_OTHER): Payer: BLUE CROSS/BLUE SHIELD | Admitting: Pulmonary Disease

## 2015-02-02 ENCOUNTER — Encounter: Payer: Self-pay | Admitting: Pulmonary Disease

## 2015-02-02 VITALS — BP 90/82 | HR 69

## 2015-02-02 DIAGNOSIS — I749 Embolism and thrombosis of unspecified artery: Secondary | ICD-10-CM

## 2015-02-02 DIAGNOSIS — J4520 Mild intermittent asthma, uncomplicated: Secondary | ICD-10-CM | POA: Diagnosis not present

## 2015-02-02 DIAGNOSIS — G4700 Insomnia, unspecified: Secondary | ICD-10-CM | POA: Diagnosis not present

## 2015-02-02 NOTE — Progress Notes (Signed)
Subjective:    Patient ID: [name], female    DOB: 01/06/1966, 49 y.o.   MRN: [id number]  HPI  44yof smoker, critical illness survivor, presents for evaluation of insomnia PMH of neurological illness that has been variously described as MS, depression, fibromyalgia, peripheral neuropathy, Guillian Barre syndrome. Admitted 09/26/14- 10/18/14 with pneumoperitoneum, cold L hand due to arterial clot , then to [facility] with tracheostomy, then to NH at [facility] until 2/28, now home, PT to start [facility] tomorrow  12/20 ex-lap [name]) with omental patch for perforated prepyloric gastric ulcer; L brachial and ulnar artery embolectomy ([place]) 12/20 Left brachial to left radial artery bypass with left leg greater saphenous vein for recurrent occlusion  She suffered a brief cardiac arrest on 1/1 01/06 Trach Course was complicated by MRSA and Pseudomonas PNA, Candida glabrata peritonitis and fungemia  Significant tests/ events  09/26/14 CT abd/pelvis >> ischemic proximal SB with pneumoperitoneum and ascites 12/20 CT angio chest >> thrombus Lt [place] artery 12/20 LE doppler >> negative 12/21 echo >> nml LVEF 12/26 CT abdomen >> pelvic fluid 12/28 TEE negative 12/31 CT head >> chronic infarcts of globus pallidi b/l 01/01 CT abd/pelvis >> no obstruction, perforation, or abscess 01/08 EEG >> background slowing, no epileptiform activity 02/02/15, spirometry -mild airway obstruction, ratio 71, FEV1 78%, FVC 95% , mild flattening of the expiratory loop  She presents today for evaluation of insomnia and pulmonary issues. She worked the third shift for 12 years in the past, had been disabled for 6 years. She has been going through this neurological illness that has not been well characterized. She was hospitalized for 4 months at [facility] ICU in 2014 followed by a month in December 2015. Ever since discharge to home in February 2016, she has been unable to sleep for more than 4 hours every night. She does not  have a fixed bedtime-can vary from 8 PM to midnight. Sleep latency is prolonged, she sleeps on her right side with 2 pillows, reports 4-5 nocturnal awakenings with prolonged sleep latency. She wakes up feeling tired with occasional dryness of mouth. No snoring has been noted. Epworth sleepiness score is 8 There is no history suggestive of cataplexy, sleep paralysis or parasomnias She smoked more than a pack per day for 30 years  Ambien caused her to have vivid dreams, trazodone 25-50 mg has not been effective  Past Medical History  Diagnosis Date  . Anxiety   . Depression   . COPD (chronic obstructive pulmonary disease)   . MS (multiple sclerosis)   . Fibromyalgia   . Impaired fasting glucose   . 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 Eating disorder   . Chronic low back pain   . PONV (postoperative nausea and vomiting)   . Chronic bronchitis     "yearly" (02/11/2013)  . Borderline diabetes   . GERD (gastroesophageal reflux disease)   . Migraine     "I use to" (02/11/2013)  . Arthritis     "hands & feet" (02/11/2013)  . Peripheral neuropathy     [name] 02/11/2013  . B12 deficiency anemia     [name] 02/11/2013  . Chronic pain syndrome     [name] 02/11/2013  . UTI (lower urinary tract infection) 02/11/2013    [name] 02/11/2013  . Chronic edema     BLE/notes 02/11/2013  . Ataxia   . Peripheral neuropathy   . Muscle weakness of lower extremity     bilateral  . Hypertension  Past Surgical History  Procedure Laterality Date  . Abdominal hysterectomy  ~ 1987; ~ 2004    "[place]; [place]" (02/11/2013)  . Cesarean section  6568; 1984; 1986  . Anterior cervical decomp/discectomy fusion  2009; 2011  . Tonsillectomy  1990's?  . Cataract extraction w/phaco  06/20/2011    Procedure: CATARACT EXTRACTION PHACO AND INTRAOCULAR LENS PLACEMENT (IOC);  Surgeon: [name] T. Gershon Crane;  Location: [facility];  Service: Ophthalmology;  Laterality: Left;  CDE 1.81  . Cataract  extraction w/phaco  07/04/2011    Procedure: CATARACT EXTRACTION PHACO AND INTRAOCULAR LENS PLACEMENT (IOC);  Surgeon: [name] T. Gershon Crane;  Location: [facility];  Service: Ophthalmology;  Laterality: Right;  CDE: 1.76  . Yag laser application Right 11/04/5168    Procedure: YAG LASER APPLICATION;  Surgeon: [name] T. Gershon Crane, MD;  Location: [facility];  Service: Ophthalmology;  Laterality: Right;  . Yag laser application Left 0/17/4944    Procedure: YAG LASER APPLICATION;  Surgeon: [name] T. Gershon Crane, MD;  Location: [facility];  Service: Ophthalmology;  Laterality: Left;  . Appendectomy    . Laparotomy N/A 09/27/2014    Procedure: Exploratory Laparotomy, Biopsy of Perforated Gastric Ulcer, Closure with omental patch;  Surgeon: [name], MD;  Location: [facility];  Service: General;  Laterality: N/A;  . Embolectomy Left 09/27/2014    Procedure: Left Brachial, Radial,Ulnar Embolectomy with patch angioplasty left brachial, radial and ulnar artery;  Surgeon: [name], MD;  Location: [facility];  Service: Vascular;  Laterality: Left;  . Embolectomy Left 09/27/2014    Procedure: Left Radial, Brachial, and Ulnar Thrombectomy; Left Brachial to Radial Bypass Graft using Greater Saphenous vein graft from Left Thigh; Left Saphenous Vein Harvest; Intraoperative Arteriogram; Intra-arterial administration of TPA;  Surgeon: [name], MD;  Location: [facility] OR;  Service: Vascular;  Laterality: Left;  . Tracheostomy N/A december 2015    Allergies  Allergen Reactions  . Ambien [Zolpidem Tartrate] Other (See Comments)    Crazy dreams  . Ceftin [Cefuroxime Axetil] Nausea And Vomiting    Pt tolerated Cefepime 09/2014 admission  . Hctz [Hydrochlorothiazide] Other (See Comments)    Urinary retention  . Lodine [Etodolac] Hives  . Promethazine Other (See Comments)    Hallucinations.   . Roxicodone [Oxycodone Hcl]   . Codeine Rash  . Latex Rash  . Penicillins Rash   History   Social History  . Marital Status: Divorced     Spouse Name: N/A  . Number of Children: N/A  . Years of Education: N/A   Occupational History  . Not on file.   Social History Main Topics  . Smoking status: Current Every Day Smoker -- 1.00 packs/day for 33 years    Types: Cigarettes  . Smokeless tobacco: Former Systems developer    Quit date: 09/08/2014  . Alcohol Use: No  . Drug Use: No  . Sexual Activity: Not Currently    Birth Control/ Protection: Surgical   Other Topics Concern  . Not on file   Social History Narrative     Review of Systems  Constitutional: Negative for fever, chills and unexpected weight change.  HENT: Negative for congestion, dental problem, ear pain, nosebleeds, postnasal drip, rhinorrhea, sinus pressure, sneezing, sore throat, trouble swallowing and voice change.   Eyes: Negative for visual disturbance.  Respiratory: Negative for cough, choking and shortness of breath.   Cardiovascular: Negative for chest pain and leg swelling.  Gastrointestinal: Negative for vomiting, abdominal pain and diarrhea.  Genitourinary: Negative for difficulty urinating.  Musculoskeletal: Negative  for arthralgias.  Skin: Negative for rash.  Neurological: Negative for tremors, syncope and headaches.  Hematological: Does not bruise/bleed easily.       Objective:   Physical Exam  Gen. Pleasant, well-nourished, in no distress, normal affect, in wheelchair ENT - no lesions, no post nasal drip, healed tracheostomy site Neck: No JVD, no thyromegaly, no carotid bruits Lungs: no use of accessory muscles, no dullness to percussion, decreased without rales or rhonchi  Cardiovascular: Rhythm regular, heart sounds  normal, no murmurs or gallops, no peripheral edema Abdomen: soft and non-tender, no hepatosplenomegaly, BS normal. Musculoskeletal: No deformities, no cyanosis or clubbing, healed scars left forearm Neuro:  alert, non focal, dysarthria       Assessment & Plan:

## 2015-02-02 NOTE — Patient Instructions (Signed)
You have insomnia which may be related to recent critical illness or to post traumatic stress Increase trazodone to 100 mg (2 tabs) every night x 2 nights If this does not help, increase to 3 tabs (150 mg ) x 2 night, then call back to report Ok to start on rehab Cancel pulmonary appt

## 2015-02-02 NOTE — Assessment & Plan Note (Signed)
You have insomnia which may be related to recent critical illness or to post traumatic stress Increase trazodone to 100 mg (2 tabs) every night x 2 nights If this does not help, increase to 3 tabs (150 mg ) x 2 night, then call back to report Ok to start on rehab Cancel pulmonary appt

## 2015-02-03 ENCOUNTER — Encounter (HOSPITAL_COMMUNITY): Payer: Self-pay | Admitting: Physical Therapy

## 2015-02-03 ENCOUNTER — Ambulatory Visit (HOSPITAL_COMMUNITY): Payer: BLUE CROSS/BLUE SHIELD | Attending: Family Medicine | Admitting: Physical Therapy

## 2015-02-03 DIAGNOSIS — R269 Unspecified abnormalities of gait and mobility: Secondary | ICD-10-CM | POA: Diagnosis not present

## 2015-02-03 DIAGNOSIS — R29898 Other symptoms and signs involving the musculoskeletal system: Secondary | ICD-10-CM | POA: Diagnosis not present

## 2015-02-03 DIAGNOSIS — Z7409 Other reduced mobility: Secondary | ICD-10-CM | POA: Insufficient documentation

## 2015-02-03 DIAGNOSIS — R293 Abnormal posture: Secondary | ICD-10-CM | POA: Diagnosis not present

## 2015-02-03 DIAGNOSIS — R279 Unspecified lack of coordination: Secondary | ICD-10-CM

## 2015-02-03 DIAGNOSIS — M6289 Other specified disorders of muscle: Secondary | ICD-10-CM | POA: Diagnosis not present

## 2015-02-03 DIAGNOSIS — M6281 Muscle weakness (generalized): Secondary | ICD-10-CM

## 2015-02-03 DIAGNOSIS — R270 Ataxia, unspecified: Secondary | ICD-10-CM | POA: Insufficient documentation

## 2015-02-03 NOTE — Patient Instructions (Signed)
Bridge   Lie back, legs bent. Inhale, pressing hips up. Keeping ribs in, lengthen lower back. Exhale, rolling down along spine from top. Repeat _10___ times. Do __2__ sessions per day.  Copyright  VHI. All rights reserved.   Quadriceps (Straight Leg Raises)   Lie flat with __0__ pound weight around left ankle. Keeping knee straight, lift __approximately 12__ inches. May do exercise sitting with back against wall or laying down in bed. Keep back straight. Hold __1-2__ seconds. Repeat __10__ times. Do __2__ sessions per day. CAUTION: Move slowly, avoid jerking.  Copyright  VHI. All rights reserved.   Functional Quadriceps: Sit to Stand   Sit on edge of chair, feet flat on floor. Stand upright, extending knees fully. Repeat __5-10__ times per set. Do __1__ sets per session. Do _2___ sessions per day.  http://orth.exer.us/735   Copyright  VHI. All rights reserved.   Toe / Heel Raise   Do sitting at the edge of your bed.Gently rock back on heels and raise toes. Then rock forward on toes and raise heels. Repeat sequence __10__ times per session. Do _2___ sessions per day   Copyright  VHI. All rights reserved.

## 2015-02-03 NOTE — Assessment & Plan Note (Signed)
She does not have significant airway obstruction. I think we should focus on smoking cessation here. She can use albuterol MDI on an as-needed basis

## 2015-02-03 NOTE — Therapy (Signed)
[place], [place], [address] Phone: [phone]   Fax:  [phone]  Physical Therapy Evaluation  Patient Details  Name: [name] MRN: [id number] Date of Birth: Jun 24, 1966 Referring Provider:  Kathyrn Drown, MD  Encounter Date: 02/03/2015      PT End of Session - 02/03/15 1458    Visit Number 1   Number of Visits 24   Date for PT Re-Evaluation 03/03/15   Authorization Type Medicare    Authorization Time Period 02/03/15 to 04/05/15   Authorization - Visit Number 1   Authorization - Number of Visits 10   PT Start Time 4097   PT Stop Time 1350   PT Time Calculation (min) 45 min   Equipment Utilized During Treatment Gait belt   Activity Tolerance Patient tolerated treatment well   Behavior During Therapy [facility] for tasks assessed/performed      Past Medical History  Diagnosis Date  . Anxiety   . Depression   . COPD (chronic obstructive pulmonary disease)   . MS (multiple sclerosis)   . Fibromyalgia   . Impaired fasting glucose   . 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 Eating disorder   . Chronic low back pain   . PONV (postoperative nausea and vomiting)   . Chronic bronchitis     "yearly" (02/11/2013)  . Borderline diabetes   . GERD (gastroesophageal reflux disease)   . Migraine     "I use to" (02/11/2013)  . Arthritis     "hands & feet" (02/11/2013)  . Peripheral neuropathy     [name] 02/11/2013  . B12 deficiency anemia     [name] 02/11/2013  . Chronic pain syndrome     [name] 02/11/2013  . UTI (lower urinary tract infection) 02/11/2013    [name] 02/11/2013  . Chronic edema     BLE/notes 02/11/2013  . Ataxia   . Peripheral neuropathy   . Muscle weakness of lower extremity     bilateral  . Hypertension     Past Surgical History  Procedure Laterality Date  . Abdominal hysterectomy  ~ 1987; ~ 2004    "wood[place]; ferguson" (02/11/2013)  . Cesarean section  3532; 1984;  1986  . Anterior cervical decomp/discectomy fusion  2009; 2011  . Tonsillectomy  1990's?  . Cataract extraction w/phaco  06/20/2011    Procedure: CATARACT EXTRACTION PHACO AND INTRAOCULAR LENS PLACEMENT (IOC);  Surgeon: [name] T. Gershon Crane;  Location: [facility];  Service: Ophthalmology;  Laterality: Left;  CDE 1.81  . Cataract extraction w/phaco  07/04/2011    Procedure: CATARACT EXTRACTION PHACO AND INTRAOCULAR LENS PLACEMENT (IOC);  Surgeon: [name] T. Gershon Crane;  Location: [facility];  Service: Ophthalmology;  Laterality: Right;  CDE: 1.76  . Yag laser application Right 9/92/4268    Procedure: YAG LASER APPLICATION;  Surgeon: [name] T. Gershon Crane, MD;  Location: [facility];  Service: Ophthalmology;  Laterality: Right;  . Yag laser application Left 3/41/9622    Procedure: YAG LASER APPLICATION;  Surgeon: [name] T. Gershon Crane, MD;  Location: [facility];  Service: Ophthalmology;  Laterality: Left;  . Appendectomy    . Laparotomy N/A 09/27/2014    Procedure: Exploratory Laparotomy, Biopsy of Perforated Gastric Ulcer, Closure with omental patch;  Surgeon: [name], MD;  Location: [facility] OR;  Service: General;  Laterality: N/A;  . Embolectomy Left 09/27/2014    Procedure: Left Brachial, Radial,Ulnar Embolectomy with patch angioplasty left brachial, radial and ulnar artery;  Surgeon: Butch Elyn  [name], MD;  Location: [facility] OR;  Service: Vascular;  Laterality: Left;  . Embolectomy Left 09/27/2014    Procedure: Left Radial, Brachial, and Ulnar Thrombectomy; Left Brachial to Radial Bypass Graft using Greater Saphenous vein graft from Left Thigh; Left Saphenous Vein Harvest; Intraoperative Arteriogram; Intra-arterial administration of TPA;  Surgeon: [name], MD;  Location: [facility] OR;  Service: Vascular;  Laterality: Left;  . Tracheostomy N/A december 2015    There were no vitals filed for this visit.  Visit Diagnosis:  Ataxia - Plan: PT plan of care cert/re-cert  Weakness of both legs - Plan: PT plan of care cert/re-cert  Proximal  muscle weakness - Plan: PT plan of care cert/re-cert  Lack of coordination - Plan: PT plan of care cert/re-cert  Poor posture - Plan: PT plan of care cert/re-cert  Impaired functional mobility and activity tolerance - Plan: PT plan of care cert/re-cert  Abnormality of gait - Plan: PT plan of care cert/re-cert      Subjective Assessment - 02/03/15 1439    Subjective Patient states that she is usually either in bed or the recliner all day, only needs a little bit of help to transfer and approx Mod(A) for ADLs. Would really like to get back to walking, as she has not been able to do this since she got sick.    Pertinent History Patient reports that she became very ill in December of last year with a major blood clot in her arm, for which she required two surgeries. Patient states she remained ill for about 3 months; she also states that before she got sick at the end of the year, she was able to get up and walk, didn't need any help for transfers either. Patient reports that her MDs suspect that she may have MS but that she doesn't agree with them.    Currently in Pain? Yes   Pain Score 8    Pain Location Other (Comment)  all over in general             [facility] PT Assessment - 02/03/15 0001    Assessment   Medical Diagnosis weakness; ataxia    Onset Date --  ataxia is chronic, weakness is since the end of last year   Next MD Visit Early and mid June    Precautions   Precautions Fall   Required Braces or Orthoses Other Brace/Splint  patient states she has bilateral AFOs at home    Restrictions   Weight Bearing Restrictions No   Balance Screen   Has the patient fallen in the past 6 months No   Has the patient had a decrease in activity level because of a fear of falling?  Yes   Is the patient reluctant to leave their home because of a fear of falling?  Yes   Prior Function   Level of Independence Independent with basic ADLs;Independent with transfers;Independent with gait;Other  (comment)  per pt, independent with mobility before illness    Vocation On disability   Observation/Other Assessments   Focus on Therapeutic Outcomes (FOTO)  88% limited   Sensation   Additional Comments Appears intact in bilateral UEs; light touch and deep pressure sensation reduced R LE    Coordination   Finger Nose Finger Test intention tremor noted B  slight intention tremor also noted heel-shin test and RAM    Posture/Postural Control   Posture Comments slight slump in WC, forward head, B shoulder IR, B ankle supination/PF   in standing,  flexed trunk, supination B, B shoulder IR   AROM   Right Knee Extension 1   Right Knee Flexion 124   Left Knee Extension 1   Left Knee Flexion 120   Right Ankle Dorsiflexion -13   Left Ankle Dorsiflexion -4   Strength   Right Hip Flexion 4-/5   Right Hip Extension 2-/5   Right Hip ABduction 2+/5   Left Hip Flexion 4-/5   Left Hip Extension 2-/5   Left Hip ABduction 2+/5   Right Knee Flexion 4/5   Right Knee Extension 4/5   Left Knee Flexion 4/5   Left Knee Extension 4/5   Right Ankle Dorsiflexion 3+/5   Left Ankle Dorsiflexion 3+/5   Bed Mobility   Rolling Right 4: Min guard   Rolling Left 4: Min guard   Supine to Sit 4: Min guard   Sitting - Scoot to [employer] of Bed 4: Min guard   Sit to Supine 4: Min guard   Scooting to [employer] 4: Min guard   Transfers   Sit to Stand 4: Min assist   Sit to Stand Details (indicate cue type and reason) B HHA   Stand to Sit 4: Min assist   Stand to [employer] Details B HHA   Stand Pivot Transfers 4: Min Arts development officer Details (indicate cue type and reason) B HHA    Ambulation/Gait   Gait Comments Side stepping along edge of bed with Min(A) and Mod cues                            PT Education - 02/03/15 1457    Education provided Yes   Education Details Prognosis, plan of care moving forward with skilled PT services, use of B AFOs to help with ankle positioning and  stretching out ankle PF groups at home, HEP    Person(s) Educated Patient   Methods Explanation;Demonstration;Tactile cues;Handout   Comprehension Verbalized understanding;Need further instruction          PT Short Term Goals - 02/03/15 1512    PT SHORT TERM GOAL #1   Title Patient will demonstrate a grade of at least 3+/5 in proximal musculature and core in order to promote functional stability and safe mobility skills    Time 6   Period Weeks   Status New   PT SHORT TERM GOAL #2   Title Patient will be able to participate in standing tasks with Min(A) from therapist and weights on limbs to reduce ataxia symptoms for at least 7 minutes at a time    Time 6   Period Weeks   Status New   PT SHORT TERM GOAL #3   Title Patient to experience no more than 4/10 at worst on a daily basis at rest and during mobility    Time 6   Period Weeks   Status New   PT SHORT TERM GOAL #4   Title Patient will demonstrate the ability to ambulate at least 2ft x6 in body weight support system with weighted extremities and minimal fatigue, good gait mechanics throughout    Time 6   Period Weeks   Status New   PT SHORT TERM GOAL #5   Title Patient will be able to perform functional transfers, including sit<->stand and stand<->pivot from wheelchair to bed with close supervision and no loss of balance    Time 6   Period Weeks   Status New   Additional  Short Term Goals   Additional Short Term Goals Yes   PT SHORT TERM GOAL #6   Title Patient will demonstrate improved posture while seated in wheelchair and will be able to state and demonstrate the importance of performing appropriate pressure relieving techniques consistently    Time 6   Period Weeks   Status New           PT Long Term Goals - 02/03/15 1522    PT LONG TERM GOAL #1   Title Patient will be independent in safely performed advanced HEP with assistance from caregivers as needed    Time 12   Period Weeks   Status New   PT LONG  TERM GOAL #2   Title Patient will demonstrate at least 4/5 to 4+/5 muscle strength in all proximal muscles, bilateral lower extremities, and core    Time 12   Period Weeks   Status New   PT LONG TERM GOAL #3   Title Patient will demonstrate bilateral ankle DF of at least 15 degrees in order to improve overall gait mechanics and functional mobility skills    Time 12   Period Weeks   Status New   PT LONG TERM GOAL #4   Title Patient will demonstrate the ability to ambulate at least 119ft with LRAD, no more than distant supervision, no loss of balance, and minimal fatigue throughout    Time 12   Period Weeks   Status New   PT LONG TERM GOAL #5   Title Patient will be independent in performing functional transfers, including all bed mobility skills and sit<->stand and stand pivot transfers with Mod(I) and LRAD    Time 12   Period Weeks   Status New   Additional Long Term Goals   Additional Long Term Goals Yes   PT LONG TERM GOAL #6   Title Patient will demonstrate the ability to stand at least 15 minutes before fatiguing and with good posture and minimal loss of balance    Time 12   Period Weeks   Status New   PT LONG TERM GOAL #7   Title Patient will regularly perform weight bearing activities at home with assistance from caregivers as needed in order to protect skin and reduce detrimental effects of being sedentary    Time 12   Period Weeks   Status New               Plan - 02/03/15 1500    Clinical Impression Statement Patient presents with gross deconditioning after severe illness, reduced strength in bilateral LEs/proximal muscles/core, difficulty with WC propulsion, postural impairments in sitting and standing, reduced range of motion in bilateral ankles, finger flexion contracture L hand , ataxia and overall impaired coordination, reduced functional mobility skills, reduced safey awareness, ankle instability especially on L, reduced gait and standing tolerance, impaired  static and dynamic balance, reduced functional task performance skills, and presents as a high fall risk. The patient states that before her most recent illness she was basically independent with mobility and was able to walk short distances with no device; however she was in the hospital about 3 months, per pt, after complications from a blood clot in her UE and has just become overall very poorly conditioned after illness. She also states that in the past her MD has mentioned the possibility of MS but that she does not believe she has this condition. Noted some ABD of R eye in resting gaze as well as some speech  difficulties; bilateral ankle supination and plantar flexion present at baseline. Patient states she already has AFOs with a piece to correct the positioning of her ankle and was encouraged to wear them for an hour at a time to assist in loosening tissues around ankle with low load, long duration stretch. Ataxia and significant coordination difficulties noted throughout mobility. Patient will benefit from skilled PT services in order to address these deficits and to assist her in reaching an optimal level of function.     Pt will benefit from skilled therapeutic intervention in order to improve on the following deficits Abnormal gait;Decreased coordination;Decreased range of motion;Difficulty walking;Impaired flexibility;Improper body mechanics;Decreased endurance;Decreased safety awareness;Impaired sensation;Postural dysfunction;Decreased activity tolerance;Decreased balance;Decreased knowledge of use of DME;Pain;Decreased mobility;Decreased strength   Rehab Potential Good   Clinical Impairments Affecting Rehab Potential chronic ataxia symptoms however patient has reportedly been able to return to walking after other bouts of illness    PT Frequency 2x / week   PT Duration 12 weeks   PT Treatment/Interventions ADLs/Self Care Home Management;Gait training;Neuromuscular re-education;Visual/perceptual  remediation/compensation;Stair training;Biofeedback;Functional mobility training;Patient/family education;Passive range of motion;Cryotherapy;Therapeutic activities;Wheelchair mobility training;Electrical Stimulation;Therapeutic exercise;Manual techniques;Energy conservation;DME Instruction;Balance training   PT Next Visit Plan Review HEP and goals; exercises for proximal musculature mat table; deep pressure massage to assist in relieving ankle tightness; functional standing tasks with therapist facilitation; gait training in body weight support system as able    PT Home Exercise Plan bridges, SLRs, ankle PF/DF, sit to stand    Consulted and Agree with Plan of Care Patient          G-Codes - 2015/02/07 1615    Functional Assessment Tool Used FOTO and skilled assessment based on functional strength, functional mobility skills, gait abnormalities, postural impairment, functional activity tolerance    Functional Limitation Mobility: Walking and moving around   Mobility: Walking and Moving Around Current Status ([id number]) At least 80 percent but less than 100 percent impaired, limited or restricted   Mobility: Walking and Moving Around Goal Status [phone]) At least 60 percent but less than 80 percent impaired, limited or restricted       Problem List Patient Active Problem List   Diagnosis Date Noted  . Insomnia 02/02/2015  . MRSA pneumonia 10/11/2014  . Abdominal abscess   . Candidemia   . Screen for STD (sexually transmitted disease)   . 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 Perforated gastric ulcer 09/27/2014  . Prediabetes 07/23/2014  . Unspecified hereditary and idiopathic peripheral neuropathy 04/15/2014  . CVA (cerebral infarction) 04/02/2014  . Hypertriglyceridemia 12/11/2013  . Stiffness of joint, not elsewhere classified, ankle and foot 11/12/2013  . Lack of coordination 11/03/2013  . Pernicious anemia 09/12/2013  . Elevated transaminase level 09/12/2013  . Ataxia 09/09/2013  .  Difficulty in walking(719.7) 09/09/2013  . Weakness of both legs 09/09/2013  . Generalized weakness 02/11/2013  . Tobacco abuse 01/14/2013  . Pedal edema 01/13/2013  . Chronic pain syndrome 12/30/2012  . Reactive airway disease 12/30/2012    Deniece Ree PT, DPT [place] [address] [place], [place], [address] Phone: [phone]   Fax:  [phone]

## 2015-02-08 ENCOUNTER — Telehealth: Payer: Self-pay | Admitting: Pulmonary Disease

## 2015-02-08 NOTE — Telephone Encounter (Signed)
Mark notified.  Rx called into pharmacy./ Nothing further needed.

## 2015-02-08 NOTE — Telephone Encounter (Signed)
Trial of Lunesta 3 mg qhs #10 tabs Call abck to report

## 2015-02-08 NOTE — Telephone Encounter (Signed)
Per 02/02/15:        Patient Instructions       You have insomnia which may be related to recent critical illness or to post traumatic stress Increase trazodone to 100 mg (2 tabs) every night x 2 nights If this does not help, increase to 3 tabs (150 mg ) x 2 night, then call back to report Ok to start on rehab Cancel pulmonary appt  --   Called spoke with pt spouse [name]. He reports pt is taking trazadone 50 mg 3 tabs at bedtime and is doing nothing at all for her. He wants something else called in for her. Please advise thanks

## 2015-02-10 ENCOUNTER — Encounter (HOSPITAL_COMMUNITY): Payer: BLUE CROSS/BLUE SHIELD | Attending: Hematology & Oncology

## 2015-02-10 ENCOUNTER — Ambulatory Visit (HOSPITAL_COMMUNITY): Payer: BLUE CROSS/BLUE SHIELD | Attending: Family Medicine | Admitting: Physical Therapy

## 2015-02-10 ENCOUNTER — Telehealth (HOSPITAL_COMMUNITY): Payer: Self-pay | Admitting: Physical Therapy

## 2015-02-10 DIAGNOSIS — I748 Embolism and thrombosis of other arteries: Secondary | ICD-10-CM | POA: Diagnosis not present

## 2015-02-10 DIAGNOSIS — I749 Embolism and thrombosis of unspecified artery: Secondary | ICD-10-CM | POA: Insufficient documentation

## 2015-02-10 DIAGNOSIS — R293 Abnormal posture: Secondary | ICD-10-CM | POA: Insufficient documentation

## 2015-02-10 DIAGNOSIS — G709 Myoneural disorder, unspecified: Secondary | ICD-10-CM | POA: Insufficient documentation

## 2015-02-10 DIAGNOSIS — M792 Neuralgia and neuritis, unspecified: Secondary | ICD-10-CM | POA: Diagnosis not present

## 2015-02-10 DIAGNOSIS — G8929 Other chronic pain: Secondary | ICD-10-CM | POA: Diagnosis not present

## 2015-02-10 DIAGNOSIS — R279 Unspecified lack of coordination: Secondary | ICD-10-CM | POA: Insufficient documentation

## 2015-02-10 DIAGNOSIS — M549 Dorsalgia, unspecified: Secondary | ICD-10-CM | POA: Diagnosis not present

## 2015-02-10 DIAGNOSIS — R270 Ataxia, unspecified: Secondary | ICD-10-CM | POA: Insufficient documentation

## 2015-02-10 DIAGNOSIS — M6289 Other specified disorders of muscle: Secondary | ICD-10-CM | POA: Insufficient documentation

## 2015-02-10 DIAGNOSIS — R29898 Other symptoms and signs involving the musculoskeletal system: Secondary | ICD-10-CM | POA: Diagnosis not present

## 2015-02-10 DIAGNOSIS — Z7409 Other reduced mobility: Secondary | ICD-10-CM | POA: Insufficient documentation

## 2015-02-10 DIAGNOSIS — R269 Unspecified abnormalities of gait and mobility: Secondary | ICD-10-CM | POA: Insufficient documentation

## 2015-02-10 LAB — ANTITHROMBIN III: ANTITHROMB III FUNC: 121 % — AB (ref 75–120)

## 2015-02-10 LAB — D-DIMER, QUANTITATIVE (NOT AT ARMC)

## 2015-02-10 NOTE — Progress Notes (Signed)
[name] presented for [employer]. Labs per MD order drawn via Peripheral Line 25 gauge needle inserted in rt ac.  Good blood return present. Procedure without incident.  Needle removed intact. Patient tolerated procedure well.

## 2015-02-10 NOTE — Telephone Encounter (Signed)
Called patient to check in since she did not show up for her 1PM appointment. Patient stated that she had another appointment at [facility] and that was why she did not come, stated that she called clinic last week to let us know. Reminded patient of time and date of next appointment (May 11th at [place]).   Deniece Ree PT, DPT [phone]

## 2015-02-11 ENCOUNTER — Encounter (HOSPITAL_COMMUNITY): Payer: Self-pay | Admitting: Hematology & Oncology

## 2015-02-11 LAB — LUPUS ANTICOAGULANT PANEL
DRVVT: 47.4 s (ref 0.0–55.1)
PTT Lupus Anticoagulant: 40.5 s (ref 0.0–50.0)

## 2015-02-11 LAB — PROTEIN C ACTIVITY: PROTEIN C ACTIVITY: 159 % — AB (ref 74–151)

## 2015-02-11 LAB — PROTEIN S, TOTAL: Protein S Ag, Total: 137 % (ref 58–150)

## 2015-02-11 LAB — PROTEIN S ACTIVITY: Protein S Activity: 73 % (ref 60–145)

## 2015-02-14 LAB — PROTEIN C, TOTAL: Protein C, Total: 119 % (ref 70–140)

## 2015-02-16 ENCOUNTER — Telehealth: Payer: Self-pay | Admitting: Pulmonary Disease

## 2015-02-16 NOTE — Telephone Encounter (Signed)
Let her know we have exhausted every other sleeping medicine

## 2015-02-16 NOTE — Telephone Encounter (Signed)
Trial of Ambien 10 mg daily at bedtime #15 Please confirm that she has not tried this before

## 2015-02-16 NOTE — Telephone Encounter (Signed)
[name] says that the pills are not helping her sleep at all, she is still staying up all night. She is not even dozing off at all.    RA - please advise.

## 2015-02-16 NOTE — Telephone Encounter (Signed)
I looked at her allergy list and [name] is listed there- causes nightmares Please advise, thanks

## 2015-02-17 ENCOUNTER — Institutional Professional Consult (permissible substitution): Payer: Self-pay | Admitting: Internal Medicine

## 2015-02-17 ENCOUNTER — Ambulatory Visit (HOSPITAL_COMMUNITY): Payer: BLUE CROSS/BLUE SHIELD | Admitting: Physical Therapy

## 2015-02-17 DIAGNOSIS — R29898 Other symptoms and signs involving the musculoskeletal system: Secondary | ICD-10-CM | POA: Diagnosis not present

## 2015-02-17 DIAGNOSIS — R293 Abnormal posture: Secondary | ICD-10-CM

## 2015-02-17 DIAGNOSIS — R270 Ataxia, unspecified: Secondary | ICD-10-CM

## 2015-02-17 DIAGNOSIS — R279 Unspecified lack of coordination: Secondary | ICD-10-CM | POA: Diagnosis not present

## 2015-02-17 DIAGNOSIS — Z7409 Other reduced mobility: Secondary | ICD-10-CM

## 2015-02-17 DIAGNOSIS — R269 Unspecified abnormalities of gait and mobility: Secondary | ICD-10-CM | POA: Diagnosis not present

## 2015-02-17 DIAGNOSIS — M6281 Muscle weakness (generalized): Secondary | ICD-10-CM

## 2015-02-17 DIAGNOSIS — M6289 Other specified disorders of muscle: Secondary | ICD-10-CM | POA: Diagnosis not present

## 2015-02-17 NOTE — Therapy (Signed)
[place], [place], [address] Phone: [phone]   Fax:  [phone]  Physical Therapy Treatment  Patient Details  Name: [name] MRN: [id number] Date of Birth: 1966-05-08 Referring Provider:  Kathyrn Drown, MD  Encounter Date: 02/17/2015      PT End of Session - 02/17/15 1548    Visit Number 2   Number of Visits 24   Date for PT Re-Evaluation 03/03/15   Authorization Type Medicare    Authorization Time Period 02/03/15 to 04/05/15   Authorization - Visit Number 2   Authorization - Number of Visits 10   Activity Tolerance Patient tolerated treatment well   Behavior During Therapy [facility] for tasks assessed/performed      Past Medical History  Diagnosis Date  . Anxiety   . Depression   . COPD (chronic obstructive pulmonary disease)   . MS (multiple sclerosis)   . Fibromyalgia   . Impaired fasting glucose   . 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 Eating disorder   . Chronic low back pain   . PONV (postoperative nausea and vomiting)   . Chronic bronchitis     "yearly" (02/11/2013)  . Borderline diabetes   . GERD (gastroesophageal reflux disease)   . Migraine     "I use to" (02/11/2013)  . Arthritis     "hands & feet" (02/11/2013)  . Peripheral neuropathy     [name] 02/11/2013  . B12 deficiency anemia     [name] 02/11/2013  . Chronic pain syndrome     [name] 02/11/2013  . UTI (lower urinary tract infection) 02/11/2013    [name] 02/11/2013  . Chronic edema     BLE/notes 02/11/2013  . Ataxia   . Peripheral neuropathy   . Muscle weakness of lower extremity     bilateral  . Hypertension     Past Surgical History  Procedure Laterality Date  . Abdominal hysterectomy  ~ 1987; ~ 2004    "[place]; [place]" (02/11/2013)  . Cesarean section  3474; 1984; 1986  . Anterior cervical decomp/discectomy fusion  2009; 2011  . Tonsillectomy  1990's?  . Cataract extraction w/phaco   06/20/2011    Procedure: CATARACT EXTRACTION PHACO AND INTRAOCULAR LENS PLACEMENT (IOC);  Surgeon: [name] T. Gershon Crane;  Location: [facility];  Service: Ophthalmology;  Laterality: Left;  CDE 1.81  . Cataract extraction w/phaco  07/04/2011    Procedure: CATARACT EXTRACTION PHACO AND INTRAOCULAR LENS PLACEMENT (IOC);  Surgeon: [name] T. Gershon Crane;  Location: [facility];  Service: Ophthalmology;  Laterality: Right;  CDE: 1.76  . Yag laser application Right 2/59/5638    Procedure: YAG LASER APPLICATION;  Surgeon: [name] T. Gershon Crane, MD;  Location: [facility];  Service: Ophthalmology;  Laterality: Right;  . Yag laser application Left 7/56/4332    Procedure: YAG LASER APPLICATION;  Surgeon: [name] T. Gershon Crane, MD;  Location: [facility];  Service: Ophthalmology;  Laterality: Left;  . Appendectomy    . Laparotomy N/A 09/27/2014    Procedure: Exploratory Laparotomy, Biopsy of Perforated Gastric Ulcer, Closure with omental patch;  Surgeon: [name], MD;  Location: [place];  Service: General;  Laterality: N/A;  . Embolectomy Left 09/27/2014    Procedure: Left Brachial, Radial,Ulnar Embolectomy with patch angioplasty left brachial, radial and ulnar artery;  Surgeon: [name], MD;  Location: [facility];  Service: Vascular;  Laterality: Left;  . Embolectomy Left 09/27/2014    Procedure: Left Radial, Brachial, and Ulnar Thrombectomy; Left  Brachial to Radial Bypass Graft using Greater Saphenous vein graft from Left Thigh; Left Saphenous Vein Harvest; Intraoperative Arteriogram; Intra-arterial administration of TPA;  Surgeon: [name], MD;  Location: [facility];  Service: Vascular;  Laterality: Left;  . Tracheostomy N/A december 2015    There were no vitals filed for this visit.  Visit Diagnosis:  Ataxia  Weakness of both legs  Proximal muscle weakness  Lack of coordination  Poor posture  Impaired functional mobility and activity tolerance  Abnormality of gait      Subjective Assessment - 02/17/15 1534    Subjective  Patient states she has been trying to get up on her feet with caregivers more at home, also states she has begun to wear ankle braces to assist in stretching her ankles. She continues to state that she really wants to get back to walking, as before she got ill she was able to walk around her house with her walker without any assistance.    Pertinent History Patient reports that she became very ill in December of last year with a major blood clot in her arm, for which she required two surgeries. Patient states she remained ill for about 3 months; she also states that before she got sick at the end of the year, she was able to get up and walk, didn't need any help for transfers either. Patient reports that her MDs suspect that she may have MS but that she doesn't agree with them.    Currently in Pain? Yes   Pain Score 5    Pain Location Other (Comment)  all over in general                          [facility] Adult PT Treatment/Exercise - 02/17/15 0001    Transfers   Sit to Stand 4: Min assist   Sit to Stand Details (indicate cue type and reason) B HHA, occasional foot/knee blocking due to slippery shoes    Stand to Sit 4: Min assist   Stand to Sit Details B HHA; occasional foot/knee blocking due to slippery shoes    Stand Pivot Transfers 4: Min assist   Stand Pivot Transfer Details (indicate cue type and reason) B HHA    Ambulation/Gait   Ambulation/Gait Yes   Ambulation/Gait Assistance 3: Mod assist   Ambulation/Gait Assistance Details PT in front of patient, with patient's hands on PT's shoulders; CLOSE wheelchair follow and knee blocking of stance leg    Ambulation Distance (Feet) 5 Feet   Gait Comments Very quick muscular fatigue, difficulty initiating steps possibly due to generalized weakness    Knee/Hip Exercises: Stretches   Passive Hamstring Stretch 2 reps;30 seconds   Passive Hamstring Stretch Limitations supine    Piriformis Stretch 2 reps;30 seconds   Piriformis  Stretch Limitations supine    Gastroc Stretch 3 reps;30 seconds   Gastroc Stretch Limitations supine    Knee/Hip Exercises: Standing   Heel Raises 1 set;10 reps   Heel Raises Limitations B HHA, parallel bars    Forward Lunges Both;1 set;10 reps   Forward Lunges Limitations 2 inch step, B HHA in parallel bars    Other Standing Knee Exercises Hip ABD , B HHA in parallel bars    Other Standing Knee Exercises Weight shifting AP and lateral with therapist facilitation for proper form                 PT Education - 02/17/15 1542  Education provided Yes   Education Details reviewed initial evaluation and goals; patient education regarding energy conservation and importance of grading exercise in PT to tolerance. Also educated that since patient bought ankle braces a couple of years ago, she may be eligible for new ones through insurance.    Person(s) Educated Patient   Methods Explanation   Comprehension Verbalized understanding          PT Short Term Goals - 02/03/15 1512    PT SHORT TERM GOAL #1   Title Patient will demonstrate a grade of at least 3+/5 in proximal musculature and core in order to promote functional stability and safe mobility skills    Time 6   Period Weeks   Status New   PT SHORT TERM GOAL #2   Title Patient will be able to participate in standing tasks with Min(A) from therapist and weights on limbs to reduce ataxia symptoms for at least 7 minutes at a time    Time 6   Period Weeks   Status New   PT SHORT TERM GOAL #3   Title Patient to experience no more than 4/10 at worst on a daily basis at rest and during mobility    Time 6   Period Weeks   Status New   PT SHORT TERM GOAL #4   Title Patient will demonstrate the ability to ambulate at least 89ft x6 in body weight support system with weighted extremities and minimal fatigue, good gait mechanics throughout    Time 6   Period Weeks   Status New   PT SHORT TERM GOAL #5   Title Patient will be able  to perform functional transfers, including sit<->stand and stand<->pivot from wheelchair to bed with close supervision and no loss of balance    Time 6   Period Weeks   Status New   Additional Short Term Goals   Additional Short Term Goals Yes   PT SHORT TERM GOAL #6   Title Patient will demonstrate improved posture while seated in wheelchair and will be able to state and demonstrate the importance of performing appropriate pressure relieving techniques consistently    Time 6   Period Weeks   Status New           PT Long Term Goals - 02/03/15 1522    PT LONG TERM GOAL #1   Title Patient will be independent in safely performed advanced HEP with assistance from caregivers as needed    Time 12   Period Weeks   Status New   PT LONG TERM GOAL #2   Title Patient will demonstrate at least 4/5 to 4+/5 muscle strength in all proximal muscles, bilateral lower extremities, and core    Time 12   Period Weeks   Status New   PT LONG TERM GOAL #3   Title Patient will demonstrate bilateral ankle DF of at least 15 degrees in order to improve overall gait mechanics and functional mobility skills    Time 12   Period Weeks   Status New   PT LONG TERM GOAL #4   Title Patient will demonstrate the ability to ambulate at least 193ft with LRAD, no more than distant supervision, no loss of balance, and minimal fatigue throughout    Time 12   Period Weeks   Status New   PT LONG TERM GOAL #5   Title Patient will be independent in performing functional transfers, including all bed mobility skills and sit<->stand and stand pivot transfers with  Mod(I) and LRAD    Time 12   Period Weeks   Status New   Additional Long Term Goals   Additional Long Term Goals Yes   PT LONG TERM GOAL #6   Title Patient will demonstrate the ability to stand at least 15 minutes before fatiguing and with good posture and minimal loss of balance    Time 12   Period Weeks   Status New   PT LONG TERM GOAL #7   Title Patient  will regularly perform weight bearing activities at home with assistance from caregivers as needed in order to protect skin and reduce detrimental effects of being sedentary    Time 12   Period Weeks   Status New               Plan - 02/17/15 1548    Clinical Impression Statement Introduced functional standing exercises and gait training today, as well as functional stretching on mat table. Patient very motivated to participate but does fatigue easily especially with gait; states that she really would love to get back to walking with a walker as this is what she was doing at home before she got sick.  Frequent rest breaks provided through session. Some trouble today in sit<->stand transfers as the bottoms of patient's shoes were a bit silppery and patient did sometimes require foot/knee blocking to perform safe transfers and prevent a fall due to her shoes. Patient enjoyed gait but did fatigue very quickly during ambulation with mod assist and will likely highly benefit from use of body weight supported system for gait training next session. Encouraged patient to check into getting new shoes on her ankle brace set up, as she has had her current ones for a couple of years.   Pt will benefit from skilled therapeutic intervention in order to improve on the following deficits Abnormal gait;Decreased coordination;Decreased range of motion;Difficulty walking;Impaired flexibility;Improper body mechanics;Decreased endurance;Decreased safety awareness;Impaired sensation;Postural dysfunction;Decreased activity tolerance;Decreased balance;Decreased knowledge of use of DME;Pain;Decreased mobility;Decreased strength   Rehab Potential Good   Clinical Impairments Affecting Rehab Potential chronic ataxia symptoms however patient has reportedly been able to return to walking after other bouts of illness    PT Frequency 2x / week   PT Duration 12 weeks   PT Treatment/Interventions ADLs/Self Care Home  Management;Gait training;Neuromuscular re-education;Visual/perceptual remediation/compensation;Stair training;Biofeedback;Functional mobility training;Patient/family education;Passive range of motion;Cryotherapy;Therapeutic activities;Wheelchair mobility training;Electrical Stimulation;Therapeutic exercise;Manual techniques;Energy conservation;DME Instruction;Balance training   PT Next Visit Plan Exercises for proximal musculature mat table; deep pressure massage to assist in relieving ankle tightness; functional standing tasks with therapist facilitation; gait training in body weight support system as able    PT Home Exercise Plan bridges, SLRs, ankle PF/DF, sit to stand    Consulted and Agree with Plan of Care Patient        Problem List Patient Active Problem List   Diagnosis Date Noted  . Insomnia 02/02/2015  . MRSA pneumonia 10/11/2014  . Abdominal abscess   . Candidemia   . Screen for STD (sexually transmitted disease)   . 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 Perforated gastric ulcer 09/27/2014  . Prediabetes 07/23/2014  . Unspecified hereditary and idiopathic peripheral neuropathy 04/15/2014  . CVA (cerebral infarction) 04/02/2014  . Hypertriglyceridemia 12/11/2013  . Stiffness of joint, not elsewhere classified, ankle and foot 11/12/2013  . Lack of coordination 11/03/2013  . Pernicious anemia 09/12/2013  . Elevated transaminase level 09/12/2013  . Ataxia 09/09/2013  . Difficulty in walking(719.7) 09/09/2013  . Weakness of  both legs 09/09/2013  . Generalized weakness 02/11/2013  . Tobacco abuse 01/14/2013  . Pedal edema 01/13/2013  . Chronic pain syndrome 12/30/2012  . Reactive airway disease 12/30/2012    Deniece Ree PT, DPT [facility] [address] [place], [place], [address] Phone: [phone]   Fax:  [phone]

## 2015-02-17 NOTE — Telephone Encounter (Signed)
lmtcb x2 for [employer].

## 2015-02-17 NOTE — Telephone Encounter (Signed)
lmtcb

## 2015-02-18 NOTE — Telephone Encounter (Signed)
Mark notified and will notify patient. Nothing further needed.

## 2015-02-19 ENCOUNTER — Ambulatory Visit (HOSPITAL_COMMUNITY): Payer: BLUE CROSS/BLUE SHIELD

## 2015-02-19 ENCOUNTER — Telehealth: Payer: Self-pay | Admitting: Pulmonary Disease

## 2015-02-19 DIAGNOSIS — R293 Abnormal posture: Secondary | ICD-10-CM

## 2015-02-19 DIAGNOSIS — R279 Unspecified lack of coordination: Secondary | ICD-10-CM | POA: Diagnosis not present

## 2015-02-19 DIAGNOSIS — R29898 Other symptoms and signs involving the musculoskeletal system: Secondary | ICD-10-CM | POA: Diagnosis not present

## 2015-02-19 DIAGNOSIS — M6289 Other specified disorders of muscle: Secondary | ICD-10-CM | POA: Diagnosis not present

## 2015-02-19 DIAGNOSIS — R270 Ataxia, unspecified: Secondary | ICD-10-CM | POA: Diagnosis not present

## 2015-02-19 DIAGNOSIS — M6281 Muscle weakness (generalized): Secondary | ICD-10-CM

## 2015-02-19 DIAGNOSIS — Z7409 Other reduced mobility: Secondary | ICD-10-CM

## 2015-02-19 DIAGNOSIS — R269 Unspecified abnormalities of gait and mobility: Secondary | ICD-10-CM

## 2015-02-19 MED ORDER — TRAZODONE HCL 50 MG PO TABS: ORAL_TABLET | ORAL | Status: DC

## 2015-02-19 NOTE — Telephone Encounter (Signed)
Okay to go back on trazodone-up to 150 mg at bedtime

## 2015-02-19 NOTE — Therapy (Signed)
[place], [place], [address] Phone: [phone]   Fax:  [phone]  Physical Therapy Treatment  Patient Details  Name: [name] MRN: [id number] Date of Birth: [birth year] Referring Provider:  Kathyrn Drown, MD  Encounter Date: 02/19/2015      PT End of Session - 02/19/15 1351    Visit Number 3   Number of Visits 24   Date for PT Re-Evaluation 03/03/15   Authorization Type Medicare    Authorization Time Period 02/03/15 to 04/05/15   Authorization - Visit Number 3   Authorization - Number of Visits 10   PT Start Time 9702   PT Stop Time 1348   PT Time Calculation (min) 45 min   Equipment Utilized During Treatment Gait belt   Activity Tolerance Patient tolerated treatment well;Patient limited by fatigue   Behavior During Therapy [facility] for tasks assessed/performed      Past Medical History  Diagnosis Date  . Anxiety   . Depression   . COPD (chronic obstructive pulmonary disease)   . MS (multiple sclerosis)   . Fibromyalgia   . Impaired fasting glucose   . 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 Eating disorder   . Chronic low back pain   . PONV (postoperative nausea and vomiting)   . Chronic bronchitis     "yearly" (02/11/2013)  . Borderline diabetes   . GERD (gastroesophageal reflux disease)   . Migraine     "I use to" (02/11/2013)  . Arthritis     "hands & feet" (02/11/2013)  . Peripheral neuropathy     [name] 02/11/2013  . B12 deficiency anemia     [name] 02/11/2013  . Chronic pain syndrome     [name] 02/11/2013  . UTI (lower urinary tract infection) 02/11/2013    [name] 02/11/2013  . Chronic edema     BLE/notes 02/11/2013  . Ataxia   . Peripheral neuropathy   . Muscle weakness of lower extremity     bilateral  . Hypertension     Past Surgical History  Procedure Laterality Date  . Abdominal hysterectomy  ~ 1987; ~ 2004    "woodward; ferguson" (02/11/2013)  .  Cesarean section  6378; 1984; 1986  . Anterior cervical decomp/discectomy fusion  2009; 2011  . Tonsillectomy  1990's?  . Cataract extraction w/phaco  06/20/2011    Procedure: CATARACT EXTRACTION PHACO AND INTRAOCULAR LENS PLACEMENT (IOC);  Surgeon: [name] T. Gershon Crane;  [facility]: [facility];  Service: Ophthalmology;  Laterality: Left;  CDE 1.81  . Cataract extraction w/phaco  07/04/2011    Procedure: CATARACT EXTRACTION PHACO AND INTRAOCULAR LENS PLACEMENT (IOC);  Surgeon: [name] T. Gershon Crane;  Location: [facility];  Service: Ophthalmology;  Laterality: Right;  CDE: 1.76  . Yag laser application Right 5/88/5027    Procedure: YAG LASER APPLICATION;  Surgeon: [name] T. Gershon Crane, MD;  Location: [facility];  Service: Ophthalmology;  Laterality: Right;  . Yag laser application Left 7/41/2878    Procedure: YAG LASER APPLICATION;  Surgeon: [name] T. Gershon Crane, MD;  [facility];  Service: Ophthalmology;  Laterality: Left;  . Appendectomy    . Laparotomy N/A 09/27/2014    Procedure: Exploratory Laparotomy, Biopsy of Perforated Gastric Ulcer, Closure with omental patch;  Surgeon: [name], MD;  Location: [facility];  Service: General;  Laterality: N/A;  . Embolectomy Left 09/27/2014    Procedure: Left Brachial, Radial,Ulnar Embolectomy with patch angioplasty left brachial, radial and ulnar artery;  Surgeon: [name], MD;  Location: [facility];  Service: Vascular;  Laterality: Left;  . Embolectomy Left 09/27/2014    Procedure: Left Radial, Brachial, and Ulnar Thrombectomy; Left Brachial to Radial Bypass Graft using Greater Saphenous vein graft from Left Thigh; Left Saphenous Vein Harvest; Intraoperative Arteriogram; Intra-arterial administration of TPA;  Surgeon: [name], MD;  Location: [facility] OR;  Service: Vascular;  Laterality: Left;  . Tracheostomy N/A december 2015    There were no vitals filed for this visit.  Visit Diagnosis:  Ataxia  Weakness of both legs  Proximal muscle weakness  Lack of  coordination  Impaired functional mobility and activity tolerance  Abnormality of gait  Poor posture      Subjective Assessment - 02/19/15 1304    Subjective Pt stated she has been trying to wakl around the house with a care giver.  WIshes to be able to walk more independently.  Pt c/o dull, achey pain in Bil feet, legs and hands.   Currently in Pain? Yes   Pain Score 6    Pain Location Generalized  Bil feet, hands and legs   Pain Orientation Right;Left   Pain Descriptors / Indicators Aching;Dull              [facility] Adult PT Treatment/Exercise - 02/19/15 0001    Transfers   Sit to Stand 4: Min assist   Sit to Stand Details (indicate cue type and reason) HHA   Stand to Sit 4: Min assist   Stand to Sit Details Cueing for handplacement/HHA   Stand Pivot Transfers 4: Min assist   Stand Pivot Transfer Details (indicate cue type and reason) handplacement   Ambulation/Gait   Ambulation/Gait Yes   Ambulation/Gait Assistance 3: Mod assist   Ambulation/Gait Assistance Details weight bearing harnness, RW therapist blocking knee of stance leg   Ambulation Distance (Feet) 10 Feet   Gait Comments Very quick muscular fatigue, difficulty initiating steps possibly due to generalized weakness    Knee/Hip Exercises: Stretches   Passive Hamstring Stretch 2 reps;30 seconds   Passive Hamstring Stretch Limitations supine    Piriformis Stretch 2 reps;30 seconds   Piriformis Stretch Limitations supine    Gastroc Stretch 3 reps;30 seconds   Gastroc Stretch Limitations supine    Knee/Hip Exercises: Standing   Heel Raises 2 sets;10 reps   Heel Raises Limitations Bil HHA in //bars   Forward Lunges Both;1 set;10 reps   Forward Lunges Limitations 2 inch step, B HHA in parallel bars    Functional Squat 10 reps   Functional Squat Limitations HHA inside //bars   Other Standing Knee Exercises Bil Hip ABD2x 10; 2RT sidestepping with Bil HHA in parallel bars    Other Standing Knee Exercises Weight  shifting AP and lateral with therapist facilitation for proper form                   PT Short Term Goals - 02/19/15 1359    PT SHORT TERM GOAL #1   Title Patient will demonstrate a grade of at least 3+/5 in proximal musculature and core in order to promote functional stability and safe mobility skills    Status On-going   PT SHORT TERM GOAL #2   Title Patient will be able to participate in standing tasks with Min(A) from therapist and weights on limbs to reduce ataxia symptoms for at least 7 minutes at a time    Status On-going   PT SHORT TERM GOAL #3   Title  Patient to experience no more than 4/10 at worst on a daily basis at rest and during mobility    Status On-going   PT SHORT TERM GOAL #4   Title Patient will demonstrate the ability to ambulate at least 16ft x6 in body weight support system with weighted extremities and minimal fatigue, good gait mechanics throughout    Status On-going   PT SHORT TERM GOAL #5   Title Patient will be able to perform functional transfers, including sit<->stand and stand<->pivot from wheelchair to bed with close supervision and no loss of balance    Status On-going   PT SHORT TERM GOAL #6   Title Patient will demonstrate improved posture while seated in wheelchair and will be able to state and demonstrate the importance of performing appropriate pressure relieving techniques consistently            PT Long Term Goals - 02/19/15 1400    PT LONG TERM GOAL #1   Title Patient will be independent in safely performed advanced HEP with assistance from caregivers as needed    PT LONG TERM GOAL #2   Title Patient will demonstrate at least 4/5 to 4+/5 muscle strength in all proximal muscles, bilateral lower extremities, and core    PT LONG TERM GOAL #3   Title Patient will demonstrate bilateral ankle DF of at least 15 degrees in order to improve overall gait mechanics and functional mobility skills    PT LONG TERM GOAL #4   Title Patient will  demonstrate the ability to ambulate at least 12ft with LRAD, no more than distant supervision, no loss of balance, and minimal fatigue throughout    PT LONG TERM GOAL #5   Title Patient will be independent in performing functional transfers, including all bed mobility skills and sit<->stand and stand pivot transfers with Mod(I) and LRAD    PT LONG TERM GOAL #6   Title Patient will demonstrate the ability to stand at least 15 minutes before fatiguing and with good posture and minimal loss of balance    PT LONG TERM GOAL #7   Title Patient will regularly perform weight bearing activities at home with assistance from caregivers as needed in order to protect skin and reduce detrimental effects of being sedentary                Plan - 02/19/15 1352    Clinical Impression Statement Began session with deep pressure stretches to reduce ankle tighntess to improve stance with walking.  Began gait training with body weight support system with mod assistance to assist with gait mechanics and therapist facilitation to reduce knee buckling.  Pt LE fatigued quickly with gait training.  Closed chain kinetic exercises complete within parallel bars with BIl HHA. Noted increased fatigue at end of session due to weakness, pt. stated pain reduced.    PT Next Visit Plan Exercises for proximal musculature mat table; deep pressure massage to assist in relieving ankle tightness; functional standing tasks with therapist facilitation; gait training in body weight support system as able         Problem List Patient Active Problem List   Diagnosis Date Noted  . Insomnia 02/02/2015  . MRSA pneumonia 10/11/2014  . Abdominal abscess   . Candidemia   . Screen for STD (sexually transmitted disease)   . 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 Perforated gastric ulcer 09/27/2014  . Prediabetes 07/23/2014  . Unspecified hereditary and idiopathic peripheral neuropathy 04/15/2014  . CVA (cerebral infarction)  04/02/2014  . Hypertriglyceridemia 12/11/2013  . Stiffness of joint, not elsewhere classified, ankle and foot 11/12/2013  . Lack of coordination 11/03/2013  . Pernicious anemia 09/12/2013  . Elevated transaminase level 09/12/2013  . Ataxia 09/09/2013  . Difficulty in walking(719.7) 09/09/2013  . Weakness of both legs 09/09/2013  . Generalized weakness 02/11/2013  . Tobacco abuse 01/14/2013  . Pedal edema 01/13/2013  . Chronic pain syndrome 12/30/2012  . Reactive airway disease 12/30/2012   Ihor Austin, [place]; [place] #[id number] [phone]  [name] 02/19/2015, 2:02 PM  [place] [address] [place], [place], [address] Phone: [phone]   Fax:  [phone]

## 2015-02-19 NOTE — Telephone Encounter (Signed)
Spoke with [employer]. States that [name] is not working for the pt. Wants to go back on Trazodone.   RA - please advise if you are okay with this.

## 2015-02-19 NOTE — Telephone Encounter (Signed)
Patient notified.  Rx called into pharmacy. Nothing further needed.

## 2015-02-23 ENCOUNTER — Ambulatory Visit (HOSPITAL_COMMUNITY): Payer: BLUE CROSS/BLUE SHIELD | Admitting: Physical Therapy

## 2015-02-23 DIAGNOSIS — M6281 Muscle weakness (generalized): Secondary | ICD-10-CM

## 2015-02-23 DIAGNOSIS — Z7409 Other reduced mobility: Secondary | ICD-10-CM

## 2015-02-23 DIAGNOSIS — R269 Unspecified abnormalities of gait and mobility: Secondary | ICD-10-CM

## 2015-02-23 DIAGNOSIS — M6289 Other specified disorders of muscle: Secondary | ICD-10-CM | POA: Diagnosis not present

## 2015-02-23 DIAGNOSIS — R270 Ataxia, unspecified: Secondary | ICD-10-CM | POA: Diagnosis not present

## 2015-02-23 DIAGNOSIS — R29898 Other symptoms and signs involving the musculoskeletal system: Secondary | ICD-10-CM | POA: Diagnosis not present

## 2015-02-23 DIAGNOSIS — R293 Abnormal posture: Secondary | ICD-10-CM | POA: Diagnosis not present

## 2015-02-23 DIAGNOSIS — R279 Unspecified lack of coordination: Secondary | ICD-10-CM

## 2015-02-23 NOTE — Therapy (Signed)
[place], [place], [address] Phone: [phone]   Fax:  [phone]  Physical Therapy Treatment  Patient Details  Name: [name] MRN: [id number] Date of Birth: 1966-10-08 Referring Provider:  Kathyrn Drown, MD  Encounter Date: 02/23/2015      PT End of Session - 02/23/15 1629    Visit Number 4   Number of Visits 24   Date for PT Re-Evaluation 03/03/15   Authorization Type Medicare    Authorization Time Period 02/03/15 to 04/05/15   Authorization - Visit Number 4   Authorization - Number of Visits 10   PT Start Time 1025   PT Stop Time 1516   PT Time Calculation (min) 38 min   Activity Tolerance Patient tolerated treatment well   Behavior During Therapy [facility] for tasks assessed/performed      Past Medical History  Diagnosis Date  . Anxiety   . Depression   . COPD (chronic obstructive pulmonary disease)   . MS (multiple sclerosis)   . Fibromyalgia   . Impaired fasting glucose   . 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 Eating disorder   . Chronic low back pain   . PONV (postoperative nausea and vomiting)   . Chronic bronchitis     "yearly" (02/11/2013)  . Borderline diabetes   . GERD (gastroesophageal reflux disease)   . Migraine     "I use to" (02/11/2013)  . Arthritis     "hands & feet" (02/11/2013)  . Peripheral neuropathy     [name] 02/11/2013  . B12 deficiency anemia     [name] 02/11/2013  . Chronic pain syndrome     [name] 02/11/2013  . UTI (lower urinary tract infection) 02/11/2013    [name] 02/11/2013  . Chronic edema     BLE/notes 02/11/2013  . Ataxia   . Peripheral neuropathy   . Muscle weakness of lower extremity     bilateral  . Hypertension     Past Surgical History  Procedure Laterality Date  . Abdominal hysterectomy  ~ 1987; ~ 2004    "woodward; ferguson" (02/11/2013)  . Cesarean section  8527; 1984; 1986  . Anterior cervical decomp/discectomy  fusion  2009; 2011  . Tonsillectomy  1990's?  . Cataract extraction w/phaco  06/20/2011    Procedure: CATARACT EXTRACTION PHACO AND INTRAOCULAR LENS PLACEMENT (IOC);  Surgeon: [name] T. Gershon Crane;  [facility]: [facility];  Service: Ophthalmology;  Laterality: Left;  CDE 1.81  . Cataract extraction w/phaco  07/04/2011    Procedure: CATARACT EXTRACTION PHACO AND INTRAOCULAR LENS PLACEMENT (IOC);  Surgeon: [name] T. Gershon Crane;  Location: [facility];  Service: Ophthalmology;  Laterality: Right;  CDE: 1.76  . Yag laser application Right 7/82/4235    Procedure: YAG LASER APPLICATION;  Surgeon: [name] T. Gershon Crane, MD;  Location: [facility];  Service: Ophthalmology;  Laterality: Right;  . Yag laser application Left 3/61/4431    Procedure: YAG LASER APPLICATION;  Surgeon: [name] T. Gershon Crane, MD;  Location: [facility];  Service: Ophthalmology;  Laterality: Left;  . Appendectomy    . Laparotomy N/A 09/27/2014    Procedure: Exploratory Laparotomy, Biopsy of Perforated Gastric Ulcer, Closure with omental patch;  Surgeon: [name], MD;  Location: [facility];  Service: General;  Laterality: N/A;  . Embolectomy Left 09/27/2014    Procedure: Left Brachial, Radial,Ulnar Embolectomy with patch angioplasty left brachial, radial and ulnar artery;  Surgeon: [name], MD;  Location: [facility];  Service:  Vascular;  Laterality: Left;  . Embolectomy Left 09/27/2014    Procedure: Left Radial, Brachial, and Ulnar Thrombectomy; Left Brachial to Radial Bypass Graft using Greater Saphenous vein graft from Left Thigh; Left Saphenous Vein Harvest; Intraoperative Arteriogram; Intra-arterial administration of TPA;  Surgeon: [name], MD;  Location: [facility] OR;  Service: Vascular;  Laterality: Left;  . Tracheostomy N/A december 2015    There were no vitals filed for this visit.  Visit Diagnosis:  Ataxia  Weakness of both legs  Proximal muscle weakness  Lack of coordination  Impaired functional mobility and activity tolerance  Abnormality of  gait  Poor posture      Subjective Assessment - 02/23/15 1624    Subjective Patient reports she is feeling better, reports that she is continuing to try to spend more time on her feet and weight bearing as she can at home. Continues to complain of discomfort in L hand with contracted fingers.    Pertinent History Patient reports that she became very ill in December of last year with a major blood clot in her arm, for which she required two surgeries. Patient states she remained ill for about 3 months; she also states that before she got sick at the end of the year, she was able to get up and walk, didn't need any help for transfers either. Patient reports that her MDs suspect that she may have MS but that she doesn't agree with them.                          [facility] Adult PT Treatment/Exercise - 02/23/15 0001    Ambulation/Gait   Ambulation/Gait Yes   Ambulation/Gait Assistance 4: Min assist   Ambulation/Gait Assistance Details standard walker    Ambulation Distance (Feet) --  76ft, 1ft   Gait Comments Quick muscular fatigue, difficulty with step initiation and some knee hyperextenstion and tendency to lean posteriorly however her walking did improve with use of walker    Knee/Hip Exercises: Stretches   Gastroc Stretch 2 reps;30 seconds   Gastroc Stretch Limitations seated    Knee/Hip Exercises: Standing   Heel Raises 1 set;10 reps   Heel Raises Limitations walker    Forward Lunges Both;1 set;10 reps   Forward Lunges Limitations 2 inch step, walker    Forward Step Up Both;1 set;10 reps   Forward Step Up Limitations 2 inch step, walker    Other Standing Knee Exercises Sit to stand 1x10 with walker and cues for safe ascent/controlled descent 1x10   Other Standing Knee Exercises Standing marches 1x20, walker    Manual Therapy   Manual Therapy Soft tissue mobilization   Soft tissue mobilization deep pressure mobilization of bilateral distal gastrocs and achilles; gentle  soft tissue mobilziation of contracted fingers and shortened tissues L wrist/hand                 PT Education - 02/23/15 1629    Education provided Yes   Education Details education on strategies to care for contracted hand at home, including use of washcloths and having caregivers provide a gentle stretch to fingers with wrist flexed    Person(s) Educated Patient   Methods Explanation   Comprehension Verbalized understanding          PT Short Term Goals - 02/19/15 1359    PT SHORT TERM GOAL #1   Title Patient will demonstrate a grade of at least 3+/5 in proximal musculature and core in order  to promote functional stability and safe mobility skills    Status On-going   PT SHORT TERM GOAL #2   Title Patient will be able to participate in standing tasks with Min(A) from therapist and weights on limbs to reduce ataxia symptoms for at least 7 minutes at a time    Status On-going   PT [place] #3   Title Patient to experience no more than 4/10 at worst on a daily basis at rest and during mobility    Status On-going   PT SHORT TERM GOAL #4   Title Patient will demonstrate the ability to ambulate at least 26ft x6 in body weight support system with weighted extremities and minimal fatigue, good gait mechanics throughout    Status On-going   PT SHORT TERM GOAL #5   Title Patient will be able to perform functional transfers, including sit<->stand and stand<->pivot from wheelchair to bed with close supervision and no loss of balance    Status On-going   PT SHORT TERM GOAL #6   Title Patient will demonstrate improved posture while seated in wheelchair and will be able to state and demonstrate the importance of performing appropriate pressure relieving techniques consistently            PT Long Term Goals - 02/19/15 1400    PT LONG TERM GOAL #1   Title Patient will be independent in safely performed advanced HEP with assistance from caregivers as needed    PT LONG TERM  GOAL #2   Title Patient will demonstrate at least 4/5 to 4+/5 muscle strength in all proximal muscles, bilateral lower extremities, and core    PT LONG TERM GOAL #3   Title Patient will demonstrate bilateral ankle DF of at least 15 degrees in order to improve overall gait mechanics and functional mobility skills    PT LONG TERM GOAL #4   Title Patient will demonstrate the ability to ambulate at least 157ft with LRAD, no more than distant supervision, no loss of balance, and minimal fatigue throughout    PT LONG TERM GOAL #5   Title Patient will be independent in performing functional transfers, including all bed mobility skills and sit<->stand and stand pivot transfers with Mod(I) and LRAD    PT LONG TERM GOAL #6   Title Patient will demonstrate the ability to stand at least 15 minutes before fatiguing and with good posture and minimal loss of balance    PT LONG TERM GOAL #7   Title Patient will regularly perform weight bearing activities at home with assistance from caregivers as needed in order to protect skin and reduce detrimental effects of being sedentary                Plan - 02/23/15 1630    Clinical Impression Statement Began session with gait today, initially 81ft with Mod(A) from therapist and then able to do approx 34ft with Min guard-Min(A) from therapist and standard walker. Deep pressure massage to bilateral distal gastrocs and achilles, also began to address contracted hand today. Continued functional exercises with rest breaks provided so as to avoid over-fatiguing patient. Patient continues to demonstrate significant weakness but is improving in activity tolerance, gait distance, and strength at this time. Continues to demonstrate unsafe stand to sit stransfers, which was addressed during today's session.    Pt will benefit from skilled therapeutic intervention in order to improve on the following deficits Abnormal gait;Decreased coordination;Decreased range of  motion;Difficulty walking;Impaired flexibility;Improper body mechanics;Decreased endurance;Decreased safety awareness;Impaired  sensation;Postural dysfunction;Decreased activity tolerance;Decreased balance;Decreased knowledge of use of DME;Pain;Decreased mobility;Decreased strength   Rehab Potential Good   Clinical Impairments Affecting Rehab Potential chronic ataxia symptoms however patient has reportedly been able to return to walking after other bouts of illness    PT Frequency 2x / week   PT Duration 12 weeks   PT Treatment/Interventions ADLs/Self Care Home Management;Gait training;Neuromuscular re-education;Visual/perceptual remediation/compensation;Stair training;Biofeedback;Functional mobility training;Patient/family education;Passive range of motion;Cryotherapy;Therapeutic activities;Wheelchair mobility training;Electrical Stimulation;Therapeutic exercise;Manual techniques;Energy conservation;DME Instruction;Balance training   PT Next Visit Plan Exercises for proximal musculature mat table; deep pressure massage to assist in relieving ankle tightness; functional standing tasks with therapist facilitation; gait training with standard walker; continue to address hand contracture UNTIL/IF OT services start   PT Home Exercise Plan bridges, SLRs, ankle PF/DF, sit to stand    Consulted and Agree with Plan of Care Patient        Problem List Patient Active Problem List   Diagnosis Date Noted  . Insomnia 02/02/2015  . MRSA pneumonia 10/11/2014  . Abdominal abscess   . Candidemia   . Screen for STD (sexually transmitted disease)   . 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 Perforated gastric ulcer 09/27/2014  . Prediabetes 07/23/2014  . Unspecified hereditary and idiopathic peripheral neuropathy 04/15/2014  . CVA (cerebral infarction) 04/02/2014  . Hypertriglyceridemia 12/11/2013  . Stiffness of joint, not elsewhere classified, ankle and foot 11/12/2013  . Lack of coordination 11/03/2013   . Pernicious anemia 09/12/2013  . Elevated transaminase level 09/12/2013  . Ataxia 09/09/2013  . Difficulty in walking(719.7) 09/09/2013  . Weakness of both legs 09/09/2013  . Generalized weakness 02/11/2013  . Tobacco abuse 01/14/2013  . Pedal edema 01/13/2013  . Chronic pain syndrome 12/30/2012  . Reactive airway disease 12/30/2012    Deniece Ree PT, DPT [facility] [address] [place], [place], [address] Phone: [phone]   Fax:  [phone]

## 2015-02-25 ENCOUNTER — Ambulatory Visit (HOSPITAL_COMMUNITY): Payer: BLUE CROSS/BLUE SHIELD | Admitting: Physical Therapy

## 2015-02-25 DIAGNOSIS — M6289 Other specified disorders of muscle: Secondary | ICD-10-CM | POA: Diagnosis not present

## 2015-02-25 DIAGNOSIS — M6281 Muscle weakness (generalized): Secondary | ICD-10-CM

## 2015-02-25 DIAGNOSIS — R279 Unspecified lack of coordination: Secondary | ICD-10-CM | POA: Diagnosis not present

## 2015-02-25 DIAGNOSIS — R29898 Other symptoms and signs involving the musculoskeletal system: Secondary | ICD-10-CM | POA: Diagnosis not present

## 2015-02-25 DIAGNOSIS — R293 Abnormal posture: Secondary | ICD-10-CM

## 2015-02-25 DIAGNOSIS — R270 Ataxia, unspecified: Secondary | ICD-10-CM

## 2015-02-25 DIAGNOSIS — R269 Unspecified abnormalities of gait and mobility: Secondary | ICD-10-CM

## 2015-02-25 DIAGNOSIS — Z7409 Other reduced mobility: Secondary | ICD-10-CM

## 2015-02-25 NOTE — Therapy (Signed)
[place], [place], [address] Phone: [phone]   Fax:  [phone]  Physical Therapy Treatment  Patient Details  Name: [name] MRN: [id number] Date of Birth: 04-Jul-1966 Referring Provider:  Kathyrn Drown, MD  Encounter Date: 02/25/2015      PT End of Session - 02/25/15 1638    Visit Number 5   Number of Visits 24   Date for PT Re-Evaluation 03/03/15   Authorization Type Medicare    Authorization Time Period 02/03/15 to 04/05/15   Authorization - Visit Number 5   Authorization - Number of Visits 10   PT Start Time 5035   PT Stop Time 4656   PT Time Calculation (min) 42 min   Equipment Utilized During Treatment Gait belt   Activity Tolerance Patient tolerated treatment well   Behavior During Therapy [facility] for tasks assessed/performed      Past Medical History  Diagnosis Date  . Anxiety   . Depression   . COPD (chronic obstructive pulmonary disease)   . MS (multiple sclerosis)   . Fibromyalgia   . Impaired fasting glucose   . 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 Eating disorder   . Chronic low back pain   . PONV (postoperative nausea and vomiting)   . Chronic bronchitis     "yearly" (02/11/2013)  . Borderline diabetes   . GERD (gastroesophageal reflux disease)   . Migraine     "I use to" (02/11/2013)  . Arthritis     "hands & feet" (02/11/2013)  . Peripheral neuropathy     [name] 02/11/2013  . B12 deficiency anemia     [name] 02/11/2013  . Chronic pain syndrome     [name] 02/11/2013  . UTI (lower urinary tract infection) 02/11/2013    [name] 02/11/2013  . Chronic edema     BLE/notes 02/11/2013  . Ataxia   . Peripheral neuropathy   . Muscle weakness of lower extremity     bilateral  . Hypertension     Past Surgical History  Procedure Laterality Date  . Abdominal hysterectomy  ~ 1987; ~ 2004    "[place]; [place]" (02/11/2013)  . Cesarean section  8127; 1984;  1986  . Anterior cervical decomp/discectomy fusion  2009; 2011  . Tonsillectomy  1990's?  . Cataract extraction w/phaco  06/20/2011    Procedure: CATARACT EXTRACTION PHACO AND INTRAOCULAR LENS PLACEMENT (IOC);  Surgeon: [name] T. Gershon Crane;  Location: [facility];  Service: Ophthalmology;  Laterality: Left;  CDE 1.81  . Cataract extraction w/phaco  07/04/2011    Procedure: CATARACT EXTRACTION PHACO AND INTRAOCULAR LENS PLACEMENT (IOC);  Surgeon: [name] T. Gershon Crane;  Location: [facility];  Service: Ophthalmology;  Laterality: Right;  CDE: 1.76  . Yag laser application Right 02/23/16    Procedure: YAG LASER APPLICATION;  Surgeon: [name] T. Gershon Crane, MD;  Location: [facility];  Service: Ophthalmology;  Laterality: Right;  . Yag laser application Left 4/94/4967    Procedure: YAG LASER APPLICATION;  Surgeon: [name] T. Gershon Crane, MD;  Location: [facility];  Service: Ophthalmology;  Laterality: Left;  . Appendectomy    . Laparotomy N/A 09/27/2014    Procedure: Exploratory Laparotomy, Biopsy of Perforated Gastric Ulcer, Closure with omental patch;  Surgeon: [name], MD;  Location: [facility] OR;  Service: General;  Laterality: N/A;  . Embolectomy Left 09/27/2014    Procedure: Left Brachial, Radial,Ulnar Embolectomy with patch angioplasty left brachial, radial and ulnar artery;  Surgeon: [name],  MD;  Location: [facility] OR;  Service: Vascular;  Laterality: Left;  . Embolectomy Left 09/27/2014    Procedure: Left Radial, Brachial, and Ulnar Thrombectomy; Left Brachial to Radial Bypass Graft using Greater Saphenous vein graft from Left Thigh; Left Saphenous Vein Harvest; Intraoperative Arteriogram; Intra-arterial administration of TPA;  Surgeon: [name], MD;  Location: [facility] OR;  Service: Vascular;  Laterality: Left;  . Tracheostomy N/A december 2015    There were no vitals filed for this visit.  Visit Diagnosis:  Ataxia  Weakness of both legs  Proximal muscle weakness  Lack of coordination  Impaired functional mobility  and activity tolerance  Abnormality of gait  Poor posture      Subjective Assessment - 02/25/15 1631    Subjective Patient reports that she is doing very well, states that she has been walking more at home and is not having a lot of problems with fatigue after treatment sessions.    Pertinent History Patient reports that she became very ill in December of last year with a major blood clot in her arm, for which she required two surgeries. Patient states she remained ill for about 3 months; she also states that before she got sick at the end of the year, she was able to get up and walk, didn't need any help for transfers either. Patient reports that her MDs suspect that she may have MS but that she doesn't agree with them.    Currently in Pain? No/denies                         [facility] Adult PT Treatment/Exercise - 02/25/15 0001    Ambulation/Gait   Ambulation/Gait Yes   Ambulation/Gait Assistance 4: Min guard   Ambulation/Gait Assistance Details standard walker    Ambulation Distance (Feet) --  140ft, 72ft (approximate values)   Gait Comments Narrow base of support, flexed trunk, occasional LOB, reduced B dorsiflexion, improving gait tolerance    Knee/Hip Exercises: Stretches   Passive Hamstring Stretch 3 reps;30 seconds   Passive Hamstring Stretch Limitations supine    Piriformis Stretch 2 reps;30 seconds   Piriformis Stretch Limitations supine    Gastroc Stretch 2 reps;30 seconds   Gastroc Stretch Limitations supine    Knee/Hip Exercises: Standing   Forward Lunges Both;1 set;15 reps   Forward Lunges Limitations 2 inch step, B support on walker in parallel bars    Manual Therapy   Manual Therapy Soft tissue mobilization   Soft tissue mobilization deep pressure mobilization of bilateral distal gastrocs and achilles; gentle soft tissue mobilziation of contracted fingers and shortened tissues L wrist/hand                 PT Education - 02/25/15 1637     Education provided Yes   Education Details education regarding energy conservation at home and in clinic    Person(s) Educated Patient   Methods Explanation   Comprehension Verbalized understanding          PT Short Term Goals - 02/19/15 1359    PT SHORT TERM GOAL #1   Title Patient will demonstrate a grade of at least 3+/5 in proximal musculature and core in order to promote functional stability and safe mobility skills    Status On-going   PT SHORT TERM GOAL #2   Title Patient will be able to participate in standing tasks with Min(A) from therapist and weights on limbs to reduce ataxia symptoms for at least 7 minutes  at a time    Status On-going   PT SHORT TERM GOAL #3   Title Patient to experience no more than 4/10 at worst on a daily basis at rest and during mobility    Status On-going   PT SHORT TERM GOAL #4   Title Patient will demonstrate the ability to ambulate at least 27ft x6 in body weight support system with weighted extremities and minimal fatigue, good gait mechanics throughout    Status On-going   PT SHORT TERM GOAL #5   Title Patient will be able to perform functional transfers, including sit<->stand and stand<->pivot from wheelchair to bed with close supervision and no loss of balance    Status On-going   PT SHORT TERM GOAL #6   Title Patient will demonstrate improved posture while seated in wheelchair and will be able to state and demonstrate the importance of performing appropriate pressure relieving techniques consistently            PT Long Term Goals - 02/19/15 1400    PT LONG TERM GOAL #1   Title Patient will be independent in safely performed advanced HEP with assistance from caregivers as needed    PT LONG TERM GOAL #2   Title Patient will demonstrate at least 4/5 to 4+/5 muscle strength in all proximal muscles, bilateral lower extremities, and core    PT LONG TERM GOAL #3   Title Patient will demonstrate bilateral ankle DF of at least 15 degrees in  order to improve overall gait mechanics and functional mobility skills    PT LONG TERM GOAL #4   Title Patient will demonstrate the ability to ambulate at least 161ft with LRAD, no more than distant supervision, no loss of balance, and minimal fatigue throughout    PT LONG TERM GOAL #5   Title Patient will be independent in performing functional transfers, including all bed mobility skills and sit<->stand and stand pivot transfers with Mod(I) and LRAD    PT LONG TERM GOAL #6   Title Patient will demonstrate the ability to stand at least 15 minutes before fatiguing and with good posture and minimal loss of balance    PT LONG TERM GOAL #7   Title Patient will regularly perform weight bearing activities at home with assistance from caregivers as needed in order to protect skin and reduce detrimental effects of being sedentary                Plan - 02/25/15 1638    Clinical Impression Statement Began session with functional stretching and manual to distal gastrocs, then proceeded with gait training with significant improvements in distance today. Patient significantly fatigued after gait distances and only able to complete one standing exercise before becoming increasingly fatigued. Also continued to provided manual stretching  to L wrist and hand to reduce contracture and improve functional use of upper extremity.  Education provided regarding energy conservation at clinic and at home. Patient appears very pleased with and excited about the progress she is making at this time.    Pt will benefit from skilled therapeutic intervention in order to improve on the following deficits Abnormal gait;Decreased coordination;Decreased range of motion;Difficulty walking;Impaired flexibility;Improper body mechanics;Decreased endurance;Decreased safety awareness;Impaired sensation;Postural dysfunction;Decreased activity tolerance;Decreased balance;Decreased knowledge of use of DME;Pain;Decreased  mobility;Decreased strength   Rehab Potential Good   Clinical Impairments Affecting Rehab Potential chronic ataxia symptoms however patient has reportedly been able to return to walking after other bouts of illness    PT Frequency 2x /  week   PT Duration 12 weeks   PT Treatment/Interventions ADLs/Self Care Home Management;Gait training;Neuromuscular re-education;Visual/perceptual remediation/compensation;Stair training;Biofeedback;Functional mobility training;Patient/family education;Passive range of motion;Cryotherapy;Therapeutic activities;Wheelchair mobility training;Electrical Stimulation;Therapeutic exercise;Manual techniques;Energy conservation;DME Instruction;Balance training   PT Next Visit Plan Exercises for proximal musculature mat table; deep pressure massage to assist in relieving ankle tightness; functional standing tasks with therapist facilitation; gait training with standard walker; continue to address hand contracture UNTIL/IF OT services start   PT Home Exercise Plan bridges, SLRs, ankle PF/DF, sit to stand    Consulted and Agree with Plan of Care Patient        Problem List Patient Active Problem List   Diagnosis Date Noted  . Insomnia 02/02/2015  . MRSA pneumonia 10/11/2014  . Abdominal abscess   . Candidemia   . Screen for STD (sexually transmitted disease)   . 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 Perforated gastric ulcer 09/27/2014  . Prediabetes 07/23/2014  . Unspecified hereditary and idiopathic peripheral neuropathy 04/15/2014  . CVA (cerebral infarction) 04/02/2014  . Hypertriglyceridemia 12/11/2013  . Stiffness of joint, not elsewhere classified, ankle and foot 11/12/2013  . Lack of coordination 11/03/2013  . Pernicious anemia 09/12/2013  . Elevated transaminase level 09/12/2013  . Ataxia 09/09/2013  . Difficulty in walking(719.7) 09/09/2013  . Weakness of both legs 09/09/2013  . Generalized weakness 02/11/2013  . Tobacco abuse 01/14/2013  . Pedal  edema 01/13/2013  . Chronic pain syndrome 12/30/2012  . Reactive airway disease 12/30/2012    Deniece Ree PT, DPT [facility] [address] [place], [place], [address] Phone: [phone]   Fax:  [phone]

## 2015-03-02 ENCOUNTER — Ambulatory Visit (INDEPENDENT_AMBULATORY_CARE_PROVIDER_SITE_OTHER): Payer: BLUE CROSS/BLUE SHIELD | Admitting: Adult Health

## 2015-03-02 ENCOUNTER — Encounter: Payer: Self-pay | Admitting: Adult Health

## 2015-03-02 ENCOUNTER — Encounter (HOSPITAL_COMMUNITY): Payer: Self-pay | Admitting: Physical Therapy

## 2015-03-02 VITALS — BP 100/64 | HR 63 | Temp 98.1°F

## 2015-03-02 DIAGNOSIS — I749 Embolism and thrombosis of unspecified artery: Secondary | ICD-10-CM | POA: Diagnosis not present

## 2015-03-02 DIAGNOSIS — G4700 Insomnia, unspecified: Secondary | ICD-10-CM | POA: Diagnosis not present

## 2015-03-02 NOTE — Patient Instructions (Signed)
Continue on Trazodone As needed  Insomnia   Healthy sleep regimen  We are setting you up for an overnight oximetry test.  Discuss with Primary MD regarding anxiety issues .  follow up Dr. Elsworth Soho  In 6-8 weeks and As needed

## 2015-03-02 NOTE — Assessment & Plan Note (Signed)
Difficult to control ? Underlying anxiety /PTSD contributing to symtpoms  Would check ONO as underlying neurological disorder could have some nocturnal desats   Plan  Continue on Trazodone As needed  Insomnia   Healthy sleep regimen  We are setting you up for an overnight oximetry test.  Discuss with Primary MD regarding anxiety issues .  follow up Dr. Elsworth Soho  In 6-8 weeks and As needed

## 2015-03-02 NOTE — Progress Notes (Signed)
Subjective:    Patient ID: [name], female    DOB: 12-23-1965, 49 y.o.   MRN: [id number]  HPI  40yof smoker, critical illness survivor, presents for evaluation of insomnia PMH of neurological illness that has been variously described as MS, depression, fibromyalgia, peripheral neuropathy, Guillian Barre syndrome. Admitted 09/26/14- 10/18/14 with pneumoperitoneum, cold L hand due to arterial clot , then to [facility] with [facility], then to NH at [facility] until 2/28, now home, PT to start [facility] tomorrow  12/20 ex-lap [name]) with omental patch for perforated prepyloric gastric ulcer; L brachial and ulnar artery embolectomy ([place]) 12/20 Left brachial to left radial artery bypass with left leg greater saphenous vein for recurrent occlusion  She suffered a brief cardiac arrest on 1/1 01/06 Trach Course was complicated by MRSA and Pseudomonas PNA, Candida glabrata peritonitis and fungemia  Significant tests/ events  09/26/14 CT abd/pelvis >> ischemic proximal SB with pneumoperitoneum and ascites 12/20 CT angio chest >> thrombus Lt Riva artery 12/20 LE doppler >> negative 12/21 echo >> nml LVEF 12/26 CT abdomen >> pelvic fluid 12/28 TEE negative 12/31 CT head >> chronic infarcts of globus pallidi b/l 01/01 CT abd/pelvis >> no obstruction, perforation, or abscess 01/08 EEG >> background slowing, no epileptiform activity 02/02/15, spirometry -mild airway obstruction, ratio 71, FEV1 78%, FVC 95% , mild flattening of the expiratory loop  She presents today for evaluation of insomnia and pulmonary issues. She worked the third shift for 12 years in the past, had been disabled for 6 years. She has been going through this neurological illness that has not been well characterized. She was hospitalized for 4 months at [facility] ICU in 2014 followed by a month in December 2015. Ever since discharge to home in February 2016, she has been unable to sleep for more than 4 hours every night. She does not  have a fixed bedtime-can vary from 8 PM to midnight. Sleep latency is prolonged, she sleeps on her right side with 2 pillows, reports 4-5 nocturnal awakenings with prolonged sleep latency. She wakes up feeling tired with occasional dryness of mouth. No snoring has been noted. Epworth sleepiness score is 8 There is no history suggestive of cataplexy, sleep paralysis or parasomnias She smoked more than a pack per day for 30 years  Ambien caused her to have vivid dreams, trazodone 25-50 mg has not been effective  03/02/2015 Follow up : Insomnia  Returns for  1 month follow up .  Seen last ov for consult for insomnia and pulm issues .  Started on  Trazodone 50mg  3 tabs At bedtime  . Tried lunesta without any help.  Unable to tolerate ambien.  Sleeps only 2-3 hrs each nights.  On chronic narcotics with chronic pain /neuropathy. -on percocet 3-4 tabs daily.  Naps during daytime for 1 hr.  Has daily anxiety and fear of not waking up  Has has chronic illness over last 5-6 yrs with critical illness x 2 in last 2 years.  We discussed healthy sleep regimen.  Discussed follow up with PCP for anixety and possible counseling.  Spirometry last ov was normal.  Has ongoing neuropathy/neurological disorder followed by [facility] .     Review of Systems  Constitutional: Negative for fever, chills and unexpected weight change.  HENT: Negative for congestion, dental problem, ear pain, nosebleeds, postnasal drip, rhinorrhea, sinus pressure, sneezing, sore throat, trouble swallowing and voice change.   Eyes: Negative for visual disturbance.  Respiratory: Negative for cough, choking and shortness of  breath.   Cardiovascular: Negative for chest pain and leg swelling.  Gastrointestinal: Negative for vomiting, abdominal pain and diarrhea.  Genitourinary: Negative for difficulty urinating.  Musculoskeletal: Negative for arthralgias.  Skin: Negative for rash.  Neurological: Negative for tremors, syncope and  headaches.  Hematological: Does not bruise/bleed easily.       Objective:   Physical Exam  Gen. Pleasant, well-nourished, in no distress, normal affect, in wheelchair ENT - no lesions, no post nasal drip, healed tracheostomy site Neck: No JVD, no thyromegaly, no carotid bruits Lungs: no use of accessory muscles, no dullness to percussion, decreased without rales or rhonchi  Cardiovascular: Rhythm regular, heart sounds  normal, no murmurs or gallops, no peripheral edema Abdomen: soft and non-tender, no hepatosplenomegaly, BS normal. Musculoskeletal: No deformities, no cyanosis or clubbing, healed scars left forearm Neuro:  alert, non focal, dysarthria       Assessment & Plan:

## 2015-03-04 ENCOUNTER — Ambulatory Visit (HOSPITAL_COMMUNITY): Payer: BLUE CROSS/BLUE SHIELD | Admitting: Physical Therapy

## 2015-03-04 DIAGNOSIS — M6281 Muscle weakness (generalized): Secondary | ICD-10-CM

## 2015-03-04 DIAGNOSIS — Z7409 Other reduced mobility: Secondary | ICD-10-CM

## 2015-03-04 DIAGNOSIS — R270 Ataxia, unspecified: Secondary | ICD-10-CM

## 2015-03-04 DIAGNOSIS — R29898 Other symptoms and signs involving the musculoskeletal system: Secondary | ICD-10-CM

## 2015-03-04 DIAGNOSIS — R279 Unspecified lack of coordination: Secondary | ICD-10-CM

## 2015-03-04 DIAGNOSIS — R293 Abnormal posture: Secondary | ICD-10-CM | POA: Diagnosis not present

## 2015-03-04 DIAGNOSIS — R269 Unspecified abnormalities of gait and mobility: Secondary | ICD-10-CM

## 2015-03-04 DIAGNOSIS — M6289 Other specified disorders of muscle: Secondary | ICD-10-CM | POA: Diagnosis not present

## 2015-03-04 NOTE — Therapy (Signed)
[place], [place], [address] Phone: [phone]   Fax:  [phone]  Physical Therapy Treatment  Patient Details  Name: [name] MRN: [id number] Date of Birth: 02/24/1966 Referring Provider:  Kathyrn Drown, MD  Encounter Date: 03/04/2015      PT End of Session - 03/04/15 1352    Visit Number 6   Number of Visits 24   Date for PT Re-Evaluation 04/01/15   Authorization Type Medicare    Authorization Time Period 02/03/15 to 04/05/15; G-code done 6th visit    Authorization - Visit Number 6   Authorization - Number of Visits 10   PT Start Time 1307   PT Stop Time 1345   PT Time Calculation (min) 38 min   Equipment Utilized During Treatment Gait belt   Activity Tolerance Patient tolerated treatment well   Behavior During Therapy [facility] for tasks assessed/performed      Past Medical History  Diagnosis Date  . Anxiety   . Depression   . COPD (chronic obstructive pulmonary disease)   . MS (multiple sclerosis)   . Fibromyalgia   . Impaired fasting glucose   . 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 Eating disorder   . Chronic low back pain   . PONV (postoperative nausea and vomiting)   . Chronic bronchitis     "yearly" (02/11/2013)  . Borderline diabetes   . GERD (gastroesophageal reflux disease)   . Migraine     "I use to" (02/11/2013)  . Arthritis     "hands & feet" (02/11/2013)  . Peripheral neuropathy     [name] 02/11/2013  . B12 deficiency anemia     [name] 02/11/2013  . Chronic pain syndrome     [name] 02/11/2013  . UTI (lower urinary tract infection) 02/11/2013    [name] 02/11/2013  . Chronic edema     BLE/notes 02/11/2013  . Ataxia   . Peripheral neuropathy   . Muscle weakness of lower extremity     bilateral  . Hypertension     Past Surgical History  Procedure Laterality Date  . Abdominal hysterectomy  ~ 1987; ~ 2004    "[place]; [place]" (02/11/2013)  . Cesarean  section  8242; 1984; 1986  . Anterior cervical decomp/discectomy fusion  2009; 2011  . Tonsillectomy  1990's?  . Cataract extraction w/phaco  06/20/2011    Procedure: CATARACT EXTRACTION PHACO AND INTRAOCULAR LENS PLACEMENT (IOC);  Surgeon: [name] T. Gershon Crane;  Location: [facility];  Service: Ophthalmology;  Laterality: Left;  CDE 1.81  . Cataract extraction w/phaco  07/04/2011    Procedure: CATARACT EXTRACTION PHACO AND INTRAOCULAR LENS PLACEMENT (IOC);  Surgeon: [name] T. Gershon Crane;  Location: [facility];  Service: Ophthalmology;  Laterality: Right;  CDE: 1.76  . Yag laser application Right 3/53/6144    Procedure: YAG LASER APPLICATION;  Surgeon: [name] T. Gershon Crane, MD;  Location: [facility];  Service: Ophthalmology;  Laterality: Right;  . Yag laser application Left 12/22/4006    Procedure: YAG LASER APPLICATION;  Surgeon: [name] T. Gershon Crane, MD;  Location: [facility];  Service: Ophthalmology;  Laterality: Left;  . Appendectomy    . Laparotomy N/A 09/27/2014    Procedure: Exploratory Laparotomy, Biopsy of Perforated Gastric Ulcer, Closure with omental patch;  Surgeon: [name], MD;  Location: [facility];  Service: General;  Laterality: N/A;  . Embolectomy Left 09/27/2014    Procedure: Left Brachial, Radial,Ulnar Embolectomy with patch angioplasty left brachial, radial and ulnar artery;  Surgeon: [name], MD;  Location: [facility];  Service: Vascular;  Laterality: Left;  . Embolectomy Left 09/27/2014    Procedure: Left Radial, Brachial, and Ulnar Thrombectomy; Left Brachial to Radial Bypass Graft using Greater Saphenous vein graft from Left Thigh; Left Saphenous Vein Harvest; Intraoperative Arteriogram; Intra-arterial administration of TPA;  Surgeon: [name], MD;  Location: [facility] OR;  Service: Vascular;  Laterality: Left;  . Tracheostomy N/A december 2015    There were no vitals filed for this visit.  Visit Diagnosis:  Ataxia  Weakness of both legs  Proximal muscle weakness  Lack of coordination  Impaired  functional mobility and activity tolerance  Abnormality of gait  Poor posture      Subjective Assessment - 03/04/15 1311    Subjective Patient reports that she is continuing to do well, states that her contracted hand and her feet are still bothering her.    Pertinent History Patient reports that she became very ill in December of last year with a major blood clot in her arm, for which she required two surgeries. Patient states she remained ill for about 3 months; she also states that before she got sick at the end of the year, she was able to get up and walk, didn't need any help for transfers either. Patient reports that her MDs suspect that she may have MS but that she doesn't agree with them.    Pain Score 7   6/10 pain in feet, 7/10 pain in hand             [facility] PT Assessment - 03/04/15 0001    Observation/Other Assessments   Focus on Therapeutic Outcomes (FOTO)  55% limited    AROM   Right Knee Extension --  DNT due to being WNL last assessment    Right Knee Flexion --  DNT due to being WNL last assessment    Left Knee Extension --  DNT due to being WNL last assessment    Left Knee Flexion --  DNT due to being WNL last assessment    Right Ankle Dorsiflexion -9   Left Ankle Dorsiflexion 0   Strength   Right Hip Flexion 3+/5   Right Hip Extension 2-/5   Right Hip ABduction 2+/5   Left Hip Flexion 4-/5   Left Hip Extension 2-/5   Left Hip ABduction 2+/5   Right Knee Flexion 4/5   Right Knee Extension 4/5   Left Knee Flexion 4/5   Left Knee Extension 4+/5   Right Ankle Dorsiflexion 3+/5   Left Ankle Dorsiflexion 4-/5   Bed Mobility   Rolling Right 5: Supervision   Rolling Left 5: Supervision   Supine to Sit 5: Supervision   Sitting - Scoot to Edge of Bed 5: Supervision   Sit to Supine 5: Supervision   Scooting to [facility] 5: Supervision   Transfers   Sit to Stand 4: Min assist   Sit to Stand Details (indicate cue type and reason) B HHA   Stand to Sit 4: Min Adult nurse Transfers 4: Min Arts development officer Details (indicate cue type and reason) B HHA    Ambulation/Gait   Ambulation/Gait Yes   Ambulation/Gait Assistance 4: Min guard   Ambulation Distance (Feet) --  approx 145ft   Assistive device Standard walker   Gait Comments Narrow base of support, flexed trunk, occasional LOB, reduced B dorsiflexion, improving gait tolerance  PT Education - 03/04/15 1352    Education provided Yes   Education Details progress with skilled PT services, plan of care moving forward    Person(s) Educated Patient   Methods Explanation   Comprehension Verbalized understanding          PT Short Term Goals - 03/04/15 1337    PT SHORT TERM GOAL #1   Title Patient will demonstrate a grade of at least 3+/5 in proximal musculature and core in order to promote functional stability and safe mobility skills    Time 6   Period Weeks   Status On-going   PT SHORT TERM GOAL #2   Title Patient will be able to participate in standing tasks with Min(A) from therapist and weights on limbs to reduce ataxia symptoms for at least 7 minutes at a time    Baseline 5/26- improved standing tolerance but not quite up to 7 minutes yet    Time 6   Period Weeks   Status On-going   PT SHORT TERM GOAL #3   Title Patient to experience no more than 4/10 at worst on a daily basis at rest and during mobility    Time 6   Period Weeks   Status On-going   PT SHORT TERM GOAL #4   Title Patient will demonstrate the ability to ambulate at least 33ft x6 in body weight support system with weighted extremities and minimal fatigue, good gait mechanics throughout    Baseline 5/26- walking about 125-115ft with walker, but does continue to display gait impairments    Time 6   Period Weeks   Status Achieved   PT SHORT TERM GOAL #5   Title Patient will be able to perform functional transfers, including sit<->stand and stand<->pivot  from wheelchair to bed with close supervision and no loss of balance    Baseline 5/26- patient repots she is sometimes able to transfer herself safely at home with just supervision    Time 6   Period Weeks   Status On-going           PT Long Term Goals - 03/04/15 1341    PT LONG TERM GOAL #1   Title Patient will be independent in safely performed advanced HEP with assistance from caregivers as needed, updated PRN    Time 12   Period Weeks   Status On-going   PT LONG TERM GOAL #2   Title Patient will demonstrate at least 4/5 to 4+/5 muscle strength in all proximal muscles, bilateral lower extremities, and core    Time 12   Period Weeks   Status On-going   PT LONG TERM GOAL #3   Title Patient will demonstrate bilateral ankle DF of at least 15 degrees in order to improve overall gait mechanics and functional mobility skills    Time 12   Period Weeks   Status On-going   PT LONG TERM GOAL #4   Title Patient will demonstrate the ability to ambulate at least 156ft with LRAD, no more than distant supervision, no loss of balance, and minimal fatigue throughout    Time 12   Period Weeks   Status On-going   PT LONG TERM GOAL #5   Title Patient will be independent in performing functional transfers, including all bed mobility skills and sit<->stand and stand pivot transfers with Mod(I) and LRAD    Time 12   Period Weeks   Status On-going   PT LONG TERM GOAL #6   Title Patient will demonstrate the  ability to stand at least 15 minutes before fatiguing and with good posture and minimal loss of balance    Time 12   Period Weeks   Status On-going   PT LONG TERM GOAL #7   Title Patient will regularly perform weight bearing activities at home with assistance from caregivers as needed in order to protect skin and reduce detrimental effects of being sedentary    Time 12   Period Weeks   Status Achieved               Plan - April 03, 2015 1353    Clinical Impression Statement  Re-assessment performed today. Patient continues to demonstrate bilateral lower extremity/proximal muscle/core weakness, postural and gait impairments, reduced functional activity tolerance, impairments in functional mobility and transfer skills, reduced balance and balance reaction skills, reduced ankle dorsiflexion ROM,and reduced safety during functional mobility tasks, as well as continued need for assistance from caregivers at home. The patient however, does demonstrate improved quality of movement, improved gait distances, and improving functional activity tolearnce; she states taht she is now able to perform weight bearing tasks with her caregivers much more freqeuntly at home and says that she really is beginning to feel better and feel like she is movnig better overal. Patient will benefit from approximately 8 more weeks of skilled PT services in order to address these impairments and assist her in reaching an optimal level of function.    Pt will benefit from skilled therapeutic intervention in order to improve on the following deficits Abnormal gait;Decreased coordination;Decreased range of motion;Difficulty walking;Impaired flexibility;Improper body mechanics;Decreased endurance;Decreased safety awareness;Impaired sensation;Postural dysfunction;Decreased activity tolerance;Decreased balance;Decreased knowledge of use of DME;Pain;Decreased mobility;Decreased strength   Rehab Potential Good   Clinical Impairments Affecting Rehab Potential chronic ataxia symptoms however patient has reportedly been able to return to walking after other bouts of illness    PT Frequency 2x / week   PT Duration 8 weeks   PT Treatment/Interventions ADLs/Self Care Home Management;Gait training;Neuromuscular re-education;Visual/perceptual remediation/compensation;Stair training;Biofeedback;Functional mobility training;Patient/family education;Passive range of motion;Cryotherapy;Therapeutic activities;Wheelchair mobility  training;Electrical Stimulation;Therapeutic exercise;Manual techniques;Energy conservation;DME Instruction;Balance training   PT Next Visit Plan Exercises for proximal musculature mat table; deep pressure massage to assist in relieving ankle tightness; functional standing tasks with therapist facilitation; gait training with standard walker; continue to address hand contracture UNTIL/IF OT services start   PT Home Exercise Plan bridges, SLRs, ankle PF/DF, sit to stand    Consulted and Agree with Plan of Care Patient          G-Codes - 2015/04/03 1358    Functional Assessment Tool Used FOTO and skilled assessment based on functional strength, functional mobility skills, gait abnormalities, postural impairment, functional activity tolerance    Functional Limitation Mobility: Walking and moving around   Mobility: Walking and Moving Around Current Status (B1517) At least 40 percent but less than 60 percent impaired, limited or restricted   Mobility: Walking and Moving Around Goal Status [phone]) At least 20 percent but less than 40 percent impaired, limited or restricted      Problem List Patient Active Problem List   Diagnosis Date Noted  . Insomnia 02/02/2015  . MRSA pneumonia 10/11/2014  . Abdominal abscess   . Candidemia   . Screen for STD (sexually transmitted disease)   . 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 Perforated gastric ulcer 09/27/2014  . Prediabetes 07/23/2014  . Unspecified hereditary and idiopathic peripheral neuropathy 04/15/2014  . CVA (cerebral infarction) 04/02/2014  . Hypertriglyceridemia 12/11/2013  . Stiffness of joint,  not elsewhere classified, ankle and foot 11/12/2013  . Lack of coordination 11/03/2013  . Pernicious anemia 09/12/2013  . Elevated transaminase level 09/12/2013  . Ataxia 09/09/2013  . Difficulty in walking(719.7) 09/09/2013  . Weakness of both legs 09/09/2013  . Generalized weakness 02/11/2013  . Tobacco abuse 01/14/2013  . Pedal edema  01/13/2013  . Chronic pain syndrome 12/30/2012  . Reactive airway disease 12/30/2012    Deniece Ree PT, DPT [facility] [address] [place], [place], [address] Phone: [phone]   Fax:  [phone]

## 2015-03-06 NOTE — Progress Notes (Signed)
Reviewed & agree with plan  

## 2015-03-09 ENCOUNTER — Ambulatory Visit (HOSPITAL_COMMUNITY): Payer: Self-pay | Admitting: Speech Pathology

## 2015-03-09 ENCOUNTER — Ambulatory Visit (HOSPITAL_COMMUNITY): Payer: BLUE CROSS/BLUE SHIELD | Admitting: Physical Therapy

## 2015-03-10 ENCOUNTER — Ambulatory Visit (HOSPITAL_COMMUNITY): Payer: BLUE CROSS/BLUE SHIELD | Attending: Family Medicine | Admitting: Physical Therapy

## 2015-03-10 DIAGNOSIS — R269 Unspecified abnormalities of gait and mobility: Secondary | ICD-10-CM | POA: Diagnosis not present

## 2015-03-10 DIAGNOSIS — Z7409 Other reduced mobility: Secondary | ICD-10-CM | POA: Diagnosis not present

## 2015-03-10 DIAGNOSIS — R29898 Other symptoms and signs involving the musculoskeletal system: Secondary | ICD-10-CM | POA: Diagnosis not present

## 2015-03-10 DIAGNOSIS — R293 Abnormal posture: Secondary | ICD-10-CM

## 2015-03-10 DIAGNOSIS — R270 Ataxia, unspecified: Secondary | ICD-10-CM

## 2015-03-10 DIAGNOSIS — M6281 Muscle weakness (generalized): Secondary | ICD-10-CM

## 2015-03-10 DIAGNOSIS — M6289 Other specified disorders of muscle: Secondary | ICD-10-CM | POA: Insufficient documentation

## 2015-03-10 DIAGNOSIS — R279 Unspecified lack of coordination: Secondary | ICD-10-CM | POA: Diagnosis not present

## 2015-03-10 NOTE — Therapy (Signed)
[place], [place], [address] Phone: [phone]   Fax:  [phone]  Physical Therapy Treatment  Patient Details  Name: [name] MRN: [id number] Date of Birth: 1966-05-17 Referring Provider:  Kathyrn Drown, MD  Encounter Date: 03/10/2015      PT End of Session - 03/10/15 1624    Visit Number 7   Number of Visits 24   Date for PT Re-Evaluation 04/01/15   Authorization Type Medicare    Authorization Time Period 02/03/15 to 04/05/15; G-code done 6th visit    Authorization - Visit Number 7   Authorization - Number of Visits 10   PT Start Time 1520   PT Stop Time 1602   PT Time Calculation (min) 42 min      Past Medical History  Diagnosis Date  . Anxiety   . Depression   . COPD (chronic obstructive pulmonary disease)   . MS (multiple sclerosis)   . Fibromyalgia   . Impaired fasting glucose   . 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 Eating disorder   . Chronic low back pain   . PONV (postoperative nausea and vomiting)   . Chronic bronchitis     "yearly" (02/11/2013)  . Borderline diabetes   . GERD (gastroesophageal reflux disease)   . Migraine     "I use to" (02/11/2013)  . Arthritis     "hands & feet" (02/11/2013)  . Peripheral neuropathy     [name] 02/11/2013  . B12 deficiency anemia     [name] 02/11/2013  . Chronic pain syndrome     [name] 02/11/2013  . UTI (lower urinary tract infection) 02/11/2013    [name] 02/11/2013  . Chronic edema     BLE/notes 02/11/2013  . Ataxia   . Peripheral neuropathy   . Muscle weakness of lower extremity     bilateral  . Hypertension     Past Surgical History  Procedure Laterality Date  . Abdominal hysterectomy  ~ 1987; ~ 2004    "[place]; [place]" (02/11/2013)  . Cesarean section  7253; 1984; 1986  . Anterior cervical decomp/discectomy fusion  2009; 2011  . Tonsillectomy  1990's?  . Cataract extraction w/phaco  06/20/2011   Procedure: CATARACT EXTRACTION PHACO AND INTRAOCULAR LENS PLACEMENT (IOC);  Surgeon: [name] T. Gershon Crane;  Location: [facility];  Service: Ophthalmology;  Laterality: Left;  CDE 1.81  . Cataract extraction w/phaco  07/04/2011    Procedure: CATARACT EXTRACTION PHACO AND INTRAOCULAR LENS PLACEMENT (IOC);  Surgeon: [name] T. Gershon Crane;  Location: [facility];  Service: Ophthalmology;  Laterality: Right;  CDE: 1.76  . Yag laser application Right 6/64/4034    Procedure: YAG LASER APPLICATION;  Surgeon: [name] T. Gershon Crane, MD;  Location: [facility];  Service: Ophthalmology;  Laterality: Right;  . Yag laser application Left 7/42/5956    Procedure: YAG LASER APPLICATION;  Surgeon: [name] T. Gershon Crane, MD;  Location: [facility];  Service: Ophthalmology;  Laterality: Left;  . Appendectomy    . Laparotomy N/A 09/27/2014    Procedure: Exploratory Laparotomy, Biopsy of Perforated Gastric Ulcer, Closure with omental patch;  Surgeon: [name], MD;  Location: [facility];  Service: General;  Laterality: N/A;  . Embolectomy Left 09/27/2014    Procedure: Left Brachial, Radial,Ulnar Embolectomy with patch angioplasty left brachial, radial and ulnar artery;  Surgeon: [name], MD;  Location: [facility];  Service: Vascular;  Laterality: Left;  . Embolectomy Left 09/27/2014    Procedure: Left Radial,  Brachial, and Ulnar Thrombectomy; Left Brachial to Radial Bypass Graft using Greater Saphenous vein graft from Left Thigh; Left Saphenous Vein Harvest; Intraoperative Arteriogram; Intra-arterial administration of TPA;  Surgeon: [name], MD;  Location: [facility];  Service: Vascular;  Laterality: Left;  . Tracheostomy N/A december 2015    There were no vitals filed for this visit.  Visit Diagnosis:  Ataxia  Weakness of both legs  Proximal muscle weakness  Lack of coordination  Impaired functional mobility and activity tolerance  Abnormality of gait  Poor posture      Subjective Assessment - 03/10/15 1616    Subjective Patient states  she is doing very well, excited that she was able to go to [employer] this weekend but says she was tired afterwards. Very excited about starting speech and OT here soon, as she reports that referrals finally went through.    Pertinent History Patient reports that she became very ill in December of last year with a major blood clot in her arm, for which she required two surgeries. Patient states she remained ill for about 3 months; she also states that before she got sick at the end of the year, she was able to get up and walk, didn't need any help for transfers either. Patient reports that her MDs suspect that she may have MS but that she doesn't agree with them.    Currently in Pain? Yes   Pain Score 6    Pain Location Other (Comment)  contracted hand                          [facility] Adult PT Treatment/Exercise - 03/10/15 0001    Knee/Hip Exercises: Stretches   Passive Hamstring Stretch 3 reps;30 seconds   Passive Hamstring Stretch Limitations supine    Piriformis Stretch 2 reps;30 seconds   Piriformis Stretch Limitations supine    Gastroc Stretch 30 seconds;3 reps   Gastroc Stretch Limitations supine    Knee/Hip Exercises: Standing   Other Standing Knee Exercises Standing marches 1x10; heel raises 1x10   Knee/Hip Exercises: Seated   Other Seated Knee Exercises Cone rotation at edge of mat table with focus on pelvic weight shifting, cross midline reaches, core strength due to feet being not supported    Knee/Hip Exercises: Supine   Bridges Both;2 sets;10 reps   Bridges Limitations manual facilitation for full range bridge    Straight Leg Raises Both;2 sets;10 reps   Straight Leg Raises Limitations cues for form    Knee/Hip Exercises: Sidelying   Clams 2x10 red TB, manual cues for form    Other Sidelying Knee Exercises Resisted rolling 2x5 each way                 PT Education - 03/10/15 1623    Education provided Yes   Education Details education on energy  conservation at while being active at home    Person(s) Educated Patient   Methods Explanation   Comprehension Verbalized understanding          PT Short Term Goals - 03/04/15 1337    PT SHORT TERM GOAL #1   Title Patient will demonstrate a grade of at least 3+/5 in proximal musculature and core in order to promote functional stability and safe mobility skills    Time 6   Period Weeks   Status On-going   PT SHORT TERM GOAL #2   Title Patient will be able to participate in standing tasks  with Min(A) from therapist and weights on limbs to reduce ataxia symptoms for at least 7 minutes at a time    Baseline 5/26- improved standing tolerance but not quite up to 7 minutes yet    Time 6   Period Weeks   Status On-going   PT SHORT TERM GOAL #3   Title Patient to experience no more than 4/10 at worst on a daily basis at rest and during mobility    Time 6   Period Weeks   Status On-going   PT SHORT TERM GOAL #4   Title Patient will demonstrate the ability to ambulate at least 28ft x6 in body weight support system with weighted extremities and minimal fatigue, good gait mechanics throughout    Baseline 5/26- walking about 125-125ft with walker, but does continue to display gait impairments    Time 6   Period Weeks   Status Achieved   PT SHORT TERM GOAL #5   Title Patient will be able to perform functional transfers, including sit<->stand and stand<->pivot from wheelchair to bed with close supervision and no loss of balance    Baseline 5/26- patient repots she is sometimes able to transfer herself safely at home with just supervision    Time 6   Period Weeks   Status On-going           PT Long Term Goals - 03/04/15 1341    PT LONG TERM GOAL #1   Title Patient will be independent in safely performed advanced HEP with assistance from caregivers as needed, updated PRN    Time 12   Period Weeks   Status On-going   PT LONG TERM GOAL #2   Title Patient will demonstrate at least 4/5  to 4+/5 muscle strength in all proximal muscles, bilateral lower extremities, and core    Time 12   Period Weeks   Status On-going   PT LONG TERM GOAL #3   Title Patient will demonstrate bilateral ankle DF of at least 15 degrees in order to improve overall gait mechanics and functional mobility skills    Time 12   Period Weeks   Status On-going   PT LONG TERM GOAL #4   Title Patient will demonstrate the ability to ambulate at least 18ft with LRAD, no more than distant supervision, no loss of balance, and minimal fatigue throughout    Time 12   Period Weeks   Status On-going   PT LONG TERM GOAL #5   Title Patient will be independent in performing functional transfers, including all bed mobility skills and sit<->stand and stand pivot transfers with Mod(I) and LRAD    Time 12   Period Weeks   Status On-going   PT LONG TERM GOAL #6   Title Patient will demonstrate the ability to stand at least 15 minutes before fatiguing and with good posture and minimal loss of balance    Time 12   Period Weeks   Status On-going   PT LONG TERM GOAL #7   Title Patient will regularly perform weight bearing activities at home with assistance from caregivers as needed in order to protect skin and reduce detrimental effects of being sedentary    Time 12   Period Weeks   Status Achieved               Plan - 03/10/15 1626    Clinical Impression Statement Focus on flexibilty and proximal muscle/core strengthening today with super-sets of table exercises targeting functional musculature. Patient did  require manual cues and facilitation for proper performance of exercises today, possibly due to combination of core/proximal muscle weakness and ataxia. Good form with cone rotation task with feet unsupported today. Patient very excited that she has been able to do much more at home however PT did provide education today regarding activity tolerance and balance between staying active without reaching the  point of exhaustion; patietnt gave verbal agreement to this education.    Pt will benefit from skilled therapeutic intervention in order to improve on the following deficits Abnormal gait;Decreased coordination;Decreased range of motion;Difficulty walking;Impaired flexibility;Improper body mechanics;Decreased endurance;Decreased safety awareness;Impaired sensation;Postural dysfunction;Decreased activity tolerance;Decreased balance;Decreased knowledge of use of DME;Pain;Decreased mobility;Decreased strength   Rehab Potential Good   Clinical Impairments Affecting Rehab Potential chronic ataxia symptoms however patient has reportedly been able to return to walking after other bouts of illness    PT Frequency 2x / week   PT Duration 8 weeks   PT Treatment/Interventions ADLs/Self Care Home Management;Gait training;Neuromuscular re-education;Visual/perceptual remediation/compensation;Stair training;Biofeedback;Functional mobility training;Patient/family education;Passive range of motion;Cryotherapy;Therapeutic activities;Wheelchair mobility training;Electrical Stimulation;Therapeutic exercise;Manual techniques;Energy conservation;DME Instruction;Balance training   PT Next Visit Plan Exercises for proximal musculature mat table; deep pressure massage to assist in relieving ankle tightness; functional standing tasks with therapist facilitation; gait training with standard walker; sitting and standing balance tasks    PT Home Exercise Plan bridges, SLRs, ankle PF/DF, sit to stand    Consulted and Agree with Plan of Care Patient        Problem List Patient Active Problem List   Diagnosis Date Noted  . Insomnia 02/02/2015  . MRSA pneumonia 10/11/2014  . Abdominal abscess   . Candidemia   . Screen for STD (sexually transmitted disease)   . 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 Perforated gastric ulcer 09/27/2014  . Prediabetes 07/23/2014  . Unspecified hereditary and idiopathic peripheral neuropathy  04/15/2014  . CVA (cerebral infarction) 04/02/2014  . Hypertriglyceridemia 12/11/2013  . Stiffness of joint, not elsewhere classified, ankle and foot 11/12/2013  . Lack of coordination 11/03/2013  . Pernicious anemia 09/12/2013  . Elevated transaminase level 09/12/2013  . Ataxia 09/09/2013  . Difficulty in walking(719.7) 09/09/2013  . Weakness of both legs 09/09/2013  . Generalized weakness 02/11/2013  . Tobacco abuse 01/14/2013  . Pedal edema 01/13/2013  . Chronic pain syndrome 12/30/2012  . Reactive airway disease 12/30/2012    Deniece Ree PT, DPT [place] [address] [place], [place], [address] Phone: [phone]   Fax:  [phone]

## 2015-03-11 ENCOUNTER — Ambulatory Visit (HOSPITAL_COMMUNITY): Payer: BLUE CROSS/BLUE SHIELD

## 2015-03-11 ENCOUNTER — Ambulatory Visit (HOSPITAL_COMMUNITY): Payer: BLUE CROSS/BLUE SHIELD | Admitting: Speech Pathology

## 2015-03-11 ENCOUNTER — Ambulatory Visit (HOSPITAL_COMMUNITY): Payer: BLUE CROSS/BLUE SHIELD | Admitting: Physical Therapy

## 2015-03-11 ENCOUNTER — Encounter (HOSPITAL_COMMUNITY): Payer: Self-pay

## 2015-03-11 DIAGNOSIS — R293 Abnormal posture: Secondary | ICD-10-CM | POA: Diagnosis not present

## 2015-03-11 DIAGNOSIS — R471 Dysarthria and anarthria: Secondary | ICD-10-CM

## 2015-03-11 DIAGNOSIS — R29898 Other symptoms and signs involving the musculoskeletal system: Secondary | ICD-10-CM | POA: Diagnosis not present

## 2015-03-11 DIAGNOSIS — M79642 Pain in left hand: Secondary | ICD-10-CM

## 2015-03-11 DIAGNOSIS — R279 Unspecified lack of coordination: Secondary | ICD-10-CM

## 2015-03-11 DIAGNOSIS — G709 Myoneural disorder, unspecified: Secondary | ICD-10-CM

## 2015-03-11 DIAGNOSIS — M25642 Stiffness of left hand, not elsewhere classified: Secondary | ICD-10-CM

## 2015-03-11 DIAGNOSIS — R270 Ataxia, unspecified: Secondary | ICD-10-CM

## 2015-03-11 DIAGNOSIS — R490 Dysphonia: Secondary | ICD-10-CM

## 2015-03-11 DIAGNOSIS — M6281 Muscle weakness (generalized): Secondary | ICD-10-CM

## 2015-03-11 DIAGNOSIS — R269 Unspecified abnormalities of gait and mobility: Secondary | ICD-10-CM | POA: Diagnosis not present

## 2015-03-11 DIAGNOSIS — Z7409 Other reduced mobility: Secondary | ICD-10-CM

## 2015-03-11 DIAGNOSIS — M6289 Other specified disorders of muscle: Secondary | ICD-10-CM | POA: Diagnosis not present

## 2015-03-11 NOTE — Therapy (Signed)
[place], [place], [address] Phone: [phone]   Fax:  [phone]  Occupational Therapy Evaluation  Patient Details  Name: [name] MRN: [id number] Date of Birth: [birth year] Referring Provider:  Kathyrn Drown, MD  Encounter Date: 03/11/2015      OT End of Session - 03/11/15 1413    Visit Number 1   Number of Visits 16   Date for OT Re-Evaluation 05/10/15  mini reassessment: 04/08/15   Authorization Type Medicare A   Authorization Time Period before 10th visit   Authorization - Visit Number 1   Authorization - Number of Visits 10   OT Start Time 1305   OT Stop Time 1345   OT Time Calculation (min) 40 min   Activity Tolerance Patient tolerated treatment well   Behavior During Therapy [facility] for tasks assessed/performed      Past Medical History  Diagnosis Date  . Anxiety   . Depression   . COPD (chronic obstructive pulmonary disease)   . MS (multiple sclerosis)   . Fibromyalgia   . Impaired fasting glucose   . 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 Eating disorder   . Chronic low back pain   . PONV (postoperative nausea and vomiting)   . Chronic bronchitis     "yearly" (02/11/2013)  . Borderline diabetes   . GERD (gastroesophageal reflux disease)   . Migraine     "I use to" (02/11/2013)  . Arthritis     "hands & feet" (02/11/2013)  . Peripheral neuropathy     [name] 02/11/2013  . B12 deficiency anemia     [name] 02/11/2013  . Chronic pain syndrome     [name] 02/11/2013  . UTI (lower urinary tract infection) 02/11/2013    [name] 02/11/2013  . Chronic edema     BLE/notes 02/11/2013  . Ataxia   . Peripheral neuropathy   . Muscle weakness of lower extremity     bilateral  . Hypertension     Past Surgical History  Procedure Laterality Date  . Abdominal hysterectomy  ~ 1987; ~ 2004    "woodward; ferguson" (02/11/2013)  . Cesarean section  6962; 1984; 1986  . Anterior  cervical decomp/discectomy fusion  2009; 2011  . Tonsillectomy  1990's?  . Cataract extraction w/phaco  06/20/2011    Procedure: CATARACT EXTRACTION PHACO AND INTRAOCULAR LENS PLACEMENT (IOC);  Surgeon: [name] T. Gershon Crane;  Location: [facility];  Service: Ophthalmology;  Laterality: Left;  CDE 1.81  . Cataract extraction w/phaco  07/04/2011    Procedure: CATARACT EXTRACTION PHACO AND INTRAOCULAR LENS PLACEMENT (IOC);  Surgeon: [name] T. Gershon Crane;  Location: [facility];  Service: Ophthalmology;  Laterality: Right;  CDE: 1.76  . Yag laser application Right 9/52/8413    Procedure: YAG LASER APPLICATION;  Surgeon: [name] T. Gershon Crane, MD;  Location: [facility];  Service: Ophthalmology;  Laterality: Right;  . Yag laser application Left 2/44/0102    Procedure: YAG LASER APPLICATION;  Surgeon: [name] T. Gershon Crane, MD;  Location: [facility];  Service: Ophthalmology;  Laterality: Left;  . Appendectomy    . Laparotomy N/A 09/27/2014    Procedure: Exploratory Laparotomy, Biopsy of Perforated Gastric Ulcer, Closure with omental patch;  Surgeon: [name], MD;  Location: [facility];  Service: General;  Laterality: N/A;  . Embolectomy Left 09/27/2014    Procedure: Left Brachial, Radial,Ulnar Embolectomy with patch angioplasty left brachial, radial and ulnar artery;  Surgeon: [name], MD;  Location[facility]  OR;  Service: Vascular;  Laterality: Left;  . Embolectomy Left 09/27/2014    Procedure: Left Radial, Brachial, and Ulnar Thrombectomy; Left Brachial to Radial Bypass Graft using Greater Saphenous vein graft from Left Thigh; Left Saphenous Vein Harvest; Intraoperative Arteriogram; Intra-arterial administration of TPA;  Surgeon: [name], MD;  Location: [facility] OR;  Service: Vascular;  Laterality: Left;  . Tracheostomy N/A december 2015    There were no vitals filed for this visit.  Visit Diagnosis:  Ataxia - Plan: Ot plan of care cert/re-cert  Lack of coordination - Plan: Ot plan of care cert/re-cert  Hand muscle weakness - Plan:  Ot plan of care cert/re-cert  Hand joint pain, left - Plan: Ot plan of care cert/re-cert  Neuromuscular disease - Plan: Ot plan of care cert/re-cert  Stiffness of hand joint, left - Plan: Ot plan of care cert/re-cert      Subjective Assessment - 03/11/15 1401    Subjective  S: I woke up and my hand was like this.    Pertinent History Patient is a 49 y/o female who reports that in December she became ill and developed a blood clot in her left arm requiring her to have 2 surgeries. patient reports that after waking up her 3rd, 4th, and 5th metecarpal were in a flexed claw like state. Patient states that her Doctor's do not know what has caused her to physically decline. patient was independent with all daily tasks prior to becoming ill. Now patient requires 24 hour care for all daily tasks. Dr. [name] has referred patient to occupational therapy for evaluation and treatment.    Patient Stated Goals To increase function in her left hand.    Currently in Pain? Yes   Pain Score 5    Pain Location Hand   Pain Orientation Left   Pain Descriptors / Indicators Aching;Dull   Pain Onset More than a month ago   Multiple Pain Sites Yes   Pain Score 5   Pain Location Foot   Pain Orientation Right;Left   Pain Descriptors / Indicators Aching   Pain Onset More than a month ago   Pain Score 5   Pain Location Leg   Pain Orientation Left;Right   Pain Descriptors / Indicators Aching           [facility] OT Assessment - 03/11/15 1314    Assessment   Diagnosis left hand spaticity   Onset Date --  December 2015   Prior Therapy Home health OT for approx. 1 month   Precautions   Precautions Fall   Restrictions   Weight Bearing Restrictions No   Balance Screen   Has the patient fallen in the past 6 months No   Has the patient had a decrease in activity level because of a fear of falling?  No   Is the patient reluctant to leave their home because of a fear of falling?  No   Home  Environment    Family/patient expects to be discharged to: Private residence   Living Arrangements Other(Comment)  significant other   Available Help at Discharge Available 24 hours/day   Home Equipment Wheelchair - manual;Shower seat;Walker - 4 wheels;Cane - single point;Grab bars - tub/shower;Grab bars - toilet   Additional Comments Patient has 24 hour care for bathing and dressing.    Prior Function   Level of Independence Independent with basic ADLs;Independent with gait   Vocation On disability   ADL   ADL comments Patient receives mod-max assist for bathing  and dressing tasks. Patient is able to complete self feeding with set-up when needed. Pt is primarily wheelchair bound spending the majority of the time in wheelchair or recliner.    Mobility   Mobility Status Needs assist   Mobility Status Comments Pt uses a wheelchair for mobility.    Written Expression   Dominant Hand Right   Vision - History   Baseline Vision No visual deficits   Cognition   Overall Cognitive Status Within Functional Limits for tasks assessed   Observation/Other Assessments   Observations 2 healed incisions on volar side of left forearm approx. 4 inches long.   Skin Integrity Left 4th and 5th metatarsal fingers present with necrotic fingernails. Slight breakdown with webbing of fingers.   Sensation   Light Touch Not tested   Stereognosis Not tested   Hot/Cold Not tested   Proprioception Not tested   Coordination   9 Hole Peg Test Right;Left   Right 9 Hole Peg Test 1'17"   Left 9 Hole Peg Test 5'26"   ROM / Strength   AROM / PROM / Strength AROM;PROM;Strength   AROM   Overall AROM Comments Assessed Seated. shoulder and elbow AROM WFL.   AROM Assessment Site Forearm;Wrist;Finger;Thumb   Right/Left Forearm Right;Left   Right/Left Wrist Left   Right Wrist Extension 54 Degrees   Right Wrist Flexion 50 Degrees   Right Wrist Radial Deviation 24 Degrees   Right Wrist Ulnar Deviation 24 Degrees   Left Wrist Extension  48 Degrees   Left Wrist Flexion 30 Degrees   Left Wrist Radial Deviation 12 Degrees   Left Wrist Ulnar Deviation 18 Degrees   Right/Left Finger Left   Right Composite Finger Extension --  100%   Right Composite Finger Flexion --  100%   Left Composite Finger Extension 50%   Left Composite Finger Flexion --  100%   Right/Left Thumb Left   Left Thumb Opposition Digit 5   PROM   Overall PROM Comments Pt able to tolerate minimal PROM to Left hand digits.    Strength   Overall Strength Comments Assessed seated.   Strength Assessment Site Hand;Shoulder;Elbow   Right/Left Shoulder Left;Right   Right Shoulder Flexion 4+/5   Right Shoulder ABduction 4+/5   Right Shoulder Horizontal ABduction 4+/5   Right Shoulder Horizontal ADduction 4+/5   Left Shoulder Flexion 4+/5   Left Shoulder ABduction 4+/5   Left Shoulder Horizontal ABduction --  4+/5   Left Shoulder Horizontal ADduction 4+/5   Right/Left Elbow Right;Left   Right Elbow Flexion 4+/5   Right Elbow Extension 4+/5   Left Elbow Flexion 4+/5   Left Elbow Extension 4+/5   Right/Left hand Right;Left   Right Hand Grip (lbs) 39   Right Hand Lateral Pinch 12 lbs   Right Hand 3 Point Pinch 10 lbs   Left Hand Grip (lbs) 15   Left Hand Lateral Pinch 6 lbs   Left Hand 3 Point Pinch 3 lbs   Left Hand AROM   L Long  MCP 0-90 0 Degrees   L Long PIP 0-100 50 Degrees   L Long DIP 0-70 8 Degrees   L Ring  MCP 0-90 0 Degrees   L Ring PIP 0-100 64 Degrees   L Ring DIP 0-70 16 Degrees   L Little  MCP 0-90 --  +26   L Little PIP 0-100 52 Degrees   L Little DIP 0-70 42 Degrees  OT Short Term Goals - Mar 20, 2015 1748    OT SHORT TERM GOAL #1   Title Patient will be educated and independent with HEP.   Time 4   Period Weeks   Status New   OT SHORT TERM GOAL #2   Title Patient will increase AROM of left hand by 5 degrees to increase ability to use left hand during daily tasks.    Time 4    Period Weeks   Status New   OT SHORT TERM GOAL #3   Title Therapist will fabricate finger extension splint to defer the chance of progressing spaticity.    Time 4   Period Weeks   Status New   OT SHORT TERM GOAL #4   Title Patient will increase coordination in Bil hands by decreasing completion time of  9 hole peg test by 10 seconds.    Time 4   Period Weeks   Status New   OT SHORT TERM GOAL #5   Title Patient will increase grip strength by 5# and pinch strength by 3#  of left hand to increase ability to use left hand as an active assist during daily tasks.    Time 4   Period Weeks   Status New           OT Long Term Goals - 03/20/2015 1754    OT LONG TERM GOAL #1   Title Patient will return to highest level of independence with all daily activities.    Time 8   Period Weeks   Status New   OT LONG TERM GOAL #2   Title Patient will increase AROM of left hand by 10 degrees.   Time 8   Period Weeks   Status New   OT LONG TERM GOAL #3   Title Patient will increase PROM of left hand to [facility] to decrease the progression of spaticity   Time 8   Period Weeks   Status New   OT LONG TERM GOAL #4   Title Patient will increase grip strength by 10# and pinch strength by 5# in LUE.   Time 8   Period Weeks   Status New   OT LONG TERM GOAL #5   Title Patient will increase Bil coordination by decreasing completion time of 9 hole peg test by 30 seconds.    Time 8   Period Weeks   Status New               Plan - 03/20/2015 1741    Clinical Impression Statement A: Patient is a 49 y/o female presenting with Bilateral hand weakness, ataxia, and spaticity of left hand causing increased difficulty completing daily tasks.   Pt will benefit from skilled therapeutic intervention in order to improve on the following deficits (Retired) Decreased coordination;Decreased range of motion;Impaired tone;Increased fascial restricitons;Decreased balance;Impaired UE functional use;Pain;Decreased  strength   Rehab Potential Excellent   OT Frequency 2x / week   OT Duration 4 weeks   OT Treatment/Interventions Self-care/ADL training;Therapeutic exercise;Patient/family education;Splinting;Manual Therapy;Neuromuscular education;Ultrasound;Therapeutic activities;DME and/or AE instruction;Parrafin;Cryotherapy;Electrical Stimulation;Passive range of motion;Moist Heat   Plan P: Fabricate finger extension splint. Treatment Plan: Passive stretching to left hand, grip and pinch strength for bilateral hands, functional coordination tasks, ADL re-training   Consulted and Agree with Plan of Care Patient          G-Codes - 03/20/15 1757    Functional Assessment Tool Used left grip strength (15#) vs. right grip strength (39#) - (61.6% immpaired)  Functional Limitation Carrying, moving and handling objects   Carrying, Moving and Handling Objects Current Status [phone]) At least 60 percent but less than 80 percent impaired, limited or restricted   Carrying, Moving and Handling Objects Goal Status (Q1194) At least 20 percent but less than 40 percent impaired, limited or restricted      Problem List Patient Active Problem List   Diagnosis Date Noted  . Insomnia 02/02/2015  . MRSA pneumonia 10/11/2014  . Abdominal abscess   . Candidemia   . Screen for STD (sexually transmitted disease)   . 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 Perforated gastric ulcer 09/27/2014  . Prediabetes 07/23/2014  . Unspecified hereditary and idiopathic peripheral neuropathy 04/15/2014  . CVA (cerebral infarction) 04/02/2014  . Hypertriglyceridemia 12/11/2013  . Stiffness of joint, not elsewhere classified, ankle and foot 11/12/2013  . Lack of coordination 11/03/2013  . Pernicious anemia 09/12/2013  . Elevated transaminase level 09/12/2013  . Ataxia 09/09/2013  . Difficulty in walking(719.7) 09/09/2013  . Weakness of both legs 09/09/2013  . Generalized weakness 02/11/2013  . Tobacco abuse 01/14/2013  . Pedal  edema 01/13/2013  . Chronic pain syndrome 12/30/2012  . Reactive airway disease 12/30/2012    Ailene Ravel, OTR/L,CBIS  [phone]  03/11/2015, 6:02 PM  [facility] [address] [place], [place], [address] Phone: [phone]   Fax:  [phone]

## 2015-03-11 NOTE — Therapy (Signed)
[place], [place], [address] Phone: [phone]   Fax:  [phone]  Physical Therapy Treatment  Patient Details  Name: [name] MRN: [id number] Date of Birth: Jun 22, 1966 Referring Provider:  Kathyrn Drown, MD  Encounter Date: 03/11/2015      PT End of Session - 03/11/15 1733    Visit Number 8   Number of Visits 24   Date for PT Re-Evaluation 04/01/15   Authorization Type Medicare    Authorization Time Period 02/03/15 to 04/05/15; G-code done 6th visit    Authorization - Visit Number 8   Authorization - Number of Visits 10   PT Start Time 0109   PT Stop Time 1433   PT Time Calculation (min) 45 min   Equipment Utilized During Treatment Gait belt   Activity Tolerance Patient tolerated treatment well   Behavior During Therapy [facility] for tasks assessed/performed      Past Medical History  Diagnosis Date  . Anxiety   . Depression   . COPD (chronic obstructive pulmonary disease)   . MS (multiple sclerosis)   . Fibromyalgia   . Impaired fasting glucose   . 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 Eating disorder   . Chronic low back pain   . PONV (postoperative nausea and vomiting)   . Chronic bronchitis     "yearly" (02/11/2013)  . Borderline diabetes   . GERD (gastroesophageal reflux disease)   . Migraine     "I use to" (02/11/2013)  . Arthritis     "hands & feet" (02/11/2013)  . Peripheral neuropathy     [name] 02/11/2013  . B12 deficiency anemia     [name] 02/11/2013  . Chronic pain syndrome     [name] 02/11/2013  . UTI (lower urinary tract infection) 02/11/2013    [name] 02/11/2013  . Chronic edema     BLE/notes 02/11/2013  . Ataxia   . Peripheral neuropathy   . Muscle weakness of lower extremity     bilateral  . Hypertension     Past Surgical History  Procedure Laterality Date  . Abdominal hysterectomy  ~ 1987; ~ 2004    "[place]; [place]" (02/11/2013)  . Cesarean  section  3235; 1984; 1986  . Anterior cervical decomp/discectomy fusion  2009; 2011  . Tonsillectomy  1990's?  . Cataract extraction w/phaco  06/20/2011    Procedure: CATARACT EXTRACTION PHACO AND INTRAOCULAR LENS PLACEMENT (IOC);  Surgeon: [name] T. Gershon Crane;  Location: [facility];  Service: Ophthalmology;  Laterality: Left;  CDE 1.81  . Cataract extraction w/phaco  07/04/2011    Procedure: CATARACT EXTRACTION PHACO AND INTRAOCULAR LENS PLACEMENT (IOC);  Surgeon: [name] T. Gershon Crane;  Location: [facility];  Service: Ophthalmology;  Laterality: Right;  CDE: 1.76  . Yag laser application Right 5/73/2202    Procedure: YAG LASER APPLICATION;  Surgeon: [name] T. Gershon Crane, MD;  Location: [facility];  Service: Ophthalmology;  Laterality: Right;  . Yag laser application Left 5/42/7062    Procedure: YAG LASER APPLICATION;  Surgeon: [name] T. Gershon Crane, MD;  Location: [facility];  Service: Ophthalmology;  Laterality: Left;  . Appendectomy    . Laparotomy N/A 09/27/2014    Procedure: Exploratory Laparotomy, Biopsy of Perforated Gastric Ulcer, Closure with omental patch;  Surgeon: [name], MD;  Location: [facility];  Service: General;  Laterality: N/A;  . Embolectomy Left 09/27/2014    Procedure: Left Brachial, Radial,Ulnar Embolectomy with patch angioplasty left brachial, radial and ulnar artery;  Surgeon: [name], MD;  Location: [facility];  Service: Vascular;  Laterality: Left;  . Embolectomy Left 09/27/2014    Procedure: Left Radial, Brachial, and Ulnar Thrombectomy; Left Brachial to Radial Bypass Graft using Greater Saphenous vein graft from Left Thigh; Left Saphenous Vein Harvest; Intraoperative Arteriogram; Intra-arterial administration of TPA;  Surgeon: [name], MD;  Location: [facility] OR;  Service: Vascular;  Laterality: Left;  . Tracheostomy N/A december 2015    There were no vitals filed for this visit.  Visit Diagnosis:  Ataxia  Neuromuscular disease  Weakness of both legs  Proximal muscle weakness  Impaired  functional mobility and activity tolerance  Abnormality of gait  Poor posture      Subjective Assessment - 03/11/15 1727    Subjective Patient reports that she is just tired today, but otherwise doing well. She also reports that she is starting with OT/speech today.    Pertinent History Patient reports that she became very ill in December of last year with a major blood clot in her arm, for which she required two surgeries. Patient states she remained ill for about 3 months; she also states that before she got sick at the end of the year, she was able to get up and walk, didn't need any help for transfers either. Patient reports that her MDs suspect that she may have MS but that she doesn't agree with them.    Currently in Pain? Yes   Pain Score 5    Pain Location Other (Comment)  hands and feet                          [facility] Adult PT Treatment/Exercise - 03/11/15 0001    Ambulation/Gait   Ambulation/Gait Yes   Ambulation/Gait Assistance 4: Min guard   Ambulation/Gait Assistance Details standard walker    Assistive device Standard walker   Gait Comments 2 laps around parallel bars, also worked on navigation around turns and through cones with walker; multiple posterior LOB today that required Min(A) to correct. Gait with weights on distal LEs and attached to gait belt.    Knee/Hip Exercises: Stretches   Passive Hamstring Stretch 2 reps;30 seconds   Passive Hamstring Stretch Limitations supine    Piriformis Stretch 2 reps;30 seconds   Piriformis Stretch Limitations supine    Gastroc Stretch 2 reps;30 seconds   Gastroc Stretch Limitations supine, also gentle mobilization through ankle eversion/inversion    Knee/Hip Exercises: Standing   Forward Lunges Both;1 set;10 reps   Forward Lunges Limitations 2 inch box    Other Standing Knee Exercises Cone tap exercise in standing with walker   Knee/Hip Exercises: Supine   Bridges Both;1 set;10 reps   Knee/Hip Exercises:  Sidelying   Clams 1x10, red TB                 PT Education - 03/11/15 1733    Education provided Yes   Education Details energy conservation especially due to fatigue at beginning of session    Person(s) Educated Patient   Methods Explanation   Comprehension Verbalized understanding          PT Short Term Goals - 03/04/15 1337    PT SHORT TERM GOAL #1   Title Patient will demonstrate a grade of at least 3+/5 in proximal musculature and core in order to promote functional stability and safe mobility skills    Time 6   Period Weeks   Status On-going  PT SHORT TERM GOAL #2   Title Patient will be able to participate in standing tasks with Min(A) from therapist and weights on limbs to reduce ataxia symptoms for at least 7 minutes at a time    Baseline 5/26- improved standing tolerance but not quite up to 7 minutes yet    Time 6   Period Weeks   Status On-going   PT SHORT TERM GOAL #3   Title Patient to experience no more than 4/10 at worst on a daily basis at rest and during mobility    Time 6   Period Weeks   Status On-going   PT SHORT TERM GOAL #4   Title Patient will demonstrate the ability to ambulate at least 38ft x6 in body weight support system with weighted extremities and minimal fatigue, good gait mechanics throughout    Baseline 5/26- walking about 125-169ft with walker, but does continue to display gait impairments    Time 6   Period Weeks   Status Achieved   PT SHORT TERM GOAL #5   Title Patient will be able to perform functional transfers, including sit<->stand and stand<->pivot from wheelchair to bed with close supervision and no loss of balance    Baseline 5/26- patient repots she is sometimes able to transfer herself safely at home with just supervision    Time 6   Period Weeks   Status On-going           PT Long Term Goals - 03/04/15 1341    PT LONG TERM GOAL #1   Title Patient will be independent in safely performed advanced HEP with  assistance from caregivers as needed, updated PRN    Time 12   Period Weeks   Status On-going   PT LONG TERM GOAL #2   Title Patient will demonstrate at least 4/5 to 4+/5 muscle strength in all proximal muscles, bilateral lower extremities, and core    Time 12   Period Weeks   Status On-going   PT LONG TERM GOAL #3   Title Patient will demonstrate bilateral ankle DF of at least 15 degrees in order to improve overall gait mechanics and functional mobility skills    Time 12   Period Weeks   Status On-going   PT LONG TERM GOAL #4   Title Patient will demonstrate the ability to ambulate at least 174ft with LRAD, no more than distant supervision, no loss of balance, and minimal fatigue throughout    Time 12   Period Weeks   Status On-going   PT LONG TERM GOAL #5   Title Patient will be independent in performing functional transfers, including all bed mobility skills and sit<->stand and stand pivot transfers with Mod(I) and LRAD    Time 12   Period Weeks   Status On-going   PT LONG TERM GOAL #6   Title Patient will demonstrate the ability to stand at least 15 minutes before fatiguing and with good posture and minimal loss of balance    Time 12   Period Weeks   Status On-going   PT LONG TERM GOAL #7   Title Patient will regularly perform weight bearing activities at home with assistance from caregivers as needed in order to protect skin and reduce detrimental effects of being sedentary    Time 12   Period Weeks   Status Achieved               Plan - 03/11/15 1734    Clinical Impression Statement Focus  on flexibility, proximal muscle strength, standing balance/strengthening tasks, and gait around corners and obstacles today. Patient fatigued at beginning of session and rest breaks provided through session as needed. Improved form with proximal muscle exercise today. Min guard-Min(A) for standing exercises with walker and gait today due to occasional posterior LOB. Difficulty  navigating around corners and obstacles as well today.    Pt will benefit from skilled therapeutic intervention in order to improve on the following deficits Abnormal gait;Decreased coordination;Decreased range of motion;Difficulty walking;Impaired flexibility;Improper body mechanics;Decreased endurance;Decreased safety awareness;Impaired sensation;Postural dysfunction;Decreased activity tolerance;Decreased balance;Decreased knowledge of use of DME;Pain;Decreased mobility;Decreased strength   Rehab Potential Good   Clinical Impairments Affecting Rehab Potential chronic ataxia symptoms however patient has reportedly been able to return to walking after other bouts of illness    PT Frequency 2x / week   PT Duration 8 weeks   PT Treatment/Interventions ADLs/Self Care Home Management;Gait training;Neuromuscular re-education;Visual/perceptual remediation/compensation;Stair training;Biofeedback;Functional mobility training;Patient/family education;Passive range of motion;Cryotherapy;Therapeutic activities;Wheelchair mobility training;Electrical Stimulation;Therapeutic exercise;Manual techniques;Energy conservation;DME Instruction;Balance training   PT Next Visit Plan Exercises for proximal musculature mat table; deep pressure massage to assist in relieving ankle tightness; functional standing tasks with therapist facilitation; gait training with standard walker; sitting and standing balance tasks    PT Home Exercise Plan bridges, SLRs, ankle PF/DF, sit to stand    Consulted and Agree with Plan of Care Patient        Problem List Patient Active Problem List   Diagnosis Date Noted  . Insomnia 02/02/2015  . MRSA pneumonia 10/11/2014  . Abdominal abscess   . Candidemia   . Screen for STD (sexually transmitted disease)   . 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 Perforated gastric ulcer 09/27/2014  . Prediabetes 07/23/2014  . Unspecified hereditary and idiopathic peripheral neuropathy 04/15/2014  .  CVA (cerebral infarction) 04/02/2014  . Hypertriglyceridemia 12/11/2013  . Stiffness of joint, not elsewhere classified, ankle and foot 11/12/2013  . Lack of coordination 11/03/2013  . Pernicious anemia 09/12/2013  . Elevated transaminase level 09/12/2013  . Ataxia 09/09/2013  . Difficulty in walking(719.7) 09/09/2013  . Weakness of both legs 09/09/2013  . Generalized weakness 02/11/2013  . Tobacco abuse 01/14/2013  . Pedal edema 01/13/2013  . Chronic pain syndrome 12/30/2012  . Reactive airway disease 12/30/2012    Deniece Ree PT, DPT [facility] [address] [place], [place], [address] Phone: [phone]   Fax:  [phone]

## 2015-03-12 DIAGNOSIS — J449 Chronic obstructive pulmonary disease, unspecified: Secondary | ICD-10-CM | POA: Diagnosis not present

## 2015-03-15 ENCOUNTER — Encounter (HOSPITAL_COMMUNITY): Payer: Self-pay | Admitting: Hematology & Oncology

## 2015-03-15 ENCOUNTER — Encounter (HOSPITAL_COMMUNITY): Payer: BLUE CROSS/BLUE SHIELD | Attending: Hematology & Oncology | Admitting: Hematology & Oncology

## 2015-03-15 ENCOUNTER — Other Ambulatory Visit (HOSPITAL_COMMUNITY): Payer: Self-pay

## 2015-03-15 VITALS — BP 90/60 | HR 70 | Temp 97.5°F | Resp 20 | Wt 138.0 lb

## 2015-03-15 DIAGNOSIS — G8929 Other chronic pain: Secondary | ICD-10-CM | POA: Insufficient documentation

## 2015-03-15 DIAGNOSIS — D6851 Activated protein C resistance: Secondary | ICD-10-CM | POA: Diagnosis not present

## 2015-03-15 DIAGNOSIS — I748 Embolism and thrombosis of other arteries: Secondary | ICD-10-CM | POA: Diagnosis not present

## 2015-03-15 DIAGNOSIS — G709 Myoneural disorder, unspecified: Secondary | ICD-10-CM | POA: Insufficient documentation

## 2015-03-15 DIAGNOSIS — M792 Neuralgia and neuritis, unspecified: Secondary | ICD-10-CM | POA: Insufficient documentation

## 2015-03-15 DIAGNOSIS — Z7901 Long term (current) use of anticoagulants: Secondary | ICD-10-CM

## 2015-03-15 DIAGNOSIS — I749 Embolism and thrombosis of unspecified artery: Secondary | ICD-10-CM | POA: Insufficient documentation

## 2015-03-15 DIAGNOSIS — R29898 Other symptoms and signs involving the musculoskeletal system: Secondary | ICD-10-CM | POA: Insufficient documentation

## 2015-03-15 DIAGNOSIS — M549 Dorsalgia, unspecified: Secondary | ICD-10-CM | POA: Insufficient documentation

## 2015-03-15 NOTE — Progress Notes (Signed)
[place] Progress NOTE  Patient Care Team: Kathyrn Drown, MD as PCP - General (Family Medicine) Flossie Buffy. Redmond Pulling, MD as Referring Physician (Otolaryngology)  CHIEF COMPLAINTS/PURPOSE OF CONSULTATION:   LUE Arterial Thrombus in the setting of acute illness History of pernicious anemia Multiple Sclerosis History of Guillian Barre Syndrome  ? 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 female is here because of an arterial thrombosis. She has a very complicated medical history. She was admitted to the [facility] system on December 19 of 2015 with a CT of the abdomen showing pneumoperitoneum. She had a cold left hand concerning for an arterial clot. On 09/27/2014 she underwent a left brachial and ulnar artery embolectomy and also an exploratory laparotomy with an omental patch for a perforated prepyloric gastric ulcer. She developed a reocclusion of the left upper extremity and underwent a left brachial to left radial artery bypass with left leg greater saphenous vein. During her hospital admission she was coded for 4 minutes. She remarkably improved and was discharged from the hospital.  Prior to her hospitalization the patient noted a 1 day history of her hand feeling "weird" she has a history of neuropathy in that hand. At the time of her hospitalization in December she was a current smoker.   She is here today to review her laboratory studies and discuss her anticoagulation. She goes to therapy three times a week, including speech and leg therapy. She feels she is making small improvements. She sees Dr. [name] again this  Friday.  MEDICAL HISTORY:  Past Medical History  Diagnosis Date  . Anxiety   . Depression   . COPD (chronic obstructive pulmonary disease)   . MS (multiple sclerosis)   . Fibromyalgia   . Impaired fasting glucose   . 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 Eating disorder   . Chronic low back pain   . PONV (postoperative nausea and vomiting)   . Chronic bronchitis     "yearly" (02/11/2013)  . Borderline diabetes   . GERD (gastroesophageal reflux disease)   . Migraine     "I use to" (02/11/2013)  . Arthritis     "hands & feet" (02/11/2013)  . Peripheral neuropathy     [name] 02/11/2013  . B12 deficiency anemia     [name] 02/11/2013  . Chronic pain syndrome     [name] 02/11/2013  . UTI (lower urinary tract infection) 02/11/2013    [name] 02/11/2013  . Chronic edema     BLE/notes 02/11/2013  . Ataxia   . Peripheral neuropathy   . Muscle weakness of lower extremity     bilateral  . Hypertension     SURGICAL HISTORY: Past Surgical History  Procedure Laterality Date  . Abdominal hysterectomy  ~ 1987; ~ 2004    "wood[place]; ferguson" (02/11/2013)  . Cesarean section  3546; 1984; 1986  . Anterior cervical decomp/discectomy fusion  2009; 2011  . Tonsillectomy  1990's?  . Cataract extraction w/phaco  06/20/2011    Procedure: CATARACT EXTRACTION PHACO AND INTRAOCULAR LENS PLACEMENT (IOC);  Surgeon: [name] T. Gershon Crane;  Location: [facility];  Service: Ophthalmology;  Laterality: Left;  CDE  1.81  . Cataract extraction w/phaco  07/04/2011    Procedure: CATARACT EXTRACTION PHACO AND INTRAOCULAR LENS PLACEMENT (IOC);  Surgeon: [name] T. Gershon Crane;  Location: [facility];  Service: Ophthalmology;  Laterality: Right;  CDE: 1.76  . Yag laser application Right 5/63/1497    Procedure: YAG LASER APPLICATION;  Surgeon: [name] T. Gershon Crane, MD;  Location: [facility];  Service: Ophthalmology;  Laterality: Right;  . Yag laser application Left 0/26/3785    Procedure: YAG LASER APPLICATION;  Surgeon: [name]  T. Gershon Crane, MD;  Location: [facility];  Service: Ophthalmology;  Laterality: Left;  . Appendectomy    . Laparotomy N/A 09/27/2014    Procedure: Exploratory Laparotomy, Biopsy of Perforated Gastric Ulcer, Closure with omental patch;  Surgeon: [name], MD;  Location: [place];  Service: General;  Laterality: N/A;  . Embolectomy Left 09/27/2014    Procedure: Left Brachial, Radial,Ulnar Embolectomy with patch angioplasty left brachial, radial and ulnar artery;  Surgeon: [name], MD;  Location: [place] [facility];  Service: Vascular;  Laterality: Left;  . Embolectomy Left 09/27/2014    Procedure: Left Radial, Brachial, and Ulnar Thrombectomy; Left Brachial to Radial Bypass Graft using Greater Saphenous vein graft from Left Thigh; Left Saphenous Vein Harvest; Intraoperative Arteriogram; Intra-arterial administration of TPA;  Surgeon: [name], MD;  Location: [place];  Service: Vascular;  Laterality: Left;  . Tracheostomy N/A december 2015    SOCIAL HISTORY: History   Social History  . Marital Status: Divorced    Spouse Name: N/A  . Number of Children: N/A  . Years of Education: N/A   Occupational History  . Not on file.   Social History Main Topics  . Smoking status: Current Every Day Smoker -- 1.00 packs/day for 33 years    Types: Cigarettes  . Smokeless tobacco: Former Systems developer    Quit date: 09/08/2014  . Alcohol Use: No  . Drug Use: No  . Sexual Activity: Not Currently    Birth Control/ Protection: Surgical   Other Topics Concern  . Not on file   Social History Narrative   he is divorced. She was a 2 pack per day smoker her entire life until she quit in December of last year. She has 2 sons, 3 grandchildren. She does not drink alcohol. 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   FAMILY HISTORY: Family History  Problem Relation Age of Onset  . Anesthesia problems Neg Hx   . Hypotension Neg Hx   . Malignant hyperthermia Neg Hx   . Pseudochol deficiency Neg Hx   .  Hyperlipidemia Mother   . Heart disease Mother   . Cancer Mother     lung  . Diabetes Father   . Heart disease Father   . Cancer Maternal Grandmother     breast   indicated that her mother is deceased. She indicated that her father is deceased.  Other died at 80 from lung cancer, she was a heavy smoker. Father died at 79 from an MI. She has no family history of blood clots. He has 2 brothers and 1 sister.   ALLERGIES:  is allergic to ambien; ceftin; hctz; lodine; promethazine; roxicodone; codeine; latex; and penicillins.  MEDICATIONS:  Current Outpatient Prescriptions  Medication Sig Dispense Refill  . apixaban (ELIQUIS) 5 MG TABS tablet Take 1 tablet (5 mg total) by mouth 2 (two) times daily. 60 tablet 5  . folic acid (FOLVITE) 1 MG tablet Take 1 tablet (1 mg total) by mouth daily.  30 tablet 5  . gabapentin (NEURONTIN) 300 MG capsule Take 1 capsule (300 mg total) by mouth 2 (two) times daily. 60 capsule 5  . ONE TOUCH ULTRA TEST test strip     . ONETOUCH DELICA LANCETS 99M MISC     . oxyCODONE-acetaminophen (PERCOCET/ROXICET) 5-325 MG per tablet Take 1 tablet by mouth 3 (three) times daily as needed for severe pain. 90 tablet 0  . pantoprazole (PROTONIX) 40 MG tablet Take 1 tablet (40 mg total) by mouth daily. 30 tablet 6  . potassium chloride (K-DUR) 10 MEQ tablet Take 2 tablets (20 mEq total) by mouth daily. 60 tablet 5  . senna (SENOKOT) 8.6 MG tablet Take 1 tablet (8.6 mg total) by mouth 2 (two) times daily. 60 tablet 5  . Thiamine HCl (VITAMIN B-1 PO) Take by mouth daily.    . traZODone (DESYREL) 50 MG tablet Can take up to 150mg  nightly (Patient taking differently: Take 150 mg by mouth at bedtime. Can take up to 150mg  nightly) 90 tablet 3  . valACYclovir (VALTREX) 1000 MG tablet Take 1,000 mg by mouth 3 (three) times daily. For 7 days    . vitamin B-12 (CYANOCOBALAMIN) 1000 MCG tablet Take 1,000 mcg by mouth daily.     No current facility-administered medications for this  visit.    Review of Systems  Constitutional: Positive for malaise/fatigue.  HENT: Negative.   Eyes: Negative.   Respiratory: Positive for shortness of breath.   Cardiovascular: Negative.   Gastrointestinal: Positive for constipation.  Genitourinary: Negative.   Musculoskeletal: Positive for myalgias, back pain and joint pain.  Skin: Negative.   Neurological: Positive for sensory change and weakness.  Endo/Heme/Allergies: Negative.   Psychiatric/Behavioral: The patient has insomnia.   14 point review of systems was performed and is negative except as detailed under history of present illness and above   PHYSICAL EXAMINATION:  ECOG PERFORMANCE STATUS: 2 - Symptomatic, <50% confined to bed  Filed Vitals:   03/15/15 1335  BP: 90/60  Pulse: 70  Temp: 97.5 F (36.4 C)  Resp: 20   Filed Weights   03/15/15 1335  Weight: 138 lb (62.596 kg)     Physical Exam  Constitutional: She is oriented to person, place, and time and well-developed, well-nourished, and in no distress.  In a wheelchair  HENT:  Head: Normocephalic and atraumatic.  Nose: Nose normal.  Mouth/Throat: Oropharynx is clear and moist. No oropharyngeal exudate.  Eyes: Conjunctivae and EOM are normal. Pupils are equal, round, and reactive to light. Right eye exhibits no discharge. Left eye exhibits no discharge. No scleral icterus.  Neck: Normal range of motion. Neck supple. No tracheal deviation present. No thyromegaly present.  Cardiovascular: Normal rate, regular rhythm and normal heart sounds.  Exam reveals no gallop and no friction rub.   No murmur heard. Pulmonary/Chest: Effort normal and breath sounds normal. She has no wheezes. She has no rales.  Abdominal: Soft. Bowel sounds are normal. She exhibits no distension and no mass. There is no tenderness. There is no rebound and no guarding.  Musculoskeletal: Normal range of motion.   Lymphadenopathy:    She has no cervical adenopathy.  Neurological: She is  alert and oriented to person, place, and time. No cranial nerve deficit.  Gait not assessed  Skin: Skin is warm and dry. No rash noted.  Psychiatric: Mood, memory, affect and judgment normal.  Nursing note and vitals reviewed.    LABORATORY DATA:  I have reviewed the data as listed  Results for NORAH, DEVIN (MRN [id number]) as of 03/18/2015 17:19  Ref. Range 01/13/2015 16:10 02/02/2015 14:59 02/10/2015 12:32  Anticardiolipin IgA Latest Ref Range: 0-11 APL U/mL <9    Anticardiolipin IgG Latest Ref Range: 0-14 GPL U/mL <9    Anticardiolipin IgM Latest Ref Range: 0-12 MPL U/mL <9    PTT Lupus Anticoagulant Latest Ref Range: 0.0-50.0 sec NFPR  40.5  DRVVT Latest Ref Range: 0.0-55.1 sec TNP  47.4  Lupus Anticoag Interp Unknown   Comment:  Beta-2 Glyco I IgG Latest Ref Range: 0-20 GPI IgG units <9    Beta-2-Glycoprotein I IgA Latest Ref Range: 0-25 GPI IgA units <9    Beta-2-Glycoprotein I IgM Latest Ref Range: 0-32 GPI IgM units <9    Antithrombin Activity Latest Ref Range: 75-120 %   121 (H)  D-Dimer, Quant Latest Ref Range: 0.00-0.48 ug/mL-FEU   <0.27  Recommendations-F5LEID: Unknown Comment (A)    Recommendations-PTGENE: Unknown Comment    Additional Information Unknown Comment    Protein C Activity Latest Ref Range: 74-151 %   159 (H)  Protein C, Total Latest Ref Range: 70-140 %   119  Protein S Activity Latest Ref Range: 60-145 %   73  Protein S Ag, Total Latest Ref Range: 58-150 %   137     ASSESSMENT & PLAN:  Factor V Leiden mutation, heterozygous Arterial thrombosis L brachial radial and ulnar arteries Thrombosis of L Subclavian Artery Thrombectomy of L brachial, radial, and ulnar arteries Patch angioplasty of the Left Brachial and ulnar artery Patch angioplasty of the Left Radial artery Redo of left brachial, ulnar and radial exposure Thrombectomy Left brachial, ulnar and radial artery Angiogram LUE Left brachial to left radial artery bypass with left leg greater saphenous  vein Intra-arterial injection of TPA   49 year old female with multiple sclerosis and limited functional status secondary to her disease. She presented with abdominal pain and was noted at presentation to have a cold left upper extremity.She underwent an embolectomy,  reoccluded and ultimately underwent a left brachial to left radial artery bypass graft with left leg greater saphenous vein. Ultrasound also showed thrombosis of the left subclavian artery. TEE was performed that was negative for source of emboli.  She is currently on Eliquis  She needs to continue with anticoagulation. She is heterozygous for Factor V Leiden. Although not commonly associated with arterial thrombosis, I recommend ongoing Eliquis therapy for now especially given the serious nature of her thrombosis. Long term,  there is no good data on how to proceed.    She was given Factor V Leiden information. She was sdvised to share the diagnosis with her children so they can be tested. In addition, I encouraged her to have family come to her next follow-up for additional discussion if needed.   Follow up in a couple months.  All questions were answered. The patient knows to call the clinic with any problems, questions or concerns. This note was electronically signed.    This document serves as a record of services personally performed by Ancil Linsey, MD. It was created on her behalf by Arlyce Harman, a trained medical scribe. The creation of this record is based on the scribe's personal observations and the provider's statements to them. This document has been checked and approved by the attending provider.  I have reviewed the above documentation for accuracy and completeness, and I agree with the above.  Molli Hazard, MD  03/18/2015 5:19 PM

## 2015-03-15 NOTE — Patient Instructions (Signed)
Register at [facility] Discharge Instructions  RECOMMENDATIONS MADE BY THE CONSULTANT AND ANY TEST RESULTS WILL BE SENT TO YOUR REFERRING PHYSICIAN.  Office visit with Dr. Whitney Muse today. Return in 3 months for office visit.  Thank you for choosing [place] [facility] to provide your oncology and hematology care.  To afford each patient quality time with our provider, please arrive at least 15 minutes before your scheduled appointment time.    You need to re-schedule your appointment should you arrive 10 or more minutes late.  We strive to give you quality time with our providers, and arriving late affects you and other patients whose appointments are after yours.  Also, if you no show three or more times for appointments you may be dismissed from the clinic at the providers discretion.     Again, thank you for choosing [facility].  Our hope is that these requests will decrease the amount of time that you wait before being seen by our physicians.       _____________________________________________________________  Should you have questions after your visit to [facility], please contact our office at [phone] between the hours of 8:30 a.m. and 4:30 p.m.  Voicemails left after 4:30 p.m. will not be returned until the following business day.  For prescription refill requests, have your pharmacy contact our office.

## 2015-03-16 ENCOUNTER — Ambulatory Visit (HOSPITAL_COMMUNITY): Payer: BLUE CROSS/BLUE SHIELD

## 2015-03-16 ENCOUNTER — Encounter (HOSPITAL_COMMUNITY): Payer: Self-pay | Admitting: Physical Therapy

## 2015-03-16 ENCOUNTER — Encounter (HOSPITAL_COMMUNITY): Payer: Self-pay

## 2015-03-16 DIAGNOSIS — R29898 Other symptoms and signs involving the musculoskeletal system: Secondary | ICD-10-CM

## 2015-03-16 DIAGNOSIS — R269 Unspecified abnormalities of gait and mobility: Secondary | ICD-10-CM

## 2015-03-16 DIAGNOSIS — G709 Myoneural disorder, unspecified: Secondary | ICD-10-CM

## 2015-03-16 DIAGNOSIS — Z7409 Other reduced mobility: Secondary | ICD-10-CM

## 2015-03-16 DIAGNOSIS — M25642 Stiffness of left hand, not elsewhere classified: Secondary | ICD-10-CM

## 2015-03-16 DIAGNOSIS — R293 Abnormal posture: Secondary | ICD-10-CM

## 2015-03-16 DIAGNOSIS — R279 Unspecified lack of coordination: Secondary | ICD-10-CM

## 2015-03-16 DIAGNOSIS — M6289 Other specified disorders of muscle: Secondary | ICD-10-CM | POA: Diagnosis not present

## 2015-03-16 DIAGNOSIS — M6281 Muscle weakness (generalized): Secondary | ICD-10-CM

## 2015-03-16 DIAGNOSIS — R270 Ataxia, unspecified: Secondary | ICD-10-CM | POA: Diagnosis not present

## 2015-03-16 NOTE — Therapy (Signed)
[place], [place], [address] Phone: [phone]   Fax:  [phone]  Physical Therapy Treatment  Patient Details  Name: [name] MRN: [id number] Date of Birth: [birth year] Referring Provider:  Kathyrn Drown, MD  Encounter Date: 03/16/2015      PT End of Session - 03/16/15 1312    Visit Number 9   Number of Visits 24   Date for PT Re-Evaluation 04/01/15   Authorization Type Medicare    Authorization Time Period 02/03/15 to 04/05/15; G-code done 6th visit    Authorization - Visit Number 9   Authorization - Number of Visits 16   PT Start Time 1308   PT Stop Time 1346   PT Time Calculation (min) 38 min   Equipment Utilized During Treatment Gait belt   Activity Tolerance Patient tolerated treatment well   Behavior During Therapy [facility] for tasks assessed/performed      Past Medical History  Diagnosis Date  . Anxiety   . Depression   . COPD (chronic obstructive pulmonary disease)   . MS (multiple sclerosis)   . Fibromyalgia   . Impaired fasting glucose   . 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 Eating disorder   . Chronic low back pain   . PONV (postoperative nausea and vomiting)   . Chronic bronchitis     "yearly" (02/11/2013)  . Borderline diabetes   . GERD (gastroesophageal reflux disease)   . Migraine     "I use to" (02/11/2013)  . Arthritis     "hands & feet" (02/11/2013)  . Peripheral neuropathy     [name] 02/11/2013  . B12 deficiency anemia     [name] 02/11/2013  . Chronic pain syndrome     [name] 02/11/2013  . UTI (lower urinary tract infection) 02/11/2013    [name] 02/11/2013  . Chronic edema     BLE/notes 02/11/2013  . Ataxia   . Peripheral neuropathy   . Muscle weakness of lower extremity     bilateral  . Hypertension     Past Surgical History  Procedure Laterality Date  . Abdominal hysterectomy  ~ 1987; ~ 2004    "woodward; ferguson" (02/11/2013)  . Cesarean  section  1093; 1984; 1986  . Anterior cervical decomp/discectomy fusion  2009; 2011  . Tonsillectomy  1990's?  . Cataract extraction w/phaco  06/20/2011    Procedure: CATARACT EXTRACTION PHACO AND INTRAOCULAR LENS PLACEMENT (IOC);  Surgeon: [name] T. Gershon Crane;  [facility]: [facility];  Service: Ophthalmology;  Laterality: Left;  CDE 1.81  . Cataract extraction w/phaco  07/04/2011    Procedure: CATARACT EXTRACTION PHACO AND INTRAOCULAR LENS PLACEMENT (IOC);  Surgeon: [name] T. Gershon Crane;  Location: [facility];  Service: Ophthalmology;  Laterality: Right;  CDE: 1.76  . Yag laser application Right 2/35/5732    Procedure: YAG LASER APPLICATION;  Surgeon: [name] T. Gershon Crane, MD;  Location: [facility];  Service: Ophthalmology;  Laterality: Right;  . Yag laser application Left 11/10/5425    Procedure: YAG LASER APPLICATION;  Surgeon: [name] T. Gershon Crane, MD;  Location: [facility];  Service: Ophthalmology;  Laterality: Left;  . Appendectomy    . Laparotomy N/A 09/27/2014    Procedure: Exploratory Laparotomy, Biopsy of Perforated Gastric Ulcer, Closure with omental patch;  Surgeon: [name], MD;  Location: [facility];  Service: General;  Laterality: N/A;  . Embolectomy Left 09/27/2014    Procedure: Left Brachial, Radial,Ulnar Embolectomy with patch angioplasty left brachial, radial and ulnar artery;  Surgeon: [name], MD;  Location: [facility] OR;  Service: Vascular;  Laterality: Left;  . Embolectomy Left 09/27/2014    Procedure: Left Radial, Brachial, and Ulnar Thrombectomy; Left Brachial to Radial Bypass Graft using Greater Saphenous vein graft from Left Thigh; Left Saphenous Vein Harvest; Intraoperative Arteriogram; Intra-arterial administration of TPA;  Surgeon: [name], MD;  Location: [facility] OR;  Service: Vascular;  Laterality: Left;  . Tracheostomy N/A december 2015    There were no vitals filed for this visit.  Visit Diagnosis:  Ataxia  Neuromuscular disease  Weakness of both legs  Proximal muscle weakness  Impaired  functional mobility and activity tolerance  Abnormality of gait  Poor posture  Lack of coordination      Subjective Assessment - 03/16/15 1310    Subjective Pt reports she is hurting Bil hands, feet and legs throbbing and tight pain scale 7/10   Currently in Pain? Yes   Pain Score 7    Pain Location Leg   Pain Orientation Right;Left   Pain Descriptors / Indicators Throbbing;Tightness                         [facility] Adult PT Treatment/Exercise - 03/16/15 0001    Bed Mobility   Rolling Right 5: Supervision   Rolling Left 5: Supervision   Transfers   Sit to Stand 4: Min assist   Stand to Sit 4: Min guard   Stand Pivot Transfers 4: Min assist   Ambulation/Gait   Ambulation/Gait Yes   Ambulation/Gait Assistance 4: Min guard   Ambulation/Gait Assistance Details RW   Ambulation Distance (Feet) 226 Feet   Assistive device Rolling walker   Knee/Hip Exercises: Stretches   Passive Hamstring Stretch 3 reps;30 seconds   Piriformis Stretch 3 reps;30 seconds   Piriformis Stretch Limitations supine manual figure 4   Gastroc Stretch 30 seconds;3 reps   Gastroc Stretch Limitations supine, also gentle mobilization through ankle eversion/inversion    Knee/Hip Exercises: Standing   Forward Lunges Both;1 set;10 reps   Forward Lunges Limitations 2 inch box    Other Standing Knee Exercises Standing marches 1x10; heel raises 1x10   Knee/Hip Exercises: Seated   Other Seated Knee Exercises Toe raises   Knee/Hip Exercises: Supine   Bridges Both;2 sets;15 reps   Bridges Limitations manual facilitation for full range bridg3; 2nd set with RTB around knees for stability   Other Supine Knee Exercises TKR 5x Bil   Other Supine Knee Exercises ankle pumps: DF/PF/ CW/CCW   Manual Therapy   Soft tissue mobilization deep pressure mobilization of bilateral distal gastrocs and achilles                  PT Short Term Goals - 03/16/15 1313    PT SHORT TERM GOAL #1   Title  Patient will demonstrate a grade of at least 3+/5 in proximal musculature and core in order to promote functional stability and safe mobility skills    Status On-going   PT SHORT TERM GOAL #2   Title Patient will be able to participate in standing tasks with Min(A) from therapist and weights on limbs to reduce ataxia symptoms for at least 7 minutes at a time    Status On-going   PT SHORT TERM GOAL #3   Title Patient to experience no more than 4/10 at worst on a daily basis at rest and during mobility    Status On-going   PT SHORT TERM GOAL #4  Title Patient will demonstrate the ability to ambulate at least 75ft x6 in body weight support system with weighted extremities and minimal fatigue, good gait mechanics throughout    Status Achieved   PT SHORT TERM GOAL #5   Title Patient will be able to perform functional transfers, including sit<->stand and stand<->pivot from wheelchair to bed with close supervision and no loss of balance    Status On-going   PT SHORT TERM GOAL #6   Title Patient will demonstrate improved posture while seated in wheelchair and will be able to state and demonstrate the importance of performing appropriate pressure relieving techniques consistently            PT Long Term Goals - 03/16/15 1314    PT LONG TERM GOAL #1   Title Patient will be independent in safely performed advanced HEP with assistance from caregivers as needed, updated PRN    Status On-going   PT LONG TERM GOAL #2   Title Patient will demonstrate at least 4/5 to 4+/5 muscle strength in all proximal muscles, bilateral lower extremities, and core    Status On-going   PT LONG TERM GOAL #3   Title Patient will demonstrate bilateral ankle DF of at least 15 degrees in order to improve overall gait mechanics and functional mobility skills    Status On-going   PT LONG TERM GOAL #4   Title Patient will demonstrate the ability to ambulate at least 191ft with LRAD, no more than distant supervision, no  loss of balance, and minimal fatigue throughout    Status On-going   PT LONG TERM GOAL #5   Title Patient will be independent in performing functional transfers, including all bed mobility skills and sit<->stand and stand pivot transfers with Mod(I) and LRAD    Status On-going   PT LONG TERM GOAL #6   Title Patient will demonstrate the ability to stand at least 15 minutes before fatiguing and with good posture and minimal loss of balance    PT LONG TERM GOAL #7   Title Patient will regularly perform weight bearing activities at home with assistance from caregivers as needed in order to protect skin and reduce detrimental effects of being sedentary    Status Achieved               Plan - 03/16/15 1357    Clinical Impression Statement Session focus on improing ROM and gait mechanics.  Continued with manual stretches to piriformis, hamstirng and gastroc/soleus comples with deep tissue added for increased ROM with spasticity Bil ankle movements.  Improved gait mechanics with less assistance required but does continue to require cueing to improve gait mechanics and assistance to reduce risk of falls, continued with weight around core and ankles to decrease tone with gait.  Pt reported pan reduced to 4/10 at end of session.     PT Next Visit Plan Exercises for proximal musculature mat table; deep pressure massage to assist in relieving ankle tightness; functional standing tasks with therapist facilitation; gait training with standard walker; sitting and standing balance tasks         Problem List Patient Active Problem List   Diagnosis Date Noted  . Insomnia 02/02/2015  . MRSA pneumonia 10/11/2014  . Abdominal abscess   . Candidemia   . Screen for STD (sexually transmitted disease)   . 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 Perforated gastric ulcer 09/27/2014  . Prediabetes 07/23/2014  . Unspecified hereditary and idiopathic peripheral neuropathy 04/15/2014  . CVA (cerebral  infarction) 04/02/2014  . Hypertriglyceridemia 12/11/2013  . Stiffness of joint, not elsewhere classified, ankle and foot 11/12/2013  . Lack of coordination 11/03/2013  . Pernicious anemia 09/12/2013  . Elevated transaminase level 09/12/2013  . Ataxia 09/09/2013  . Difficulty in walking(719.7) 09/09/2013  . Weakness of both legs 09/09/2013  . Generalized weakness 02/11/2013  . Tobacco abuse 01/14/2013  . Pedal edema 01/13/2013  . Chronic pain syndrome 12/30/2012  . Reactive airway disease 12/30/2012   [name], PTA  [name] 03/16/2015, 2:06 PM  [facility] [address] [place], [place], [address] Phone: [phone]   Fax:  [phone]

## 2015-03-16 NOTE — Therapy (Signed)
[place], [place], [address] Phone: [phone]   Fax:  [phone]  Occupational Therapy Treatment  Patient Details  Name: [name] MRN: [id number] Date of Birth: [birth year] Referring Provider:  Kathyrn Drown, MD  Encounter Date: 03/16/2015      OT End of Session - 03/16/15 1510    Visit Number 2   Number of Visits 16   Date for OT Re-Evaluation 05/10/15  mini reassessment: 04/08/15   Authorization Type Medicare A   Authorization Time Period before 10th visit   Authorization - Visit Number 2   Authorization - Number of Visits 10   OT Start Time 1355   OT Stop Time 1435   OT Time Calculation (min) 40 min   Activity Tolerance Patient tolerated treatment well   Behavior During Therapy [facility] for tasks assessed/performed      Past Medical History  Diagnosis Date  . Anxiety   . Depression   . COPD (chronic obstructive pulmonary disease)   . MS (multiple sclerosis)   . Fibromyalgia   . Impaired fasting glucose   . 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 Eating disorder   . Chronic low back pain   . PONV (postoperative nausea and vomiting)   . Chronic bronchitis     "yearly" (02/11/2013)  . Borderline diabetes   . GERD (gastroesophageal reflux disease)   . Migraine     "I use to" (02/11/2013)  . Arthritis     "hands & feet" (02/11/2013)  . Peripheral neuropathy     [name] 02/11/2013  . B12 deficiency anemia     [name] 02/11/2013  . Chronic pain syndrome     [name] 02/11/2013  . UTI (lower urinary tract infection) 02/11/2013    [name] 02/11/2013  . Chronic edema     BLE/notes 02/11/2013  . Ataxia   . Peripheral neuropathy   . Muscle weakness of lower extremity     bilateral  . Hypertension     Past Surgical History  Procedure Laterality Date  . Abdominal hysterectomy  ~ 1987; ~ 2004    "woodward; ferguson" (02/11/2013)  . Cesarean section  0814; 1984; 1986  . Anterior  cervical decomp/discectomy fusion  2009; 2011  . Tonsillectomy  1990's?  . Cataract extraction w/phaco  06/20/2011    Procedure: CATARACT EXTRACTION PHACO AND INTRAOCULAR LENS PLACEMENT (IOC);  Surgeon: [name] T. Gershon Crane;  Location: [facility];  Service: Ophthalmology;  Laterality: Left;  CDE 1.81  . Cataract extraction w/phaco  07/04/2011    Procedure: CATARACT EXTRACTION PHACO AND INTRAOCULAR LENS PLACEMENT (IOC);  Surgeon: [name] T. Gershon Crane;  Location: [facility];  Service: Ophthalmology;  Laterality: Right;  CDE: 1.76  . Yag laser application Right 4/81/8563    Procedure: YAG LASER APPLICATION;  Surgeon: [name] T. Gershon Crane, MD;  Location: [facility];  Service: Ophthalmology;  Laterality: Right;  . Yag laser application Left 1/49/7026    Procedure: YAG LASER APPLICATION;  Surgeon: [name] T. Gershon Crane, MD;  Location: [facility];  Service: Ophthalmology;  Laterality: Left;  . Appendectomy    . Laparotomy N/A 09/27/2014    Procedure: Exploratory Laparotomy, Biopsy of Perforated Gastric Ulcer, Closure with omental patch;  Surgeon: [name], MD;  Location: [facility];  Service: General;  Laterality: N/A;  . Embolectomy Left 09/27/2014    Procedure: Left Brachial, Radial,Ulnar Embolectomy with patch angioplasty left brachial, radial and ulnar artery;  Surgeon: [name], MD;  Location: [facility]  OR;  Service: Vascular;  Laterality: Left;  . Embolectomy Left 09/27/2014    Procedure: Left Radial, Brachial, and Ulnar Thrombectomy; Left Brachial to Radial Bypass Graft using Greater Saphenous vein graft from Left Thigh; Left Saphenous Vein Harvest; Intraoperative Arteriogram; Intra-arterial administration of TPA;  Surgeon: [name], MD;  Location: [facility] OR;  Service: Vascular;  Laterality: Left;  . Tracheostomy N/A december 2015    There were no vitals filed for this visit.  Visit Diagnosis:  Stiffness of hand joint, left      Subjective Assessment - 03/16/15 1459    Subjective  S: It hurts only for a second when you  stretch my fingers.    Currently in Pain? No/denies            [facility] OT Assessment - 03/16/15 1500    Assessment   Diagnosis left hand spaticity   Precautions   Precautions Fall                  OT Treatments/Exercises (OP) - 03/16/15 1500    Splinting   Splinting Left hand Dupuytrens splint fabricated to decrease 3rd-5th metacarpal staticity. Splint is volar based leaving thumb and index finger free and functional. Straps were placed at the wrist and DIP joint of 3rd -5th MCPs.                  OT Education - 03/16/15 1511    Education provided Yes   Education Details Splint wear   Person(s) Educated Patient;Caregiver(s)   Methods Explanation;Demonstration;Handout   Comprehension Verbalized understanding          OT Short Term Goals - 03/16/15 1513    OT SHORT TERM GOAL #1   Title Patient will be educated and independent with HEP.   Status On-going   OT SHORT TERM GOAL #2   Title Patient will increase AROM of left hand by 5 degrees to increase ability to use left hand during daily tasks.    Status On-going   OT SHORT TERM GOAL #3   Title Therapist will fabricate finger extension splint to defer the chance of progressing spaticity.    Status On-going   OT SHORT TERM GOAL #4   Title Patient will increase coordination in Bil hands by decreasing completion time of  9 hole peg test by 10 seconds.    Status On-going   OT SHORT TERM GOAL #5   Title Patient will increase grip strength by 5# and pinch strength by 3#  of left hand to increase ability to use left hand as an active assist during daily tasks.    Status On-going           OT Long Term Goals - 03/16/15 1513    OT LONG TERM GOAL #1   Title Patient will return to highest level of independence with all daily activities.    Status On-going   OT LONG TERM GOAL #2   Title Patient will increase AROM of left hand by 10 degrees.   Status On-going   OT LONG TERM GOAL #3   Title Patient will  increase PROM of left hand to [facility] to decrease the progression of spaticity   Status On-going   OT LONG TERM GOAL #4   Title Patient will increase grip strength by 10# and pinch strength by 5# in LUE.   Status On-going   OT LONG TERM GOAL #5   Title Patient will increase Bil coordination by decreasing completion time of 9  hole peg test by 30 seconds.    Status On-going               Plan - 03/16/15 1511    Clinical Impression Statement A: Dupuytrens splint fabricated to help decrease spaticity of left 3rd-5th MCP. Instructed patient that if her fingers feel like they could be straighten more later on we will adjust her splint.    Plan P: Passive stretching to left hand, grip and pinch strengthening for bilateral hands, functional coordinationa task. Follow up on splint.         Problem List Patient Active Problem List   Diagnosis Date Noted  . Insomnia 02/02/2015  . MRSA pneumonia 10/11/2014  . Abdominal abscess   . Candidemia   . Screen for STD (sexually transmitted disease)   . 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 Perforated gastric ulcer 09/27/2014  . Prediabetes 07/23/2014  . Unspecified hereditary and idiopathic peripheral neuropathy 04/15/2014  . CVA (cerebral infarction) 04/02/2014  . Hypertriglyceridemia 12/11/2013  . Stiffness of joint, not elsewhere classified, ankle and foot 11/12/2013  . Lack of coordination 11/03/2013  . Pernicious anemia 09/12/2013  . Elevated transaminase level 09/12/2013  . Ataxia 09/09/2013  . Difficulty in walking(719.7) 09/09/2013  . Weakness of both legs 09/09/2013  . Generalized weakness 02/11/2013  . Tobacco abuse 01/14/2013  . Pedal edema 01/13/2013  . Chronic pain syndrome 12/30/2012  . Reactive airway disease 12/30/2012    Ailene Ravel, OTR/L,CBIS  [phone]  03/16/2015, 3:15 PM  [facility] [address] [place], [place], [address] Phone: [phone]   Fax:   [phone]

## 2015-03-16 NOTE — Patient Instructions (Signed)
Your Splint This splint should initially be fitted by a healthcare practitioner.  The healthcare practitioner is responsible for providing wearing instructions and precautions to the patient, other healthcare practitioners and care provider involved in the patient's care.  This splint was custom made for you. Please read the following instructions to learn about wearing and caring for your splint.  Precautions Should your splint cause any of the following problems, remove the splint immediately and contact your therapist/physician.  Swelling  Severe Pain  Pressure Areas  Stiffness  Numbness  Do not wear your splint while operating machinery unless it has been fabricated for that purpose.  When To Wear Your Splint Where your splint according to your therapist/physician instructions. Nighttime and during the daytime hours while awake (2 hours on and 1 hour off)  Care and Cleaning of Your Splint 1. Keep your splint away from open flames. 2. Your splint will lose its shape in temperatures over 135 degrees Farenheit, ( in car windows, near radiators, ovens or in hot water).  Never make any adjustments to your splint, if the splint needs adjusting remove it and make an appointment to see your therapist. 3. Your splint, including the cushion liner may be cleaned with soap and lukewarm water.  Do not immerse in hot water over 135 degrees Farenheit. 4. Straps may be washed with soap and water, but do not moisten the self-adhesive portion. 5. For ink or hard to remove spots use a scouring cleanser which contains chlorine.  Rinse the splint thoroughly after using chlorine cleanser. 6.

## 2015-03-18 ENCOUNTER — Telehealth: Payer: Self-pay | Admitting: Adult Health

## 2015-03-18 ENCOUNTER — Encounter (HOSPITAL_COMMUNITY): Payer: Self-pay | Admitting: Occupational Therapy

## 2015-03-18 ENCOUNTER — Encounter (HOSPITAL_COMMUNITY): Payer: Self-pay | Admitting: Physical Therapy

## 2015-03-18 DIAGNOSIS — G4700 Insomnia, unspecified: Secondary | ICD-10-CM

## 2015-03-18 NOTE — Telephone Encounter (Signed)
Pt seen 5.24.16 by TP and ONO on room air ordered 6.3.16 ONO results received  Per TP: +desats, begin O2 at 2lpm at bedtime.  Also needs split night and ov w/ RA.  Thanks.  Called spoke with patient and discussed ONO results as stated above.  Pt voiced her understanding on the ONO but reported that she does not want to move forward with the sleep study.  Explained to pt that it is just 1 night, for the purposes of the test but pt still declined.  She is however, open to a home sleep study.  Tammy please advise, thank you. *order placed for new O2 start thru [facility].

## 2015-03-19 ENCOUNTER — Telehealth: Payer: Self-pay | Admitting: Adult Health

## 2015-03-19 ENCOUNTER — Ambulatory Visit (INDEPENDENT_AMBULATORY_CARE_PROVIDER_SITE_OTHER): Payer: BLUE CROSS/BLUE SHIELD | Admitting: Family Medicine

## 2015-03-19 ENCOUNTER — Encounter: Payer: Self-pay | Admitting: Family Medicine

## 2015-03-19 VITALS — BP 100/60 | Ht <= 58 in

## 2015-03-19 DIAGNOSIS — E781 Pure hyperglyceridemia: Secondary | ICD-10-CM

## 2015-03-19 DIAGNOSIS — D6852 Prothrombin gene mutation: Secondary | ICD-10-CM | POA: Diagnosis not present

## 2015-03-19 DIAGNOSIS — R7309 Other abnormal glucose: Secondary | ICD-10-CM

## 2015-03-19 DIAGNOSIS — J9611 Chronic respiratory failure with hypoxia: Secondary | ICD-10-CM

## 2015-03-19 DIAGNOSIS — Z79899 Other long term (current) drug therapy: Secondary | ICD-10-CM

## 2015-03-19 DIAGNOSIS — I749 Embolism and thrombosis of unspecified artery: Secondary | ICD-10-CM

## 2015-03-19 DIAGNOSIS — Z13220 Encounter for screening for lipoid disorders: Secondary | ICD-10-CM

## 2015-03-19 DIAGNOSIS — D6859 Other primary thrombophilia: Secondary | ICD-10-CM

## 2015-03-19 DIAGNOSIS — J9610 Chronic respiratory failure, unspecified whether with hypoxia or hypercapnia: Secondary | ICD-10-CM | POA: Insufficient documentation

## 2015-03-19 DIAGNOSIS — G4730 Sleep apnea, unspecified: Secondary | ICD-10-CM | POA: Diagnosis not present

## 2015-03-19 DIAGNOSIS — Z79891 Long term (current) use of opiate analgesic: Secondary | ICD-10-CM | POA: Diagnosis not present

## 2015-03-19 DIAGNOSIS — Z7189 Other specified counseling: Secondary | ICD-10-CM | POA: Diagnosis not present

## 2015-03-19 DIAGNOSIS — G8929 Other chronic pain: Secondary | ICD-10-CM

## 2015-03-19 DIAGNOSIS — R7303 Prediabetes: Secondary | ICD-10-CM

## 2015-03-19 MED ORDER — OXYCODONE-ACETAMINOPHEN 5-325 MG PO TABS: 1.0000 | ORAL_TABLET | Freq: Three times a day (TID) | ORAL | Status: DC | PRN

## 2015-03-19 MED ORDER — TRAZODONE HCL 100 MG PO TABS: ORAL_TABLET | ORAL | Status: DC

## 2015-03-19 MED ORDER — HYDROCORTISONE 2.5 % EX CREA
TOPICAL_CREAM | Freq: Two times a day (BID) | CUTANEOUS | Status: DC
Start: 2015-03-19 — End: 2015-03-19

## 2015-03-19 MED ORDER — HYDROCORTISONE 2.5 % EX CREA
TOPICAL_CREAM | Freq: Two times a day (BID) | CUTANEOUS | Status: DC
Start: 2015-03-19 — End: 2020-04-02

## 2015-03-19 NOTE — Telephone Encounter (Signed)
Called spoke with Melissa Insomnia nor Reactive Airway Disease for the new O2 start (per 6.9.16 phone note) - must be a chronic disease diagnosis.  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  Per Lenna Sciara, this should be sufficient per Medicare guidelines.  Chronic Resp Failure added to problem list per TP Nothing further needed; will sign off.

## 2015-03-19 NOTE — Telephone Encounter (Signed)
Start Oxygen as discussed Can discuss sleep study  Discuss at ov on return with Dr. Elsworth Soho  In 3 weeks

## 2015-03-19 NOTE — Patient Instructions (Signed)
I recommend psychiatry and couseling to help with your nerves- you refused help today  If you decide different please call us.

## 2015-03-19 NOTE — Progress Notes (Signed)
   Subjective:    Patient ID: [name], female    DOB: Sep 19, 1966, 49 y.o.   MRN: [id number]  HPI This patient was seen today for chronic pain  The medication list was reviewed and updated.   -Compliance with pain medication: yes  The patient was advised the importance of maintaining medication and not using illegal substances with these.  Refills needed: yes  The patient was educated that we can provide 3 monthly scripts for their medication, it is their responsibility to follow the instructions.  Side effects or complications from medications: none  Patient is aware that pain medications are meant to minimize the severity of the pain to allow their pain levels to improve to allow for better function. They are aware of that pain medications cannot totally remove their pain.  Due for UDT ( at least once per year) : yes  Patient was seen at [facility] recently and she wants the results of the lab work they ordered on her.  Patient has a rash a rash on her face. This has been present for several days. Dryness also noted.  Patient would like to discuss the medications she is taking for her depression also.    Greater than 25 minutes spent with patient discussing multiple issues.  Review of Systems She denies shortness of breath she does relate some sleepiness during the day she denies abusing her pain medicines. She states currently not taking any type of nerve pills. States her moods are doing okay.    Objective:   Physical Exam Her lungs are clear hearts regular she does have contractions related to her underlying neurologic disability       Assessment & Plan:  1. Encounter for chronic pain management This patient has chronic back pain as well as painful neuropathy is been on narcotics for years. We discussed pain management contract discussed safety of taking pain medicines discuss options including coming off of pain medicines if she would like to. Patient unable to give  urination unable to transfer out of wheelchair will do oral swab today. - Drug Screen + Alcohol, Oral  2. Primary hypercoagulable state Patient has factor V Leiden, her sons should also be checked it was encouraged for her to let them know to get this testing completed  3. Prediabetes A1c under decent control patient was encouraged watch starches in the diet. - Hemoglobin A1c  4. Sleep apnea This patient refuses to do a sleep study at the hospital do a home sleep study she is at high risk for sleep apnea. - Ambulatory referral to Sleep Studies  5. Hypertriglyceridemia Has history hyperlipidemia and elevated triglycerides check lipid profile. - Lipid panel  6. Screening for lipid disorders See above - Lipid panel  7. High risk medication use Because of her diarrhetic use and blood pressure medicine check metabolic 7 - Basic metabolic panel  Trazodone was increased to 100 mg tablet take 2 in the evening to help her with her sleep. She was encouraged not to take naps during the day.  Patient to follow-up 3 months

## 2015-03-19 NOTE — Telephone Encounter (Signed)
Melissa calling again about this,needs to speak to nurse before the day is over, pt is about out of 02 and will need for the weekend.Hillery Hunter

## 2015-03-20 LAB — BASIC METABOLIC PANEL
BUN / CREAT RATIO: 9 (ref 9–23)
BUN: 8 mg/dL (ref 6–24)
CHLORIDE: 100 mmol/L (ref 97–108)
CO2: 28 mmol/L (ref 18–29)
Calcium: 9.4 mg/dL (ref 8.7–10.2)
Creatinine, Ser: 0.85 mg/dL (ref 0.57–1.00)
GFR calc Af Amer: 93 mL/min/{1.73_m2} (ref >59)
GFR, EST NON AFRICAN AMERICAN: 81 mL/min/{1.73_m2} (ref >59)
Glucose: 101 mg/dL — ABNORMAL HIGH (ref 65–99)
POTASSIUM: 4.4 mmol/L (ref 3.5–5.2)
Sodium: 143 mmol/L (ref 134–144)

## 2015-03-20 LAB — LIPID PANEL
Chol/HDL Ratio: 3.9 ratio units (ref 0.0–4.4)
Cholesterol, Total: 211 mg/dL — ABNORMAL HIGH (ref 100–199)
HDL: 54 mg/dL (ref >39)
LDL Calculated: 112 mg/dL — ABNORMAL HIGH (ref 0–99)
Triglycerides: 225 mg/dL — ABNORMAL HIGH (ref 0–149)
VLDL Cholesterol Cal: 45 mg/dL — ABNORMAL HIGH (ref 5–40)

## 2015-03-20 LAB — HEMOGLOBIN A1C
Est. average glucose Bld gHb Est-mCnc: 123 mg/dL
HEMOGLOBIN A1C: 5.9 % — AB (ref 4.8–5.6)

## 2015-03-24 ENCOUNTER — Ambulatory Visit (HOSPITAL_COMMUNITY): Payer: BLUE CROSS/BLUE SHIELD | Admitting: Physical Therapy

## 2015-03-24 ENCOUNTER — Ambulatory Visit (HOSPITAL_COMMUNITY): Payer: BLUE CROSS/BLUE SHIELD

## 2015-03-24 ENCOUNTER — Encounter (HOSPITAL_COMMUNITY): Payer: Self-pay

## 2015-03-24 ENCOUNTER — Encounter (HOSPITAL_COMMUNITY): Payer: Self-pay | Admitting: Physical Therapy

## 2015-03-24 DIAGNOSIS — R270 Ataxia, unspecified: Secondary | ICD-10-CM | POA: Diagnosis not present

## 2015-03-24 DIAGNOSIS — R279 Unspecified lack of coordination: Secondary | ICD-10-CM

## 2015-03-24 DIAGNOSIS — M79642 Pain in left hand: Secondary | ICD-10-CM

## 2015-03-24 DIAGNOSIS — R29898 Other symptoms and signs involving the musculoskeletal system: Secondary | ICD-10-CM | POA: Diagnosis not present

## 2015-03-24 DIAGNOSIS — G709 Myoneural disorder, unspecified: Secondary | ICD-10-CM

## 2015-03-24 DIAGNOSIS — M25642 Stiffness of left hand, not elsewhere classified: Secondary | ICD-10-CM

## 2015-03-24 DIAGNOSIS — R269 Unspecified abnormalities of gait and mobility: Secondary | ICD-10-CM

## 2015-03-24 DIAGNOSIS — R293 Abnormal posture: Secondary | ICD-10-CM | POA: Diagnosis not present

## 2015-03-24 DIAGNOSIS — M6281 Muscle weakness (generalized): Secondary | ICD-10-CM

## 2015-03-24 DIAGNOSIS — Z7409 Other reduced mobility: Secondary | ICD-10-CM

## 2015-03-24 DIAGNOSIS — M6289 Other specified disorders of muscle: Secondary | ICD-10-CM | POA: Diagnosis not present

## 2015-03-24 NOTE — Therapy (Signed)
[place], [place], [address] Phone: [phone]   Fax:  [phone]  Occupational Therapy Treatment  Patient Details  Name: [name] MRN: [id number] Date of Birth: [birth year] Referring Provider:  Kathyrn Drown, MD  Encounter Date: 03/24/2015      OT End of Session - 03/24/15 1533    Visit Number 3   Number of Visits 16   Date for OT Re-Evaluation 05/10/15  mini reassessment: 04/08/15   Authorization Type Medicare A   Authorization Time Period before 10th visit   Authorization - Visit Number 3   Authorization - Number of Visits 10   OT Start Time 6270   OT Stop Time 1430   OT Time Calculation (min) 41 min   Activity Tolerance Patient tolerated treatment well   Behavior During Therapy [facility] for tasks assessed/performed      Past Medical History  Diagnosis Date  . Anxiety   . Depression   . COPD (chronic obstructive pulmonary disease)   . MS (multiple sclerosis)   . Fibromyalgia   . Impaired fasting glucose   . 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 Eating disorder   . Chronic low back pain   . PONV (postoperative nausea and vomiting)   . Chronic bronchitis     "yearly" (02/11/2013)  . Borderline diabetes   . GERD (gastroesophageal reflux disease)   . Migraine     "I use to" (02/11/2013)  . Arthritis     "hands & feet" (02/11/2013)  . Peripheral neuropathy     [name] 02/11/2013  . B12 deficiency anemia     [name] 02/11/2013  . Chronic pain syndrome     [name] 02/11/2013  . UTI (lower urinary tract infection) 02/11/2013    [name] 02/11/2013  . Chronic edema     BLE/notes 02/11/2013  . Ataxia   . Peripheral neuropathy   . Muscle weakness of lower extremity     bilateral  . Hypertension     Past Surgical History  Procedure Laterality Date  . Abdominal hysterectomy  ~ 1987; ~ 2004    "woodward; ferguson" (02/11/2013)  . Cesarean section  3500; 1984; 1986  . Anterior  cervical decomp/discectomy fusion  2009; 2011  . Tonsillectomy  1990's?  . Cataract extraction w/phaco  06/20/2011    Procedure: CATARACT EXTRACTION PHACO AND INTRAOCULAR LENS PLACEMENT (IOC);  Surgeon: [name] T. Gershon Crane;  [facility]: [facility];  Service: Ophthalmology;  Laterality: Left;  CDE 1.81  . Cataract extraction w/phaco  07/04/2011    Procedure: CATARACT EXTRACTION PHACO AND INTRAOCULAR LENS PLACEMENT (IOC);  Surgeon: [name] T. Gershon Crane;  Location: [facility];  Service: Ophthalmology;  Laterality: Right;  CDE: 1.76  . Yag laser application Right 9/38/1829    Procedure: YAG LASER APPLICATION;  Surgeon: [name] T. Gershon Crane, MD;  Location: [facility];  Service: Ophthalmology;  Laterality: Right;  . Yag laser application Left 9/37/1696    Procedure: YAG LASER APPLICATION;  Surgeon: [name] T. Gershon Crane, MD;  Location: [facility];  Service: Ophthalmology;  Laterality: Left;  . Appendectomy    . Laparotomy N/A 09/27/2014    Procedure: Exploratory Laparotomy, Biopsy of Perforated Gastric Ulcer, Closure with omental patch;  Surgeon: [name], MD;  Location: [facility];  Service: General;  Laterality: N/A;  . Embolectomy Left 09/27/2014    Procedure: Left Brachial, Radial,Ulnar Embolectomy with patch angioplasty left brachial, radial and ulnar artery;  Surgeon: [name], MD;  Location: [facility]  OR;  Service: Vascular;  Laterality: Left;  . Embolectomy Left 09/27/2014    Procedure: Left Radial, Brachial, and Ulnar Thrombectomy; Left Brachial to Radial Bypass Graft using Greater Saphenous vein graft from Left Thigh; Left Saphenous Vein Harvest; Intraoperative Arteriogram; Intra-arterial administration of TPA;  Surgeon: [name], MD;  Location: [facility] OR;  Service: Vascular;  Laterality: Left;  . Tracheostomy N/A december 2015    There were no vitals filed for this visit.  Visit Diagnosis:  Lack of coordination  Stiffness of hand joint, left  Hand muscle weakness  Hand joint pain, left      Subjective Assessment -  03/24/15 1537    Currently in Pain? Yes   Pain Score 5    Pain Location Leg   Pain Orientation Right;Left   Multiple Pain Sites Yes   Pain Score 5   Pain Location Foot   Pain Orientation Right;Left   Pain Score 5   Pain Location Hand   Pain Orientation Right;Left            [facility] OT Assessment - 03/24/15 1408    Assessment   Diagnosis left hand spaticity   Precautions   Precautions Fall                  OT Treatments/Exercises (OP) - 03/24/15 1408    Exercises   Exercises Hand;Wrist   Wrist Exercises   Wrist Flexion PROM;5 reps   Wrist Extension PROM;5 reps   Additional Wrist Exercises   Hand Gripper with Large Beads 6/6 beads with gripper set at 35#   Hand Gripper with Medium Beads 6 beads with gripper set at 35#, 7 beads with gripper set at 29#   Hand Gripper with Small Beads 9 beads with gripper at set @ 20#   Hand Exercises   Other Hand Exercises Pt utilized yellow resisitve clothespin to pick up sponges with Left hand and place in container on table top. Mod-max difficulty with increased time.    Other Hand Exercises Composite left digit extension with prolonged stretch using pillow on table top.    Manual Therapy   Manual Therapy Passive ROM   Passive ROM PROM completed to Left hand digits to decrease spaticity and increase functional use during daily tasks.                 OT Education - 03/24/15 1533    Education provided Yes   Education Details Patient was given print out of OT evaluation.   Person(s) Educated Patient   Methods Explanation;Handout   Comprehension Verbalized understanding          OT Short Term Goals - 03/16/15 1513    OT SHORT TERM GOAL #1   Title Patient will be educated and independent with HEP.   Status On-going   OT SHORT TERM GOAL #2   Title Patient will increase AROM of left hand by 5 degrees to increase ability to use left hand during daily tasks.    Status On-going   OT SHORT TERM GOAL #3   Title  Therapist will fabricate finger extension splint to defer the chance of progressing spaticity.    Status On-going   OT SHORT TERM GOAL #4   Title Patient will increase coordination in Bil hands by decreasing completion time of  9 hole peg test by 10 seconds.    Status On-going   OT SHORT TERM GOAL #5   Title Patient will increase grip strength by 5# and pinch strength  by 3#  of left hand to increase ability to use left hand as an active assist during daily tasks.    Status On-going           OT Long Term Goals - 03/16/15 1513    OT LONG TERM GOAL #1   Title Patient will return to highest level of independence with all daily activities.    Status On-going   OT LONG TERM GOAL #2   Title Patient will increase AROM of left hand by 10 degrees.   Status On-going   OT LONG TERM GOAL #3   Title Patient will increase PROM of left hand to [facility] to decrease the progression of spaticity   Status On-going   OT LONG TERM GOAL #4   Title Patient will increase grip strength by 10# and pinch strength by 5# in LUE.   Status On-going   OT LONG TERM GOAL #5   Title Patient will increase Bil coordination by decreasing completion time of 9 hole peg test by 30 seconds.    Status On-going               Plan - 03/24/15 1533    Clinical Impression Statement A: Pt states that she is unable to wear splint as it causes pain in her fingers. Pt forgot to bring splint this date and therapist reminded her to bring it next time for [place] to look at. Dupuytrens splint may be causing pain due to leaving first and 2nd MCP open for active movement and it's putting a lot of strain on tendons. maybe have to change to a resting hand splint with fingers extended.    Plan P: See Clinical impression statement. Assess splint if patient has brought it. Cont with passive stretching to left hand, coordination task with tweezers. Weightbearing while flattening yellow theraputty.         Problem List Patient Active  Problem List   Diagnosis Date Noted  . Primary hypercoagulable state 03/19/2015  . Chronic respiratory failure 03/19/2015  . Insomnia 02/02/2015  . MRSA pneumonia 10/11/2014  . Abdominal abscess   . Candidemia   . Screen for STD (sexually transmitted disease)   . 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 Perforated gastric ulcer 09/27/2014  . Prediabetes 07/23/2014  . Unspecified hereditary and idiopathic peripheral neuropathy 04/15/2014  . CVA (cerebral infarction) 04/02/2014  . Hypertriglyceridemia 12/11/2013  . Stiffness of joint, not elsewhere classified, ankle and foot 11/12/2013  . Lack of coordination 11/03/2013  . Pernicious anemia 09/12/2013  . Elevated transaminase level 09/12/2013  . Ataxia 09/09/2013  . Difficulty in walking(719.7) 09/09/2013  . Weakness of both legs 09/09/2013  . Generalized weakness 02/11/2013  . Tobacco abuse 01/14/2013  . Pedal edema 01/13/2013  . Chronic pain syndrome 12/30/2012  . Reactive airway disease 12/30/2012    Ailene Ravel, OTR/L,CBIS  [phone]  03/24/2015, 3:44 PM  [place] [address] [place], [place], [address] Phone: [phone]   Fax:  [phone]

## 2015-03-24 NOTE — Therapy (Signed)
[place], [place], [address] Phone: [phone]   Fax:  [phone]  Speech Language Pathology Evaluation  Patient Details  Name: [name] MRN: [id number] Date of Birth: 1966-06-17 Referring Provider:  Kathyrn Drown, MD  Encounter Date: 03/11/2015      End of Session - 03/11/15 1339    Visit Number 1   Number of Visits 1   Date for SLP Re-Evaluation 05/10/15   Authorization Type Medicare   Authorization Time Period 03/11/2015-05/10/2015   SLP Start Time 1430   SLP Stop Time  1515   SLP Time Calculation (min) 45 min   Activity Tolerance Patient tolerated treatment well      Past Medical History  Diagnosis Date  . Anxiety   . Depression   . COPD (chronic obstructive pulmonary disease)   . MS (multiple sclerosis)   . Fibromyalgia   . Impaired fasting glucose   . 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 Eating disorder   . Chronic low back pain   . PONV (postoperative nausea and vomiting)   . Chronic bronchitis     "yearly" (02/11/2013)  . Borderline diabetes   . GERD (gastroesophageal reflux disease)   . Migraine     "I use to" (02/11/2013)  . Arthritis     "hands & feet" (02/11/2013)  . Peripheral neuropathy     [name] 02/11/2013  . B12 deficiency anemia     [name] 02/11/2013  . Chronic pain syndrome     [name] 02/11/2013  . UTI (lower urinary tract infection) 02/11/2013    [name] 02/11/2013  . Chronic edema     BLE/notes 02/11/2013  . Ataxia   . Peripheral neuropathy   . Muscle weakness of lower extremity     bilateral  . Hypertension     Past Surgical History  Procedure Laterality Date  . Abdominal hysterectomy  ~ 1987; ~ 2004    "woodward; ferguson" (02/11/2013)  . Cesarean section  6579; 1984; 1986  . Anterior cervical decomp/discectomy fusion  2009; 2011  . Tonsillectomy  1990's?  . Cataract extraction w/phaco  06/20/2011    Procedure: CATARACT EXTRACTION PHACO AND  INTRAOCULAR LENS PLACEMENT (IOC);  Surgeon: [name] T. Gershon Crane;  [facility];  Service: Ophthalmology;  Laterality: Left;  CDE 1.81  . Cataract extraction w/phaco  07/04/2011    Procedure: CATARACT EXTRACTION PHACO AND INTRAOCULAR LENS PLACEMENT (IOC);  Surgeon: [name] T. Gershon Crane;  [facility];  Service: Ophthalmology;  Laterality: Right;  CDE: 1.76  . Yag laser application Right 0/38/3338    Procedure: YAG LASER APPLICATION;  Surgeon: [name] T. Gershon Crane, MD;  [facility];  Service: Ophthalmology;  Laterality: Right;  . Yag laser application Left 01/05/1915    Procedure: YAG LASER APPLICATION;  Surgeon: [name] T. Gershon Crane, MD;  [facility]: [facility];  Service: Ophthalmology;  Laterality: Left;  . Appendectomy    . Laparotomy N/A 09/27/2014    Procedure: Exploratory Laparotomy, Biopsy of Perforated Gastric Ulcer, Closure with omental patch;  Surgeon: [name], MD;  Location: [facility];  Service: General;  Laterality: N/A;  . Embolectomy Left 09/27/2014    Procedure: Left Brachial, Radial,Ulnar Embolectomy with patch angioplasty left brachial, radial and ulnar artery;  Surgeon: [name], MD;  Location: [facility];  Service: Vascular;  Laterality: Left;  . Embolectomy Left 09/27/2014    Procedure: Left Radial, Brachial, and Ulnar Thrombectomy; Left Brachial to Radial Bypass Graft using Greater  Saphenous vein graft from Left Thigh; Left Saphenous Vein Harvest; Intraoperative Arteriogram; Intra-arterial administration of TPA;  Surgeon: [name], MD;  Location: [facility] OR;  Service: Vascular;  Laterality: Left;  . Tracheostomy N/A december 2015    There were no vitals filed for this visit.  Visit Diagnosis: Dysphonia - Plan: SLP plan of care cert/re-cert  Ataxic dysarthria - Plan: SLP plan of care cert/re-cert      Subjective Assessment - 03/11/15 1322    Subjective "I want to get my voice back"   Special Tests informal cog ling   Currently in Pain? No/denies            SLP Evaluation  [facility] - 03/11/15 1323    SLP Visit Information   SLP Received On 03/11/15   Onset Date 09/25/2014   Medical Diagnosis s/p respiratory failure   Subjective   Subjective "People have a hard time understanding me."   Patient/Family Stated Goal "I want to get my voice back."   Pain Assessment   Currently in Pain? No/denies   General Information   Other Pertinent Information Patient was hospitalized in mid-December for an extended stay (also to [place]). She was admitted into the hospital because of an intestinal rupture that at first was thought to be a small bowel rupture but then was found out to be a gastric small bowel ulcer with the perforation. 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 She had a trach placed which was removed at [place]. She has a history of chronic pain related to diabetic neuropathy as well as neuropathic pain in her legs and lumbar spine she is been on pain medications of oxycodone for years. The patient is also under the care of neurology at [facility]. 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 Pt with h/o bilateral globus pallidus injury "in setting of likely drug overdose" however toxicology screen was negative in June 2015. Pt with complex medical history which also includes trach and PEG in June 2014. SLP asked to evaluate and treat speech impairments.   Behavioral/Cognition WNL   Prior Functional Status   Cognitive/Linguistic Baseline Within functional limits   Type of Home Mobile home    Lives With Significant other   Cognition   Overall Cognitive Status Within Functional Limits for tasks assessed   Auditory Comprehension   Overall Auditory Comprehension Appears within functional  limits for tasks assessed   Visual Recognition/Discrimination   Discrimination Not tested   Reading Comprehension   Reading Status Not tested   Expression   Primary Mode of Expression Verbal   Verbal Expression   Overall Verbal Expression Appears within functional limits for tasks assessed   Initiation No impairment   Level of Generative/Spontaneous Verbalization Conversation   Repetition No impairment   Naming No impairment   Pragmatics No impairment   Interfering Components --  motor speech   Non-Verbal Means of Communication Not applicable   Written Expression   Dominant Hand Right   Written Expression Not tested   Oral Motor/Sensory Function   Overall Oral Motor/Sensory Function Appears within functional limits for tasks assessed   Labial ROM Within Functional Limits   Labial Strength Within Functional Limits   Labial Sensation Within Functional Limits   Labial Coordination [facility]  Lingual ROM Within Functional Limits   Lingual Symmetry Within Functional Limits   Lingual Strength Within Functional Limits   Lingual Sensation Within Functional Limits   Lingual Coordination Reduced   Facial ROM Within Functional Limits   Facial Symmetry Within Functional Limits   Facial Strength Within Functional Limits   Facial Sensation Within Functional Limits   Facial Coordination WFL   Velum Within Functional Limits   Mandible Within Functional Limits   Motor Speech   Overall Motor Speech Impaired   Respiration Impaired   Level of Impairment Phrase   Phonation Breathy;Hoarse   Resonance Within functional limits   Articulation Within functional limitis   Intelligibility Intelligibility reduced   Word 75-100% accurate   Phrase 75-100% accurate   Sentence 75-100% accurate   Conversation 75-100% accurate   Motor Planning Witnin functional limits   Effective Techniques Slow rate;Increased vocal intensity;Over-articulate   Phonation Impaired   Vocal Abuses Other (comment)  s/p  multiple trachs   Volume --  decreased vocal intensity   Pitch Appropriate   Standardized Assessments   Standardized Assessments  --  informal cog/ling assessment                SLP Short Term Goals - 03/11/15 1345    SLP SHORT TERM GOAL #1   Title Pt will increase breath support to 10 seconds on exhalation tasks.   Baseline 7   Time 4   Period Weeks   Status New   SLP SHORT TERM GOAL #2   Title Pt will implement speech intelligibility strategies with mild cues during structured sentence-level repetition and/or reading tasks as judged by clinician.   Baseline mod cues   Time 4   Period Weeks   Status New   SLP SHORT TERM GOAL #3   Title Pt will participate in ~10-minute conversational tasks with SLP only asking for clarification of spoken words 2x.   Baseline 4x   Time 4   Period Weeks   Status New          SLP Long Term Goals - 03/11/15 1349    SLP LONG TERM GOAL #1   Title Pt will improve speech intelligiblity to [facility] with use of strategies and self correction prn during conversations in home/community setting.    Baseline Min impairment   Time 8   Period Weeks   Status New          Plan - 03/11/15 1341    Clinical Impression Statement Pt presents with mild decreased speech intelligibility and naturalness of speech due to history of previous tracheotomies, decreased respiratory support, and decreased coordination of lingual structure. Recommend skilled SLP intervention in the outpatient setting to address current deficits and increase independence with communication. Pt has a desire to improve her speech as she reports that "sometimes people have a hard time understanding" her.    Speech Therapy Frequency 2x / week   Duration --  8 weeks   Treatment/Interventions Cueing hierarchy;SLP instruction and feedback;Compensatory strategies;Patient/family education   Potential to Achieve Goals Good   Potential Considerations Previous level of function   SLP Home  Exercise Plan Pt will be independent with HEP to facilitate carryover of treatment strategies and techniques at home.    Consulted and Agree with Plan of Care Patient        Problem List Patient Active Problem List   Diagnosis Date Noted  . Primary hypercoagulable state 03/19/2015  . Chronic respiratory failure 03/19/2015  . Insomnia 02/02/2015  .  MRSA pneumonia 10/11/2014  . Abdominal abscess   . Candidemia   . Screen for STD (sexually transmitted disease)   . 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 Perforated gastric ulcer 09/27/2014  . Prediabetes 07/23/2014  . Unspecified hereditary and idiopathic peripheral neuropathy 04/15/2014  . CVA (cerebral infarction) 04/02/2014  . Hypertriglyceridemia 12/11/2013  . Stiffness of joint, not elsewhere classified, ankle and foot 11/12/2013  . Lack of coordination 11/03/2013  . Pernicious anemia 09/12/2013  . Elevated transaminase level 09/12/2013  . Ataxia 09/09/2013  . Difficulty in walking(719.7) 09/09/2013  . Weakness of both legs 09/09/2013  . Generalized weakness 02/11/2013  . Tobacco abuse 01/14/2013  . Pedal edema 01/13/2013  . Chronic pain syndrome Dec 31, 2012  . Reactive airway disease 12-31-2012   G-Codes:  Functional Assessment Tool Used clinical judgement clinical judgement at 1351 on 03/11/15 by Ephraim Hamburger, CCC-SLP Functional Limitations Motor speech Motor speech at 1351 on 03/11/15 by Ephraim Hamburger, CCC-SLP Motor Speech Current Status [phone]) At least 20 percent but less than 40 percent impaired, limited or restricted CJ at 1351 on 03/11/15 by Ephraim Hamburger, CCC-SLP Motor Speech Goal Status [phone]) At least 1 percent but less than 20 percent impaired, limited or restricted CI at 1351 on 03/11/15 by Ephraim Hamburger, CCC-SLP Motor Speech Goal Status [phone])   Thank you,  Genene Churn, [place] [phone]  (late entry for 03/11/2015 visit)  [place] 03/11/2015, 3:45 PM  [place], [place], [address] Phone: [phone]   Fax:  [phone]

## 2015-03-24 NOTE — Therapy (Signed)
[place], [place], [address] Phone: [phone]   Fax:  [phone]  Physical Therapy Treatment  Patient Details  Name: [name] MRN: [id number] Date of Birth: 10/04/1966 Referring Provider:  Kathyrn Drown, MD  Encounter Date: 03/24/2015      PT End of Session - 03/24/15 1503    Visit Number 10   Number of Visits 24   Date for PT Re-Evaluation 04/01/15   Authorization Type Medicare    Authorization Time Period 02/03/15 to 04/05/15; G-code done 6th visit    Authorization - Visit Number 10   Authorization - Number of Visits 16   PT Start Time 1300   PT Stop Time 1345   PT Time Calculation (min) 45 min   Equipment Utilized During Treatment Gait belt   Activity Tolerance Patient tolerated treatment well   Behavior During Therapy [facility] for tasks assessed/performed      Past Medical History  Diagnosis Date  . Anxiety   . Depression   . COPD (chronic obstructive pulmonary disease)   . MS (multiple sclerosis)   . Fibromyalgia   . Impaired fasting glucose   . 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 Eating disorder   . Chronic low back pain   . PONV (postoperative nausea and vomiting)   . Chronic bronchitis     "yearly" (02/11/2013)  . Borderline diabetes   . GERD (gastroesophageal reflux disease)   . Migraine     "I use to" (02/11/2013)  . Arthritis     "hands & feet" (02/11/2013)  . Peripheral neuropathy     [name] 02/11/2013  . B12 deficiency anemia     [name] 02/11/2013  . Chronic pain syndrome     [name] 02/11/2013  . UTI (lower urinary tract infection) 02/11/2013    [name] 02/11/2013  . Chronic edema     BLE/notes 02/11/2013  . Ataxia   . Peripheral neuropathy   . Muscle weakness of lower extremity     bilateral  . Hypertension     Past Surgical History  Procedure Laterality Date  . Abdominal hysterectomy  ~ 1987; ~ 2004    "woodward; ferguson" (02/11/2013)  .  Cesarean section  8527; 1984; 1986  . Anterior cervical decomp/discectomy fusion  2009; 2011  . Tonsillectomy  1990's?  . Cataract extraction w/phaco  06/20/2011    Procedure: CATARACT EXTRACTION PHACO AND INTRAOCULAR LENS PLACEMENT (IOC);  Surgeon: [name] T. Gershon Crane;  [facility]: [facility];  Service: Ophthalmology;  Laterality: Left;  CDE 1.81  . Cataract extraction w/phaco  07/04/2011    Procedure: CATARACT EXTRACTION PHACO AND INTRAOCULAR LENS PLACEMENT (IOC);  Surgeon: [name] T. Gershon Crane;  Location: [facility];  Service: Ophthalmology;  Laterality: Right;  CDE: 1.76  . Yag laser application Right 7/82/4235    Procedure: YAG LASER APPLICATION;  Surgeon: [name] T. Gershon Crane, MD;  Location: [facility];  Service: Ophthalmology;  Laterality: Right;  . Yag laser application Left 3/61/4431    Procedure: YAG LASER APPLICATION;  Surgeon: [name] T. Gershon Crane, MD;  Location: [facility];  Service: Ophthalmology;  Laterality: Left;  . Appendectomy    . Laparotomy N/A 09/27/2014    Procedure: Exploratory Laparotomy, Biopsy of Perforated Gastric Ulcer, Closure with omental patch;  Surgeon: [name], MD;  Location: [place];  Service: General;  Laterality: N/A;  . Embolectomy Left 09/27/2014    Procedure: Left Brachial, Radial,Ulnar Embolectomy with patch angioplasty left brachial, radial and ulnar artery;  Surgeon: [name], MD;  [facility];  Service: Vascular;  Laterality: Left;  . Embolectomy Left 09/27/2014    Procedure: Left Radial, Brachial, and Ulnar Thrombectomy; Left Brachial to Radial Bypass Graft using Greater Saphenous vein graft from Left Thigh; Left Saphenous Vein Harvest; Intraoperative Arteriogram; Intra-arterial administration of TPA;  Surgeon: [name], MD;  Location: [facility] OR;  Service: Vascular;  Laterality: Left;  . Tracheostomy N/A december 2015    There were no vitals filed for this visit.  Visit Diagnosis:  Ataxia  Neuromuscular disease  Weakness of both legs  Proximal muscle  weakness  Impaired functional mobility and activity tolerance  Abnormality of gait  Poor posture  Lack of coordination      Subjective Assessment - 03/24/15 1501    Subjective Pt states her hands, feet and legs hurt today, 5/10   Currently in Pain? Yes   Pain Score 5                          [facility] Adult PT Treatment/Exercise - 03/24/15 1501    Transfers   Stand Pivot Transfers 4: Min assist   Ambulation/Gait   Ambulation/Gait Yes   Ambulation/Gait Assistance 4: Min guard   Ambulation/Gait Assistance Details 3 2# weights hanging on gait belt and 1# each around ankles   Ambulation Distance (Feet) 226 Feet   Assistive device Rolling walker   Knee/Hip Exercises: Stretches   Passive Hamstring Stretch 3 reps;30 seconds   Piriformis Stretch 3 reps;30 seconds   Piriformis Stretch Limitations supine manual figure 4   Gastroc Stretch 30 seconds;3 reps   Gastroc Stretch Limitations supine, also gentle mobilization through ankle eversion/inversion    Knee/Hip Exercises: Standing   Forward Lunges Both;1 set;10 reps   Forward Lunges Limitations 2 inch box    Knee/Hip Exercises: Supine   Bridges Both;2 sets;15 reps   Bridges Limitations manual facilitation for full range bridg3; 2nd set with RTB around knees for stability                  PT Short Term Goals - 03/16/15 1313    PT SHORT TERM GOAL #1   Title Patient will demonstrate a grade of at least 3+/5 in proximal musculature and core in order to promote functional stability and safe mobility skills    Status On-going   PT SHORT TERM GOAL #2   Title Patient will be able to participate in standing tasks with Min(A) from therapist and weights on limbs to reduce ataxia symptoms for at least 7 minutes at a time    Status On-going   PT SHORT TERM GOAL #3   Title Patient to experience no more than 4/10 at worst on a daily basis at rest and during mobility    Status On-going   PT SHORT TERM GOAL #4   Title  Patient will demonstrate the ability to ambulate at least 66ft x6 in body weight support system with weighted extremities and minimal fatigue, good gait mechanics throughout    Status Achieved   PT SHORT TERM GOAL #5   Title Patient will be able to perform functional transfers, including sit<->stand and stand<->pivot from wheelchair to bed with close supervision and no loss of balance    Status On-going   PT SHORT TERM GOAL #6   Title Patient will demonstrate improved posture while seated in wheelchair and will be able to state and demonstrate the importance of performing appropriate pressure  relieving techniques consistently            PT Long Term Goals - 03/16/15 1314    PT LONG TERM GOAL #1   Title Patient will be independent in safely performed advanced HEP with assistance from caregivers as needed, updated PRN    Status On-going   PT LONG TERM GOAL #2   Title Patient will demonstrate at least 4/5 to 4+/5 muscle strength in all proximal muscles, bilateral lower extremities, and core    Status On-going   PT LONG TERM GOAL #3   Title Patient will demonstrate bilateral ankle DF of at least 15 degrees in order to improve overall gait mechanics and functional mobility skills    Status On-going   PT LONG TERM GOAL #4   Title Patient will demonstrate the ability to ambulate at least 179ft with LRAD, no more than distant supervision, no loss of balance, and minimal fatigue throughout    Status On-going   PT LONG TERM GOAL #5   Title Patient will be independent in performing functional transfers, including all bed mobility skills and sit<->stand and stand pivot transfers with Mod(I) and LRAD    Status On-going   PT LONG TERM GOAL #6   Title Patient will demonstrate the ability to stand at least 15 minutes before fatiguing and with good posture and minimal loss of balance    PT LONG TERM GOAL #7   Title Patient will regularly perform weight bearing activities at home with assistance from  caregivers as needed in order to protect skin and reduce detrimental effects of being sedentary    Status Achieved               Plan - 03/24/15 1504    Clinical Impression Statement Continued focus on increasing general ROM,independence with mobility and gait mechanics.   Pt with LOB upon initial standing when ambulating, into extension posture and unable to correct without max assist.  Pt with impulsive movements at times that also throws her balance off.  Weight belt and ankle weights help decrease this.     PT Next Visit Plan Exercises for proximal musculature mat table; deep pressure massage to assist in relieving ankle tightness; functional standing tasks with therapist facilitation.  Add standing balance tasks while on slant board to facilitate forward posturing and stabiliy.         Problem List Patient Active Problem List   Diagnosis Date Noted  . Primary hypercoagulable state 03/19/2015  . Chronic respiratory failure 03/19/2015  . Insomnia 02/02/2015  . MRSA pneumonia 10/11/2014  . Abdominal abscess   . Candidemia   . Screen for STD (sexually transmitted disease)   . 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 Perforated gastric ulcer 09/27/2014  . Prediabetes 07/23/2014  . Unspecified hereditary and idiopathic peripheral neuropathy 04/15/2014  . CVA (cerebral infarction) 04/02/2014  . Hypertriglyceridemia 12/11/2013  . Stiffness of joint, not elsewhere classified, ankle and foot 11/12/2013  . Lack of coordination 11/03/2013  . Pernicious anemia 09/12/2013  . Elevated transaminase level 09/12/2013  . Ataxia 09/09/2013  . Difficulty in walking(719.7) 09/09/2013  . Weakness of both legs 09/09/2013  . Generalized weakness 02/11/2013  . Tobacco abuse 01/14/2013  . Pedal edema 01/13/2013  . Chronic pain syndrome 12/30/2012  . Reactive airway disease 12/30/2012    Teena Irani, PTA/CLT [phone]  03/24/2015, 3:10 PM  [place] [address] [place], [place], [address] Phone: [phone]   Fax:  [phone]      

## 2015-03-25 LAB — OXYCOD/OXYMOR CONF, ORAL FLUID
OXYCODONE CONFIRM: 326 ng/mL
OXYCODONE/OXYMORPH: POSITIVE — AB
OXYCODONE: POSITIVE — AB
Oxymorphone: NEGATIVE

## 2015-03-25 LAB — DRUG SCREEN + ALCOHOL, ORAL
Amphetamine: NEGATIVE
BARBITURATES: NEGATIVE
BUPRENORPHINE: NEGATIVE ng/mL
Benzodiazepines: NEGATIVE
Cocaine and Metabolites: NEGATIVE
ETHANOL, ORAL FLUID: NEGATIVE %
FENTANYL, ORAL FLUID: NEGATIVE pg/mL
Marijuana and Metabolites: NEGATIVE
Methadone: NEGATIVE
PROPOXYPHENE: NEGATIVE
Phencyclidine: NEGATIVE
Tramadol, Oral Fluid: NEGATIVE ng/mL

## 2015-03-25 LAB — OPIATES CONFIRM, ORAL FLUID: Opiates: NEGATIVE

## 2015-03-26 ENCOUNTER — Ambulatory Visit (HOSPITAL_COMMUNITY): Payer: BLUE CROSS/BLUE SHIELD

## 2015-03-26 ENCOUNTER — Encounter (HOSPITAL_COMMUNITY): Payer: Self-pay

## 2015-03-26 ENCOUNTER — Other Ambulatory Visit: Payer: Self-pay | Admitting: *Deleted

## 2015-03-26 ENCOUNTER — Telehealth: Payer: Self-pay | Admitting: Family Medicine

## 2015-03-26 ENCOUNTER — Ambulatory Visit (HOSPITAL_COMMUNITY): Payer: BLUE CROSS/BLUE SHIELD | Admitting: Occupational Therapy

## 2015-03-26 ENCOUNTER — Encounter (HOSPITAL_COMMUNITY): Payer: Self-pay | Admitting: Occupational Therapy

## 2015-03-26 DIAGNOSIS — R270 Ataxia, unspecified: Secondary | ICD-10-CM

## 2015-03-26 DIAGNOSIS — M6289 Other specified disorders of muscle: Secondary | ICD-10-CM | POA: Diagnosis not present

## 2015-03-26 DIAGNOSIS — M6281 Muscle weakness (generalized): Secondary | ICD-10-CM

## 2015-03-26 DIAGNOSIS — M25642 Stiffness of left hand, not elsewhere classified: Secondary | ICD-10-CM

## 2015-03-26 DIAGNOSIS — R29898 Other symptoms and signs involving the musculoskeletal system: Secondary | ICD-10-CM

## 2015-03-26 DIAGNOSIS — R279 Unspecified lack of coordination: Secondary | ICD-10-CM | POA: Diagnosis not present

## 2015-03-26 DIAGNOSIS — R293 Abnormal posture: Secondary | ICD-10-CM

## 2015-03-26 DIAGNOSIS — M79642 Pain in left hand: Secondary | ICD-10-CM

## 2015-03-26 DIAGNOSIS — G709 Myoneural disorder, unspecified: Secondary | ICD-10-CM

## 2015-03-26 DIAGNOSIS — R269 Unspecified abnormalities of gait and mobility: Secondary | ICD-10-CM | POA: Diagnosis not present

## 2015-03-26 DIAGNOSIS — Z7409 Other reduced mobility: Secondary | ICD-10-CM

## 2015-03-26 MED ORDER — TRAZODONE HCL 100 MG PO TABS: ORAL_TABLET | ORAL | Status: DC

## 2015-03-26 NOTE — Telephone Encounter (Signed)
Pt called to say that she never rec'd the Rx for the Trazadone and the increase in dose that is noted in her last OV note. Please send to [facility] and call pt when done   traZODone (DESYREL) 100 MG tablet, 60 tablet, Sig: Take two tablets at night

## 2015-03-26 NOTE — Therapy (Signed)
[place], [place], [address] Phone: [phone]   Fax:  [phone]  Physical Therapy Treatment  Patient Details  Name: Algie Cales MRN: [id number] Date of Birth: 1965-10-15 Referring Provider:  Kathyrn Drown, MD  Encounter Date: 03/26/2015      PT End of Session - 03/26/15 1637    Visit Number 11   Number of Visits 24   Date for PT Re-Evaluation 04/01/15   Authorization Type Medicare    Authorization Time Period 02/03/15 to 04/05/15; G-code done 6th visit    Authorization - Visit Number 11   Authorization - Number of Visits 16   PT Start Time 8144   PT Stop Time 1428   PT Time Calculation (min) 39 min   Equipment Utilized During Treatment Gait belt   Activity Tolerance Patient tolerated treatment well   Behavior During Therapy [facility] for tasks assessed/performed      Past Medical History  Diagnosis Date  . Anxiety   . Depression   . COPD (chronic obstructive pulmonary disease)   . MS (multiple sclerosis)   . Fibromyalgia   . Impaired fasting glucose   . 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 Eating disorder   . Chronic low back pain   . PONV (postoperative nausea and vomiting)   . Chronic bronchitis     "yearly" (02/11/2013)  . Borderline diabetes   . GERD (gastroesophageal reflux disease)   . Migraine     "I use to" (02/11/2013)  . Arthritis     "hands & feet" (02/11/2013)  . Peripheral neuropathy     [name] 02/11/2013  . B12 deficiency anemia     [name] 02/11/2013  . Chronic pain syndrome     [name] 02/11/2013  . UTI (lower urinary tract infection) 02/11/2013    [name] 02/11/2013  . Chronic edema     BLE/notes 02/11/2013  . Ataxia   . Peripheral neuropathy   . Muscle weakness of lower extremity     bilateral  . Hypertension     Past Surgical History  Procedure Laterality Date  . Abdominal hysterectomy  ~ 1987; ~ 2004    "woodward; ferguson" (02/11/2013)  .  Cesarean section  8185; 1984; 1986  . Anterior cervical decomp/discectomy fusion  2009; 2011  . Tonsillectomy  1990's?  . Cataract extraction w/phaco  06/20/2011    Procedure: CATARACT EXTRACTION PHACO AND INTRAOCULAR LENS PLACEMENT (IOC);  Surgeon: [name] T. Gershon Crane;  Location: [facility];  Service: Ophthalmology;  Laterality: Left;  CDE 1.81  . Cataract extraction w/phaco  07/04/2011    Procedure: CATARACT EXTRACTION PHACO AND INTRAOCULAR LENS PLACEMENT (IOC);  Surgeon: [name] T. Gershon Crane;  Location: [facility];  Service: Ophthalmology;  Laterality: Right;  CDE: 1.76  . Yag laser application Right 6/31/4970    Procedure: YAG LASER APPLICATION;  Surgeon: [name] T. Gershon Crane, MD;  Location: [facility];  Service: Ophthalmology;  Laterality: Right;  . Yag laser application Left 2/63/7858    Procedure: YAG LASER APPLICATION;  Surgeon: [name] T. Gershon Crane, MD;  Location: [facility];  Service: Ophthalmology;  Laterality: Left;  . Appendectomy    . Laparotomy N/A 09/27/2014    Procedure: Exploratory Laparotomy, Biopsy of Perforated Gastric Ulcer, Closure with omental patch;  Surgeon: [name], MD;  Location: [place];  Service: General;  Laterality: N/A;  . Embolectomy Left 09/27/2014    Procedure: Left Brachial, Radial,Ulnar Embolectomy with patch angioplasty left brachial, radial and ulnar artery;  Surgeon: [name], MD;  Location: [facility] OR;  Service: Vascular;  Laterality: Left;  . Embolectomy Left 09/27/2014    Procedure: Left Radial, Brachial, and Ulnar Thrombectomy; Left Brachial to Radial Bypass Graft using Greater Saphenous vein graft from Left Thigh; Left Saphenous Vein Harvest; Intraoperative Arteriogram; Intra-arterial administration of TPA;  Surgeon: [name], MD;  Location: [facility] OR;  Service: Vascular;  Laterality: Left;  . Tracheostomy N/A december 2015    There were no vitals filed for this visit.  Visit Diagnosis:  Lack of coordination  Impaired functional mobility and activity  tolerance  Abnormality of gait  Poor posture  Ataxia  Neuromuscular disease  Weakness of both legs  Proximal muscle weakness      Subjective Assessment - 03/26/15 1351    Subjective Pt reports overall improvements in pain management adn general mobility.    Pertinent History Patient reports that she became very ill in December of last year with a major blood clot in her arm, for which she required two surgeries. Patient states she remained ill for about 3 months; she also states that before she got sick at the end of the year, she was able to get up and walk, didn't need any help for transfers either. Patient reports that her MDs suspect that she may have MS but that she doesn't agree with them.    Currently in Pain? Yes   Pain Score 5    Pain Location Hand  Hands feet, legs                         [facility] Adult PT Treatment/Exercise - 03/26/15 1359    Transfers   Sit to Stand 4: Min assist   Stand to Sit 4: Min guard   Stand Pivot Transfers 4: Min assist   Ambulation/Gait   Ambulation/Gait Yes   Ambulation/Gait Assistance 4: Min guard  3 LOB requiring PT to correct   Ambulation/Gait Assistance Details 1# weights on ankles, and 2# on belt   Ambulation Distance (Feet) 452 Feet   Assistive device Rolling walker   Gait Comments 2 bouts with rest break between.    Knee/Hip Exercises: Stretches   Passive Hamstring Stretch 3 reps;30 seconds   Passive Hamstring Stretch Limitations supine    Piriformis Stretch 3 reps;30 seconds   Piriformis Stretch Limitations supine manual figure 4   Gastroc Stretch 30 seconds;3 reps   Gastroc Stretch Limitations supine, also gentle mobilization through ankle eversion/inversion    Knee/Hip Exercises: Supine   Bridges Both;2 sets  2x12   Bridges Limitations manual facilitation for full range bridg3; 2nd set with RTB around knees for stability                PT Education - 03/26/15 1637    Education provided No           PT Short Term Goals - 03/16/15 1313    PT SHORT TERM GOAL #1   Title Patient will demonstrate a grade of at least 3+/5 in proximal musculature and core in order to promote functional stability and safe mobility skills    Status On-going   PT SHORT TERM GOAL #2   Title Patient will be able to participate in standing tasks with Min(A) from therapist and weights on limbs to reduce ataxia symptoms for at least 7 minutes at a time    Status On-going   PT SHORT TERM GOAL #3   Title Patient to experience  no more than 4/10 at worst on a daily basis at rest and during mobility    Status On-going   PT SHORT TERM GOAL #4   Title Patient will demonstrate the ability to ambulate at least 53ft x6 in body weight support system with weighted extremities and minimal fatigue, good gait mechanics throughout    Status Achieved   PT SHORT TERM GOAL #5   Title Patient will be able to perform functional transfers, including sit<->stand and stand<->pivot from wheelchair to bed with close supervision and no loss of balance    Status On-going   PT SHORT TERM GOAL #6   Title Patient will demonstrate improved posture while seated in wheelchair and will be able to state and demonstrate the importance of performing appropriate pressure relieving techniques consistently            PT Long Term Goals - 03/16/15 1314    PT LONG TERM GOAL #1   Title Patient will be independent in safely performed advanced HEP with assistance from caregivers as needed, updated PRN    Status On-going   PT LONG TERM GOAL #2   Title Patient will demonstrate at least 4/5 to 4+/5 muscle strength in all proximal muscles, bilateral lower extremities, and core    Status On-going   PT LONG TERM GOAL #3   Title Patient will demonstrate bilateral ankle DF of at least 15 degrees in order to improve overall gait mechanics and functional mobility skills    Status On-going   PT LONG TERM GOAL #4   Title Patient will demonstrate the  ability to ambulate at least 176ft with LRAD, no more than distant supervision, no loss of balance, and minimal fatigue throughout    Status On-going   PT LONG TERM GOAL #5   Title Patient will be independent in performing functional transfers, including all bed mobility skills and sit<->stand and stand pivot transfers with Mod(I) and LRAD    Status On-going   PT LONG TERM GOAL #6   Title Patient will demonstrate the ability to stand at least 15 minutes before fatiguing and with good posture and minimal loss of balance    PT LONG TERM GOAL #7   Title Patient will regularly perform weight bearing activities at home with assistance from caregivers as needed in order to protect skin and reduce detrimental effects of being sedentary    Status Achieved               Plan - 03/26/15 1638    Clinical Impression Statement Patient maing progress toward goals, as seen by imcrease in ambulation distance today. Pt continues to fatigue quickly with mobility, and it encouraged to slow efforts and focus on mechanics as to avoid additonal LOB especially with turns. Pt with soem scissoring once tired, but able to self correct when given cues.    Pt will benefit from skilled therapeutic intervention in order to improve on the following deficits Abnormal gait;Decreased coordination;Decreased range of motion;Difficulty walking;Impaired flexibility;Improper body mechanics;Decreased endurance;Decreased safety awareness;Impaired sensation;Postural dysfunction;Decreased activity tolerance;Decreased balance;Decreased knowledge of use of DME;Pain;Decreased mobility;Decreased strength   Rehab Potential Good   Clinical Impairments Affecting Rehab Potential chronic ataxia symptoms however patient has reportedly been able to return to walking after other bouts of illness    PT Frequency 2x / week   PT Duration 8 weeks   PT Treatment/Interventions ADLs/Self Care Home Management;Gait training;Neuromuscular  re-education;Visual/perceptual remediation/compensation;Stair training;Biofeedback;Functional mobility training;Patient/family education;Passive range of motion;Cryotherapy;Therapeutic activities;Wheelchair mobility training;Electrical Stimulation;Therapeutic exercise;Manual techniques;Energy  conservation;DME Instruction;Balance training   PT Next Visit Plan Exercises for proximal musculature mat table; deep pressure massage to assist in relieving ankle tightness; functional standing tasks with therapist facilitation.  Add standing balance tasks while on slant board to facilitate forward posturing and stabiliy.    PT Home Exercise Plan No updates   Consulted and Agree with Plan of Care Patient        Problem List Patient Active Problem List   Diagnosis Date Noted  . Primary hypercoagulable state 03/19/2015  . Chronic respiratory failure 03/19/2015  . Insomnia 02/02/2015  . MRSA pneumonia 10/11/2014  . Abdominal abscess   . Candidemia   . Screen for STD (sexually transmitted disease)   . 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 Perforated gastric ulcer 09/27/2014  . Prediabetes 07/23/2014  . Unspecified hereditary and idiopathic peripheral neuropathy 04/15/2014  . CVA (cerebral infarction) 04/02/2014  . Hypertriglyceridemia 12/11/2013  . Stiffness of joint, not elsewhere classified, ankle and foot 11/12/2013  . Lack of coordination 11/03/2013  . Pernicious anemia 09/12/2013  . Elevated transaminase level 09/12/2013  . Ataxia 09/09/2013  . Difficulty in walking(719.7) 09/09/2013  . Weakness of both legs 09/09/2013  . Generalized weakness 02/11/2013  . Tobacco abuse 01/14/2013  . Pedal edema 01/13/2013  . Chronic pain syndrome 12/30/2012  . Reactive airway disease 12/30/2012    Buccola,Allan C 03/26/2015, 4:41 PM  4:42 PM  Etta Grandchild, PT, DPT [place] License # [id number]       [facility] [address] [place], [place],  [address] Phone: [phone]   Fax:  [phone]

## 2015-03-26 NOTE — Therapy (Signed)
[place], [place], [address] Phone: [phone]   Fax:  [phone]  Occupational Therapy Treatment  Patient Details  Name: [name] MRN: [id number] Date of Birth: 01-12-1966 Referring Provider:  Kathyrn Drown, MD  Encounter Date: 03/26/2015      OT End of Session - 03/26/15 1625    Visit Number 4   Number of Visits 16   Date for OT Re-Evaluation 05/10/15  mini reassessment: 04/08/15   Authorization Type Medicare A   Authorization Time Period before 10th visit   Authorization - Visit Number 4   Authorization - Number of Visits 10   OT Start Time 1310   OT Stop Time 1350   OT Time Calculation (min) 40 min   Activity Tolerance Patient tolerated treatment well   Behavior During Therapy [facility] for tasks assessed/performed      Past Medical History  Diagnosis Date  . Anxiety   . Depression   . COPD (chronic obstructive pulmonary disease)   . MS (multiple sclerosis)   . Fibromyalgia   . Impaired fasting glucose   . 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 Eating disorder   . Chronic low back pain   . PONV (postoperative nausea and vomiting)   . Chronic bronchitis     "yearly" (02/11/2013)  . Borderline diabetes   . GERD (gastroesophageal reflux disease)   . Migraine     "I use to" (02/11/2013)  . Arthritis     "hands & feet" (02/11/2013)  . Peripheral neuropathy     [name] 02/11/2013  . B12 deficiency anemia     [name] 02/11/2013  . Chronic pain syndrome     [name] 02/11/2013  . UTI (lower urinary tract infection) 02/11/2013    [name] 02/11/2013  . Chronic edema     BLE/notes 02/11/2013  . Ataxia   . Peripheral neuropathy   . Muscle weakness of lower extremity     bilateral  . Hypertension     Past Surgical History  Procedure Laterality Date  . Abdominal hysterectomy  ~ 1987; ~ 2004    "woodward; ferguson" (02/11/2013)  . Cesarean section  3557; 1984; 1986  . Anterior  cervical decomp/discectomy fusion  2009; 2011  . Tonsillectomy  1990's?  . Cataract extraction w/phaco  06/20/2011    Procedure: CATARACT EXTRACTION PHACO AND INTRAOCULAR LENS PLACEMENT (IOC);  Surgeon: [name] T. Gershon Crane;  Location: [facility];  Service: Ophthalmology;  Laterality: Left;  CDE 1.81  . Cataract extraction w/phaco  07/04/2011    Procedure: CATARACT EXTRACTION PHACO AND INTRAOCULAR LENS PLACEMENT (IOC);  Surgeon: [name] T. Gershon Crane;  Location: [facility];  Service: Ophthalmology;  Laterality: Right;  CDE: 1.76  . Yag laser application Right 12/28/252    Procedure: YAG LASER APPLICATION;  Surgeon: [name] T. Gershon Crane, MD;  Location: [facility];  Service: Ophthalmology;  Laterality: Right;  . Yag laser application Left 2/70/6237    Procedure: YAG LASER APPLICATION;  Surgeon: [name] T. Gershon Crane, MD;  Location: [facility];  Service: Ophthalmology;  Laterality: Left;  . Appendectomy    . Laparotomy N/A 09/27/2014    Procedure: Exploratory Laparotomy, Biopsy of Perforated Gastric Ulcer, Closure with omental patch;  Surgeon: [name], MD;  Location: [place];  Service: General;  Laterality: N/A;  . Embolectomy Left 09/27/2014    Procedure: Left Brachial, Radial,Ulnar Embolectomy with patch angioplasty left brachial, radial and ulnar artery;  Surgeon: [name], MD;  Location: [facility]  OR;  Service: Vascular;  Laterality: Left;  . Embolectomy Left 09/27/2014    Procedure: Left Radial, Brachial, and Ulnar Thrombectomy; Left Brachial to Radial Bypass Graft using Greater Saphenous vein graft from Left Thigh; Left Saphenous Vein Harvest; Intraoperative Arteriogram; Intra-arterial administration of TPA;  Surgeon: [name], MD;  Location: [facility] OR;  Service: Vascular;  Laterality: Left;  . Tracheostomy N/A december 2015    There were no vitals filed for this visit.  Visit Diagnosis:  Lack of coordination  Stiffness of hand joint, left  Hand muscle weakness  Hand joint pain, left      Subjective Assessment -  03/26/15 1329    Subjective  S: The brace feels like it's poking me.    Currently in Pain? Yes   Pain Score 5    Pain Location Hand  feet & legs   Pain Orientation Left   Pain Descriptors / Indicators Throbbing;Tightness            [facility] OT Assessment - 03/26/15 1540    Assessment   Diagnosis left hand spaticity   Precautions   Precautions Fall                  OT Treatments/Exercises (OP) - 03/26/15 1330    Exercises   Exercises Hand;Wrist   Wrist Exercises   Wrist Flexion PROM;10 reps   Wrist Extension PROM;10 reps   Wrist Extension Limitations     Fine Motor Coordination   Fine Motor Coordination Large Pegboard   Large Pegboard Pt completed small blue pegboard with large pegs with mod difficulty and extended time. Pt used thumb and index finger to manipulate and insert pegs into holes.    Small Pegboard Pt attempted small pegboard with and without tweezers, unable to complete   Splinting   Splinting Assessed Dupuytrens splint fabricated on 03/16/2015 due to pt reports of discomfort. Pt reports "poking" discomfort along fingers, added foam padding to surface of splint to increase comfort.    Manual Therapy   Manual Therapy Passive ROM   Passive ROM PROM completed to Left hand digits to decrease spaticity and increase functional use during daily tasks.                   OT Short Term Goals - 03/16/15 1513    OT SHORT TERM GOAL #1   Title Patient will be educated and independent with HEP.   Status On-going   OT SHORT TERM GOAL #2   Title Patient will increase AROM of left hand by 5 degrees to increase ability to use left hand during daily tasks.    Status On-going   OT SHORT TERM GOAL #3   Title Therapist will fabricate finger extension splint to defer the chance of progressing spaticity.    Status On-going   OT SHORT TERM GOAL #4   Title Patient will increase coordination in Bil hands by decreasing completion time of  9 hole peg test by 10  seconds.    Status On-going   OT SHORT TERM GOAL #5   Title Patient will increase grip strength by 5# and pinch strength by 3#  of left hand to increase ability to use left hand as an active assist during daily tasks.    Status On-going           OT Long Term Goals - 03/16/15 1513    OT LONG TERM GOAL #1   Title Patient will return to highest level of independence with  all daily activities.    Status On-going   OT LONG TERM GOAL #2   Title Patient will increase AROM of left hand by 10 degrees.   Status On-going   OT LONG TERM GOAL #3   Title Patient will increase PROM of left hand to [facility] to decrease the progression of spaticity   Status On-going   OT LONG TERM GOAL #4   Title Patient will increase grip strength by 10# and pinch strength by 5# in LUE.   Status On-going   OT LONG TERM GOAL #5   Title Patient will increase Bil coordination by decreasing completion time of 9 hole peg test by 30 seconds.    Status On-going               Plan - 03/26/15 1625    Clinical Impression Statement A: Pt brought splint this session. Assessed fit of splint-splint appears to be well-molded and appropriate in fit. When asked if pain is pressure & soreness or poking, pt replied "poking", applied foam padding to surface of splint to increase comfort. Pt unable to complete coordination task with or without tweezers using small pegs. Pt had mod difficulty with large pegs using thumb and index finger of left hand. Did not complete weightbearing due to time constraints.    Plan P: Follow-up on splint fit. Weightbearing with yellow theraputty task.         Problem List Patient Active Problem List   Diagnosis Date Noted  . Primary hypercoagulable state 03/19/2015  . Chronic respiratory failure 03/19/2015  . Insomnia 02/02/2015  . MRSA pneumonia 10/11/2014  . Abdominal abscess   . Candidemia   . Screen for STD (sexually transmitted disease)   . 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  Perforated gastric ulcer 09/27/2014  . Prediabetes 07/23/2014  . Unspecified hereditary and idiopathic peripheral neuropathy 04/15/2014  . CVA (cerebral infarction) 04/02/2014  . Hypertriglyceridemia 12/11/2013  . Stiffness of joint, not elsewhere classified, ankle and foot 11/12/2013  . Lack of coordination 11/03/2013  . Pernicious anemia 09/12/2013  . Elevated transaminase level 09/12/2013  . Ataxia 09/09/2013  . Difficulty in walking(719.7) 09/09/2013  . Weakness of both legs 09/09/2013  . Generalized weakness 02/11/2013  . Tobacco abuse 01/14/2013  . Pedal edema 01/13/2013  . Chronic pain syndrome 12/30/2012  . Reactive airway disease 12/30/2012    Guadelupe Sabin, OTR/L  [phone]  03/26/2015, 4:29 PM  [place] [address] [place], [place], [address] Phone: [phone]   Fax:  [phone]

## 2015-03-26 NOTE — Telephone Encounter (Signed)
Med resent to [place].

## 2015-03-26 NOTE — Telephone Encounter (Signed)
Called spoke with patient, discussed TP's recs as stated below Pt aware to keep upcoming appt w/ RA Nothing further needed; will sign off.

## 2015-03-30 ENCOUNTER — Ambulatory Visit (HOSPITAL_COMMUNITY): Payer: BLUE CROSS/BLUE SHIELD

## 2015-03-30 ENCOUNTER — Encounter (HOSPITAL_COMMUNITY): Payer: Self-pay

## 2015-03-30 DIAGNOSIS — R270 Ataxia, unspecified: Secondary | ICD-10-CM | POA: Diagnosis not present

## 2015-03-30 DIAGNOSIS — R293 Abnormal posture: Secondary | ICD-10-CM | POA: Diagnosis not present

## 2015-03-30 DIAGNOSIS — R269 Unspecified abnormalities of gait and mobility: Secondary | ICD-10-CM

## 2015-03-30 DIAGNOSIS — R279 Unspecified lack of coordination: Secondary | ICD-10-CM

## 2015-03-30 DIAGNOSIS — R29898 Other symptoms and signs involving the musculoskeletal system: Secondary | ICD-10-CM | POA: Diagnosis not present

## 2015-03-30 DIAGNOSIS — G709 Myoneural disorder, unspecified: Secondary | ICD-10-CM

## 2015-03-30 DIAGNOSIS — M6281 Muscle weakness (generalized): Secondary | ICD-10-CM

## 2015-03-30 DIAGNOSIS — M79642 Pain in left hand: Secondary | ICD-10-CM

## 2015-03-30 DIAGNOSIS — M6289 Other specified disorders of muscle: Secondary | ICD-10-CM | POA: Diagnosis not present

## 2015-03-30 DIAGNOSIS — M25642 Stiffness of left hand, not elsewhere classified: Secondary | ICD-10-CM

## 2015-03-30 NOTE — Therapy (Signed)
[place], [place], [address] Phone: [phone]   Fax:  [phone]  Occupational Therapy Treatment  Patient Details  Name: [name] MRN: [id number] Date of Birth: 11/21/1965 Referring Provider:  Kathyrn Drown, MD  Encounter Date: 03/30/2015      OT End of Session - 03/30/15 1559    Visit Number 5   Number of Visits 16   Date for OT Re-Evaluation 05/10/15  mini reassessment: 04/08/15   Authorization Type Medicare A   Authorization Time Period before 10th visit   Authorization - Visit Number 5   Authorization - Number of Visits 10   OT Start Time 1345   OT Stop Time 1430   OT Time Calculation (min) 45 min   Activity Tolerance Patient tolerated treatment well   Behavior During Therapy [facility] for tasks assessed/performed      Past Medical History  Diagnosis Date  . Anxiety   . Depression   . COPD (chronic obstructive pulmonary disease)   . MS (multiple sclerosis)   . Fibromyalgia   . Impaired fasting glucose   . 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 Eating disorder   . Chronic low back pain   . PONV (postoperative nausea and vomiting)   . Chronic bronchitis     "yearly" (02/11/2013)  . Borderline diabetes   . GERD (gastroesophageal reflux disease)   . Migraine     "I use to" (02/11/2013)  . Arthritis     "hands & feet" (02/11/2013)  . Peripheral neuropathy     [name] 02/11/2013  . B12 deficiency anemia     [name] 02/11/2013  . Chronic pain syndrome     [name] 02/11/2013  . UTI (lower urinary tract infection) 02/11/2013    [name] 02/11/2013  . Chronic edema     BLE/notes 02/11/2013  . Ataxia   . Peripheral neuropathy   . Muscle weakness of lower extremity     bilateral  . Hypertension     Past Surgical History  Procedure Laterality Date  . Abdominal hysterectomy  ~ 1987; ~ 2004    "woodward; ferguson" (02/11/2013)  . Cesarean section  5361; 1984; 1986  . Anterior  cervical decomp/discectomy fusion  2009; 2011  . Tonsillectomy  1990's?  . Cataract extraction w/phaco  06/20/2011    Procedure: CATARACT EXTRACTION PHACO AND INTRAOCULAR LENS PLACEMENT (IOC);  Surgeon: [name] T. Gershon Crane;  Location: [facility];  Service: Ophthalmology;  Laterality: Left;  CDE 1.81  . Cataract extraction w/phaco  07/04/2011    Procedure: CATARACT EXTRACTION PHACO AND INTRAOCULAR LENS PLACEMENT (IOC);  Surgeon: [name] T. Gershon Crane;  Location: [facility];  Service: Ophthalmology;  Laterality: Right;  CDE: 1.76  . Yag laser application Right 4/43/1540    Procedure: YAG LASER APPLICATION;  Surgeon: [name] T. Gershon Crane, MD;  Location: [facility];  Service: Ophthalmology;  Laterality: Right;  . Yag laser application Left 0/86/7619    Procedure: YAG LASER APPLICATION;  Surgeon: [name] T. Gershon Crane, MD;  Location: [facility];  Service: Ophthalmology;  Laterality: Left;  . Appendectomy    . Laparotomy N/A 09/27/2014    Procedure: Exploratory Laparotomy, Biopsy of Perforated Gastric Ulcer, Closure with omental patch;  Surgeon: [name], MD;  Location: [place];  Service: General;  Laterality: N/A;  . Embolectomy Left 09/27/2014    Procedure: Left Brachial, Radial,Ulnar Embolectomy with patch angioplasty left brachial, radial and ulnar artery;  Surgeon: [name], MD;  Location: [facility]  OR;  Service: Vascular;  Laterality: Left;  . Embolectomy Left 09/27/2014    Procedure: Left Radial, Brachial, and Ulnar Thrombectomy; Left Brachial to Radial Bypass Graft using Greater Saphenous vein graft from Left Thigh; Left Saphenous Vein Harvest; Intraoperative Arteriogram; Intra-arterial administration of TPA;  Surgeon: [name], MD;  Location: [facility] OR;  Service: Vascular;  Laterality: Left;  . Tracheostomy N/A december 2015    There were no vitals filed for this visit.  Visit Diagnosis:  Lack of coordination  Stiffness of hand joint, left  Hand muscle weakness  Hand joint pain, left      Subjective Assessment -  03/30/15 1357    Currently in Pain? Yes   Pain Score 6    Pain Location Generalized            [facility] OT Assessment - 03/30/15 1351    Assessment   Diagnosis left hand spaticity   Precautions   Precautions Fall                  OT Treatments/Exercises (OP) - 03/30/15 1351    Exercises   Exercises Hand;Wrist   Wrist Exercises   Wrist Flexion AROM;10 reps   Wrist Extension AROM;10 reps   Additional Wrist Exercises   Theraputty Flatten;Roll;Grip;Pinch;Locate Pegs   Theraputty - Flatten yellow- standing bilateral hands   Theraputty - Roll yellow- bilateral hands   Theraputty - Grip yellow -bilateral hands   Theraputty - Pinch yellow - bilateral hands   Theraputty - Locate Pegs 5 beads   Hand Exercises   MCPJ Flexion PROM;AROM;10 reps   MCPJ Extension PROM;AROM;10 reps   Other Hand Exercises Resisitive clothespins; placed/removed with bilateral hands. Yellow, red, and green used with left hand. All colors used with right hand. Increased time needed as well as set-up for left hand. mod difficulty depending on resistance.   Manual Therapy   Manual Therapy Passive ROM   Passive ROM PROM completed to Left hand digits to decrease spaticity and increase functional use during daily tasks.                   OT Short Term Goals - 03/16/15 1513    OT SHORT TERM GOAL #1   Title Patient will be educated and independent with HEP.   Status On-going   OT SHORT TERM GOAL #2   Title Patient will increase AROM of left hand by 5 degrees to increase ability to use left hand during daily tasks.    Status On-going   OT SHORT TERM GOAL #3   Title Therapist will fabricate finger extension splint to defer the chance of progressing spaticity.    Status On-going   OT SHORT TERM GOAL #4   Title Patient will increase coordination in Bil hands by decreasing completion time of  9 hole peg test by 10 seconds.    Status On-going   OT SHORT TERM GOAL #5   Title Patient will  increase grip strength by 5# and pinch strength by 3#  of left hand to increase ability to use left hand as an active assist during daily tasks.    Status On-going           OT Long Term Goals - 03/16/15 1513    OT LONG TERM GOAL #1   Title Patient will return to highest level of independence with all daily activities.    Status On-going   OT LONG TERM GOAL #2   Title Patient will increase AROM  of left hand by 10 degrees.   Status On-going   OT LONG TERM GOAL #3   Title Patient will increase PROM of left hand to [facility] to decrease the progression of spaticity   Status On-going   OT LONG TERM GOAL #4   Title Patient will increase grip strength by 10# and pinch strength by 5# in LUE.   Status On-going   OT LONG TERM GOAL #5   Title Patient will increase Bil coordination by decreasing completion time of 9 hole peg test by 30 seconds.    Status On-going               Plan - 03/30/15 1559    Clinical Impression Statement A: Pt's left hand presented with less spaticity this sesson. Pt states that the splint is more comfortable to wear now after it was modified last session. Added yellow theraputty to HEP.    Plan P: Cont with grip and pinch strengthening activities.        Problem List Patient Active Problem List   Diagnosis Date Noted  . Primary hypercoagulable state 03/19/2015  . Chronic respiratory failure 03/19/2015  . Insomnia 02/02/2015  . MRSA pneumonia 10/11/2014  . Abdominal abscess   . Candidemia   . Screen for STD (sexually transmitted disease)   . 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 Perforated gastric ulcer 09/27/2014  . Prediabetes 07/23/2014  . Unspecified hereditary and idiopathic peripheral neuropathy 04/15/2014  . CVA (cerebral infarction) 04/02/2014  . Hypertriglyceridemia 12/11/2013  . Stiffness of joint, not elsewhere classified, ankle and foot 11/12/2013  . Lack of coordination 11/03/2013  . Pernicious anemia 09/12/2013  . Elevated  transaminase level 09/12/2013  . Ataxia 09/09/2013  . Difficulty in walking(719.7) 09/09/2013  . Weakness of both legs 09/09/2013  . Generalized weakness 02/11/2013  . Tobacco abuse 01/14/2013  . Pedal edema 01/13/2013  . Chronic pain syndrome 12/30/2012  . Reactive airway disease 12/30/2012    Ailene Ravel, OTR/L,CBIS  [phone]  03/30/2015, 4:25 PM  [place] [address] [place], [place], [address] Phone: [phone]   Fax:  [phone]

## 2015-03-30 NOTE — Patient Instructions (Signed)
Home Exercises Program Theraputty Exercises  Do the following exercises 2-3 times a day using your affected hand.  1. Roll putty into a ball.  2. Make into a pancake.  3. Roll putty into a roll.  4. Pinch along log with first finger and thumb.   5. Make into a ball.  6. Roll it back into a log.   7. Pinch using thumb and side of first finger.  8. Roll into a ball, then flatten into a pancake.  9. Using your fingers, make putty into a mountain. 

## 2015-03-30 NOTE — Therapy (Signed)
[facility], [place], [address] Phone: [phone]   Fax:  [phone]  Physical Therapy Treatment  Patient Details  Name: [name] MRN: [id number] Date of Birth: 03/20/1966 Referring Provider:  Kathyrn Drown, MD  Encounter Date: 03/30/2015      PT End of Session - 03/30/15 1343    Visit Number 12   Number of Visits 24   Date for PT Re-Evaluation 04/01/15   Authorization Type Medicare    Authorization Time Period 02/03/15 to 04/05/15; G-code done 6th visit    Authorization - Visit Number 12   Authorization - Number of Visits 16   PT Start Time 1306   PT Stop Time 1344   PT Time Calculation (min) 38 min   Equipment Utilized During Treatment Gait belt   Activity Tolerance Patient tolerated treatment well   Behavior During Therapy [facility] for tasks assessed/performed      Past Medical History  Diagnosis Date  . Anxiety   . Depression   . COPD (chronic obstructive pulmonary disease)   . MS (multiple sclerosis)   . Fibromyalgia   . Impaired fasting glucose   . 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 Eating disorder   . Chronic low back pain   . PONV (postoperative nausea and vomiting)   . Chronic bronchitis     "yearly" (02/11/2013)  . Borderline diabetes   . GERD (gastroesophageal reflux disease)   . Migraine     "I use to" (02/11/2013)  . Arthritis     "hands & feet" (02/11/2013)  . Peripheral neuropathy     [name] 02/11/2013  . B12 deficiency anemia     [name] 02/11/2013  . Chronic pain syndrome     [name] 02/11/2013  . UTI (lower urinary tract infection) 02/11/2013    [name] 02/11/2013  . Chronic edema     BLE/notes 02/11/2013  . Ataxia   . Peripheral neuropathy   . Muscle weakness of lower extremity     bilateral  . Hypertension     Past Surgical History  Procedure Laterality Date  . Abdominal hysterectomy  ~ 1987; ~ 2004    "woodward; ferguson" (02/11/2013)  .  Cesarean section  2353; 1984; 1986  . Anterior cervical decomp/discectomy fusion  2009; 2011  . Tonsillectomy  1990's?  . Cataract extraction w/phaco  06/20/2011    Procedure: CATARACT EXTRACTION PHACO AND INTRAOCULAR LENS PLACEMENT (IOC);  Surgeon: [name] T. Gershon Crane;  Location: [facility];  Service: Ophthalmology;  Laterality: Left;  CDE 1.81  . Cataract extraction w/phaco  07/04/2011    Procedure: CATARACT EXTRACTION PHACO AND INTRAOCULAR LENS PLACEMENT (IOC);  Surgeon: [name] T. Gershon Crane;  Location: [facility];  Service: Ophthalmology;  Laterality: Right;  CDE: 1.76  . Yag laser application Right 03/22/4314    Procedure: YAG LASER APPLICATION;  Surgeon: [name] T. Gershon Crane, MD;  Location: [facility];  Service: Ophthalmology;  Laterality: Right;  . Yag laser application Left 4/00/8676    Procedure: YAG LASER APPLICATION;  Surgeon: [name] T. Gershon Crane, MD;  Location: [facility];  Service: Ophthalmology;  Laterality: Left;  . Appendectomy    . Laparotomy N/A 09/27/2014    Procedure: Exploratory Laparotomy, Biopsy of Perforated Gastric Ulcer, Closure with omental patch;  Surgeon: [name], MD;  Location: [place];  Service: General;  Laterality: N/A;  . Embolectomy Left 09/27/2014    Procedure: Left Brachial, Radial,Ulnar Embolectomy with patch angioplasty left brachial, radial and ulnar artery;  Surgeon: [name], MD;  Location: [facility] OR;  Service: Vascular;  Laterality: Left;  . Embolectomy Left 09/27/2014    Procedure: Left Radial, Brachial, and Ulnar Thrombectomy; Left Brachial to Radial Bypass Graft using Greater Saphenous vein graft from Left Thigh; Left Saphenous Vein Harvest; Intraoperative Arteriogram; Intra-arterial administration of TPA;  Surgeon: [name], MD;  Location: [facility] OR;  Service: Vascular;  Laterality: Left;  . Tracheostomy N/A december 2015    There were no vitals filed for this visit.  Visit Diagnosis:  Lack of coordination  Abnormality of gait  Poor posture  Ataxia  Neuromuscular  disease  Weakness of both legs  Proximal muscle weakness      Subjective Assessment - 03/30/15 1311    Subjective Pt stated she continues to be in pain in hands, feel and Bil LE pain scale 5/10   Currently in Pain? Yes   Pain Score 5    Pain Location Generalized  Hand, feet and legs   Pain Orientation Left   Pain Descriptors / Indicators Throbbing  Throbbing and pinching                [facility] Adult PT Treatment/Exercise - 03/30/15 0001    Ambulation/Gait   Ambulation/Gait Yes   Ambulation/Gait Assistance 4: Min guard   Ambulation Distance (Feet) 520 Feet   Assistive device Rolling walker   Gait Comments 2 bouts with rest break between.    Knee/Hip Exercises: Stretches   Passive Hamstring Stretch 3 reps;30 seconds   Passive Hamstring Stretch Limitations supine    Piriformis Stretch 3 reps;30 seconds   Piriformis Stretch Limitations supine manual figure 4   Gastroc Stretch 30 seconds;3 reps   Gastroc Stretch Limitations supine, also gentle mobilization through ankle eversion/inversion    Knee/Hip Exercises: Standing   Other Standing Knee Exercises shoulder and hip wall bumps 2x 5 reps   Knee/Hip Exercises: Supine   Bridges Both;2 sets   Bridges Limitations manual facilitation for full range bridge; 2nd set with RTB around knees for stability             PT Short Term Goals - 03/30/15 1347    PT SHORT TERM GOAL #1   Title Patient will demonstrate a grade of at least 3+/5 in proximal musculature and core in order to promote functional stability and safe mobility skills    Status On-going   PT SHORT TERM GOAL #2   Title Patient will be able to participate in standing tasks with Min(A) from therapist and weights on limbs to reduce ataxia symptoms for at least 7 minutes at a time    Status On-going   PT SHORT TERM GOAL #3   Title Patient to experience no more than 4/10 at worst on a daily basis at rest and during mobility    Status On-going   PT SHORT TERM GOAL  #4   Title Patient will demonstrate the ability to ambulate at least 52ft x6 in body weight support system with weighted extremities and minimal fatigue, good gait mechanics throughout    Status Achieved   PT SHORT TERM GOAL #5   Title Patient will be able to perform functional transfers, including sit<->stand and stand<->pivot from wheelchair to bed with close supervision and no loss of balance    Status On-going   PT SHORT TERM GOAL #6   Title Patient will demonstrate improved posture while seated in wheelchair and will be able to state and demonstrate the importance of performing appropriate pressure  relieving techniques consistently    Status On-going           PT Long Term Goals - 03/30/15 1348    PT LONG TERM GOAL #1   Title Patient will be independent in safely performed advanced HEP with assistance from caregivers as needed, updated PRN    Status On-going   PT LONG TERM GOAL #2   Title Patient will demonstrate at least 4/5 to 4+/5 muscle strength in all proximal muscles, bilateral lower extremities, and core    Status On-going   PT LONG TERM GOAL #3   Title Patient will demonstrate bilateral ankle DF of at least 15 degrees in order to improve overall gait mechanics and functional mobility skills    Status On-going   PT LONG TERM GOAL #4   Title Patient will demonstrate the ability to ambulate at least 111ft with LRAD, no more than distant supervision, no loss of balance, and minimal fatigue throughout    Status On-going   PT LONG TERM GOAL #5   Title Patient will be independent in performing functional transfers, including all bed mobility skills and sit<->stand and stand pivot transfers with Mod(I) and LRAD    Status On-going   PT LONG TERM GOAL #6   Title Patient will demonstrate the ability to stand at least 15 minutes before fatiguing and with good posture and minimal loss of balance    Status On-going   PT LONG TERM GOAL #7   Title Patient will regularly perform weight  bearing activities at home with assistance from caregivers as needed in order to protect skin and reduce detrimental effects of being sedentary    Status Achieved               Plan - 03/30/15 1344    Clinical Impression Statement Pt improved mobilty in mat, no assistance required with rolling, supine to sit and min guard required with sit to standing.  Gait training required min assistance for LOB episodes with obstacles and cutting corners with cueing to improve foot mechanuics and balance.  Added standing wall bumps to improve posture to reduce posterior lean and initiate core activation to reduce posterior lean.     PT Next Visit Plan Exercises for proximal musculature mat table; deep pressure massage to assist in relieving ankle tightness; functional standing tasks with therapist facilitation.  Add standing balance tasks while on slant board to facilitate forward posturing and stabiliy.         Problem List Patient Active Problem List   Diagnosis Date Noted  . Primary hypercoagulable state 03/19/2015  . Chronic respiratory failure 03/19/2015  . Insomnia 02/02/2015  . MRSA pneumonia 10/11/2014  . Abdominal abscess   . Candidemia   . Screen for STD (sexually transmitted disease)   . 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 Perforated gastric ulcer 09/27/2014  . Prediabetes 07/23/2014  . Unspecified hereditary and idiopathic peripheral neuropathy 04/15/2014  . CVA (cerebral infarction) 04/02/2014  . Hypertriglyceridemia 12/11/2013  . Stiffness of joint, not elsewhere classified, ankle and foot 11/12/2013  . Lack of coordination 11/03/2013  . Pernicious anemia 09/12/2013  . Elevated transaminase level 09/12/2013  . Ataxia 09/09/2013  . Difficulty in walking(719.7) 09/09/2013  . Weakness of both legs 09/09/2013  . Generalized weakness 02/11/2013  . Tobacco abuse 01/14/2013  . Pedal edema 01/13/2013  . Chronic pain syndrome 12/30/2012  . Reactive airway disease 12/30/2012    [name], PTA  [name] 03/30/2015, 2:07 PM  [facility] [place], [place], [address] Phone: [phone]   Fax:  [phone]

## 2015-04-01 ENCOUNTER — Encounter (HOSPITAL_COMMUNITY): Payer: Self-pay

## 2015-04-01 ENCOUNTER — Ambulatory Visit (HOSPITAL_COMMUNITY): Payer: BLUE CROSS/BLUE SHIELD | Admitting: Occupational Therapy

## 2015-04-01 ENCOUNTER — Ambulatory Visit (HOSPITAL_COMMUNITY): Payer: BLUE CROSS/BLUE SHIELD

## 2015-04-01 DIAGNOSIS — R293 Abnormal posture: Secondary | ICD-10-CM | POA: Diagnosis not present

## 2015-04-01 DIAGNOSIS — R270 Ataxia, unspecified: Secondary | ICD-10-CM | POA: Diagnosis not present

## 2015-04-01 DIAGNOSIS — R29898 Other symptoms and signs involving the musculoskeletal system: Secondary | ICD-10-CM | POA: Diagnosis not present

## 2015-04-01 DIAGNOSIS — R279 Unspecified lack of coordination: Secondary | ICD-10-CM

## 2015-04-01 DIAGNOSIS — R269 Unspecified abnormalities of gait and mobility: Secondary | ICD-10-CM | POA: Diagnosis not present

## 2015-04-01 DIAGNOSIS — M6281 Muscle weakness (generalized): Secondary | ICD-10-CM

## 2015-04-01 DIAGNOSIS — M79642 Pain in left hand: Secondary | ICD-10-CM

## 2015-04-01 DIAGNOSIS — M25642 Stiffness of left hand, not elsewhere classified: Secondary | ICD-10-CM

## 2015-04-01 DIAGNOSIS — M6289 Other specified disorders of muscle: Secondary | ICD-10-CM | POA: Diagnosis not present

## 2015-04-01 NOTE — Therapy (Signed)
[place], [place], [address] Phone: [phone]   Fax:  [phone]  Occupational Therapy Treatment  Patient Details  Name: [name] MRN: [id number] Date of Birth: Jan 12, 1966 Referring Provider:  Kathyrn Drown, MD  Encounter Date: 04/01/2015      OT End of Session - 04/01/15 1610    Visit Number 6   Number of Visits 16   Date for OT Re-Evaluation 05/10/15  mini reassessment: 04/08/15   Authorization Type Medicare A   Authorization Time Period before 10th visit   Authorization - Visit Number 6   Authorization - Number of Visits 10   OT Start Time 1515   OT Stop Time 1558   OT Time Calculation (min) 43 min   Activity Tolerance Patient tolerated treatment well   Behavior During Therapy [facility] for tasks assessed/performed      Past Medical History  Diagnosis Date  . Anxiety   . Depression   . COPD (chronic obstructive pulmonary disease)   . MS (multiple sclerosis)   . Fibromyalgia   . Impaired fasting glucose   . 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 Eating disorder   . Chronic low back pain   . PONV (postoperative nausea and vomiting)   . Chronic bronchitis     "yearly" (02/11/2013)  . Borderline diabetes   . GERD (gastroesophageal reflux disease)   . Migraine     "I use to" (02/11/2013)  . Arthritis     "hands & feet" (02/11/2013)  . Peripheral neuropathy     [name] 02/11/2013  . B12 deficiency anemia     [name] 02/11/2013  . Chronic pain syndrome     [name] 02/11/2013  . UTI (lower urinary tract infection) 02/11/2013    [name] 02/11/2013  . Chronic edema     BLE/notes 02/11/2013  . Ataxia   . Peripheral neuropathy   . Muscle weakness of lower extremity     bilateral  . Hypertension     Past Surgical History  Procedure Laterality Date  . Abdominal hysterectomy  ~ 1987; ~ 2004    "woodward; ferguson" (02/11/2013)  . Cesarean section  8264; 1984; 1986  . Anterior  cervical decomp/discectomy fusion  2009; 2011  . Tonsillectomy  1990's?  . Cataract extraction w/phaco  06/20/2011    Procedure: CATARACT EXTRACTION PHACO AND INTRAOCULAR LENS PLACEMENT (IOC);  Surgeon: [name] T. Gershon Crane;  Location: [facility];  Service: Ophthalmology;  Laterality: Left;  CDE 1.81  . Cataract extraction w/phaco  07/04/2011    Procedure: CATARACT EXTRACTION PHACO AND INTRAOCULAR LENS PLACEMENT (IOC);  Surgeon: [name] T. Gershon Crane;  Location: [facility];  Service: Ophthalmology;  Laterality: Right;  CDE: 1.76  . Yag laser application Right 1/58/3094    Procedure: YAG LASER APPLICATION;  Surgeon: [name] T. Gershon Crane, MD;  Location: [facility];  Service: Ophthalmology;  Laterality: Right;  . Yag laser application Left 0/76/8088    Procedure: YAG LASER APPLICATION;  Surgeon: [name] T. Gershon Crane, MD;  Location: [facility];  Service: Ophthalmology;  Laterality: Left;  . Appendectomy    . Laparotomy N/A 09/27/2014    Procedure: Exploratory Laparotomy, Biopsy of Perforated Gastric Ulcer, Closure with omental patch;  Surgeon: [name], MD;  Location: [place];  Service: General;  Laterality: N/A;  . Embolectomy Left 09/27/2014    Procedure: Left Brachial, Radial,Ulnar Embolectomy with patch angioplasty left brachial, radial and ulnar artery;  Surgeon: [name], MD;  Location: [facility]  OR;  Service: Vascular;  Laterality: Left;  . Embolectomy Left 09/27/2014    Procedure: Left Radial, Brachial, and Ulnar Thrombectomy; Left Brachial to Radial Bypass Graft using Greater Saphenous vein graft from Left Thigh; Left Saphenous Vein Harvest; Intraoperative Arteriogram; Intra-arterial administration of TPA;  Surgeon: [name], MD;  Location: [facility] OR;  Service: Vascular;  Laterality: Left;  . Tracheostomy N/A december 2015    There were no vitals filed for this visit.  Visit Diagnosis:  Lack of coordination  Stiffness of hand joint, left  Hand muscle weakness  Hand joint pain, left      Subjective Assessment -  04/01/15 1518    Subjective  S: I overslept today and missed my physical therapy appointment.    Currently in Pain? Yes   Pain Score 6    Pain Location Generalized   Pain Orientation Left;Right   Pain Descriptors / Indicators Throbbing            [facility] OT Assessment - 04/01/15 1514    Assessment   Diagnosis left hand spaticity   Precautions   Precautions Fall                  OT Treatments/Exercises (OP) - 04/01/15 1519    Exercises   Exercises Hand;Wrist   Wrist Exercises   Forearm Supination PROM;5 reps;AROM;10 reps   Forearm Pronation PROM;5 reps;AROM;10 reps   Wrist Flexion PROM;5 reps;AROM;10 reps   Wrist Extension PROM;5 reps;AROM;10 reps   Wrist Radial Deviation PROM;5 reps;AROM;10 reps   Wrist Ulnar Deviation PROM;5 reps;AROM;10 reps   Additional Wrist Exercises   Hand Gripper with Large Beads 6/6 beads gripper set at 20#   Hand Gripper with Medium Beads 13/13 beads gripper set at 20#   Hand Gripper with Small Beads 17/17 beads gripper set at 20#   Hand Exercises   MCPJ Flexion PROM;AROM;10 reps   MCPJ Extension PROM;AROM;10 reps   Other Hand Exercises Pt used small PVC pipe grasped in left hand to cut into yellow flattened theraputty creating "biscuit" shapes. Pt required extended time, completing task with mod difficulty.    Fine Motor Coordination   Fine Motor Coordination Large Pegboard   Large Pegboard Pt completed designated pattern using large pegs in small blue pegboard. Pt had min difficulty this session and completed in a timely manner. Pt used lateral pinch with thumb and index finger to insert pegs.    Manual Therapy   Manual Therapy Passive ROM   Passive ROM PROM completed to Left hand digits to decrease spaticity and increase functional use during daily tasks.                   OT Short Term Goals - 03/16/15 1513    OT SHORT TERM GOAL #1   Title Patient will be educated and independent with HEP.   Status On-going   OT SHORT  TERM GOAL #2   Title Patient will increase AROM of left hand by 5 degrees to increase ability to use left hand during daily tasks.    Status On-going   OT SHORT TERM GOAL #3   Title Therapist will fabricate finger extension splint to defer the chance of progressing spaticity.    Status On-going   OT SHORT TERM GOAL #4   Title Patient will increase coordination in Bil hands by decreasing completion time of  9 hole peg test by 10 seconds.    Status On-going   OT SHORT TERM GOAL #5  Title Patient will increase grip strength by 5# and pinch strength by 3#  of left hand to increase ability to use left hand as an active assist during daily tasks.    Status On-going           OT Long Term Goals - 03/16/15 1513    OT LONG TERM GOAL #1   Title Patient will return to highest level of independence with all daily activities.    Status On-going   OT LONG TERM GOAL #2   Title Patient will increase AROM of left hand by 10 degrees.   Status On-going   OT LONG TERM GOAL #3   Title Patient will increase PROM of left hand to [facility] to decrease the progression of spaticity   Status On-going   OT LONG TERM GOAL #4   Title Patient will increase grip strength by 10# and pinch strength by 5# in LUE.   Status On-going   OT LONG TERM GOAL #5   Title Patient will increase Bil coordination by decreasing completion time of 9 hole peg test by 30 seconds.    Status On-going               Plan - 04/01/15 1610    Clinical Impression Statement A: Pt reports she has not been wearing splint today. Pt left hand presents with increased spasticity this session. Manual stretching and PROM completed to decrease spasticity and increase flexibility and joint mobility during session. Pt reports "you worked me today" at end of session.    Plan P: Continue with grip & pinch strengthening activities.         Problem List Patient Active Problem List   Diagnosis Date Noted  . Primary hypercoagulable state  03/19/2015  . Chronic respiratory failure 03/19/2015  . Insomnia 02/02/2015  . MRSA pneumonia 10/11/2014  . Abdominal abscess   . Candidemia   . Screen for STD (sexually transmitted disease)   . 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 Perforated gastric ulcer 09/27/2014  . Prediabetes 07/23/2014  . Unspecified hereditary and idiopathic peripheral neuropathy 04/15/2014  . CVA (cerebral infarction) 04/02/2014  . Hypertriglyceridemia 12/11/2013  . Stiffness of joint, not elsewhere classified, ankle and foot 11/12/2013  . Lack of coordination 11/03/2013  . Pernicious anemia 09/12/2013  . Elevated transaminase level 09/12/2013  . Ataxia 09/09/2013  . Difficulty in walking(719.7) 09/09/2013  . Weakness of both legs 09/09/2013  . Generalized weakness 02/11/2013  . Tobacco abuse 01/14/2013  . Pedal edema 01/13/2013  . Chronic pain syndrome 12/30/2012  . Reactive airway disease 12/30/2012    Guadelupe Sabin, OTR/L  [phone]  04/01/2015, 4:14 PM  [place], [place], [address] Phone: [phone]   Fax:  [phone]

## 2015-04-02 ENCOUNTER — Encounter: Payer: Self-pay | Admitting: Adult Health

## 2015-04-06 ENCOUNTER — Ambulatory Visit (HOSPITAL_COMMUNITY): Payer: BLUE CROSS/BLUE SHIELD

## 2015-04-06 ENCOUNTER — Encounter (HOSPITAL_COMMUNITY): Payer: Self-pay

## 2015-04-06 DIAGNOSIS — M6281 Muscle weakness (generalized): Secondary | ICD-10-CM

## 2015-04-06 DIAGNOSIS — R279 Unspecified lack of coordination: Secondary | ICD-10-CM

## 2015-04-06 DIAGNOSIS — M79642 Pain in left hand: Secondary | ICD-10-CM

## 2015-04-06 DIAGNOSIS — R293 Abnormal posture: Secondary | ICD-10-CM

## 2015-04-06 DIAGNOSIS — R270 Ataxia, unspecified: Secondary | ICD-10-CM | POA: Diagnosis not present

## 2015-04-06 DIAGNOSIS — M6289 Other specified disorders of muscle: Secondary | ICD-10-CM | POA: Diagnosis not present

## 2015-04-06 DIAGNOSIS — G709 Myoneural disorder, unspecified: Secondary | ICD-10-CM

## 2015-04-06 DIAGNOSIS — R29898 Other symptoms and signs involving the musculoskeletal system: Secondary | ICD-10-CM

## 2015-04-06 DIAGNOSIS — M25642 Stiffness of left hand, not elsewhere classified: Secondary | ICD-10-CM

## 2015-04-06 DIAGNOSIS — Z7409 Other reduced mobility: Secondary | ICD-10-CM

## 2015-04-06 DIAGNOSIS — R269 Unspecified abnormalities of gait and mobility: Secondary | ICD-10-CM | POA: Diagnosis not present

## 2015-04-06 NOTE — Therapy (Signed)
[place], [place], [address] Phone: [phone]   Fax:  [phone]  Occupational Therapy Treatment  Patient Details  Name: [name] MRN: [id number] Date of Birth: 1966-07-25 Referring Provider:  Kathyrn Drown, MD  Encounter Date: 04/06/2015      OT End of Session - 04/06/15 1608    Visit Number 7   Number of Visits 16   Date for OT Re-Evaluation 05/10/15  mini reassessment: 04/08/15   Authorization Type Medicare A   Authorization Time Period before 10th visit   Authorization - Visit Number 7   Authorization - Number of Visits 10   OT Start Time 1515   OT Stop Time 1600   OT Time Calculation (min) 45 min   Activity Tolerance Patient tolerated treatment well   Behavior During Therapy [facility] for tasks assessed/performed      Past Medical History  Diagnosis Date  . Anxiety   . Depression   . COPD (chronic obstructive pulmonary disease)   . MS (multiple sclerosis)   . Fibromyalgia   . Impaired fasting glucose   . 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 Eating disorder   . Chronic low back pain   . PONV (postoperative nausea and vomiting)   . Chronic bronchitis     "yearly" (02/11/2013)  . Borderline diabetes   . GERD (gastroesophageal reflux disease)   . Migraine     "I use to" (02/11/2013)  . Arthritis     "hands & feet" (02/11/2013)  . Peripheral neuropathy     [name] 02/11/2013  . B12 deficiency anemia     [name] 02/11/2013  . Chronic pain syndrome     [name] 02/11/2013  . UTI (lower urinary tract infection) 02/11/2013    [name] 02/11/2013  . Chronic edema     BLE/notes 02/11/2013  . Ataxia   . Peripheral neuropathy   . Muscle weakness of lower extremity     bilateral  . Hypertension     Past Surgical History  Procedure Laterality Date  . Abdominal hysterectomy  ~ 1987; ~ 2004    "woodward; ferguson" (02/11/2013)  . Cesarean section  0539; 1984; 1986  . Anterior  cervical decomp/discectomy fusion  2009; 2011  . Tonsillectomy  1990's?  . Cataract extraction w/phaco  06/20/2011    Procedure: CATARACT EXTRACTION PHACO AND INTRAOCULAR LENS PLACEMENT (IOC);  Surgeon: [name] T. Gershon Crane;  [facility]: [facility];  Service: Ophthalmology;  Laterality: Left;  CDE 1.81  . Cataract extraction w/phaco  07/04/2011    Procedure: CATARACT EXTRACTION PHACO AND INTRAOCULAR LENS PLACEMENT (IOC);  Surgeon: [name] T. Gershon Crane;  Location: [facility];  Service: Ophthalmology;  Laterality: Right;  CDE: 1.76  . Yag laser application Right 7/67/3419    Procedure: YAG LASER APPLICATION;  Surgeon: [name] T. Gershon Crane, MD;  Location: [facility];  Service: Ophthalmology;  Laterality: Right;  . Yag laser application Left 3/79/0240    Procedure: YAG LASER APPLICATION;  Surgeon: [name] T. Gershon Crane, MD;  Location: [facility];  Service: Ophthalmology;  Laterality: Left;  . Appendectomy    . Laparotomy N/A 09/27/2014    Procedure: Exploratory Laparotomy, Biopsy of Perforated Gastric Ulcer, Closure with omental patch;  Surgeon: [name], MD;  Location: [place];  Service: General;  Laterality: N/A;  . Embolectomy Left 09/27/2014    Procedure: Left Brachial, Radial,Ulnar Embolectomy with patch angioplasty left brachial, radial and ulnar artery;  Surgeon: [name], MD;  Location: [facility]  OR;  Service: Vascular;  Laterality: Left;  . Embolectomy Left 09/27/2014    Procedure: Left Radial, Brachial, and Ulnar Thrombectomy; Left Brachial to Radial Bypass Graft using Greater Saphenous vein graft from Left Thigh; Left Saphenous Vein Harvest; Intraoperative Arteriogram; Intra-arterial administration of TPA;  Surgeon: [name], MD;  Location: [facility] OR;  Service: Vascular;  Laterality: Left;  . Tracheostomy N/A december 2015    There were no vitals filed for this visit.  Visit Diagnosis:  Lack of coordination  Stiffness of hand joint, left  Hand muscle weakness  Hand joint pain, left      Subjective Assessment -  04/06/15 1526    Currently in Pain? Yes   Pain Score 5    Pain Location Generalized   Pain Orientation Right;Left   Pain Descriptors / Indicators Constant            [facility] OT Assessment - 04/06/15 1538    Assessment   Diagnosis left hand spaticity   Precautions   Precautions Fall                  OT Treatments/Exercises (OP) - 04/06/15 1531    Exercises   Exercises Hand;Wrist   Wrist Exercises   Forearm Supination PROM;5 reps;AROM;10 reps   Forearm Pronation PROM;5 reps;AROM;10 reps   Wrist Flexion PROM;5 reps;AROM;10 reps   Wrist Extension PROM;5 reps;AROM;10 reps   Wrist Radial Deviation PROM;5 reps;AROM;10 reps   Wrist Ulnar Deviation PROM;5 reps;AROM;10 reps   Additional Wrist Exercises   Hand Gripper with Large Beads 6/6 beads gripper set at 20# Left   Hand Gripper with Medium Beads 13/13 beads gripper set at 20# left   Hand Gripper with Small Beads 10/17 beads with gripper at 20# left   Hand Exercises   Other Hand Exercises Pt utilized yellow resistive clothespin in left hand and red resistive clothespin in right hand to pick up 22 sponges and place in container on table top. Min difficulty.    Manual Therapy   Manual Therapy Passive ROM   Passive ROM PROM completed to Left hand digits to decrease spaticity and increase functional use during daily tasks.                   OT Short Term Goals - 03/16/15 1513    OT SHORT TERM GOAL #1   Title Patient will be educated and independent with HEP.   Status On-going   OT SHORT TERM GOAL #2   Title Patient will increase AROM of left hand by 5 degrees to increase ability to use left hand during daily tasks.    Status On-going   OT SHORT TERM GOAL #3   Title Therapist will fabricate finger extension splint to defer the chance of progressing spaticity.    Status On-going   OT SHORT TERM GOAL #4   Title Patient will increase coordination in Bil hands by decreasing completion time of  9 hole peg test by  10 seconds.    Status On-going   OT SHORT TERM GOAL #5   Title Patient will increase grip strength by 5# and pinch strength by 3#  of left hand to increase ability to use left hand as an active assist during daily tasks.    Status On-going           OT Long Term Goals - 03/16/15 1513    OT LONG TERM GOAL #1   Title Patient will return to highest level of independence with  all daily activities.    Status On-going   OT LONG TERM GOAL #2   Title Patient will increase AROM of left hand by 10 degrees.   Status On-going   OT LONG TERM GOAL #3   Title Patient will increase PROM of left hand to [facility] to decrease the progression of spaticity   Status On-going   OT LONG TERM GOAL #4   Title Patient will increase grip strength by 10# and pinch strength by 5# in LUE.   Status On-going   OT LONG TERM GOAL #5   Title Patient will increase Bil coordination by decreasing completion time of 9 hole peg test by 30 seconds.    Status On-going               Plan - 04/06/15 1608    Clinical Impression Statement A: Pt's left 5th MCP presented today with some redness around the cuticle. Fingernail may fall off soon.   Plan P: Mini reassessment        Problem List Patient Active Problem List   Diagnosis Date Noted  . Primary hypercoagulable state 03/19/2015  . Chronic respiratory failure 03/19/2015  . Insomnia 02/02/2015  . MRSA pneumonia 10/11/2014  . Abdominal abscess   . Candidemia   . Screen for STD (sexually transmitted disease)   . 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 Perforated gastric ulcer 09/27/2014  . Prediabetes 07/23/2014  . Unspecified hereditary and idiopathic peripheral neuropathy 04/15/2014  . CVA (cerebral infarction) 04/02/2014  . Hypertriglyceridemia 12/11/2013  . Stiffness of joint, not elsewhere classified, ankle and foot 11/12/2013  . Lack of coordination 11/03/2013  . Pernicious anemia 09/12/2013  . Elevated transaminase level 09/12/2013  . Ataxia  09/09/2013  . Difficulty in walking(719.7) 09/09/2013  . Weakness of both legs 09/09/2013  . Generalized weakness 02/11/2013  . Tobacco abuse 01/14/2013  . Pedal edema 01/13/2013  . Chronic pain syndrome 12/30/2012  . Reactive airway disease 12/30/2012    Ailene Ravel, OTR/L,CBIS  [phone]  04/06/2015, 4:11 PM  [place] [address] [place], [place], [address] Phone: [phone]   Fax:  [phone]

## 2015-04-06 NOTE — Therapy (Signed)
[place], [place], [address] Phone: [phone]   Fax:  [phone]  Physical Therapy Treatment  Patient Details  Name: [name] MRN: [id number] Date of Birth: 1966-05-12 Referring Provider:  Kathyrn Drown, MD  Encounter Date: 04/06/2015      PT End of Session - 04/06/15 1527    Visit Number 13   Number of Visits 24   Date for PT Re-Evaluation 06/06/15   Authorization Type Medicare    Authorization Time Period 04/06/15-06/06/15; G-code done 13th visit    Authorization - Visit Number 13   Authorization - Number of Visits 23   PT Start Time 1435   PT Stop Time 1515   PT Time Calculation (min) 40 min   Equipment Utilized During Treatment Gait belt   Activity Tolerance Patient tolerated treatment well;Patient limited by fatigue   Behavior During Therapy [facility] for tasks assessed/performed      Past Medical History  Diagnosis Date  . Anxiety   . Depression   . COPD (chronic obstructive pulmonary disease)   . MS (multiple sclerosis)   . Fibromyalgia   . Impaired fasting glucose   . 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 Eating disorder   . Chronic low back pain   . PONV (postoperative nausea and vomiting)   . Chronic bronchitis     "yearly" (02/11/2013)  . Borderline diabetes   . GERD (gastroesophageal reflux disease)   . Migraine     "I use to" (02/11/2013)  . Arthritis     "hands & feet" (02/11/2013)  . Peripheral neuropathy     [name] 02/11/2013  . B12 deficiency anemia     [name] 02/11/2013  . Chronic pain syndrome     [name] 02/11/2013  . UTI (lower urinary tract infection) 02/11/2013    [name] 02/11/2013  . Chronic edema     BLE/notes 02/11/2013  . Ataxia   . Peripheral neuropathy   . Muscle weakness of lower extremity     bilateral  . Hypertension     Past Surgical History  Procedure Laterality Date  . Abdominal hysterectomy  ~ 1987; ~ 2004    "woodward;  ferguson" (02/11/2013)  . Cesarean section  3149; 1984; 1986  . Anterior cervical decomp/discectomy fusion  2009; 2011  . Tonsillectomy  1990's?  . Cataract extraction w/phaco  06/20/2011    Procedure: CATARACT EXTRACTION PHACO AND INTRAOCULAR LENS PLACEMENT (IOC);  Surgeon: [name] T. Gershon Crane;  [facility];  Service: Ophthalmology;  Laterality: Left;  CDE 1.81  . Cataract extraction w/phaco  07/04/2011    Procedure: CATARACT EXTRACTION PHACO AND INTRAOCULAR LENS PLACEMENT (IOC);  Surgeon: [name] T. Gershon Crane;  [facility]: [facility];  Service: Ophthalmology;  Laterality: Right;  CDE: 1.76  . Yag laser application Right 04/09/6377    Procedure: YAG LASER APPLICATION;  Surgeon: [name] T. Gershon Crane, MD;  Location: [facility];  Service: Ophthalmology;  Laterality: Right;  . Yag laser application Left 5/88/5027    Procedure: YAG LASER APPLICATION;  Surgeon: [name] T. Gershon Crane, MD;  Location: [facility];  Service: Ophthalmology;  Laterality: Left;  . Appendectomy    . Laparotomy N/A 09/27/2014    Procedure: Exploratory Laparotomy, Biopsy of Perforated Gastric Ulcer, Closure with omental patch;  Surgeon: [name], MD;  Location: [place];  Service: General;  Laterality: N/A;  . Embolectomy Left 09/27/2014    Procedure: Left Brachial, Radial,Ulnar Embolectomy with patch angioplasty left brachial, radial and ulnar  artery;  Surgeon: [name], MD;  Location: [facility] OR;  Service: Vascular;  Laterality: Left;  . Embolectomy Left 09/27/2014    Procedure: Left Radial, Brachial, and Ulnar Thrombectomy; Left Brachial to Radial Bypass Graft using Greater Saphenous vein graft from Left Thigh; Left Saphenous Vein Harvest; Intraoperative Arteriogram; Intra-arterial administration of TPA;  Surgeon: [name], MD;  Location: [facility] OR;  Service: Vascular;  Laterality: Left;  . Tracheostomy N/A december 2015    There were no vitals filed for this visit.  Visit Diagnosis:  Lack of coordination - Plan: PT plan of care  cert/re-cert  Proximal muscle weakness - Plan: PT plan of care cert/re-cert  Stiffness of hand joint, left - Plan: PT plan of care cert/re-cert  Impaired functional mobility and activity tolerance - Plan: PT plan of care cert/re-cert  Abnormality of gait - Plan: PT plan of care cert/re-cert  Poor posture - Plan: PT plan of care cert/re-cert  Ataxia - Plan: PT plan of care cert/re-cert  Neuromuscular disease - Plan: PT plan of care cert/re-cert  Weakness of both legs - Plan: PT plan of care cert/re-cert      Subjective Assessment - 04/06/15 1439    Subjective Pt reports pain continues to be in pan in BLE, and in hands. Pt reports bones in her feet are painful as well. She tries to continue to remain active but reports moderate difficulty.    Pertinent History Patient reports that she became very ill in December of last year with a major blood clot in her arm, for which she required two surgeries. Patient states she remained ill for about 3 months; she also states that before she got sick at the end of the year, she was able to get up and walk, didn't need any help for transfers either. Patient reports that her MDs suspect that she may have MS but that she doesn't agree with them.             [facility] PT Assessment - 04/06/15 1440    Precautions   Precautions Fall   Transfers   Sit to Stand 4: Min guard   Stand to Sit 4: Min guard   Stand Pivot Transfers 4: Min guard                     [facility] Adult PT Treatment/Exercise - 04/06/15 1440    Ambulation/Gait   Ambulation/Gait Yes   Ambulation/Gait Assistance 4: Min guard   Ambulation/Gait Assistance Details No weights today, but would benefit in future on walker frame  Adductor stretching aiding in LLE control, s scissoring.   Ambulation Distance (Feet) 452 Feet   Assistive device Rolling walker   Gait Comments 2 bouts with rest break between.   Significant mor efatigue on second lap, may have rushed rest    Knee/Hip Exercises: Stretches   Passive Hamstring Stretch 30 seconds;2 reps   Passive Hamstring Stretch Limitations seated in WC  ?HS length improving, close to 80-90 degrees   Gastroc Stretch 30 seconds;3 reps   Gastroc Stretch Limitations supine, also gentle mobilization through ankle eversion/inversion    Knee/Hip Exercises: Standing   Other Standing Knee Exercises RLE dorsiflexion  2x15 strengthening c manula assistance for end range.    Knee/Hip Exercises: Supine   Bridges Both;2 sets   Bridges Limitations manual facilitation for full range bridge; 2nd set with RTB around knees for stability   Other Supine Knee/Hip Exercises supine Adductor stretch  knee straight, 6" box +  yello ball between feet; 2x2"                PT Education - 04/06/15 1526    Education provided Yes   Education Details explained how tight adductors could be limiting Gait issues and how stretch could affect medial knees; encouraged to offer feedback PRN.    Person(s) Educated Patient   Methods Explanation   Comprehension Verbalized understanding          PT Short Term Goals - 04/06/15 1551    PT SHORT TERM GOAL #1   Title Patient will demonstrate a grade of at least 3+/5 in proximal musculature and core in order to promote functional stability and safe mobility skills    Time 6   Period Weeks   Status On-going   PT SHORT TERM GOAL #2   Title Patient will be able to participate in standing tasks with Min(A) from therapist and weights on limbs to reduce ataxia symptoms for at least 7 minutes at a time    Baseline 5/26- improved standing tolerance but not quite up to 7 minutes yet    Time 6   Period Weeks   Status Partially Met   PT SHORT TERM GOAL #3   Title Patient to experience no more than 4/10 at worst on a daily basis at rest and during mobility    Time 6   Period Weeks   Status Not Met   PT SHORT TERM GOAL #4   Title Patient will demonstrate the ability to ambulate at least 69f x6  in body weight support system with weighted extremities and minimal fatigue, good gait mechanics throughout    Baseline able to perform [id number] rest between bouts.    Time 6   Period Weeks   Status On-going   PT SHORT TERM GOAL #5   Title Patient will be able to perform functional transfers, including sit<->stand and stand<->pivot from wheelchair to bed with close supervision and no loss of balance    Baseline Pt reports continued progress with all mobility at home.    Time 6   Period Weeks   Status On-going   PT SHORT TERM GOAL #6   Title Patient will demonstrate improved posture while seated in wheelchair and will be able to state and demonstrate the importance of performing appropriate pressure relieving techniques consistently    Time 6   Period Weeks   Status Partially Met           PT Long Term Goals - 04/06/15 1555    PT LONG TERM GOAL #1   Title Patient will be independent in safely performed advanced HEP with assistance from caregivers as needed, updated PRN    Time 12   Period Weeks   Status On-going   PT LONG TERM GOAL #2   Title Patient will demonstrate at least 4/5 to 4+/5 muscle strength in all proximal muscles, bilateral lower extremities, and core    Time 12   Period Weeks   Status On-going   PT LONG TERM GOAL #3   Title Patient will demonstrate bilateral ankle DF of at least 10 degrees in order to improve overall gait mechanics and functional mobility skills    Time 12   Period Weeks   Status Revised   PT LONG TERM GOAL #4   Title Patient will demonstrate the ability to ambulate at least 10053fith LRAD, no more than distant supervision, no loss of balance, and minimal fatigue throughout  Baseline Straight plane is achieved, but needs MinA c turns.    Time 12   Period Weeks   Status Partially Met   PT LONG TERM GOAL #5   Title Patient will be independent in performing functional transfers, including all bed mobility skills and sit<->stand and stand  pivot transfers with Mod(I) and LRAD    Time 12   Period Weeks   Status On-going   PT LONG TERM GOAL #6   Title Patient will demonstrate the ability to stand at least 15 minutes before fatiguing and with good posture and minimal loss of balance    Time 12   Period Weeks   Status On-going   PT LONG TERM GOAL #7   Title Patient will regularly perform weight bearing activities at home with assistance from caregivers as needed in order to protect skin and reduce detrimental effects of being sedentary    Time 12   Period Weeks   Status Unable to assess               Plan - 04/27/2015 1544    Clinical Impression Statement Pt tolerating treatment well, demonstrating good response to stretches in regards to short-term mobility improvements. Pt demonstrating improvement toward goals as evdence by improved ambulation and transfers. Pt continues to show minimal progress in regards to balance during gait and pain management from increased tone in legs. Pt will continue to to benefit from skilled intervention to imrprove strength, ROM, activity tolerance, balance, and pain, in order to improve qulaity of line, decrease caregiver burden and restore to PLOF in mobility.    Pt will benefit from skilled therapeutic intervention in order to improve on the following deficits Abnormal gait;Decreased coordination;Decreased range of motion;Difficulty walking;Impaired flexibility;Improper body mechanics;Decreased endurance;Decreased safety awareness;Impaired sensation;Postural dysfunction;Decreased activity tolerance;Decreased balance;Decreased knowledge of use of DME;Pain;Decreased mobility;Decreased strength   Clinical Impairments Affecting Rehab Potential chronic ataxia symptoms however patient has reportedly been able to return to walking after other bouts of illness    PT Frequency 2x / week   PT Duration 8 weeks   PT Treatment/Interventions ADLs/Self Care Home Management;Gait training;Neuromuscular  re-education;Visual/perceptual remediation/compensation;Stair training;Biofeedback;Functional mobility training;Patient/family education;Passive range of motion;Cryotherapy;Therapeutic activities;Wheelchair mobility training;Electrical Stimulation;Therapeutic exercise;Manual techniques;Energy conservation;DME Instruction;Balance training   PT Next Visit Plan DC hamstrings stretches as length is looking very good. Continue to perform stretching of long adductors in supine, stretching of plantar flexors, strengthening of R dorsiflexion, and tolerance to AMB. Maintain cuff weight on walker to improve control with imbulation . Pt is good candidate for particpation in body-weight support treadmill gait training.    PT Home Exercise Plan No updates   Consulted and Agree with Plan of Care Patient          G-Codes - 04-27-15 1602    Functional Assessment Tool Used FOTO not performed this session; Clinical judgment used to assess.    Functional Limitation Mobility: Walking and moving around   Mobility: Walking and Moving Around Current Status [phone]) At least 40 percent but less than 60 percent impaired, limited or restricted   Mobility: Walking and Moving Around Goal Status [phone]) At least 20 percent but less than 40 percent impaired, limited or restricted   Mobility: Walking and Moving Around Discharge Status [phone]) At least 20 percent but less than 40 percent impaired, limited or restricted      Problem List Patient Active Problem List   Diagnosis Date Noted  . Primary hypercoagulable state 03/19/2015  . Chronic respiratory failure 03/19/2015  .  Insomnia 02/02/2015  . MRSA pneumonia 10/11/2014  . Abdominal abscess   . Candidemia   . Screen for STD (sexually transmitted disease)   . 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 Perforated gastric ulcer 09/27/2014  . Prediabetes 07/23/2014  . Unspecified hereditary and idiopathic peripheral neuropathy 04/15/2014  . CVA (cerebral infarction)  04/02/2014  . Hypertriglyceridemia 12/11/2013  . Stiffness of joint, not elsewhere classified, ankle and foot 11/12/2013  . Lack of coordination 11/03/2013  . Pernicious anemia 09/12/2013  . Elevated transaminase level 09/12/2013  . Ataxia 09/09/2013  . Difficulty in walking(719.7) 09/09/2013  . Weakness of both legs 09/09/2013  . Generalized weakness 02/11/2013  . Tobacco abuse 01/14/2013  . Pedal edema 01/13/2013  . Chronic pain syndrome 12/30/2012  . Reactive airway disease 12/30/2012    Buccola,Allan C 04/06/2015, 4:20 PM 4:21 PM  Etta Grandchild, PT, DPT [place] License # [id number]      [facility] [address] [place], [place], [address] Phone: [phone]   Fax:  [phone]

## 2015-04-08 ENCOUNTER — Encounter (HOSPITAL_COMMUNITY): Payer: Self-pay

## 2015-04-09 ENCOUNTER — Telehealth: Payer: Self-pay | Admitting: Family Medicine

## 2015-04-09 NOTE — Telephone Encounter (Signed)
FYI - pt refuses to do sleep study at hospital, her [place] does not cover home sleep testing, called & spoke to pt, explained, she still refuses, explained that if she changes her mind to let us know and we would be happy to send info and get her set up

## 2015-04-12 NOTE — Telephone Encounter (Signed)
Please let the patient know that if she does not one to do the sleep study I will have to taper off of the pain medications this is because untreated sleep apnea and taking pain medications greatly increases the risk of accidental overdose

## 2015-04-13 ENCOUNTER — Ambulatory Visit (HOSPITAL_COMMUNITY): Payer: BLUE CROSS/BLUE SHIELD | Admitting: Speech Pathology

## 2015-04-13 ENCOUNTER — Ambulatory Visit (HOSPITAL_COMMUNITY): Payer: BLUE CROSS/BLUE SHIELD | Admitting: Occupational Therapy

## 2015-04-13 ENCOUNTER — Ambulatory Visit (HOSPITAL_COMMUNITY): Payer: BLUE CROSS/BLUE SHIELD | Attending: Family Medicine

## 2015-04-13 ENCOUNTER — Encounter (HOSPITAL_COMMUNITY): Payer: Self-pay | Admitting: Occupational Therapy

## 2015-04-13 DIAGNOSIS — M25642 Stiffness of left hand, not elsewhere classified: Secondary | ICD-10-CM | POA: Diagnosis present

## 2015-04-13 DIAGNOSIS — M6281 Muscle weakness (generalized): Secondary | ICD-10-CM

## 2015-04-13 DIAGNOSIS — R490 Dysphonia: Secondary | ICD-10-CM | POA: Diagnosis present

## 2015-04-13 DIAGNOSIS — M6289 Other specified disorders of muscle: Secondary | ICD-10-CM | POA: Insufficient documentation

## 2015-04-13 DIAGNOSIS — R269 Unspecified abnormalities of gait and mobility: Secondary | ICD-10-CM | POA: Diagnosis present

## 2015-04-13 DIAGNOSIS — R279 Unspecified lack of coordination: Secondary | ICD-10-CM | POA: Diagnosis present

## 2015-04-13 DIAGNOSIS — M79642 Pain in left hand: Secondary | ICD-10-CM | POA: Diagnosis present

## 2015-04-13 DIAGNOSIS — R471 Dysarthria and anarthria: Secondary | ICD-10-CM | POA: Insufficient documentation

## 2015-04-13 DIAGNOSIS — Z7409 Other reduced mobility: Secondary | ICD-10-CM | POA: Diagnosis present

## 2015-04-13 DIAGNOSIS — R29898 Other symptoms and signs involving the musculoskeletal system: Secondary | ICD-10-CM | POA: Diagnosis present

## 2015-04-13 DIAGNOSIS — G709 Myoneural disorder, unspecified: Secondary | ICD-10-CM | POA: Diagnosis present

## 2015-04-13 DIAGNOSIS — R293 Abnormal posture: Secondary | ICD-10-CM | POA: Insufficient documentation

## 2015-04-13 DIAGNOSIS — R270 Ataxia, unspecified: Secondary | ICD-10-CM | POA: Insufficient documentation

## 2015-04-13 NOTE — Therapy (Signed)
[place], [place], [address] Phone: [phone]   Fax:  [phone]  Physical Therapy Treatment  Patient Details  Name: [name] MRN: [id number] Date of Birth: 1966-09-24 Referring Provider:  Kathyrn Drown, MD  Encounter Date: 04/13/2015      PT End of Session - 04/13/15 1838    Visit Number 14   Number of Visits 24   Date for PT Re-Evaluation 06/06/15   Authorization Type Medicare    Authorization Time Period 04/06/15-06/06/15; G-code done 13th visit    Authorization - Visit Number 14   Authorization - Number of Visits 23   PT Start Time 1435   PT Stop Time 1514   PT Time Calculation (min) 39 min   Equipment Utilized During Treatment Gait belt   Activity Tolerance Patient tolerated treatment well;Patient limited by fatigue   Behavior During Therapy [facility] for tasks assessed/performed      Past Medical History  Diagnosis Date  . Anxiety   . Depression   . COPD (chronic obstructive pulmonary disease)   . MS (multiple sclerosis)   . Fibromyalgia   . Impaired fasting glucose   . 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 Eating disorder   . Chronic low back pain   . PONV (postoperative nausea and vomiting)   . Chronic bronchitis     "yearly" (02/11/2013)  . Borderline diabetes   . GERD (gastroesophageal reflux disease)   . Migraine     "I use to" (02/11/2013)  . Arthritis     "hands & feet" (02/11/2013)  . Peripheral neuropathy     [name] 02/11/2013  . B12 deficiency anemia     [name] 02/11/2013  . Chronic pain syndrome     [name] 02/11/2013  . UTI (lower urinary tract infection) 02/11/2013    [name] 02/11/2013  . Chronic edema     BLE/notes 02/11/2013  . Ataxia   . Peripheral neuropathy   . Muscle weakness of lower extremity     bilateral  . Hypertension     Past Surgical History  Procedure Laterality Date  . Abdominal hysterectomy  ~ 1987; ~ 2004    "woodward;  ferguson" (02/11/2013)  . Cesarean section  3428; 1984; 1986  . Anterior cervical decomp/discectomy fusion  2009; 2011  . Tonsillectomy  1990's?  . Cataract extraction w/phaco  06/20/2011    Procedure: CATARACT EXTRACTION PHACO AND INTRAOCULAR LENS PLACEMENT (IOC);  Surgeon: [name] T. Gershon Crane;  Location: [facility];  Service: Ophthalmology;  Laterality: Left;  CDE 1.81  . Cataract extraction w/phaco  07/04/2011    Procedure: CATARACT EXTRACTION PHACO AND INTRAOCULAR LENS PLACEMENT (IOC);  Surgeon: [name] T. Gershon Crane;  Location: [facility];  Service: Ophthalmology;  Laterality: Right;  CDE: 1.76  . Yag laser application Right 7/68/1157    Procedure: YAG LASER APPLICATION;  Surgeon: [name] T. Gershon Crane, MD;  Location: [facility];  Service: Ophthalmology;  Laterality: Right;  . Yag laser application Left 2/62/0355    Procedure: YAG LASER APPLICATION;  Surgeon: [name] T. Gershon Crane, MD;  Location: [facility];  Service: Ophthalmology;  Laterality: Left;  . Appendectomy    . Laparotomy N/A 09/27/2014    Procedure: Exploratory Laparotomy, Biopsy of Perforated Gastric Ulcer, Closure with omental patch;  Surgeon: [name], MD;  Location: [facility];  Service: General;  Laterality: N/A;  . Embolectomy Left 09/27/2014    Procedure: Left Brachial, Radial,Ulnar Embolectomy with patch angioplasty left brachial, radial and ulnar  artery;  Surgeon: [name], MD;  [facility];  Service: Vascular;  Laterality: Left;  . Embolectomy Left 09/27/2014    Procedure: Left Radial, Brachial, and Ulnar Thrombectomy; Left Brachial to Radial Bypass Graft using Greater Saphenous vein graft from Left Thigh; Left Saphenous Vein Harvest; Intraoperative Arteriogram; Intra-arterial administration of TPA;  Surgeon: [name], MD;  Location: [facility] OR;  Service: Vascular;  Laterality: Left;  . Tracheostomy N/A december 2015    There were no vitals filed for this visit.  Visit Diagnosis:  Proximal muscle weakness  Abnormality of gait  Poor  posture  Ataxia  Neuromuscular disease  Weakness of both legs      Subjective Assessment - 04/13/15 1833    Subjective Pt stated she continues to be in pain Bil hands, feet and legs, pain scale 4/10.   Currently in Pain? Yes   Pain Score 4    Pain Location Generalized   Pain Orientation Right;Left                         [facility] Adult PT Treatment/Exercise - 04/13/15 1835    Transfers   Sit to Stand 4: Min guard   Stand to Sit 4: Min Development worker, international aid Transfers 4: Min guard   Ambulation/Gait   Ambulation/Gait Yes   Ambulation/Gait Assistance 6: Modified independent (Device/Increase time)  RW with #, weight bearing support    Ambulation/Gait Assistance Details Utilized two 5# weights on RW   Ambulation Distance (Feet) 40 Feet  4 sets x 40 feet   Assistive device Rolling walker   Gait Comments 4 bouts with weight bearing support   Elbow Exercises   Forearm Supination --   Forearm Pronation --   Wrist Flexion --   Wrist Extension --   Knee/Hip Exercises: Supine   Other Supine Knee/Hip Exercises Supine adductor and passive gastroc stretches 3x 30"   Hand Exercises   MCPJ Flexion --   MCPJ Extension --   Hand Gripper with Large Beads --   Hand Gripper with Medium Beads --   Wrist Exercises   Wrist Radial Deviation --   Wrist Ulnar Deviation --   Manual Therapy   Manual Therapy --   Passive ROM --                  PT Short Term Goals - 04/13/15 1845    PT SHORT TERM GOAL #1   Title Patient will demonstrate a grade of at least 3+/5 in proximal musculature and core in order to promote functional stability and safe mobility skills    Status On-going   PT SHORT TERM GOAL #2   Title Patient will be able to participate in standing tasks with Min(A) from therapist and weights on limbs to reduce ataxia symptoms for at least 7 minutes at a time    Status On-going   PT SHORT TERM GOAL #3   Title Patient to experience no more than 4/10 at worst  on a daily basis at rest and during mobility    Status On-going   PT SHORT TERM GOAL #4   Title Patient will demonstrate the ability to ambulate at least 77ft x6 in body weight support system with weighted extremities and minimal fatigue, good gait mechanics throughout    PT SHORT TERM GOAL #5   Title Patient will be able to perform functional transfers, including sit<->stand and stand<->pivot from wheelchair to bed with close supervision  and no loss of balance    Status On-going   PT SHORT TERM GOAL #6   Title Patient will demonstrate improved posture while seated in wheelchair and will be able to state and demonstrate the importance of performing appropriate pressure relieving techniques consistently    Status On-going           PT Long Term Goals - 04/13/15 1846    PT LONG TERM GOAL #1   Title Patient will be independent in safely performed advanced HEP with assistance from caregivers as needed, updated PRN    PT LONG TERM GOAL #2   Title Patient will demonstrate at least 4/5 to 4+/5 muscle strength in all proximal muscles, bilateral lower extremities, and core    PT LONG TERM GOAL #3   Title Patient will demonstrate bilateral ankle DF of at least 10 degrees in order to improve overall gait mechanics and functional mobility skills    PT LONG TERM GOAL #4   Title Patient will demonstrate the ability to ambulate at least 137ft with LRAD, no more than distant supervision, no loss of balance, and minimal fatigue throughout    PT LONG TERM GOAL #5   Title Patient will be independent in performing functional transfers, including all bed mobility skills and sit<->stand and stand pivot transfers with Mod(I) and LRAD    PT LONG TERM GOAL #6   Title Patient will demonstrate the ability to stand at least 15 minutes before fatiguing and with good posture and minimal loss of balance    PT LONG TERM GOAL #7   Title Patient will regularly perform weight bearing activities at home with assistance  from caregivers as needed in order to protect skin and reduce detrimental effects of being sedentary                Plan - 04/13/15 1839    Clinical Impression Statement Pt. tolerated treatment well, good response with prolonged stretches to short-term mobility improvements.  Min guard required with transfers and pt able to verbalize important steps for safety prior transfers.  Pt continues to show minimal progress with balance during gait training and pain control with increased tone in legs.  Began gait training with weigthbearing support for more therapist facilitation with balance and static stance activities to improve balance.  Pt with tendency to posterior lean upon standing, requiring therapist facilitaiton to improve posture.  Pt limited by fatigue at end of session, no reports of pain.  Feel pt will be good candidate for body weight support on treadmill for gait training.     PT Next Visit Plan DC hamstrings stretches as length is looking very good. Continue to perform stretching of long adductors in supine, stretching of plantar flexors, strengthening of R dorsiflexion, and tolerance to AMB. Maintain cuff weight on walker to improve control with imbulation . Pt is good candidate for particpation in body-weight support treadmill gait training.         Problem List Patient Active Problem List   Diagnosis Date Noted  . Primary hypercoagulable state 03/19/2015  . Chronic respiratory failure 03/19/2015  . Insomnia 02/02/2015  . MRSA pneumonia 10/11/2014  . Abdominal abscess   . Candidemia   . Screen for STD (sexually transmitted disease)   . 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 Perforated gastric ulcer 09/27/2014  . Prediabetes 07/23/2014  . Unspecified hereditary and idiopathic peripheral neuropathy 04/15/2014  . CVA (cerebral infarction) 04/02/2014  . Hypertriglyceridemia 12/11/2013  . Stiffness of joint, not  elsewhere classified, ankle and foot 11/12/2013  . Lack of  coordination 11/03/2013  . Pernicious anemia 09/12/2013  . Elevated transaminase level 09/12/2013  . Ataxia 09/09/2013  . Difficulty in walking(719.7) 09/09/2013  . Weakness of both legs 09/09/2013  . Generalized weakness 02/11/2013  . Tobacco abuse 01/14/2013  . Pedal edema 01/13/2013  . Chronic pain syndrome 12/30/2012  . Reactive airway disease 12/30/2012   [name], PTA  [name] 04/13/2015, 6:48 PM  [place] [address] [place], [place], [address] Phone: [phone]   Fax:  [phone]

## 2015-04-13 NOTE — Therapy (Signed)
[place], [place], [address] Phone: [phone]   Fax:  [phone]  Speech Language Pathology Treatment  Patient Details  Name: [name] MRN: [id number] Date of Birth: 1966-10-05 Referring Provider:  Kathyrn Drown, MD  Encounter Date: 04/13/2015      End of Session - 04/13/15 2242    Visit Number 2   Number of Visits 8   Date for SLP Re-Evaluation 05/10/15   Authorization Type Medicare   Authorization Time Period 03/11/2015-05/10/2015   SLP Start Time 1345   SLP Stop Time  1430   SLP Time Calculation (min) 45 min   Activity Tolerance Patient tolerated treatment well      Past Medical History  Diagnosis Date  . Anxiety   . Depression   . COPD (chronic obstructive pulmonary disease)   . MS (multiple sclerosis)   . Fibromyalgia   . Impaired fasting glucose   . 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 Eating disorder   . Chronic low back pain   . PONV (postoperative nausea and vomiting)   . Chronic bronchitis     "yearly" (02/11/2013)  . Borderline diabetes   . GERD (gastroesophageal reflux disease)   . Migraine     "I use to" (02/11/2013)  . Arthritis     "hands & feet" (02/11/2013)  . Peripheral neuropathy     [name] 02/11/2013  . B12 deficiency anemia     [name] 02/11/2013  . Chronic pain syndrome     [name] 02/11/2013  . UTI (lower urinary tract infection) 02/11/2013    [name] 02/11/2013  . Chronic edema     BLE/notes 02/11/2013  . Ataxia   . Peripheral neuropathy   . Muscle weakness of lower extremity     bilateral  . Hypertension     Past Surgical History  Procedure Laterality Date  . Abdominal hysterectomy  ~ 1987; ~ 2004    "woodward; ferguson" (02/11/2013)  . Cesarean section  3546; 1984; 1986  . Anterior cervical decomp/discectomy fusion  2009; 2011  . Tonsillectomy  1990's?  . Cataract extraction w/phaco  06/20/2011    Procedure: CATARACT EXTRACTION PHACO AND  INTRAOCULAR LENS PLACEMENT (IOC);  Surgeon: [name] T. Gershon Crane;  Location: [facility];  Service: Ophthalmology;  Laterality: Left;  CDE 1.81  . Cataract extraction w/phaco  07/04/2011    Procedure: CATARACT EXTRACTION PHACO AND INTRAOCULAR LENS PLACEMENT (IOC);  Surgeon: [name] T. Gershon Crane;  Location: [facility];  Service: Ophthalmology;  Laterality: Right;  CDE: 1.76  . Yag laser application Right 5/68/1275    Procedure: YAG LASER APPLICATION;  Surgeon: [name] T. Gershon Crane, MD;  Location: [facility];  Service: Ophthalmology;  Laterality: Right;  . Yag laser application Left 1/70/0174    Procedure: YAG LASER APPLICATION;  Surgeon: [name] T. Gershon Crane, MD;  Location: [facility];  Service: Ophthalmology;  Laterality: Left;  . Appendectomy    . Laparotomy N/A 09/27/2014    Procedure: Exploratory Laparotomy, Biopsy of Perforated Gastric Ulcer, Closure with omental patch;  Surgeon: [name], MD;  Location: [place];  Service: General;  Laterality: N/A;  . Embolectomy Left 09/27/2014    Procedure: Left Brachial, Radial,Ulnar Embolectomy with patch angioplasty left brachial, radial and ulnar artery;  Surgeon: [name], MD;  Location: [place] [facility] OR;  Service: Vascular;  Laterality: Left;  . Embolectomy Left 09/27/2014    Procedure: Left Radial, Brachial, and Ulnar Thrombectomy; Left Brachial to Radial Bypass Graft using Greater  Saphenous vein graft from Left Thigh; Left Saphenous Vein Harvest; Intraoperative Arteriogram; Intra-arterial administration of TPA;  Surgeon: [name], MD;  Location: [facility] OR;  Service: Vascular;  Laterality: Left;  . Tracheostomy N/A december 2015    There were no vitals filed for this visit.  Visit Diagnosis: Dysphonia  Ataxic dysarthria             ADULT SLP TREATMENT - 04/13/15 1700    General Information   Behavior/Cognition Alert;Cooperative;Pleasant mood   Patient Positioning Upright in chair   Oral care provided N/A   Treatment Provided   Treatment provided Cognitive-Linquistic    Pain Assessment   Pain Assessment No/denies pain   Cognitive-Linquistic Treatment   Treatment focused on Dysarthria;Voice;Patient/family/caregiver education   Skilled Treatment breath support exercises, vocal fold adduction, speech intelligibility strategies   Assessment / Recommendations / Plan   Plan Continue with current plan of care            SLP Short Term Goals - 04/13/15 2251    SLP SHORT TERM GOAL #1   Title Pt will increase breath support to 10 seconds on exhalation tasks.   Baseline 7   Time 4   Period Weeks   Status On-going   SLP SHORT TERM GOAL #2   Title Pt will implement speech intelligibility strategies with mild cues during structured sentence-level repetition and/or reading tasks as judged by clinician.   Baseline mod cues   Time 4   Period Weeks   Status On-going   SLP SHORT TERM GOAL #3   Title Pt will participate in ~10-minute conversational tasks with SLP only asking for clarification of spoken words 2x.   Baseline 4x   Time 4   Period Weeks   Status On-going          SLP Long Term Goals - 04/13/15 2251    SLP LONG TERM GOAL #1   Title Pt will improve speech intelligiblity to [facility] with use of strategies and self correction prn during conversations in home/community setting.    Baseline Min impairment   Time 8   Period Weeks   Status On-going          Plan - 04/13/15 2243    Clinical Impression Statement First treatment since initial evaluation- pt alert and cooperative. Goals reiewed and copy given to patient; she expresses agreement with plan of care. She continues to report that people have a difficult time understanding her. Breath support exercises introduced. Pt was encouraged to "smell the flowers and blow out the candles" and was able to implement to the count of 5 for exhalation. She sustained /s/ and /z/ for 9.2 and ~7 seconds respectively. Vocal fold adduction exercises implemented with mod verbal cues for "uh oh" and "huh". During  structured speech intelligibility tasks at the word level, she implemented strategies with min cue and required mod verbal cue and demonstration for sentence level. Speech noticeablly improved when pt focuses on pausing slightly between words. Continue POC.    Speech Therapy Frequency 2x / week   Duration --  8 weeks   Treatment/Interventions Cueing hierarchy;SLP instruction and feedback;Compensatory strategies;Patient/family education   Potential to Achieve Goals Good   Potential Considerations Previous level of function   SLP Home Exercise Plan Pt will be independent with HEP to facilitate carryover of treatment strategies and techniques at home.    Consulted and Agree with Plan of Care Patient        Problem List Patient Active Problem  List   Diagnosis Date Noted  . Primary hypercoagulable state 03/19/2015  . Chronic respiratory failure 03/19/2015  . Insomnia 02/02/2015  . MRSA pneumonia 10/11/2014  . Abdominal abscess   . Candidemia   . Screen for STD (sexually transmitted disease)   . 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 Perforated gastric ulcer 09/27/2014  . Prediabetes 07/23/2014  . Unspecified hereditary and idiopathic peripheral neuropathy 04/15/2014  . CVA (cerebral infarction) 04/02/2014  . Hypertriglyceridemia 12/11/2013  . Stiffness of joint, not elsewhere classified, ankle and foot 11/12/2013  . Lack of coordination 11/03/2013  . Pernicious anemia 09/12/2013  . Elevated transaminase level 09/12/2013  . Ataxia 09/09/2013  . Difficulty in walking(719.7) 09/09/2013  . Weakness of both legs 09/09/2013  . Generalized weakness 02/11/2013  . Tobacco abuse 01/14/2013  . Pedal edema 01/13/2013  . Chronic pain syndrome 12/30/2012  . Reactive airway disease 12/30/2012   Thank you,  Genene Churn, [place]  [facility] 04/13/2015, 10:54 PM  [place] [address] [place], [place], [address] Phone:  [phone]   Fax:  [phone]

## 2015-04-13 NOTE — Therapy (Signed)
[place], [place], [address] Phone: [phone]   Fax:  [phone]  Occupational Therapy Reassessment & Treatment  Patient Details  Name: [name] MRN: [id number] Date of Birth: 09/02/1966 Referring Provider:  Kathyrn Drown, MD  Encounter Date: 04/13/2015      OT End of Session - 04/13/15 1606    Visit Number 8   Number of Visits 16   Date for OT Re-Evaluation 05/10/15   Authorization Type Medicare A   Authorization Time Period before 10th visit   Authorization - Visit Number 8   Authorization - Number of Visits 10   OT Start Time 1512   OT Stop Time 1600   OT Time Calculation (min) 48 min   Activity Tolerance Patient tolerated treatment well   Behavior During Therapy [facility] for tasks assessed/performed      Past Medical History  Diagnosis Date  . Anxiety   . Depression   . COPD (chronic obstructive pulmonary disease)   . MS (multiple sclerosis)   . Fibromyalgia   . Impaired fasting glucose   . 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 Eating disorder   . Chronic low back pain   . PONV (postoperative nausea and vomiting)   . Chronic bronchitis     "yearly" (02/11/2013)  . Borderline diabetes   . GERD (gastroesophageal reflux disease)   . Migraine     "I use to" (02/11/2013)  . Arthritis     "hands & feet" (02/11/2013)  . Peripheral neuropathy     [name] 02/11/2013  . B12 deficiency anemia     [name] 02/11/2013  . Chronic pain syndrome     [name] 02/11/2013  . UTI (lower urinary tract infection) 02/11/2013    [name] 02/11/2013  . Chronic edema     BLE/notes 02/11/2013  . Ataxia   . Peripheral neuropathy   . Muscle weakness of lower extremity     bilateral  . Hypertension     Past Surgical History  Procedure Laterality Date  . Abdominal hysterectomy  ~ 1987; ~ 2004    "woodward; ferguson" (02/11/2013)  . Cesarean section  2706; 1984; 1986  . Anterior cervical  decomp/discectomy fusion  2009; 2011  . Tonsillectomy  1990's?  . Cataract extraction w/phaco  06/20/2011    Procedure: CATARACT EXTRACTION PHACO AND INTRAOCULAR LENS PLACEMENT (IOC);  Surgeon: [name] T. Gershon Crane;  Location: [facility];  Service: Ophthalmology;  Laterality: Left;  CDE 1.81  . Cataract extraction w/phaco  07/04/2011    Procedure: CATARACT EXTRACTION PHACO AND INTRAOCULAR LENS PLACEMENT (IOC);  Surgeon: [name] T. Gershon Crane;  Location: [facility];  Service: Ophthalmology;  Laterality: Right;  CDE: 1.76  . Yag laser application Right 2/37/6283    Procedure: YAG LASER APPLICATION;  Surgeon: [name] T. Gershon Crane, MD;  Location: [facility];  Service: Ophthalmology;  Laterality: Right;  . Yag laser application Left 1/51/7616    Procedure: YAG LASER APPLICATION;  Surgeon: [name] T. Gershon Crane, MD;  [facility];  Service: Ophthalmology;  Laterality: Left;  . Appendectomy    . Laparotomy N/A 09/27/2014    Procedure: Exploratory Laparotomy, Biopsy of Perforated Gastric Ulcer, Closure with omental patch;  Surgeon: [name], MD;  Location: [place];  Service: General;  Laterality: N/A;  . Embolectomy Left 09/27/2014    Procedure: Left Brachial, Radial,Ulnar Embolectomy with patch angioplasty left brachial, radial and ulnar artery;  Surgeon: [name], MD;  Location: [place];  Service: Vascular;  Laterality: Left;  . Embolectomy Left 09/27/2014    Procedure: Left Radial, Brachial, and Ulnar Thrombectomy; Left Brachial to Radial Bypass Graft using Greater Saphenous vein graft from Left Thigh; Left Saphenous Vein Harvest; Intraoperative Arteriogram; Intra-arterial administration of TPA;  Surgeon: [name], MD;  Location: [facility];  Service: Vascular;  Laterality: Left;  . Tracheostomy N/A december 2015    There were no vitals filed for this visit.  Visit Diagnosis:  Lack of coordination  Stiffness of hand joint, left  Hand muscle weakness  Hand joint pain, left      Subjective Assessment - 04/13/15  1513    Subjective  S: My pain was a 4 when I got here but now it's a 6.    Currently in Pain? Yes   Pain Score 6    Pain Location Generalized   Pain Orientation Right;Left   Pain Descriptors / Indicators Constant           [facility] OT Assessment - 04/13/15 1513    Assessment   Diagnosis left hand spaticity   Precautions   Precautions Fall   Coordination   9 Hole Peg Test Left;Right   Right 9 Hole Peg Test 1'07"  1'77" previous   Left 9 Hole Peg Test [phone]"  5'26" previous   AROM   Right/Left Wrist Right   Right Wrist Extension 54 Degrees  same as previous   Right Wrist Flexion 49 Degrees  50 previous   Right Wrist Radial Deviation 20 Degrees  24 previous   Right Wrist Ulnar Deviation 10 Degrees  24 previous   Left Wrist Extension 55 Degrees  48 previous   Left Wrist Flexion 32 Degrees  30 previous   Left Wrist Radial Deviation 8 Degrees  12 previous   Left Wrist Ulnar Deviation 12 Degrees  18 previous   Right/Left Finger Left   Right Composite Finger Extension --  100%; same as previous   Right Composite Finger Flexion --  100%; same as previous   Left Composite Finger Extension 75%  previous 50%   Left Composite Finger Flexion --  100%; same as previous   Right/Left Thumb --   PROM   Overall PROM Comments Pt able to tolerate moderate PROM to Left hand digits.    Strength   Overall Strength Comments Assessed seated.   Strength Assessment Site Hand;Shoulder;Elbow   Right/Left Shoulder Left   Right/Left Elbow Left   Left Elbow Flexion 4+/5  same as previous   Left Elbow Extension 4+/5  same as previous   Right/Left hand Left;Right   Right Hand Grip (lbs) 45  39 previous   Right Hand Lateral Pinch 12 lbs  same as previous   Right Hand 3 Point Pinch 14 lbs  10 previous   Left Hand Grip (lbs) 17  15 previous   Left Hand Lateral Pinch 6 lbs  same as previous   Left Hand 3 Point Pinch 5 lbs  3 previous   Left Hand AROM   L Long  MCP 0-90 0 Degrees   same as previous   L Long PIP 0-100 50 Degrees  same as previous   L Long DIP 0-70 8 Degrees  same as previous   L Ring  MCP 0-90 0 Degrees  same as previous   L Ring PIP 0-100 60 Degrees  64 previous   L Ring DIP 0-70 26 Degrees  16 previous   L Little  MCP 0-90 --  +10; +  26 previous   L Little PIP 0-100 38 Degrees  52 previous   L Little DIP 0-70 24 Degrees  42 previous            OT Treatments/Exercises (OP) - 04/13/15 1514    Exercises   Exercises Hand;Wrist   Wrist Exercises   Forearm Supination AROM;10 reps   Forearm Pronation AROM;10 reps   Wrist Flexion AROM;10 reps   Wrist Extension AROM;10 reps   Wrist Radial Deviation AROM;10 reps   Wrist Ulnar Deviation AROM;10 reps   Additional Wrist Exercises   Hand Gripper with Large Beads 6/6 beads gripper set at 20# Left   Hand Gripper with Medium Beads 13/13 beads gripper set at 20# left   Hand Exercises   MCPJ Flexion PROM;AROM;10 reps   MCPJ Extension PROM;AROM;10 reps   Manual Therapy   Manual Therapy Passive ROM   Passive ROM PROM completed to Left hand digits to decrease spaticity and increase functional use during daily tasks.             OT Short Term Goals - 04/13/15 1540    OT SHORT TERM GOAL #1   Title Patient will be educated and independent with HEP.   Status On-going   OT SHORT TERM GOAL #2   Title Patient will increase AROM of left hand by 5 degrees to increase ability to use left hand during daily tasks.    Status On-going   OT SHORT TERM GOAL #3   Title Therapist will fabricate finger extension splint to defer the chance of progressing spaticity.    Status Achieved   OT SHORT TERM GOAL #4   Title Patient will increase coordination in Bil hands by decreasing completion time of  9 hole peg test by 10 seconds.    Status Achieved   OT SHORT TERM GOAL #5   Title Patient will increase grip strength by 5# and pinch strength by 3#  of left hand to increase ability to use left hand as an  active assist during daily tasks.    Status On-going           OT Long Term Goals - 04/13/15 1541    OT LONG TERM GOAL #1   Title Patient will return to highest level of independence with all daily activities.    Status On-going   OT LONG TERM GOAL #2   Title Patient will increase AROM of left hand by 10 degrees.   Status On-going   OT LONG TERM GOAL #3   Title Patient will increase PROM of left hand to [facility] to decrease the progression of spaticity   Status On-going   OT LONG TERM GOAL #4   Title Patient will increase grip strength by 10# and pinch strength by 5# in LUE.   Status On-going   OT LONG TERM GOAL #5   Title Patient will increase Bil coordination by decreasing completion time of 9 hole peg test by 30 seconds.    Status Partially Met               Plan - 04/13/15 1606    Clinical Impression Statement A: Mini-reassessment completed this session. Pt met 2/5 STGs and partially met 1/5 LTGs, pt is progressing towards remaining goals. Pt reports being very fatigued at beginning of session, able to complete large and medium beads, unable to complete small beads due to hand being sore. Pt tolerated treatment well.    Plan P: Attempt small beads, fine motor coordination  activity, continue grip/pinch strengthening.         Problem List Patient Active Problem List   Diagnosis Date Noted  . Primary hypercoagulable state 03/19/2015  . Chronic respiratory failure 03/19/2015  . Insomnia 02/02/2015  . MRSA pneumonia 10/11/2014  . Abdominal abscess   . Candidemia   . Screen for STD (sexually transmitted disease)   . 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 Perforated gastric ulcer 09/27/2014  . Prediabetes 07/23/2014  . Unspecified hereditary and idiopathic peripheral neuropathy 04/15/2014  . CVA (cerebral infarction) 04/02/2014  . Hypertriglyceridemia 12/11/2013  . Stiffness of joint, not elsewhere classified, ankle and foot 11/12/2013  . Lack of coordination  11/03/2013  . Pernicious anemia 09/12/2013  . Elevated transaminase level 09/12/2013  . Ataxia 09/09/2013  . Difficulty in walking(719.7) 09/09/2013  . Weakness of both legs 09/09/2013  . Generalized weakness 02/11/2013  . Tobacco abuse 01/14/2013  . Pedal edema 01/13/2013  . Chronic pain syndrome 12/30/2012  . Reactive airway disease 12/30/2012    Guadelupe Sabin, OTR/L  [phone]  04/13/2015, 4:10 PM  [place] [address] [place], [place], [address] Phone: [phone]   Fax:  [phone]

## 2015-04-14 ENCOUNTER — Other Ambulatory Visit (HOSPITAL_COMMUNITY): Payer: Self-pay | Admitting: Respiratory Therapy

## 2015-04-14 DIAGNOSIS — R5383 Other fatigue: Secondary | ICD-10-CM

## 2015-04-14 DIAGNOSIS — R0683 Snoring: Secondary | ICD-10-CM

## 2015-04-14 DIAGNOSIS — G4730 Sleep apnea, unspecified: Secondary | ICD-10-CM

## 2015-04-14 NOTE — Telephone Encounter (Signed)
Pt has decided to go ahead with sleep study, order sent to [facility], they will contact pt to set up

## 2015-04-15 ENCOUNTER — Ambulatory Visit (HOSPITAL_COMMUNITY): Payer: BLUE CROSS/BLUE SHIELD | Admitting: Physical Therapy

## 2015-04-15 ENCOUNTER — Ambulatory Visit (HOSPITAL_COMMUNITY): Payer: BLUE CROSS/BLUE SHIELD | Admitting: Speech Pathology

## 2015-04-15 ENCOUNTER — Ambulatory Visit (HOSPITAL_COMMUNITY): Payer: BLUE CROSS/BLUE SHIELD | Admitting: Occupational Therapy

## 2015-04-15 ENCOUNTER — Encounter (HOSPITAL_COMMUNITY): Payer: Self-pay | Admitting: Occupational Therapy

## 2015-04-15 DIAGNOSIS — R490 Dysphonia: Secondary | ICD-10-CM

## 2015-04-15 DIAGNOSIS — R471 Dysarthria and anarthria: Secondary | ICD-10-CM

## 2015-04-15 DIAGNOSIS — R269 Unspecified abnormalities of gait and mobility: Secondary | ICD-10-CM

## 2015-04-15 DIAGNOSIS — G709 Myoneural disorder, unspecified: Secondary | ICD-10-CM

## 2015-04-15 DIAGNOSIS — M25642 Stiffness of left hand, not elsewhere classified: Secondary | ICD-10-CM

## 2015-04-15 DIAGNOSIS — R279 Unspecified lack of coordination: Secondary | ICD-10-CM

## 2015-04-15 DIAGNOSIS — M6281 Muscle weakness (generalized): Secondary | ICD-10-CM

## 2015-04-15 DIAGNOSIS — Z7409 Other reduced mobility: Secondary | ICD-10-CM

## 2015-04-15 DIAGNOSIS — M6289 Other specified disorders of muscle: Secondary | ICD-10-CM | POA: Diagnosis not present

## 2015-04-15 DIAGNOSIS — R293 Abnormal posture: Secondary | ICD-10-CM

## 2015-04-15 DIAGNOSIS — R29898 Other symptoms and signs involving the musculoskeletal system: Secondary | ICD-10-CM

## 2015-04-15 DIAGNOSIS — M79642 Pain in left hand: Secondary | ICD-10-CM

## 2015-04-15 NOTE — Therapy (Signed)
[place], [place], [address] Phone: [phone]   Fax:  [phone]  Occupational Therapy Treatment  Patient Details  Name: [name] MRN: [id number] Date of Birth: [birth year] Referring Provider:  Kathyrn Drown, MD  Encounter Date: 04/15/2015      OT End of Session - 04/15/15 1619    Visit Number 9   Number of Visits 16   Date for OT Re-Evaluation 05/10/15   Authorization Type Medicare A   Authorization Time Period before 10th visit   Authorization - Visit Number 9   Authorization - Number of Visits 10   OT Start Time 1513   OT Stop Time 1558   OT Time Calculation (min) 45 min   Activity Tolerance Patient tolerated treatment well   Behavior During Therapy [facility] for tasks assessed/performed      Past Medical History  Diagnosis Date  . Anxiety   . Depression   . COPD (chronic obstructive pulmonary disease)   . MS (multiple sclerosis)   . Fibromyalgia   . Impaired fasting glucose   . 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 Eating disorder   . Chronic low back pain   . PONV (postoperative nausea and vomiting)   . Chronic bronchitis     "yearly" (02/11/2013)  . Borderline diabetes   . GERD (gastroesophageal reflux disease)   . Migraine     "I use to" (02/11/2013)  . Arthritis     "hands & feet" (02/11/2013)  . Peripheral neuropathy     [name] 02/11/2013  . B12 deficiency anemia     [name] 02/11/2013  . Chronic pain syndrome     [name] 02/11/2013  . UTI (lower urinary tract infection) 02/11/2013    [name] 02/11/2013  . Chronic edema     BLE/notes 02/11/2013  . Ataxia   . Peripheral neuropathy   . Muscle weakness of lower extremity     bilateral  . Hypertension     Past Surgical History  Procedure Laterality Date  . Abdominal hysterectomy  ~ 1987; ~ 2004    "woodward; ferguson" (02/11/2013)  . Cesarean section  3818; 1984; 1986  . Anterior cervical decomp/discectomy  fusion  2009; 2011  . Tonsillectomy  1990's?  . Cataract extraction w/phaco  06/20/2011    Procedure: CATARACT EXTRACTION PHACO AND INTRAOCULAR LENS PLACEMENT (IOC);  Surgeon: [name] T. Gershon Crane;  [facility];  Service: Ophthalmology;  Laterality: Left;  CDE 1.81  . Cataract extraction w/phaco  07/04/2011    Procedure: CATARACT EXTRACTION PHACO AND INTRAOCULAR LENS PLACEMENT (IOC);  Surgeon: [name] T. Gershon Crane;  [facility]: [facility];  Service: Ophthalmology;  Laterality: Right;  CDE: 1.76  . Yag laser application Right 2/99/3716    Procedure: YAG LASER APPLICATION;  Surgeon: [name] T. Gershon Crane, MD;  Location: [facility];  Service: Ophthalmology;  Laterality: Right;  . Yag laser application Left 9/67/8938    Procedure: YAG LASER APPLICATION;  Surgeon: [name] T. Gershon Crane, MD;  [facility]: [facility];  Service: Ophthalmology;  Laterality: Left;  . Appendectomy    . Laparotomy N/A 09/27/2014    Procedure: Exploratory Laparotomy, Biopsy of Perforated Gastric Ulcer, Closure with omental patch;  Surgeon: [name], MD;  Location: [place];  Service: General;  Laterality: N/A;  . Embolectomy Left 09/27/2014    Procedure: Left Brachial, Radial,Ulnar Embolectomy with patch angioplasty left brachial, radial and ulnar artery;  Surgeon: [name], MD;  Location: [facility];  Service: Vascular;  Laterality: Left;  . Embolectomy Left 09/27/2014    Procedure: Left Radial, Brachial, and Ulnar Thrombectomy; Left Brachial to Radial Bypass Graft using Greater Saphenous vein graft from Left Thigh; Left Saphenous Vein Harvest; Intraoperative Arteriogram; Intra-arterial administration of TPA;  Surgeon: [name], MD;  Location: [facility];  Service: Vascular;  Laterality: Left;  . Tracheostomy N/A december 2015    There were no vitals filed for this visit.  Visit Diagnosis:  Lack of coordination  Hand joint pain, left  Stiffness of hand joint, left  Hand muscle weakness      Subjective Assessment - 04/15/15 1513     Subjective  S: I'm worn out today.    Currently in Pain? Yes   Pain Score 4    Pain Location Hand   Pain Orientation Right;Left   Pain Descriptors / Indicators Aching;Constant            [facility] OT Assessment - 04/15/15 1513    Assessment   Diagnosis left hand spaticity   Precautions   Precautions Fall                  OT Treatments/Exercises (OP) - 04/15/15 1514    Exercises   Exercises Hand;Wrist   Wrist Exercises   Forearm Supination PROM;10 reps;AROM;15 reps   Forearm Pronation PROM;10 reps;AROM;15 reps   Wrist Flexion PROM;10 reps;AROM;15 reps   Wrist Extension PROM;10 reps;AROM;15 reps   Wrist Radial Deviation PROM;10 reps;AROM;15 reps   Wrist Ulnar Deviation PROM;10 reps;AROM;15 reps   Other wrist exercises Pt attempted velcro board working on supination, unable to complete task.    Additional Wrist Exercises   Hand Gripper with Small Beads 4/17 beads gripper set at 20#    Hand Exercises   MCPJ Flexion PROM;AROM;10 reps   MCPJ Extension PROM;AROM;10 reps   Fine Motor Coordination   Fine Motor Coordination Small Pegboard   Small Pegboard Pt completed small pegboard activity using LUE thumb and index finger in a two point pinch grasp and lateral pinch grasp to insert small pegs according to pattern. Pt able to complete task with mod difficulty and extended time. Pt intermittantly used right hand to assist in positoning peg in pinch grasp.    Manual Therapy   Manual Therapy Passive ROM   Passive ROM PROM completed to Left hand digits to decrease spaticity and increase functional use during daily tasks.                   OT Short Term Goals - 04/13/15 1540    OT SHORT TERM GOAL #1   Title Patient will be educated and independent with HEP.   Status On-going   OT SHORT TERM GOAL #2   Title Patient will increase AROM of left hand by 5 degrees to increase ability to use left hand during daily tasks.    Status On-going   OT SHORT TERM GOAL #3    Title Therapist will fabricate finger extension splint to defer the chance of progressing spaticity.    Status Achieved   OT SHORT TERM GOAL #4   Title Patient will increase coordination in Bil hands by decreasing completion time of  9 hole peg test by 10 seconds.    Status Achieved   OT SHORT TERM GOAL #5   Title Patient will increase grip strength by 5# and pinch strength by 3#  of left hand to increase ability to use left hand as an active assist during daily tasks.  Status On-going           OT Long Term Goals - 04/13/15 1541    OT LONG TERM GOAL #1   Title Patient will return to highest level of independence with all daily activities.    Status On-going   OT LONG TERM GOAL #2   Title Patient will increase AROM of left hand by 10 degrees.   Status On-going   OT LONG TERM GOAL #3   Title Patient will increase PROM of left hand to [facility] to decrease the progression of spaticity   Status On-going   OT LONG TERM GOAL #4   Title Patient will increase grip strength by 10# and pinch strength by 5# in LUE.   Status On-going   OT LONG TERM GOAL #5   Title Patient will increase Bil coordination by decreasing completion time of 9 hole peg test by 30 seconds.    Status Partially Met               Plan - 04/15/15 1619    Clinical Impression Statement A: Pt reports being worn out at beginning of session. Attempted small beads at 20#, 18#, and 15#; pt able to complete 4/17 beads at 15# before reporting her hand was sore. Pt completed small pegboard activity this session with mod diffculty.    Plan P: Update G-code, complete progress note. Continue working on [employer] and fine motor coordination.         Problem List Patient Active Problem List   Diagnosis Date Noted  . Primary hypercoagulable state 03/19/2015  . Chronic respiratory failure 03/19/2015  . Insomnia 02/02/2015  . MRSA pneumonia 10/11/2014  . Abdominal abscess   . Candidemia   . Screen for STD  (sexually transmitted disease)   . 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 Perforated gastric ulcer 09/27/2014  . Prediabetes 07/23/2014  . Unspecified hereditary and idiopathic peripheral neuropathy 04/15/2014  . CVA (cerebral infarction) 04/02/2014  . Hypertriglyceridemia 12/11/2013  . Stiffness of joint, not elsewhere classified, ankle and foot 11/12/2013  . Lack of coordination 11/03/2013  . Pernicious anemia 09/12/2013  . Elevated transaminase level 09/12/2013  . Ataxia 09/09/2013  . Difficulty in walking(719.7) 09/09/2013  . Weakness of both legs 09/09/2013  . Generalized weakness 02/11/2013  . Tobacco abuse 01/14/2013  . Pedal edema 01/13/2013  . Chronic pain syndrome 12/30/2012  . Reactive airway disease 12/30/2012    Guadelupe Sabin, OTR/L  [phone]  04/15/2015, 4:22 PM  [place] [address] [place], [place], [address] Phone: [phone]   Fax:  [phone]

## 2015-04-15 NOTE — Therapy (Signed)
[place], [place], [address] Phone: [phone]   Fax:  [phone]  Speech Language Pathology Treatment  Patient Details  Name: [name] MRN: [id number] Date of Birth: Feb 23, 1966 Referring Provider:  Kathyrn Drown, MD  Encounter Date: 04/15/2015      End of Session - 04/15/15 1531    Visit Number 3   Number of Visits 8   Date for SLP Re-Evaluation 05/10/15   Authorization Type Medicare   Authorization Time Period 03/11/2015-05/10/2015   SLP Start Time 1352   SLP Stop Time  1431   SLP Time Calculation (min) 39 min   Activity Tolerance Patient tolerated treatment well      Past Medical History  Diagnosis Date  . Anxiety   . Depression   . COPD (chronic obstructive pulmonary disease)   . MS (multiple sclerosis)   . Fibromyalgia   . Impaired fasting glucose   . 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 Eating disorder   . Chronic low back pain   . PONV (postoperative nausea and vomiting)   . Chronic bronchitis     "yearly" (02/11/2013)  . Borderline diabetes   . GERD (gastroesophageal reflux disease)   . Migraine     "I use to" (02/11/2013)  . Arthritis     "hands & feet" (02/11/2013)  . Peripheral neuropathy     [name] 02/11/2013  . B12 deficiency anemia     [name] 02/11/2013  . Chronic pain syndrome     [name] 02/11/2013  . UTI (lower urinary tract infection) 02/11/2013    [name] 02/11/2013  . Chronic edema     BLE/notes 02/11/2013  . Ataxia   . Peripheral neuropathy   . Muscle weakness of lower extremity     bilateral  . Hypertension     Past Surgical History  Procedure Laterality Date  . Abdominal hysterectomy  ~ 1987; ~ 2004    "woodward; ferguson" (02/11/2013)  . Cesarean section  7412; 1984; 1986  . Anterior cervical decomp/discectomy fusion  2009; 2011  . Tonsillectomy  1990's?  . Cataract extraction w/phaco  06/20/2011    Procedure: CATARACT EXTRACTION PHACO AND  INTRAOCULAR LENS PLACEMENT (IOC);  Surgeon: [name] T. Gershon Crane;  Location: [facility];  Service: Ophthalmology;  Laterality: Left;  CDE 1.81  . Cataract extraction w/phaco  07/04/2011    Procedure: CATARACT EXTRACTION PHACO AND INTRAOCULAR LENS PLACEMENT (IOC);  Surgeon: [name] T. Gershon Crane;  Location: [facility];  Service: Ophthalmology;  Laterality: Right;  CDE: 1.76  . Yag laser application Right 8/78/6767    Procedure: YAG LASER APPLICATION;  Surgeon: [name] T. Gershon Crane, MD;  Location: [facility];  Service: Ophthalmology;  Laterality: Right;  . Yag laser application Left 11/18/4707    Procedure: YAG LASER APPLICATION;  Surgeon: [name] T. Gershon Crane, MD;  Location: [facility];  Service: Ophthalmology;  Laterality: Left;  . Appendectomy    . Laparotomy N/A 09/27/2014    Procedure: Exploratory Laparotomy, Biopsy of Perforated Gastric Ulcer, Closure with omental patch;  Surgeon: [name], MD;  Location: [facility];  Service: General;  Laterality: N/A;  . Embolectomy Left 09/27/2014    Procedure: Left Brachial, Radial,Ulnar Embolectomy with patch angioplasty left brachial, radial and ulnar artery;  Surgeon: [name], MD;  Location: [facility];  Service: Vascular;  Laterality: Left;  . Embolectomy Left 09/27/2014    Procedure: Left Radial, Brachial, and Ulnar Thrombectomy; Left Brachial to Radial Bypass Graft using Greater  Saphenous vein graft from Left Thigh; Left Saphenous Vein Harvest; Intraoperative Arteriogram; Intra-arterial administration of TPA;  Surgeon: [name], MD;  Location: [facility];  Service: Vascular;  Laterality: Left;  . Tracheostomy N/A december 2015    There were no vitals filed for this visit.  Visit Diagnosis: Ataxic dysarthria  Dysphonia      Subjective Assessment - 04/15/15 1420    Subjective "I've got something to tell you."   Currently in Pain? No/denies               ADULT SLP TREATMENT - 04/15/15 0001    General Information   Behavior/Cognition Alert;Cooperative;Pleasant mood    Patient Positioning Upright in chair   Oral care provided N/A   Treatment Provided   Treatment provided Cognitive-Linquistic   Pain Assessment   Pain Assessment No/denies pain   Cognitive-Linquistic Treatment   Treatment focused on Dysarthria;Voice;Patient/family/caregiver education   Skilled Treatment breath support exercises, vocal fold adduction, speech intelligibility strategies   Assessment / Recommendations / Plan   Plan Continue with current plan of care          SLP Education - 04/15/15 1421    Education provided Yes   Education Details speech intelligibility strategies   Person(s) Educated Patient   Methods Explanation;Handout   Comprehension Verbalized understanding;Returned demonstration          SLP Short Term Goals - 04/15/15 1532    SLP SHORT TERM GOAL #1   Title Pt will increase breath support to 10 seconds on exhalation tasks.   Baseline 7   Time 4   Period Weeks   Status On-going   SLP SHORT TERM GOAL #2   Title Pt will implement speech intelligibility strategies with mild cues during structured sentence-level repetition and/or reading tasks as judged by clinician.   Baseline mod cues   Time 4   Period Weeks   Status On-going   SLP SHORT TERM GOAL #3   Title Pt will participate in ~10-minute conversational tasks with SLP only asking for clarification of spoken words 2x.   Baseline 4x   Time 4   Period Weeks   Status On-going          SLP Long Term Goals - 04/15/15 1532    SLP LONG TERM GOAL #1   Title Pt will improve speech intelligiblity to [facility] with use of strategies and self correction prn during conversations in home/community setting.    Baseline Min impairment   Time 8   Period Weeks   Status On-going          Plan - 04/15/15 1531    Clinical Impression Statement Pt saw neurologist yesterday and reports that an MRI has been ordered due to suspicion of CVA. She also plans to do the sleep study now. Session focused on increasing  breath support, vocal fold adduction, and implementation of speech intelligibility strategies in sentence generation tasks. She required mod cues initially which decreased to min cues after repetition. Continue POC.   Speech Therapy Frequency 2x / week   Duration --  8 weeks   Treatment/Interventions Cueing hierarchy;SLP instruction and feedback;Compensatory strategies;Patient/family education   Potential to Achieve Goals Good   Potential Considerations Previous level of function   SLP Home Exercise Plan Pt will be independent with HEP to facilitate carryover of treatment strategies and techniques at home.    Consulted and Agree with Plan of Care Patient        Problem List Patient Active Problem  List   Diagnosis Date Noted  . Primary hypercoagulable state 03/19/2015  . Chronic respiratory failure 03/19/2015  . Insomnia 02/02/2015  . MRSA pneumonia 10/11/2014  . Abdominal abscess   . Candidemia   . Screen for STD (sexually transmitted disease)   . 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 Perforated gastric ulcer 09/27/2014  . Prediabetes 07/23/2014  . Unspecified hereditary and idiopathic peripheral neuropathy 04/15/2014  . CVA (cerebral infarction) 04/02/2014  . Hypertriglyceridemia 12/11/2013  . Stiffness of joint, not elsewhere classified, ankle and foot 11/12/2013  . Lack of coordination 11/03/2013  . Pernicious anemia 09/12/2013  . Elevated transaminase level 09/12/2013  . Ataxia 09/09/2013  . Difficulty in walking(719.7) 09/09/2013  . Weakness of both legs 09/09/2013  . Generalized weakness 02/11/2013  . Tobacco abuse 01/14/2013  . Pedal edema 01/13/2013  . Chronic pain syndrome 12/30/2012  . Reactive airway disease 12/30/2012   Thank you,  Genene Churn, [place]  [address] [facility] 04/15/2015, 3:33 PM  [place] [address] [place], [place], [address] Phone: [phone]   Fax:  [phone]

## 2015-04-15 NOTE — Therapy (Signed)
[place], [place], [address] Phone: [phone]   Fax:  [phone]  Physical Therapy Treatment  Patient Details  Name: [name] MRN: [id number] Date of Birth: 1965/12/27 Referring Provider:  Kathyrn Drown, MD  Encounter Date: 04/15/2015      PT End of Session - 04/15/15 1515    Visit Number 15   Number of Visits 24   Date for PT Re-Evaluation 06/06/15   Authorization Type Medicare    Authorization Time Period 04/06/15-06/06/15; G-code done 13th visit    Authorization - Visit Number 15   Authorization - Number of Visits 23   PT Start Time 6962   PT Stop Time 1513   PT Time Calculation (min) 39 min   Equipment Utilized During Treatment Gait belt   Activity Tolerance Patient tolerated treatment well   Behavior During Therapy [facility] for tasks assessed/performed      Past Medical History  Diagnosis Date  . Anxiety   . Depression   . COPD (chronic obstructive pulmonary disease)   . MS (multiple sclerosis)   . Fibromyalgia   . Impaired fasting glucose   . 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 Eating disorder   . Chronic low back pain   . PONV (postoperative nausea and vomiting)   . Chronic bronchitis     "yearly" (02/11/2013)  . Borderline diabetes   . GERD (gastroesophageal reflux disease)   . Migraine     "I use to" (02/11/2013)  . Arthritis     "hands & feet" (02/11/2013)  . Peripheral neuropathy     [name] 02/11/2013  . B12 deficiency anemia     [name] 02/11/2013  . Chronic pain syndrome     [name] 02/11/2013  . UTI (lower urinary tract infection) 02/11/2013    [name] 02/11/2013  . Chronic edema     BLE/notes 02/11/2013  . Ataxia   . Peripheral neuropathy   . Muscle weakness of lower extremity     bilateral  . Hypertension     Past Surgical History  Procedure Laterality Date  . Abdominal hysterectomy  ~ 1987; ~ 2004    "woodward; ferguson" (02/11/2013)  . Cesarean  section  9528; 1984; 1986  . Anterior cervical decomp/discectomy fusion  2009; 2011  . Tonsillectomy  1990's?  . Cataract extraction w/phaco  06/20/2011    Procedure: CATARACT EXTRACTION PHACO AND INTRAOCULAR LENS PLACEMENT (IOC);  Surgeon: [name] T. Gershon Crane;  Location: [facility];  Service: Ophthalmology;  Laterality: Left;  CDE 1.81  . Cataract extraction w/phaco  07/04/2011    Procedure: CATARACT EXTRACTION PHACO AND INTRAOCULAR LENS PLACEMENT (IOC);  Surgeon: [name] T. Gershon Crane;  Location: [facility];  Service: Ophthalmology;  Laterality: Right;  CDE: 1.76  . Yag laser application Right 01/20/2439    Procedure: YAG LASER APPLICATION;  Surgeon: [name] T. Gershon Crane, MD;  Location: [facility];  Service: Ophthalmology;  Laterality: Right;  . Yag laser application Left 10/11/7251    Procedure: YAG LASER APPLICATION;  Surgeon: [name] T. Gershon Crane, MD;  Location: [facility];  Service: Ophthalmology;  Laterality: Left;  . Appendectomy    . Laparotomy N/A 09/27/2014    Procedure: Exploratory Laparotomy, Biopsy of Perforated Gastric Ulcer, Closure with omental patch;  Surgeon: [name], MD;  Location: [facility];  Service: General;  Laterality: N/A;  . Embolectomy Left 09/27/2014    Procedure: Left Brachial, Radial,Ulnar Embolectomy with patch angioplasty left brachial, radial and ulnar artery;  Surgeon:  [name], MD;  Location: [facility];  Service: Vascular;  Laterality: Left;  . Embolectomy Left 09/27/2014    Procedure: Left Radial, Brachial, and Ulnar Thrombectomy; Left Brachial to Radial Bypass Graft using Greater Saphenous vein graft from Left Thigh; Left Saphenous Vein Harvest; Intraoperative Arteriogram; Intra-arterial administration of TPA;  Surgeon: [name], MD;  Location: [facility];  Service: Vascular;  Laterality: Left;  . Tracheostomy N/A december 2015    There were no vitals filed for this visit.  Visit Diagnosis:  Lack of coordination  Proximal muscle weakness  Abnormality of gait  Poor  posture  Neuromuscular disease  Weakness of both legs  Impaired functional mobility and activity tolerance      Subjective Assessment - 04/15/15 1435    Subjective Pt reports that she continues have pain in bilateral hands, feet, and legs, rating the pain a 4/10.    Currently in Pain? Yes   Pain Score 4    Pain Location Generalized   Pain Orientation Right;Left              [facility] Adult PT Treatment/Exercise - 04/15/15 0001    Transfers   Sit to Stand 4: Min guard   Ambulation/Gait   Ambulation/Gait Yes   Ambulation/Gait Assistance 4: Min guard   Ambulation/Gait Assistance Details Utilized two 4# weights on walker   Ambulation Distance (Feet) 452 Feet  226 x 2   Assistive device Rolling walker   Knee/Hip Exercises: Stretches   Passive Hamstring Stretch 30 seconds;2 reps   Passive Hamstring Stretch Limitations seated in WC  ?HS length improving, close to 80-90 degrees   Knee/Hip Exercises: Standing   Other Standing Knee Exercises Sit to stand from mat table x10   Knee/Hip Exercises: Supine   Other Supine Knee/Hip Exercises Supine adductor and passive gastroc stretches 3x 30"                PT Education - 04/15/15 1514    Education provided Yes   Education Details Safety during turns when ambulating   Person(s) Educated Patient   Methods Explanation   Comprehension Verbalized understanding          PT Short Term Goals - 04/13/15 1845    PT SHORT TERM GOAL #1   Title Patient will demonstrate a grade of at least 3+/5 in proximal musculature and core in order to promote functional stability and safe mobility skills    Status On-going   PT SHORT TERM GOAL #2   Title Patient will be able to participate in standing tasks with Min(A) from therapist and weights on limbs to reduce ataxia symptoms for at least 7 minutes at a time    Status On-going   PT SHORT TERM GOAL #3   Title Patient to experience no more than 4/10 at worst on a daily basis at rest and  during mobility    Status On-going   PT SHORT TERM GOAL #4   Title Patient will demonstrate the ability to ambulate at least 56ft x6 in body weight support system with weighted extremities and minimal fatigue, good gait mechanics throughout    PT SHORT TERM GOAL #5   Title Patient will be able to perform functional transfers, including sit<->stand and stand<->pivot from wheelchair to bed with close supervision and no loss of balance    Status On-going   PT SHORT TERM GOAL #6   Title Patient will demonstrate improved posture while seated in wheelchair and will be able to state  and demonstrate the importance of performing appropriate pressure relieving techniques consistently    Status On-going           PT Long Term Goals - 04/13/15 1846    PT LONG TERM GOAL #1   Title Patient will be independent in safely performed advanced HEP with assistance from caregivers as needed, updated PRN    PT LONG TERM GOAL #2   Title Patient will demonstrate at least 4/5 to 4+/5 muscle strength in all proximal muscles, bilateral lower extremities, and core    PT LONG TERM GOAL #3   Title Patient will demonstrate bilateral ankle DF of at least 10 degrees in order to improve overall gait mechanics and functional mobility skills    PT LONG TERM GOAL #4   Title Patient will demonstrate the ability to ambulate at least 179ft with LRAD, no more than distant supervision, no loss of balance, and minimal fatigue throughout    PT LONG TERM GOAL #5   Title Patient will be independent in performing functional transfers, including all bed mobility skills and sit<->stand and stand pivot transfers with Mod(I) and LRAD    PT LONG TERM GOAL #6   Title Patient will demonstrate the ability to stand at least 15 minutes before fatiguing and with good posture and minimal loss of balance    PT LONG TERM GOAL #7   Title Patient will regularly perform weight bearing activities at home with assistance from caregivers as needed in  order to protect skin and reduce detrimental effects of being sedentary                Plan - 04/15/15 1515    Clinical Impression Statement Pt was able to complete sit to stand training with min A and verbal and tactile cueing for posture. She responded well to passive stretching of gastroc and adductors, denying any pain with treatment. Pt demonstrated some decreased safety awareness today when standing from wheelchair and making turns while ambulating, requiring verbal cueing for proper technique during these activities.    PT Next Visit Plan DC hamstrings stretches as length is looking very good. Continue to perform stretching of long adductors in supine, stretching of plantar flexors, strengthening of R dorsiflexion, and tolerance to AMB. Maintain cuff weight on walker to improve control with imbulation . Pt is good candidate for particpation in body-weight support treadmill gait training.    Consulted and Agree with Plan of Care Patient        Problem List Patient Active Problem List   Diagnosis Date Noted  . Primary hypercoagulable state 03/19/2015  . Chronic respiratory failure 03/19/2015  . Insomnia 02/02/2015  . MRSA pneumonia 10/11/2014  . Abdominal abscess   . Candidemia   . Screen for STD (sexually transmitted disease)   . 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 Perforated gastric ulcer 09/27/2014  . Prediabetes 07/23/2014  . Unspecified hereditary and idiopathic peripheral neuropathy 04/15/2014  . CVA (cerebral infarction) 04/02/2014  . Hypertriglyceridemia 12/11/2013  . Stiffness of joint, not elsewhere classified, ankle and foot 11/12/2013  . Lack of coordination 11/03/2013  . Pernicious anemia 09/12/2013  . Elevated transaminase level 09/12/2013  . Ataxia 09/09/2013  . Difficulty in walking(719.7) 09/09/2013  . Weakness of both legs 09/09/2013  . Generalized weakness 02/11/2013  . Tobacco abuse 01/14/2013  . Pedal edema 01/13/2013  . Chronic pain  syndrome 12/30/2012  . Reactive airway disease 12/30/2012    Hilma Favors, PT, DPT [phone] 04/15/2015, 4:17 PM  [facility]  [place], [place], [address] Phone: [phone]   Fax:  [phone]

## 2015-04-20 ENCOUNTER — Ambulatory Visit (HOSPITAL_COMMUNITY): Payer: BLUE CROSS/BLUE SHIELD | Admitting: Physical Therapy

## 2015-04-20 DIAGNOSIS — Z7409 Other reduced mobility: Secondary | ICD-10-CM

## 2015-04-20 DIAGNOSIS — R270 Ataxia, unspecified: Secondary | ICD-10-CM

## 2015-04-20 DIAGNOSIS — R29898 Other symptoms and signs involving the musculoskeletal system: Secondary | ICD-10-CM

## 2015-04-20 DIAGNOSIS — R279 Unspecified lack of coordination: Secondary | ICD-10-CM

## 2015-04-20 DIAGNOSIS — M6289 Other specified disorders of muscle: Secondary | ICD-10-CM | POA: Diagnosis not present

## 2015-04-20 DIAGNOSIS — M6281 Muscle weakness (generalized): Secondary | ICD-10-CM

## 2015-04-20 DIAGNOSIS — R293 Abnormal posture: Secondary | ICD-10-CM

## 2015-04-20 DIAGNOSIS — R269 Unspecified abnormalities of gait and mobility: Secondary | ICD-10-CM

## 2015-04-20 NOTE — Therapy (Signed)
[place], [place], [address] Phone: [phone]   Fax:  [phone]  Physical Therapy Treatment  Patient Details  Name: [name] MRN: [id number] Date of Birth: 06-Jun-1966 Referring Provider:  Kathyrn Drown, MD  Encounter Date: 04/20/2015      PT End of Session - 04/20/15 1443    Visit Number 16   Number of Visits 24   Date for PT Re-Evaluation 06/06/15   Authorization Type Medicare    Authorization Time Period 04/06/15-06/06/15; G-code done 13th visit    Authorization - Visit Number 16   Authorization - Number of Visits 23   PT Start Time 9470   PT Stop Time 1431   PT Time Calculation (min) 39 min   Equipment Utilized During Treatment Gait belt   Activity Tolerance Patient limited by fatigue   Behavior During Therapy [facility] for tasks assessed/performed      Past Medical History  Diagnosis Date  . Anxiety   . Depression   . COPD (chronic obstructive pulmonary disease)   . MS (multiple sclerosis)   . Fibromyalgia   . Impaired fasting glucose   . 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 Eating disorder   . Chronic low back pain   . PONV (postoperative nausea and vomiting)   . Chronic bronchitis     "yearly" (02/11/2013)  . Borderline diabetes   . GERD (gastroesophageal reflux disease)   . Migraine     "I use to" (02/11/2013)  . Arthritis     "hands & feet" (02/11/2013)  . Peripheral neuropathy     [name] 02/11/2013  . B12 deficiency anemia     [name] 02/11/2013  . Chronic pain syndrome     [name] 02/11/2013  . UTI (lower urinary tract infection) 02/11/2013    [name] 02/11/2013  . Chronic edema     BLE/notes 02/11/2013  . Ataxia   . Peripheral neuropathy   . Muscle weakness of lower extremity     bilateral  . Hypertension     Past Surgical History  Procedure Laterality Date  . Abdominal hysterectomy  ~ 1987; ~ 2004    "woodward; ferguson" (02/11/2013)  . Cesarean  section  9628; 1984; 1986  . Anterior cervical decomp/discectomy fusion  2009; 2011  . Tonsillectomy  1990's?  . Cataract extraction w/phaco  06/20/2011    Procedure: CATARACT EXTRACTION PHACO AND INTRAOCULAR LENS PLACEMENT (IOC);  Surgeon: [name] T. Gershon Crane;  Location: [facility];  Service: Ophthalmology;  Laterality: Left;  CDE 1.81  . Cataract extraction w/phaco  07/04/2011    Procedure: CATARACT EXTRACTION PHACO AND INTRAOCULAR LENS PLACEMENT (IOC);  Surgeon: [name] T. Gershon Crane;  Location: [facility];  Service: Ophthalmology;  Laterality: Right;  CDE: 1.76  . Yag laser application Right 3/66/2947    Procedure: YAG LASER APPLICATION;  Surgeon: [name] T. Gershon Crane, MD;  Location: [facility];  Service: Ophthalmology;  Laterality: Right;  . Yag laser application Left 6/54/6503    Procedure: YAG LASER APPLICATION;  Surgeon: [name] T. Gershon Crane, MD;  Location: [facility];  Service: Ophthalmology;  Laterality: Left;  . Appendectomy    . Laparotomy N/A 09/27/2014    Procedure: Exploratory Laparotomy, Biopsy of Perforated Gastric Ulcer, Closure with omental patch;  Surgeon: [name], MD;  Location: [facility];  Service: General;  Laterality: N/A;  . Embolectomy Left 09/27/2014    Procedure: Left Brachial, Radial,Ulnar Embolectomy with patch angioplasty left brachial, radial and ulnar artery;  Surgeon:  [name], MD;  Location[facility] OR;  Service: Vascular;  Laterality: Left;  . Embolectomy Left 09/27/2014    Procedure: Left Radial, Brachial, and Ulnar Thrombectomy; Left Brachial to Radial Bypass Graft using Greater Saphenous vein graft from Left Thigh; Left Saphenous Vein Harvest; Intraoperative Arteriogram; Intra-arterial administration of TPA;  Surgeon: [name], MD;  Location: [facility] OR;  Service: Vascular;  Laterality: Left;  . Tracheostomy N/A december 2015    There were no vitals filed for this visit.  Visit Diagnosis:  Lack of coordination  Proximal muscle weakness  Abnormality of gait  Poor  posture  Weakness of both legs  Impaired functional mobility and activity tolerance  Ataxia      Subjective Assessment - 04/20/15 1358    Subjective Pt reports that she has 4/10 pain in her hands, legs, and feet. She has been compliant with her HEP and walked a few times over the weekend.    Pain Score 4    Pain Location Leg   Pain Orientation Right;Left   Pain Score 4   Pain Location Hand   Pain Orientation Right;Left                         [facility] Adult PT Treatment/Exercise - 04/20/15 0001    Ambulation/Gait   Ambulation/Gait Yes   Ambulation/Gait Assistance 4: Min assist   Ambulation/Gait Assistance Details Utilized 2 4# weights on RW   Ambulation Distance (Feet) 200 Feet   Assistive device Rolling walker   Knee/Hip Exercises: Stretches   Gastroc Stretch 30 seconds;3 reps   Gastroc Stretch Limitations supine, also gentle mobilization through ankle eversion/inversion    Knee/Hip Exercises: Supine   Bridges 2 sets;10 reps   Straight Leg Raises 2 sets;10 reps   Other Supine Knee/Hip Exercises Supine adductor and passive gastroc stretches 3x 30"   Other Supine Knee/Hip Exercises supine clams with green tband 2x15                PT Education - 04/20/15 1443    Education provided Yes   Education Details Educated on safety during gait   Person(s) Educated Patient   Methods Explanation   Comprehension Verbalized understanding          PT Short Term Goals - 04/13/15 1845    PT SHORT TERM GOAL #1   Title Patient will demonstrate a grade of at least 3+/5 in proximal musculature and core in order to promote functional stability and safe mobility skills    Status On-going   PT SHORT TERM GOAL #2   Title Patient will be able to participate in standing tasks with Min(A) from therapist and weights on limbs to reduce ataxia symptoms for at least 7 minutes at a time    Status On-going   PT SHORT TERM GOAL #3   Title Patient to experience no more than  4/10 at worst on a daily basis at rest and during mobility    Status On-going   PT [place] #4   Title Patient will demonstrate the ability to ambulate at least 61ft x6 in body weight support system with weighted extremities and minimal fatigue, good gait mechanics throughout    PT SHORT TERM GOAL #5   Title Patient will be able to perform functional transfers, including sit<->stand and stand<->pivot from wheelchair to bed with close supervision and no loss of balance    Status On-going   PT SHORT TERM GOAL #6  Title Patient will demonstrate improved posture while seated in wheelchair and will be able to state and demonstrate the importance of performing appropriate pressure relieving techniques consistently    Status On-going           PT Long Term Goals - 04/13/15 1846    PT LONG TERM GOAL #1   Title Patient will be independent in safely performed advanced HEP with assistance from caregivers as needed, updated PRN    PT LONG TERM GOAL #2   Title Patient will demonstrate at least 4/5 to 4+/5 muscle strength in all proximal muscles, bilateral lower extremities, and core    PT LONG TERM GOAL #3   Title Patient will demonstrate bilateral ankle DF of at least 10 degrees in order to improve overall gait mechanics and functional mobility skills    PT LONG TERM GOAL #4   Title Patient will demonstrate the ability to ambulate at least 149ft with LRAD, no more than distant supervision, no loss of balance, and minimal fatigue throughout    PT LONG TERM GOAL #5   Title Patient will be independent in performing functional transfers, including all bed mobility skills and sit<->stand and stand pivot transfers with Mod(I) and LRAD    PT LONG TERM GOAL #6   Title Patient will demonstrate the ability to stand at least 15 minutes before fatiguing and with good posture and minimal loss of balance    PT LONG TERM GOAL #7   Title Patient will regularly perform weight bearing activities at home with  assistance from caregivers as needed in order to protect skin and reduce detrimental effects of being sedentary                Plan - 04/20/15 1444    Clinical Impression Statement Pt required increased level of assistance today during transfers and ambulation. She experienced 4 episodes of LOB that required min A to correct. Pt stated that she had increased fatigue today, and was not up for trying a second bout of ambulation.    PT Next Visit Plan Continue to perform stretching of long adductors in supine, stretching of plantar flexors, strengthening of R dorsiflexion, and tolerance to AMB. Maintain cuff weight on walker to improve control with imbulation . Pt is good candidate for particpation in body-weight support treadmill gait training.         Problem List Patient Active Problem List   Diagnosis Date Noted  . Primary hypercoagulable state 03/19/2015  . Chronic respiratory failure 03/19/2015  . Insomnia 02/02/2015  . MRSA pneumonia 10/11/2014  . Abdominal abscess   . Candidemia   . Screen for STD (sexually transmitted disease)   . 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 Perforated gastric ulcer 09/27/2014  . Prediabetes 07/23/2014  . Unspecified hereditary and idiopathic peripheral neuropathy 04/15/2014  . CVA (cerebral infarction) 04/02/2014  . Hypertriglyceridemia 12/11/2013  . Stiffness of joint, not elsewhere classified, ankle and foot 11/12/2013  . Lack of coordination 11/03/2013  . Pernicious anemia 09/12/2013  . Elevated transaminase level 09/12/2013  . Ataxia 09/09/2013  . Difficulty in walking(719.7) 09/09/2013  . Weakness of both legs 09/09/2013  . Generalized weakness 02/11/2013  . Tobacco abuse 01/14/2013  . Pedal edema 01/13/2013  . Chronic pain syndrome 12/30/2012  . Reactive airway disease 12/30/2012    Hilma Favors, PT, DPT [phone] 04/20/2015, 2:47 PM  [place], [place], [address] Phone: [phone]   Fax:  [phone]

## 2015-04-21 ENCOUNTER — Other Ambulatory Visit (HOSPITAL_COMMUNITY): Payer: Self-pay | Admitting: Respiratory Therapy

## 2015-04-22 ENCOUNTER — Ambulatory Visit (HOSPITAL_COMMUNITY): Payer: BLUE CROSS/BLUE SHIELD | Admitting: Physical Therapy

## 2015-04-22 ENCOUNTER — Encounter (HOSPITAL_COMMUNITY): Payer: Self-pay

## 2015-04-22 ENCOUNTER — Ambulatory Visit (HOSPITAL_COMMUNITY): Payer: BLUE CROSS/BLUE SHIELD

## 2015-04-22 DIAGNOSIS — M6281 Muscle weakness (generalized): Secondary | ICD-10-CM

## 2015-04-22 DIAGNOSIS — M6289 Other specified disorders of muscle: Secondary | ICD-10-CM | POA: Diagnosis not present

## 2015-04-22 DIAGNOSIS — R269 Unspecified abnormalities of gait and mobility: Secondary | ICD-10-CM

## 2015-04-22 DIAGNOSIS — Z7409 Other reduced mobility: Secondary | ICD-10-CM

## 2015-04-22 DIAGNOSIS — R270 Ataxia, unspecified: Secondary | ICD-10-CM

## 2015-04-22 DIAGNOSIS — R279 Unspecified lack of coordination: Secondary | ICD-10-CM

## 2015-04-22 DIAGNOSIS — M79642 Pain in left hand: Secondary | ICD-10-CM

## 2015-04-22 DIAGNOSIS — R29898 Other symptoms and signs involving the musculoskeletal system: Secondary | ICD-10-CM

## 2015-04-22 NOTE — Therapy (Signed)
[place], [place], [address] Phone: [phone]   Fax:  [phone]  Physical Therapy Treatment  Patient Details  Name: [name] MRN: [id number] Date of Birth: 1965/11/01 Referring Provider:  Kathyrn Drown, MD  Encounter Date: 04/22/2015      PT End of Session - 04/22/15 1622    Visit Number 17   Number of Visits 24   Date for PT Re-Evaluation 06/06/15   Authorization Type Medicare    Authorization Time Period 04/06/15-06/06/15; G-code done 13th visit    Authorization - Visit Number 26   Authorization - Number of Visits 23   PT Start Time 1300   PT Stop Time 1342   PT Time Calculation (min) 42 min   Equipment Utilized During Treatment Gait belt   Activity Tolerance Patient tolerated treatment well   Behavior During Therapy [facility] for tasks assessed/performed      Past Medical History  Diagnosis Date  . Anxiety   . Depression   . COPD (chronic obstructive pulmonary disease)   . MS (multiple sclerosis)   . Fibromyalgia   . Impaired fasting glucose   . 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 Eating disorder   . Chronic low back pain   . PONV (postoperative nausea and vomiting)   . Chronic bronchitis     "yearly" (02/11/2013)  . Borderline diabetes   . GERD (gastroesophageal reflux disease)   . Migraine     "I use to" (02/11/2013)  . Arthritis     "hands & feet" (02/11/2013)  . Peripheral neuropathy     [name] 02/11/2013  . B12 deficiency anemia     [name] 02/11/2013  . Chronic pain syndrome     [name] 02/11/2013  . UTI (lower urinary tract infection) 02/11/2013    [name] 02/11/2013  . Chronic edema     BLE/notes 02/11/2013  . Ataxia   . Peripheral neuropathy   . Muscle weakness of lower extremity     bilateral  . Hypertension     Past Surgical History  Procedure Laterality Date  . Abdominal hysterectomy  ~ 1987; ~ 2004    "woodward; ferguson" (02/11/2013)  . Cesarean  section  7106; 1984; 1986  . Anterior cervical decomp/discectomy fusion  2009; 2011  . Tonsillectomy  1990's?  . Cataract extraction w/phaco  06/20/2011    Procedure: CATARACT EXTRACTION PHACO AND INTRAOCULAR LENS PLACEMENT (IOC);  Surgeon: [name] T. Gershon Crane;  Location: [facility];  Service: Ophthalmology;  Laterality: Left;  CDE 1.81  . Cataract extraction w/phaco  07/04/2011    Procedure: CATARACT EXTRACTION PHACO AND INTRAOCULAR LENS PLACEMENT (IOC);  Surgeon: [name] T. Gershon Crane;  Location: [facility];  Service: Ophthalmology;  Laterality: Right;  CDE: 1.76  . Yag laser application Right 2/69/4854    Procedure: YAG LASER APPLICATION;  Surgeon: [name] T. Gershon Crane, MD;  Location: [facility];  Service: Ophthalmology;  Laterality: Right;  . Yag laser application Left 04/04/349    Procedure: YAG LASER APPLICATION;  Surgeon: [name] T. Gershon Crane, MD;  Location: [facility];  Service: Ophthalmology;  Laterality: Left;  . Appendectomy    . Laparotomy N/A 09/27/2014    Procedure: Exploratory Laparotomy, Biopsy of Perforated Gastric Ulcer, Closure with omental patch;  Surgeon: [name], MD;  Location: [place];  Service: General;  Laterality: N/A;  . Embolectomy Left 09/27/2014    Procedure: Left Brachial, Radial,Ulnar Embolectomy with patch angioplasty left brachial, radial and ulnar artery;  Surgeon:  [name], MD;  Location: [facility] OR;  Service: Vascular;  Laterality: Left;  . Embolectomy Left 09/27/2014    Procedure: Left Radial, Brachial, and Ulnar Thrombectomy; Left Brachial to Radial Bypass Graft using Greater Saphenous vein graft from Left Thigh; Left Saphenous Vein Harvest; Intraoperative Arteriogram; Intra-arterial administration of TPA;  Surgeon: [name], MD;  Location: [facility] OR;  Service: Vascular;  Laterality: Left;  . Tracheostomy N/A december 2015    There were no vitals filed for this visit.  Visit Diagnosis:  Lack of coordination  Abnormality of gait  Weakness of both legs  Impaired functional  mobility and activity tolerance  Ataxia      Subjective Assessment - 04/22/15 1321    Subjective Pt reports that she continues to have pain in her hands, legs, and feet, but denies any onset of new pain in different locations.    Pain Score 4    Pain Location Leg   Pain Orientation Right;Left   Pain Score 4   Pain Location Hand   Pain Orientation Right;Left                [facility] Adult PT Treatment/Exercise - 04/22/15 0001    Ambulation/Gait   Ambulation/Gait Yes   Ambulation/Gait Assistance 4: Min guard   Ambulation/Gait Assistance Details 4# on RW   Ambulation Distance (Feet) 226 Feet   Assistive device Rolling walker   Knee/Hip Exercises: Standing   Heel Raises 15 reps   Forward Step Up 1 set;10 reps;Step Height: 4"   Knee/Hip Exercises: Supine   Bridges 15 reps   Straight Leg Raises 2 sets;10 reps   Other Supine Knee/Hip Exercises Supine adductor and passive gastroc stretches 3x 30"   Other Supine Knee/Hip Exercises supine clams with blue tband 2x15                PT Education - 04/22/15 1622    Education provided No          PT Short Term Goals - 04/13/15 1845    PT SHORT TERM GOAL #1   Title Patient will demonstrate a grade of at least 3+/5 in proximal musculature and core in order to promote functional stability and safe mobility skills    Status On-going   PT SHORT TERM GOAL #2   Title Patient will be able to participate in standing tasks with Min(A) from therapist and weights on limbs to reduce ataxia symptoms for at least 7 minutes at a time    Status On-going   PT SHORT TERM GOAL #3   Title Patient to experience no more than 4/10 at worst on a daily basis at rest and during mobility    Status On-going   PT SHORT TERM GOAL #4   Title Patient will demonstrate the ability to ambulate at least 28ft x6 in body weight support system with weighted extremities and minimal fatigue, good gait mechanics throughout    PT SHORT TERM GOAL #5   Title  Patient will be able to perform functional transfers, including sit<->stand and stand<->pivot from wheelchair to bed with close supervision and no loss of balance    Status On-going   PT SHORT TERM GOAL #6   Title Patient will demonstrate improved posture while seated in wheelchair and will be able to state and demonstrate the importance of performing appropriate pressure relieving techniques consistently    Status On-going           PT Long Term Goals - 04/13/15 1846  PT LONG TERM GOAL #1   Title Patient will be independent in safely performed advanced HEP with assistance from caregivers as needed, updated PRN    PT LONG TERM GOAL #2   Title Patient will demonstrate at least 4/5 to 4+/5 muscle strength in all proximal muscles, bilateral lower extremities, and core    PT LONG TERM GOAL #3   Title Patient will demonstrate bilateral ankle DF of at least 10 degrees in order to improve overall gait mechanics and functional mobility skills    PT LONG TERM GOAL #4   Title Patient will demonstrate the ability to ambulate at least 174ft with LRAD, no more than distant supervision, no loss of balance, and minimal fatigue throughout    PT LONG TERM GOAL #5   Title Patient will be independent in performing functional transfers, including all bed mobility skills and sit<->stand and stand pivot transfers with Mod(I) and LRAD    PT LONG TERM GOAL #6   Title Patient will demonstrate the ability to stand at least 15 minutes before fatiguing and with good posture and minimal loss of balance    PT LONG TERM GOAL #7   Title Patient will regularly perform weight bearing activities at home with assistance from caregivers as needed in order to protect skin and reduce detrimental effects of being sedentary                Plan - 04/22/15 1622    Clinical Impression Statement Treatment focused on BLE strengthening and gait training to improve functional mobility. She was able to complete 226 feet  during gait training with verbal cueing for safety during turns and min A at one point for navigating RW.    PT Next Visit Plan Continue with stretching of plantarflexors, strengthening of R dorsiflexion, gait training, and standing BLE strengthening to improve standing tolerance.         Problem List Patient Active Problem List   Diagnosis Date Noted  . Primary hypercoagulable state 03/19/2015  . Chronic respiratory failure 03/19/2015  . Insomnia 02/02/2015  . MRSA pneumonia 10/11/2014  . Abdominal abscess   . Candidemia   . Screen for STD (sexually transmitted disease)   . 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 Perforated gastric ulcer 09/27/2014  . Prediabetes 07/23/2014  . Unspecified hereditary and idiopathic peripheral neuropathy 04/15/2014  . CVA (cerebral infarction) 04/02/2014  . Hypertriglyceridemia 12/11/2013  . Stiffness of joint, not elsewhere classified, ankle and foot 11/12/2013  . Lack of coordination 11/03/2013  . Pernicious anemia 09/12/2013  . Elevated transaminase level 09/12/2013  . Ataxia 09/09/2013  . Difficulty in walking(719.7) 09/09/2013  . Weakness of both legs 09/09/2013  . Generalized weakness 02/11/2013  . Tobacco abuse 01/14/2013  . Pedal edema 01/13/2013  . Chronic pain syndrome 12/30/2012  . Reactive airway disease 12/30/2012    Hilma Favors, PT, DPT [phone] 04/22/2015, 4:26 PM  [place], [place], [address] Phone: [phone]   Fax:  [phone]

## 2015-04-22 NOTE — Therapy (Signed)
[place], [place], [address] Phone: [phone]   Fax:  [phone]  Occupational Therapy Treatment  Patient Details  Name: [name] MRN: [id number] Date of Birth: [birth year] Referring Provider:  Kathyrn Drown, MD  Encounter Date: 04/22/2015      OT End of Session - 04/22/15 1637    Visit Number 10   Number of Visits 16   Date for OT Re-Evaluation 05/10/15   Authorization Type Medicare A   Authorization Time Period before 20th visit   Authorization - Visit Number 10   Authorization - Number of Visits 20   OT Start Time 1346   OT Stop Time 1430   OT Time Calculation (min) 44 min   Activity Tolerance Patient tolerated treatment well   Behavior During Therapy [facility] for tasks assessed/performed      Past Medical History  Diagnosis Date  . Anxiety   . Depression   . COPD (chronic obstructive pulmonary disease)   . MS (multiple sclerosis)   . Fibromyalgia   . Impaired fasting glucose   . 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 Eating disorder   . Chronic low back pain   . PONV (postoperative nausea and vomiting)   . Chronic bronchitis     "yearly" (02/11/2013)  . Borderline diabetes   . GERD (gastroesophageal reflux disease)   . Migraine     "I use to" (02/11/2013)  . Arthritis     "hands & feet" (02/11/2013)  . Peripheral neuropathy     [name] 02/11/2013  . B12 deficiency anemia     [name] 02/11/2013  . Chronic pain syndrome     [name] 02/11/2013  . UTI (lower urinary tract infection) 02/11/2013    [name] 02/11/2013  . Chronic edema     BLE/notes 02/11/2013  . Ataxia   . Peripheral neuropathy   . Muscle weakness of lower extremity     bilateral  . Hypertension     Past Surgical History  Procedure Laterality Date  . Abdominal hysterectomy  ~ 1987; ~ 2004    "woodward; ferguson" (02/11/2013)  . Cesarean section  3149; 1984; 1986  . Anterior cervical decomp/discectomy  fusion  2009; 2011  . Tonsillectomy  1990's?  . Cataract extraction w/phaco  06/20/2011    Procedure: CATARACT EXTRACTION PHACO AND INTRAOCULAR LENS PLACEMENT (IOC);  Surgeon: [name] T. Gershon Crane;  [facility];  Service: Ophthalmology;  Laterality: Left;  CDE 1.81  . Cataract extraction w/phaco  07/04/2011    Procedure: CATARACT EXTRACTION PHACO AND INTRAOCULAR LENS PLACEMENT (IOC);  Surgeon: [name] T. Gershon Crane;  Location: [facility];  Service: Ophthalmology;  Laterality: Right;  CDE: 1.76  . Yag laser application Right 04/09/6377    Procedure: YAG LASER APPLICATION;  Surgeon: [name] T. Gershon Crane, MD;  [facility];  Service: Ophthalmology;  Laterality: Right;  . Yag laser application Left 5/88/5027    Procedure: YAG LASER APPLICATION;  Surgeon: [name] T. Gershon Crane, MD;  [facility];  Service: Ophthalmology;  Laterality: Left;  . Appendectomy    . Laparotomy N/A 09/27/2014    Procedure: Exploratory Laparotomy, Biopsy of Perforated Gastric Ulcer, Closure with omental patch;  Surgeon: [name], MD;  Location: [place];  Service: General;  Laterality: N/A;  . Embolectomy Left 09/27/2014    Procedure: Left Brachial, Radial,Ulnar Embolectomy with patch angioplasty left brachial, radial and ulnar artery;  Surgeon: [name], MD;  Location: [place];  Service: Vascular;  Laterality: Left;  . Embolectomy Left 09/27/2014    Procedure: Left Radial, Brachial, and Ulnar Thrombectomy; Left Brachial to Radial Bypass Graft using Greater Saphenous vein graft from Left Thigh; Left Saphenous Vein Harvest; Intraoperative Arteriogram; Intra-arterial administration of TPA;  Surgeon: [name], MD;  Location: [facility];  Service: Vascular;  Laterality: Left;  . Tracheostomy N/A december 2015    There were no vitals filed for this visit.  Visit Diagnosis:  Lack of coordination  Hand joint pain, left  Hand muscle weakness      Subjective Assessment - 04/22/15 1421    Subjective  S: I don't think i can do  that. (pvc pipe and yellow putty task)   Currently in Pain? Yes   Pain Score 4    Pain Location Leg   Pain Orientation Left;Right   Pain Descriptors / Indicators Aching;Constant   Pain Type Acute pain                      OT Treatments/Exercises (OP) - 04/22/15 1417    Additional Wrist Exercises   Theraputty Roll;Grip;Pinch   Hand Exercises   Other Hand Exercises Pt utilized pvc pipe and yellow theraputty to push holes into putty. Patient completed half of putty with left hand and half with right hand.    Fine Motor Coordination   Small Pegboard Pt completed colored pegboard utilizing tweezers to follow pattern on card. Pt completed 30% of pattern with increased time.    Neurological Re-education Exercises   Theraputty - Flatten yellow   Theraputty - Roll yellow   Theraputty - Grip yellow                  OT Short Term Goals - 04/13/15 1540    OT SHORT TERM GOAL #1   Title Patient will be educated and independent with HEP.   Status On-going   OT SHORT TERM GOAL #2   Title Patient will increase AROM of left hand by 5 degrees to increase ability to use left hand during daily tasks.    Status On-going   OT SHORT TERM GOAL #3   Title Therapist will fabricate finger extension splint to defer the chance of progressing spaticity.    Status Achieved   OT SHORT TERM GOAL #4   Title Patient will increase coordination in Bil hands by decreasing completion time of  9 hole peg test by 10 seconds.    Status Achieved   OT SHORT TERM GOAL #5   Title Patient will increase grip strength by 5# and pinch strength by 3#  of left hand to increase ability to use left hand as an active assist during daily tasks.    Status On-going           OT Long Term Goals - 04/13/15 1541    OT LONG TERM GOAL #1   Title Patient will return to highest level of independence with all daily activities.    Status On-going   OT LONG TERM GOAL #2   Title Patient will increase AROM of  left hand by 10 degrees.   Status On-going   OT LONG TERM GOAL #3   Title Patient will increase PROM of left hand to [facility] to decrease the progression of spaticity   Status On-going   OT LONG TERM GOAL #4   Title Patient will increase grip strength by 10# and pinch strength by 5# in LUE.   Status On-going   OT LONG  TERM GOAL #5   Title Patient will increase Bil coordination by decreasing completion time of 9 hole peg test by 30 seconds.    Status Partially Met               Plan - May 03, 2015 1640    Clinical Impression Statement A: Pt completed a fine motor task utilizing tweezers. Pt with increased difficulty and needed increased time to complete.    Plan P: Cont to with tweezer task trying to complete entire pattern. Complete an activity while incorporating composite fingers extension.          G-Codes - 05/03/15 1637    Functional Assessment Tool Used Left grip strength (17#) vs. right grip strength (45#) - (37% impaired)   Functional Limitation Carrying, moving and handling objects   Carrying, Moving and Handling Objects Current Status (P5374) At least 20 percent but less than 40 percent impaired, limited or restricted   Carrying, Moving and Handling Objects Goal Status (M2707) At least 20 percent but less than 40 percent impaired, limited or restricted      Problem List Patient Active Problem List   Diagnosis Date Noted  . Primary hypercoagulable state 03/19/2015  . Chronic respiratory failure 03/19/2015  . Insomnia 02/02/2015  . MRSA pneumonia 10/11/2014  . Abdominal abscess   . Candidemia   . Screen for STD (sexually transmitted disease)   . 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 Perforated gastric ulcer 09/27/2014  . Prediabetes 07/23/2014  . Unspecified hereditary and idiopathic peripheral neuropathy 04/15/2014  . CVA (cerebral infarction) 04/02/2014  . Hypertriglyceridemia 12/11/2013  . Stiffness of joint, not elsewhere classified, ankle and foot 11/12/2013   . Lack of coordination 11/03/2013  . Pernicious anemia 09/12/2013  . Elevated transaminase level 09/12/2013  . Ataxia 09/09/2013  . Difficulty in walking(719.7) 09/09/2013  . Weakness of both legs 09/09/2013  . Generalized weakness 02/11/2013  . Tobacco abuse 01/14/2013  . Pedal edema 01/13/2013  . Chronic pain syndrome 12/30/2012  . Reactive airway disease 12/30/2012  Occupational Therapy Progress Note  Dates of Reporting Period: 03/11/15 to 05/03/2015  Objective Reports of Subjective Statement:   Objective Measurements:   1'07"  1'17" previous    Left 9 Hole Peg Test 2'19"  5'26" previous   AROM   Right/Left Wrist Right   Right Wrist Extension 54 Degrees  same as previous   Right Wrist Flexion 49 Degrees  50 previous   Right Wrist Radial Deviation 20 Degrees  24 previous   Right Wrist Ulnar Deviation 10 Degrees  24 previous   Left Wrist Extension 55 Degrees  48 previous   Left Wrist Flexion 32 Degrees  30 previous   Left Wrist Radial Deviation 8 Degrees  12 previous   Left Wrist Ulnar Deviation 12 Degrees  18 previous   Right/Left Finger Left   Right Composite Finger Extension --  100%; same as previous   Right Composite Finger Flexion --  100%; same as previous   Left Composite Finger Extension 75%  previous 50%   Left Composite Finger Flexion --  100%; same as previous   Right/Left Thumb --   PROM   Overall PROM Comments Pt able to tolerate moderate PROM to Left hand digits.    Strength   Overall Strength Comments Assessed seated.   Strength Assessment Site Hand;Shoulder;Elbow   Right/Left Shoulder Left   Right/Left Elbow Left   Left Elbow Flexion 4+/5  same as previous   Left Elbow Extension 4+/5  same as  previous   Right/Left hand Left;Right   Right Hand Grip (lbs) 45  39 previous   Right Hand Lateral Pinch 12 lbs  same as previous   Right Hand 3 Point Pinch 14  lbs  10 previous   Left Hand Grip (lbs) 17  15 previous   Left Hand Lateral Pinch 6 lbs  same as previous   Left Hand 3 Point Pinch 5 lbs  3 previous   Left Hand AROM   L Long MCP 0-90 0 Degrees  same as previous   L Long PIP 0-100 50 Degrees  same as previous   L Long DIP 0-70 8 Degrees  same as previous   L Ring MCP 0-90 0 Degrees  same as previous   L Ring PIP 0-100 60 Degrees  64 previous   L Ring DIP 0-70 26 Degrees  16 previous   L Little MCP 0-90 --  +10; +26 previous   L Little PIP 0-100 38 Degrees  52 previous   L Little DIP 0-70 24 Degrees  42 previous         Goal Update: No goals updated.Pt met 2/5 STGs and partially met 1/5 LTGs, pt is progressing towards remaining goals.  Plan: Continue with therapy with focus on [facility] [place], and coordination in bilateral hands needed to complete functional daily tasks.  Reason Skilled Services are Required: Skilled OT services needed to increase functional performance during daily tasks.  Ailene Ravel, OTR/L,CBIS  [phone]  04/22/2015, 4:58 PM  [place] [address] [place], [place], [address] Phone: [phone]   Fax:  [phone]

## 2015-04-23 ENCOUNTER — Other Ambulatory Visit: Payer: Self-pay | Admitting: Family Medicine

## 2015-04-28 ENCOUNTER — Encounter (HOSPITAL_COMMUNITY): Payer: Self-pay | Admitting: Occupational Therapy

## 2015-04-28 ENCOUNTER — Ambulatory Visit (HOSPITAL_COMMUNITY): Payer: BLUE CROSS/BLUE SHIELD | Admitting: Occupational Therapy

## 2015-04-28 ENCOUNTER — Ambulatory Visit (HOSPITAL_COMMUNITY): Payer: BLUE CROSS/BLUE SHIELD

## 2015-04-28 ENCOUNTER — Telehealth (HOSPITAL_COMMUNITY): Payer: Self-pay

## 2015-04-28 DIAGNOSIS — R293 Abnormal posture: Secondary | ICD-10-CM

## 2015-04-28 DIAGNOSIS — R29898 Other symptoms and signs involving the musculoskeletal system: Secondary | ICD-10-CM

## 2015-04-28 DIAGNOSIS — M6281 Muscle weakness (generalized): Secondary | ICD-10-CM

## 2015-04-28 DIAGNOSIS — R269 Unspecified abnormalities of gait and mobility: Secondary | ICD-10-CM

## 2015-04-28 DIAGNOSIS — M25642 Stiffness of left hand, not elsewhere classified: Secondary | ICD-10-CM

## 2015-04-28 DIAGNOSIS — R279 Unspecified lack of coordination: Secondary | ICD-10-CM

## 2015-04-28 DIAGNOSIS — M6289 Other specified disorders of muscle: Secondary | ICD-10-CM | POA: Diagnosis not present

## 2015-04-28 DIAGNOSIS — R270 Ataxia, unspecified: Secondary | ICD-10-CM

## 2015-04-28 DIAGNOSIS — M79642 Pain in left hand: Secondary | ICD-10-CM

## 2015-04-28 DIAGNOSIS — Z7409 Other reduced mobility: Secondary | ICD-10-CM

## 2015-04-28 NOTE — Therapy (Signed)
[place], [place], [address] Phone: [phone]   Fax:  [phone]  Occupational Therapy Treatment  Patient Details  Name: [name] MRN: [id number] Date of Birth: 1966/09/01 Referring Provider:  Kathyrn Drown, MD  Encounter Date: 04/28/2015      OT End of Session - 04/28/15 1646    Visit Number 11   Number of Visits 16   Date for OT Re-Evaluation 05/10/15   Authorization Type Medicare A   Authorization Time Period before 20th visit   Authorization - Visit Number 11   Authorization - Number of Visits 20   OT Start Time 1432   OT Stop Time 1519   OT Time Calculation (min) 47 min   Activity Tolerance Patient tolerated treatment well   Behavior During Therapy [facility] for tasks assessed/performed      Past Medical History  Diagnosis Date  . Anxiety   . Depression   . COPD (chronic obstructive pulmonary disease)   . MS (multiple sclerosis)   . Fibromyalgia   . Impaired fasting glucose   . 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 Eating disorder   . Chronic low back pain   . PONV (postoperative nausea and vomiting)   . Chronic bronchitis     "yearly" (02/11/2013)  . Borderline diabetes   . GERD (gastroesophageal reflux disease)   . Migraine     "I use to" (02/11/2013)  . Arthritis     "hands & feet" (02/11/2013)  . Peripheral neuropathy     [name] 02/11/2013  . B12 deficiency anemia     [name] 02/11/2013  . Chronic pain syndrome     [name] 02/11/2013  . UTI (lower urinary tract infection) 02/11/2013    [name] 02/11/2013  . Chronic edema     BLE/notes 02/11/2013  . Ataxia   . Peripheral neuropathy   . Muscle weakness of lower extremity     bilateral  . Hypertension     Past Surgical History  Procedure Laterality Date  . Abdominal hysterectomy  ~ 1987; ~ 2004    "woodward; ferguson" (02/11/2013)  . Cesarean section  0630; 1984; 1986  . Anterior cervical decomp/discectomy  fusion  2009; 2011  . Tonsillectomy  1990's?  . Cataract extraction w/phaco  06/20/2011    Procedure: CATARACT EXTRACTION PHACO AND INTRAOCULAR LENS PLACEMENT (IOC);  Surgeon: [name] T. Gershon Crane;  [facility];  Service: Ophthalmology;  Laterality: Left;  CDE 1.81  . Cataract extraction w/phaco  07/04/2011    Procedure: CATARACT EXTRACTION PHACO AND INTRAOCULAR LENS PLACEMENT (IOC);  Surgeon: [name] T. Gershon Crane;  [facility]: [facility];  Service: Ophthalmology;  Laterality: Right;  CDE: 1.76  . Yag laser application Right 1/60/1093    Procedure: YAG LASER APPLICATION;  Surgeon: [name] T. Gershon Crane, MD;  [facility];  Service: Ophthalmology;  Laterality: Right;  . Yag laser application Left 2/35/5732    Procedure: YAG LASER APPLICATION;  Surgeon: [name] T. Gershon Crane, MD;  [facility];  Service: Ophthalmology;  Laterality: Left;  . Appendectomy    . Laparotomy N/A 09/27/2014    Procedure: Exploratory Laparotomy, Biopsy of Perforated Gastric Ulcer, Closure with omental patch;  Surgeon: [name], MD;  Location: [place];  Service: General;  Laterality: N/A;  . Embolectomy Left 09/27/2014    Procedure: Left Brachial, Radial,Ulnar Embolectomy with patch angioplasty left brachial, radial and ulnar artery;  Surgeon: [name], MD;  Location: [place];  Service: Vascular;  Laterality: Left;  . Embolectomy Left 09/27/2014    Procedure: Left Radial, Brachial, and Ulnar Thrombectomy; Left Brachial to Radial Bypass Graft using Greater Saphenous vein graft from Left Thigh; Left Saphenous Vein Harvest; Intraoperative Arteriogram; Intra-arterial administration of TPA;  Surgeon: [name], MD;  Location: [facility] OR;  Service: Vascular;  Laterality: Left;  . Tracheostomy N/A december 2015    There were no vitals filed for this visit.  Visit Diagnosis:  Lack of coordination  Hand joint pain, left  Hand muscle weakness  Stiffness of hand joint, left      Subjective Assessment - 04/28/15 1645     Subjective  S: This is just so hard to do. (pegboard task)   Currently in Pain? Yes   Pain Score 8    Pain Location Generalized   Pain Orientation Right;Left   Pain Descriptors / Indicators Aching;Constant   Pain Type Acute pain            [facility] OT Assessment - 04/28/15 1433    Assessment   Diagnosis left hand spaticity   Precautions   Precautions Fall                  OT Treatments/Exercises (OP) - 04/28/15 1434    Exercises   Exercises Hand;Wrist   Hand Exercises   Other Hand Exercises Pt completed velcro board, using LUE digits to push a small roller back and forth in a vertical fashion along the board. Pt was instructed to use her digits versus the palm of her hand to encourage composite extension, pt had mod difficulty with task.    Fine Motor Coordination   Fine Motor Coordination Small Pegboard   Small Pegboard Pt completed colored pegboard utilizing tweezers to follow pattern on card. Pt completed 50% of pattern with increased time using bilateral hands. Pt had mod difficulty when using right hand, max difficulty when using left hand.    Manual Therapy   Manual Therapy Passive ROM   Passive ROM PROM completed to Left hand digits to decrease spaticity and increase functional use during daily tasks.                   OT Short Term Goals - 04/13/15 1540    OT SHORT TERM GOAL #1   Title Patient will be educated and independent with HEP.   Status On-going   OT SHORT TERM GOAL #2   Title Patient will increase AROM of left hand by 5 degrees to increase ability to use left hand during daily tasks.    Status On-going   OT SHORT TERM GOAL #3   Title Therapist will fabricate finger extension splint to defer the chance of progressing spaticity.    Status Achieved   OT SHORT TERM GOAL #4   Title Patient will increase coordination in Bil hands by decreasing completion time of  9 hole peg test by 10 seconds.    Status Achieved   OT SHORT TERM GOAL #5    Title Patient will increase grip strength by 5# and pinch strength by 3#  of left hand to increase ability to use left hand as an active assist during daily tasks.    Status On-going           OT Long Term Goals - 04/13/15 1541    OT LONG TERM GOAL #1   Title Patient will return to highest level of independence with all daily activities.    Status On-going   OT  LONG TERM GOAL #2   Title Patient will increase AROM of left hand by 10 degrees.   Status On-going   OT LONG TERM GOAL #3   Title Patient will increase PROM of left hand to [facility] to decrease the progression of spaticity   Status On-going   OT LONG TERM GOAL #4   Title Patient will increase grip strength by 10# and pinch strength by 5# in LUE.   Status On-going   OT LONG TERM GOAL #5   Title Patient will increase Bil coordination by decreasing completion time of 9 hole peg test by 30 seconds.    Status Partially Met               Plan - 04/28/15 1646    Clinical Impression Statement A: Pt completed velcro board task requiring composite finger extension, fine motor coordination task using tweezers. Pt had mod-max difficulty with tweezer task, requiring extended time to complete, unable to complete full pattern.    Plan P: Resume grip/pinch strengthening, continue with [facility] tasks.         Problem List Patient Active Problem List   Diagnosis Date Noted  . Primary hypercoagulable state 03/19/2015  . Chronic respiratory failure 03/19/2015  . Insomnia 02/02/2015  . MRSA pneumonia 10/11/2014  . Abdominal abscess   . Candidemia   . Screen for STD (sexually transmitted disease)   . 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 Perforated gastric ulcer 09/27/2014  . Prediabetes 07/23/2014  . Unspecified hereditary and idiopathic peripheral neuropathy 04/15/2014  . CVA (cerebral infarction) 04/02/2014  . Hypertriglyceridemia 12/11/2013  . Stiffness of joint, not elsewhere classified, ankle and foot 11/12/2013  . Lack of  coordination 11/03/2013  . Pernicious anemia 09/12/2013  . Elevated transaminase level 09/12/2013  . Ataxia 09/09/2013  . Difficulty in walking(719.7) 09/09/2013  . Weakness of both legs 09/09/2013  . Generalized weakness 02/11/2013  . Tobacco abuse 01/14/2013  . Pedal edema 01/13/2013  . Chronic pain syndrome 12/30/2012  . Reactive airway disease 12/30/2012    Guadelupe Sabin, OTR/L  [phone]  04/28/2015, 4:51 PM  [place] [address] [place], [place], [address] Phone: [phone]   Fax:  [phone]

## 2015-04-28 NOTE — Therapy (Signed)
[address] [place], [place], [address] Phone: [phone]   Fax:  [phone]  Physical Therapy Treatment  Patient Details  Name: [name] MRN: [id number] Date of Birth: 06-08-1966 Referring Provider:  Kathyrn Drown, MD  Encounter Date: 04/28/2015      PT End of Session - 04/28/15 1440    Visit Number 18   Number of Visits 24   Date for PT Re-Evaluation 06/06/15   Authorization Type Medicare    Authorization Time Period 04/06/15-06/06/15; G-code done 13th visit    Authorization - Visit Number 18   Authorization - Number of Visits 24   PT Start Time 1400   PT Stop Time 1430   PT Time Calculation (min) 30 min   Equipment Utilized During Treatment Gait belt   Activity Tolerance Patient tolerated treatment well   Behavior During Therapy [facility] for tasks assessed/performed      Past Medical History  Diagnosis Date  . Anxiety   . Depression   . COPD (chronic obstructive pulmonary disease)   . MS (multiple sclerosis)   . Fibromyalgia   . Impaired fasting glucose   . 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 Eating disorder   . Chronic low back pain   . PONV (postoperative nausea and vomiting)   . Chronic bronchitis     "yearly" (02/11/2013)  . Borderline diabetes   . GERD (gastroesophageal reflux disease)   . Migraine     "I use to" (02/11/2013)  . Arthritis     "hands & feet" (02/11/2013)  . Peripheral neuropathy     [name] 02/11/2013  . B12 deficiency anemia     [name] 02/11/2013  . Chronic pain syndrome     [name] 02/11/2013  . UTI (lower urinary tract infection) 02/11/2013    [name] 02/11/2013  . Chronic edema     BLE/notes 02/11/2013  . Ataxia   . Peripheral neuropathy   . Muscle weakness of lower extremity     bilateral  . Hypertension     Past Surgical History  Procedure Laterality Date  . Abdominal hysterectomy  ~ 1987; ~ 2004    "woodward; ferguson" (02/11/2013)  . Cesarean  section  9767; 1984; 1986  . Anterior cervical decomp/discectomy fusion  2009; 2011  . Tonsillectomy  1990's?  . Cataract extraction w/phaco  06/20/2011    Procedure: CATARACT EXTRACTION PHACO AND INTRAOCULAR LENS PLACEMENT (IOC);  Surgeon: [name] T. Gershon Crane;  [facility]: [facility];  Service: Ophthalmology;  Laterality: Left;  CDE 1.81  . Cataract extraction w/phaco  07/04/2011    Procedure: CATARACT EXTRACTION PHACO AND INTRAOCULAR LENS PLACEMENT (IOC);  Surgeon: [name] T. Gershon Crane;  Location: [facility];  Service: Ophthalmology;  Laterality: Right;  CDE: 1.76  . Yag laser application Right 3/41/9379    Procedure: YAG LASER APPLICATION;  Surgeon: [name] T. Gershon Crane, MD;  Location: [facility];  Service: Ophthalmology;  Laterality: Right;  . Yag laser application Left 0/24/0973    Procedure: YAG LASER APPLICATION;  Surgeon: [name] T. Gershon Crane, MD;  Location: [facility];  Service: Ophthalmology;  Laterality: Left;  . Appendectomy    . Laparotomy N/A 09/27/2014    Procedure: Exploratory Laparotomy, Biopsy of Perforated Gastric Ulcer, Closure with omental patch;  Surgeon: [name], MD;  Location: [facility];  Service: General;  Laterality: N/A;  . Embolectomy Left 09/27/2014    Procedure: Left Brachial, Radial,Ulnar Embolectomy with patch angioplasty left brachial, radial and ulnar artery;  Surgeon:  [name], MD;  Location: [facility];  Service: Vascular;  Laterality: Left;  . Embolectomy Left 09/27/2014    Procedure: Left Radial, Brachial, and Ulnar Thrombectomy; Left Brachial to Radial Bypass Graft using Greater Saphenous vein graft from Left Thigh; Left Saphenous Vein Harvest; Intraoperative Arteriogram; Intra-arterial administration of TPA;  Surgeon: [name], MD;  Location: [facility] OR;  Service: Vascular;  Laterality: Left;  . Tracheostomy N/A december 2015    There were no vitals filed for this visit.  Visit Diagnosis:  Lack of coordination  Abnormality of gait  Weakness of both legs  Impaired functional  mobility and activity tolerance  Ataxia  Proximal muscle weakness  Poor posture      Subjective Assessment - 04/28/15 1437    Subjective Pt late for apt today.  Reports she continues to have pain in her hands, legs and feet pain scale 8/10   Currently in Pain? Yes   Pain Score 8    Pain Location Generalized  hands, feet and legs   Pain Orientation Right;Left   Pain Descriptors / Indicators Aching;Constant               [facility] Adult PT Treatment/Exercise - 04/28/15 0001    Ambulation/Gait   Ambulation/Gait Yes   Ambulation/Gait Assistance 4: Min guard   Ambulation/Gait Assistance Details 4# on RW   Ambulation Distance (Feet) --  2x 60 feet   Assistive device Rolling walker   Exercises   Exercises Knee/Hip   Knee/Hip Exercises: Stretches   Piriformis Stretch 3 reps;30 seconds   Piriformis Stretch Limitations supine manual figure 4   Gastroc Stretch 30 seconds;3 reps   Gastroc Stretch Limitations passive in supine   Knee/Hip Exercises: Aerobic   Tread Mill 0.4 with weight bearing support 3 sets: 2', 2', 3'                   PT Short Term Goals - 04/28/15 1502    PT SHORT TERM GOAL #1   Title Patient will demonstrate a grade of at least 3+/5 in proximal musculature and core in order to promote functional stability and safe mobility skills    Status On-going   PT SHORT TERM GOAL #2   Title Patient will be able to participate in standing tasks with Min(A) from therapist and weights on limbs to reduce ataxia symptoms for at least 7 minutes at a time    Status On-going   PT SHORT TERM GOAL #3   Title Patient to experience no more than 4/10 at worst on a daily basis at rest and during mobility    Status On-going   PT SHORT TERM GOAL #4   Title Patient will demonstrate the ability to ambulate at least 2ft x6 in body weight support system with weighted extremities and minimal fatigue, good gait mechanics throughout    Status On-going   PT SHORT TERM GOAL #5    Title Patient will be able to perform functional transfers, including sit<->stand and stand<->pivot from wheelchair to bed with close supervision and no loss of balance    Status On-going   PT SHORT TERM GOAL #6   Title Patient will demonstrate improved posture while seated in wheelchair and will be able to state and demonstrate the importance of performing appropriate pressure relieving techniques consistently    Status On-going           PT Long Term Goals - 04/28/15 1503    PT LONG TERM GOAL #1  Title Patient will be independent in safely performed advanced HEP with assistance from caregivers as needed, updated PRN    PT LONG TERM GOAL #2   Title Patient will demonstrate at least 4/5 to 4+/5 muscle strength in all proximal muscles, bilateral lower extremities, and core    PT LONG TERM GOAL #3   Title Patient will demonstrate bilateral ankle DF of at least 10 degrees in order to improve overall gait mechanics and functional mobility skills    PT LONG TERM GOAL #4   Title Patient will demonstrate the ability to ambulate at least 153ft with LRAD, no more than distant supervision, no loss of balance, and minimal fatigue throughout    PT LONG TERM GOAL #5   Title Patient will be independent in performing functional transfers, including all bed mobility skills and sit<->stand and stand pivot transfers with Mod(I) and LRAD    PT LONG TERM GOAL #6   Title Patient will demonstrate the ability to stand at least 15 minutes before fatiguing and with good posture and minimal loss of balance    PT LONG TERM GOAL #7   Title Patient will regularly perform weight bearing activities at home with assistance from caregivers as needed in order to protect skin and reduce detrimental effects of being sedentary                Plan - 04/28/15 1441    Clinical Impression Statement Treatment focus on improving gait mechanics, began gait training on treadmill with weight bearing support for safety to  improve gait mechanics at set cadence with verbal and tactile cueing to improve multiple gait deficits.  Pt limited by fatigue following 2 minutes, seated rest breaks required due to fatigue.  Pt stated pain reduced at end of session   PT Next Visit Plan Continue with stretching of plantarflexors, strengthening of R dorsiflexion, gait training, and standing BLE strengthening to improve standing tolerance.  Continue gait training with treadmill for set cadence        Problem List Patient Active Problem List   Diagnosis Date Noted  . Primary hypercoagulable state 03/19/2015  . Chronic respiratory failure 03/19/2015  . Insomnia 02/02/2015  . MRSA pneumonia 10/11/2014  . Abdominal abscess   . Candidemia   . Screen for STD (sexually transmitted disease)   . 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 Perforated gastric ulcer 09/27/2014  . Prediabetes 07/23/2014  . Unspecified hereditary and idiopathic peripheral neuropathy 04/15/2014  . CVA (cerebral infarction) 04/02/2014  . Hypertriglyceridemia 12/11/2013  . Stiffness of joint, not elsewhere classified, ankle and foot 11/12/2013  . Lack of coordination 11/03/2013  . Pernicious anemia 09/12/2013  . Elevated transaminase level 09/12/2013  . Ataxia 09/09/2013  . Difficulty in walking(719.7) 09/09/2013  . Weakness of both legs 09/09/2013  . Generalized weakness 02/11/2013  . Tobacco abuse 01/14/2013  . Pedal edema 01/13/2013  . Chronic pain syndrome 12/30/2012  . Reactive airway disease 12/30/2012   Ihor Austin, [place]; [place]  [name] 04/28/2015, 3:05 PM  [place] [address] [place], [place], [address] Phone: [phone]   Fax:  [phone]

## 2015-04-30 ENCOUNTER — Encounter (HOSPITAL_COMMUNITY): Payer: Self-pay | Admitting: Occupational Therapy

## 2015-04-30 ENCOUNTER — Ambulatory Visit (HOSPITAL_COMMUNITY): Payer: BLUE CROSS/BLUE SHIELD | Admitting: Physical Therapy

## 2015-04-30 ENCOUNTER — Ambulatory Visit (HOSPITAL_COMMUNITY): Payer: BLUE CROSS/BLUE SHIELD | Admitting: Occupational Therapy

## 2015-04-30 DIAGNOSIS — M6281 Muscle weakness (generalized): Secondary | ICD-10-CM

## 2015-04-30 DIAGNOSIS — R279 Unspecified lack of coordination: Secondary | ICD-10-CM

## 2015-04-30 DIAGNOSIS — Z7409 Other reduced mobility: Secondary | ICD-10-CM

## 2015-04-30 DIAGNOSIS — R270 Ataxia, unspecified: Secondary | ICD-10-CM

## 2015-04-30 DIAGNOSIS — M25642 Stiffness of left hand, not elsewhere classified: Secondary | ICD-10-CM

## 2015-04-30 DIAGNOSIS — R293 Abnormal posture: Secondary | ICD-10-CM

## 2015-04-30 DIAGNOSIS — R29898 Other symptoms and signs involving the musculoskeletal system: Secondary | ICD-10-CM

## 2015-04-30 DIAGNOSIS — R269 Unspecified abnormalities of gait and mobility: Secondary | ICD-10-CM

## 2015-04-30 DIAGNOSIS — M6289 Other specified disorders of muscle: Secondary | ICD-10-CM | POA: Diagnosis not present

## 2015-04-30 DIAGNOSIS — M79642 Pain in left hand: Secondary | ICD-10-CM

## 2015-04-30 NOTE — Therapy (Signed)
[place], [place], [address] Phone: [phone]   Fax:  [phone]  Physical Therapy Treatment  Patient Details  Name: [name] MRN: [id number] Date of Birth: 1966/05/27 Referring Provider:  Kathyrn Drown, MD  Encounter Date: 04/30/2015      PT End of Session - 04/30/15 1645    Visit Number 19   Number of Visits 24   Date for PT Re-Evaluation 06/06/15   Authorization Type Medicare    Authorization Time Period 04/06/15-06/06/15; G-code done 13th visit    Authorization - Visit Number 44   Authorization - Number of Visits 24   PT Start Time 1430   PT Stop Time 1513   PT Time Calculation (min) 43 min   Equipment Utilized During Treatment Gait belt   Activity Tolerance Patient tolerated treatment well   Behavior During Therapy [facility] for tasks assessed/performed      Past Medical History  Diagnosis Date  . Anxiety   . Depression   . COPD (chronic obstructive pulmonary disease)   . MS (multiple sclerosis)   . Fibromyalgia   . Impaired fasting glucose   . 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 Eating disorder   . Chronic low back pain   . PONV (postoperative nausea and vomiting)   . Chronic bronchitis     "yearly" (02/11/2013)  . Borderline diabetes   . GERD (gastroesophageal reflux disease)   . Migraine     "I use to" (02/11/2013)  . Arthritis     "hands & feet" (02/11/2013)  . Peripheral neuropathy     [name] 02/11/2013  . B12 deficiency anemia     [name] 02/11/2013  . Chronic pain syndrome     [name] 02/11/2013  . UTI (lower urinary tract infection) 02/11/2013    [name] 02/11/2013  . Chronic edema     BLE/notes 02/11/2013  . Ataxia   . Peripheral neuropathy   . Muscle weakness of lower extremity     bilateral  . Hypertension     Past Surgical History  Procedure Laterality Date  . Abdominal hysterectomy  ~ 1987; ~ 2004    "woodward; ferguson" (02/11/2013)  . Cesarean  section  6767; 1984; 1986  . Anterior cervical decomp/discectomy fusion  2009; 2011  . Tonsillectomy  1990's?  . Cataract extraction w/phaco  06/20/2011    Procedure: CATARACT EXTRACTION PHACO AND INTRAOCULAR LENS PLACEMENT (IOC);  Surgeon: [name] T. Gershon Crane;  [facility];  Service: Ophthalmology;  Laterality: Left;  CDE 1.81  . Cataract extraction w/phaco  07/04/2011    Procedure: CATARACT EXTRACTION PHACO AND INTRAOCULAR LENS PLACEMENT (IOC);  Surgeon: [name] T. Gershon Crane;  Location: [facility];  Service: Ophthalmology;  Laterality: Right;  CDE: 1.76  . Yag laser application Right 11/18/4707    Procedure: YAG LASER APPLICATION;  Surgeon: [name] T. Gershon Crane, MD;  Location: [facility];  Service: Ophthalmology;  Laterality: Right;  . Yag laser application Left 04/05/3661    Procedure: YAG LASER APPLICATION;  Surgeon: [name] T. Gershon Crane, MD;  Location: [facility];  Service: Ophthalmology;  Laterality: Left;  . Appendectomy    . Laparotomy N/A 09/27/2014    Procedure: Exploratory Laparotomy, Biopsy of Perforated Gastric Ulcer, Closure with omental patch;  Surgeon: [name], MD;  Location: [facility];  Service: General;  Laterality: N/A;  . Embolectomy Left 09/27/2014    Procedure: Left Brachial, Radial,Ulnar Embolectomy with patch angioplasty left brachial, radial and ulnar artery;  Surgeon:  [name], MD;  Location: [facility] OR;  Service: Vascular;  Laterality: Left;  . Embolectomy Left 09/27/2014    Procedure: Left Radial, Brachial, and Ulnar Thrombectomy; Left Brachial to Radial Bypass Graft using Greater Saphenous vein graft from Left Thigh; Left Saphenous Vein Harvest; Intraoperative Arteriogram; Intra-arterial administration of TPA;  Surgeon: [name], MD;  Location: [facility] OR;  Service: Vascular;  Laterality: Left;  . Tracheostomy N/A december 2015    There were no vitals filed for this visit.  Visit Diagnosis:  Lack of coordination  Abnormality of gait  Weakness of both legs  Impaired functional  mobility and activity tolerance  Ataxia  Proximal muscle weakness  Poor posture      Subjective Assessment - 04/30/15 1642    Subjective patient very pleasant, states she is doing well today and feeling much better    Currently in Pain? Yes   Pain Score 7    Pain Location Generalized   Pain Orientation Right;Left                         [facility] Adult PT Treatment/Exercise - 04/30/15 1643    Knee/Hip Exercises: Stretches   Passive Hamstring Stretch Both;2 reps;30 seconds   Passive Hamstring Stretch Limitations supine    Piriformis Stretch Both;1 rep;60 seconds   Piriformis Stretch Limitations supine    Gastroc Stretch Both;3 reps;30 seconds   Knee/Hip Exercises: Standing   Heel Raises 15 reps   Heel Raises Limitations parallel bars    Hip Abduction Both;1 set;10 reps   Abduction Limitations parallel bars   Other Standing Knee Exercises standing weight shifts in parallel bars    Other Standing Knee Exercises sit to stand with staggered stance 1x10 each side; 3d hip excursions; minisquats in parallel bars                 PT Education - 04/30/15 1645    Education provided No          PT Short Term Goals - 04/28/15 1502    PT SHORT TERM GOAL #1   Title Patient will demonstrate a grade of at least 3+/5 in proximal musculature and core in order to promote functional stability and safe mobility skills    Status On-going   PT SHORT TERM GOAL #2   Title Patient will be able to participate in standing tasks with Min(A) from therapist and weights on limbs to reduce ataxia symptoms for at least 7 minutes at a time    Status On-going   PT SHORT TERM GOAL #3   Title Patient to experience no more than 4/10 at worst on a daily basis at rest and during mobility    Status On-going   PT SHORT TERM GOAL #4   Title Patient will demonstrate the ability to ambulate at least 41ft x6 in body weight support system with weighted extremities and minimal fatigue, good  gait mechanics throughout    Status On-going   PT SHORT TERM GOAL #5   Title Patient will be able to perform functional transfers, including sit<->stand and stand<->pivot from wheelchair to bed with close supervision and no loss of balance    Status On-going   PT SHORT TERM GOAL #6   Title Patient will demonstrate improved posture while seated in wheelchair and will be able to state and demonstrate the importance of performing appropriate pressure relieving techniques consistently    Status On-going  PT Long Term Goals - 04/28/15 1503    PT LONG TERM GOAL #1   Title Patient will be independent in safely performed advanced HEP with assistance from caregivers as needed, updated PRN    PT LONG TERM GOAL #2   Title Patient will demonstrate at least 4/5 to 4+/5 muscle strength in all proximal muscles, bilateral lower extremities, and core    PT LONG TERM GOAL #3   Title Patient will demonstrate bilateral ankle DF of at least 10 degrees in order to improve overall gait mechanics and functional mobility skills    PT LONG TERM GOAL #4   Title Patient will demonstrate the ability to ambulate at least 160ft with LRAD, no more than distant supervision, no loss of balance, and minimal fatigue throughout    PT LONG TERM GOAL #5   Title Patient will be independent in performing functional transfers, including all bed mobility skills and sit<->stand and stand pivot transfers with Mod(I) and LRAD    PT LONG TERM GOAL #6   Title Patient will demonstrate the ability to stand at least 15 minutes before fatiguing and with good posture and minimal loss of balance    PT LONG TERM GOAL #7   Title Patient will regularly perform weight bearing activities at home with assistance from caregivers as needed in order to protect skin and reduce detrimental effects of being sedentary                Plan - 04/30/15 1645    Clinical Impression Statement Focused on standing tolerance and standing  exercises today with good tolerance by patient. Provided blocks to knees in parallel bars as well as facilitations for good form with exercises. Patient did fatigue with exercises but maintained good form throughout session.    Pt will benefit from skilled therapeutic intervention in order to improve on the following deficits Abnormal gait;Decreased coordination;Decreased range of motion;Difficulty walking;Impaired flexibility;Improper body mechanics;Decreased endurance;Decreased safety awareness;Impaired sensation;Postural dysfunction;Decreased activity tolerance;Decreased balance;Decreased knowledge of use of DME;Pain;Decreased mobility;Decreased strength   Rehab Potential Good   Clinical Impairments Affecting Rehab Potential chronic ataxia symptoms however patient has reportedly been able to return to walking after other bouts of illness    PT Frequency 2x / week   PT Duration 8 weeks   PT Treatment/Interventions ADLs/Self Care Home Management;Gait training;Neuromuscular re-education;Visual/perceptual remediation/compensation;Stair training;Biofeedback;Functional mobility training;Patient/family education;Passive range of motion;Cryotherapy;Therapeutic activities;Wheelchair mobility training;Electrical Stimulation;Therapeutic exercise;Manual techniques;Energy conservation;DME Instruction;Balance training   PT Next Visit Plan Continue with stretching of plantarflexors, strengthening of R dorsiflexion, gait training, and standing BLE strengthening to improve standing tolerance.  Continue gait training with treadmill for set cadence   PT Home Exercise Plan No updates   Consulted and Agree with Plan of Care Patient        Problem List Patient Active Problem List   Diagnosis Date Noted  . Primary hypercoagulable state 03/19/2015  . Chronic respiratory failure 03/19/2015  . Insomnia 02/02/2015  . MRSA pneumonia 10/11/2014  . Abdominal abscess   . Candidemia   . Screen for STD (sexually  transmitted disease)   . 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 Perforated gastric ulcer 09/27/2014  . Prediabetes 07/23/2014  . Unspecified hereditary and idiopathic peripheral neuropathy 04/15/2014  . CVA (cerebral infarction) 04/02/2014  . Hypertriglyceridemia 12/11/2013  . Stiffness of joint, not elsewhere classified, ankle and foot 11/12/2013  . Lack of coordination 11/03/2013  . Pernicious anemia 09/12/2013  . Elevated transaminase level 09/12/2013  . Ataxia 09/09/2013  . Difficulty  in walking(719.7) 09/09/2013  . Weakness of both legs 09/09/2013  . Generalized weakness 02/11/2013  . Tobacco abuse 01/14/2013  . Pedal edema 01/13/2013  . Chronic pain syndrome 12/30/2012  . Reactive airway disease 12/30/2012    Deniece Ree PT, DPT [facility] [address] [place], [place], [address] Phone: [phone]   Fax:  [phone]

## 2015-04-30 NOTE — Therapy (Signed)
[place], [place], [address] Phone: [phone]   Fax:  [phone]  Occupational Therapy Treatment  Patient Details  Name: [name] MRN: [id number] Date of Birth: 06-16-1966 Referring Provider:  Kathyrn Drown, MD  Encounter Date: 04/30/2015      OT End of Session - 04/30/15 1627    Visit Number 12   Number of Visits 16   Date for OT Re-Evaluation 05/10/15   Authorization Type Medicare A   Authorization Time Period before 20th visit   Authorization - Visit Number 12   Authorization - Number of Visits 20   OT Start Time 1355   OT Stop Time 1430   OT Time Calculation (min) 35 min   Activity Tolerance Patient tolerated treatment well   Behavior During Therapy [facility] for tasks assessed/performed      Past Medical History  Diagnosis Date  . Anxiety   . Depression   . COPD (chronic obstructive pulmonary disease)   . MS (multiple sclerosis)   . Fibromyalgia   . Impaired fasting glucose   . 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 Eating disorder   . Chronic low back pain   . PONV (postoperative nausea and vomiting)   . Chronic bronchitis     "yearly" (02/11/2013)  . Borderline diabetes   . GERD (gastroesophageal reflux disease)   . Migraine     "I use to" (02/11/2013)  . Arthritis     "hands & feet" (02/11/2013)  . Peripheral neuropathy     [name] 02/11/2013  . B12 deficiency anemia     [name] 02/11/2013  . Chronic pain syndrome     [name] 02/11/2013  . UTI (lower urinary tract infection) 02/11/2013    [name] 02/11/2013  . Chronic edema     BLE/notes 02/11/2013  . Ataxia   . Peripheral neuropathy   . Muscle weakness of lower extremity     bilateral  . Hypertension     Past Surgical History  Procedure Laterality Date  . Abdominal hysterectomy  ~ 1987; ~ 2004    "woodward; ferguson" (02/11/2013)  . Cesarean section  7867; 1984; 1986  . Anterior cervical decomp/discectomy  fusion  2009; 2011  . Tonsillectomy  1990's?  . Cataract extraction w/phaco  06/20/2011    Procedure: CATARACT EXTRACTION PHACO AND INTRAOCULAR LENS PLACEMENT (IOC);  Surgeon: [name] T. Gershon Crane;  Location: [facility];  Service: Ophthalmology;  Laterality: Left;  CDE 1.81  . Cataract extraction w/phaco  07/04/2011    Procedure: CATARACT EXTRACTION PHACO AND INTRAOCULAR LENS PLACEMENT (IOC);  Surgeon: [name] T. Gershon Crane;  Location: [facility];  Service: Ophthalmology;  Laterality: Right;  CDE: 1.76  . Yag laser application Right 6/72/0947    Procedure: YAG LASER APPLICATION;  Surgeon: [name] T. Gershon Crane, MD;  Location: [facility];  Service: Ophthalmology;  Laterality: Right;  . Yag laser application Left 0/96/2836    Procedure: YAG LASER APPLICATION;  Surgeon: [name] T. Gershon Crane, MD;  Location: [facility];  Service: Ophthalmology;  Laterality: Left;  . Appendectomy    . Laparotomy N/A 09/27/2014    Procedure: Exploratory Laparotomy, Biopsy of Perforated Gastric Ulcer, Closure with omental patch;  Surgeon: [name], MD;  Location: [place];  Service: General;  Laterality: N/A;  . Embolectomy Left 09/27/2014    Procedure: Left Brachial, Radial,Ulnar Embolectomy with patch angioplasty left brachial, radial and ulnar artery;  Surgeon: [name], MD;  Location: [place];  Service: Vascular;  Laterality: Left;  . Embolectomy Left 09/27/2014    Procedure: Left Radial, Brachial, and Ulnar Thrombectomy; Left Brachial to Radial Bypass Graft using Greater Saphenous vein graft from Left Thigh; Left Saphenous Vein Harvest; Intraoperative Arteriogram; Intra-arterial administration of TPA;  Surgeon: [name], MD;  [facility];  Service: Vascular;  Laterality: Left;  . Tracheostomy N/A december 2015    There were no vitals filed for this visit.  Visit Diagnosis:  Lack of coordination  Hand joint pain, left  Hand muscle weakness  Stiffness of hand joint, left      Subjective Assessment - 04/30/15 1354     Subjective  S: Everything is hurting today.    Currently in Pain? Yes   Pain Score 7    Pain Location Generalized   Pain Orientation Right;Left   Pain Descriptors / Indicators Aching;Constant   Pain Type Acute pain            [facility] OT Assessment - 04/30/15 1354    Assessment   Diagnosis left hand spaticity   Precautions   Precautions Fall                  OT Treatments/Exercises (OP) - 04/30/15 1406    Exercises   Exercises Hand;Wrist   Wrist Exercises   Forearm Supination PROM;Strengthening;10 reps   Bar Weights/Barbell (Forearm Supination) 1 lb   Forearm Pronation PROM;Strengthening;10 reps   Bar Weights/Barbell (Forearm Pronation) 1 lb   Wrist Flexion PROM;Strengthening;10 reps   Bar Weights/Barbell (Wrist Flexion) 1 lb   Wrist Extension PROM;Strengthening;10 reps   Bar Weights/Barbell (Wrist Extension) 1 lb   Wrist Radial Deviation PROM;Strengthening;10 reps   Bar Weights/Barbell (Radial Deviation) 1 lb   Wrist Ulnar Deviation PROM;Strengthening;10 reps   Bar Weights/Barbell (Ulnar Deviation) 1 lb   Additional Wrist Exercises   Hand Gripper with Large Beads 6/6 beads gripper set at 7#-focusing on pinch strength   Hand Exercises   MCPJ Flexion PROM;AROM;10 reps   MCPJ Extension PROM;AROM;10 reps   Fine Motor Coordination   Fine Motor Coordination Grooved pegs   Grooved pegs Pt attempted to complete grooved pegboard. Pt completed 7/12 grooved pegs with right hand 5/12 with left hand. Pt had mod-max difficulty with task, requiring extended time to complete   Manual Therapy   Manual Therapy Passive ROM   Passive ROM PROM completed to Left hand digits to decrease spaticity and increase functional use during daily tasks.                   OT Short Term Goals - 04/13/15 1540    OT SHORT TERM GOAL #1   Title Patient will be educated and independent with HEP.   Status On-going   OT SHORT TERM GOAL #2   Title Patient will increase AROM of left  hand by 5 degrees to increase ability to use left hand during daily tasks.    Status On-going   OT SHORT TERM GOAL #3   Title Therapist will fabricate finger extension splint to defer the chance of progressing spaticity.    Status Achieved   OT SHORT TERM GOAL #4   Title Patient will increase coordination in Bil hands by decreasing completion time of  9 hole peg test by 10 seconds.    Status Achieved   OT SHORT TERM GOAL #5   Title Patient will increase grip strength by 5# and pinch strength by 3#  of left hand to increase ability to use left hand  as an active assist during daily tasks.    Status On-going           OT Long Term Goals - 04/13/15 1541    OT LONG TERM GOAL #1   Title Patient will return to highest level of independence with all daily activities.    Status On-going   OT LONG TERM GOAL #2   Title Patient will increase AROM of left hand by 10 degrees.   Status On-going   OT LONG TERM GOAL #3   Title Patient will increase PROM of left hand to [facility] to decrease the progression of spaticity   Status On-going   OT LONG TERM GOAL #4   Title Patient will increase grip strength by 10# and pinch strength by 5# in LUE.   Status On-going   OT LONG TERM GOAL #5   Title Patient will increase Bil coordination by decreasing completion time of 9 hole peg test by 30 seconds.    Status Partially Met               Plan - 04/30/15 1628    Clinical Impression Statement A: Pt arrived late to session. Pt completed grooved pegboard task, pinch strengthening task, resumed wrist exercises & added 1# weight. Pt had mod-max difficulty with grooved pegboard task, verbal cuing required to turn pegs to match grooved holes. Pt tolerated treatment well.    Plan P: Cont grip strengthening, [place] tasks.         Problem List Patient Active Problem List   Diagnosis Date Noted  . Primary hypercoagulable state 03/19/2015  . Chronic respiratory failure 03/19/2015  . Insomnia 02/02/2015  .  MRSA pneumonia 10/11/2014  . Abdominal abscess   . Candidemia   . Screen for STD (sexually transmitted disease)   . 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 Perforated gastric ulcer 09/27/2014  . Prediabetes 07/23/2014  . Unspecified hereditary and idiopathic peripheral neuropathy 04/15/2014  . CVA (cerebral infarction) 04/02/2014  . Hypertriglyceridemia 12/11/2013  . Stiffness of joint, not elsewhere classified, ankle and foot 11/12/2013  . Lack of coordination 11/03/2013  . Pernicious anemia 09/12/2013  . Elevated transaminase level 09/12/2013  . Ataxia 09/09/2013  . Difficulty in walking(719.7) 09/09/2013  . Weakness of both legs 09/09/2013  . Generalized weakness 02/11/2013  . Tobacco abuse 01/14/2013  . Pedal edema 01/13/2013  . Chronic pain syndrome 12/30/2012  . Reactive airway disease 12/30/2012    Guadelupe Sabin, OTR/L  [phone]  04/30/2015, 4:31 PM  [facility] [address] [place], [place], [address] Phone: [phone]   Fax:  [phone]

## 2015-05-04 ENCOUNTER — Ambulatory Visit (HOSPITAL_COMMUNITY): Payer: BLUE CROSS/BLUE SHIELD | Admitting: Physical Therapy

## 2015-05-04 ENCOUNTER — Encounter (HOSPITAL_COMMUNITY): Payer: Self-pay

## 2015-05-04 ENCOUNTER — Ambulatory Visit (HOSPITAL_COMMUNITY): Payer: BLUE CROSS/BLUE SHIELD

## 2015-05-04 DIAGNOSIS — M79642 Pain in left hand: Secondary | ICD-10-CM

## 2015-05-04 DIAGNOSIS — M25642 Stiffness of left hand, not elsewhere classified: Secondary | ICD-10-CM

## 2015-05-04 DIAGNOSIS — R29898 Other symptoms and signs involving the musculoskeletal system: Secondary | ICD-10-CM

## 2015-05-04 DIAGNOSIS — M6281 Muscle weakness (generalized): Secondary | ICD-10-CM

## 2015-05-04 DIAGNOSIS — Z7409 Other reduced mobility: Secondary | ICD-10-CM

## 2015-05-04 DIAGNOSIS — R279 Unspecified lack of coordination: Secondary | ICD-10-CM

## 2015-05-04 DIAGNOSIS — R293 Abnormal posture: Secondary | ICD-10-CM

## 2015-05-04 DIAGNOSIS — R270 Ataxia, unspecified: Secondary | ICD-10-CM

## 2015-05-04 DIAGNOSIS — G709 Myoneural disorder, unspecified: Secondary | ICD-10-CM

## 2015-05-04 DIAGNOSIS — R471 Dysarthria and anarthria: Secondary | ICD-10-CM

## 2015-05-04 DIAGNOSIS — M6289 Other specified disorders of muscle: Secondary | ICD-10-CM | POA: Diagnosis not present

## 2015-05-04 DIAGNOSIS — R269 Unspecified abnormalities of gait and mobility: Secondary | ICD-10-CM

## 2015-05-04 NOTE — Therapy (Signed)
[place], [place], [address] Phone: [phone]   Fax:  [phone]  Occupational Therapy Treatment  Patient Details  Name: [name] MRN: [id number] Date of Birth: Mar 02, 1966 Referring Provider:  Kathyrn Drown, MD  Encounter Date: 05/04/2015      OT End of Session - 05/04/15 1505    Visit Number 13   Number of Visits 16   Date for OT Re-Evaluation 05/10/15   Authorization Type Medicare A   Authorization Time Period before 20th visit   Authorization - Visit Number 22   Authorization - Number of Visits 20   OT Start Time 1345   OT Stop Time 1430   OT Time Calculation (min) 45 min   Activity Tolerance Patient tolerated treatment well   Behavior During Therapy [facility] for tasks assessed/performed      Past Medical History  Diagnosis Date  . Anxiety   . Depression   . COPD (chronic obstructive pulmonary disease)   . MS (multiple sclerosis)   . Fibromyalgia   . Impaired fasting glucose   . 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 Eating disorder   . Chronic low back pain   . PONV (postoperative nausea and vomiting)   . Chronic bronchitis     "yearly" (02/11/2013)  . Borderline diabetes   . GERD (gastroesophageal reflux disease)   . Migraine     "I use to" (02/11/2013)  . Arthritis     "hands & feet" (02/11/2013)  . Peripheral neuropathy     [name] 02/11/2013  . B12 deficiency anemia     [name] 02/11/2013  . Chronic pain syndrome     [name] 02/11/2013  . UTI (lower urinary tract infection) 02/11/2013    [name] 02/11/2013  . Chronic edema     BLE/notes 02/11/2013  . Ataxia   . Peripheral neuropathy   . Muscle weakness of lower extremity     bilateral  . Hypertension     Past Surgical History  Procedure Laterality Date  . Abdominal hysterectomy  ~ 1987; ~ 2004    "woodward; ferguson" (02/11/2013)  . Cesarean section  0177; 1984; 1986  . Anterior cervical decomp/discectomy  fusion  2009; 2011  . Tonsillectomy  1990's?  . Cataract extraction w/phaco  06/20/2011    Procedure: CATARACT EXTRACTION PHACO AND INTRAOCULAR LENS PLACEMENT (IOC);  Surgeon: [name] T. Gershon Crane;  [facility];  Service: Ophthalmology;  Laterality: Left;  CDE 1.81  . Cataract extraction w/phaco  07/04/2011    Procedure: CATARACT EXTRACTION PHACO AND INTRAOCULAR LENS PLACEMENT (IOC);  Surgeon: [name] T. Gershon Crane;  [facility]: [facility];  Service: Ophthalmology;  Laterality: Right;  CDE: 1.76  . Yag laser application Right 9/39/0300    Procedure: YAG LASER APPLICATION;  Surgeon: [name] T. Gershon Crane, MD;  Location: [facility];  Service: Ophthalmology;  Laterality: Right;  . Yag laser application Left 07/01/3006    Procedure: YAG LASER APPLICATION;  Surgeon: [name] T. Gershon Crane, MD;  Location: [facility];  Service: Ophthalmology;  Laterality: Left;  . Appendectomy    . Laparotomy N/A 09/27/2014    Procedure: Exploratory Laparotomy, Biopsy of Perforated Gastric Ulcer, Closure with omental patch;  Surgeon: [name], MD;  Location: [place];  Service: General;  Laterality: N/A;  . Embolectomy Left 09/27/2014    Procedure: Left Brachial, Radial,Ulnar Embolectomy with patch angioplasty left brachial, radial and ulnar artery;  Surgeon: [name], MD;  Location: [place];  Service: Vascular;  Laterality: Left;  . Embolectomy Left 09/27/2014    Procedure: Left Radial, Brachial, and Ulnar Thrombectomy; Left Brachial to Radial Bypass Graft using Greater Saphenous vein graft from Left Thigh; Left Saphenous Vein Harvest; Intraoperative Arteriogram; Intra-arterial administration of TPA;  Surgeon: [name], MD;  [facility];  Service: Vascular;  Laterality: Left;  . Tracheostomy N/A december 2015    There were no vitals filed for this visit.  Visit Diagnosis:  Lack of coordination  Hand joint pain, left  Hand muscle weakness      Subjective Assessment - 05/04/15 1422    Subjective  S: I can't feel how hard I'm  pinching it.    Currently in Pain? Yes   Pain Score 5    Pain Location Hand   Pain Orientation Left   Pain Descriptors / Indicators Aching;Constant   Pain Type Acute pain                      OT Treatments/Exercises (OP) - 05/04/15 1422    Exercises   Exercises Hand;Wrist   Fine Motor Coordination   Fine Motor Coordination Small Pegboard   Small Pegboard Pt completed colored pegboard not using the tweexers this date focusing on left hand 2 point pinch. Pt had max difficulty maintaining a 2 point pinch on finger pads. She frequently would pinch too hard and transition to a lateral pinch which would then cause the pegs to fly out of her hand. Pt did much better with vc's to control the amount of pinch strength used and to visually watch how she was holding    Grooved pegs Resistive clothespins completed with Bil hands. Left: yellow, red, and green placed with increased time and min difficulty. Right: All colors placed with increased time. Pt completed using a 3 point pinch with vc's periodically to maintain.                   OT Short Term Goals - 04/13/15 1540    OT SHORT TERM GOAL #1   Title Patient will be educated and independent with HEP.   Status On-going   OT SHORT TERM GOAL #2   Title Patient will increase AROM of left hand by 5 degrees to increase ability to use left hand during daily tasks.    Status On-going   OT SHORT TERM GOAL #3   Title Therapist will fabricate finger extension splint to defer the chance of progressing spaticity.    Status Achieved   OT SHORT TERM GOAL #4   Title Patient will increase coordination in Bil hands by decreasing completion time of  9 hole peg test by 10 seconds.    Status Achieved   OT SHORT TERM GOAL #5   Title Patient will increase grip strength by 5# and pinch strength by 3#  of left hand to increase ability to use left hand as an active assist during daily tasks.    Status On-going           OT Long Term  Goals - 04/13/15 1541    OT LONG TERM GOAL #1   Title Patient will return to highest level of independence with all daily activities.    Status On-going   OT LONG TERM GOAL #2   Title Patient will increase AROM of left hand by 10 degrees.   Status On-going   OT LONG TERM GOAL #3   Title Patient will increase PROM of left hand to [facility] to  decrease the progression of spaticity   Status On-going   OT LONG TERM GOAL #4   Title Patient will increase grip strength by 10# and pinch strength by 5# in LUE.   Status On-going   OT LONG TERM GOAL #5   Title Patient will increase Bil coordination by decreasing completion time of 9 hole peg test by 30 seconds.    Status Partially Met               Plan - 05/04/15 1505    Clinical Impression Statement A: pt has increased difficulty with small colored pegboard as she was unable to determine how hard she was actually pinching.   Plan P: Reassessment        Problem List Patient Active Problem List   Diagnosis Date Noted  . Primary hypercoagulable state 03/19/2015  . Chronic respiratory failure 03/19/2015  . Insomnia 02/02/2015  . MRSA pneumonia 10/11/2014  . Abdominal abscess   . Candidemia   . Screen for STD (sexually transmitted disease)   . 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 Perforated gastric ulcer 09/27/2014  . Prediabetes 07/23/2014  . Unspecified hereditary and idiopathic peripheral neuropathy 04/15/2014  . CVA (cerebral infarction) 04/02/2014  . Hypertriglyceridemia 12/11/2013  . Stiffness of joint, not elsewhere classified, ankle and foot 11/12/2013  . Lack of coordination 11/03/2013  . Pernicious anemia 09/12/2013  . Elevated transaminase level 09/12/2013  . Ataxia 09/09/2013  . Difficulty in walking(719.7) 09/09/2013  . Weakness of both legs 09/09/2013  . Generalized weakness 02/11/2013  . Tobacco abuse 01/14/2013  . Pedal edema 01/13/2013  . Chronic pain syndrome 12/30/2012  . Reactive airway disease  12/30/2012    Ailene Ravel, OTR/L,CBIS  [phone]  05/04/2015, 3:09 PM  [facility] [address] [place], [place], [address] Phone: [phone]   Fax:  [phone]

## 2015-05-04 NOTE — Therapy (Addendum)
[place], [place], [address] Phone: [phone]   Fax:  [phone]  Physical Therapy Treatment  Patient Details  Name: [name] MRN: [id number] Date of Birth: 02-18-1966 Referring Provider:  Kathyrn Drown, MD  Encounter Date: 05/04/2015      PT End of Session - 05/04/15 1431    Visit Number 20   Number of Visits 24   Date for PT Re-Evaluation 06/06/15   Authorization Type Medicare    Authorization Time Period 04/06/15-06/06/15; G-code done 13th visit    Authorization - Visit Number 20   Authorization - Number of Visits 24   PT Start Time 1300   PT Stop Time 1345   PT Time Calculation (min) 45 min   Equipment Utilized During Treatment Gait belt   Activity Tolerance Patient tolerated treatment well   Behavior During Therapy [facility] for tasks assessed/performed      Past Medical History  Diagnosis Date  . Anxiety   . Depression   . COPD (chronic obstructive pulmonary disease)   . MS (multiple sclerosis)   . Fibromyalgia   . Impaired fasting glucose   . 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 Eating disorder   . Chronic low back pain   . PONV (postoperative nausea and vomiting)   . Chronic bronchitis     "yearly" (02/11/2013)  . Borderline diabetes   . GERD (gastroesophageal reflux disease)   . Migraine     "I use to" (02/11/2013)  . Arthritis     "hands & feet" (02/11/2013)  . Peripheral neuropathy     [name] 02/11/2013  . B12 deficiency anemia     [name] 02/11/2013  . Chronic pain syndrome     [name] 02/11/2013  . UTI (lower urinary tract infection) 02/11/2013    [name] 02/11/2013  . Chronic edema     BLE/notes 02/11/2013  . Ataxia   . Peripheral neuropathy   . Muscle weakness of lower extremity     bilateral  . Hypertension     Past Surgical History  Procedure Laterality Date  . Abdominal hysterectomy  ~ 1987; ~ 2004    "woodward; ferguson" (02/11/2013)  . Cesarean  section  1607; 1984; 1986  . Anterior cervical decomp/discectomy fusion  2009; 2011  . Tonsillectomy  1990's?  . Cataract extraction w/phaco  06/20/2011    Procedure: CATARACT EXTRACTION PHACO AND INTRAOCULAR LENS PLACEMENT (IOC);  Surgeon: [name] T. Gershon Crane;  Location: [facility];  Service: Ophthalmology;  Laterality: Left;  CDE 1.81  . Cataract extraction w/phaco  07/04/2011    Procedure: CATARACT EXTRACTION PHACO AND INTRAOCULAR LENS PLACEMENT (IOC);  Surgeon: [name] T. Gershon Crane;  Location: [facility];  Service: Ophthalmology;  Laterality: Right;  CDE: 1.76  . Yag laser application Right 3/71/0626    Procedure: YAG LASER APPLICATION;  Surgeon: [name] T. Gershon Crane, MD;  Location: [facility];  Service: Ophthalmology;  Laterality: Right;  . Yag laser application Left 9/48/5462    Procedure: YAG LASER APPLICATION;  Surgeon: [name] T. Gershon Crane, MD;  Location: [facility];  Service: Ophthalmology;  Laterality: Left;  . Appendectomy    . Laparotomy N/A 09/27/2014    Procedure: Exploratory Laparotomy, Biopsy of Perforated Gastric Ulcer, Closure with omental patch;  Surgeon: [name], MD;  Location: [facility];  Service: General;  Laterality: N/A;  . Embolectomy Left 09/27/2014    Procedure: Left Brachial, Radial,Ulnar Embolectomy with patch angioplasty left brachial, radial and ulnar artery;  Surgeon:  [name], MD;  Location: [facility] OR;  Service: Vascular;  Laterality: Left;  . Embolectomy Left 09/27/2014    Procedure: Left Radial, Brachial, and Ulnar Thrombectomy; Left Brachial to Radial Bypass Graft using Greater Saphenous vein graft from Left Thigh; Left Saphenous Vein Harvest; Intraoperative Arteriogram; Intra-arterial administration of TPA;  Surgeon: [name], MD;  Location: [facility] OR;  Service: Vascular;  Laterality: Left;  . Tracheostomy N/A december 2015    There were no vitals filed for this visit.  Visit Diagnosis:  Lack of coordination  Hand joint pain, left  Hand muscle weakness  Stiffness of hand joint,  left  Abnormality of gait  Weakness of both legs  Impaired functional mobility and activity tolerance  Ataxia  Proximal muscle weakness  Poor posture  Ataxic dysarthria  Neuromuscular disease      Subjective Assessment - 05/04/15 1311    Subjective PT states she went to [facility] yesterday for another MRI of her brain.  States she has not had any other strokes, just increased neuropathy as opposed to last report.  Pt reports no pain or falls.    Currently in Pain? No/denies                         [facility] Adult PT Treatment/Exercise - 05/04/15 0001    Knee/Hip Exercises: Stretches   Passive Hamstring Stretch Both;30 seconds;3 reps   Passive Hamstring Stretch Limitations supine    Piriformis Stretch Both;60 seconds;3 reps   Piriformis Stretch Limitations supine    Gastroc Stretch Both;3 reps;30 seconds   Gastroc Stretch Limitations passive in supine     Gait with RW 220' X 2 with min assist (rest between each bout).             PT Short Term Goals - 04/28/15 1502    PT SHORT TERM GOAL #1   Title Patient will demonstrate a grade of at least 3+/5 in proximal musculature and core in order to promote functional stability and safe mobility skills    Status On-going   PT SHORT TERM GOAL #2   Title Patient will be able to participate in standing tasks with Min(A) from therapist and weights on limbs to reduce ataxia symptoms for at least 7 minutes at a time    Status On-going   PT SHORT TERM GOAL #3   Title Patient to experience no more than 4/10 at worst on a daily basis at rest and during mobility    Status On-going   PT SHORT TERM GOAL #4   Title Patient will demonstrate the ability to ambulate at least 41ft x6 in body weight support system with weighted extremities and minimal fatigue, good gait mechanics throughout    Status On-going   PT SHORT TERM GOAL #5   Title Patient will be able to perform functional transfers, including sit<->stand and  stand<->pivot from wheelchair to bed with close supervision and no loss of balance    Status On-going   PT SHORT TERM GOAL #6   Title Patient will demonstrate improved posture while seated in wheelchair and will be able to state and demonstrate the importance of performing appropriate pressure relieving techniques consistently    Status On-going           PT Long Term Goals - 04/28/15 1503    PT LONG TERM GOAL #1   Title Patient will be independent in safely performed advanced HEP with assistance from caregivers as needed, updated PRN  PT LONG TERM GOAL #2   Title Patient will demonstrate at least 4/5 to 4+/5 muscle strength in all proximal muscles, bilateral lower extremities, and core    PT LONG TERM GOAL #3   Title Patient will demonstrate bilateral ankle DF of at least 10 degrees in order to improve overall gait mechanics and functional mobility skills    PT LONG TERM GOAL #4   Title Patient will demonstrate the ability to ambulate at least 160ft with LRAD, no more than distant supervision, no loss of balance, and minimal fatigue throughout    PT LONG TERM GOAL #5   Title Patient will be independent in performing functional transfers, including all bed mobility skills and sit<->stand and stand pivot transfers with Mod(I) and LRAD    PT LONG TERM GOAL #6   Title Patient will demonstrate the ability to stand at least 15 minutes before fatiguing and with good posture and minimal loss of balance    PT LONG TERM GOAL #7   Title Patient will regularly perform weight bearing activities at home with assistance from caregivers as needed in order to protect skin and reduce detrimental effects of being sedentary                Plan - 05/04/15 1432    Clinical Impression Statement PT received supine PROM prior to therex today.  Pt with noted tightness in bilateral LE's, especially achilles.  Pt without LOB or impulsivities with ambulation following stretches.  Sit to stand completed  also with improved form and without use of UE's.   Clinical Impairments Affecting Rehab Potential chronic ataxia symptoms however patient has reportedly been able to return to walking after other bouts of illness    PT Next Visit Plan Continue with stretching of plantarflexors, strengthening of R dorsiflexion, gait training, and standing BLE strengthening to improve standing tolerance.  Continue gait training with treadmill for set cadence or addition of nustep to improve reciprocal movments and strength.    PT Home Exercise Plan No updates   Consulted and Agree with Plan of Care Patient        Problem List Patient Active Problem List   Diagnosis Date Noted  . Primary hypercoagulable state 03/19/2015  . Chronic respiratory failure 03/19/2015  . Insomnia 02/02/2015  . MRSA pneumonia 10/11/2014  . Abdominal abscess   . Candidemia   . Screen for STD (sexually transmitted disease)   . 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 Perforated gastric ulcer 09/27/2014  . Prediabetes 07/23/2014  . Unspecified hereditary and idiopathic peripheral neuropathy 04/15/2014  . CVA (cerebral infarction) 04/02/2014  . Hypertriglyceridemia 12/11/2013  . Stiffness of joint, not elsewhere classified, ankle and foot 11/12/2013  . Lack of coordination 11/03/2013  . Pernicious anemia 09/12/2013  . Elevated transaminase level 09/12/2013  . Ataxia 09/09/2013  . Difficulty in walking(719.7) 09/09/2013  . Weakness of both legs 09/09/2013  . Generalized weakness 02/11/2013  . Tobacco abuse 01/14/2013  . Pedal edema 01/13/2013  . Chronic pain syndrome 12/30/2012  . Reactive airway disease 12/30/2012    Teena Irani, PTA/CLT [phone]  05/04/2015, 2:36 PM  [facility] [address] [place], [place], [address] Phone: [phone]   Fax:  [phone]

## 2015-05-06 ENCOUNTER — Ambulatory Visit (HOSPITAL_COMMUNITY): Payer: BLUE CROSS/BLUE SHIELD

## 2015-05-06 ENCOUNTER — Encounter (HOSPITAL_COMMUNITY): Payer: Self-pay

## 2015-05-06 DIAGNOSIS — R279 Unspecified lack of coordination: Secondary | ICD-10-CM

## 2015-05-06 DIAGNOSIS — Z7409 Other reduced mobility: Secondary | ICD-10-CM

## 2015-05-06 DIAGNOSIS — R29898 Other symptoms and signs involving the musculoskeletal system: Secondary | ICD-10-CM

## 2015-05-06 DIAGNOSIS — M6289 Other specified disorders of muscle: Secondary | ICD-10-CM | POA: Diagnosis not present

## 2015-05-06 DIAGNOSIS — M6281 Muscle weakness (generalized): Secondary | ICD-10-CM

## 2015-05-06 DIAGNOSIS — M79642 Pain in left hand: Secondary | ICD-10-CM

## 2015-05-06 DIAGNOSIS — R293 Abnormal posture: Secondary | ICD-10-CM

## 2015-05-06 DIAGNOSIS — R270 Ataxia, unspecified: Secondary | ICD-10-CM

## 2015-05-06 DIAGNOSIS — R269 Unspecified abnormalities of gait and mobility: Secondary | ICD-10-CM

## 2015-05-06 NOTE — Therapy (Signed)
[place], [place], [address] Phone: [phone]   Fax:  [phone]  Physical Therapy Treatment  Patient Details  Name: [name] MRN: [id number] Date of Birth: 30-May-1966 Referring Provider:  Kathyrn Drown, MD  Encounter Date: 05/06/2015      PT End of Session - 05/06/15 1349    Visit Number 21   Number of Visits 24   Date for PT Re-Evaluation 06/06/15   Authorization Type Medicare    Authorization Time Period 04/06/15-06/06/15; G-code done 13th visit    Authorization - Visit Number 21   Authorization - Number of Visits 24   PT Start Time 9485   PT Stop Time 1430   PT Time Calculation (min) 43 min   Equipment Utilized During Treatment Gait belt   Activity Tolerance Patient tolerated treatment well;Patient limited by fatigue   Behavior During Therapy [facility] for tasks assessed/performed      Past Medical History  Diagnosis Date  . Anxiety   . Depression   . COPD (chronic obstructive pulmonary disease)   . MS (multiple sclerosis)   . Fibromyalgia   . Impaired fasting glucose   . 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 Eating disorder   . Chronic low back pain   . PONV (postoperative nausea and vomiting)   . Chronic bronchitis     "yearly" (02/11/2013)  . Borderline diabetes   . GERD (gastroesophageal reflux disease)   . Migraine     "I use to" (02/11/2013)  . Arthritis     "hands & feet" (02/11/2013)  . Peripheral neuropathy     [name] 02/11/2013  . B12 deficiency anemia     [name] 02/11/2013  . Chronic pain syndrome     [name] 02/11/2013  . UTI (lower urinary tract infection) 02/11/2013    [name] 02/11/2013  . Chronic edema     BLE/notes 02/11/2013  . Ataxia   . Peripheral neuropathy   . Muscle weakness of lower extremity     bilateral  . Hypertension     Past Surgical History  Procedure Laterality Date  . Abdominal hysterectomy  ~ 1987; ~ 2004    "woodward;  ferguson" (02/11/2013)  . Cesarean section  4627; 1984; 1986  . Anterior cervical decomp/discectomy fusion  2009; 2011  . Tonsillectomy  1990's?  . Cataract extraction w/phaco  06/20/2011    Procedure: CATARACT EXTRACTION PHACO AND INTRAOCULAR LENS PLACEMENT (IOC);  Surgeon: [name] T. Gershon Crane;  Location: [facility];  Service: Ophthalmology;  Laterality: Left;  CDE 1.81  . Cataract extraction w/phaco  07/04/2011    Procedure: CATARACT EXTRACTION PHACO AND INTRAOCULAR LENS PLACEMENT (IOC);  Surgeon: [name] T. Gershon Crane;  Location: [facility];  Service: Ophthalmology;  Laterality: Right;  CDE: 1.76  . Yag laser application Right 0/35/0093    Procedure: YAG LASER APPLICATION;  Surgeon: [name] T. Gershon Crane, MD;  Location: [facility];  Service: Ophthalmology;  Laterality: Right;  . Yag laser application Left 05/26/2992    Procedure: YAG LASER APPLICATION;  Surgeon: [name] T. Gershon Crane, MD;  Location: [facility];  Service: Ophthalmology;  Laterality: Left;  . Appendectomy    . Laparotomy N/A 09/27/2014    Procedure: Exploratory Laparotomy, Biopsy of Perforated Gastric Ulcer, Closure with omental patch;  Surgeon: [name], MD;  Location: [facility];  Service: General;  Laterality: N/A;  . Embolectomy Left 09/27/2014    Procedure: Left Brachial, Radial,Ulnar Embolectomy with patch angioplasty left brachial, radial and ulnar  artery;  Surgeon: [name], MD;  Location: [facility] OR;  Service: Vascular;  Laterality: Left;  . Embolectomy Left 09/27/2014    Procedure: Left Radial, Brachial, and Ulnar Thrombectomy; Left Brachial to Radial Bypass Graft using Greater Saphenous vein graft from Left Thigh; Left Saphenous Vein Harvest; Intraoperative Arteriogram; Intra-arterial administration of TPA;  Surgeon: [name], MD;  Location: [facility] OR;  Service: Vascular;  Laterality: Left;  . Tracheostomy N/A december 2015    There were no vitals filed for this visit.  Visit Diagnosis:  Abnormality of gait  Weakness of both legs  Impaired  functional mobility and activity tolerance  Ataxia  Proximal muscle weakness  Poor posture      Subjective Assessment - 05/06/15 1348    Subjective Pt stated she continues to have pain in Bil hands, Legs and feet, pain scale 5/10 today   Currently in Pain? Yes   Pain Score 5    Pain Location Generalized  Hands, legs and feet   Pain Orientation Right;Left   Pain Descriptors / Indicators Sore                         [facility] Adult PT Treatment/Exercise - 05/06/15 0001    Ambulation/Gait   Ambulation/Gait Yes   Ambulation/Gait Assistance 4: Min guard   Ambulation Distance (Feet) 300 Feet   Assistive device Rolling walker   Exercises   Exercises Knee/Hip   Knee/Hip Exercises: Stretches   Piriformis Stretch Both;60 seconds;3 reps   Piriformis Stretch Limitations supine    Gastroc Stretch Both;3 reps;30 seconds   Gastroc Stretch Limitations passive in supine   Knee/Hip Exercises: Aerobic   Tread Mill .4 with weight bearing supprt 1x set x 3' prior c/o LBP, gait training on TM held for rest of session.                  PT Short Term Goals - 05/06/15 1512    PT SHORT TERM GOAL #1   Title Patient will demonstrate a grade of at least 3+/5 in proximal musculature and core in order to promote functional stability and safe mobility skills    Status On-going   PT SHORT TERM GOAL #2   Title Patient will be able to participate in standing tasks with Min(A) from therapist and weights on limbs to reduce ataxia symptoms for at least 7 minutes at a time    Status On-going   PT SHORT TERM GOAL #3   Title Patient to experience no more than 4/10 at worst on a daily basis at rest and during mobility    Status On-going   PT SHORT TERM GOAL #4   Title Patient will demonstrate the ability to ambulate at least 34ft x6 in body weight support system with weighted extremities and minimal fatigue, good gait mechanics throughout    Status On-going   PT SHORT TERM GOAL #5    Title Patient will be able to perform functional transfers, including sit<->stand and stand<->pivot from wheelchair to bed with close supervision and no loss of balance    Status On-going   PT SHORT TERM GOAL #6   Title Patient will demonstrate improved posture while seated in wheelchair and will be able to state and demonstrate the importance of performing appropriate pressure relieving techniques consistently    Status On-going           PT Long Term Goals - 05/06/15 1513    PT LONG TERM GOAL #  1   Title Patient will be independent in safely performed advanced HEP with assistance from caregivers as needed, updated PRN    PT LONG TERM GOAL #2   Title Patient will demonstrate at least 4/5 to 4+/5 muscle strength in all proximal muscles, bilateral lower extremities, and core    PT LONG TERM GOAL #3   Title Patient will demonstrate bilateral ankle DF of at least 10 degrees in order to improve overall gait mechanics and functional mobility skills    PT LONG TERM GOAL #4   Title Patient will demonstrate the ability to ambulate at least 153ft with LRAD, no more than distant supervision, no loss of balance, and minimal fatigue throughout    PT LONG TERM GOAL #5   Title Patient will be independent in performing functional transfers, including all bed mobility skills and sit<->stand and stand pivot transfers with Mod(I) and LRAD    PT LONG TERM GOAL #6   Title Patient will demonstrate the ability to stand at least 15 minutes before fatiguing and with good posture and minimal loss of balance    PT LONG TERM GOAL #7   Title Patient will regularly perform weight bearing activities at home with assistance from caregivers as needed in order to protect skin and reduce detrimental effects of being sedentary                Plan - 05/06/15 1458    Clinical Impression Statement Continued with supine PROM Bil LE prior therex today to improve flexibilty especially achilles and piriformis.  Trial with  gait training on treadmill with weight bearing support system to improve cadence and more manual therapist facilitation for foot placement to improve gait mechanics.  Pt c/o LBP so resumed gait training on floor with RW.  No reports of increased pain through session, pt was limited by fatigue.   PT Next Visit Plan Continue with stretching of plantarflexors, strengthening of R dorsiflexion, gait training, and standing BLE strengthening to improve standing tolerance.  Continue gait training with treadmill for set cadence or addition of nustep to improve reciprocal movments and strength.         Problem List Patient Active Problem List   Diagnosis Date Noted  . Primary hypercoagulable state 03/19/2015  . Chronic respiratory failure 03/19/2015  . Insomnia 02/02/2015  . MRSA pneumonia 10/11/2014  . Abdominal abscess   . Candidemia   . Screen for STD (sexually transmitted disease)   . 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 Perforated gastric ulcer 09/27/2014  . Prediabetes 07/23/2014  . Unspecified hereditary and idiopathic peripheral neuropathy 04/15/2014  . CVA (cerebral infarction) 04/02/2014  . Hypertriglyceridemia 12/11/2013  . Stiffness of joint, not elsewhere classified, ankle and foot 11/12/2013  . Lack of coordination 11/03/2013  . Pernicious anemia 09/12/2013  . Elevated transaminase level 09/12/2013  . Ataxia 09/09/2013  . Difficulty in walking(719.7) 09/09/2013  . Weakness of both legs 09/09/2013  . Generalized weakness 02/11/2013  . Tobacco abuse 01/14/2013  . Pedal edema 01/13/2013  . Chronic pain syndrome 12/30/2012  . Reactive airway disease 12/30/2012   Ihor Austin, [place]; [place]  [name] 05/06/2015, 3:16 PM  [place], [place], [address] Phone: [phone]   Fax:  [phone]

## 2015-05-06 NOTE — Therapy (Signed)
[place], [place], [address] Phone: [phone]   Fax:  [phone]  Occupational Therapy Treatment & Reassessment  Patient Details  Name: [name] MRN: [id number] Date of Birth: 1965-10-13 Referring Provider:  Kathyrn Drown, MD  Encounter Date: 05/06/2015      OT End of Session - 05/06/15 1414    Visit Number 14   Number of Visits 16   Date for OT Re-Evaluation 05/10/15   Authorization Type Medicare A   Authorization Time Period before 20th visit   Authorization - Visit Number 51   Authorization - Number of Visits 20   OT Start Time 1300   OT Stop Time 1345   OT Time Calculation (min) 45 min   Activity Tolerance Patient tolerated treatment well   Behavior During Therapy [facility] for tasks assessed/performed      Past Medical History  Diagnosis Date  . Anxiety   . Depression   . COPD (chronic obstructive pulmonary disease)   . MS (multiple sclerosis)   . Fibromyalgia   . Impaired fasting glucose   . 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 Eating disorder   . Chronic low back pain   . PONV (postoperative nausea and vomiting)   . Chronic bronchitis     "yearly" (02/11/2013)  . Borderline diabetes   . GERD (gastroesophageal reflux disease)   . Migraine     "I use to" (02/11/2013)  . Arthritis     "hands & feet" (02/11/2013)  . Peripheral neuropathy     [name] 02/11/2013  . B12 deficiency anemia     [name] 02/11/2013  . Chronic pain syndrome     [name] 02/11/2013  . UTI (lower urinary tract infection) 02/11/2013    [name] 02/11/2013  . Chronic edema     BLE/notes 02/11/2013  . Ataxia   . Peripheral neuropathy   . Muscle weakness of lower extremity     bilateral  . Hypertension     Past Surgical History  Procedure Laterality Date  . Abdominal hysterectomy  ~ 1987; ~ 2004    "woodward; ferguson" (02/11/2013)  . Cesarean section  6440; 1984; 1986  . Anterior cervical  decomp/discectomy fusion  2009; 2011  . Tonsillectomy  1990's?  . Cataract extraction w/phaco  06/20/2011    Procedure: CATARACT EXTRACTION PHACO AND INTRAOCULAR LENS PLACEMENT (IOC);  Surgeon: [name] T. Gershon Crane;  [facility];  Service: Ophthalmology;  Laterality: Left;  CDE 1.81  . Cataract extraction w/phaco  07/04/2011    Procedure: CATARACT EXTRACTION PHACO AND INTRAOCULAR LENS PLACEMENT (IOC);  Surgeon: [name] T. Gershon Crane;  Location: [facility];  Service: Ophthalmology;  Laterality: Right;  CDE: 1.76  . Yag laser application Right 3/47/4259    Procedure: YAG LASER APPLICATION;  Surgeon: [name] T. Gershon Crane, MD;  [facility];  Service: Ophthalmology;  Laterality: Right;  . Yag laser application Left 5/63/8756    Procedure: YAG LASER APPLICATION;  Surgeon: [name] T. Gershon Crane, MD;  [facility];  Service: Ophthalmology;  Laterality: Left;  . Appendectomy    . Laparotomy N/A 09/27/2014    Procedure: Exploratory Laparotomy, Biopsy of Perforated Gastric Ulcer, Closure with omental patch;  Surgeon: [name], MD;  Location: [place];  Service: General;  Laterality: N/A;  . Embolectomy Left 09/27/2014    Procedure: Left Brachial, Radial,Ulnar Embolectomy with patch angioplasty left brachial, radial and ulnar artery;  Surgeon: [name], MD;  Location: [place];  Service: Vascular;  Laterality: Left;  . Embolectomy Left 09/27/2014    Procedure: Left Radial, Brachial, and Ulnar Thrombectomy; Left Brachial to Radial Bypass Graft using Greater Saphenous vein graft from Left Thigh; Left Saphenous Vein Harvest; Intraoperative Arteriogram; Intra-arterial administration of TPA;  Surgeon: [name], MD;  [facility];  Service: Vascular;  Laterality: Left;  . Tracheostomy N/A december 2015    There were no vitals filed for this visit.  Visit Diagnosis:  Lack of coordination - Plan: Ot plan of care cert/re-cert  Hand joint pain, left - Plan: Ot plan of care cert/re-cert  Hand muscle weakness -  Plan: Ot plan of care cert/re-cert      Subjective Assessment - 05/06/15 1325    Subjective  S: I feel like my grip has increased but my pinch is still not good.    Currently in Pain? Yes   Pain Score 5    Pain Location Hand   Pain Orientation Left;Right   Pain Descriptors / Indicators Aching;Constant            [facility] OT Assessment - 05/06/15 1313    Assessment   Diagnosis left hand spaticity   Precautions   Precautions Fall   Coordination   Left 9 Hole Peg Test 2'30"  last progress note: 2'19"   AROM   Right/Left Wrist Right   Left Wrist Extension 60 Degrees  last progress note: 55   Left Wrist Flexion 30 Degrees  last progress note: 32   Left Wrist Radial Deviation 48 Degrees  last progress note: 8   Left Wrist Ulnar Deviation 34 Degrees  last progress note: 12   Right/Left Finger Left   Left Composite Finger Extension 75%  last progress note: 75%   Strength   Left Hand Grip (lbs) 20  last progress note: 17   Left Hand Lateral Pinch 6 lbs  same at last progress note   Left Hand 3 Point Pinch 5.5 lbs  last progress note: 5                  OT Treatments/Exercises (OP) - 05/06/15 1330    Fine Motor Coordination   Other Fine Motor Exercises Pt completed ping pong color patten to increase fine motor and gross motor coordination. Bilateral hands completed separately. Max difficulty with L hand. Min-mod difficulty with R hand.                   OT Short Term Goals - 05/06/15 1311    OT SHORT TERM GOAL #1   Title Patient will be educated and independent with HEP.   Status On-going   OT SHORT TERM GOAL #2   Title Patient will increase AROM of left hand by 5 degrees to increase ability to use left hand during daily tasks.    Status Achieved   OT SHORT TERM GOAL #3   Title Therapist will fabricate finger extension splint to defer the chance of progressing spaticity.    OT SHORT TERM GOAL #4   Title Patient will increase coordination in Bil  hands by decreasing completion time of  9 hole peg test by 10 seconds.    Status --   OT SHORT TERM GOAL #5   Title Patient will increase grip strength by 5# and pinch strength by 3#  of left hand to increase ability to use left hand as an active assist during daily tasks.    Status Achieved  OT Long Term Goals - 05/20/2015 1322    OT LONG TERM GOAL #1   Title Patient will return to highest level of independence with all daily activities.    Status On-going   OT LONG TERM GOAL #2   Title Patient will increase AROM of left hand by 10 degrees.   Status On-going   OT LONG TERM GOAL #3   Title Patient will increase PROM of left hand to [facility] to decrease the progression of spaticity   Status Partially Met   OT LONG TERM GOAL #4   Title Patient will increase grip strength by 10# and pinch strength by 5# in LUE.   Status On-going   OT LONG TERM GOAL #5   Title Patient will increase Bil coordination by decreasing completion time of 9 hole peg test by 30 seconds.    Status Partially Met               Plan - 05/20/15 1417    Clinical Impression Statement A: Reassessment completed this date. Patient has met all but 1 STG. Pt has shown improvement with all measurements except pinch strengthening and wrist flexion measurements. Coordination was slightly worse today.   Plan P: Cont to work on increase coordination and pinch strength as these are the two areas of weakness. Also add strengthening exercises to wrist flexion.          G-Codes - 05-20-2015 1609    Functional Assessment Tool Used Left grip strength (20#) vs. right grip strength (45#) - (56% impaired)   Functional Limitation Carrying, moving and handling objects   Carrying, Moving and Handling Objects Current Status (W4097) At least 40 percent but less than 60 percent impaired, limited or restricted   Carrying, Moving and Handling Objects Goal Status (D5329) At least 20 percent but less than 40 percent impaired,  limited or restricted      Problem List Patient Active Problem List   Diagnosis Date Noted  . Primary hypercoagulable state 03/19/2015  . Chronic respiratory failure 03/19/2015  . Insomnia 02/02/2015  . MRSA pneumonia 10/11/2014  . Abdominal abscess   . Candidemia   . Screen for STD (sexually transmitted disease)   . 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 Perforated gastric ulcer 09/27/2014  . Prediabetes 07/23/2014  . Unspecified hereditary and idiopathic peripheral neuropathy 04/15/2014  . CVA (cerebral infarction) 04/02/2014  . Hypertriglyceridemia 12/11/2013  . Stiffness of joint, not elsewhere classified, ankle and foot 11/12/2013  . Lack of coordination 11/03/2013  . Pernicious anemia 09/12/2013  . Elevated transaminase level 09/12/2013  . Ataxia 09/09/2013  . Difficulty in walking(719.7) 09/09/2013  . Weakness of both legs 09/09/2013  . Generalized weakness 02/11/2013  . Tobacco abuse 01/14/2013  . Pedal edema 01/13/2013  . Chronic pain syndrome 12/30/2012  . Reactive airway disease 12/30/2012   Occupational Therapy Progress Note  Dates of Reporting Period: 04/13/15 to 2015-05-20  Objective Reports of Subjective Statement: Pt reports that she can tell her grip strength has improved. Pinch strength is still challenging.   Objective Measurements: See above for measurements taken this date.  Goal Update: Patient has met 4/5 STGs and is progressing towards LTGs.  Plan: Continue therapy focusing on pinch strength, coordination, and wrist flexion ROM and strengthening. Pt is to bring splint in next session for adjustments to increase the degrees of extension.  Reason Skilled Services are Required: Skilled OT services are needed to increase LUE grip and pinch strength, coordination and overall functional performance during ADL  tasks.   Ailene Ravel, OTR/L,CBIS  [phone]  05/06/2015, 4:13 PM  [place], [place], [address] Phone: [phone]   Fax:  [phone]

## 2015-05-07 ENCOUNTER — Ambulatory Visit: Payer: Self-pay | Admitting: Pulmonary Disease

## 2015-05-07 ENCOUNTER — Ambulatory Visit: Payer: BLUE CROSS/BLUE SHIELD | Attending: Family Medicine | Admitting: Sleep Medicine

## 2015-05-07 DIAGNOSIS — R0683 Snoring: Secondary | ICD-10-CM

## 2015-05-07 DIAGNOSIS — R5383 Other fatigue: Secondary | ICD-10-CM

## 2015-05-07 DIAGNOSIS — G4730 Sleep apnea, unspecified: Secondary | ICD-10-CM

## 2015-05-10 ENCOUNTER — Other Ambulatory Visit (HOSPITAL_COMMUNITY): Payer: Self-pay | Admitting: Respiratory Therapy

## 2015-05-11 ENCOUNTER — Ambulatory Visit (HOSPITAL_COMMUNITY): Payer: BLUE CROSS/BLUE SHIELD | Attending: Family Medicine | Admitting: Occupational Therapy

## 2015-05-11 ENCOUNTER — Encounter (HOSPITAL_COMMUNITY): Payer: Self-pay | Admitting: Occupational Therapy

## 2015-05-11 DIAGNOSIS — G709 Myoneural disorder, unspecified: Secondary | ICD-10-CM | POA: Diagnosis not present

## 2015-05-11 DIAGNOSIS — R29898 Other symptoms and signs involving the musculoskeletal system: Secondary | ICD-10-CM | POA: Diagnosis not present

## 2015-05-11 DIAGNOSIS — M25642 Stiffness of left hand, not elsewhere classified: Secondary | ICD-10-CM | POA: Diagnosis not present

## 2015-05-11 DIAGNOSIS — R490 Dysphonia: Secondary | ICD-10-CM | POA: Insufficient documentation

## 2015-05-11 DIAGNOSIS — R279 Unspecified lack of coordination: Secondary | ICD-10-CM

## 2015-05-11 DIAGNOSIS — R293 Abnormal posture: Secondary | ICD-10-CM | POA: Diagnosis not present

## 2015-05-11 DIAGNOSIS — M6289 Other specified disorders of muscle: Secondary | ICD-10-CM | POA: Diagnosis not present

## 2015-05-11 DIAGNOSIS — R269 Unspecified abnormalities of gait and mobility: Secondary | ICD-10-CM | POA: Insufficient documentation

## 2015-05-11 DIAGNOSIS — R270 Ataxia, unspecified: Secondary | ICD-10-CM | POA: Diagnosis not present

## 2015-05-11 DIAGNOSIS — M79642 Pain in left hand: Secondary | ICD-10-CM | POA: Diagnosis not present

## 2015-05-11 DIAGNOSIS — Z7409 Other reduced mobility: Secondary | ICD-10-CM | POA: Insufficient documentation

## 2015-05-11 DIAGNOSIS — M6281 Muscle weakness (generalized): Secondary | ICD-10-CM

## 2015-05-11 DIAGNOSIS — R471 Dysarthria and anarthria: Secondary | ICD-10-CM | POA: Insufficient documentation

## 2015-05-11 NOTE — Therapy (Signed)
[place], [place], [address] Phone: [phone]   Fax:  [phone]  Occupational Therapy Treatment  Patient Details  Name: [name] MRN: [id number] Date of Birth: 1966/06/27 Referring Provider:  Kathyrn Drown, MD  Encounter Date: 05/11/2015      OT End of Session - 05/11/15 1517    Visit Number 15   Number of Visits 16   Date for OT Re-Evaluation 07/10/15  Mini-reassessment 06/09/2015   Authorization Type Medicare A   Authorization Time Period before 20th visit   Authorization - Visit Number 15   Authorization - Number of Visits 20   OT Start Time 1432   OT Stop Time 1515   OT Time Calculation (min) 43 min   Activity Tolerance Patient tolerated treatment well   Behavior During Therapy [facility] for tasks assessed/performed      Past Medical History  Diagnosis Date  . Anxiety   . Depression   . COPD (chronic obstructive pulmonary disease)   . MS (multiple sclerosis)   . Fibromyalgia   . Impaired fasting glucose   . 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 Eating disorder   . Chronic low back pain   . PONV (postoperative nausea and vomiting)   . Chronic bronchitis     "yearly" (02/11/2013)  . Borderline diabetes   . GERD (gastroesophageal reflux disease)   . Migraine     "I use to" (02/11/2013)  . Arthritis     "hands & feet" (02/11/2013)  . Peripheral neuropathy     [name] 02/11/2013  . B12 deficiency anemia     [name] 02/11/2013  . Chronic pain syndrome     [name] 02/11/2013  . UTI (lower urinary tract infection) 02/11/2013    [name] 02/11/2013  . Chronic edema     BLE/notes 02/11/2013  . Ataxia   . Peripheral neuropathy   . Muscle weakness of lower extremity     bilateral  . Hypertension     Past Surgical History  Procedure Laterality Date  . Abdominal hysterectomy  ~ 1987; ~ 2004    "woodward; ferguson" (02/11/2013)  . Cesarean section  1660; 1984; 1986  . Anterior  cervical decomp/discectomy fusion  2009; 2011  . Tonsillectomy  1990's?  . Cataract extraction w/phaco  06/20/2011    Procedure: CATARACT EXTRACTION PHACO AND INTRAOCULAR LENS PLACEMENT (IOC);  Surgeon: [name] T. Gershon Crane;  [facility];  Service: Ophthalmology;  Laterality: Left;  CDE 1.81  . Cataract extraction w/phaco  07/04/2011    Procedure: CATARACT EXTRACTION PHACO AND INTRAOCULAR LENS PLACEMENT (IOC);  Surgeon: [name] T. Gershon Crane;  [facility]: [facility];  Service: Ophthalmology;  Laterality: Right;  CDE: 1.76  . Yag laser application Right 6/00/4599    Procedure: YAG LASER APPLICATION;  Surgeon: [name] T. Gershon Crane, MD;  Location: [facility];  Service: Ophthalmology;  Laterality: Right;  . Yag laser application Left 7/74/1423    Procedure: YAG LASER APPLICATION;  Surgeon: [name] T. Gershon Crane, MD;  Location[facility];  Service: Ophthalmology;  Laterality: Left;  . Appendectomy    . Laparotomy N/A 09/27/2014    Procedure: Exploratory Laparotomy, Biopsy of Perforated Gastric Ulcer, Closure with omental patch;  Surgeon: [name], MD;  Location: [place];  Service: General;  Laterality: N/A;  . Embolectomy Left 09/27/2014    Procedure: Left Brachial, Radial,Ulnar Embolectomy with patch angioplasty left brachial, radial and ulnar artery;  Surgeon: [name], MD;  Location: [place];  Service: Vascular;  Laterality: Left;  . Embolectomy Left 09/27/2014    Procedure: Left Radial, Brachial, and Ulnar Thrombectomy; Left Brachial to Radial Bypass Graft using Greater Saphenous vein graft from Left Thigh; Left Saphenous Vein Harvest; Intraoperative Arteriogram; Intra-arterial administration of TPA;  Surgeon: [name], MD;  Location: [facility];  Service: Vascular;  Laterality: Left;  . Tracheostomy N/A december 2015    There were no vitals filed for this visit.  Visit Diagnosis:  Lack of coordination  Hand joint pain, left  Hand muscle weakness      Subjective Assessment - 05/11/15 1434    Subjective  S:  I'm going to get botox in my hand on Thursday.    Currently in Pain? Yes   Pain Score 5    Pain Location Generalized  hands, legs, feet   Pain Orientation Right;Left   Pain Descriptors / Indicators Sore   Pain Type Acute pain            [facility] OT Assessment - 05/11/15 1434    Assessment   Diagnosis left hand spaticity   Precautions   Precautions Fall                  OT Treatments/Exercises (OP) - 05/11/15 1435    Exercises   Exercises Hand;Wrist   Wrist Exercises   Forearm Supination Strengthening;10 reps   Bar Weights/Barbell (Forearm Supination) 1 lb   Forearm Pronation Strengthening;10 reps   Bar Weights/Barbell (Forearm Pronation) 1 lb   Wrist Flexion Strengthening;10 reps   Bar Weights/Barbell (Wrist Flexion) 1 lb   Wrist Extension Strengthening;10 reps   Bar Weights/Barbell (Wrist Extension) 1 lb   Wrist Radial Deviation Strengthening;10 reps   Bar Weights/Barbell (Radial Deviation) 1 lb   Wrist Ulnar Deviation Strengthening;10 reps   Bar Weights/Barbell (Ulnar Deviation) 1 lb   Fine Motor Coordination   Fine Motor Coordination Flipping cards;Nuts and Bolts   Flipping cards Pt held cards with right hand, while removing top card with thumb and index finger of left hand and flipping the card over on the table. Pt had mod difficulty, using increased time for task.    Nuts and Bolts Pt removed and replaced 2 nuts, one with right hand and one with left hand. Pt had mod difficulty with right hand, max difficulty with left hand. Pt was unable to line up the nut and bolt using left hand. Pt required increased time for task.    Other Fine Motor Exercises Pt completed pinch activity using red resistive clothespin to grasp and place high resistance sponges into bucket. Pt used lateral with with left hand and 3 point pinch with right hand to pick up sponges. Pt was able to complete 16/30 with left hand, and 30/30 with right hand. Pt required extended time to complete  task.                    OT Short Term Goals - 05/06/15 1311    OT SHORT TERM GOAL #1   Title Patient will be educated and independent with HEP.   Status On-going   OT SHORT TERM GOAL #2   Title Patient will increase AROM of left hand by 5 degrees to increase ability to use left hand during daily tasks.    Status Achieved   OT SHORT TERM GOAL #3   Title Therapist will fabricate finger extension splint to defer the chance of progressing spaticity.    OT SHORT TERM GOAL #4  Title Patient will increase coordination in Bil hands by decreasing completion time of  9 hole peg test by 10 seconds.    Status --   OT SHORT TERM GOAL #5   Title Patient will increase grip strength by 5# and pinch strength by 3#  of left hand to increase ability to use left hand as an active assist during daily tasks.    Status Achieved           OT Long Term Goals - 05/06/15 1322    OT LONG TERM GOAL #1   Title Patient will return to highest level of independence with all daily activities.    Status On-going   OT LONG TERM GOAL #2   Title Patient will increase AROM of left hand by 10 degrees.   Status On-going   OT LONG TERM GOAL #3   Title Patient will increase PROM of left hand to [facility] to decrease the progression of spaticity   Status Partially Met   OT LONG TERM GOAL #4   Title Patient will increase grip strength by 10# and pinch strength by 5# in LUE.   Status On-going   OT LONG TERM GOAL #5   Title Patient will increase Bil coordination by decreasing completion time of 9 hole peg test by 30 seconds.    Status Partially Met               Plan - 05/11/15 1517    Clinical Impression Statement A: Resumed wrist strenthening exercises this date. Continued pinch strengthening, added [facility] tasks of card flipping and nuts/bolts task. Pt had mod-max diffculty with fine motor tasks. Verbal cuing required for form and technique during pinch exercises. Pt reports fatigue at end of session.     Plan P: Continue with pinch exercises using sponges, attempt to grasp & place all sponges using left hand. Continue with [facility] activities.         Problem List Patient Active Problem List   Diagnosis Date Noted  . Primary hypercoagulable state 03/19/2015  . Chronic respiratory failure 03/19/2015  . Insomnia 02/02/2015  . MRSA pneumonia 10/11/2014  . Abdominal abscess   . Candidemia   . Screen for STD (sexually transmitted disease)   . 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 Perforated gastric ulcer 09/27/2014  . Prediabetes 07/23/2014  . Unspecified hereditary and idiopathic peripheral neuropathy 04/15/2014  . CVA (cerebral infarction) 04/02/2014  . Hypertriglyceridemia 12/11/2013  . Stiffness of joint, not elsewhere classified, ankle and foot 11/12/2013  . Lack of coordination 11/03/2013  . Pernicious anemia 09/12/2013  . Elevated transaminase level 09/12/2013  . Ataxia 09/09/2013  . Difficulty in walking(719.7) 09/09/2013  . Weakness of both legs 09/09/2013  . Generalized weakness 02/11/2013  . Tobacco abuse 01/14/2013  . Pedal edema 01/13/2013  . Chronic pain syndrome 12/30/2012  . Reactive airway disease 12/30/2012    Guadelupe Sabin, OTR/L  [phone]  05/11/2015, 3:21 PM  [place] [address] [place], [place], [address] Phone: [phone]   Fax:  [phone]

## 2015-05-13 ENCOUNTER — Encounter (HOSPITAL_COMMUNITY): Payer: Self-pay

## 2015-05-13 DIAGNOSIS — M62838 Other muscle spasm: Secondary | ICD-10-CM | POA: Diagnosis not present

## 2015-05-13 DIAGNOSIS — M24542 Contracture, left hand: Secondary | ICD-10-CM | POA: Diagnosis not present

## 2015-05-13 NOTE — Sleep Study (Signed)
  [place]iota A. Merlene Laughter, MD     [email or web address]             NOCTURNAL POLYSOMNOGRAPHY   LOCATION: [facility]  Patient Name: [name], [name] Date: 05/07/2015 Gender: Female D.O.B: [birth year] Age (years): 48 Referring Provider: [name] Height (inches): 41 Interpreting Physician: Phillips Odor MD, ABSM Weight (lbs): 150 RPSGT: Rosebud Poles BMI: 32 MRN: [id number] Neck Size: 14.00 CLINICAL INFORMATION Sleep Study Type: NPSG  Indication for sleep study: Fatigue, Snoring, Witnessed Apneas  Epworth Sleepiness Score: 4  MEDICATIONS Medications taken by the patient : N/A  Medications administered by patient during sleep study : No sleep medicine administered. SLEEP STUDY TECHNIQUE As per the AASM Manual for the Scoring of Sleep and Associated Events v2.3 (April 2016) with a hypopnea requiring 4% desaturations.  The channels recorded and monitored were frontal, central and occipital EEG, electrooculogram (EOG), submentalis EMG (chin), nasal and oral airflow, thoracic and abdominal wall motion, anterior tibialis EMG, snore microphone, electrocardiogram, and pulse oximetry.  RESPIRATORY PARAMETERS There were a total of 2 respiratory disturbances out of which 2 were apneas ( 0 obstructive, 2 mixed, 0 central) and 0 hypopneas. The apnea/hypopnea index (AHI) was 0.4 events/hour. The central sleep apnea index was 0.0 events/hour. The REM AHI was 5.5 events/hour and NREM AHI was 0.0 events/hour. The supine AHI was N/A events/hour and the non supine AHI was N/A supine during 0.00% of sleep. Respiratory disturbances were associated with oxygen desaturation down to a nadir of 90.00% during sleep. The mean oxygen saturation during the study was 95.36%.  SLEEP ARCHITECTURE The study was initiated at 11:16:01 PM and terminated at 5:00:58 AM. The total recorded time was 344.9 minutes. EEG confirmed total sleep time was 321.6 minutes yielding a sleep efficiency of  93.2%. Sleep onset after lights out was 4.8 minutes with a REM latency of 204.5 minutes. The patient spent 0.62% of the night in stage N1 sleep, 37.62% in stage N2 sleep, 54.92% in stage N3 and 6.84% in REM. Wake after sleep onset (WASO) was 18.5 minutes. The Arousal Index was 1.7/hour. Split Night criteria was not met.  LEG MOVEMENT DATA The total Periodic Limb Movements of Sleep (PLMS) were 0. The PLMS index was 0.00 .  CARDIAC DATA The 2 lead EKG demonstrated sinus rhythm. The mean heart rate was 59.28 beats per minute. Other EKG findings include: None.  IMPRESSIONS No significant obstructive sleep apnea occurred during this study (AHI = 0.4/hour). No significant central sleep apnea occurred during this study (CAI = 0.0). Mild oxygen desaturation was noted during this study (Min O2 = 90.00). The patient snored with Moderate snoring volume during the diagnostic portion of the study. No cardiac abnormalities were noted during this study. Clinically significant periodic limb movements did not occur during sleep.     Delano Metz, MD Diplomate, American Board of Sleep Medicine.

## 2015-05-14 ENCOUNTER — Other Ambulatory Visit (HOSPITAL_COMMUNITY): Payer: Self-pay | Admitting: Respiratory Therapy

## 2015-05-18 ENCOUNTER — Encounter (HOSPITAL_COMMUNITY): Payer: Self-pay | Admitting: Occupational Therapy

## 2015-05-18 ENCOUNTER — Ambulatory Visit (HOSPITAL_COMMUNITY): Payer: BLUE CROSS/BLUE SHIELD | Admitting: Occupational Therapy

## 2015-05-18 ENCOUNTER — Ambulatory Visit (HOSPITAL_COMMUNITY): Payer: BLUE CROSS/BLUE SHIELD

## 2015-05-18 DIAGNOSIS — R293 Abnormal posture: Secondary | ICD-10-CM

## 2015-05-18 DIAGNOSIS — M79642 Pain in left hand: Secondary | ICD-10-CM

## 2015-05-18 DIAGNOSIS — R269 Unspecified abnormalities of gait and mobility: Secondary | ICD-10-CM

## 2015-05-18 DIAGNOSIS — R29898 Other symptoms and signs involving the musculoskeletal system: Secondary | ICD-10-CM

## 2015-05-18 DIAGNOSIS — Z7409 Other reduced mobility: Secondary | ICD-10-CM

## 2015-05-18 DIAGNOSIS — R270 Ataxia, unspecified: Secondary | ICD-10-CM

## 2015-05-18 DIAGNOSIS — R279 Unspecified lack of coordination: Secondary | ICD-10-CM | POA: Diagnosis not present

## 2015-05-18 DIAGNOSIS — M6281 Muscle weakness (generalized): Secondary | ICD-10-CM

## 2015-05-18 DIAGNOSIS — M6289 Other specified disorders of muscle: Secondary | ICD-10-CM | POA: Diagnosis not present

## 2015-05-18 DIAGNOSIS — M25642 Stiffness of left hand, not elsewhere classified: Secondary | ICD-10-CM | POA: Diagnosis not present

## 2015-05-18 NOTE — Therapy (Signed)
[place], [place], [address] Phone: [phone]   Fax:  [phone]  Physical Therapy Treatment  Patient Details  Name: [name] MRN: [id number] Date of Birth: 11-30-1965 Referring Provider:  Kathyrn Drown, MD  Encounter Date: 05/18/2015      PT End of Session - 05/18/15 1615    Visit Number 22   Number of Visits 24   Date for PT Re-Evaluation 06/06/15   Authorization Type Medicare    Authorization Time Period 04/06/15-06/06/15; G-code done 13th visit    Authorization - Visit Number 12   Authorization - Number of Visits 23   PT Start Time 6160   PT Stop Time 1431   PT Time Calculation (min) 38 min   Equipment Utilized During Treatment Gait belt   Activity Tolerance Patient tolerated treatment well;Patient limited by fatigue   Behavior During Therapy [facility] for tasks assessed/performed      Past Medical History  Diagnosis Date  . Anxiety   . Depression   . COPD (chronic obstructive pulmonary disease)   . MS (multiple sclerosis)   . Fibromyalgia   . Impaired fasting glucose   . 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 Eating disorder   . Chronic low back pain   . PONV (postoperative nausea and vomiting)   . Chronic bronchitis     "yearly" (02/11/2013)  . Borderline diabetes   . GERD (gastroesophageal reflux disease)   . Migraine     "I use to" (02/11/2013)  . Arthritis     "hands & feet" (02/11/2013)  . Peripheral neuropathy     [name] 02/11/2013  . B12 deficiency anemia     [name] 02/11/2013  . Chronic pain syndrome     [name] 02/11/2013  . UTI (lower urinary tract infection) 02/11/2013    [name] 02/11/2013  . Chronic edema     BLE/notes 02/11/2013  . Ataxia   . Peripheral neuropathy   . Muscle weakness of lower extremity     bilateral  . Hypertension     Past Surgical History  Procedure Laterality Date  . Abdominal hysterectomy  ~ 1987; ~ 2004    "woodward;  ferguson" (02/11/2013)  . Cesarean section  7371; 1984; 1986  . Anterior cervical decomp/discectomy fusion  2009; 2011  . Tonsillectomy  1990's?  . Cataract extraction w/phaco  06/20/2011    Procedure: CATARACT EXTRACTION PHACO AND INTRAOCULAR LENS PLACEMENT (IOC);  Surgeon: [name] T. Gershon Crane;  Location: [facility];  Service: Ophthalmology;  Laterality: Left;  CDE 1.81  . Cataract extraction w/phaco  07/04/2011    Procedure: CATARACT EXTRACTION PHACO AND INTRAOCULAR LENS PLACEMENT (IOC);  Surgeon: [name] T. Gershon Crane;  Location: [facility];  Service: Ophthalmology;  Laterality: Right;  CDE: 1.76  . Yag laser application Right 0/62/6948    Procedure: YAG LASER APPLICATION;  Surgeon: [name] T. Gershon Crane, MD;  Location: [facility];  Service: Ophthalmology;  Laterality: Right;  . Yag laser application Left 5/46/2703    Procedure: YAG LASER APPLICATION;  Surgeon: [name] T. Gershon Crane, MD;  Location: [facility];  Service: Ophthalmology;  Laterality: Left;  . Appendectomy    . Laparotomy N/A 09/27/2014    Procedure: Exploratory Laparotomy, Biopsy of Perforated Gastric Ulcer, Closure with omental patch;  Surgeon: [name], MD;  Location: [place];  Service: General;  Laterality: N/A;  . Embolectomy Left 09/27/2014    Procedure: Left Brachial, Radial,Ulnar Embolectomy with patch angioplasty left brachial, radial and ulnar  artery;  Surgeon: [name], MD;  Location: [facility] OR;  Service: Vascular;  Laterality: Left;  . Embolectomy Left 09/27/2014    Procedure: Left Radial, Brachial, and Ulnar Thrombectomy; Left Brachial to Radial Bypass Graft using Greater Saphenous vein graft from Left Thigh; Left Saphenous Vein Harvest; Intraoperative Arteriogram; Intra-arterial administration of TPA;  Surgeon: [name], MD;  Location: [facility] OR;  Service: Vascular;  Laterality: Left;  . Tracheostomy N/A december 2015    There were no vitals filed for this visit.  Visit Diagnosis:  Abnormality of gait  Weakness of both legs  Impaired  functional mobility and activity tolerance  Lack of coordination  Ataxia  Proximal muscle weakness  Poor posture      Subjective Assessment - 05/18/15 1434    Subjective Pt stated she went to [place] the other day and reports neuropathy is getting worse.  Current pain scale 5/10 Bil feet   Currently in Pain? Yes   Pain Score 5    Pain Location Foot   Pain Orientation Right;Left             [facility] Adult PT Treatment/Exercise - 05/18/15 0001    Ambulation/Gait   Ambulation/Gait Yes   Ambulation/Gait Assistance 4: Min guard   Ambulation Distance (Feet) 400 Feet   Assistive device Rolling walker   Exercises   Exercises Knee/Hip   Knee/Hip Exercises: Stretches   Piriformis Stretch Both;60 seconds;3 reps   Piriformis Stretch Limitations supine    Gastroc Stretch Both;3 reps;30 seconds   Gastroc Stretch Limitations passive in supine   Knee/Hip Exercises: Aerobic   Tread Mill held due to increased back pain   Knee/Hip Exercises: Standing   Hip Abduction Both;1 set;10 reps   Abduction Limitations parallel bars   Other Standing Knee Exercises standing weight shifts in parallel bars, side stepping 2RT in // bars    Other Standing Knee Exercises side to stand with staggered stance 1x 10 each LE           PT Short Term Goals - 05/18/15 1622    PT SHORT TERM GOAL #1   Title Patient will demonstrate a grade of at least 3+/5 in proximal musculature and core in order to promote functional stability and safe mobility skills    Status On-going   PT SHORT TERM GOAL #2   Title Patient will be able to participate in standing tasks with Min(A) from therapist and weights on limbs to reduce ataxia symptoms for at least 7 minutes at a time    Status On-going   PT SHORT TERM GOAL #3   Title Patient to experience no more than 4/10 at worst on a daily basis at rest and during mobility    Status On-going   PT SHORT TERM GOAL #4   Title Patient will demonstrate the ability to ambulate at  least 22ft x6 in body weight support system with weighted extremities and minimal fatigue, good gait mechanics throughout    Status On-going   PT SHORT TERM GOAL #5   Title Patient will be able to perform functional transfers, including sit<->stand and stand<->pivot from wheelchair to bed with close supervision and no loss of balance    Status On-going   PT SHORT TERM GOAL #6   Title Patient will demonstrate improved posture while seated in wheelchair and will be able to state and demonstrate the importance of performing appropriate pressure relieving techniques consistently    Status On-going  PT Long Term Goals - 05/18/15 1622    PT LONG TERM GOAL #1   Title Patient will be independent in safely performed advanced HEP with assistance from caregivers as needed, updated PRN    PT LONG TERM GOAL #2   Title Patient will demonstrate at least 4/5 to 4+/5 muscle strength in all proximal muscles, bilateral lower extremities, and core    PT LONG TERM GOAL #3   Title Patient will demonstrate bilateral ankle DF of at least 10 degrees in order to improve overall gait mechanics and functional mobility skills    PT LONG TERM GOAL #4   Title Patient will demonstrate the ability to ambulate at least 184ft with LRAD, no more than distant supervision, no loss of balance, and minimal fatigue throughout    PT LONG TERM GOAL #5   Title Patient will be independent in performing functional transfers, including all bed mobility skills and sit<->stand and stand pivot transfers with Mod(I) and LRAD    PT LONG TERM GOAL #6   Title Patient will demonstrate the ability to stand at least 15 minutes before fatiguing and with good posture and minimal loss of balance    PT LONG TERM GOAL #7   Title Patient will regularly perform weight bearing activities at home with assistance from caregivers as needed in order to protect skin and reduce detrimental effects of being sedentary                Plan -  05/18/15 1617    Clinical Impression Statement Continue PROM BIl LE to improve dorsiflexion and decrease rigidity BIl hips with reports of relief following manual stretches.  Held gait training on treadmill due to reports of increased back pain on treadmill.  Gait training was complete today with RW with min guard with improved foot placement noted.  Progressed glut med strengthening to improve weight shifting and balance during gait with HHA required due to weakness.  Pt limited by fatigue and reports of increased back pain fiollowing standing for long periods of time.     PT Next Visit Plan Continue with stretching of plantarflexors, strengthening of R dorsiflexion, gait training, and standing BLE strengthening to improve standing tolerance.  Continue gait training with treadmill for set cadence or addition of nustep to improve reciprocal movments and strength.         Problem List Patient Active Problem List   Diagnosis Date Noted  . Primary hypercoagulable state 03/19/2015  . Chronic respiratory failure 03/19/2015  . Insomnia 02/02/2015  . MRSA pneumonia 10/11/2014  . Abdominal abscess   . Candidemia   . Screen for STD (sexually transmitted disease)   . 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 Perforated gastric ulcer 09/27/2014  . Prediabetes 07/23/2014  . Unspecified hereditary and idiopathic peripheral neuropathy 04/15/2014  . CVA (cerebral infarction) 04/02/2014  . Hypertriglyceridemia 12/11/2013  . Stiffness of joint, not elsewhere classified, ankle and foot 11/12/2013  . Lack of coordination 11/03/2013  . Pernicious anemia 09/12/2013  . Elevated transaminase level 09/12/2013  . Ataxia 09/09/2013  . Difficulty in walking(719.7) 09/09/2013  . Weakness of both legs 09/09/2013  . Generalized weakness 02/11/2013  . Tobacco abuse 01/14/2013  . Pedal edema 01/13/2013  . Chronic pain syndrome 12/30/2012  . Reactive airway disease 12/30/2012   Ihor Austin, [place];  [place]  [name] 05/18/2015, 4:24 PM  [place] [address] [place], [place], [address] Phone: [phone]   Fax:  [phone]      

## 2015-05-18 NOTE — Therapy (Addendum)
[place], [place], [address] Phone: [phone]   Fax:  [phone]  Occupational Therapy Treatment  Patient Details  Name: [name] MRN: [id number] Date of Birth: 1965-12-03 Referring Provider:  Kathyrn Drown, MD  Encounter Date: 05/18/2015      OT End of Session - 05/18/15 1608    Visit Number 16   Number of Visits 24   Date for OT Re-Evaluation 07/10/15  Mini-reassessment 06/09/2015   Authorization Type Medicare A   Authorization Time Period before 20th visit   Authorization - Visit Number 17   Authorization - Number of Visits 20   OT Start Time 1431   OT Stop Time 1513   OT Time Calculation (min) 42 min   Activity Tolerance Patient tolerated treatment well   Behavior During Therapy [facility] for tasks assessed/performed      Past Medical History  Diagnosis Date  . Anxiety   . Depression   . COPD (chronic obstructive pulmonary disease)   . MS (multiple sclerosis)   . Fibromyalgia   . Impaired fasting glucose   . 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 Eating disorder   . Chronic low back pain   . PONV (postoperative nausea and vomiting)   . Chronic bronchitis     "yearly" (02/11/2013)  . Borderline diabetes   . GERD (gastroesophageal reflux disease)   . Migraine     "I use to" (02/11/2013)  . Arthritis     "hands & feet" (02/11/2013)  . Peripheral neuropathy     [name] 02/11/2013  . B12 deficiency anemia     [name] 02/11/2013  . Chronic pain syndrome     [name] 02/11/2013  . UTI (lower urinary tract infection) 02/11/2013    [name] 02/11/2013  . Chronic edema     BLE/notes 02/11/2013  . Ataxia   . Peripheral neuropathy   . Muscle weakness of lower extremity     bilateral  . Hypertension     Past Surgical History  Procedure Laterality Date  . Abdominal hysterectomy  ~ 1987; ~ 2004    "woodward; ferguson" (02/11/2013)  . Cesarean section  4315; 1984; 1986  . Anterior  cervical decomp/discectomy fusion  2009; 2011  . Tonsillectomy  1990's?  . Cataract extraction w/phaco  06/20/2011    Procedure: CATARACT EXTRACTION PHACO AND INTRAOCULAR LENS PLACEMENT (IOC);  Surgeon: [name] T. Gershon Crane;  Location: [facility];  Service: Ophthalmology;  Laterality: Left;  CDE 1.81  . Cataract extraction w/phaco  07/04/2011    Procedure: CATARACT EXTRACTION PHACO AND INTRAOCULAR LENS PLACEMENT (IOC);  Surgeon: [name] T. Gershon Crane;  [facility]: [facility];  Service: Ophthalmology;  Laterality: Right;  CDE: 1.76  . Yag laser application Right 4/00/8676    Procedure: YAG LASER APPLICATION;  Surgeon: [name] T. Gershon Crane, MD;  Location: [facility];  Service: Ophthalmology;  Laterality: Right;  . Yag laser application Left 1/95/0932    Procedure: YAG LASER APPLICATION;  Surgeon: [name] T. Gershon Crane, MD;  Location: [facility];  Service: Ophthalmology;  Laterality: Left;  . Appendectomy    . Laparotomy N/A 09/27/2014    Procedure: Exploratory Laparotomy, Biopsy of Perforated Gastric Ulcer, Closure with omental patch;  Surgeon: [name], MD;  Location: [place];  Service: General;  Laterality: N/A;  . Embolectomy Left 09/27/2014    Procedure: Left Brachial, Radial,Ulnar Embolectomy with patch angioplasty left brachial, radial and ulnar artery;  Surgeon: [name], MD;  Location: [place];  Service: Vascular;  Laterality: Left;  . Embolectomy Left 09/27/2014    Procedure: Left Radial, Brachial, and Ulnar Thrombectomy; Left Brachial to Radial Bypass Graft using Greater Saphenous vein graft from Left Thigh; Left Saphenous Vein Harvest; Intraoperative Arteriogram; Intra-arterial administration of TPA;  Surgeon: [name], MD;  Location: [facility] OR;  Service: Vascular;  Laterality: Left;  . Tracheostomy N/A december 2015    There were no vitals filed for this visit.  Visit Diagnosis:  Lack of coordination  Hand joint pain, left  Hand muscle weakness      Subjective Assessment - 05/18/15 1428    Subjective  S:  I went and got botox in my hand and I can move it more.    Currently in Pain? Yes   Pain Score 7    Pain Location Generalized  feet, legs, hands   Pain Orientation Right;Left   Pain Descriptors / Indicators Sore   Pain Type Acute pain            [facility] OT Assessment - 05/18/15 1427    Assessment   Diagnosis left hand spaticity   Precautions   Precautions Fall                  OT Treatments/Exercises (OP) - 05/18/15 1437    Exercises   Exercises Hand;Wrist   Fine Motor Coordination   Small Pegboard Pt completed colored pegboard this date focusing on left hand 2 point pinch. Pt had mod difficulty maintaining a 2 point pinch. Pt used tweezers to remove pegs with mod difficulty. Verbal cuing to watch fingers and focus on pinch.    Other Fine Motor Exercises Pt completed pinch activity using red resistive clothespin to grasp and place high resistance sponges into bucket. Pt used lateral with with left hand and 3 point pinch with right hand to pick up sponges. Pt was able to complete 8/30 with left hand. Pt required extended time to complete task, eventually stating it was too hard and she couldn't finish all the sponges today.                   OT Short Term Goals - 05/06/15 1311    OT SHORT TERM GOAL #1   Title Patient will be educated and independent with HEP.   Status On-going   OT SHORT TERM GOAL #2   Title Patient will increase AROM of left hand by 5 degrees to increase ability to use left hand during daily tasks.    Status Achieved   OT SHORT TERM GOAL #3   Title Therapist will fabricate finger extension splint to defer the chance of progressing spaticity.    OT SHORT TERM GOAL #4   Title Patient will increase coordination in Bil hands by decreasing completion time of  9 hole peg test by 10 seconds.    Status --   OT SHORT TERM GOAL #5   Title Patient will increase grip strength by 5# and pinch strength by 3#  of left hand to increase ability to use  left hand as an active assist during daily tasks.    Status Achieved           OT Long Term Goals - 05/06/15 1322    OT LONG TERM GOAL #1   Title Patient will return to highest level of independence with all daily activities.    Status On-going   OT LONG TERM GOAL #2   Title Patient will increase AROM of left hand  by 10 degrees.   Status On-going   OT LONG TERM GOAL #3   Title Patient will increase PROM of left hand to [facility] to decrease the progression of spaticity   Status Partially Met   OT LONG TERM GOAL #4   Title Patient will increase grip strength by 10# and pinch strength by 5# in LUE.   Status On-going   OT LONG TERM GOAL #5   Title Patient will increase Bil coordination by decreasing completion time of 9 hole peg test by 30 seconds.    Status Partially Met               Plan - 05/18/15 1610    Clinical Impression Statement A: Continued pinch and fine motor coordination activities this session. Pt had botox on Thursday and reports increased extension in left hand and digits. Pt had mod difficulty with fine motor tasks with and without tweezers.    Plan P: Continue [facility], pinch & grip exercises. wrist strengthening.         Problem List Patient Active Problem List   Diagnosis Date Noted  . Primary hypercoagulable state 03/19/2015  . Chronic respiratory failure 03/19/2015  . Insomnia 02/02/2015  . MRSA pneumonia 10/11/2014  . Abdominal abscess   . Candidemia   . Screen for STD (sexually transmitted disease)   . 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 Perforated gastric ulcer 09/27/2014  . Prediabetes 07/23/2014  . Unspecified hereditary and idiopathic peripheral neuropathy 04/15/2014  . CVA (cerebral infarction) 04/02/2014  . Hypertriglyceridemia 12/11/2013  . Stiffness of joint, not elsewhere classified, ankle and foot 11/12/2013  . Lack of coordination 11/03/2013  . Pernicious anemia 09/12/2013  . Elevated transaminase level 09/12/2013  . Ataxia  09/09/2013  . Difficulty in walking(719.7) 09/09/2013  . Weakness of both legs 09/09/2013  . Generalized weakness 02/11/2013  . Tobacco abuse 01/14/2013  . Pedal edema 01/13/2013  . Chronic pain syndrome 12/30/2012  . Reactive airway disease 12/30/2012    Guadelupe Sabin, OTR/L  [phone]  05/18/2015, 4:26 PM  [place] [address] [place], [place], [address] Phone: [phone]   Fax:  [phone]

## 2015-05-20 ENCOUNTER — Ambulatory Visit (HOSPITAL_COMMUNITY): Payer: BLUE CROSS/BLUE SHIELD | Admitting: Physical Therapy

## 2015-05-20 ENCOUNTER — Ambulatory Visit (HOSPITAL_COMMUNITY): Payer: BLUE CROSS/BLUE SHIELD | Admitting: Speech Pathology

## 2015-05-20 DIAGNOSIS — M25642 Stiffness of left hand, not elsewhere classified: Secondary | ICD-10-CM | POA: Diagnosis not present

## 2015-05-20 DIAGNOSIS — R471 Dysarthria and anarthria: Secondary | ICD-10-CM

## 2015-05-20 DIAGNOSIS — R270 Ataxia, unspecified: Secondary | ICD-10-CM

## 2015-05-20 DIAGNOSIS — M6289 Other specified disorders of muscle: Secondary | ICD-10-CM | POA: Diagnosis not present

## 2015-05-20 DIAGNOSIS — Z7409 Other reduced mobility: Secondary | ICD-10-CM

## 2015-05-20 DIAGNOSIS — R269 Unspecified abnormalities of gait and mobility: Secondary | ICD-10-CM

## 2015-05-20 DIAGNOSIS — R29898 Other symptoms and signs involving the musculoskeletal system: Secondary | ICD-10-CM | POA: Diagnosis not present

## 2015-05-20 DIAGNOSIS — R490 Dysphonia: Secondary | ICD-10-CM

## 2015-05-20 DIAGNOSIS — R279 Unspecified lack of coordination: Secondary | ICD-10-CM | POA: Diagnosis not present

## 2015-05-20 DIAGNOSIS — M79642 Pain in left hand: Secondary | ICD-10-CM | POA: Diagnosis not present

## 2015-05-20 NOTE — Therapy (Signed)
[place], [place], [address] Phone: [phone]   Fax:  [phone]  Speech Language Pathology Treatment  Patient Details  Name: [name] MRN: [id number] Date of Birth: July 22, 1966 Referring Provider:  Kathyrn Drown, MD  Encounter Date: 05/20/2015      End of Session - 05/20/15 1456    Visit Number 4   Number of Visits 8   Date for SLP Re-Evaluation 05/10/15   Authorization Type [employer]   Authorization Time Period 03/11/2015-05/10/2015   SLP Start Time 1352   SLP Stop Time  1433   SLP Time Calculation (min) 41 min   Activity Tolerance Patient tolerated treatment well      Past Medical History  Diagnosis Date  . Anxiety   . Depression   . COPD (chronic obstructive pulmonary disease)   . MS (multiple sclerosis)   . Fibromyalgia   . Impaired fasting glucose   . 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 Eating disorder   . Chronic low back pain   . PONV (postoperative nausea and vomiting)   . Chronic bronchitis     "yearly" (02/11/2013)  . Borderline diabetes   . GERD (gastroesophageal reflux disease)   . Migraine     "I use to" (02/11/2013)  . Arthritis     "hands & feet" (02/11/2013)  . Peripheral neuropathy     [name] 02/11/2013  . B12 deficiency anemia     [name] 02/11/2013  . Chronic pain syndrome     [name] 02/11/2013  . UTI (lower urinary tract infection) 02/11/2013    [name] 02/11/2013  . Chronic edema     BLE/notes 02/11/2013  . Ataxia   . Peripheral neuropathy   . Muscle weakness of lower extremity     bilateral  . Hypertension     Past Surgical History  Procedure Laterality Date  . Abdominal hysterectomy  ~ 1987; ~ 2004    "woodward; ferguson" (02/11/2013)  . Cesarean section  2800; 1984; 1986  . Anterior cervical decomp/discectomy fusion  2009; 2011  . Tonsillectomy  1990's?  . Cataract extraction w/phaco  06/20/2011    Procedure: CATARACT EXTRACTION PHACO AND  INTRAOCULAR LENS PLACEMENT (IOC);  Surgeon: [name] T. Gershon Crane;  Location: [facility];  Service: Ophthalmology;  Laterality: Left;  CDE 1.81  . Cataract extraction w/phaco  07/04/2011    Procedure: CATARACT EXTRACTION PHACO AND INTRAOCULAR LENS PLACEMENT (IOC);  Surgeon: [name] T. Gershon Crane;  Location: [facility];  Service: Ophthalmology;  Laterality: Right;  CDE: 1.76  . Yag laser application Right 3/49/1791    Procedure: YAG LASER APPLICATION;  Surgeon: [name] T. Gershon Crane, MD;  Location: [facility];  Service: Ophthalmology;  Laterality: Right;  . Yag laser application Left 02/10/6978    Procedure: YAG LASER APPLICATION;  Surgeon: [name] T. Gershon Crane, MD;  Location: [facility];  Service: Ophthalmology;  Laterality: Left;  . Appendectomy    . Laparotomy N/A 09/27/2014    Procedure: Exploratory Laparotomy, Biopsy of Perforated Gastric Ulcer, Closure with omental patch;  Surgeon: [name], MD;  Location: [facility];  Service: General;  Laterality: N/A;  . Embolectomy Left 09/27/2014    Procedure: Left Brachial, Radial,Ulnar Embolectomy with patch angioplasty left brachial, radial and ulnar artery;  Surgeon: [name], MD;  Location: [facility];  Service: Vascular;  Laterality: Left;  . Embolectomy Left 09/27/2014    Procedure: Left Radial, Brachial, and Ulnar Thrombectomy; Left Brachial to Radial Bypass Graft using Greater  Saphenous vein graft from Left Thigh; Left Saphenous Vein Harvest; Intraoperative Arteriogram; Intra-arterial administration of TPA;  Surgeon: [name], MD;  Location: [facility] OR;  Service: Vascular;  Laterality: Left;  . Tracheostomy N/A december 2015    There were no vitals filed for this visit.  Visit Diagnosis: Ataxic dysarthria  Dysphonia      Subjective Assessment - 05/20/15 1454    Subjective "I am doing pretty good."   Currently in Pain? No/denies               ADULT SLP TREATMENT - 05/20/15 1455    General Information   Behavior/Cognition Alert;Cooperative;Pleasant mood    Patient Positioning Upright in chair   Oral care provided N/A   HPI Patient was hospitalized in mid-December for an extended stay (also to [facility]). She was admitted into the hospital because of an intestinal rupture that at first was thought to be a small bowel rupture but then was found out to be a gastric small bowel ulcer with the perforation. 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 She had a trach placed which was removed at [place]. She has a history of chronic pain related to diabetic neuropathy as well as neuropathic pain in her legs and lumbar spine she is been on pain medications of oxycodone for years. The patient is also under the care of neurology at [facility]. 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 Pt with h/o bilateral globus pallidus injury "in setting of likely drug overdose" however toxicology screen was negative in June 2015. Pt with complex medical history which also includes trach and PEG in June 2014. SLP asked to evaluate and treat speech impairments.   Treatment Provided   Treatment provided Cognitive-Linquistic   Pain Assessment   Pain Assessment No/denies pain   Cognitive-Linquistic Treatment   Treatment focused on Dysarthria;Voice;Patient/family/caregiver education   Skilled Treatment breath support exercises, vocal fold adduction, speech intelligibility strategies   Assessment / Recommendations / Plan   Plan Discharge SLP treatment due to (comment)          SLP Education - 05/20/15 1456    Education provided Yes   Education Details HEP s/p D/C   Person(s) Educated Patient   Methods Explanation  handouts at home   Comprehension Verbalized understanding          SLP  Short Term Goals - 05/20/15 1507    SLP SHORT TERM GOAL #1   Title Pt will increase breath support to 10 seconds on exhalation tasks.   Baseline 7   Time 4   Period Weeks   Status Not Met   SLP SHORT TERM GOAL #2   Title Pt will implement speech intelligibility strategies with mild cues during structured sentence-level repetition and/or reading tasks as judged by clinician.   Baseline mod cues   Time 4   Period Weeks   Status Achieved   SLP SHORT TERM GOAL #3   Title Pt will participate in ~10-minute conversational tasks with SLP only asking for clarification of spoken words 2x.   Baseline 4x   Time 4   Period Weeks   Status Partially Met          SLP Long Term Goals - 05/20/15 1507  SLP LONG TERM GOAL #1   Title Pt will improve speech intelligiblity to [facility] with use of strategies and self correction prn during conversations in home/community setting.    Baseline Min impairment   Time 8   Period Weeks   Status Achieved          Plan - 06/08/15 1458    Clinical Impression Statement Ms. Labra was seen for speech therapy. This is her first session in over a month. She did not bring her therapy folder with her today, but reports she still has at home and continues to practice at home. She reports that her speech is "ok" and she does well on the phone. Occasionally people have to ask her to repeat herself. She reported that she had an MRI completed last month at the request of her neurologist at [facility] which showed no acute changes per pt. Pt knowledgeable about speech intelligibility strategies, but needs reminders to use during conversation. Vocal quality continues to be mildly harsh and monotone. No significance change in quality is noted from SLP since her evaluation in June. Her speech is more intellibible with use of strategies however. Recent MRI showed: Interval development of bilateral globus pallidi signal abnormality, which can be seen with remote global hypoxic ischemic  injury. Similar mild white matter disease most compatible with chronic microvascular ischemic changes. I suspect that the globus pallidus abnormality in conjunction with previous respiratory arrest with trach contribute to monotone/flat voice. Recommend discharge from skilled SLP services at this time with pt to focus on HEP (oral motor exercises, speech intelligibility strategies, and oral reading of multisyllabic words). Pt in agreement with plan of care.    Speech Therapy Frequency --   Duration --   Treatment/Interventions --   Potential to Achieve Goals --   Potential Considerations --   SLP Home Exercise Plan Pt will be independent with HEP to facilitate carryover of treatment strategies and techniques at home.    Consulted and Agree with Plan of Care Patient          G-Codes - June 08, 2015 1510    Functional Assessment Tool Used clinical judgement   Functional Limitations Motor speech   Motor Speech Goal Status [phone]) At least 1 percent but less than 20 percent impaired, limited or restricted   Motor Speech Goal Status (G6269) At least 1 percent but less than 20 percent impaired, limited or restricted      Problem List Patient Active Problem List   Diagnosis Date Noted  . Primary hypercoagulable state 03/19/2015  . Chronic respiratory failure 03/19/2015  . Insomnia 02/02/2015  . MRSA pneumonia 10/11/2014  . Abdominal abscess   . Candidemia   . Screen for STD (sexually transmitted disease)   . 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 Perforated gastric ulcer 09/27/2014  . Prediabetes 07/23/2014  . Unspecified hereditary and idiopathic peripheral neuropathy 04/15/2014  . CVA (cerebral infarction) 04/02/2014  . Hypertriglyceridemia 12/11/2013  . Stiffness of joint, not elsewhere classified, ankle and foot 11/12/2013  . Lack of coordination 11/03/2013  . Pernicious anemia 09/12/2013  . Elevated transaminase level 09/12/2013  . Ataxia 09/09/2013  . Difficulty in walking(719.7)  09/09/2013  . Weakness of both legs 09/09/2013  . Generalized weakness 02/11/2013  . Tobacco abuse 01/14/2013  . Pedal edema 01/13/2013  . Chronic pain syndrome 12/30/2012  . Reactive airway disease 12/30/2012   SPEECH THERAPY DISCHARGE SUMMARY  Visits from Start of Care: 4  Current functional level related to goals / functional outcomes:  Mild dysphonia and dysarthria impacting conversational speech; improved with use of speech intelligibility strategies   Remaining deficits: Mild dysphonia and dysarthria   Education / Equipment: HEP  Plan: Patient agrees to discharge.  Patient goals were partially met. Patient is being discharged due to being pleased with the current functional level.  ?????       Thank you,  Genene Churn, [place]  [facility] 05/20/2015, 3:12 PM  [place] [address] [place], [place], [address] Phone: [phone]   Fax:  [phone]

## 2015-05-20 NOTE — Therapy (Signed)
[facility] [place], [place], [address] Phone: [phone]   Fax:  [phone]  Physical Therapy Treatment  Patient Details  Name: [name] MRN: [id number] Date of Birth: 1966/02/02 Referring Provider:  Kathyrn Drown, MD  Encounter Date: 05/20/2015      PT End of Session - 05/20/15 1550    Visit Number 23   Number of Visits 24   Date for PT Re-Evaluation 06/06/15   Authorization Type Medicare    Authorization Time Period 04/06/15-06/06/15; G-code done 13th visit    Authorization - Visit Number 23   Authorization - Number of Visits 24   PT Start Time 4332   PT Stop Time 1515   PT Time Calculation (min) 40 min   Equipment Utilized During Treatment Gait belt   Activity Tolerance Patient tolerated treatment well   Behavior During Therapy [facility] for tasks assessed/performed      Past Medical History  Diagnosis Date  . Anxiety   . Depression   . COPD (chronic obstructive pulmonary disease)   . MS (multiple sclerosis)   . Fibromyalgia   . Impaired fasting glucose   . 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 Eating disorder   . Chronic low back pain   . PONV (postoperative nausea and vomiting)   . Chronic bronchitis     "yearly" (02/11/2013)  . Borderline diabetes   . GERD (gastroesophageal reflux disease)   . Migraine     "I use to" (02/11/2013)  . Arthritis     "hands & feet" (02/11/2013)  . Peripheral neuropathy     [name] 02/11/2013  . B12 deficiency anemia     [name] 02/11/2013  . Chronic pain syndrome     [name] 02/11/2013  . UTI (lower urinary tract infection) 02/11/2013    [name] 02/11/2013  . Chronic edema     BLE/notes 02/11/2013  . Ataxia   . Peripheral neuropathy   . Muscle weakness of lower extremity     bilateral  . Hypertension     Past Surgical History  Procedure Laterality Date  . Abdominal hysterectomy  ~ 1987; ~ 2004    "woodward; ferguson" (02/11/2013)  . Cesarean  section  9518; 1984; 1986  . Anterior cervical decomp/discectomy fusion  2009; 2011  . Tonsillectomy  1990's?  . Cataract extraction w/phaco  06/20/2011    Procedure: CATARACT EXTRACTION PHACO AND INTRAOCULAR LENS PLACEMENT (IOC);  Surgeon: [name] T. Gershon Crane;  Location: [facility];  Service: Ophthalmology;  Laterality: Left;  CDE 1.81  . Cataract extraction w/phaco  07/04/2011    Procedure: CATARACT EXTRACTION PHACO AND INTRAOCULAR LENS PLACEMENT (IOC);  Surgeon: [name] T. Gershon Crane;  Location: [facility];  Service: Ophthalmology;  Laterality: Right;  CDE: 1.76  . Yag laser application Right 8/41/6606    Procedure: YAG LASER APPLICATION;  Surgeon: [name] T. Gershon Crane, MD;  Location: [facility];  Service: Ophthalmology;  Laterality: Right;  . Yag laser application Left 12/07/6008    Procedure: YAG LASER APPLICATION;  Surgeon: [name] T. Gershon Crane, MD;  Location: [facility];  Service: Ophthalmology;  Laterality: Left;  . Appendectomy    . Laparotomy N/A 09/27/2014    Procedure: Exploratory Laparotomy, Biopsy of Perforated Gastric Ulcer, Closure with omental patch;  Surgeon: [name], MD;  Location: [facility];  Service: General;  Laterality: N/A;  . Embolectomy Left 09/27/2014    Procedure: Left Brachial, Radial,Ulnar Embolectomy with patch angioplasty left brachial, radial and ulnar artery;  Surgeon:  [name], MD;  Location: [facility];  Service: Vascular;  Laterality: Left;  . Embolectomy Left 09/27/2014    Procedure: Left Radial, Brachial, and Ulnar Thrombectomy; Left Brachial to Radial Bypass Graft using Greater Saphenous vein graft from Left Thigh; Left Saphenous Vein Harvest; Intraoperative Arteriogram; Intra-arterial administration of TPA;  Surgeon: [name], MD;  Location: [facility];  Service: Vascular;  Laterality: Left;  . Tracheostomy N/A december 2015    There were no vitals filed for this visit.  Visit Diagnosis:  Abnormality of gait  Weakness of both legs  Impaired functional mobility and activity  tolerance  Ataxia      Subjective Assessment - 05/20/15 1435    Subjective Pt reports that she is having increased pain in bilateral feet, hands, and legs. She reports that her LBP has decreased.    Currently in Pain? Yes   Pain Score 7    Pain Location Foot  hands, feet, and legs   Pain Orientation Right;Left                 [facility] Adult PT Treatment/Exercise - 05/20/15 0001    Ambulation/Gait   Ambulation/Gait Yes   Ambulation/Gait Assistance 4: Min guard   Ambulation Distance (Feet) 225 Feet   Assistive device Rolling walker   Knee/Hip Exercises: Stretches   Piriformis Stretch Both;60 seconds;3 reps   Piriformis Stretch Limitations supine    Gastroc Stretch Both;3 reps;30 seconds   Gastroc Stretch Limitations passive in supine   Other Knee/Hip Stretches hip abductor stretch 3x30"   Knee/Hip Exercises: Standing   Heel Raises 15 reps   Hip Abduction Both;1 set;10 reps   Lateral Step Up 10 reps;Hand Hold: 2;Step Height: 4"   Other Standing Knee Exercises sidestepping x 2 RT in // bars                  PT Short Term Goals - 05/18/15 1622    PT SHORT TERM GOAL #1   Title Patient will demonstrate a grade of at least 3+/5 in proximal musculature and core in order to promote functional stability and safe mobility skills    Status On-going   PT SHORT TERM GOAL #2   Title Patient will be able to participate in standing tasks with Min(A) from therapist and weights on limbs to reduce ataxia symptoms for at least 7 minutes at a time    Status On-going   PT SHORT TERM GOAL #3   Title Patient to experience no more than 4/10 at worst on a daily basis at rest and during mobility    Status On-going   PT SHORT TERM GOAL #4   Title Patient will demonstrate the ability to ambulate at least 81ft x6 in body weight support system with weighted extremities and minimal fatigue, good gait mechanics throughout    Status On-going   PT SHORT TERM GOAL #5   Title Patient will  be able to perform functional transfers, including sit<->stand and stand<->pivot from wheelchair to bed with close supervision and no loss of balance    Status On-going   PT SHORT TERM GOAL #6   Title Patient will demonstrate improved posture while seated in wheelchair and will be able to state and demonstrate the importance of performing appropriate pressure relieving techniques consistently    Status On-going           PT Long Term Goals - 05/18/15 1622    PT LONG TERM GOAL #1   Title Patient will  be independent in safely performed advanced HEP with assistance from caregivers as needed, updated PRN    PT LONG TERM GOAL #2   Title Patient will demonstrate at least 4/5 to 4+/5 muscle strength in all proximal muscles, bilateral lower extremities, and core    PT LONG TERM GOAL #3   Title Patient will demonstrate bilateral ankle DF of at least 10 degrees in order to improve overall gait mechanics and functional mobility skills    PT LONG TERM GOAL #4   Title Patient will demonstrate the ability to ambulate at least 150ft with LRAD, no more than distant supervision, no loss of balance, and minimal fatigue throughout    PT LONG TERM GOAL #5   Title Patient will be independent in performing functional transfers, including all bed mobility skills and sit<->stand and stand pivot transfers with Mod(I) and LRAD    PT LONG TERM GOAL #6   Title Patient will demonstrate the ability to stand at least 15 minutes before fatiguing and with good posture and minimal loss of balance    PT LONG TERM GOAL #7   Title Patient will regularly perform weight bearing activities at home with assistance from caregivers as needed in order to protect skin and reduce detrimental effects of being sedentary                Plan - 05/20/15 1551    Clinical Impression Statement Treatment focused on improving functional strength and standing tolerance. Pt was able to complete all standing therex in // bars with one  rest break and no c/o pain. Pt experienced fatigue after ambulating 225 feet, unable to attempt another bout of ambulation d/t fatigue.    PT Next Visit Plan Continue with standing strengthening, gait training on treadmill.         Problem List Patient Active Problem List   Diagnosis Date Noted  . Primary hypercoagulable state 03/19/2015  . Chronic respiratory failure 03/19/2015  . Insomnia 02/02/2015  . MRSA pneumonia 10/11/2014  . Abdominal abscess   . Candidemia   . Screen for STD (sexually transmitted disease)   . 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 Perforated gastric ulcer 09/27/2014  . Prediabetes 07/23/2014  . Unspecified hereditary and idiopathic peripheral neuropathy 04/15/2014  . CVA (cerebral infarction) 04/02/2014  . Hypertriglyceridemia 12/11/2013  . Stiffness of joint, not elsewhere classified, ankle and foot 11/12/2013  . Lack of coordination 11/03/2013  . Pernicious anemia 09/12/2013  . Elevated transaminase level 09/12/2013  . Ataxia 09/09/2013  . Difficulty in walking(719.7) 09/09/2013  . Weakness of both legs 09/09/2013  . Generalized weakness 02/11/2013  . Tobacco abuse 01/14/2013  . Pedal edema 01/13/2013  . Chronic pain syndrome 12/30/2012  . Reactive airway disease 12/30/2012    Hilma Favors, PT, DPT [phone] 05/20/2015, 3:57 PM  [facility] [address] [place], [place], [address] Phone: [phone]   Fax:  [phone]

## 2015-05-24 ENCOUNTER — Other Ambulatory Visit: Payer: Self-pay | Admitting: Family Medicine

## 2015-05-25 ENCOUNTER — Ambulatory Visit (HOSPITAL_COMMUNITY): Payer: BLUE CROSS/BLUE SHIELD | Admitting: Physical Therapy

## 2015-05-25 ENCOUNTER — Ambulatory Visit (HOSPITAL_COMMUNITY): Payer: BLUE CROSS/BLUE SHIELD

## 2015-05-25 DIAGNOSIS — M6281 Muscle weakness (generalized): Secondary | ICD-10-CM

## 2015-05-25 DIAGNOSIS — M79642 Pain in left hand: Secondary | ICD-10-CM | POA: Diagnosis not present

## 2015-05-25 DIAGNOSIS — R279 Unspecified lack of coordination: Secondary | ICD-10-CM

## 2015-05-25 DIAGNOSIS — R29898 Other symptoms and signs involving the musculoskeletal system: Secondary | ICD-10-CM | POA: Diagnosis not present

## 2015-05-25 DIAGNOSIS — R293 Abnormal posture: Secondary | ICD-10-CM

## 2015-05-25 DIAGNOSIS — M25642 Stiffness of left hand, not elsewhere classified: Secondary | ICD-10-CM | POA: Diagnosis not present

## 2015-05-25 DIAGNOSIS — M6289 Other specified disorders of muscle: Secondary | ICD-10-CM | POA: Diagnosis not present

## 2015-05-25 DIAGNOSIS — R269 Unspecified abnormalities of gait and mobility: Secondary | ICD-10-CM

## 2015-05-25 DIAGNOSIS — Z7409 Other reduced mobility: Secondary | ICD-10-CM

## 2015-05-25 DIAGNOSIS — R270 Ataxia, unspecified: Secondary | ICD-10-CM

## 2015-05-25 NOTE — Therapy (Signed)
[place], [place], [address] Phone: [phone]   Fax:  [phone]  Physical Therapy Treatment (Re-Assessment)  Patient Details  Name: [name] MRN: [id number] Date of Birth: [birth year] Referring Provider:  Kathyrn Drown, MD  Encounter Date: 05/25/2015      PT End of Session - 05/25/15 1842    Visit Number 24   Number of Visits 32   Date for PT Re-Evaluation 06/22/15   Authorization Type Medicare    Authorization Time Period 04/06/15-06/06/15; G-code done 24th visit    Authorization - Visit Number 24   Authorization - Number of Visits 34   PT Start Time 8333   PT Stop Time 1435   PT Time Calculation (min) 43 min   Equipment Utilized During Treatment Gait belt   Activity Tolerance Patient tolerated treatment well   Behavior During Therapy [facility] for tasks assessed/performed      Past Medical History  Diagnosis Date  . Anxiety   . Depression   . COPD (chronic obstructive pulmonary disease)   . MS (multiple sclerosis)   . Fibromyalgia   . Impaired fasting glucose   . 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 Eating disorder   . Chronic low back pain   . PONV (postoperative nausea and vomiting)   . Chronic bronchitis     "yearly" (02/11/2013)  . Borderline diabetes   . GERD (gastroesophageal reflux disease)   . Migraine     "I use to" (02/11/2013)  . Arthritis     "hands & feet" (02/11/2013)  . Peripheral neuropathy     [name] 02/11/2013  . B12 deficiency anemia     [name] 02/11/2013  . Chronic pain syndrome     [name] 02/11/2013  . UTI (lower urinary tract infection) 02/11/2013    [name] 02/11/2013  . Chronic edema     BLE/notes 02/11/2013  . Ataxia   . Peripheral neuropathy   . Muscle weakness of lower extremity     bilateral  . Hypertension     Past Surgical History  Procedure Laterality Date  . Abdominal hysterectomy  ~ 1987; ~ 2004    "woodward; ferguson"  (02/11/2013)  . Cesarean section  8329; 1984; 1986  . Anterior cervical decomp/discectomy fusion  2009; 2011  . Tonsillectomy  1990's?  . Cataract extraction w/phaco  06/20/2011    Procedure: CATARACT EXTRACTION PHACO AND INTRAOCULAR LENS PLACEMENT (IOC);  Surgeon: [name] T. Gershon Crane;  [facility];  Service: Ophthalmology;  Laterality: Left;  CDE 1.81  . Cataract extraction w/phaco  07/04/2011    Procedure: CATARACT EXTRACTION PHACO AND INTRAOCULAR LENS PLACEMENT (IOC);  Surgeon: [name] T. Gershon Crane;  Location: [facility];  Service: Ophthalmology;  Laterality: Right;  CDE: 1.76  . Yag laser application Right 1/91/6606    Procedure: YAG LASER APPLICATION;  Surgeon: [name] T. Gershon Crane, MD;  [facility];  Service: Ophthalmology;  Laterality: Right;  . Yag laser application Left 0/01/5996    Procedure: YAG LASER APPLICATION;  Surgeon: [name] T. Gershon Crane, MD;  [facility];  Service: Ophthalmology;  Laterality: Left;  . Appendectomy    . Laparotomy N/A 09/27/2014    Procedure: Exploratory Laparotomy, Biopsy of Perforated Gastric Ulcer, Closure with omental patch;  Surgeon: [name], MD;  Location: [facility];  Service: General;  Laterality: N/A;  . Embolectomy Left 09/27/2014    Procedure: Left Brachial, Radial,Ulnar Embolectomy with patch angioplasty left brachial, radial and ulnar artery;  Surgeon: [name], MD;  Location: [facility] OR;  Service: Vascular;  Laterality: Left;  . Embolectomy Left 09/27/2014    Procedure: Left Radial, Brachial, and Ulnar Thrombectomy; Left Brachial to Radial Bypass Graft using Greater Saphenous vein graft from Left Thigh; Left Saphenous Vein Harvest; Intraoperative Arteriogram; Intra-arterial administration of TPA;  Surgeon: [name], MD;  Location: [facility] OR;  Service: Vascular;  Laterality: Left;  . Tracheostomy N/A december 2015    There were no vitals filed for this visit.  Visit Diagnosis:  Weakness of both legs - Plan: PT plan of care cert/re-cert  Lack of  coordination - Plan: PT plan of care cert/re-cert  Abnormality of gait - Plan: PT plan of care cert/re-cert  Impaired functional mobility and activity tolerance - Plan: PT plan of care cert/re-cert  Ataxia - Plan: PT plan of care cert/re-cert  Proximal muscle weakness - Plan: PT plan of care cert/re-cert  Poor posture - Plan: PT plan of care cert/re-cert      Subjective Assessment - 05/25/15 1401    Subjective Patient reports she feels like she is getting better overall, walking is still hard for her through. States that she wants to keep working on her walking.    Pertinent History Patient reports that she became very ill in December of last year with a major blood clot in her arm, for which she required two surgeries. Patient states she remained ill for about 3 months; she also states that before she got sick at the end of the year, she was able to get up and walk, didn't need any help for transfers either. Patient reports that her MDs suspect that she may have MS but that she doesn't agree with them.    Currently in Pain? Yes   Pain Score 4    Pain Location Other (Comment)  feet and hands    Pain Orientation Right;Left            [facility] PT Assessment - 05/25/15 0001    Assessment   Medical Diagnosis weakness; ataxia    Onset Date/Surgical Date --  chronic    Next MD Visit 14th of September with Dr. [name]    Precautions   Precautions Fall   Balance Screen   Has the patient fallen in the past 6 months No   Has the patient had a decrease in activity level because of a fear of falling?  No   Is the patient reluctant to leave their home because of a fear of falling?  No   Prior Function   Level of Independence Needs assistance with gait;Needs assistance with transfers;Needs assistance with ADLs   Vocation On disability   AROM   Right Ankle Dorsiflexion -3   Left Ankle Dorsiflexion -3   Strength   Right Hip Flexion 3+/5   Right Hip Extension 2+/5   Left Hip Flexion 4/5    Left Hip Extension 2+/5   Left Hip ABduction 3-/5   Right Knee Flexion 4/5   Right Knee Extension 4+/5   Left Knee Flexion 4/5   Left Knee Extension 4+/5   Right Ankle Dorsiflexion 3+/5   Left Ankle Dorsiflexion 4+/5   Bed Mobility   Rolling Right 5: Supervision   Rolling Left 5: Supervision   Supine to Sit 4: Min guard   Sit to Supine 5: Supervision   Transfers   Transfers Sit to Stand   Stand to Sit Details S-Min guard    Ambulation/Gait   Ambulation/Gait Yes  Ambulation/Gait Assistance 4: Min guard;4: Min Administrator (Feet) 225 Feet   Assistive device Rolling walker   Gait Comments one LOB that required Min(A) to correct                              PT Education - 05/25/15 1842    Education provided Yes   Education Details plan of care moving forward, progress with skilled PT services    Person(s) Educated Patient   Methods Explanation   Comprehension Verbalized understanding          PT Short Term Goals - 05/25/15 1422    PT SHORT TERM GOAL #1   Title Patient will demonstrate a grade of at least 3+/5 in proximal musculature and core in order to promote functional stability and safe mobility skills    Time 6   Period Weeks   Status On-going   PT SHORT TERM GOAL #2   Title Patient will be able to participate in standing tasks with Min(guard) from therapist  for at least 7 minutes at a time    Baseline 8/16- standing is still difficult, not using weights on limbs anymroe    Time 6   Period Weeks   Status Revised   PT SHORT TERM GOAL #3   Title Patient to experience no more than 4/10 at worst on a daily basis at rest and during mobility    Baseline 8/16- pain is worse later in day    Time 6   Period Weeks   Status On-going   PT SHORT TERM GOAL #4   Title Patient will demonstrate the ability to ambulate at least 4f x6 in body weight support system with weighted extremities and minimal fatigue, good gait mechanics  throughout    Baseline 8/16- not using BW support system a lot during skilled PT sessions    Time 6   Period Weeks   Status Deferred   PT SHORT TERM GOAL #5   Title Patient will be able to perform functional transfers, including sit<->stand and stand<->pivot from wheelchair to bed with close supervision and no loss of balance    Baseline 8/16- still needs MIn(A) but getting easier    Time 6   Period Weeks   Status On-going   PT SHORT TERM GOAL #6   Title Patient will demonstrate improved posture while seated in wheelchair and will be able to state and demonstrate the importance of performing appropriate pressure relieving techniques consistently    Baseline 8/15- patient reports this is all going very well    Time 6   Period Weeks   Status Achieved           PT Long Term Goals - 05/25/15 1426    PT LONG TERM GOAL #1   Title Patient will be independent in safely performed advanced HEP with assistance from caregivers as needed, updated PRN    Time 12   Period Weeks   Status Achieved   PT LONG TERM GOAL #2   Title Patient will demonstrate at least 4/5 to 4+/5 muscle strength in all proximal muscles, bilateral lower extremities, and core    Time 12   Period Weeks   Status On-going   PT LONG TERM GOAL #3   Title Patient will demonstrate bilateral ankle DF of at least 10 degrees in order to improve overall gait mechanics and functional mobility skills    Time 12  Period Weeks   Status On-going   PT LONG TERM GOAL #4   Title Patient will demonstrate the ability to ambulate at least 165f with LRAD, no more than distant supervision, no loss of balance, and minimal fatigue throughout    Baseline 82024/09/09 still using rolling walker, still somewhat unsteady    Time 12   Period Weeks   Status Partially Met   PT LONG TERM GOAL #5   Title Patient will be independent in performing functional transfers, including all bed mobility skills and sit<->stand and stand pivot transfers with  Mod(I) and LRAD    Time 12   Period Weeks   Status On-going   PT LONG TERM GOAL #6   Title Patient will demonstrate the ability to stand at least 15 minutes before fatiguing and with good posture and minimal loss of balance    Time 12   Period Weeks   Status On-going   PT LONG TERM GOAL #7   Title Patient will regularly perform weight bearing activities at home with assistance from caregivers as needed in order to protect skin and reduce detrimental effects of being sedentary    Baseline 82024-09-09 patient reports this is going well at home    Time 12   Period Weeks   Status Achieved               Plan - 009/09/161843    Clinical Impression Statement Re-assessment performed today; patient shows improvements in functional mobilty and in general gait tolerance however continues to demonstrate reduced functional activity tolerance, impaired balance, postural and gait impairments, and does continue to present as a fall risk. Patient reports that she feels like she is improving  but wants to improve her ambulation and balance, also reports she does occasionally have some LOB but not as often as before. At this time patient will benefit from continueation of skileld PT services in order to continue to address her impairments and assist her in reaching an optimal level of function.    Pt will benefit from skilled therapeutic intervention in order to improve on the following deficits Abnormal gait;Decreased coordination;Decreased range of motion;Difficulty walking;Impaired flexibility;Improper body mechanics;Decreased endurance;Decreased safety awareness;Impaired sensation;Postural dysfunction;Decreased activity tolerance;Decreased balance;Decreased knowledge of use of DME;Pain;Decreased mobility;Decreased strength   Rehab Potential Good   Clinical Impairments Affecting Rehab Potential chronic ataxia symptoms however patient has reportedly been able to return to walking after other bouts of illness     PT Frequency 2x / week   PT Duration 8 weeks   PT Treatment/Interventions ADLs/Self Care Home Management;Gait training;Neuromuscular re-education;Visual/perceptual remediation/compensation;Stair training;Biofeedback;Functional mobility training;Patient/family education;Passive range of motion;Cryotherapy;Therapeutic activities;Wheelchair mobility training;Electrical Stimulation;Therapeutic exercise;Manual techniques;Energy conservation;DME Instruction;Balance training   PT Next Visit Plan Continue with standing strengthening, gait training on treadmill. Increased focus on balance training specifically.    PT Home Exercise Plan No updates   Consulted and Agree with Plan of Care Patient          G-Codes - 009/09/20161848    Functional Assessment Tool Used FOTO not performed this session; Clinical judgment used to assess based on strength, functional mobility, balance, gait, functional activity tolerance    Functional Limitation Mobility: Walking and moving around   Mobility: Walking and Moving Around Current Status ((E1007 At least 40 percent but less than 60 percent impaired, limited or restricted   Mobility: Walking and Moving Around Goal Status ((H2197 At least 20 percent but less than 40 percent impaired, limited or restricted  Problem List Patient Active Problem List   Diagnosis Date Noted  . Primary hypercoagulable state 03/19/2015  . Chronic respiratory failure 03/19/2015  . Insomnia 02/02/2015  . MRSA pneumonia 10/11/2014  . Abdominal abscess   . Candidemia   . Screen for STD (sexually transmitted disease)   . 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 Perforated gastric ulcer 09/27/2014  . Prediabetes 07/23/2014  . Unspecified hereditary and idiopathic peripheral neuropathy 04/15/2014  . CVA (cerebral infarction) 04/02/2014  . Hypertriglyceridemia 12/11/2013  . Stiffness of joint, not elsewhere classified, ankle and foot 11/12/2013  . Lack of coordination 11/03/2013  .  Pernicious anemia 09/12/2013  . Elevated transaminase level 09/12/2013  . Ataxia 09/09/2013  . Difficulty in walking(719.7) 09/09/2013  . Weakness of both legs 09/09/2013  . Generalized weakness 02/11/2013  . Tobacco abuse 01/14/2013  . Pedal edema 01/13/2013  . Chronic pain syndrome 12/30/2012  . Reactive airway disease 12/30/2012   Physical Therapy Progress Note  Dates of Reporting Period: 03/04/15 to 05/25/15  Objective Reports of Subjective Statement: Patient repports that she feels she is improving hwoever would like to keep working on her balance and gait   Objective Measurements: see above   Goal Update: see above   Plan: see above   Reason Skilled Services are Required: gait and psotural trainng, balance training, functional activity toelrance, addressing unsteadiness     Deniece Ree PT, DPT [place], [place], [address] Phone: [phone]   Fax:  [phone]

## 2015-05-25 NOTE — Therapy (Signed)
[place], [place], [address] Phone: [phone]   Fax:  [phone]  Occupational Therapy Treatment  Patient Details  Name: [name] MRN: [id number] Date of Birth: 08-20-1966 Referring Provider:  Kathyrn Drown, MD  Encounter Date: 05/25/2015      OT End of Session - 05/25/15 1646    Visit Number 17   Number of Visits 24   Date for OT Re-Evaluation 07/10/15  Mini-reassessment 06/09/2015   Authorization Type Medicare A   Authorization Time Period before 20th visit   Authorization - Visit Number 23   Authorization - Number of Visits 20   OT Start Time 1433   OT Stop Time 1515   OT Time Calculation (min) 42 min   Activity Tolerance Patient tolerated treatment well   Behavior During Therapy [facility] for tasks assessed/performed      Past Medical History  Diagnosis Date  . Anxiety   . Depression   . COPD (chronic obstructive pulmonary disease)   . MS (multiple sclerosis)   . Fibromyalgia   . Impaired fasting glucose   . 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 Eating disorder   . Chronic low back pain   . PONV (postoperative nausea and vomiting)   . Chronic bronchitis     "yearly" (02/11/2013)  . Borderline diabetes   . GERD (gastroesophageal reflux disease)   . Migraine     "I use to" (02/11/2013)  . Arthritis     "hands & feet" (02/11/2013)  . Peripheral neuropathy     [name] 02/11/2013  . B12 deficiency anemia     [name] 02/11/2013  . Chronic pain syndrome     [name] 02/11/2013  . UTI (lower urinary tract infection) 02/11/2013    [name] 02/11/2013  . Chronic edema     BLE/notes 02/11/2013  . Ataxia   . Peripheral neuropathy   . Muscle weakness of lower extremity     bilateral  . Hypertension     Past Surgical History  Procedure Laterality Date  . Abdominal hysterectomy  ~ 1987; ~ 2004    "woodward; ferguson" (02/11/2013)  . Cesarean section  5974; 1984; 1986  . Anterior  cervical decomp/discectomy fusion  2009; 2011  . Tonsillectomy  1990's?  . Cataract extraction w/phaco  06/20/2011    Procedure: CATARACT EXTRACTION PHACO AND INTRAOCULAR LENS PLACEMENT (IOC);  Surgeon: [name] T. Gershon Crane;  [facility];  Service: Ophthalmology;  Laterality: Left;  CDE 1.81  . Cataract extraction w/phaco  07/04/2011    Procedure: CATARACT EXTRACTION PHACO AND INTRAOCULAR LENS PLACEMENT (IOC);  Surgeon: [name] T. Gershon Crane;  Location: [facility];  Service: Ophthalmology;  Laterality: Right;  CDE: 1.76  . Yag laser application Right 1/63/8453    Procedure: YAG LASER APPLICATION;  Surgeon: [name] T. Gershon Crane, MD;  [facility];  Service: Ophthalmology;  Laterality: Right;  . Yag laser application Left 6/46/8032    Procedure: YAG LASER APPLICATION;  Surgeon: [name] T. Gershon Crane, MD;  [facility];  Service: Ophthalmology;  Laterality: Left;  . Appendectomy    . Laparotomy N/A 09/27/2014    Procedure: Exploratory Laparotomy, Biopsy of Perforated Gastric Ulcer, Closure with omental patch;  Surgeon: [name], MD;  Location: [facility];  Service: General;  Laterality: N/A;  . Embolectomy Left 09/27/2014    Procedure: Left Brachial, Radial,Ulnar Embolectomy with patch angioplasty left brachial, radial and ulnar artery;  Surgeon: [name], MD;  Location: [facility];  Service: Vascular;  Laterality: Left;  . Embolectomy Left 09/27/2014    Procedure: Left Radial, Brachial, and Ulnar Thrombectomy; Left Brachial to Radial Bypass Graft using Greater Saphenous vein graft from Left Thigh; Left Saphenous Vein Harvest; Intraoperative Arteriogram; Intra-arterial administration of TPA;  Surgeon: [name], MD;  [facility];  Service: Vascular;  Laterality: Left;  . Tracheostomy N/A december 2015    There were no vitals filed for this visit.  Visit Diagnosis:  Weakness of both legs  Lack of coordination  Hand muscle weakness      Subjective Assessment - 05/25/15 1625    Subjective  S:  I notice that my hand is loosening up.   Currently in Pain? Yes   Pain Score 4    Pain Location --  legs and hands   Pain Orientation Right;Left   Pain Descriptors / Indicators Sore   Pain Type Acute pain                      OT Treatments/Exercises (OP) - 05/25/15 1627    Exercises   Exercises Hand;Wrist   Additional Wrist Exercises   Theraputty - Pinch yellow 10 beads   Hand Exercises   Other Hand Exercises Pt participated in Sequence card game with focus on pinch strength and coordination needed to hold cards in hand and pick up poker chips to place on board. Mod difficulty using left hand when picking up pieces and min difficulty when using right hand. Pt required OTR/L to set up cards in hand.                    OT Short Term Goals - 05/25/15 1649    OT SHORT TERM GOAL #1   Title Patient will be educated and independent with HEP.   Status On-going   OT SHORT TERM GOAL #2   Title Patient will increase AROM of left hand by 5 degrees to increase ability to use left hand during daily tasks.    OT SHORT TERM GOAL #3   Title Therapist will fabricate finger extension splint to defer the chance of progressing spaticity.    OT SHORT TERM GOAL #4   Title Patient will increase coordination in Bil hands by decreasing completion time of  9 hole peg test by 10 seconds.    OT SHORT TERM GOAL #5   Title Patient will increase grip strength by 5# and pinch strength by 3#  of left hand to increase ability to use left hand as an active assist during daily tasks.            OT Long Term Goals - 05/25/15 1649    OT LONG TERM GOAL #1   Title Patient will return to highest level of independence with all daily activities.    Status On-going   OT LONG TERM GOAL #2   Title Patient will increase AROM of left hand by 10 degrees.   Status On-going   OT LONG TERM GOAL #3   Title Patient will increase PROM of left hand to [facility] to decrease the progression of spaticity    Status Partially Met   OT LONG TERM GOAL #4   Title Patient will increase grip strength by 10# and pinch strength by 5# in LUE.   Status On-going   OT LONG TERM GOAL #5   Title Patient will increase Bil coordination by decreasing completion time of 9 hole peg test by 30 seconds.  Status Partially Met               Plan - 05/25/15 1646    Clinical Impression Statement A: Pt completed fine motor coordination and pinch strength activity while playing the game Sequence.    Plan P: Play Sequence again to focus on pinch strength and fine motor coordination.        Problem List Patient Active Problem List   Diagnosis Date Noted  . Primary hypercoagulable state 03/19/2015  . Chronic respiratory failure 03/19/2015  . Insomnia 02/02/2015  . MRSA pneumonia 10/11/2014  . Abdominal abscess   . Candidemia   . Screen for STD (sexually transmitted disease)   . 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 Perforated gastric ulcer 09/27/2014  . Prediabetes 07/23/2014  . Unspecified hereditary and idiopathic peripheral neuropathy 04/15/2014  . CVA (cerebral infarction) 04/02/2014  . Hypertriglyceridemia 12/11/2013  . Stiffness of joint, not elsewhere classified, ankle and foot 11/12/2013  . Lack of coordination 11/03/2013  . Pernicious anemia 09/12/2013  . Elevated transaminase level 09/12/2013  . Ataxia 09/09/2013  . Difficulty in walking(719.7) 09/09/2013  . Weakness of both legs 09/09/2013  . Generalized weakness 02/11/2013  . Tobacco abuse 01/14/2013  . Pedal edema 01/13/2013  . Chronic pain syndrome 12/30/2012  . Reactive airway disease 12/30/2012    Ailene Ravel, OTR/L,CBIS  [phone]  05/25/2015, 4:49 PM  [place] [address] [place], [place], [address] Phone: [phone]   Fax:  [phone]

## 2015-05-26 ENCOUNTER — Telehealth: Payer: Self-pay | Admitting: Family Medicine

## 2015-05-26 NOTE — Telephone Encounter (Signed)
Please inform the patient her sleep study was negative. No sleep apnea. Patient and should be instructed to keep all regular follow-ups she should have a follow-up visit this fall

## 2015-05-27 ENCOUNTER — Ambulatory Visit (HOSPITAL_COMMUNITY): Payer: BLUE CROSS/BLUE SHIELD | Admitting: Physical Therapy

## 2015-05-27 ENCOUNTER — Ambulatory Visit (HOSPITAL_COMMUNITY): Payer: BLUE CROSS/BLUE SHIELD | Admitting: Specialist

## 2015-05-27 DIAGNOSIS — M6289 Other specified disorders of muscle: Secondary | ICD-10-CM | POA: Diagnosis not present

## 2015-05-27 DIAGNOSIS — R279 Unspecified lack of coordination: Secondary | ICD-10-CM

## 2015-05-27 DIAGNOSIS — Z7409 Other reduced mobility: Secondary | ICD-10-CM

## 2015-05-27 DIAGNOSIS — R270 Ataxia, unspecified: Secondary | ICD-10-CM

## 2015-05-27 DIAGNOSIS — R29898 Other symptoms and signs involving the musculoskeletal system: Secondary | ICD-10-CM | POA: Diagnosis not present

## 2015-05-27 DIAGNOSIS — R269 Unspecified abnormalities of gait and mobility: Secondary | ICD-10-CM

## 2015-05-27 DIAGNOSIS — M6281 Muscle weakness (generalized): Secondary | ICD-10-CM

## 2015-05-27 DIAGNOSIS — M25642 Stiffness of left hand, not elsewhere classified: Secondary | ICD-10-CM | POA: Diagnosis not present

## 2015-05-27 DIAGNOSIS — M79642 Pain in left hand: Secondary | ICD-10-CM | POA: Diagnosis not present

## 2015-05-27 NOTE — Telephone Encounter (Signed)
[facility]

## 2015-05-27 NOTE — Therapy (Signed)
[place], [place], [address] Phone: [phone]   Fax:  [phone]  Occupational Therapy Treatment  Patient Details  Name: [name] MRN: [id number] Date of Birth: 02-22-1966 Referring Provider:  Kathyrn Drown, MD  Encounter Date: 05/27/2015      OT End of Session - 05/27/15 1537    Visit Number 18   Number of Visits 24   Date for OT Re-Evaluation 07/10/15  mini reassess on 06/09/15   Authorization Type Medicare A   Authorization Time Period before 20th visit   Authorization - Visit Number 76   Authorization - Number of Visits 20   OT Start Time 1430   OT Stop Time 1515   OT Time Calculation (min) 45 min   Activity Tolerance Patient tolerated treatment well   Behavior During Therapy [facility] for tasks assessed/performed      Past Medical History  Diagnosis Date  . Anxiety   . Depression   . COPD (chronic obstructive pulmonary disease)   . MS (multiple sclerosis)   . Fibromyalgia   . Impaired fasting glucose   . 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 Eating disorder   . Chronic low back pain   . PONV (postoperative nausea and vomiting)   . Chronic bronchitis     "yearly" (02/11/2013)  . Borderline diabetes   . GERD (gastroesophageal reflux disease)   . Migraine     "I use to" (02/11/2013)  . Arthritis     "hands & feet" (02/11/2013)  . Peripheral neuropathy     [name] 02/11/2013  . B12 deficiency anemia     [name] 02/11/2013  . Chronic pain syndrome     [name] 02/11/2013  . UTI (lower urinary tract infection) 02/11/2013    [name] 02/11/2013  . Chronic edema     BLE/notes 02/11/2013  . Ataxia   . Peripheral neuropathy   . Muscle weakness of lower extremity     bilateral  . Hypertension     Past Surgical History  Procedure Laterality Date  . Abdominal hysterectomy  ~ 1987; ~ 2004    "woodward; ferguson" (02/11/2013)  . Cesarean section  0109; 1984; 1986  . Anterior  cervical decomp/discectomy fusion  2009; 2011  . Tonsillectomy  1990's?  . Cataract extraction w/phaco  06/20/2011    Procedure: CATARACT EXTRACTION PHACO AND INTRAOCULAR LENS PLACEMENT (IOC);  Surgeon: [name] T. Gershon Crane;  Location: [facility];  Service: Ophthalmology;  Laterality: Left;  CDE 1.81  . Cataract extraction w/phaco  07/04/2011    Procedure: CATARACT EXTRACTION PHACO AND INTRAOCULAR LENS PLACEMENT (IOC);  Surgeon: [name] T. Gershon Crane;  Location: [facility];  Service: Ophthalmology;  Laterality: Right;  CDE: 1.76  . Yag laser application Right 12/30/5571    Procedure: YAG LASER APPLICATION;  Surgeon: [name] T. Gershon Crane, MD;  Location: [facility];  Service: Ophthalmology;  Laterality: Right;  . Yag laser application Left 11/28/2540    Procedure: YAG LASER APPLICATION;  Surgeon: [name] T. Gershon Crane, MD;  Location: [facility];  Service: Ophthalmology;  Laterality: Left;  . Appendectomy    . Laparotomy N/A 09/27/2014    Procedure: Exploratory Laparotomy, Biopsy of Perforated Gastric Ulcer, Closure with omental patch;  Surgeon: [name], MD;  Location: [place];  Service: General;  Laterality: N/A;  . Embolectomy Left 09/27/2014    Procedure: Left Brachial, Radial,Ulnar Embolectomy with patch angioplasty left brachial, radial and ulnar artery;  Surgeon: [name], MD;  Location:  [facility];  Service: Vascular;  Laterality: Left;  . Embolectomy Left 09/27/2014    Procedure: Left Radial, Brachial, and Ulnar Thrombectomy; Left Brachial to Radial Bypass Graft using Greater Saphenous vein graft from Left Thigh; Left Saphenous Vein Harvest; Intraoperative Arteriogram; Intra-arterial administration of TPA;  Surgeon: [name], MD;  Location: [facility] OR;  Service: Vascular;  Laterality: Left;  . Tracheostomy N/A december 2015    There were no vitals filed for this visit.  Visit Diagnosis:  Lack of coordination  Hand muscle weakness      Subjective Assessment - 05/27/15 1532    Subjective  S:  I think it is a little  easier to hold the cards this time.  I still can't hold them fanned out though.   Currently in Pain? No/denies                      OT Treatments/Exercises (OP) - 05/27/15 1535    Hand Exercises   Other Hand Exercises Pt participated in Sequence card game with focus on pinch strength and coordination needed to hold cards in hand and pick up poker chips to place on board. Mod difficulty using left hand when picking up pieces and min difficulty when using right hand. Pt able to place and hold cards in her hand independently this date in a modified fan position.  Patient able to open both ziplock bags and empty chip contents out independently.                   OT Short Term Goals - 05/25/15 1649    OT SHORT TERM GOAL #1   Title Patient will be educated and independent with HEP.   Status On-going   OT SHORT TERM GOAL #2   Title Patient will increase AROM of left hand by 5 degrees to increase ability to use left hand during daily tasks.    OT SHORT TERM GOAL #3   Title Therapist will fabricate finger extension splint to defer the chance of progressing spaticity.    OT SHORT TERM GOAL #4   Title Patient will increase coordination in Bil hands by decreasing completion time of  9 hole peg test by 10 seconds.    OT SHORT TERM GOAL #5   Title Patient will increase grip strength by 5# and pinch strength by 3#  of left hand to increase ability to use left hand as an active assist during daily tasks.            OT Long Term Goals - 05/25/15 1649    OT LONG TERM GOAL #1   Title Patient will return to highest level of independence with all daily activities.    Status On-going   OT LONG TERM GOAL #2   Title Patient will increase AROM of left hand by 10 degrees.   Status On-going   OT LONG TERM GOAL #3   Title Patient will increase PROM of left hand to [facility] to decrease the progression of spaticity   Status Partially Met   OT LONG TERM GOAL #4   Title Patient will  increase grip strength by 10# and pinch strength by 5# in LUE.   Status On-going   OT LONG TERM GOAL #5   Title Patient will increase Bil coordination by decreasing completion time of 9 hole peg test by 30 seconds.    Status Partially Met  Plan - 05/27/15 1538    Clinical Impression Statement A:  Patient able to pick up and hold cards in both hands in a modified fan position, previously, patient required assistance to hold cards.   Plan P:  Complete simple cooking task focusing on grip, pinch and GMC needed to open containers and prepare food item.   Consulted and Agree with Plan of Care Patient        Problem List Patient Active Problem List   Diagnosis Date Noted  . Primary hypercoagulable state 03/19/2015  . Chronic respiratory failure 03/19/2015  . Insomnia 02/02/2015  . MRSA pneumonia 10/11/2014  . Abdominal abscess   . Candidemia   . Screen for STD (sexually transmitted disease)   . 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 Perforated gastric ulcer 09/27/2014  . Prediabetes 07/23/2014  . Unspecified hereditary and idiopathic peripheral neuropathy 04/15/2014  . CVA (cerebral infarction) 04/02/2014  . Hypertriglyceridemia 12/11/2013  . Stiffness of joint, not elsewhere classified, ankle and foot 11/12/2013  . Lack of coordination 11/03/2013  . Pernicious anemia 09/12/2013  . Elevated transaminase level 09/12/2013  . Ataxia 09/09/2013  . Difficulty in walking(719.7) 09/09/2013  . Weakness of both legs 09/09/2013  . Generalized weakness 02/11/2013  . Tobacco abuse 01/14/2013  . Pedal edema 01/13/2013  . Chronic pain syndrome 12/30/2012  . Reactive airway disease 12/30/2012    Vangie Bicker, OTR/L [phone]  05/27/2015, 3:40 PM  [place] [address] [place], [place], [address] Phone: [phone]   Fax:  [phone]

## 2015-05-27 NOTE — Therapy (Signed)
[place], [place], [address] Phone: [phone]   Fax:  [phone]  Physical Therapy Treatment  Patient Details  Name: [name] MRN: [id number] Date of Birth: August 23, 1966 Referring Provider:  Kathyrn Drown, MD  Encounter Date: 05/27/2015      PT End of Session - 05/27/15 1745    Visit Number 25   Number of Visits 32   Date for PT Re-Evaluation 06/22/15   Authorization Type Medicare    Authorization Time Period 04/06/15-06/06/15; G-code done 24th visit    Authorization - Visit Number 25   Authorization - Number of Visits 34   PT Start Time 1655   PT Stop Time 1428   PT Time Calculation (min) 43 min   Equipment Utilized During Treatment Gait belt   Activity Tolerance Patient tolerated treatment well   Behavior During Therapy [facility] for tasks assessed/performed      Past Medical History  Diagnosis Date  . Anxiety   . Depression   . COPD (chronic obstructive pulmonary disease)   . MS (multiple sclerosis)   . Fibromyalgia   . Impaired fasting glucose   . 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 Eating disorder   . Chronic low back pain   . PONV (postoperative nausea and vomiting)   . Chronic bronchitis     "yearly" (02/11/2013)  . Borderline diabetes   . GERD (gastroesophageal reflux disease)   . Migraine     "I use to" (02/11/2013)  . Arthritis     "hands & feet" (02/11/2013)  . Peripheral neuropathy     [name] 02/11/2013  . B12 deficiency anemia     [name] 02/11/2013  . Chronic pain syndrome     [name] 02/11/2013  . UTI (lower urinary tract infection) 02/11/2013    [name] 02/11/2013  . Chronic edema     BLE/notes 02/11/2013  . Ataxia   . Peripheral neuropathy   . Muscle weakness of lower extremity     bilateral  . Hypertension     Past Surgical History  Procedure Laterality Date  . Abdominal hysterectomy  ~ 1987; ~ 2004    "woodward; ferguson" (02/11/2013)  . Cesarean  section  3748; 1984; 1986  . Anterior cervical decomp/discectomy fusion  2009; 2011  . Tonsillectomy  1990's?  . Cataract extraction w/phaco  06/20/2011    Procedure: CATARACT EXTRACTION PHACO AND INTRAOCULAR LENS PLACEMENT (IOC);  Surgeon: [name] T. Gershon Crane;  Location: [facility];  Service: Ophthalmology;  Laterality: Left;  CDE 1.81  . Cataract extraction w/phaco  07/04/2011    Procedure: CATARACT EXTRACTION PHACO AND INTRAOCULAR LENS PLACEMENT (IOC);  Surgeon: [name] T. Gershon Crane;  Location: [facility];  Service: Ophthalmology;  Laterality: Right;  CDE: 1.76  . Yag laser application Right 2/70/7867    Procedure: YAG LASER APPLICATION;  Surgeon: [name] T. Gershon Crane, MD;  Location: [facility];  Service: Ophthalmology;  Laterality: Right;  . Yag laser application Left 5/44/9201    Procedure: YAG LASER APPLICATION;  Surgeon: [name] T. Gershon Crane, MD;  Location: [facility];  Service: Ophthalmology;  Laterality: Left;  . Appendectomy    . Laparotomy N/A 09/27/2014    Procedure: Exploratory Laparotomy, Biopsy of Perforated Gastric Ulcer, Closure with omental patch;  Surgeon: [name], MD;  Location: [place];  Service: General;  Laterality: N/A;  . Embolectomy Left 09/27/2014    Procedure: Left Brachial, Radial,Ulnar Embolectomy with patch angioplasty left brachial, radial and ulnar artery;  Surgeon:  [name], MD;  Location: [facility] OR;  Service: Vascular;  Laterality: Left;  . Embolectomy Left 09/27/2014    Procedure: Left Radial, Brachial, and Ulnar Thrombectomy; Left Brachial to Radial Bypass Graft using Greater Saphenous vein graft from Left Thigh; Left Saphenous Vein Harvest; Intraoperative Arteriogram; Intra-arterial administration of TPA;  Surgeon: [name], MD;  Location: [facility] OR;  Service: Vascular;  Laterality: Left;  . Tracheostomy N/A december 2015    There were no vitals filed for this visit.  Visit Diagnosis:  Lack of coordination  Proximal muscle weakness  Ataxia  Impaired functional mobility and  activity tolerance  Abnormality of gait      Subjective Assessment - 05/27/15 1551    Subjective Pt reports that her RLE has felt weak and hard to move for the past couple of days.   Currently in Pain? Yes   Pain Score 4    Pain Location --  hands, feet, and legs                         [facility] Adult PT Treatment/Exercise - 05/27/15 1345    Ambulation/Gait   Ambulation/Gait Yes   Ambulation/Gait Assistance 4: Min guard   Ambulation Distance (Feet) 115 Feet  x2   Knee/Hip Exercises: Stretches   Piriformis Stretch Both;60 seconds   Piriformis Stretch Limitations supine   Gastroc Stretch Both;3 reps;30 seconds   Other Knee/Hip Stretches hip adductor stretch 3x30"   Knee/Hip Exercises: Standing   Heel Raises 15 reps   Forward Step Up 10 reps;Step Height: 4"   Other Standing Knee Exercises sidestepping x 3 RT in // bars                  PT Short Term Goals - 05/25/15 1422    PT SHORT TERM GOAL #1   Title Patient will demonstrate a grade of at least 3+/5 in proximal musculature and core in order to promote functional stability and safe mobility skills    Time 6   Period Weeks   Status On-going   PT SHORT TERM GOAL #2   Title Patient will be able to participate in standing tasks with Min(guard) from therapist  for at least 7 minutes at a time    Baseline 8/16- standing is still difficult, not using weights on limbs anymroe    Time 6   Period Weeks   Status Revised   PT SHORT TERM GOAL #3   Title Patient to experience no more than 4/10 at worst on a daily basis at rest and during mobility    Baseline 8/16- pain is worse later in day    Time 6   Period Weeks   Status On-going   PT SHORT TERM GOAL #4   Title Patient will demonstrate the ability to ambulate at least 59f x6 in body weight support system with weighted extremities and minimal fatigue, good gait mechanics throughout    Baseline 8/16- not using BW support system a lot during skilled PT  sessions    Time 6   Period Weeks   Status Deferred   PT SHORT TERM GOAL #5   Title Patient will be able to perform functional transfers, including sit<->stand and stand<->pivot from wheelchair to bed with close supervision and no loss of balance    Baseline 8/16- still needs MIn(A) but getting easier    Time 6   Period Weeks   Status On-going   PT SHORT TERM GOAL #  6   Title Patient will demonstrate improved posture while seated in wheelchair and will be able to state and demonstrate the importance of performing appropriate pressure relieving techniques consistently    Baseline 8/15- patient reports this is all going very well    Time 6   Period Weeks   Status Achieved           PT Long Term Goals - 05/25/15 1426    PT LONG TERM GOAL #1   Title Patient will be independent in safely performed advanced HEP with assistance from caregivers as needed, updated PRN    Time 12   Period Weeks   Status Achieved   PT LONG TERM GOAL #2   Title Patient will demonstrate at least 4/5 to 4+/5 muscle strength in all proximal muscles, bilateral lower extremities, and core    Time 12   Period Weeks   Status On-going   PT LONG TERM GOAL #3   Title Patient will demonstrate bilateral ankle DF of at least 10 degrees in order to improve overall gait mechanics and functional mobility skills    Time 12   Period Weeks   Status On-going   PT LONG TERM GOAL #4   Title Patient will demonstrate the ability to ambulate at least 161f with LRAD, no more than distant supervision, no loss of balance, and minimal fatigue throughout    Baseline 8/16- still using rolling walker, still somewhat unsteady    Time 12   Period Weeks   Status Partially Met   PT LONG TERM GOAL #5   Title Patient will be independent in performing functional transfers, including all bed mobility skills and sit<->stand and stand pivot transfers with Mod(I) and LRAD    Time 12   Period Weeks   Status On-going   PT LONG TERM GOAL #6    Title Patient will demonstrate the ability to stand at least 15 minutes before fatiguing and with good posture and minimal loss of balance    Time 12   Period Weeks   Status On-going   PT LONG TERM GOAL #7   Title Patient will regularly perform weight bearing activities at home with assistance from caregivers as needed in order to protect skin and reduce detrimental effects of being sedentary    Baseline 8/16- patient reports this is going well at home    Time 12   Period Weeks   Status Achieved               Plan - 05/27/15 1745    Clinical Impression Statement Pt experienced 2 episode of LOB today that required mod A from PT to correct. LOB occured with standing strengthening exercises, and pt reported that she felt that her legs were more fatigued today. During gait training, pt was able to walk 115' before needing rest break, and during second bout of ambulation, demonstrated decreased foot clearance on L. Pt will benefit from continued gait and balance training to reduce risk of falls.    PT Next Visit Plan Continue with gait training, focus on balance training.         Problem List Patient Active Problem List   Diagnosis Date Noted  . Primary hypercoagulable state 03/19/2015  . Chronic respiratory failure 03/19/2015  . Insomnia 02/02/2015  . MRSA pneumonia 10/11/2014  . Abdominal abscess   . Candidemia   . Screen for STD (sexually transmitted disease)   . 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 Perforated gastric ulcer 09/27/2014  .  Prediabetes 07/23/2014  . Unspecified hereditary and idiopathic peripheral neuropathy 04/15/2014  . CVA (cerebral infarction) 04/02/2014  . Hypertriglyceridemia 12/11/2013  . Stiffness of joint, not elsewhere classified, ankle and foot 11/12/2013  . Lack of coordination 11/03/2013  . Pernicious anemia 09/12/2013  . Elevated transaminase level 09/12/2013  . Ataxia 09/09/2013  . Difficulty in walking(719.7) 09/09/2013  . Weakness  of both legs 09/09/2013  . Generalized weakness 02/11/2013  . Tobacco abuse 01/14/2013  . Pedal edema 01/13/2013  . Chronic pain syndrome 12/30/2012  . Reactive airway disease 12/30/2012    Hilma Favors, PT, DPT [phone] 05/27/2015, 5:50 PM  [place] [address] [place], [place], [address] Phone: [phone]   Fax:  [phone]

## 2015-05-31 NOTE — Telephone Encounter (Signed)
[facility] 05/31/15

## 2015-05-31 NOTE — Telephone Encounter (Signed)
Spoke with patient and informed her per Dr.[name]-Please inform the patient her sleep study was negative. No sleep apnea. Patient and should be instructed to keep all regular follow-ups she should have a follow-up visit this fall. Patient verbalized understanding and stated that she has already scheduled an future appointment.

## 2015-06-01 ENCOUNTER — Ambulatory Visit (HOSPITAL_COMMUNITY): Payer: BLUE CROSS/BLUE SHIELD | Admitting: Occupational Therapy

## 2015-06-01 ENCOUNTER — Ambulatory Visit (HOSPITAL_COMMUNITY): Payer: BLUE CROSS/BLUE SHIELD

## 2015-06-01 DIAGNOSIS — R279 Unspecified lack of coordination: Secondary | ICD-10-CM

## 2015-06-01 DIAGNOSIS — R29898 Other symptoms and signs involving the musculoskeletal system: Secondary | ICD-10-CM

## 2015-06-01 DIAGNOSIS — R269 Unspecified abnormalities of gait and mobility: Secondary | ICD-10-CM

## 2015-06-01 DIAGNOSIS — Z7409 Other reduced mobility: Secondary | ICD-10-CM

## 2015-06-01 DIAGNOSIS — M79642 Pain in left hand: Secondary | ICD-10-CM

## 2015-06-01 DIAGNOSIS — M6281 Muscle weakness (generalized): Secondary | ICD-10-CM

## 2015-06-01 DIAGNOSIS — M6289 Other specified disorders of muscle: Secondary | ICD-10-CM | POA: Diagnosis not present

## 2015-06-01 DIAGNOSIS — R270 Ataxia, unspecified: Secondary | ICD-10-CM

## 2015-06-01 DIAGNOSIS — M25642 Stiffness of left hand, not elsewhere classified: Secondary | ICD-10-CM | POA: Diagnosis not present

## 2015-06-01 DIAGNOSIS — R471 Dysarthria and anarthria: Secondary | ICD-10-CM

## 2015-06-01 DIAGNOSIS — R293 Abnormal posture: Secondary | ICD-10-CM

## 2015-06-01 NOTE — Therapy (Signed)
[place], [place], [address] Phone: [phone]   Fax:  [phone]  Occupational Therapy Treatment  Patient Details  Name: [name] MRN: [id number] Date of Birth: [birth year] Referring Provider:  Kathyrn Drown, MD  Encounter Date: 06/01/2015      OT End of Session - 06/01/15 1533    Visit Number 19   Number of Visits 24   Date for OT Re-Evaluation 07/10/15  mini reassess on 06/09/15   Authorization Type Medicare A   Authorization Time Period before 20th visit   Authorization - Visit Number 19   Authorization - Number of Visits 20   OT Start Time 1435   OT Stop Time 1515   OT Time Calculation (min) 40 min   Activity Tolerance Patient tolerated treatment well   Behavior During Therapy [facility] for tasks assessed/performed      Past Medical History  Diagnosis Date  . Anxiety   . Depression   . COPD (chronic obstructive pulmonary disease)   . MS (multiple sclerosis)   . Fibromyalgia   . Impaired fasting glucose   . 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 Eating disorder   . Chronic low back pain   . PONV (postoperative nausea and vomiting)   . Chronic bronchitis     "yearly" (02/11/2013)  . Borderline diabetes   . GERD (gastroesophageal reflux disease)   . Migraine     "I use to" (02/11/2013)  . Arthritis     "hands & feet" (02/11/2013)  . Peripheral neuropathy     [name] 02/11/2013  . B12 deficiency anemia     [name] 02/11/2013  . Chronic pain syndrome     [name] 02/11/2013  . UTI (lower urinary tract infection) 02/11/2013    [name] 02/11/2013  . Chronic edema     BLE/notes 02/11/2013  . Ataxia   . Peripheral neuropathy   . Muscle weakness of lower extremity     bilateral  . Hypertension     Past Surgical History  Procedure Laterality Date  . Abdominal hysterectomy  ~ 1987; ~ 2004    "woodward; ferguson" (02/11/2013)  . Cesarean section  3428; 1984; 1986  . Anterior  cervical decomp/discectomy fusion  2009; 2011  . Tonsillectomy  1990's?  . Cataract extraction w/phaco  06/20/2011    Procedure: CATARACT EXTRACTION PHACO AND INTRAOCULAR LENS PLACEMENT (IOC);  Surgeon: [name] T. Gershon Crane;  Location: [facility];  Service: Ophthalmology;  Laterality: Left;  CDE 1.81  . Cataract extraction w/phaco  07/04/2011    Procedure: CATARACT EXTRACTION PHACO AND INTRAOCULAR LENS PLACEMENT (IOC);  Surgeon: [name] T. Gershon Crane;  Location: [facility];  Service: Ophthalmology;  Laterality: Right;  CDE: 1.76  . Yag laser application Right 7/68/1157    Procedure: YAG LASER APPLICATION;  Surgeon: [name] T. Gershon Crane, MD;  Location: [facility];  Service: Ophthalmology;  Laterality: Right;  . Yag laser application Left 2/62/0355    Procedure: YAG LASER APPLICATION;  Surgeon: [name] T. Gershon Crane, MD;  Location: [facility];  Service: Ophthalmology;  Laterality: Left;  . Appendectomy    . Laparotomy N/A 09/27/2014    Procedure: Exploratory Laparotomy, Biopsy of Perforated Gastric Ulcer, Closure with omental patch;  Surgeon: [name], MD;  Location: [place];  Service: General;  Laterality: N/A;  . Embolectomy Left 09/27/2014    Procedure: Left Brachial, Radial,Ulnar Embolectomy with patch angioplasty left brachial, radial and ulnar artery;  Surgeon: [name], MD;  [facility];  Service: Vascular;  Laterality: Left;  . Embolectomy Left 09/27/2014    Procedure: Left Radial, Brachial, and Ulnar Thrombectomy; Left Brachial to Radial Bypass Graft using Greater Saphenous vein graft from Left Thigh; Left Saphenous Vein Harvest; Intraoperative Arteriogram; Intra-arterial administration of TPA;  Surgeon: [name], MD;  [facility]: [facility];  Service: Vascular;  Laterality: Left;  . Tracheostomy N/A december 2015    There were no vitals filed for this visit.  Visit Diagnosis:  Lack of coordination  Hand muscle weakness  Hand joint pain, left  Stiffness of hand joint, left                     OT Treatments/Exercises (OP) - 06/01/15 1455    Exercises   Exercises Hand;Wrist   Fine Motor Coordination   Fine Motor Coordination Large Pegboard;Grooved pegs   Large Pegboard Pt completed designated pattern using large pegs in blue pegboard positioned on stand-up easel. Pt had min difficulty this session and completed in a timely manner. Pt used two point pinch with thumb and index finger to insert pegs. Pt required min verbal cuing to use two point pinch instead of lateral.    Grooved pegs Pt completed grooved pegboard task using two point pinch to grasp and place keyed pegs. Pt had mod difficulty with task and completed with extended time. Pt used right hand to assist in positioning pegs in left digits.                   OT Short Term Goals - 05/25/15 1649    OT SHORT TERM GOAL #1   Title Patient will be educated and independent with HEP.   Status On-going   OT SHORT TERM GOAL #2   Title Patient will increase AROM of left hand by 5 degrees to increase ability to use left hand during daily tasks.    OT SHORT TERM GOAL #3   Title Therapist will fabricate finger extension splint to defer the chance of progressing spaticity.    OT SHORT TERM GOAL #4   Title Patient will increase coordination in Bil hands by decreasing completion time of  9 hole peg test by 10 seconds.    OT SHORT TERM GOAL #5   Title Patient will increase grip strength by 5# and pinch strength by 3#  of left hand to increase ability to use left hand as an active assist during daily tasks.            OT Long Term Goals - 05/25/15 1649    OT LONG TERM GOAL #1   Title Patient will return to highest level of independence with all daily activities.    Status On-going   OT LONG TERM GOAL #2   Title Patient will increase AROM of left hand by 10 degrees.   Status On-going   OT LONG TERM GOAL #3   Title Patient will increase PROM of left hand to [facility] to decrease the progression of spaticity   Status  Partially Met   OT LONG TERM GOAL #4   Title Patient will increase grip strength by 10# and pinch strength by 5# in LUE.   Status On-going   OT LONG TERM GOAL #5   Title Patient will increase Bil coordination by decreasing completion time of 9 hole peg test by 30 seconds.    Status Partially Met               Plan -  06/01/15 1533    Clinical Impression Statement A: Completed large pegboard task focusing on two point pinch, grooved pegboard task focusing on fine motor coordination and in-hand manipulation. Unable to complete cooking task due to kitchen equipment difficulties.    Plan P: Complete no bake cooking task (jello, pudding mix, etc) focusing on grip and pinch required to open containers and prepare food.         Problem List Patient Active Problem List   Diagnosis Date Noted  . Primary hypercoagulable state 03/19/2015  . Chronic respiratory failure 03/19/2015  . Insomnia 02/02/2015  . MRSA pneumonia 10/11/2014  . Abdominal abscess   . Candidemia   . Screen for STD (sexually transmitted disease)   . 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 Perforated gastric ulcer 09/27/2014  . Prediabetes 07/23/2014  . Unspecified hereditary and idiopathic peripheral neuropathy 04/15/2014  . CVA (cerebral infarction) 04/02/2014  . Hypertriglyceridemia 12/11/2013  . Stiffness of joint, not elsewhere classified, ankle and foot 11/12/2013  . Lack of coordination 11/03/2013  . Pernicious anemia 09/12/2013  . Elevated transaminase level 09/12/2013  . Ataxia 09/09/2013  . Difficulty in walking(719.7) 09/09/2013  . Weakness of both legs 09/09/2013  . Generalized weakness 02/11/2013  . Tobacco abuse 01/14/2013  . Pedal edema 01/13/2013  . Chronic pain syndrome 12/30/2012  . Reactive airway disease 12/30/2012    Guadelupe Sabin, OTR/L  [phone]  06/01/2015, 3:37 PM  [place], [place], [address] Phone:  [phone]   Fax:  [phone]

## 2015-06-01 NOTE — Therapy (Signed)
[facility] [address] [place], [place], [address] Phone: [phone]   Fax:  [phone]  Physical Therapy Treatment  Patient Details  Name: [name] MRN: [id number] Date of Birth: Mar 30, 1966 Referring Provider:  Kathyrn Drown, MD  Encounter Date: 06/01/2015      PT End of Session - 06/01/15 1442    Visit Number 26   Number of Visits 32   Date for PT Re-Evaluation 06/22/15   Authorization Type Medicare    Authorization Time Period 04/06/15-06/06/15; G-code done 24th visit    Authorization - Visit Number 26   Authorization - Number of Visits 34   PT Start Time 4081   PT Stop Time 1435   PT Time Calculation (min) 42 min   Equipment Utilized During Treatment Gait belt   Activity Tolerance Patient tolerated treatment well   Behavior During Therapy [facility] for tasks assessed/performed      Past Medical History  Diagnosis Date  . Anxiety   . Depression   . COPD (chronic obstructive pulmonary disease)   . MS (multiple sclerosis)   . Fibromyalgia   . Impaired fasting glucose   . 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 Eating disorder   . Chronic low back pain   . PONV (postoperative nausea and vomiting)   . Chronic bronchitis     "yearly" (02/11/2013)  . Borderline diabetes   . GERD (gastroesophageal reflux disease)   . Migraine     "I use to" (02/11/2013)  . Arthritis     "hands & feet" (02/11/2013)  . Peripheral neuropathy     [name] 02/11/2013  . B12 deficiency anemia     [name] 02/11/2013  . Chronic pain syndrome     [name] 02/11/2013  . UTI (lower urinary tract infection) 02/11/2013    [name] 02/11/2013  . Chronic edema     BLE/notes 02/11/2013  . Ataxia   . Peripheral neuropathy   . Muscle weakness of lower extremity     bilateral  . Hypertension     Past Surgical History  Procedure Laterality Date  . Abdominal hysterectomy  ~ 1987; ~ 2004    "woodward; ferguson" (02/11/2013)  . Cesarean  section  4481; 1984; 1986  . Anterior cervical decomp/discectomy fusion  2009; 2011  . Tonsillectomy  1990's?  . Cataract extraction w/phaco  06/20/2011    Procedure: CATARACT EXTRACTION PHACO AND INTRAOCULAR LENS PLACEMENT (IOC);  Surgeon: [name] T. Gershon Crane;  [facility];  Service: Ophthalmology;  Laterality: Left;  CDE 1.81  . Cataract extraction w/phaco  07/04/2011    Procedure: CATARACT EXTRACTION PHACO AND INTRAOCULAR LENS PLACEMENT (IOC);  Surgeon: [name] T. Gershon Crane;  [facility]: [facility];  Service: Ophthalmology;  Laterality: Right;  CDE: 1.76  . Yag laser application Right 8/56/3149    Procedure: YAG LASER APPLICATION;  Surgeon: [name] T. Gershon Crane, MD;  Location: [facility];  Service: Ophthalmology;  Laterality: Right;  . Yag laser application Left 04/09/6377    Procedure: YAG LASER APPLICATION;  Surgeon: [name] T. Gershon Crane, MD;  Location: [facility];  Service: Ophthalmology;  Laterality: Left;  . Appendectomy    . Laparotomy N/A 09/27/2014    Procedure: Exploratory Laparotomy, Biopsy of Perforated Gastric Ulcer, Closure with omental patch;  Surgeon: [name], MD;  Location: [place];  Service: General;  Laterality: N/A;  . Embolectomy Left 09/27/2014    Procedure: Left Brachial, Radial,Ulnar Embolectomy with patch angioplasty left brachial, radial and ulnar artery;  Surgeon:  [name], MD;  [facility] OR;  Service: Vascular;  Laterality: Left;  . Embolectomy Left 09/27/2014    Procedure: Left Radial, Brachial, and Ulnar Thrombectomy; Left Brachial to Radial Bypass Graft using Greater Saphenous vein graft from Left Thigh; Left Saphenous Vein Harvest; Intraoperative Arteriogram; Intra-arterial administration of TPA;  Surgeon: [name], MD;  Location: [facility] OR;  Service: Vascular;  Laterality: Left;  . Tracheostomy N/A december 2015    There were no vitals filed for this visit.  Visit Diagnosis:  Lack of coordination  Proximal muscle weakness  Ataxia  Impaired functional mobility and  activity tolerance  Abnormality of gait  Weakness of both legs  Poor posture  Ataxic dysarthria      Subjective Assessment - 06/01/15 1436    Subjective Pt stated her Rt LE won't listen very well, increased difficulty moving leg.  Current pain scale 4/10 hands and feet Rt>Lt   Currently in Pain? Yes   Pain Score 4    Pain Location --  hands and feet Rt>Lt   Pain Orientation Right;Left   Pain Descriptors / Indicators Sore;Aching             [facility] Adult PT Treatment/Exercise - 06/01/15 0001    Ambulation/Gait   Ambulation/Gait Yes   Ambulation/Gait Assistance 4: Min guard   Ambulation Distance (Feet) 120 Feet   Assistive device Rolling walker   Knee/Hip Exercises: Stretches   Piriformis Stretch Both;3 reps;60 seconds   Gastroc Stretch Both;3 reps;30 seconds   Gastroc Stretch Limitations passive in supine   Knee/Hip Exercises: Standing   Heel Raises 15 reps   Functional Squat 10 reps   Other Standing Knee Exercises sidestepping x 3 RT in // bars 1 set with RTB   Other Standing Knee Exercises standing 1x 60" WBOS no HHA, standing NBOS with no HHA 1x60"   Knee/Hip Exercises: Seated   Other Seated Knee/Hip Exercises Sitting on dynadisc with cone rotation BUE             PT Short Term Goals - 05/25/15 1422    PT SHORT TERM GOAL #1   Title Patient will demonstrate a grade of at least 3+/5 in proximal musculature and core in order to promote functional stability and safe mobility skills    Time 6   Period Weeks   Status On-going   PT SHORT TERM GOAL #2   Title Patient will be able to participate in standing tasks with Min(guard) from therapist  for at least 7 minutes at a time    Baseline 8/16- standing is still difficult, not using weights on limbs anymroe    Time 6   Period Weeks   Status Revised   PT SHORT TERM GOAL #3   Title Patient to experience no more than 4/10 at worst on a daily basis at rest and during mobility    Baseline 8/16- pain is worse later  in day    Time 6   Period Weeks   Status On-going   PT SHORT TERM GOAL #4   Title Patient will demonstrate the ability to ambulate at least 45f x6 in body weight support system with weighted extremities and minimal fatigue, good gait mechanics throughout    Baseline 8/16- not using BW support system a lot during skilled PT sessions    Time 6   Period Weeks   Status Deferred   PT SHORT TERM GOAL #5   Title Patient will be able to perform functional transfers, including  sit<->stand and stand<->pivot from wheelchair to bed with close supervision and no loss of balance    Baseline 8/16- still needs MIn(A) but getting easier    Time 6   Period Weeks   Status On-going   PT SHORT TERM GOAL #6   Title Patient will demonstrate improved posture while seated in wheelchair and will be able to state and demonstrate the importance of performing appropriate pressure relieving techniques consistently    Baseline 8/15- patient reports this is all going very well    Time 6   Period Weeks   Status Achieved           PT Long Term Goals - 05/25/15 1426    PT LONG TERM GOAL #1   Title Patient will be independent in safely performed advanced HEP with assistance from caregivers as needed, updated PRN    Time 12   Period Weeks   Status Achieved   PT LONG TERM GOAL #2   Title Patient will demonstrate at least 4/5 to 4+/5 muscle strength in all proximal muscles, bilateral lower extremities, and core    Time 12   Period Weeks   Status On-going   PT LONG TERM GOAL #3   Title Patient will demonstrate bilateral ankle DF of at least 10 degrees in order to improve overall gait mechanics and functional mobility skills    Time 12   Period Weeks   Status On-going   PT LONG TERM GOAL #4   Title Patient will demonstrate the ability to ambulate at least 143f with LRAD, no more than distant supervision, no loss of balance, and minimal fatigue throughout    Baseline 8/16- still using rolling walker, still  somewhat unsteady    Time 12   Period Weeks   Status Partially Met   PT LONG TERM GOAL #5   Title Patient will be independent in performing functional transfers, including all bed mobility skills and sit<->stand and stand pivot transfers with Mod(I) and LRAD    Time 12   Period Weeks   Status On-going   PT LONG TERM GOAL #6   Title Patient will demonstrate the ability to stand at least 15 minutes before fatiguing and with good posture and minimal loss of balance    Time 12   Period Weeks   Status On-going   PT LONG TERM GOAL #7   Title Patient will regularly perform weight bearing activities at home with assistance from caregivers as needed in order to protect skin and reduce detrimental effects of being sedentary    Baseline 8/16- patient reports this is going well at home    Time 12   Period Weeks   Status Achieved               Plan - 06/01/15 1442    Clinical Impression Statement Continued passive stretches to improve hip and ankle mobility with gait.  Noted decreased foot clearance BLE during gait training this session, no LOB episodes noted, able to complete gait training with min assistnace today.  Added seated cone rotation with cones on dynadisc to improve core strengthening and seated balance training.  Standing balance activities complete in static position with no HHA with min guard.  No reports of increased pain through session.     PT Next Visit Plan Continue with gait training, focus on balance training.         Problem List Patient Active Problem List   Diagnosis Date Noted  . Primary hypercoagulable state  03/19/2015  . Chronic respiratory failure 03/19/2015  . Insomnia 02/02/2015  . MRSA pneumonia 10/11/2014  . Abdominal abscess   . Candidemia   . Screen for STD (sexually transmitted disease)   . 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 Perforated gastric ulcer 09/27/2014  . Prediabetes 07/23/2014  . Unspecified hereditary and idiopathic peripheral  neuropathy 04/15/2014  . CVA (cerebral infarction) 04/02/2014  . Hypertriglyceridemia 12/11/2013  . Stiffness of joint, not elsewhere classified, ankle and foot 11/12/2013  . Lack of coordination 11/03/2013  . Pernicious anemia 09/12/2013  . Elevated transaminase level 09/12/2013  . Ataxia 09/09/2013  . Difficulty in walking(719.7) 09/09/2013  . Weakness of both legs 09/09/2013  . Generalized weakness 02/11/2013  . Tobacco abuse 01/14/2013  . Pedal edema 01/13/2013  . Chronic pain syndrome 12/30/2012  . Reactive airway disease 12/30/2012   Ihor Austin, [place]; [place]  [name] 06/01/2015, 2:50 PM  [facility] [address] [place], [place], [address] Phone: [phone]   Fax:  [phone]

## 2015-06-03 ENCOUNTER — Ambulatory Visit (HOSPITAL_COMMUNITY): Payer: BLUE CROSS/BLUE SHIELD | Admitting: Physical Therapy

## 2015-06-03 ENCOUNTER — Ambulatory Visit (HOSPITAL_COMMUNITY): Payer: BLUE CROSS/BLUE SHIELD

## 2015-06-03 ENCOUNTER — Encounter (HOSPITAL_COMMUNITY): Payer: Self-pay

## 2015-06-03 DIAGNOSIS — R270 Ataxia, unspecified: Secondary | ICD-10-CM

## 2015-06-03 DIAGNOSIS — R293 Abnormal posture: Secondary | ICD-10-CM

## 2015-06-03 DIAGNOSIS — R279 Unspecified lack of coordination: Secondary | ICD-10-CM

## 2015-06-03 DIAGNOSIS — M6281 Muscle weakness (generalized): Secondary | ICD-10-CM

## 2015-06-03 DIAGNOSIS — M25642 Stiffness of left hand, not elsewhere classified: Secondary | ICD-10-CM | POA: Diagnosis not present

## 2015-06-03 DIAGNOSIS — Z7409 Other reduced mobility: Secondary | ICD-10-CM

## 2015-06-03 DIAGNOSIS — R29898 Other symptoms and signs involving the musculoskeletal system: Secondary | ICD-10-CM | POA: Diagnosis not present

## 2015-06-03 DIAGNOSIS — G709 Myoneural disorder, unspecified: Secondary | ICD-10-CM

## 2015-06-03 DIAGNOSIS — R490 Dysphonia: Secondary | ICD-10-CM

## 2015-06-03 DIAGNOSIS — M6289 Other specified disorders of muscle: Secondary | ICD-10-CM | POA: Diagnosis not present

## 2015-06-03 DIAGNOSIS — R269 Unspecified abnormalities of gait and mobility: Secondary | ICD-10-CM

## 2015-06-03 DIAGNOSIS — M79642 Pain in left hand: Secondary | ICD-10-CM | POA: Diagnosis not present

## 2015-06-03 NOTE — Therapy (Addendum)
[place], [place], [address] Phone: [phone]   Fax:  [phone]  Occupational Therapy Treatment  Patient Details  Name: [name] MRN: [id number] Date of Birth: 16-Sep-1966 Referring Provider:  Kathyrn Drown, MD  Encounter Date: 06/03/2015      OT End of Session - 06/03/15 1628    Visit Number 20   Number of Visits 24   Date for OT Re-Evaluation 07/10/15  mini reassess on 06/09/15   Authorization Type Medicare A   Authorization Time Period before 30th visit   Authorization - Visit Number 61   Authorization - Number of Visits 30   OT Start Time 1430   OT Stop Time 1515   OT Time Calculation (min) 45 min   Activity Tolerance Patient tolerated treatment well   Behavior During Therapy [facility] for tasks assessed/performed      Past Medical History  Diagnosis Date  . Anxiety   . Depression   . COPD (chronic obstructive pulmonary disease)   . MS (multiple sclerosis)   . Fibromyalgia   . Impaired fasting glucose   . 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 Eating disorder   . Chronic low back pain   . PONV (postoperative nausea and vomiting)   . Chronic bronchitis     "yearly" (02/11/2013)  . Borderline diabetes   . GERD (gastroesophageal reflux disease)   . Migraine     "I use to" (02/11/2013)  . Arthritis     "hands & feet" (02/11/2013)  . Peripheral neuropathy     [name] 02/11/2013  . B12 deficiency anemia     [name] 02/11/2013  . Chronic pain syndrome     [name] 02/11/2013  . UTI (lower urinary tract infection) 02/11/2013    [name] 02/11/2013  . Chronic edema     BLE/notes 02/11/2013  . Ataxia   . Peripheral neuropathy   . Muscle weakness of lower extremity     bilateral  . Hypertension     Past Surgical History  Procedure Laterality Date  . Abdominal hysterectomy  ~ 1987; ~ 2004    "woodward; ferguson" (02/11/2013)  . Cesarean section  8756; 1984; 1986  . Anterior  cervical decomp/discectomy fusion  2009; 2011  . Tonsillectomy  1990's?  . Cataract extraction w/phaco  06/20/2011    Procedure: CATARACT EXTRACTION PHACO AND INTRAOCULAR LENS PLACEMENT (IOC);  Surgeon: [name] T. Gershon Crane;  Location: [facility];  Service: Ophthalmology;  Laterality: Left;  CDE 1.81  . Cataract extraction w/phaco  07/04/2011    Procedure: CATARACT EXTRACTION PHACO AND INTRAOCULAR LENS PLACEMENT (IOC);  Surgeon: [name] T. Gershon Crane;  Location: [facility];  Service: Ophthalmology;  Laterality: Right;  CDE: 1.76  . Yag laser application Right 4/33/2951    Procedure: YAG LASER APPLICATION;  Surgeon: [name] T. Gershon Crane, MD;  Location: [facility];  Service: Ophthalmology;  Laterality: Right;  . Yag laser application Left 8/84/1660    Procedure: YAG LASER APPLICATION;  Surgeon: [name] T. Gershon Crane, MD;  Location: [facility];  Service: Ophthalmology;  Laterality: Left;  . Appendectomy    . Laparotomy N/A 09/27/2014    Procedure: Exploratory Laparotomy, Biopsy of Perforated Gastric Ulcer, Closure with omental patch;  Surgeon: [name], MD;  Location: [facility];  Service: General;  Laterality: N/A;  . Embolectomy Left 09/27/2014    Procedure: Left Brachial, Radial,Ulnar Embolectomy with patch angioplasty left brachial, radial and ulnar artery;  Surgeon: [name], MD;  Location:  [facility];  Service: Vascular;  Laterality: Left;  . Embolectomy Left 09/27/2014    Procedure: Left Radial, Brachial, and Ulnar Thrombectomy; Left Brachial to Radial Bypass Graft using Greater Saphenous vein graft from Left Thigh; Left Saphenous Vein Harvest; Intraoperative Arteriogram; Intra-arterial administration of TPA;  Surgeon: [name], MD;  Location: [facility] OR;  Service: Vascular;  Laterality: Left;  . Tracheostomy N/A december 2015    There were no vitals filed for this visit.  Visit Diagnosis:  Lack of coordination  Hand muscle weakness      Subjective Assessment - 06/03/15 1602    Subjective  S: I brought my splint the  other day but I forgot to tell the other girl to check it out.    Currently in Pain? Yes   Pain Score 4    Pain Location Back   Pain Orientation Mid   Pain Descriptors / Indicators Sore;Aching            [facility] OT Assessment - 06/03/15 0001    Assessment   Diagnosis left hand spaticity   Precautions   Precautions Fall                  OT Treatments/Exercises (OP) - 06/03/15 0001    ADLs   Cooking Patient completed simple meal prep task with Jello. Due to the stove being out of order we used the microwave. Patient needed assistance with opening the jello box and packet. Therapist started to open container and packet and patient was able to finish. Patient stood at countertop to retrieve water from faucet and to manipulate microwave. Pt then stood to pour dry jello mix into container. Pt requested to sit at this point as her back was beginning to hurt. Pt sat and used RUE to mix jello in water for approx. 5 minutes.    Exercises   Exercises Hand   Hand Exercises   Other Hand Exercises Resistive clothespin utilized this session to increase pinch strength. Pt placed and removed all yellow, red, and green pins with LUE. Pt placed and removed all blue and black pins with left hand. Increased time needed. Min-mod difficulty depending on resistance.                   OT Short Term Goals - 05/25/15 1649    OT SHORT TERM GOAL #1   Title Patient will be educated and independent with HEP.   Status On-going   OT SHORT TERM GOAL #2   Title Patient will increase AROM of left hand by 5 degrees to increase ability to use left hand during daily tasks.    OT SHORT TERM GOAL #3   Title Therapist will fabricate finger extension splint to defer the chance of progressing spaticity.    OT SHORT TERM GOAL #4   Title Patient will increase coordination in Bil hands by decreasing completion time of  9 hole peg test by 10 seconds.    OT SHORT TERM GOAL #5   Title Patient will increase  grip strength by 5# and pinch strength by 3#  of left hand to increase ability to use left hand as an active assist during daily tasks.            OT Long Term Goals - 05/25/15 1649    OT LONG TERM GOAL #1   Title Patient will return to highest level of independence with all daily activities.    Status On-going   OT LONG TERM GOAL #  2   Title Patient will increase AROM of left hand by 10 degrees.   Status On-going   OT LONG TERM GOAL #3   Title Patient will increase PROM of left hand to [facility] to decrease the progression of spaticity   Status Partially Met   OT LONG TERM GOAL #4   Title Patient will increase grip strength by 10# and pinch strength by 5# in LUE.   Status On-going   OT LONG TERM GOAL #5   Title Patient will increase Bil coordination by decreasing completion time of 9 hole peg test by 30 seconds.    Status Partially Met               Plan - June 19, 2015 1631    Clinical Impression Statement A: Pt completed meal prep task focusing on pinch and grip strength needed to open containers. Pt was not able to open packet or container independently and required therapist to start task while patient then finished.    Plan P: Attempt a cooking task (muffins or cookies) to work on grip and pinch strength needed to open containers and prep food.          G-Codes - 06-19-2015 1630    Functional Assessment Tool Used Clinical judgement   Functional Limitation Carrying, moving and handling objects   Carrying, Moving and Handling Objects Current Status [phone]) At least 40 percent but less than 60 percent impaired, limited or restricted   Carrying, Moving and Handling Objects Goal Status ([id number]) At least 20 percent but less than 40 percent impaired, limited or restricted      Problem List Patient Active Problem List   Diagnosis Date Noted  . Primary hypercoagulable state 03/19/2015  . Chronic respiratory failure 03/19/2015  . Insomnia 02/02/2015  . MRSA pneumonia 10/11/2014   . Abdominal abscess   . Candidemia   . Screen for STD (sexually transmitted disease)   . 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 Perforated gastric ulcer 09/27/2014  . Prediabetes 07/23/2014  . Unspecified hereditary and idiopathic peripheral neuropathy 04/15/2014  . CVA (cerebral infarction) 04/02/2014  . Hypertriglyceridemia 12/11/2013  . Stiffness of joint, not elsewhere classified, ankle and foot 11/12/2013  . Lack of coordination 11/03/2013  . Pernicious anemia 09/12/2013  . Elevated transaminase level 09/12/2013  . Ataxia 09/09/2013  . Difficulty in walking(719.7) 09/09/2013  . Weakness of both legs 09/09/2013  . Generalized weakness 02/11/2013  . Tobacco abuse 01/14/2013  . Pedal edema 01/13/2013  . Chronic pain syndrome 12/30/2012  . Reactive airway disease 12/30/2012   Occupational Therapy Progress Note  Dates of Reporting Period: 04/22/15 to 2015-06-19  Objective Reports of Subjective Statement: Pt reports that she continues to have difficulty opening jars and containers using Bil hands during functional tasks.  Objective Measurements:  Coordination    Left 9 Hole Peg Test 2'30"  last progress note: 2'19"   AROM   Right/Left Wrist Right   Left Wrist Extension 60 Degrees  last progress note: 55   Left Wrist Flexion 30 Degrees  last progress note: 32   Left Wrist Radial Deviation 48 Degrees  last progress note: 8   Left Wrist Ulnar Deviation 34 Degrees  last progress note: 12   Right/Left Finger Left   Left Composite Finger Extension 75%  last progress note: 75%   Strength   Left Hand Grip (lbs) 20  last progress note: 17   Left Hand Lateral Pinch 6 lbs  same at last progress note   Left  Hand 3 Point Pinch 5.5 lbs  last progress note: 5         Goal Update: No updates. No additional goals met. Patient is progressing towards therapy LTGs.  Plan: Continue to focus on increasing grip and pinch strengthening that is  required during functional daily tasks.  Reason Skilled Services are Required: Pt requires skilled OT services to increase functional performance during daily tasks using BUE and overall increasing independence and quality of life.  Ailene Ravel, OTR/L,CBIS  [phone]  06/03/2015, 4:40 PM  [facility] [address] [place], [place], [address] Phone: [phone]   Fax:  [phone]

## 2015-06-03 NOTE — Therapy (Signed)
[facility] [address] [place], [place], [address] Phone: [phone]   Fax:  [phone]  Physical Therapy Treatment  Patient Details  Name: [name] MRN: [id number] Date of Birth: 04-22-1966 Referring Provider:  Kathyrn Drown, MD  Encounter Date: 06/03/2015      PT End of Session - 06/03/15 1445    Visit Number 27   Number of Visits 32   Date for PT Re-Evaluation 06/22/15   Authorization Type Medicare    Authorization Time Period 04/06/15-06/06/15; G-code done 24th visit    Authorization - Visit Number 27   Authorization - Number of Visits 34   PT Start Time 1350   PT Stop Time 1431   PT Time Calculation (min) 41 min   Equipment Utilized During Treatment Gait belt   Activity Tolerance Patient tolerated treatment well   Behavior During Therapy [facility] for tasks assessed/performed      Past Medical History  Diagnosis Date  . Anxiety   . Depression   . COPD (chronic obstructive pulmonary disease)   . MS (multiple sclerosis)   . Fibromyalgia   . Impaired fasting glucose   . 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 Eating disorder   . Chronic low back pain   . PONV (postoperative nausea and vomiting)   . Chronic bronchitis     "yearly" (02/11/2013)  . Borderline diabetes   . GERD (gastroesophageal reflux disease)   . Migraine     "I use to" (02/11/2013)  . Arthritis     "hands & feet" (02/11/2013)  . Peripheral neuropathy     [name] 02/11/2013  . B12 deficiency anemia     [name] 02/11/2013  . Chronic pain syndrome     [name] 02/11/2013  . UTI (lower urinary tract infection) 02/11/2013    [name] 02/11/2013  . Chronic edema     BLE/notes 02/11/2013  . Ataxia   . Peripheral neuropathy   . Muscle weakness of lower extremity     bilateral  . Hypertension     Past Surgical History  Procedure Laterality Date  . Abdominal hysterectomy  ~ 1987; ~ 2004    "woodward; ferguson" (02/11/2013)  . Cesarean  section  8413; 1984; 1986  . Anterior cervical decomp/discectomy fusion  2009; 2011  . Tonsillectomy  1990's?  . Cataract extraction w/phaco  06/20/2011    Procedure: CATARACT EXTRACTION PHACO AND INTRAOCULAR LENS PLACEMENT (IOC);  Surgeon: [name] T. Gershon Crane;  Location: [facility];  Service: Ophthalmology;  Laterality: Left;  CDE 1.81  . Cataract extraction w/phaco  07/04/2011    Procedure: CATARACT EXTRACTION PHACO AND INTRAOCULAR LENS PLACEMENT (IOC);  Surgeon: [name] T. Gershon Crane;  Location: [facility];  Service: Ophthalmology;  Laterality: Right;  CDE: 1.76  . Yag laser application Right 2/44/0102    Procedure: YAG LASER APPLICATION;  Surgeon: [name] T. Gershon Crane, MD;  Location: [facility];  Service: Ophthalmology;  Laterality: Right;  . Yag laser application Left 05/03/3663    Procedure: YAG LASER APPLICATION;  Surgeon: [name] T. Gershon Crane, MD;  Location: [facility];  Service: Ophthalmology;  Laterality: Left;  . Appendectomy    . Laparotomy N/A 09/27/2014    Procedure: Exploratory Laparotomy, Biopsy of Perforated Gastric Ulcer, Closure with omental patch;  Surgeon: [name], MD;  Location: [facility];  Service: General;  Laterality: N/A;  . Embolectomy Left 09/27/2014    Procedure: Left Brachial, Radial,Ulnar Embolectomy with patch angioplasty left brachial, radial and ulnar artery;  Surgeon:  [name], MD;  Location: [facility] OR;  Service: Vascular;  Laterality: Left;  . Embolectomy Left 09/27/2014    Procedure: Left Radial, Brachial, and Ulnar Thrombectomy; Left Brachial to Radial Bypass Graft using Greater Saphenous vein graft from Left Thigh; Left Saphenous Vein Harvest; Intraoperative Arteriogram; Intra-arterial administration of TPA;  Surgeon: [name], MD;  Location: [facility] OR;  Service: Vascular;  Laterality: Left;  . Tracheostomy N/A december 2015    There were no vitals filed for this visit.  Visit Diagnosis:  Lack of coordination  Proximal muscle weakness  Ataxia  Impaired functional mobility and  activity tolerance  Abnormality of gait  Weakness of both legs  Poor posture  Neuromuscular disease  Dysphonia      Subjective Assessment - 06/03/15 1452    Subjective Pt states she has been doing her HEP.   States she is currently without pain but her Lt ankle will hurt if she's on her feet too long.    Currently in Pain? No/denies                         [facility] Adult PT Treatment/Exercise - 06/03/15 1442    Transfers   Transfers Sit to Stand   Ambulation/Gait   Ambulation/Gait Yes   Ambulation/Gait Assistance 4: Min guard   Ambulation Distance (Feet) 225 Feet   Assistive device Rolling walker   Knee/Hip Exercises: Stretches   Piriformis Stretch Both;3 reps;60 seconds   Gastroc Stretch Both;3 reps;30 seconds   Gastroc Stretch Limitations passive in supine   Knee/Hip Exercises: Standing   Other Standing Knee Exercises standing 1x 3 minutes WBOS no HHA                  PT Short Term Goals - 05/25/15 1422    PT SHORT TERM GOAL #1   Title Patient will demonstrate a grade of at least 3+/5 in proximal musculature and core in order to promote functional stability and safe mobility skills    Time 6   Period Weeks   Status On-going   PT SHORT TERM GOAL #2   Title Patient will be able to participate in standing tasks with Min(guard) from therapist  for at least 7 minutes at a time    Baseline 8/16- standing is still difficult, not using weights on limbs anymroe    Time 6   Period Weeks   Status Revised   PT SHORT TERM GOAL #3   Title Patient to experience no more than 4/10 at worst on a daily basis at rest and during mobility    Baseline 8/16- pain is worse later in day    Time 6   Period Weeks   Status On-going   PT SHORT TERM GOAL #4   Title Patient will demonstrate the ability to ambulate at least 43f x6 in body weight support system with weighted extremities and minimal fatigue, good gait mechanics throughout    Baseline 8/16- not using BW  support system a lot during skilled PT sessions    Time 6   Period Weeks   Status Deferred   PT SHORT TERM GOAL #5   Title Patient will be able to perform functional transfers, including sit<->stand and stand<->pivot from wheelchair to bed with close supervision and no loss of balance    Baseline 8/16- still needs MIn(A) but getting easier    Time 6   Period Weeks   Status On-going   PT SHORT TERM  GOAL #6   Title Patient will demonstrate improved posture while seated in wheelchair and will be able to state and demonstrate the importance of performing appropriate pressure relieving techniques consistently    Baseline 8/15- patient reports this is all going very well    Time 6   Period Weeks   Status Achieved           PT Long Term Goals - 05/25/15 1426    PT LONG TERM GOAL #1   Title Patient will be independent in safely performed advanced HEP with assistance from caregivers as needed, updated PRN    Time 12   Period Weeks   Status Achieved   PT LONG TERM GOAL #2   Title Patient will demonstrate at least 4/5 to 4+/5 muscle strength in all proximal muscles, bilateral lower extremities, and core    Time 12   Period Weeks   Status On-going   PT LONG TERM GOAL #3   Title Patient will demonstrate bilateral ankle DF of at least 10 degrees in order to improve overall gait mechanics and functional mobility skills    Time 12   Period Weeks   Status On-going   PT LONG TERM GOAL #4   Title Patient will demonstrate the ability to ambulate at least 141f with LRAD, no more than distant supervision, no loss of balance, and minimal fatigue throughout    Baseline 8/16- still using rolling walker, still somewhat unsteady    Time 12   Period Weeks   Status Partially Met   PT LONG TERM GOAL #5   Title Patient will be independent in performing functional transfers, including all bed mobility skills and sit<->stand and stand pivot transfers with Mod(I) and LRAD    Time 12   Period Weeks    Status On-going   PT LONG TERM GOAL #6   Title Patient will demonstrate the ability to stand at least 15 minutes before fatiguing and with good posture and minimal loss of balance    Time 12   Period Weeks   Status On-going   PT LONG TERM GOAL #7   Title Patient will regularly perform weight bearing activities at home with assistance from caregivers as needed in order to protect skin and reduce detrimental effects of being sedentary    Baseline 8/16- patient reports this is going well at home    Time 12   Period Weeks   Status Achieved               Plan - 06/03/15 1457    Clinical Impression Statement Began session with PROM for bilateral hips and ankles to improve mobility with gait and therex.  Worked on changing direction with ambulation with patient only requiring min assist with negotiation and stability.  Pt able to stand in parallel bars today with wide BOS for 3 minutes before needing a rest break.   Continues cues and instruction with controlled descent when returning to seated position.  Ankle remains tightest mm group in LE's.   PT Next Visit Plan Continue with gait training, focus on balance training.         Problem List Patient Active Problem List   Diagnosis Date Noted  . Primary hypercoagulable state 03/19/2015  . Chronic respiratory failure 03/19/2015  . Insomnia 02/02/2015  . MRSA pneumonia 10/11/2014  . Abdominal abscess   . Candidemia   . Screen for STD (sexually transmitted disease)   . 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 Perforated gastric ulcer  09/27/2014  . Prediabetes 07/23/2014  . Unspecified hereditary and idiopathic peripheral neuropathy 04/15/2014  . CVA (cerebral infarction) 04/02/2014  . Hypertriglyceridemia 12/11/2013  . Stiffness of joint, not elsewhere classified, ankle and foot 11/12/2013  . Lack of coordination 11/03/2013  . Pernicious anemia 09/12/2013  . Elevated transaminase level 09/12/2013  . Ataxia 09/09/2013  .  Difficulty in walking(719.7) 09/09/2013  . Weakness of both legs 09/09/2013  . Generalized weakness 02/11/2013  . Tobacco abuse 01/14/2013  . Pedal edema 01/13/2013  . Chronic pain syndrome 12/30/2012  . Reactive airway disease 12/30/2012    Teena Irani, PTA/CLT [phone]  06/03/2015, 3:01 PM  [facility] [address] [place], [place], [address] Phone: [phone]   Fax:  [phone]

## 2015-06-08 ENCOUNTER — Encounter (HOSPITAL_COMMUNITY): Payer: Self-pay | Admitting: Occupational Therapy

## 2015-06-08 ENCOUNTER — Ambulatory Visit (HOSPITAL_COMMUNITY): Payer: BLUE CROSS/BLUE SHIELD | Admitting: Occupational Therapy

## 2015-06-08 ENCOUNTER — Ambulatory Visit (HOSPITAL_COMMUNITY): Payer: BLUE CROSS/BLUE SHIELD | Admitting: Physical Therapy

## 2015-06-08 DIAGNOSIS — M25642 Stiffness of left hand, not elsewhere classified: Secondary | ICD-10-CM

## 2015-06-08 DIAGNOSIS — R269 Unspecified abnormalities of gait and mobility: Secondary | ICD-10-CM

## 2015-06-08 DIAGNOSIS — R293 Abnormal posture: Secondary | ICD-10-CM

## 2015-06-08 DIAGNOSIS — R279 Unspecified lack of coordination: Secondary | ICD-10-CM

## 2015-06-08 DIAGNOSIS — R270 Ataxia, unspecified: Secondary | ICD-10-CM

## 2015-06-08 DIAGNOSIS — R29898 Other symptoms and signs involving the musculoskeletal system: Secondary | ICD-10-CM

## 2015-06-08 DIAGNOSIS — M79642 Pain in left hand: Secondary | ICD-10-CM | POA: Diagnosis not present

## 2015-06-08 DIAGNOSIS — M6281 Muscle weakness (generalized): Secondary | ICD-10-CM

## 2015-06-08 DIAGNOSIS — Z7409 Other reduced mobility: Secondary | ICD-10-CM

## 2015-06-08 DIAGNOSIS — M6289 Other specified disorders of muscle: Secondary | ICD-10-CM | POA: Diagnosis not present

## 2015-06-08 NOTE — Therapy (Signed)
[place], [place], [address] Phone: [phone]   Fax:  [phone]  Physical Therapy Treatment  Patient Details  Name: [name] MRN: [id number] Date of Birth: 17-Jul-1966 Referring Provider:  Kathyrn Drown, MD  Encounter Date: 06/08/2015      PT End of Session - 06/08/15 1640    Visit Number 28   Number of Visits 32   Date for PT Re-Evaluation 06/22/15   Authorization Type Medicare    Authorization Time Period 04/06/15-06/06/15; G-code done 24th visit    Authorization - Visit Number 28   Authorization - Number of Visits 34   PT Start Time 1287   PT Stop Time 1430   PT Time Calculation (min) 35 min   Equipment Utilized During Treatment Gait belt   Activity Tolerance Patient tolerated treatment well   Behavior During Therapy [facility] for tasks assessed/performed      Past Medical History  Diagnosis Date  . Anxiety   . Depression   . COPD (chronic obstructive pulmonary disease)   . MS (multiple sclerosis)   . Fibromyalgia   . Impaired fasting glucose   . 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 Eating disorder   . Chronic low back pain   . PONV (postoperative nausea and vomiting)   . Chronic bronchitis     "yearly" (02/11/2013)  . Borderline diabetes   . GERD (gastroesophageal reflux disease)   . Migraine     "I use to" (02/11/2013)  . Arthritis     "hands & feet" (02/11/2013)  . Peripheral neuropathy     [name] 02/11/2013  . B12 deficiency anemia     [name] 02/11/2013  . Chronic pain syndrome     [name] 02/11/2013  . UTI (lower urinary tract infection) 02/11/2013    [name] 02/11/2013  . Chronic edema     BLE/notes 02/11/2013  . Ataxia   . Peripheral neuropathy   . Muscle weakness of lower extremity     bilateral  . Hypertension     Past Surgical History  Procedure Laterality Date  . Abdominal hysterectomy  ~ 1987; ~ 2004    "woodward; ferguson" (02/11/2013)  . Cesarean  section  8676; 1984; 1986  . Anterior cervical decomp/discectomy fusion  2009; 2011  . Tonsillectomy  1990's?  . Cataract extraction w/phaco  06/20/2011    Procedure: CATARACT EXTRACTION PHACO AND INTRAOCULAR LENS PLACEMENT (IOC);  Surgeon: [name] T. Gershon Crane;  Location: [facility];  Service: Ophthalmology;  Laterality: Left;  CDE 1.81  . Cataract extraction w/phaco  07/04/2011    Procedure: CATARACT EXTRACTION PHACO AND INTRAOCULAR LENS PLACEMENT (IOC);  Surgeon: [name] T. Gershon Crane;  Location: [facility];  Service: Ophthalmology;  Laterality: Right;  CDE: 1.76  . Yag laser application Right 04/27/9469    Procedure: YAG LASER APPLICATION;  Surgeon: [name] T. Gershon Crane, MD;  Location: [facility];  Service: Ophthalmology;  Laterality: Right;  . Yag laser application Left 9/62/8366    Procedure: YAG LASER APPLICATION;  Surgeon: [name] T. Gershon Crane, MD;  Location: [facility];  Service: Ophthalmology;  Laterality: Left;  . Appendectomy    . Laparotomy N/A 09/27/2014    Procedure: Exploratory Laparotomy, Biopsy of Perforated Gastric Ulcer, Closure with omental patch;  Surgeon: [name], MD;  Location: [place];  Service: General;  Laterality: N/A;  . Embolectomy Left 09/27/2014    Procedure: Left Brachial, Radial,Ulnar Embolectomy with patch angioplasty left brachial, radial and ulnar artery;  Surgeon:  [name], MD;  Location: [facility] OR;  Service: Vascular;  Laterality: Left;  . Embolectomy Left 09/27/2014    Procedure: Left Radial, Brachial, and Ulnar Thrombectomy; Left Brachial to Radial Bypass Graft using Greater Saphenous vein graft from Left Thigh; Left Saphenous Vein Harvest; Intraoperative Arteriogram; Intra-arterial administration of TPA;  Surgeon: [name], MD;  Location: [facility] OR;  Service: Vascular;  Laterality: Left;  . Tracheostomy N/A december 2015    There were no vitals filed for this visit.  Visit Diagnosis:  Lack of coordination  Ataxia  Proximal muscle weakness  Impaired functional mobility and  activity tolerance  Abnormality of gait  Weakness of both legs  Poor posture      Subjective Assessment - 06/08/15 1630    Subjective Patient reports that she has been doing well, having some pain in her legs and hands    Pertinent History Patient reports that she became very ill in December of last year with a major blood clot in her arm, for which she required two surgeries. Patient states she remained ill for about 3 months; she also states that before she got sick at the end of the year, she was able to get up and walk, didn't need any help for transfers either. Patient reports that her MDs suspect that she may have MS but that she doesn't agree with them.    Currently in Pain? Yes   Pain Score 4    Pain Location Leg  legs and hands   Pain Orientation Right;Left                              Balance Exercises - 06/08/15 1633    Balance Exercises: Standing   Standing Eyes Opened Narrow base of support (BOS);Foam/compliant surface;Solid surface;Other (comment)  with eyes open on foam and eyes closed on solid surface    Tandem Stance Eyes open;Foam/compliant surface;Other (comment)  x3 each side, 10-15 seconds   SLS Eyes open;Solid surface;Upper extremity support 1;5 reps  x5 reps, 10 seconds each side            PT Education - 06/08/15 1640    Education provided Yes   Education Details education regarding importance of incorporating balance activities into treatment sessions    Person(s) Educated Patient   Methods Explanation   Comprehension Verbalized understanding          PT Short Term Goals - 05/25/15 1422    PT SHORT TERM GOAL #1   Title Patient will demonstrate a grade of at least 3+/5 in proximal musculature and core in order to promote functional stability and safe mobility skills    Time 6   Period Weeks   Status On-going   PT SHORT TERM GOAL #2   Title Patient will be able to participate in standing tasks with Min(guard) from  therapist  for at least 7 minutes at a time    Baseline 8/16- standing is still difficult, not using weights on limbs anymroe    Time 6   Period Weeks   Status Revised   PT SHORT TERM GOAL #3   Title Patient to experience no more than 4/10 at worst on a daily basis at rest and during mobility    Baseline 8/16- pain is worse later in day    Time 6   Period Weeks   Status On-going   PT SHORT TERM GOAL #4   Title Patient will  demonstrate the ability to ambulate at least 62f x6 in body weight support system with weighted extremities and minimal fatigue, good gait mechanics throughout    Baseline 8/16- not using BW support system a lot during skilled PT sessions    Time 6   Period Weeks   Status Deferred   PT SHORT TERM GOAL #5   Title Patient will be able to perform functional transfers, including sit<->stand and stand<->pivot from wheelchair to bed with close supervision and no loss of balance    Baseline 8/16- still needs MIn(A) but getting easier    Time 6   Period Weeks   Status On-going   PT SHORT TERM GOAL #6   Title Patient will demonstrate improved posture while seated in wheelchair and will be able to state and demonstrate the importance of performing appropriate pressure relieving techniques consistently    Baseline 8/15- patient reports this is all going very well    Time 6   Period Weeks   Status Achieved           PT Long Term Goals - 05/25/15 1426    PT LONG TERM GOAL #1   Title Patient will be independent in safely performed advanced HEP with assistance from caregivers as needed, updated PRN    Time 12   Period Weeks   Status Achieved   PT LONG TERM GOAL #2   Title Patient will demonstrate at least 4/5 to 4+/5 muscle strength in all proximal muscles, bilateral lower extremities, and core    Time 12   Period Weeks   Status On-going   PT LONG TERM GOAL #3   Title Patient will demonstrate bilateral ankle DF of at least 10 degrees in order to improve overall  gait mechanics and functional mobility skills    Time 12   Period Weeks   Status On-going   PT LONG TERM GOAL #4   Title Patient will demonstrate the ability to ambulate at least 1071fwith LRAD, no more than distant supervision, no loss of balance, and minimal fatigue throughout    Baseline 8/16- still using rolling walker, still somewhat unsteady    Time 12   Period Weeks   Status Partially Met   PT LONG TERM GOAL #5   Title Patient will be independent in performing functional transfers, including all bed mobility skills and sit<->stand and stand pivot transfers with Mod(I) and LRAD    Time 12   Period Weeks   Status On-going   PT LONG TERM GOAL #6   Title Patient will demonstrate the ability to stand at least 15 minutes before fatiguing and with good posture and minimal loss of balance    Time 12   Period Weeks   Status On-going   PT LONG TERM GOAL #7   Title Patient will regularly perform weight bearing activities at home with assistance from caregivers as needed in order to protect skin and reduce detrimental effects of being sedentary    Baseline 8/16- patient reports this is going well at home    Time 12   Period Weeks   Status Achieved               Plan - 06/08/15 1643    Clinical Impression Statement Focused on balance tasks today primarily in order to assist with functional tasks such as ambulation and reduce overall fall risk; patient demonstrates difficulty with functional balance tasks likely due to combination of weakness and impaired proprioception.    Pt will  benefit from skilled therapeutic intervention in order to improve on the following deficits Abnormal gait;Decreased coordination;Decreased range of motion;Difficulty walking;Impaired flexibility;Improper body mechanics;Decreased endurance;Decreased safety awareness;Impaired sensation;Postural dysfunction;Decreased activity tolerance;Decreased balance;Decreased knowledge of use of DME;Pain;Decreased  mobility;Decreased strength   Rehab Potential Good   Clinical Impairments Affecting Rehab Potential chronic ataxia symptoms however patient has reportedly been able to return to walking after other bouts of illness    PT Frequency 2x / week   PT Duration 8 weeks   PT Treatment/Interventions ADLs/Self Care Home Management;Gait training;Neuromuscular re-education;Visual/perceptual remediation/compensation;Stair training;Biofeedback;Functional mobility training;Patient/family education;Passive range of motion;Cryotherapy;Therapeutic activities;Wheelchair mobility training;Electrical Stimulation;Therapeutic exercise;Manual techniques;Energy conservation;DME Instruction;Balance training   PT Next Visit Plan Continue with gait training, focus on balance training.    PT Home Exercise Plan No updates   Consulted and Agree with Plan of Care Patient        Problem List Patient Active Problem List   Diagnosis Date Noted  . Primary hypercoagulable state 03/19/2015  . Chronic respiratory failure 03/19/2015  . Insomnia 02/02/2015  . MRSA pneumonia 10/11/2014  . Abdominal abscess   . Candidemia   . Screen for STD (sexually transmitted disease)   . 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 Perforated gastric ulcer 09/27/2014  . Prediabetes 07/23/2014  . Unspecified hereditary and idiopathic peripheral neuropathy 04/15/2014  . CVA (cerebral infarction) 04/02/2014  . Hypertriglyceridemia 12/11/2013  . Stiffness of joint, not elsewhere classified, ankle and foot 11/12/2013  . Lack of coordination 11/03/2013  . Pernicious anemia 09/12/2013  . Elevated transaminase level 09/12/2013  . Ataxia 09/09/2013  . Difficulty in walking(719.7) 09/09/2013  . Weakness of both legs 09/09/2013  . Generalized weakness 02/11/2013  . Tobacco abuse 01/14/2013  . Pedal edema 01/13/2013  . Chronic pain syndrome 12/30/2012  . Reactive airway disease 12/30/2012    Deniece Ree PT, DPT [place] [address] [place], [place], [address] Phone: [phone]   Fax:  [phone]

## 2015-06-08 NOTE — Therapy (Signed)
[place], [place], [address] Phone: [phone]   Fax:  [phone]  Occupational Therapy Treatment  Patient Details  Name: [name] MRN: [id number] Date of Birth: 1966/04/20 Referring Provider:  Kathyrn Drown, MD  Encounter Date: 06/08/2015      OT End of Session - 06/08/15 1530    Visit Number 21   Number of Visits 24   Date for OT Re-Evaluation 07/10/15  mini reassess on 06/09/15   Authorization Type Medicare A   Authorization Time Period before 30th visit   Authorization - Visit Number 21   Authorization - Number of Visits 30   OT Start Time 1429   OT Stop Time 1515   OT Time Calculation (min) 46 min   Activity Tolerance Patient tolerated treatment well   Behavior During Therapy [facility] for tasks assessed/performed      Past Medical History  Diagnosis Date  . Anxiety   . Depression   . COPD (chronic obstructive pulmonary disease)   . MS (multiple sclerosis)   . Fibromyalgia   . Impaired fasting glucose   . 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 Eating disorder   . Chronic low back pain   . PONV (postoperative nausea and vomiting)   . Chronic bronchitis     "yearly" (02/11/2013)  . Borderline diabetes   . GERD (gastroesophageal reflux disease)   . Migraine     "I use to" (02/11/2013)  . Arthritis     "hands & feet" (02/11/2013)  . Peripheral neuropathy     [name] 02/11/2013  . B12 deficiency anemia     [name] 02/11/2013  . Chronic pain syndrome     [name] 02/11/2013  . UTI (lower urinary tract infection) 02/11/2013    [name] 02/11/2013  . Chronic edema     BLE/notes 02/11/2013  . Ataxia   . Peripheral neuropathy   . Muscle weakness of lower extremity     bilateral  . Hypertension     Past Surgical History  Procedure Laterality Date  . Abdominal hysterectomy  ~ 1987; ~ 2004    "woodward; ferguson" (02/11/2013)  . Cesarean section  6599; 1984; 1986  . Anterior  cervical decomp/discectomy fusion  2009; 2011  . Tonsillectomy  1990's?  . Cataract extraction w/phaco  06/20/2011    Procedure: CATARACT EXTRACTION PHACO AND INTRAOCULAR LENS PLACEMENT (IOC);  Surgeon: [name] T. Gershon Crane;  Location: [facility];  Service: Ophthalmology;  Laterality: Left;  CDE 1.81  . Cataract extraction w/phaco  07/04/2011    Procedure: CATARACT EXTRACTION PHACO AND INTRAOCULAR LENS PLACEMENT (IOC);  Surgeon: [name] T. Gershon Crane;  Location: [facility];  Service: Ophthalmology;  Laterality: Right;  CDE: 1.76  . Yag laser application Right 3/57/0177    Procedure: YAG LASER APPLICATION;  Surgeon: [name] T. Gershon Crane, MD;  Location: [facility];  Service: Ophthalmology;  Laterality: Right;  . Yag laser application Left 9/39/0300    Procedure: YAG LASER APPLICATION;  Surgeon: [name] T. Gershon Crane, MD;  Location: [facility];  Service: Ophthalmology;  Laterality: Left;  . Appendectomy    . Laparotomy N/A 09/27/2014    Procedure: Exploratory Laparotomy, Biopsy of Perforated Gastric Ulcer, Closure with omental patch;  Surgeon: [name], MD;  Location: [place];  Service: General;  Laterality: N/A;  . Embolectomy Left 09/27/2014    Procedure: Left Brachial, Radial,Ulnar Embolectomy with patch angioplasty left brachial, radial and ulnar artery;  Surgeon: [name], MD;  Location:  [facility] OR;  Service: Vascular;  Laterality: Left;  . Embolectomy Left 09/27/2014    Procedure: Left Radial, Brachial, and Ulnar Thrombectomy; Left Brachial to Radial Bypass Graft using Greater Saphenous vein graft from Left Thigh; Left Saphenous Vein Harvest; Intraoperative Arteriogram; Intra-arterial administration of TPA;  Surgeon: [name], MD;  Location: [facility] OR;  Service: Vascular;  Laterality: Left;  . Tracheostomy N/A december 2015    There were no vitals filed for this visit.  Visit Diagnosis:  Lack of coordination  Hand muscle weakness  Stiffness of hand joint, left      Subjective Assessment - 06/08/15 1534     Subjective  S: I'm going to make a coconut cake this week.    Currently in Pain? No/denies            [facility] OT Assessment - 06/08/15 1535    Assessment   Diagnosis left hand spaticity   Precautions   Precautions Fall                  OT Treatments/Exercises (OP) - 06/08/15 1446    ADLs   Cooking Patient completed simple meal prep task with muffins. Pt required min assist with opening muffin mix, OTR began the opening and pt finished tearing off top of muffin mix packet. Pt stood at counter top to place muffin cups into muffin tin, using right hand to place cups. Pt remained in standing to pour mix and water into bowl and stir muffin mix. Pt sat at table and spooned mix into muffin tin, using right hand to scoop mix and left hand to scrap mix off spoon with another spoon. Pt required min assist to scrape remaining mix out of bowl. Once muffins cooked and tin cooled, pt used bilateral hands to remove muffins from tin and place onto plate to take home.    Exercises   Exercises Hand   Fine Motor Coordination   Other Fine Motor Exercises Pt completed pinch activity using yellow and red resistive clothespin to grasp and place high resistance sponges into bucket. Pt used lateral pinch with left hand and 3 point pinch with right hand to pick up sponges. Pt used red clothespin with right hand and yellow with left hand. Pt was able to complete 20/20 with left and right hand. Pt required extended time to complete task.                   OT Short Term Goals - 05/25/15 1649    OT SHORT TERM GOAL #1   Title Patient will be educated and independent with HEP.   Status On-going   OT SHORT TERM GOAL #2   Title Patient will increase AROM of left hand by 5 degrees to increase ability to use left hand during daily tasks.    OT SHORT TERM GOAL #3   Title Therapist will fabricate finger extension splint to defer the chance of progressing spaticity.    OT SHORT TERM GOAL #4   Title  Patient will increase coordination in Bil hands by decreasing completion time of  9 hole peg test by 10 seconds.    OT SHORT TERM GOAL #5   Title Patient will increase grip strength by 5# and pinch strength by 3#  of left hand to increase ability to use left hand as an active assist during daily tasks.            OT Long Term Goals - 05/25/15 1649    OT  LONG TERM GOAL #1   Title Patient will return to highest level of independence with all daily activities.    Status On-going   OT LONG TERM GOAL #2   Title Patient will increase AROM of left hand by 10 degrees.   Status On-going   OT LONG TERM GOAL #3   Title Patient will increase PROM of left hand to [facility] to decrease the progression of spaticity   Status Partially Met   OT LONG TERM GOAL #4   Title Patient will increase grip strength by 10# and pinch strength by 5# in LUE.   Status On-going   OT LONG TERM GOAL #5   Title Patient will increase Bil coordination by decreasing completion time of 9 hole peg test by 30 seconds.    Status Partially Met               Plan - 06/08/15 1531    Clinical Impression Statement A: Pt completed meal prep task focusing on pinch and grip strength and fine motor coordination needed to open containers and prepare meal. Pt was unable to open muffin mix independently, however completed the majority of the activity with set-up.    Plan P: Mini-reassessment. Continue grip & pinch activities.         Problem List Patient Active Problem List   Diagnosis Date Noted  . Primary hypercoagulable state 03/19/2015  . Chronic respiratory failure 03/19/2015  . Insomnia 02/02/2015  . MRSA pneumonia 10/11/2014  . Abdominal abscess   . Candidemia   . Screen for STD (sexually transmitted disease)   . 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 Perforated gastric ulcer 09/27/2014  . Prediabetes 07/23/2014  . Unspecified hereditary and idiopathic peripheral neuropathy 04/15/2014  . CVA (cerebral  infarction) 04/02/2014  . Hypertriglyceridemia 12/11/2013  . Stiffness of joint, not elsewhere classified, ankle and foot 11/12/2013  . Lack of coordination 11/03/2013  . Pernicious anemia 09/12/2013  . Elevated transaminase level 09/12/2013  . Ataxia 09/09/2013  . Difficulty in walking(719.7) 09/09/2013  . Weakness of both legs 09/09/2013  . Generalized weakness 02/11/2013  . Tobacco abuse 01/14/2013  . Pedal edema 01/13/2013  . Chronic pain syndrome 12/30/2012  . Reactive airway disease 12/30/2012    Guadelupe Sabin, OTR/L  [phone]  06/08/2015, 3:35 PM  [place] [address] [place], [place], [address] Phone: [phone]   Fax:  [phone]

## 2015-06-10 ENCOUNTER — Ambulatory Visit (HOSPITAL_COMMUNITY): Payer: Self-pay | Admitting: Speech Pathology

## 2015-06-10 ENCOUNTER — Encounter (HOSPITAL_COMMUNITY): Payer: Self-pay

## 2015-06-10 ENCOUNTER — Encounter (HOSPITAL_COMMUNITY): Payer: Self-pay | Admitting: Occupational Therapy

## 2015-06-16 ENCOUNTER — Encounter (HOSPITAL_COMMUNITY): Payer: BLUE CROSS/BLUE SHIELD | Attending: Hematology & Oncology | Admitting: Hematology & Oncology

## 2015-06-16 VITALS — BP 90/60 | HR 64 | Temp 98.1°F | Resp 18

## 2015-06-16 DIAGNOSIS — G8929 Other chronic pain: Secondary | ICD-10-CM | POA: Diagnosis not present

## 2015-06-16 DIAGNOSIS — M792 Neuralgia and neuritis, unspecified: Secondary | ICD-10-CM | POA: Insufficient documentation

## 2015-06-16 DIAGNOSIS — D6489 Other specified anemias: Secondary | ICD-10-CM

## 2015-06-16 DIAGNOSIS — M549 Dorsalgia, unspecified: Secondary | ICD-10-CM | POA: Insufficient documentation

## 2015-06-16 DIAGNOSIS — I749 Embolism and thrombosis of unspecified artery: Secondary | ICD-10-CM | POA: Diagnosis not present

## 2015-06-16 DIAGNOSIS — G709 Myoneural disorder, unspecified: Secondary | ICD-10-CM | POA: Insufficient documentation

## 2015-06-16 DIAGNOSIS — R29898 Other symptoms and signs involving the musculoskeletal system: Secondary | ICD-10-CM | POA: Diagnosis not present

## 2015-06-16 LAB — CBC WITH DIFFERENTIAL/PLATELET
BASOS ABS: 0 10*3/uL (ref 0.0–0.1)
BASOS PCT: 0 % (ref 0–1)
Eosinophils Absolute: 0.1 10*3/uL (ref 0.0–0.7)
Eosinophils Relative: 2 % (ref 0–5)
HEMATOCRIT: 40.1 % (ref 36.0–46.0)
HEMOGLOBIN: 13.3 g/dL (ref 12.0–15.0)
Lymphocytes Relative: 40 % (ref 12–46)
Lymphs Abs: 2.2 10*3/uL (ref 0.7–4.0)
MCH: 31.4 pg (ref 26.0–34.0)
MCHC: 33.2 g/dL (ref 30.0–36.0)
MCV: 94.8 fL (ref 78.0–100.0)
MONO ABS: 0.3 10*3/uL (ref 0.1–1.0)
Monocytes Relative: 6 % (ref 3–12)
NEUTROS ABS: 2.8 10*3/uL (ref 1.7–7.7)
NEUTROS PCT: 52 % (ref 43–77)
Platelets: 272 10*3/uL (ref 150–400)
RBC: 4.23 MIL/uL (ref 3.87–5.11)
RDW: 12.2 % (ref 11.5–15.5)
WBC: 5.4 10*3/uL (ref 4.0–10.5)

## 2015-06-16 NOTE — Patient Instructions (Signed)
..  [place] [facility] Discharge Instructions  RECOMMENDATIONS MADE BY THE CONSULTANT AND ANY TEST RESULTS WILL BE SENT TO YOUR REFERRING PHYSICIAN.  You will return in 6 months.  We did lab work today, CBC.    Thank you for choosing [place] at [facility] to provide your oncology and hematology care.  To afford each patient quality time with our provider, please arrive at least 15 minutes before your scheduled appointment time.    You need to re-schedule your appointment should you arrive 10 or more minutes late.  We strive to give you quality time with our providers, and arriving late affects you and other patients whose appointments are after yours.  Also, if you no show three or more times for appointments you may be dismissed from the clinic at the providers discretion.     Again, thank you for choosing [facility]  Our hope is that these requests will decrease the amount of time that you wait before being seen by our physicians.       _____________________________________________________________  Should you have questions after your visit to [facility], please contact our office at [phone] between the hours of 8:30 a.m. and 4:30 p.m.  Voicemails left after 4:30 p.m. will not be returned until the following business day.  For prescription refill requests, have your pharmacy contact our office.

## 2015-06-16 NOTE — Progress Notes (Signed)
[facility] NOTE  Patient Care Team: Kathyrn Drown, MD as PCP - General (Family Medicine) Flossie Buffy. Redmond Pulling, MD as Referring Physician (Otolaryngology)  CHIEF COMPLAINTS/PURPOSE OF CONSULTATION:   LUE Arterial Thrombus in the setting of acute illness History of pernicious anemia Multiple Sclerosis History of Guillian Barre Syndrome  ? 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 female is here because of an arterial thrombosis. She has a very complicated medical history. She was admitted to the [facility] system on December 19 of 2015 with a CT of the abdomen showing pneumoperitoneum. She had a cold left hand concerning for an arterial clot. On 09/27/2014 she underwent a left brachial and ulnar artery embolectomy and also an exploratory laparotomy with an omental patch for a perforated prepyloric gastric ulcer. She developed a reocclusion of the left upper extremity and underwent a left brachial to left radial artery bypass with left leg greater saphenous vein. During her hospital admission she was coded for 4 minutes. She remarkably improved and was discharged from the hospital.  Prior to her hospitalization the patient noted a 1 day history of her hand feeling "weird" she has a history of neuropathy in that hand. At the time of her hospitalization in December she was a current smoker.   Patient is in a wheelchair, but she does state that she is getting up and walking around some with the help of a walker.  She has some trouble speaking, but states that she's "staying out of trouble".  She expresses concern about her  sons and grandchildren being tested to make sure they don't "get what she has." She is aware that one of her sons is also a factor V Leiden heterozygote.  MEDICAL HISTORY:  Past Medical History  Diagnosis Date  . Anxiety   . Depression   . COPD (chronic obstructive pulmonary disease)   . MS (multiple sclerosis)   . Fibromyalgia   . Impaired fasting glucose   . 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 Eating disorder   . Chronic low back pain   . PONV (postoperative nausea and vomiting)   . Chronic bronchitis     "yearly" (02/11/2013)  . Borderline diabetes   . GERD (gastroesophageal reflux disease)   . Migraine     "I use to" (02/11/2013)  . Arthritis     "hands & feet" (02/11/2013)  . Peripheral neuropathy     [name] 02/11/2013  . B12 deficiency anemia     [name] 02/11/2013  . Chronic pain syndrome     [name] 02/11/2013  . UTI (lower urinary tract infection) 02/11/2013    [name] 02/11/2013  . Chronic edema     BLE/notes 02/11/2013  . Ataxia   . Peripheral neuropathy   . Muscle weakness of lower extremity     bilateral  . Hypertension     SURGICAL HISTORY: Past Surgical History  Procedure Laterality Date  . Abdominal hysterectomy  ~ 1987; ~ 2004    "[place]; [place]" (02/11/2013)  . Cesarean section  7510; 1984; 1986  . Anterior cervical decomp/discectomy fusion  2009; 2011  . Tonsillectomy  1990's?  . Cataract  extraction w/phaco  06/20/2011    Procedure: CATARACT EXTRACTION PHACO AND INTRAOCULAR LENS PLACEMENT (IOC);  Surgeon: [name] T. Gershon Crane;  Location: [facility];  Service: Ophthalmology;  Laterality: Left;  CDE 1.81  . Cataract extraction w/phaco  07/04/2011    Procedure: CATARACT EXTRACTION PHACO AND INTRAOCULAR LENS PLACEMENT (IOC);  Surgeon: [name] T. Gershon Crane;  Location: [facility];  Service: Ophthalmology;  Laterality: Right;  CDE: 1.76  . Yag laser application Right 1/69/6789    Procedure: YAG LASER APPLICATION;  Surgeon: [name] T. Gershon Crane, MD;  Location:  [facility];  Service: Ophthalmology;  Laterality: Right;  . Yag laser application Left 3/81/0175    Procedure: YAG LASER APPLICATION;  Surgeon: [name] T. Gershon Crane, MD;  Location: [facility];  Service: Ophthalmology;  Laterality: Left;  . Appendectomy    . Laparotomy N/A 09/27/2014    Procedure: Exploratory Laparotomy, Biopsy of Perforated Gastric Ulcer, Closure with omental patch;  Surgeon: [name], MD;  Location: [facility];  Service: General;  Laterality: N/A;  . Embolectomy Left 09/27/2014    Procedure: Left Brachial, Radial,Ulnar Embolectomy with patch angioplasty left brachial, radial and ulnar artery;  Surgeon: [name], MD;  Location: [facility];  Service: Vascular;  Laterality: Left;  . Embolectomy Left 09/27/2014    Procedure: Left Radial, Brachial, and Ulnar Thrombectomy; Left Brachial to Radial Bypass Graft using Greater Saphenous vein graft from Left Thigh; Left Saphenous Vein Harvest; Intraoperative Arteriogram; Intra-arterial administration of TPA;  Surgeon: [name], MD;  Location: [facility];  Service: Vascular;  Laterality: Left;  . Tracheostomy N/A december 2015    SOCIAL HISTORY: Social History   Social History  . Marital Status: Divorced    Spouse Name: N/A  . Number of Children: N/A  . Years of Education: N/A   Occupational History  . Not on file.   Social History Main Topics  . Smoking status: Current Every Day Smoker -- 1.00 packs/day for 33 years    Types: Cigarettes  . Smokeless tobacco: Former Systems developer    Quit date: 09/08/2014  . Alcohol Use: No  . Drug Use: No  . Sexual Activity: Not Currently    Birth Control/ Protection: Surgical   Other Topics Concern  . Not on file   Social History Narrative   he is divorced. She was a 2 pack per day smoker her entire life until she quit in December of last year. She has 2 sons, 3 grandchildren. She does not drink alcohol. 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   FAMILY HISTORY: Family History  Problem  Relation Age of Onset  . Anesthesia problems Neg Hx   . Hypotension Neg Hx   . Malignant hyperthermia Neg Hx   . Pseudochol deficiency Neg Hx   . Hyperlipidemia Mother   . Heart disease Mother   . Cancer Mother     lung  . Diabetes Father   . Heart disease Father   . Cancer Maternal Grandmother     breast   indicated that her mother is deceased. She indicated that her father is deceased.  Other died at 63 from lung cancer, she was a heavy smoker. Father died at 71 from an MI. She has no family history of blood clots. He has 2 brothers and 1 sister.   ALLERGIES:  is allergic to ambien; ceftin; hctz; lodine; promethazine; roxicodone; codeine; latex; and penicillins.  MEDICATIONS:  Current Outpatient Prescriptions  Medication Sig Dispense Refill  . apixaban (ELIQUIS) 5 MG  TABS tablet Take 5 mg by mouth daily.    . hydrocortisone 2.5 % cream Apply topically 2 (two) times daily. 60 g 2  . ONE TOUCH ULTRA TEST test strip     . ONETOUCH DELICA LANCETS 16X MISC     . pantoprazole (PROTONIX) 40 MG tablet Take 1 tablet (40 mg total) by mouth daily. 30 tablet 6  . senna (SENOKOT) 8.6 MG tablet Take 1 tablet (8.6 mg total) by mouth 2 (two) times daily. 60 tablet 5  . Thiamine HCl (VITAMIN B-1 PO) Take by mouth daily.    . traZODone (DESYREL) 100 MG tablet TAKE 2 TABLETS BY MOUTH AT BEDTIME. 60 tablet 6  . vitamin B-12 (CYANOCOBALAMIN) 1000 MCG tablet Take 1,000 mcg by mouth daily.    . folic acid (FOLVITE) 1 MG tablet TAKE 1 TABLET BY MOUTH ONCE DAILY. 30 tablet 4  . gabapentin (NEURONTIN) 300 MG capsule Take 2 capsules tid 180 capsule 5  . oxyCODONE-acetaminophen (PERCOCET/ROXICET) 5-325 MG per tablet Take 1 tablet by mouth 3 (three) times daily as needed for severe pain. 90 tablet 0  . potassium chloride (K-DUR) 10 MEQ tablet TAKE 2 TABLETS BY MOUTH ONCE DAILY. 60 tablet 2   No current facility-administered medications for this visit.    Review of Systems  Constitutional: Positive for  malaise/fatigue.  HENT: Negative.   Eyes: Negative.   Respiratory: Positive for shortness of breath.   Cardiovascular: Negative.   Gastrointestinal: Positive for constipation.  Genitourinary: Negative.   Musculoskeletal: Positive for myalgias, back pain and joint pain.  Skin: Negative.   Neurological: Positive for sensory change and weakness.  Endo/Heme/Allergies: Negative.   Psychiatric/Behavioral: The patient has insomnia.   14 point review of systems was performed and is negative except as detailed under history of present illness and above   PHYSICAL EXAMINATION:  ECOG PERFORMANCE STATUS: 2 - Symptomatic, <50% confined to bed  Filed Vitals:   06/16/15 1344  BP: 90/60  Pulse: 64  Temp: 98.1 F (36.7 C)  Resp: 18   There were no vitals filed for this visit.   Physical Exam  Constitutional: She is oriented to person, place, and time and well-developed, well-nourished, and in no distress.      In a wheelchair  HENT:  Head: Normocephalic and atraumatic.  Nose: Nose normal.  Mouth/Throat: Oropharynx is clear and moist. No oropharyngeal exudate.  Eyes: Conjunctivae and EOM are normal. Pupils are equal, round, and reactive to light. Right eye exhibits no discharge. Left eye exhibits no discharge. No scleral icterus.  Neck: Normal range of motion. Neck supple. No tracheal deviation present. No thyromegaly present.  Cardiovascular: Normal rate, regular rhythm and normal heart sounds.  Exam reveals no gallop and no friction rub.   No murmur heard. Pulmonary/Chest: Effort normal and breath sounds normal. She has no wheezes. She has no rales.  Abdominal: Soft. Bowel sounds are normal. She exhibits no distension and no mass. There is no tenderness. There is no rebound and no guarding.  Musculoskeletal: Normal range of motion.   Lymphadenopathy:    She has no cervical adenopathy.  Neurological: She is alert and oriented to person, place, and time. No cranial nerve deficit.       Gait not assessed  Skin: Skin is warm and dry. No rash noted.  Psychiatric: Mood, memory, affect and judgment normal.  Nursing note and vitals reviewed.    LABORATORY DATA:  I have reviewed the data as listed Results for COLINDA, BARTH (MRN  [id number]) as of 06/23/2015 08:36  Ref. Range 06/16/2015 14:25  WBC Latest Ref Range: 4.0-10.5 K/uL 5.4  RBC Latest Ref Range: 3.87-5.11 MIL/uL 4.23  Hemoglobin Latest Ref Range: 12.0-15.0 g/dL 13.3  HCT Latest Ref Range: 36.0-46.0 % 40.1  MCV Latest Ref Range: 78.0-100.0 fL 94.8  MCH Latest Ref Range: 26.0-34.0 pg 31.4  MCHC Latest Ref Range: 30.0-36.0 g/dL 33.2  RDW Latest Ref Range: 11.5-15.5 % 12.2  Platelets Latest Ref Range: 150-400 K/uL 272  Neutrophils Latest Ref Range: 43-77 % 52  Lymphocytes Latest Ref Range: 12-46 % 40  Monocytes Relative Latest Ref Range: 3-12 % 6  Eosinophil Latest Ref Range: 0-5 % 2  Basophil Latest Ref Range: 0-1 % 0  NEUT# Latest Ref Range: 1.7-7.7 K/uL 2.8  Lymphocyte # Latest Ref Range: 0.7-4.0 K/uL 2.2  Monocyte # Latest Ref Range: 0.1-1.0 K/uL 0.3  Eosinophils Absolute Latest Ref Range: 0.0-0.7 K/uL 0.1  Basophils Absolute Latest Ref Range: 0.0-0.1 K/uL 0.0    Results for UNICE, VANTASSEL (MRN [id number])   Ref. Range 01/13/2015 16:10 02/02/2015 14:59 02/10/2015 12:32  Anticardiolipin IgA Latest Ref Range: 0-11 APL U/mL <9    Anticardiolipin IgG Latest Ref Range: 0-14 GPL U/mL <9    Anticardiolipin IgM Latest Ref Range: 0-12 MPL U/mL <9    PTT Lupus Anticoagulant Latest Ref Range: 0.0-50.0 sec NFPR  40.5  DRVVT Latest Ref Range: 0.0-55.1 sec TNP  47.4  Lupus Anticoag Interp Unknown   Comment:  Beta-2 Glyco I IgG Latest Ref Range: 0-20 GPI IgG units <9    Beta-2-Glycoprotein I IgA Latest Ref Range: 0-25 GPI IgA units <9    Beta-2-Glycoprotein I IgM Latest Ref Range: 0-32 GPI IgM units <9    Antithrombin Activity Latest Ref Range: 75-120 %   121 (H)  D-Dimer, Quant Latest Ref Range: 0.00-0.48 ug/mL-FEU   <0.27    Recommendations-F5LEID: Unknown Comment (A)    Recommendations-PTGENE: Unknown Comment    Additional Information Unknown Comment    Protein C Activity Latest Ref Range: 74-151 %   159 (H)  Protein C, Total Latest Ref Range: 70-140 %   119  Protein S Activity Latest Ref Range: 60-145 %   73  Protein S Ag, Total Latest Ref Range: 58-150 %   137     ASSESSMENT & PLAN:  Factor V Leiden mutation, heterozygous Arterial thrombosis L brachial radial and ulnar arteries Thrombosis of L Subclavian Artery Thrombectomy of L brachial, radial, and ulnar arteries Patch angioplasty of the Left Brachial and ulnar artery Patch angioplasty of the Left Radial artery Redo of left brachial, ulnar and radial exposure Thrombectomy Left brachial, ulnar and radial artery Angiogram LUE Left brachial to left radial artery bypass with left leg greater saphenous vein Intra-arterial injection of TPA   49 year old female with multiple sclerosis and limited functional status secondary to her disease. She presented with abdominal pain and was noted at presentation to have a cold left upper extremity.She underwent an embolectomy,  reoccluded and ultimately underwent a left brachial to left radial artery bypass graft with left leg greater saphenous vein. Ultrasound also showed thrombosis of the left subclavian artery. TEE was performed that was negative for source of emboli.  She is currently on Eliquis  She needs to continue with anticoagulation. She is heterozygous for Factor V Leiden. Although not commonly associated with arterial thrombosis, I recommend ongoing Eliquis therapy for now especially given the serious nature of her thrombosis.   She is tolerating Eliquis therapy without difficulty.  CBC ordered today to check on her anemia. Counts are excellent.  We will follow up with her every 6 months.  All questions were answered. The patient knows to call the clinic with any problems, questions or concerns. This  note was electronically signed.    This document serves as a record of services personally performed by Ancil Linsey, MD. It was created on her behalf by Toni Amend, a trained medical scribe. The creation of this record is based on the scribe's personal observations and the provider's statements to them. This document has been checked and approved by the attending provider.  I have reviewed the above documentation for accuracy and completeness, and I agree with the above. Molli Hazard, MD  06/23/2015 8:36 AM

## 2015-06-17 ENCOUNTER — Encounter (HOSPITAL_COMMUNITY): Payer: Self-pay

## 2015-06-17 ENCOUNTER — Ambulatory Visit (INDEPENDENT_AMBULATORY_CARE_PROVIDER_SITE_OTHER): Payer: BLUE CROSS/BLUE SHIELD | Admitting: Family Medicine

## 2015-06-17 ENCOUNTER — Encounter (HOSPITAL_COMMUNITY): Payer: Self-pay | Admitting: Physical Therapy

## 2015-06-17 ENCOUNTER — Encounter: Payer: Self-pay | Admitting: Family Medicine

## 2015-06-17 VITALS — BP 108/68 | Ht 59.0 in | Wt 124.0 lb

## 2015-06-17 DIAGNOSIS — R7309 Other abnormal glucose: Secondary | ICD-10-CM

## 2015-06-17 DIAGNOSIS — Z23 Encounter for immunization: Secondary | ICD-10-CM | POA: Diagnosis not present

## 2015-06-17 DIAGNOSIS — K219 Gastro-esophageal reflux disease without esophagitis: Secondary | ICD-10-CM | POA: Diagnosis not present

## 2015-06-17 DIAGNOSIS — G894 Chronic pain syndrome: Secondary | ICD-10-CM

## 2015-06-17 DIAGNOSIS — D6852 Prothrombin gene mutation: Secondary | ICD-10-CM

## 2015-06-17 DIAGNOSIS — I749 Embolism and thrombosis of unspecified artery: Secondary | ICD-10-CM

## 2015-06-17 DIAGNOSIS — G4734 Idiopathic sleep related nonobstructive alveolar hypoventilation: Secondary | ICD-10-CM | POA: Diagnosis not present

## 2015-06-17 DIAGNOSIS — D6859 Other primary thrombophilia: Secondary | ICD-10-CM

## 2015-06-17 DIAGNOSIS — R7303 Prediabetes: Secondary | ICD-10-CM

## 2015-06-17 MED ORDER — OXYCODONE-ACETAMINOPHEN 5-325 MG PO TABS: 1.0000 | ORAL_TABLET | Freq: Three times a day (TID) | ORAL | Status: DC | PRN

## 2015-06-17 MED ORDER — GABAPENTIN 300 MG PO CAPS: ORAL_CAPSULE | ORAL | Status: DC

## 2015-06-17 NOTE — Progress Notes (Addendum)
Subjective:    Patient ID: [name], female    DOB: 20-Oct-1965, 49 y.o.   MRN: [id number]  HPI This patient was seen today for a face-to-face evaluation for power wheelchair. This patient has severe debilitating neurologic condition that causes her to be wheelchair bound. She does have limited strength in her arms. She is not capable of pushing a wheelchair by herself. She is capable operating a power wheelchair. Her neurologic condition is partly due to B12 deficiency, nerve dysfunction, it is a chronic and debilitating condition that will not get better. She is followed by specialist. This patient has been wheelchair bound for the past couple years. The patient cannot walk. Even with braces or crutches she cannot walk. The patient has had some falls before she became wheelchair-bound. The patient is unable to use a manual wheelchair. She requires a in-home assistance 24 hours a day to help her move around. A scooter would not be sufficient for this patient. She will need a powered wheelchair with hand operation. I believe the patient would be able to move about in her home and perform activities of daily living if she had access to a powered wheelchair. Please see below for physical evaluation. Patient has significant upper extremity weakness. She is unable to walk. She does not have the stamina or the strength to walk. Physical therapy has been used in the past and she is currently going through physical therapy. It is not felt that she would eat able to ever walk or use a manual wheelchair. This patient is not capable of using a wheelchair that is manual to get around her house to be able to use the bathroom, be able to go to the kitchen, or change wounds. A powered wheelchair would allow her to do that. Also seen for pain management. Pain mostly in feet and hands. Pt states pain med helps some. She feels like it should be increased.   The medication list was reviewed and updated.   -Compliance  with pain medication: yes  The patient was advised the importance of maintaining medication and not using illegal substances with these.  Refills needed: yes  The patient was educated that we can provide 3 monthly scripts for their medication, it is their responsibility to follow the instructions.  Side effects or complications from medications: none  Patient is aware that pain medications are meant to minimize the severity of the pain to allow their pain levels to improve to allow for better function. They are aware of that pain medications cannot totally remove their pain.  Due for UDT ( at least once per year) : done at last visit  Pt taking gabapentin 2 tid. Med list states one bid.       Review of Systems  Constitutional: Positive for fatigue. Negative for fever.  HENT: Negative for congestion and sore throat.   Respiratory: Negative for cough and shortness of breath.   Cardiovascular: Negative for chest pain and leg swelling.  Gastrointestinal: Negative for abdominal pain and diarrhea.  Genitourinary: Positive for difficulty urinating. Negative for hematuria and flank pain.  Musculoskeletal: Positive for back pain, arthralgias, gait problem and neck stiffness.  Skin: Negative for rash.  Allergic/Immunologic: Negative for immunocompromised state.  Neurological: Positive for speech difficulty and numbness. Negative for tremors and syncope.  Hematological: Bruises/bleeds easily.  Psychiatric/Behavioral: Negative for confusion, decreased concentration and agitation.    she denies any chest tightness pressure pain shortness of breath currently denies abdominal pain relates weakness in  the arms and legs. Denies headaches. She does relate significant low back pain with sciatica into both legs. She also relates neuropathy in both legs as well as burning in the legs. She does have difficult time with ADLs. Her husband helps her get out of bed and into the wheelchair. The patient does  need assistance with bathing and needs family to fix her food she can feed herself.    Objective:   Physical Exam  Constitutional: She is oriented to person, place, and time. No distress.  HENT:  Head: Normocephalic and atraumatic.  Neck: No tracheal deviation present. No thyromegaly present.  Cardiovascular: Normal rate, regular rhythm and normal heart sounds.   No murmur heard. Pulmonary/Chest: Effort normal and breath sounds normal. No stridor. No respiratory distress.  Abdominal: Soft. There is no tenderness.  Neurological: She is alert and oriented to person, place, and time.  Skin: Skin is warm and dry. No rash noted.    lungs are clear hearts regular she has weakness in both arms and legs. Her grip strength on the right is 4 out of 5 her mobility with the hands seems to be fairly good with some restrictions left side she is severely impaired 3+ over 5 strengthen legs is very poor 3+ over 5 in both legs with limited mobility. Abdomen is soft extremities no edema. Neurologic gross normal patient has decent ability to interact carry a conversation has some speech impediment due to her neurologic condition.       Assessment & Plan:   in my opinion this patient would benefit from a motorized scooter. I believe that it would help her with independence within the home. I believe this patient would benefit from physical therapy doing a formal evaluation to see if they feel a scooter or motorized wheelchair would be best for this patient. This patient has permanent disability. It is not likely that her strength will improve. She is never going to be able to be strong enough to push wheelchair herself. She will never be strong enough to walk.   Chronic pain with sciatica into the legs I would recommend not increasing her pain medicine more than 3 a day. 3 prescriptions were given she was cautioned not to take these at bedtime cautioned that we can cause drowsiness   this patient also is on  chronic blood thinners because of underlying hereditary condition. She will be on this lifetime she was told him the warning signs of bleeding and what to watch for  neuropathy- her Neurontin was increased per direction of her neurologist. She states she takes 2 morning to be admitted to the evening so we went ahead and refilled according to this direction.   She states her gastritis seems to be doing good on pantoprazole she is to continue this watch diet closely   she is also taking her potassium as directed and her recent lab work overall looks good  she does have prediabetes she was encouraged watch starches in her diet closely.   multiple different items were covered today including chronic pain, mobility, chronic hypercoagulability, prediabetes, gastritis, hypokalemia , greater than 40 minutes was spent with patient discussing these multiple issues.  Referral to physical therapy for evaluation regarding wheelchair is recommended we will go ahead and set that up. The patient will follow-up with [place] after this consultation with physical therapy. Then we will submit her prescription for powered wheelchair with date of completion of the face-to-face examination along with pertinent diagnosis and condition and  lifetime length of need our signature and are date of signature

## 2015-06-20 ENCOUNTER — Telehealth: Payer: Self-pay | Admitting: Family Medicine

## 2015-06-20 DIAGNOSIS — R531 Weakness: Secondary | ICD-10-CM

## 2015-06-20 NOTE — Telephone Encounter (Signed)
Nurse's-this patient was seen last Thursday for evaluation regarding power wheelchair. I read through the several pages that she brought with her from [place]. This patient will need a physical therapy consultation specifically for evaluation of a power wheelchair. Please set this up. Also this patient will need a follow-up office visit 1 week after that consultation in order to review the results of that consultation and finish her documentation so that she can get her wheelchair. This needs to be completed within the next 3-4 weeks. This patient is already seeing physical therapy. It is perfectly fine to set it up with that physical therapy but once again this is a time sensitive process. Thank you for expediting

## 2015-06-21 ENCOUNTER — Other Ambulatory Visit: Payer: Self-pay | Admitting: Family Medicine

## 2015-06-21 NOTE — Telephone Encounter (Signed)
[facility]

## 2015-06-22 ENCOUNTER — Ambulatory Visit (HOSPITAL_COMMUNITY): Payer: BLUE CROSS/BLUE SHIELD

## 2015-06-22 ENCOUNTER — Ambulatory Visit (HOSPITAL_COMMUNITY): Payer: BLUE CROSS/BLUE SHIELD | Attending: Family Medicine | Admitting: Physical Therapy

## 2015-06-22 DIAGNOSIS — M6289 Other specified disorders of muscle: Secondary | ICD-10-CM | POA: Insufficient documentation

## 2015-06-22 DIAGNOSIS — R293 Abnormal posture: Secondary | ICD-10-CM | POA: Diagnosis not present

## 2015-06-22 DIAGNOSIS — M6281 Muscle weakness (generalized): Secondary | ICD-10-CM

## 2015-06-22 DIAGNOSIS — M25642 Stiffness of left hand, not elsewhere classified: Secondary | ICD-10-CM | POA: Diagnosis not present

## 2015-06-22 DIAGNOSIS — R270 Ataxia, unspecified: Secondary | ICD-10-CM | POA: Diagnosis not present

## 2015-06-22 DIAGNOSIS — R29898 Other symptoms and signs involving the musculoskeletal system: Secondary | ICD-10-CM | POA: Diagnosis not present

## 2015-06-22 DIAGNOSIS — R279 Unspecified lack of coordination: Secondary | ICD-10-CM | POA: Diagnosis not present

## 2015-06-22 DIAGNOSIS — R269 Unspecified abnormalities of gait and mobility: Secondary | ICD-10-CM | POA: Diagnosis not present

## 2015-06-22 DIAGNOSIS — Z7409 Other reduced mobility: Secondary | ICD-10-CM | POA: Insufficient documentation

## 2015-06-22 DIAGNOSIS — Z789 Other specified health status: Secondary | ICD-10-CM | POA: Insufficient documentation

## 2015-06-22 DIAGNOSIS — M79642 Pain in left hand: Secondary | ICD-10-CM | POA: Diagnosis not present

## 2015-06-22 NOTE — Therapy (Signed)
[place], [place], [address] Phone: [phone]   Fax:  [phone]  Physical Therapy Treatment  Patient Details  Name: [name] MRN: [id number] Date of Birth: 1966/08/19 Referring Provider:  Kathyrn Drown, MD  Encounter Date: 06/22/2015      PT End of Session - 06/22/15 1528    Visit Number 29   Number of Visits 32   Date for PT Re-Evaluation 06/22/15   Authorization Type Medicare    Authorization Time Period 04/06/15-06/06/15; G-code done 24th visit    Authorization - Visit Number 29   Authorization - Number of Visits 34   PT Start Time 2585   PT Stop Time 1518   PT Time Calculation (min) 40 min   Equipment Utilized During Treatment Gait belt   Activity Tolerance Patient tolerated treatment well   Behavior During Therapy [facility] for tasks assessed/performed      Past Medical History  Diagnosis Date  . Anxiety   . Depression   . COPD (chronic obstructive pulmonary disease)   . MS (multiple sclerosis)   . Fibromyalgia   . Impaired fasting glucose   . 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 Eating disorder   . Chronic low back pain   . PONV (postoperative nausea and vomiting)   . Chronic bronchitis     "yearly" (02/11/2013)  . Borderline diabetes   . GERD (gastroesophageal reflux disease)   . Migraine     "I use to" (02/11/2013)  . Arthritis     "hands & feet" (02/11/2013)  . Peripheral neuropathy     [name] 02/11/2013  . B12 deficiency anemia     [name] 02/11/2013  . Chronic pain syndrome     [name] 02/11/2013  . UTI (lower urinary tract infection) 02/11/2013    [name] 02/11/2013  . Chronic edema     BLE/notes 02/11/2013  . Ataxia   . Peripheral neuropathy   . Muscle weakness of lower extremity     bilateral  . Hypertension     Past Surgical History  Procedure Laterality Date  . Abdominal hysterectomy  ~ 1987; ~ 2004    "woodward; ferguson" (02/11/2013)  . Cesarean  section  2778; 1984; 1986  . Anterior cervical decomp/discectomy fusion  2009; 2011  . Tonsillectomy  1990's?  . Cataract extraction w/phaco  06/20/2011    Procedure: CATARACT EXTRACTION PHACO AND INTRAOCULAR LENS PLACEMENT (IOC);  Surgeon: [name] T. Gershon Crane;  [facility]: [facility];  Service: Ophthalmology;  Laterality: Left;  CDE 1.81  . Cataract extraction w/phaco  07/04/2011    Procedure: CATARACT EXTRACTION PHACO AND INTRAOCULAR LENS PLACEMENT (IOC);  Surgeon: [name] T. Gershon Crane;  Location: [facility];  Service: Ophthalmology;  Laterality: Right;  CDE: 1.76  . Yag laser application Right 2/42/3536    Procedure: YAG LASER APPLICATION;  Surgeon: [name] T. Gershon Crane, MD;  Location: [facility];  Service: Ophthalmology;  Laterality: Right;  . Yag laser application Left 1/44/3154    Procedure: YAG LASER APPLICATION;  Surgeon: [name] T. Gershon Crane, MD;  Location: [facility];  Service: Ophthalmology;  Laterality: Left;  . Appendectomy    . Laparotomy N/A 09/27/2014    Procedure: Exploratory Laparotomy, Biopsy of Perforated Gastric Ulcer, Closure with omental patch;  Surgeon: [name], MD;  Location: [place];  Service: General;  Laterality: N/A;  . Embolectomy Left 09/27/2014    Procedure: Left Brachial, Radial,Ulnar Embolectomy with patch angioplasty left brachial, radial and ulnar artery;  Surgeon:  [name], MD;  Location: [facility] OR;  Service: Vascular;  Laterality: Left;  . Embolectomy Left 09/27/2014    Procedure: Left Radial, Brachial, and Ulnar Thrombectomy; Left Brachial to Radial Bypass Graft using Greater Saphenous vein graft from Left Thigh; Left Saphenous Vein Harvest; Intraoperative Arteriogram; Intra-arterial administration of TPA;  Surgeon: [name], MD;  Location: [facility] OR;  Service: Vascular;  Laterality: Left;  . Tracheostomy N/A december 2015    There were no vitals filed for this visit.  Visit Diagnosis:  Lack of coordination  Ataxia  Proximal muscle weakness  Impaired functional mobility and  activity tolerance  Abnormality of gait  Weakness of both legs  Poor posture      Subjective Assessment - 06/22/15 1533    Subjective Pt states she has not been here due to other appointments.  STates her MD wants her to have a power wheelchair.    Currently in Pain? No/denies                         [facility] Adult PT Treatment/Exercise - 06/22/15 1445    Ambulation/Gait   Ambulation/Gait Yes   Ambulation/Gait Assistance 4: Min guard   Ambulation Distance (Feet) 225 Feet   Assistive device Rolling walker   Knee/Hip Exercises: Stretches   Active Hamstring Stretch Both;2 reps;30 seconds   Piriformis Stretch Both;3 reps;60 seconds   Gastroc Stretch Both;3 reps;30 seconds   Gastroc Stretch Limitations passive in supine   Other Knee/Hip Stretches hip adductor stretch 3x30"                  PT Short Term Goals - 05/25/15 1422    PT SHORT TERM GOAL #1   Title Patient will demonstrate a grade of at least 3+/5 in proximal musculature and core in order to promote functional stability and safe mobility skills    Time 6   Period Weeks   Status On-going   PT SHORT TERM GOAL #2   Title Patient will be able to participate in standing tasks with Min(guard) from therapist  for at least 7 minutes at a time    Baseline 8/16- standing is still difficult, not using weights on limbs anymroe    Time 6   Period Weeks   Status Revised   PT SHORT TERM GOAL #3   Title Patient to experience no more than 4/10 at worst on a daily basis at rest and during mobility    Baseline 8/16- pain is worse later in day    Time 6   Period Weeks   Status On-going   PT SHORT TERM GOAL #4   Title Patient will demonstrate the ability to ambulate at least 91f x6 in body weight support system with weighted extremities and minimal fatigue, good gait mechanics throughout    Baseline 8/16- not using BW support system a lot during skilled PT sessions    Time 6   Period Weeks   Status  Deferred   PT SHORT TERM GOAL #5   Title Patient will be able to perform functional transfers, including sit<->stand and stand<->pivot from wheelchair to bed with close supervision and no loss of balance    Baseline 8/16- still needs MIn(A) but getting easier    Time 6   Period Weeks   Status On-going   PT SHORT TERM GOAL #6   Title Patient will demonstrate improved posture while seated in wheelchair and will be able to state and demonstrate  the importance of performing appropriate pressure relieving techniques consistently    Baseline 8/15- patient reports this is all going very well    Time 6   Period Weeks   Status Achieved           PT Long Term Goals - 05/25/15 1426    PT LONG TERM GOAL #1   Title Patient will be independent in safely performed advanced HEP with assistance from caregivers as needed, updated PRN    Time 12   Period Weeks   Status Achieved   PT LONG TERM GOAL #2   Title Patient will demonstrate at least 4/5 to 4+/5 muscle strength in all proximal muscles, bilateral lower extremities, and core    Time 12   Period Weeks   Status On-going   PT LONG TERM GOAL #3   Title Patient will demonstrate bilateral ankle DF of at least 10 degrees in order to improve overall gait mechanics and functional mobility skills    Time 12   Period Weeks   Status On-going   PT LONG TERM GOAL #4   Title Patient will demonstrate the ability to ambulate at least 176f with LRAD, no more than distant supervision, no loss of balance, and minimal fatigue throughout    Baseline 8/16- still using rolling walker, still somewhat unsteady    Time 12   Period Weeks   Status Partially Met   PT LONG TERM GOAL #5   Title Patient will be independent in performing functional transfers, including all bed mobility skills and sit<->stand and stand pivot transfers with Mod(I) and LRAD    Time 12   Period Weeks   Status On-going   PT LONG TERM GOAL #6   Title Patient will demonstrate the ability  to stand at least 15 minutes before fatiguing and with good posture and minimal loss of balance    Time 12   Period Weeks   Status On-going   PT LONG TERM GOAL #7   Title Patient will regularly perform weight bearing activities at home with assistance from caregivers as needed in order to protect skin and reduce detrimental effects of being sedentary    Baseline 8/16- patient reports this is going well at home    Time 12   Period Weeks   Status Achieved               Plan - 06/22/15 1529    Clinical Impression Statement Pt has not been to therapy X 2 weeks and states she has not walked in 1 week.  No new actvities or progression today.  Completed PROM with increased tightness and restriction in LE's as compared to previous sessions.  Pt with decreased stability today with ambulation requiring min to mod assistance.     PT Next Visit Plan Continue with gait training, focus on balance training. Pt needs re-eval next visit as has been 4 weeks since last re-evaluation.         Problem List Patient Active Problem List   Diagnosis Date Noted  . Nocturnal hypoxemia 06/17/2015  . Primary hypercoagulable state 03/19/2015  . Chronic respiratory failure 03/19/2015  . Insomnia 02/02/2015  . MRSA pneumonia 10/11/2014  . Abdominal abscess   . Candidemia   . Screen for STD (sexually transmitted disease)   . 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 Perforated gastric ulcer 09/27/2014  . Prediabetes 07/23/2014  . Unspecified hereditary and idiopathic peripheral neuropathy 04/15/2014  . CVA (cerebral infarction) 04/02/2014  . Hypertriglyceridemia 12/11/2013  .  Stiffness of joint, not elsewhere classified, ankle and foot 11/12/2013  . Lack of coordination 11/03/2013  . Pernicious anemia 09/12/2013  . Elevated transaminase level 09/12/2013  . Ataxia 09/09/2013  . Difficulty in walking(719.7) 09/09/2013  . Weakness of both legs 09/09/2013  . Generalized weakness 02/11/2013  . Tobacco  abuse 01/14/2013  . Pedal edema 01/13/2013  . Chronic pain syndrome 12/30/2012  . Reactive airway disease 12/30/2012    Teena Irani, PTA/CLT [phone]  06/22/2015, 3:33 PM  [facility] [address] [place], [place], [address] Phone: [phone]   Fax:  [phone]

## 2015-06-22 NOTE — Therapy (Signed)
[place], [place], [address] Phone: [phone]   Fax:  [phone]  Occupational Therapy Treatment and Reassessment  Patient Details  Name: [name] MRN: [id number] Date of Birth: 1966/10/09 Referring Provider:  Kathyrn Drown, MD  Encounter Date: 06/22/2015      OT End of Session - 06/22/15 1620    Visit Number 22   Number of Visits 24   Date for OT Re-Evaluation 07/10/15   Authorization Type Medicare A   Authorization Time Period before 32nd visit   Authorization - Visit Number 49   Authorization - Number of Visits 32   OT Start Time 1519   OT Stop Time 1600   OT Time Calculation (min) 41 min   Activity Tolerance Patient tolerated treatment well   Behavior During Therapy [facility] for tasks assessed/performed      Past Medical History  Diagnosis Date  . Anxiety   . Depression   . COPD (chronic obstructive pulmonary disease)   . MS (multiple sclerosis)   . Fibromyalgia   . Impaired fasting glucose   . 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 Eating disorder   . Chronic low back pain   . PONV (postoperative nausea and vomiting)   . Chronic bronchitis     "yearly" (02/11/2013)  . Borderline diabetes   . GERD (gastroesophageal reflux disease)   . Migraine     "I use to" (02/11/2013)  . Arthritis     "hands & feet" (02/11/2013)  . Peripheral neuropathy     [name] 02/11/2013  . B12 deficiency anemia     [name] 02/11/2013  . Chronic pain syndrome     [name] 02/11/2013  . UTI (lower urinary tract infection) 02/11/2013    [name] 02/11/2013  . Chronic edema     BLE/notes 02/11/2013  . Ataxia   . Peripheral neuropathy   . Muscle weakness of lower extremity     bilateral  . Hypertension     Past Surgical History  Procedure Laterality Date  . Abdominal hysterectomy  ~ 1987; ~ 2004    "woodward; ferguson" (02/11/2013)  . Cesarean section  3893; 1984; 1986  . Anterior cervical  decomp/discectomy fusion  2009; 2011  . Tonsillectomy  1990's?  . Cataract extraction w/phaco  06/20/2011    Procedure: CATARACT EXTRACTION PHACO AND INTRAOCULAR LENS PLACEMENT (IOC);  Surgeon: [name] T. Gershon Crane;  Location: [facility];  Service: Ophthalmology;  Laterality: Left;  CDE 1.81  . Cataract extraction w/phaco  07/04/2011    Procedure: CATARACT EXTRACTION PHACO AND INTRAOCULAR LENS PLACEMENT (IOC);  Surgeon: [name] T. Gershon Crane;  Location: [facility];  Service: Ophthalmology;  Laterality: Right;  CDE: 1.76  . Yag laser application Right 7/34/2876    Procedure: YAG LASER APPLICATION;  Surgeon: [name] T. Gershon Crane, MD;  Location: [facility];  Service: Ophthalmology;  Laterality: Right;  . Yag laser application Left 05/19/5725    Procedure: YAG LASER APPLICATION;  Surgeon: [name] T. Gershon Crane, MD;  Location: [facility];  Service: Ophthalmology;  Laterality: Left;  . Appendectomy    . Laparotomy N/A 09/27/2014    Procedure: Exploratory Laparotomy, Biopsy of Perforated Gastric Ulcer, Closure with omental patch;  Surgeon: [name], MD;  Location: [place];  Service: General;  Laterality: N/A;  . Embolectomy Left 09/27/2014    Procedure: Left Brachial, Radial,Ulnar Embolectomy with patch angioplasty left brachial, radial and ulnar artery;  Surgeon: [name], MD;  Location: [place];  Service: Vascular;  Laterality: Left;  . Embolectomy Left 09/27/2014    Procedure: Left Radial, Brachial, and Ulnar Thrombectomy; Left Brachial to Radial Bypass Graft using Greater Saphenous vein graft from Left Thigh; Left Saphenous Vein Harvest; Intraoperative Arteriogram; Intra-arterial administration of TPA;  Surgeon: [name], MD;  Location: [facility] OR;  Service: Vascular;  Laterality: Left;  . Tracheostomy N/A december 2015    There were no vitals filed for this visit.  Visit Diagnosis:  Lack of coordination  Proximal muscle weakness  Hand muscle weakness      Subjective Assessment - 06/22/15 1619    Subjective  S: I feel  like since I've had the Botox in my hand.    Currently in Pain? Yes   Pain Score 4    Pain Location Generalized            [facility] OT Assessment - 06/22/15 1522    Assessment   Diagnosis left hand spaticity   Precautions   Precautions Fall   Coordination   Left 9 Hole Peg Test 1'47"  previous: 2'30"   AROM   Right/Left Wrist Left   Left Wrist Extension 66 Degrees  previous: 60   Left Wrist Flexion 40 Degrees  previous: 30   Left Wrist Radial Deviation 40 Degrees  previous: 48   Left Wrist Ulnar Deviation 30 Degrees  previous:    Right/Left Finger Left   Left Composite Finger Extension 75%   Left Composite Finger Flexion --  previous: 75%   Strength   Overall Strength Comments Assessed seated.   Strength Assessment Site Hand;Shoulder   Right Hand Grip (lbs) 40  previous: 45   Right Hand Lateral Pinch 12 lbs  previous: 12   Right Hand 3 Point Pinch 12 lbs   Left Hand Grip (lbs) 20  previous: 20   Left Hand Lateral Pinch 5 lbs  previous: 6   Left Hand 3 Point Pinch 6 lbs  previous: 5.5                  OT Treatments/Exercises (OP) - 06/22/15 1553    Exercises   Exercises Hand   Hand Exercises   Hand Gripper with Large Beads 6/6 beads gripper set at 7#   Hand Gripper with Medium Beads 5 beads completed in each hand with gripper set at 7#                OT Education - 06/22/15 1617    Education provided Yes   Education Details Education completed regarding diagnosis, prognosis, therapy goals, and progress.    Person(s) Educated Patient   Methods Explanation   Comprehension Verbalized understanding          OT Short Term Goals - 05/25/15 1649    OT SHORT TERM GOAL #1   Title Patient will be educated and independent with HEP.   Status On-going   OT SHORT TERM GOAL #2   Title Patient will increase AROM of left hand by 5 degrees to increase ability to use left hand during daily tasks.    OT SHORT TERM GOAL #3   Title Therapist will  fabricate finger extension splint to defer the chance of progressing spaticity.    OT SHORT TERM GOAL #4   Title Patient will increase coordination in Bil hands by decreasing completion time of  9 hole peg test by 10 seconds.    OT SHORT TERM GOAL #5   Title Patient will increase grip strength by 5#  and pinch strength by 3#  of left hand to increase ability to use left hand as an active assist during daily tasks.            OT Long Term Goals - 07/19/15 1538    OT LONG TERM GOAL #1   Title Patient will return to highest level of independence with all daily activities.    Status On-going   OT LONG TERM GOAL #2   Title Patient will increase AROM of left hand by 10 degrees.   Status Partially Met   OT LONG TERM GOAL #3   Title Patient will increase PROM of left hand to [facility] to decrease the progression of spaticity   Status Achieved   OT LONG TERM GOAL #4   Title Patient will increase grip strength by 10# and pinch strength by 5# in LUE.   Status On-going   OT LONG TERM GOAL #5   Title Patient will increase Bil coordination by decreasing completion time of 9 hole peg test by 30 seconds.    Status Achieved               Plan - 07-19-15 1622    Clinical Impression Statement A: Mini reassesment completed this date. Patient has been attending therapy for 14 weeks now with minimal progress. Pt has met all short term goals and 2/5 and 1 partially met. Discussed patient's progress in therapy and at this time we need to start looking at certain ADL and see if we can increase her independence with compensatory techniques or AE.    Plan P: Focus on LB dressing techniques (assess need for AE (reacher, sock aid, elastic shoelaces) and compensatory techniques. Pt is to think of other areas that she would like to focus on in therapy before discharging soon. We will continue therapy for approx. 2 weeks or less.          G-Codes - 2015-07-19 1634    Functional Assessment Tool Used Clinical  judgement   Functional Limitation Carrying, moving and handling objects   Carrying, Moving and Handling Objects Current Status [phone]) At least 40 percent but less than 60 percent impaired, limited or restricted   Carrying, Moving and Handling Objects Goal Status ([id number]) At least 20 percent but less than 40 percent impaired, limited or restricted      Problem List Patient Active Problem List   Diagnosis Date Noted  . Nocturnal hypoxemia 06/17/2015  . Primary hypercoagulable state 03/19/2015  . Chronic respiratory failure 03/19/2015  . Insomnia 02/02/2015  . MRSA pneumonia 10/11/2014  . Abdominal abscess   . Candidemia   . Screen for STD (sexually transmitted disease)   . 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 Perforated gastric ulcer 09/27/2014  . Prediabetes 07/23/2014  . Unspecified hereditary and idiopathic peripheral neuropathy 04/15/2014  . CVA (cerebral infarction) 04/02/2014  . Hypertriglyceridemia 12/11/2013  . Stiffness of joint, not elsewhere classified, ankle and foot 11/12/2013  . Lack of coordination 11/03/2013  . Pernicious anemia 09/12/2013  . Elevated transaminase level 09/12/2013  . Ataxia 09/09/2013  . Difficulty in walking(719.7) 09/09/2013  . Weakness of both legs 09/09/2013  . Generalized weakness 02/11/2013  . Tobacco abuse 01/14/2013  . Pedal edema 01/13/2013  . Chronic pain syndrome 12/30/2012  . Reactive airway disease 12/30/2012  Occupational Therapy Progress Note  Dates of Reporting Period: 05/06/15 to 07-19-2015  Objective Reports of Subjective Statement: See Clinical impression statement  Objective Measurements: See measurements above  Goal Update:  Pt has  met all short term goals and 2/5 and 1 partially met.  Plan: Focus on LB dressing techniques (assess need for AE (reacher, sock aid, elastic shoelaces) and compensatory techniques. Pt is to think of other areas that she would like to focus on in therapy before discharging soon. We will  continue therapy for approx. 2 weeks or less.  Reason Skilled Services are Required: Skilled OT services needed to continue to work on increasing functional performance during ADL. Remaining therapy to focus on maximizing what function patient has remaining.    Ailene Ravel, OTR/L,CBIS  [phone]  06/22/2015, 4:36 PM  [facility] [address] [place], [place], [address] Phone: [phone]   Fax:  [phone]

## 2015-06-22 NOTE — Telephone Encounter (Signed)
Spoke with patient and informed her per Dr.[name] [name]- This patient will need a physical therapy consultation specifically for evaluation of a power wheelchair. Please set this up. Also this patient will need a follow-up office visit 1 week after that consultation in order to review the results of that consultation and finish her documentation so that she can get her wheelchair. This needs to be completed within the next 3-4 weeks. This patient is already seeing physical therapy. It is perfectly fine to set it up with that physical therapy but once again this is a time sensitive process. Patient verbalized understanding and referral was put in Epic at [facility] where patient is currently going.

## 2015-06-22 NOTE — Addendum Note (Signed)
Addended by: Launa Grill on: 06/22/2015 08:55 AM   Modules accepted: Orders

## 2015-06-23 ENCOUNTER — Other Ambulatory Visit: Payer: Self-pay | Admitting: Family Medicine

## 2015-06-23 ENCOUNTER — Encounter (HOSPITAL_COMMUNITY): Payer: Self-pay | Admitting: Hematology & Oncology

## 2015-06-23 ENCOUNTER — Ambulatory Visit: Payer: Self-pay | Admitting: Pulmonary Disease

## 2015-06-24 ENCOUNTER — Ambulatory Visit (HOSPITAL_COMMUNITY): Payer: BLUE CROSS/BLUE SHIELD | Admitting: Physical Therapy

## 2015-06-24 ENCOUNTER — Encounter (HOSPITAL_COMMUNITY): Payer: Self-pay | Admitting: Occupational Therapy

## 2015-06-24 ENCOUNTER — Ambulatory Visit (HOSPITAL_COMMUNITY): Payer: BLUE CROSS/BLUE SHIELD | Admitting: Occupational Therapy

## 2015-06-24 DIAGNOSIS — R269 Unspecified abnormalities of gait and mobility: Secondary | ICD-10-CM | POA: Diagnosis not present

## 2015-06-24 DIAGNOSIS — M79642 Pain in left hand: Secondary | ICD-10-CM

## 2015-06-24 DIAGNOSIS — R270 Ataxia, unspecified: Secondary | ICD-10-CM | POA: Diagnosis not present

## 2015-06-24 DIAGNOSIS — M25642 Stiffness of left hand, not elsewhere classified: Secondary | ICD-10-CM

## 2015-06-24 DIAGNOSIS — R29898 Other symptoms and signs involving the musculoskeletal system: Secondary | ICD-10-CM

## 2015-06-24 DIAGNOSIS — R279 Unspecified lack of coordination: Secondary | ICD-10-CM

## 2015-06-24 DIAGNOSIS — M6289 Other specified disorders of muscle: Secondary | ICD-10-CM | POA: Diagnosis not present

## 2015-06-24 DIAGNOSIS — R293 Abnormal posture: Secondary | ICD-10-CM

## 2015-06-24 DIAGNOSIS — Z789 Other specified health status: Secondary | ICD-10-CM

## 2015-06-24 DIAGNOSIS — M6281 Muscle weakness (generalized): Secondary | ICD-10-CM

## 2015-06-24 DIAGNOSIS — Z7409 Other reduced mobility: Secondary | ICD-10-CM

## 2015-06-24 NOTE — Patient Instructions (Signed)
   MARCHING WITH WALKER - FWW MARCH  While standing with a walker, raise up one knee allowing it to bend as your raise it.   Be sure to have a chair behind you and under your buttocks for safety.  Repeat 10-15 times, 2x/day.    WALKER HIP ABDUCTION - FWW ABDUCTIONS  While standing up using a walker, raise your leg out to the side. Keep your knee straight and maintain your toes pointed forward the entire time.   Use your arms for support and balance and have a chair behind you for safety.  Repeat 10-15 times, 2x/day.    WALKER HIP EXTENSIONS - FWW EXTENSION  While standing up using a walker, extend your leg behind you.  Repeat 10-15 times, 2x/day.      Heel Raises  Rise up onto toes, then return. Repeat 10-15 times, 2x/day.   TOES RAISES - DORSIFLEXION STANDING  In a standing position with your feet on the ground, raise up your forefoot and toes as you bend at your ankle.     Repeat 10-15 times, 2x/day.

## 2015-06-24 NOTE — Therapy (Signed)
[place], [place], [address] Phone: [phone]   Fax:  [phone]  Physical Therapy Treatment (Discharge Assessment)  Patient Details  Name: [name] MRN: [id number] Date of Birth: Aug 21, 1966 Referring Provider:  Kathyrn Drown, MD  Encounter Date: 06/24/2015      PT End of Session - 06/24/15 1653    Visit Number 30   Number of Visits 30   Authorization Type Medicare    Authorization Time Period 04/06/15-06/06/15; G-code done 30th visit    Authorization - Visit Number 30   Authorization - Number of Visits 34   PT Start Time 4917   PT Stop Time 1432   PT Time Calculation (min) 44 min   Equipment Utilized During Treatment Gait belt   Activity Tolerance Patient tolerated treatment well   Behavior During Therapy [facility] for tasks assessed/performed      Past Medical History  Diagnosis Date  . Anxiety   . Depression   . COPD (chronic obstructive pulmonary disease)   . MS (multiple sclerosis)   . Fibromyalgia   . Impaired fasting glucose   . 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 Eating disorder   . Chronic low back pain   . PONV (postoperative nausea and vomiting)   . Chronic bronchitis     "yearly" (02/11/2013)  . Borderline diabetes   . GERD (gastroesophageal reflux disease)   . Migraine     "I use to" (02/11/2013)  . Arthritis     "hands & feet" (02/11/2013)  . Peripheral neuropathy     [name] 02/11/2013  . B12 deficiency anemia     [name] 02/11/2013  . Chronic pain syndrome     [name] 02/11/2013  . UTI (lower urinary tract infection) 02/11/2013    [name] 02/11/2013  . Chronic edema     BLE/notes 02/11/2013  . Ataxia   . Peripheral neuropathy   . Muscle weakness of lower extremity     bilateral  . Hypertension     Past Surgical History  Procedure Laterality Date  . Abdominal hysterectomy  ~ 1987; ~ 2004    "woodward; ferguson" (02/11/2013)  . Cesarean section  9150;  1984; 1986  . Anterior cervical decomp/discectomy fusion  2009; 2011  . Tonsillectomy  1990's?  . Cataract extraction w/phaco  06/20/2011    Procedure: CATARACT EXTRACTION PHACO AND INTRAOCULAR LENS PLACEMENT (IOC);  Surgeon: [name] T. Gershon Crane;  [facility]: [facility];  Service: Ophthalmology;  Laterality: Left;  CDE 1.81  . Cataract extraction w/phaco  07/04/2011    Procedure: CATARACT EXTRACTION PHACO AND INTRAOCULAR LENS PLACEMENT (IOC);  Surgeon: [name] T. Gershon Crane;  Location: [facility];  Service: Ophthalmology;  Laterality: Right;  CDE: 1.76  . Yag laser application Right 5/69/7948    Procedure: YAG LASER APPLICATION;  Surgeon: [name] T. Gershon Crane, MD;  Location: [facility];  Service: Ophthalmology;  Laterality: Right;  . Yag laser application Left 0/16/5537    Procedure: YAG LASER APPLICATION;  Surgeon: [name] T. Gershon Crane, MD;  Location: [facility];  Service: Ophthalmology;  Laterality: Left;  . Appendectomy    . Laparotomy N/A 09/27/2014    Procedure: Exploratory Laparotomy, Biopsy of Perforated Gastric Ulcer, Closure with omental patch;  Surgeon: [name], MD;  Location: [facility];  Service: General;  Laterality: N/A;  . Embolectomy Left 09/27/2014    Procedure: Left Brachial, Radial,Ulnar Embolectomy with patch angioplasty left brachial, radial and ulnar artery;  Surgeon: [name], MD;  [facility];  Service: Vascular;  Laterality: Left;  . Embolectomy Left 09/27/2014    Procedure: Left Radial, Brachial, and Ulnar Thrombectomy; Left Brachial to Radial Bypass Graft using Greater Saphenous vein graft from Left Thigh; Left Saphenous Vein Harvest; Intraoperative Arteriogram; Intra-arterial administration of TPA;  Surgeon: [name], MD;  Location: [facility] OR;  Service: Vascular;  Laterality: Left;  . Tracheostomy N/A december 2015    There were no vitals filed for this visit.  Visit Diagnosis:  Lack of coordination  Proximal muscle weakness  Ataxia  Impaired functional mobility and activity  tolerance  Abnormality of gait  Weakness of both legs  Poor posture      Subjective Assessment - 06/24/15 1351    Subjective Patient reports she has been doing well, thinking of getting power wheelchair like her MD suggested.    Pertinent History Patient reports that she became very ill in December of last year with a major blood clot in her arm, for which she required two surgeries. Patient states she remained ill for about 3 months; she also states that before she got sick at the end of the year, she was able to get up and walk, didn't need any help for transfers either. Patient reports that her MDs suspect that she may have MS but that she doesn't agree with them.    Currently in Pain? Yes   Pain Score 4    Pain Location Generalized  hands, feet, a little bit everywhere    Pain Orientation Right;Left            [facility] PT Assessment - 06/24/15 1404    AROM   Right Ankle Dorsiflexion 2   Left Ankle Dorsiflexion 5   Strength   Right Hip Flexion 3/5   Right Hip Extension 2/5   Right Hip ABduction 2/5   Left Hip Flexion 3/5   Left Hip Extension 3-/5   Left Hip ABduction 3-/5   Right Knee Flexion 4/5   Right Knee Extension 4/5   Left Knee Flexion 4/5   Left Knee Extension 4+/5   Right Ankle Dorsiflexion 4+/5   Left Ankle Dorsiflexion 4+/5   Transfers   Stand to Sit Details Min guard                              PT Education - 06/24/15 1652    Education provided Yes   Education Details Education provided regarding reasons for DC today, updated HEP    Person(s) Educated Patient   Methods Explanation;Demonstration;Handout   Comprehension Verbalized understanding          PT Short Term Goals - 06/24/15 1413    PT SHORT TERM GOAL #1   Title Patient will demonstrate a grade of at least 3+/5 in proximal musculature and core in order to promote functional stability and safe mobility skills    Time 6   Period Weeks   Status On-going   PT SHORT  TERM GOAL #2   Title Patient will be able to participate in standing tasks with Min(guard) from therapist  for at least 7 minutes at a time    Baseline 9/14- about 3 minute max    Time 6   Period Weeks   Status On-going   PT SHORT TERM GOAL #3   Title Patient to experience no more than 4/10 at worst on a daily basis at rest and during mobility    Baseline  9/15- 4/10 is as bad as it gets    Time 6   Period Weeks   Status Achieved   PT SHORT TERM GOAL #4   Title Patient will demonstrate the ability to ambulate at least 55f x6 in body weight support system with weighted extremities and minimal fatigue, good gait mechanics throughout    Time 6   Period Weeks   Status Deferred   PT SHORT TERM GOAL #5   Title Patient will be able to perform functional transfers, including sit<->stand and stand<->pivot from wheelchair to bed with close supervision and no loss of balance    Baseline 9/15- Min(A)    Time 6   Period Weeks   Status On-going   PT SHORT TERM GOAL #6   Title Patient will demonstrate improved posture while seated in wheelchair and will be able to state and demonstrate the importance of performing appropriate pressure relieving techniques consistently    Baseline 9/15- patient is efficient at weight shifting strategies, going very well overall    Time 6   Period Weeks   Status Achieved           PT Long Term Goals - 06/24/15 1415    PT LONG TERM GOAL #1   Title Patient will be independent in safely performed advanced HEP with assistance from caregivers as needed, updated PRN    Time 12   Period Weeks   Status Achieved   PT LONG TERM GOAL #2   Title Patient will demonstrate at least 4/5 to 4+/5 muscle strength in all proximal muscles, bilateral lower extremities, and core    Time 12   Period Weeks   Status On-going   PT LONG TERM GOAL #3   Title Patient will demonstrate bilateral ankle DF of at least 10 degrees in order to improve overall gait mechanics and functional  mobility skills    Baseline 9/15- up to 5 degrees DF today    Time 12   Period Weeks   Status On-going   PT LONG TERM GOAL #4   Title Patient will demonstrate the ability to ambulate at least 1028fwith LRAD, no more than distant supervision, no loss of balance, and minimal fatigue throughout    Baseline 9/15- still using walker but still somewhat unsteady    Time 12   Period Weeks   Status Partially Met   PT LONG TERM GOAL #5   Title Patient will be independent in performing functional transfers, including all bed mobility skills and sit<->stand and stand pivot transfers with Mod(I) and LRAD    Baseline 9/15- indepednent with bed mobilty while at PT    Time 12   Period Weeks   Status Partially Met   PT LONG TERM GOAL #6   Title Patient will demonstrate the ability to stand at least 15 minutes before fatiguing and with good posture and minimal loss of balance    Time 12   Period Weeks   Status On-going   PT LONG TERM GOAL #7   Title Patient will regularly perform weight bearing activities at home with assistance from caregivers as needed in order to protect skin and reduce detrimental effects of being sedentary    Baseline 8/16- patient reports this is going well at home    Time 12   Period Weeks   Status Achieved               Plan - 06/24/15 1654    Clinical Impression Statement Discharge assessment performed  today. While patient does show some improvement in bilateral ankle DF mobility, she does not show significant gains in funtional mobility, functional balance, functional strength, functional activity tolerance, and ddoes remain a fairly high fall risk. Patient was educated on reasons for DC today, including lack of significant progress, but did become somewhat agitated with PT regarding news of DC. Patient was provided with advanced HEP and was advised to remain as active as possible; also advised that if she gets power WC, she still needs to perform HEP and walk  regularly to maintain functional gaints. Ambulated approx 30f however patient was agitated and very impulsive and not safe, possibly due to her agitation over being DC-ed today; at one point did require Max(A) x1/Min(A)x1 to prevent fall. Patient is appropriate for DC at this time due to lack of progress.     Pt will benefit from skilled therapeutic intervention in order to improve on the following deficits Abnormal gait;Decreased coordination;Decreased range of motion;Difficulty walking;Impaired flexibility;Improper body mechanics;Decreased endurance;Decreased safety awareness;Impaired sensation;Postural dysfunction;Decreased activity tolerance;Decreased balance;Decreased knowledge of use of DME;Pain;Decreased mobility;Decreased strength   Rehab Potential Good   Clinical Impairments Affecting Rehab Potential chronic ataxia symptoms however patient has reportedly been able to return to walking after other bouts of illness    PT Next Visit Plan DC today    PT Home Exercise Plan updated today    Consulted and Agree with Plan of Care Patient          G-Codes - [id number]    Functional Assessment Tool Used FOTO not performed this session; Clinical judgment used to assess based on strength, functional mobility, balance, gait, functional activity tolerance    Functional Limitation Mobility: Walking and moving around   Mobility: Walking and Moving Around Goal Status [phone] At least 20 percent but less than 40 percent impaired, limited or restricted   Mobility: Walking and Moving Around Discharge Status [phone] At least 40 percent but less than 60 percent impaired, limited or restricted      Problem List Patient Active Problem List   Diagnosis Date Noted  . Nocturnal hypoxemia 06/17/2015  . Primary hypercoagulable state 03/19/2015  . Chronic respiratory failure 03/19/2015  . Insomnia 02/02/2015  . MRSA pneumonia 10/11/2014  . Abdominal abscess   . Candidemia   . Screen for STD (sexually  transmitted disease)   . 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 Perforated gastric ulcer 09/27/2014  . Prediabetes 07/23/2014  . Unspecified hereditary and idiopathic peripheral neuropathy 04/15/2014  . CVA (cerebral infarction) 04/02/2014  . Hypertriglyceridemia 12/11/2013  . Stiffness of joint, not elsewhere classified, ankle and foot 11/12/2013  . Lack of coordination 11/03/2013  . Pernicious anemia 09/12/2013  . Elevated transaminase level 09/12/2013  . Ataxia 09/09/2013  . Difficulty in walking(719.7) 09/09/2013  . Weakness of both legs 09/09/2013  . Generalized weakness 02/11/2013  . Tobacco abuse 01/14/2013  . Pedal edema 01/13/2013  . Chronic pain syndrome 12/30/2012  . Reactive airway disease 12/30/2012    PHYSICAL THERAPY DISCHARGE SUMMARY  Visits from Start of Care: 30  Current functional level related to goals / functional outcomes: Patient has improved as compared to her state at initial evaluation, however does continue to be limited in gait, functional mobility, balance, safety, functional activity toelrance at this time.    Remaining deficits: Gait and posture impairment, functional mobility, balance, safety awareness, functional activity tolerance, remains high fall risk    Education / Equipment: Updated HEP, advised to remain active even with possible power  WC, reasoning for DC at this point  Plan: Patient agrees to discharge.  Patient goals were partially met. Patient is being discharged due to lack of progress.  ?????       Deniece Ree PT, DPT [facility] [address] [place], [place], [address] Phone: [phone]   Fax:  [phone]

## 2015-06-24 NOTE — Therapy (Signed)
[place], [place], [address] Phone: [phone]   Fax:  [phone]  Occupational Therapy Treatment  Patient Details  Name: [name] MRN: [id number] Date of Birth: 1966/01/04 Referring Provider:  Kathyrn Drown, MD  Encounter Date: 06/24/2015      OT End of Session - 06/24/15 1635    Visit Number 23   Number of Visits 24   Date for OT Re-Evaluation 07/10/15   Authorization Type Medicare A   Authorization Time Period before 32nd visit   Authorization - Visit Number 23   Authorization - Number of Visits 26   OT Start Time 1432   OT Stop Time 1516   OT Time Calculation (min) 44 min   Activity Tolerance Patient tolerated treatment well   Behavior During Therapy [facility] for tasks assessed/performed      Past Medical History  Diagnosis Date  . Anxiety   . Depression   . COPD (chronic obstructive pulmonary disease)   . MS (multiple sclerosis)   . Fibromyalgia   . Impaired fasting glucose   . 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 Eating disorder   . Chronic low back pain   . PONV (postoperative nausea and vomiting)   . Chronic bronchitis     "yearly" (02/11/2013)  . Borderline diabetes   . GERD (gastroesophageal reflux disease)   . Migraine     "I use to" (02/11/2013)  . Arthritis     "hands & feet" (02/11/2013)  . Peripheral neuropathy     [name] 02/11/2013  . B12 deficiency anemia     [name] 02/11/2013  . Chronic pain syndrome     [name] 02/11/2013  . UTI (lower urinary tract infection) 02/11/2013    [name] 02/11/2013  . Chronic edema     BLE/notes 02/11/2013  . Ataxia   . Peripheral neuropathy   . Muscle weakness of lower extremity     bilateral  . Hypertension     Past Surgical History  Procedure Laterality Date  . Abdominal hysterectomy  ~ 1987; ~ 2004    "woodward; ferguson" (02/11/2013)  . Cesarean section  1749; 1984; 1986  . Anterior cervical decomp/discectomy  fusion  2009; 2011  . Tonsillectomy  1990's?  . Cataract extraction w/phaco  06/20/2011    Procedure: CATARACT EXTRACTION PHACO AND INTRAOCULAR LENS PLACEMENT (IOC);  Surgeon: [name] T. Gershon Crane;  Location: [facility];  Service: Ophthalmology;  Laterality: Left;  CDE 1.81  . Cataract extraction w/phaco  07/04/2011    Procedure: CATARACT EXTRACTION PHACO AND INTRAOCULAR LENS PLACEMENT (IOC);  Surgeon: [name] T. Gershon Crane;  Location: [facility];  Service: Ophthalmology;  Laterality: Right;  CDE: 1.76  . Yag laser application Right 4/49/6759    Procedure: YAG LASER APPLICATION;  Surgeon: [name] T. Gershon Crane, MD;  Location: [facility];  Service: Ophthalmology;  Laterality: Right;  . Yag laser application Left 1/63/8466    Procedure: YAG LASER APPLICATION;  Surgeon: [name] T. Gershon Crane, MD;  Location: [facility];  Service: Ophthalmology;  Laterality: Left;  . Appendectomy    . Laparotomy N/A 09/27/2014    Procedure: Exploratory Laparotomy, Biopsy of Perforated Gastric Ulcer, Closure with omental patch;  Surgeon: [name], MD;  Location: [facility];  Service: General;  Laterality: N/A;  . Embolectomy Left 09/27/2014    Procedure: Left Brachial, Radial,Ulnar Embolectomy with patch angioplasty left brachial, radial and ulnar artery;  Surgeon: [name], MD;  Location: [facility];  Service: Vascular;  Laterality: Left;  . Embolectomy Left 09/27/2014    Procedure: Left Radial, Brachial, and Ulnar Thrombectomy; Left Brachial to Radial Bypass Graft using Greater Saphenous vein graft from Left Thigh; Left Saphenous Vein Harvest; Intraoperative Arteriogram; Intra-arterial administration of TPA;  Surgeon: [name], MD;  Location: [facility] OR;  Service: Vascular;  Laterality: Left;  . Tracheostomy N/A december 2015    There were no vitals filed for this visit.  Visit Diagnosis:  Lack of coordination  Hand joint pain, left  Stiffness of hand joint, left  Decreased activities of daily living (ADL)      Subjective Assessment -  06/24/15 1626    Subjective  S: They're kicking me out of here.    Currently in Pain? Yes   Pain Score 4    Pain Location Generalized   Pain Orientation Left;Right   Pain Descriptors / Indicators Aching;Sore   Pain Type Chronic pain            [facility] OT Assessment - 06/24/15 1625    Assessment   Diagnosis left hand spaticity   Precautions   Precautions Fall                  OT Treatments/Exercises (OP) - 06/24/15 1626    Transfers   Transfers Sit to Stand   Sit to Stand 4: Min assist;With upper extremity assist;With armrests   ADLs   LB Dressing Pt completed LB dressing using adaptive equipment including a reacher and sock aid. Pt used reacher to grasp waistband of pants and pull over feet and up to knees. Pt then pulled pant up to thighs using both hands. Pt had max difficulty using reacher, requiring mod assist and max verbal cuing to manipulate reacher and pants. Pt also used sock aid to donn socks this session. Pt had min/mod difficulty placing sock onto sock aid, and min difficulty pulling onto foot. Verbal cuing required for technique from OT.    Functional Mobility Pt transferred from armchair to wheelchair this session using 2 wheeled walker, with mod assist from OT. Mod verbal cuing required to slow down and use walker correctly.    Exercises   Exercises Hand   Hand Exercises   Other Hand Exercises Pt practiced using reacher to pick up various objects from floor this session, in preparation for using reacher to donn LB clothing. Pt grasped cones and small cookie cutters from floor in various positions around her wheelchair. Pt had min difficulty using reacher once accustomed to using the trigger grasp and release mechanism.                   OT Short Term Goals - 05/25/15 1649    OT SHORT TERM GOAL #1   Title Patient will be educated and independent with HEP.   Status On-going   OT SHORT TERM GOAL #2   Title Patient will increase AROM of left hand  by 5 degrees to increase ability to use left hand during daily tasks.    OT SHORT TERM GOAL #3   Title Therapist will fabricate finger extension splint to defer the chance of progressing spaticity.    OT SHORT TERM GOAL #4   Title Patient will increase coordination in Bil hands by decreasing completion time of  9 hole peg test by 10 seconds.    OT SHORT TERM GOAL #5   Title Patient will increase grip strength by 5# and pinch strength by 3#  of left hand to increase ability  to use left hand as an active assist during daily tasks.            OT Long Term Goals - 06/22/15 1538    OT LONG TERM GOAL #1   Title Patient will return to highest level of independence with all daily activities.    Status On-going   OT LONG TERM GOAL #2   Title Patient will increase AROM of left hand by 10 degrees.   Status Partially Met   OT LONG TERM GOAL #3   Title Patient will increase PROM of left hand to [facility] to decrease the progression of spaticity   Status Achieved   OT LONG TERM GOAL #4   Title Patient will increase grip strength by 10# and pinch strength by 5# in LUE.   Status On-going   OT LONG TERM GOAL #5   Title Patient will increase Bil coordination by decreasing completion time of 9 hole peg test by 30 seconds.    Status Achieved               Plan - 06/24/15 1635    Clinical Impression Statement A: Pt participated in LB dressing practice using reacher and sock aid this session. Pt had max difficulty using reacher and min difficulty with sock aid. Pt exhibitis minimal interest in learning about LB dressing techniques, however is interested in obtaining a reacher for home use. Pt is upset about discharging from therapy, however understands that she is required to continually demonstrate progress in order to remain in therapy. Pt was asked what else she would like to work on in therapy and stated "nothing I can think of."    Plan P: Pt is scheduled for power wheelchair evaluation next  session.         Problem List Patient Active Problem List   Diagnosis Date Noted  . Nocturnal hypoxemia 06/17/2015  . Primary hypercoagulable state 03/19/2015  . Chronic respiratory failure 03/19/2015  . Insomnia 02/02/2015  . MRSA pneumonia 10/11/2014  . Abdominal abscess   . Candidemia   . Screen for STD (sexually transmitted disease)   . 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 Perforated gastric ulcer 09/27/2014  . Prediabetes 07/23/2014  . Unspecified hereditary and idiopathic peripheral neuropathy 04/15/2014  . CVA (cerebral infarction) 04/02/2014  . Hypertriglyceridemia 12/11/2013  . Stiffness of joint, not elsewhere classified, ankle and foot 11/12/2013  . Lack of coordination 11/03/2013  . Pernicious anemia 09/12/2013  . Elevated transaminase level 09/12/2013  . Ataxia 09/09/2013  . Difficulty in walking(719.7) 09/09/2013  . Weakness of both legs 09/09/2013  . Generalized weakness 02/11/2013  . Tobacco abuse 01/14/2013  . Pedal edema 01/13/2013  . Chronic pain syndrome 12/30/2012  . Reactive airway disease 12/30/2012    Guadelupe Sabin, OTR/L  [phone]  06/24/2015, 4:39 PM  [place] [address] [place], [place], [address] Phone: [phone]   Fax:  [phone]

## 2015-06-29 ENCOUNTER — Ambulatory Visit (HOSPITAL_COMMUNITY): Payer: BLUE CROSS/BLUE SHIELD

## 2015-06-29 ENCOUNTER — Encounter (HOSPITAL_COMMUNITY): Payer: Self-pay

## 2015-06-29 DIAGNOSIS — Z7409 Other reduced mobility: Secondary | ICD-10-CM | POA: Diagnosis not present

## 2015-06-29 DIAGNOSIS — R279 Unspecified lack of coordination: Secondary | ICD-10-CM | POA: Diagnosis not present

## 2015-06-29 DIAGNOSIS — R29898 Other symptoms and signs involving the musculoskeletal system: Secondary | ICD-10-CM | POA: Diagnosis not present

## 2015-06-29 DIAGNOSIS — R269 Unspecified abnormalities of gait and mobility: Secondary | ICD-10-CM | POA: Diagnosis not present

## 2015-06-29 DIAGNOSIS — Z789 Other specified health status: Secondary | ICD-10-CM

## 2015-06-29 DIAGNOSIS — M6289 Other specified disorders of muscle: Secondary | ICD-10-CM | POA: Diagnosis not present

## 2015-06-29 DIAGNOSIS — R270 Ataxia, unspecified: Secondary | ICD-10-CM | POA: Diagnosis not present

## 2015-06-29 NOTE — Therapy (Signed)
[place], [place], [address] Phone: [phone]   Fax:  [phone]  Occupational Therapy Wheelchair Evaluation  Patient Details  Name: [name] MRN: [id number] Date of Birth: 05/08/1966 Referring Provider:  Kathyrn Drown, MD  Encounter Date: 06/29/2015      OT End of Session - 06/29/15 1828    Visit Number 23   Number of Visits 25   Date for OT Re-Evaluation 07/10/15   Authorization Type Medicare A   Authorization Time Period before 32nd visit   Authorization - Visit Number 23   Authorization - Number of Visits 32   OT Start Time 1430   OT Stop Time 1515   OT Time Calculation (min) 45 min   Activity Tolerance Patient tolerated treatment well   Behavior During Therapy [facility] for tasks assessed/performed      Past Medical History  Diagnosis Date  . Anxiety   . Depression   . COPD (chronic obstructive pulmonary disease)   . MS (multiple sclerosis)   . Fibromyalgia   . Impaired fasting glucose   . 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 Eating disorder   . Chronic low back pain   . PONV (postoperative nausea and vomiting)   . Chronic bronchitis     "yearly" (02/11/2013)  . Borderline diabetes   . GERD (gastroesophageal reflux disease)   . Migraine     "I use to" (02/11/2013)  . Arthritis     "hands & feet" (02/11/2013)  . Peripheral neuropathy     [name] 02/11/2013  . B12 deficiency anemia     [name] 02/11/2013  . Chronic pain syndrome     [name] 02/11/2013  . UTI (lower urinary tract infection) 02/11/2013    [name] 02/11/2013  . Chronic edema     BLE/notes 02/11/2013  . Ataxia   . Peripheral neuropathy   . Muscle weakness of lower extremity     bilateral  . Hypertension     Past Surgical History  Procedure Laterality Date  . Abdominal hysterectomy  ~ 1987; ~ 2004    "woodward; ferguson" (02/11/2013)  . Cesarean section  6314; 1984; 1986  . Anterior cervical  decomp/discectomy fusion  2009; 2011  . Tonsillectomy  1990's?  . Cataract extraction w/phaco  06/20/2011    Procedure: CATARACT EXTRACTION PHACO AND INTRAOCULAR LENS PLACEMENT (IOC);  Surgeon: [name] T. Gershon Crane;  [facility]: [facility];  Service: Ophthalmology;  Laterality: Left;  CDE 1.81  . Cataract extraction w/phaco  07/04/2011    Procedure: CATARACT EXTRACTION PHACO AND INTRAOCULAR LENS PLACEMENT (IOC);  Surgeon: [name] T. Gershon Crane;  Location: [facility];  Service: Ophthalmology;  Laterality: Right;  CDE: 1.76  . Yag laser application Right 9/70/2637    Procedure: YAG LASER APPLICATION;  Surgeon: [name] T. Gershon Crane, MD;  Location: [facility];  Service: Ophthalmology;  Laterality: Right;  . Yag laser application Left 8/58/8502    Procedure: YAG LASER APPLICATION;  Surgeon: [name] T. Gershon Crane, MD;  Location[facility];  Service: Ophthalmology;  Laterality: Left;  . Appendectomy    . Laparotomy N/A 09/27/2014    Procedure: Exploratory Laparotomy, Biopsy of Perforated Gastric Ulcer, Closure with omental patch;  Surgeon: [name], MD;  Location: [place];  Service: General;  Laterality: N/A;  . Embolectomy Left 09/27/2014    Procedure: Left Brachial, Radial,Ulnar Embolectomy with patch angioplasty left brachial, radial and ulnar artery;  Surgeon: [name], MD;  Location: [place];  Service:  Vascular;  Laterality: Left;  . Embolectomy Left 09/27/2014    Procedure: Left Radial, Brachial, and Ulnar Thrombectomy; Left Brachial to Radial Bypass Graft using Greater Saphenous vein graft from Left Thigh; Left Saphenous Vein Harvest; Intraoperative Arteriogram; Intra-arterial administration of TPA;  Surgeon: [name], MD;  Location: [facility] OR;  Service: Vascular;  Laterality: Left;  . Tracheostomy N/A december 2015    There were no vitals filed for this visit.  Visit Diagnosis:  Decreased activities of daily living (ADL)      Subjective Assessment - 06/29/15 1825    Subjective  --   Currently in Pain? Yes   Pain  Score 5    Pain Location Generalized   Pain Descriptors / Indicators Aching;Sore   Pain Type Chronic pain   Pain Onset More than a month ago        Date: 9/20//16 Patient Name: [name] Address: [address], [place], [place] [address] DOB: 1966-02-04   To Whom It May Concern,    [name] was seen today in this clinic for a power wheelchair evaluation. [name] has a past medical history that includes an unknown neurologic condition due to B12 deficiency and nerve dysfunction, Chronic pain with sciatica in bilateral legs, Chronic hand and neck pain, fibromyalgia, anxiety, depression, COPS, MS, arthritis, peripheral neuropathy, chronic pain syndrome, ataxia, and HTN. [name] has a past surgical history that includes embolectomy, tracheotomy and neck surgery. [name] reports that in December 2016 she became ill and developed a blood clot in her left arm requiring her to have 2 surgeries. [name] reports that after waking up her 3rd, 4th, and 5th metacarpals were in a flexed claw like state. [name] states that her Doctor's do not know what has caused her to physically decline. [name] was independent with all daily tasks prior to becoming ill. Now she requires 24 hour care for all daily tasks.  [name] lives in a double wide modular home with a ramped entrance. [name] receives assistance from a paid family friend daily, 6 days a week for BADL tasks such as functional transfers, dressing, grooming, and toileting tasks. During the evenings and when Cherrise's friend leaves for the night, her significant other assists her with daily tasks. [name] is able to complete bathing and UB dressing tasks independently. [name] is unable to complete simple meal prep tasks due to increased pan in bilateral legs and feet when standing for an extended amount of time.     A FULL PHYSICAL ASSESSMENT REVEALS THE FOLLOWING    Existing Equipment:   Tammy Desouza owns a  manual wheelchair, 2 wheeled walker, 4 wheeled walker, bedside commode, shower chair with back, and grab bars (toilet and shower).   Transfers:  Tammy Stierwalt is requires moderate assist for transfers using walker from low surfaces. From an elevated surface, Tammy Romberger requires min assist for transfers using walker.   Head and Neck:    AROM WFL   Trunk and Pelvis: AROM WFL  Hip AROM WFL MMT: 4/5  Knees:  AROM WFL MMT: 4/5  Feet and Ankles: AROM WFL  MMT: 4-/5  Upper Extremities: AROM of bilateral shoulders, elbows, and wrist is WFL. Right shoulder MMT: 4+/5 (flexion, horizontal abduction, external and internal rotation). Left shoulder MMT: 4+/5 (flexion, horizontal abduction, external and internal rotation).  Right hand grip strength: 40lbs. Lateral pinch strength: 12lbs. 3 point pinch strength: 12lbs. Left grip strength: 20lbs. lateral pinch strength: 5lbs. 3 point pinch: 6lbs.  Left hand finger composite finger flexion and extension: 75%. Patient presents with decreased A/ROM and P/ROM of left hand due to spasticity.   Weight Shifting Ability:  Tammy. Primm is independent for weight shifting ability.  Skin Integrity:  No know history of skin breakdown.  Cognition:  WFL  Activity Tolerance: Poor. Tammy Calles spends the majority of her time in the wheelchair or seated in her recliner at home.  Tammy Granholm states that she is able to walk with her walker at home for 3 minutes before the pain in her bilateral becomes unbearable and she is required to sit. Tammy Runquist states that her pain level in bilateral feet and hands remains constant at a 5/10. When walking this pain level increasing to 9 or 10/10.  GOALS/OBJECTIVE OF SEATING INTERVENTION:  Recommendation: Tammy Chirico would benefit from a new power wheelchair for use in her home and in the community. She is unable to propel a standard wheelchair due to decreased BUE hand strength and left hand ROM. A scooter would not be beneficial for  Tammy Capes. She does not have the adequate space needed in her home for the wide turning radius required by the scooter.  Tammy. Darsey is motivated and willing to use her power wheelchair and is physically and mentally able to operate a power wheelchair. A power wheelchair would increase Tammy Tolen's safety and independence with daily activities and her quality of life. Tammy Flatley wishes to receive a powered wheelchair to increase her ability to complete simple meal prep tasks and to become more mobile at home and in her community.  If you require any further information concerning Tammy Major's positioning, independence or mobility needs; or any further information why a lesser device will not work, please do not hesitate to contact me at [place], [address] [place] [place], [place] [address] [phone].          Plan - 06/29/15 1828    Consulted and Agree with Plan of Care Patient        Problem List Patient Active Problem List   Diagnosis Date Noted  . Nocturnal hypoxemia 06/17/2015  . Primary hypercoagulable state 03/19/2015  . Chronic respiratory failure 03/19/2015  . Insomnia 02/02/2015  . MRSA pneumonia 10/11/2014  . Abdominal abscess   . Candidemia   . Screen for STD (sexually transmitted disease)   . 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 Perforated gastric ulcer 09/27/2014  . Prediabetes 07/23/2014  . Unspecified hereditary and idiopathic peripheral neuropathy 04/15/2014  . CVA (cerebral infarction) 04/02/2014  . Hypertriglyceridemia 12/11/2013  . Stiffness of joint, not elsewhere classified, ankle and foot 11/12/2013  . Lack of coordination 11/03/2013  . Pernicious anemia 09/12/2013  . Elevated transaminase level 09/12/2013  . Ataxia 09/09/2013  . Difficulty in walking(719.7) 09/09/2013  . Weakness of both legs 09/09/2013  . Generalized weakness 02/11/2013  . Tobacco abuse 01/14/2013  . Pedal edema 01/13/2013  . Chronic pain  syndrome 12/30/2012  . Reactive airway disease 12/30/2012    Ailene Ravel, OTR/L,CBIS  [phone]  06/29/2015, 6:29 PM  [place] [address] [place], [place], [address] Phone: [phone]   Fax:  [phone]

## 2015-06-30 ENCOUNTER — Telehealth: Payer: Self-pay | Admitting: Family Medicine

## 2015-06-30 NOTE — Telephone Encounter (Signed)
a prescription for her wheelchair was written. Also printed out copy of her occupational therapy wheelchair evaluation was printed. Please put this with her last office visit with me in September. Forward this to [employer]. Please inform patient either by phone or by note that this was completed. Thank you

## 2015-07-01 ENCOUNTER — Ambulatory Visit (HOSPITAL_COMMUNITY): Payer: BLUE CROSS/BLUE SHIELD | Admitting: Occupational Therapy

## 2015-07-01 ENCOUNTER — Encounter (HOSPITAL_COMMUNITY): Payer: Self-pay | Admitting: Physical Therapy

## 2015-07-01 ENCOUNTER — Telehealth: Payer: Self-pay | Admitting: Family Medicine

## 2015-07-01 ENCOUNTER — Encounter (HOSPITAL_COMMUNITY): Payer: Self-pay | Admitting: Occupational Therapy

## 2015-07-01 DIAGNOSIS — R279 Unspecified lack of coordination: Secondary | ICD-10-CM | POA: Diagnosis not present

## 2015-07-01 DIAGNOSIS — R270 Ataxia, unspecified: Secondary | ICD-10-CM | POA: Diagnosis not present

## 2015-07-01 DIAGNOSIS — R29898 Other symptoms and signs involving the musculoskeletal system: Secondary | ICD-10-CM | POA: Diagnosis not present

## 2015-07-01 DIAGNOSIS — Z7409 Other reduced mobility: Secondary | ICD-10-CM | POA: Diagnosis not present

## 2015-07-01 DIAGNOSIS — M6289 Other specified disorders of muscle: Secondary | ICD-10-CM | POA: Diagnosis not present

## 2015-07-01 DIAGNOSIS — R269 Unspecified abnormalities of gait and mobility: Secondary | ICD-10-CM | POA: Diagnosis not present

## 2015-07-01 DIAGNOSIS — Z789 Other specified health status: Secondary | ICD-10-CM

## 2015-07-01 NOTE — Telephone Encounter (Signed)
Patient said that someone called her the other day about a scooter.  She wants to know does she need to be seen for this?  She is being discharged today from the hospital.

## 2015-07-01 NOTE — Therapy (Signed)
[place], [place], [address] Phone: [phone]   Fax:  [phone]  Occupational Therapy Treatment & Discharge Summary  Patient Details  Name: [name] MRN: [id number] Date of Birth: Mar 07, 1966 Referring Provider:  Kathyrn Drown, MD  Encounter Date: 07/01/2015      OT End of Session - 07/01/15 1627    Visit Number 24   Number of Visits 25   Date for OT Re-Evaluation 07/10/15   Authorization Type Medicare A   Authorization Time Period before 32nd visit   Authorization - Visit Number 24   Authorization - Number of Visits 32   OT Start Time 1435   OT Stop Time 1516   OT Time Calculation (min) 41 min   Activity Tolerance Patient tolerated treatment well   Behavior During Therapy [facility] for tasks assessed/performed      Past Medical History  Diagnosis Date  . Anxiety   . Depression   . COPD (chronic obstructive pulmonary disease)   . MS (multiple sclerosis)   . Fibromyalgia   . Impaired fasting glucose   . 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 Eating disorder   . Chronic low back pain   . PONV (postoperative nausea and vomiting)   . Chronic bronchitis     "yearly" (02/11/2013)  . Borderline diabetes   . GERD (gastroesophageal reflux disease)   . Migraine     "I use to" (02/11/2013)  . Arthritis     "hands & feet" (02/11/2013)  . Peripheral neuropathy     [name] 02/11/2013  . B12 deficiency anemia     [name] 02/11/2013  . Chronic pain syndrome     [name] 02/11/2013  . UTI (lower urinary tract infection) 02/11/2013    [name] 02/11/2013  . Chronic edema     BLE/notes 02/11/2013  . Ataxia   . Peripheral neuropathy   . Muscle weakness of lower extremity     bilateral  . Hypertension     Past Surgical History  Procedure Laterality Date  . Abdominal hysterectomy  ~ 1987; ~ 2004    "woodward; ferguson" (02/11/2013)  . Cesarean section  9024; 1984; 1986  . Anterior cervical  decomp/discectomy fusion  2009; 2011  . Tonsillectomy  1990's?  . Cataract extraction w/phaco  06/20/2011    Procedure: CATARACT EXTRACTION PHACO AND INTRAOCULAR LENS PLACEMENT (IOC);  Surgeon: [name] T. Gershon Crane;  [facility];  Service: Ophthalmology;  Laterality: Left;  CDE 1.81  . Cataract extraction w/phaco  07/04/2011    Procedure: CATARACT EXTRACTION PHACO AND INTRAOCULAR LENS PLACEMENT (IOC);  Surgeon: [name] T. Gershon Crane;  [facility]: [facility];  Service: Ophthalmology;  Laterality: Right;  CDE: 1.76  . Yag laser application Right 0/97/3532    Procedure: YAG LASER APPLICATION;  Surgeon: [name] T. Gershon Crane, MD;  [facility]: [facility];  Service: Ophthalmology;  Laterality: Right;  . Yag laser application Left 9/92/4268    Procedure: YAG LASER APPLICATION;  Surgeon: [name] T. Gershon Crane, MD;  [facility]: [facility];  Service: Ophthalmology;  Laterality: Left;  . Appendectomy    . Laparotomy N/A 09/27/2014    Procedure: Exploratory Laparotomy, Biopsy of Perforated Gastric Ulcer, Closure with omental patch;  Surgeon: [name], MD;  Location: [place];  Service: General;  Laterality: N/A;  . Embolectomy Left 09/27/2014    Procedure: Left Brachial, Radial,Ulnar Embolectomy with patch angioplasty left brachial, radial and ulnar artery;  Surgeon: [name], MD;  Location: [place];  Service: Vascular;  Laterality: Left;  . Embolectomy Left 09/27/2014    Procedure: Left Radial, Brachial, and Ulnar Thrombectomy; Left Brachial to Radial Bypass Graft using Greater Saphenous vein graft from Left Thigh; Left Saphenous Vein Harvest; Intraoperative Arteriogram; Intra-arterial administration of TPA;  Surgeon: [name], MD;  Location: [facility] OR;  Service: Vascular;  Laterality: Left;  . Tracheostomy N/A december 2015    There were no vitals filed for this visit.  Visit Diagnosis:  Decreased activities of daily living (ADL)  Lack of coordination      Subjective Assessment - 07/01/15 1623    Subjective  S: Sunday is my  birthday.   Currently in Pain? No/denies            [facility] OT Assessment - 07/01/15 1622    Assessment   Diagnosis left hand spaticity   Precautions   Precautions Fall                  OT Treatments/Exercises (OP) - 07/01/15 1623    Exercises   Exercises Hand   Hand Exercises   Other Hand Exercises Pt practiced using reacher to pick up various objects from floor, cabinets, and shelves at various heights this session. Pt also practiced using reacher to open and close kitchen cabinet doors. Pt grasped blocks, food containers, cones, and small cookie cutters from various heights. Pt had min difficulty using reacher, and was educated on using the reacher to grab as well as pull items closer to her.    Fine Motor Coordination   Other Fine Motor Exercises Pt completed timed perfection game, using right hand to place pieces and left hand to remove. Pt required extended time, and was able to complete game in 7 minutes using both hands.                   OT Short Term Goals - 07/01/15 1629    OT SHORT TERM GOAL #1   Title Patient will be educated and independent with HEP.   Status Achieved   OT SHORT TERM GOAL #2   Title Patient will increase AROM of left hand by 5 degrees to increase ability to use left hand during daily tasks.    OT SHORT TERM GOAL #3   Title Therapist will fabricate finger extension splint to defer the chance of progressing spaticity.    OT SHORT TERM GOAL #4   Title Patient will increase coordination in Bil hands by decreasing completion time of  9 hole peg test by 10 seconds.    OT SHORT TERM GOAL #5   Title Patient will increase grip strength by 5# and pinch strength by 3#  of left hand to increase ability to use left hand as an active assist during daily tasks.            OT Long Term Goals - 07/01/15 1629    OT LONG TERM GOAL #1   Title Patient will return to highest level of independence with all daily activities.    Status Not Met    OT LONG TERM GOAL #2   Title Patient will increase AROM of left hand by 10 degrees.   Status Partially Met   OT LONG TERM GOAL #3   Title Patient will increase PROM of left hand to [facility] to decrease the progression of spaticity   Status Achieved   OT LONG TERM GOAL #4   Title Patient will increase grip strength by 10# and pinch strength by  5# in LUE.   Status Not Met   OT LONG TERM GOAL #5   Title Patient will increase Bil coordination by decreasing completion time of 9 hole peg test by 30 seconds.    Status Achieved               Plan - 07-08-15 1627    Clinical Impression Statement A: Pt participated in practicing reaching for various sized items using a reacher. Items were placed at varying heights and required pt to open cabinets doors to find items. Pt had min difficulty and was able to grasp items, pull items, and open doors with reacher. Pt had no questions or concerns about therapy and is ready for discharge today.    Plan P: Discharge pt.           G-Codes - 07/08/15 1630    Functional Assessment Tool Used Clinical judgement   Functional Limitation Carrying, moving and handling objects   Carrying, Moving and Handling Objects Goal Status [phone]) At least 20 percent but less than 40 percent impaired, limited or restricted   Carrying, Moving and Handling Objects Discharge Status [phone]) At least 40 percent but less than 60 percent impaired, limited or restricted      Problem List Patient Active Problem List   Diagnosis Date Noted  . Nocturnal hypoxemia 06/17/2015  . Primary hypercoagulable state 03/19/2015  . Chronic respiratory failure 03/19/2015  . Insomnia 02/02/2015  . MRSA pneumonia 10/11/2014  . Abdominal abscess   . Candidemia   . Screen for STD (sexually transmitted disease)   . 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 Perforated gastric ulcer 09/27/2014  . Prediabetes 07/23/2014  . Unspecified hereditary and idiopathic peripheral neuropathy 04/15/2014   . CVA (cerebral infarction) 04/02/2014  . Hypertriglyceridemia 12/11/2013  . Stiffness of joint, not elsewhere classified, ankle and foot 11/12/2013  . Lack of coordination 11/03/2013  . Pernicious anemia 09/12/2013  . Elevated transaminase level 09/12/2013  . Ataxia 09/09/2013  . Difficulty in walking(719.7) 09/09/2013  . Weakness of both legs 09/09/2013  . Generalized weakness 02/11/2013  . Tobacco abuse 01/14/2013  . Pedal edema 01/13/2013  . Chronic pain syndrome 12/30/2012  . Reactive airway disease 12/30/2012    Guadelupe Sabin, OTR/L  [phone]  07-08-2015, 4:31 PM  [place] [address] [place], [place], [address] Phone: [phone]   Fax:  [phone]  OCCUPATIONAL THERAPY DISCHARGE SUMMARY  Visits from Start of Care: 24  Current functional level related to goals / functional outcomes: See above goals. Pt is able to use the left hand as an assist during BUE tasks and has increased her strength and fine motor coordination in the left hand.    Remaining deficits: Pt continues to have difficulty with fine motor coordination tasks involving left hand and digits due to ongoing spasticity in the left hand. Pt also demonstrates decreased grip and pinch strength in the left hand.   Education / Equipment: Educated pt as to where she can purchase AE including a reacher, elastic shoelaces, and a long-handled sponge.   Plan: Patient agrees to discharge.  Patient goals were partially met. Patient is being discharged due to lack of progress.  ?????

## 2015-07-01 NOTE — Telephone Encounter (Signed)
Please let the patient know that I printed off information that occupational therapy completed, I also printed off her last visit here which was considered face-to-face for scooter evaluation and I also wrote a detailed prescription for scooter. All of this was sent to [facility]. She should talk with them. If having further troubles call us back thank you

## 2015-07-02 NOTE — Telephone Encounter (Signed)
Notified patient.

## 2015-07-06 ENCOUNTER — Encounter (HOSPITAL_COMMUNITY): Payer: Self-pay | Admitting: Physical Therapy

## 2015-07-06 ENCOUNTER — Encounter (HOSPITAL_COMMUNITY): Payer: Self-pay | Admitting: Occupational Therapy

## 2015-07-08 ENCOUNTER — Encounter (HOSPITAL_COMMUNITY): Payer: Self-pay

## 2015-07-12 ENCOUNTER — Other Ambulatory Visit: Payer: Self-pay | Admitting: Family Medicine

## 2015-07-14 ENCOUNTER — Telehealth: Payer: Self-pay | Admitting: Family Medicine

## 2015-07-14 NOTE — Telephone Encounter (Signed)
Patients spouse called today regarding the order for the wheelchair for Ms. Dunshee.  He said that [place] never received anything, so I found the order that we did for her.  We had received a referral form through [facility] and submitted them the information for the wheelchair.  [name] believes that they were going to send something back to [place] to complete the process.  Can someone please call him and clarify with him on how this works?  I am unsure of the next step.  I do have the information we faxed over to [facility] if needed.

## 2015-07-14 NOTE — Telephone Encounter (Signed)
Faxed order and notes. [place] [facility] to call if they need any additional info.

## 2015-07-14 NOTE — Telephone Encounter (Signed)
Called [name]. They never received order that was faxed. They want order faxed to [phone] so they can start process.

## 2015-07-15 NOTE — Telephone Encounter (Signed)
Mark notified.

## 2015-07-15 NOTE — Telephone Encounter (Signed)
Called [name] to make sure they received the wheelchair rx and notes. They confirmed they did receive. They will contact pt when wheelchair is ready for pickup.

## 2015-07-21 NOTE — Addendum Note (Signed)
Encounter addended by: Leeroy Cha, PT on: 07/21/2015  9:54 AM<BR>     Documentation filed: Clinical Notes

## 2015-08-09 ENCOUNTER — Other Ambulatory Visit: Payer: Self-pay | Admitting: Family Medicine

## 2015-08-18 NOTE — Telephone Encounter (Signed)
Called concerning apt date and time  Casey Cockerham, LPTA; CBIS [phone]  

## 2015-09-09 ENCOUNTER — Ambulatory Visit (INDEPENDENT_AMBULATORY_CARE_PROVIDER_SITE_OTHER): Payer: Medicare Other | Admitting: Family Medicine

## 2015-09-09 ENCOUNTER — Other Ambulatory Visit: Payer: Self-pay | Admitting: Family Medicine

## 2015-09-09 ENCOUNTER — Encounter: Payer: Self-pay | Admitting: Family Medicine

## 2015-09-09 VITALS — BP 130/80 | Ht 59.0 in

## 2015-09-09 DIAGNOSIS — D6859 Other primary thrombophilia: Secondary | ICD-10-CM

## 2015-09-09 DIAGNOSIS — Z993 Dependence on wheelchair: Secondary | ICD-10-CM

## 2015-09-09 DIAGNOSIS — E119 Type 2 diabetes mellitus without complications: Secondary | ICD-10-CM

## 2015-09-09 DIAGNOSIS — R600 Localized edema: Secondary | ICD-10-CM | POA: Diagnosis not present

## 2015-09-09 DIAGNOSIS — R262 Difficulty in walking, not elsewhere classified: Secondary | ICD-10-CM | POA: Diagnosis not present

## 2015-09-09 DIAGNOSIS — R29898 Other symptoms and signs involving the musculoskeletal system: Secondary | ICD-10-CM

## 2015-09-09 DIAGNOSIS — G894 Chronic pain syndrome: Secondary | ICD-10-CM

## 2015-09-09 DIAGNOSIS — I749 Embolism and thrombosis of unspecified artery: Secondary | ICD-10-CM

## 2015-09-09 LAB — POCT GLYCOSYLATED HEMOGLOBIN (HGB A1C): HEMOGLOBIN A1C: 6.2

## 2015-09-09 MED ORDER — OXYCODONE-ACETAMINOPHEN 5-325 MG PO TABS: 1.0000 | ORAL_TABLET | Freq: Three times a day (TID) | ORAL | Status: DC | PRN

## 2015-09-09 MED ORDER — FUROSEMIDE 20 MG PO TABS: 20.0000 mg | ORAL_TABLET | ORAL | Status: DC

## 2015-09-09 MED ORDER — METFORMIN HCL 500 MG PO TABS: ORAL_TABLET | ORAL | Status: DC

## 2015-09-09 NOTE — Progress Notes (Addendum)
   Subjective:    Patient ID: [name], female    DOB: 09/19/1966, 49 y.o.   MRN: [id number]  HPI Patient is here today for evaluation for an electric wheelchair.  This was a face-to-face evaluation. This is an evaluation if the patient needs a powered wheelchair. This patient has had significant health decline over the past couple years. She is dependent upon others for her care. She does not have the strength to walk or our manual wheelchair. The vast majority of today's visit is to address this issue. I have read over and agree with occupational medicine/physical medicine evaluation and agree with it. The occupational medicine evaluation was reviewed and I agree with their findings. Patient also is having trouble retaining fluid all over her body. This has been present for awhile now. Patient has no other concerns at this time.    Review of Systems She relates fatigue tiredness she relates swelling in the legs she denies shortness of breath she just states denies any chest tightness coughing wheezing vomiting diarrhea.    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  40 minutes spent with patient greater than half in discussion of multiple different issues including her chronic pain pain medication new onset pedal edema ruling out CHF discussion of her worsening diabetes and discussion of wheelchair status as well as ease other chronic health issues. And monitoring of her it hypercoagulability problem    Assessment & Plan:  Profound weakness of the arms and legs  was discussed and addressed. This patient needs a power wheelchair in order to help her with simple ADLs of life. I agree with the physical medicine and occupational medicine evaluation. I agree with the need for a powered wheelchair.  1. Pedal edema Start diuretic. Follow-up metabolic 7 continue potassium - Basic metabolic panel  2. Weakness of both legs This is permanent. Related to the neuropathy in the underlying neuro condition. - Basic metabolic panel  3. Primary hypercoagulable state [facility]) Patient will always be on blood thinner she is to continue this - Basic metabolic panel  4. Difficulty walking Patient unable to walk she is wheelchair-bound. - Basic metabolic panel  5. Chronic pain syndrome Patient with chronic pain discomfort for neuropathy and lower back pain medication 3 scripts given do not recommend more than 3 tablets per day The patient was seen today as part of a comprehensive visit regarding pain control. Patient's compliance with the medication as well as discussion regarding effectiveness was completed. Prescriptions were written. Patient was advised to follow-up in 3 months. The patient was assessed for any signs of severe side effects. The patient was advised to take the medicine as directed and to report to [place] if any side effect issues.   - Basic metabolic panel  6. Type 2 diabetes mellitus without complication, without long-term current use of insulin (HCC) A1c is gone up recommend metformin half tablet twice daily recheck A1c 3 months - POCT glycosylated hemoglobin (Hb 123XX123) - Basic metabolic panel

## 2015-09-16 ENCOUNTER — Ambulatory Visit: Payer: Medicare Other | Admitting: Family Medicine

## 2015-09-17 ENCOUNTER — Ambulatory Visit: Payer: Medicare Other | Admitting: Family Medicine

## 2015-09-17 ENCOUNTER — Ambulatory Visit (INDEPENDENT_AMBULATORY_CARE_PROVIDER_SITE_OTHER): Payer: Medicare Other | Admitting: Family Medicine

## 2015-09-17 VITALS — BP 120/72 | Temp 98.4°F | Ht 59.0 in | Wt 165.0 lb

## 2015-09-17 DIAGNOSIS — H65112 Acute and subacute allergic otitis media (mucoid) (sanguinous) (serous), left ear: Secondary | ICD-10-CM | POA: Diagnosis not present

## 2015-09-17 DIAGNOSIS — I749 Embolism and thrombosis of unspecified artery: Secondary | ICD-10-CM | POA: Diagnosis not present

## 2015-09-17 DIAGNOSIS — H6122 Impacted cerumen, left ear: Secondary | ICD-10-CM

## 2015-09-17 MED ORDER — NEOMYCIN-POLYMYXIN-HC 3.5-10000-1 OT SUSP: 4.0000 [drp] | Freq: Four times a day (QID) | OTIC | Status: AC

## 2015-09-17 MED ORDER — CIPROFLOXACIN HCL 500 MG PO TABS: 500.0000 mg | ORAL_TABLET | Freq: Two times a day (BID) | ORAL | Status: AC

## 2015-09-17 MED ORDER — ALBUTEROL SULFATE HFA 108 (90 BASE) MCG/ACT IN AERS: 2.0000 | INHALATION_SPRAY | Freq: Four times a day (QID) | RESPIRATORY_TRACT | Status: DC | PRN

## 2015-09-17 NOTE — Progress Notes (Signed)
   Subjective:    Patient ID: [name], female    DOB: 10/27/1965, 49 y.o.   MRN: [id number]  Otalgia  There is pain in the left ear. Chronicity: 4 days ago. Treatments tried: sweet oil.   Wheezing for the past couple days. Using inhaler.  Patient relates low back cough and congestion denies high fever chills denies nausea vomiting diarrhea denies shortness of breath. Has had a little bit ahead congestion. Has some underlying neurologic issues.  Review of Systems  HENT: Positive for ear pain.        Objective:   Physical Exam Left ear canal red cerumen impaction noted neck no masses lungs clear heart regular Patient with no wheezing on current exam      Assessment & Plan:  Viral URI Left otitis externa none Antibiotic drops and medications sent in Referral to ENT to have ear canal debris removed Review on albuterol, if progressive troubles notify us

## 2015-09-20 ENCOUNTER — Encounter: Payer: Self-pay | Admitting: Family Medicine

## 2015-09-23 ENCOUNTER — Ambulatory Visit (INDEPENDENT_AMBULATORY_CARE_PROVIDER_SITE_OTHER): Payer: Medicare Other | Admitting: Otolaryngology

## 2015-09-23 DIAGNOSIS — R262 Difficulty in walking, not elsewhere classified: Secondary | ICD-10-CM | POA: Diagnosis not present

## 2015-09-23 DIAGNOSIS — H903 Sensorineural hearing loss, bilateral: Secondary | ICD-10-CM

## 2015-09-23 DIAGNOSIS — G894 Chronic pain syndrome: Secondary | ICD-10-CM | POA: Diagnosis not present

## 2015-09-23 DIAGNOSIS — H6122 Impacted cerumen, left ear: Secondary | ICD-10-CM | POA: Diagnosis not present

## 2015-09-23 DIAGNOSIS — R739 Hyperglycemia, unspecified: Secondary | ICD-10-CM | POA: Diagnosis not present

## 2015-09-23 DIAGNOSIS — R29898 Other symptoms and signs involving the musculoskeletal system: Secondary | ICD-10-CM | POA: Diagnosis not present

## 2015-09-23 DIAGNOSIS — D6859 Other primary thrombophilia: Secondary | ICD-10-CM | POA: Diagnosis not present

## 2015-09-23 DIAGNOSIS — R600 Localized edema: Secondary | ICD-10-CM | POA: Diagnosis not present

## 2015-09-24 LAB — BASIC METABOLIC PANEL
BUN/Creatinine Ratio: 10 (ref 9–23)
BUN: 9 mg/dL (ref 6–24)
CALCIUM: 9.4 mg/dL (ref 8.7–10.2)
CO2: 28 mmol/L (ref 18–29)
CREATININE: 0.89 mg/dL (ref 0.57–1.00)
Chloride: 94 mmol/L — ABNORMAL LOW (ref 96–106)
GFR calc Af Amer: 87 mL/min/{1.73_m2} (ref >59)
GFR, EST NON AFRICAN AMERICAN: 76 mL/min/{1.73_m2} (ref >59)
Glucose: 130 mg/dL — ABNORMAL HIGH (ref 65–99)
POTASSIUM: 4.2 mmol/L (ref 3.5–5.2)
Sodium: 140 mmol/L (ref 134–144)

## 2015-09-26 ENCOUNTER — Encounter: Payer: Self-pay | Admitting: Family Medicine

## 2015-09-30 ENCOUNTER — Other Ambulatory Visit: Payer: Self-pay | Admitting: Family Medicine

## 2015-10-20 IMAGING — CT CT ABD-PELV W/ CM
2 of 5 series · 16 of 46 positions shown, 18 images · IV contrast (Omni 300)
Comparison: 10/03/2014 and 09/26/2014.

CLINICAL DATA: Septic shock, cardiac arrest and abdominal surgery
for perforated small bowel and perforated gastric ulcer.

EXAM:
CT ABDOMEN AND PELVIS WITH CONTRAST
TECHNIQUE: Multidetector CT imaging of the abdomen and pelvis was performed
using the standard protocol following bolus administration of
intravenous contrast.
CONTRAST:  80 mL Omnipaque 300 IV

[Series 2: abd/ pelvis 5.0 i30f 1 · axial · 0.82mm/px · z∈[+587,+982]mm · 13 of 89 slices shown, 15 images]
[im 5/89  soft-tissue]
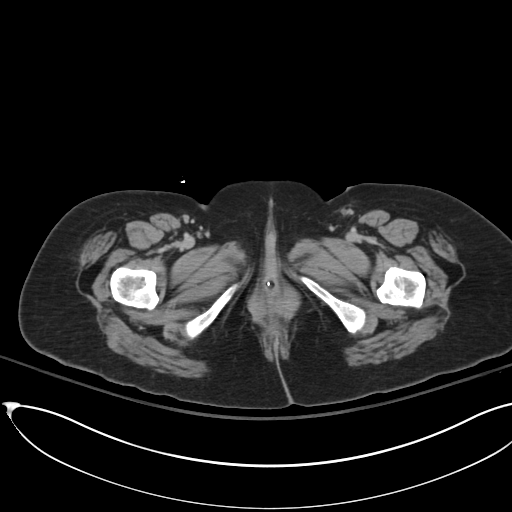
[im 5/89  bone]
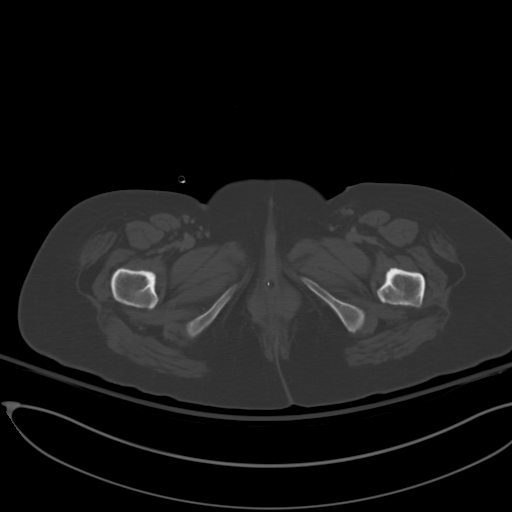
[im 13/89  soft-tissue]
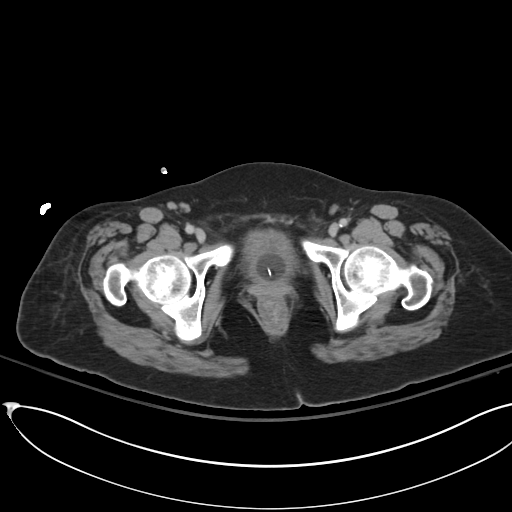
[im 17/89  soft-tissue]
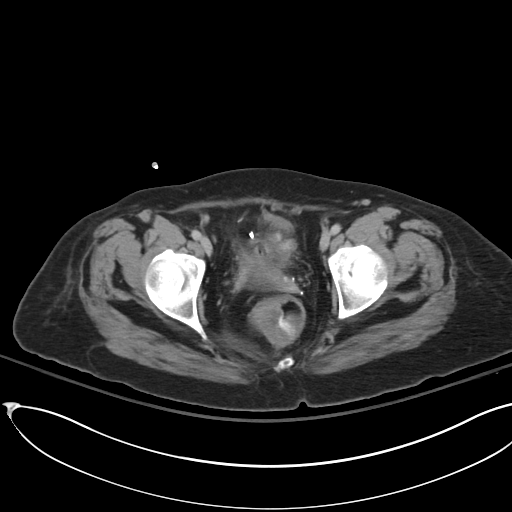
[im 26/89  soft-tissue]
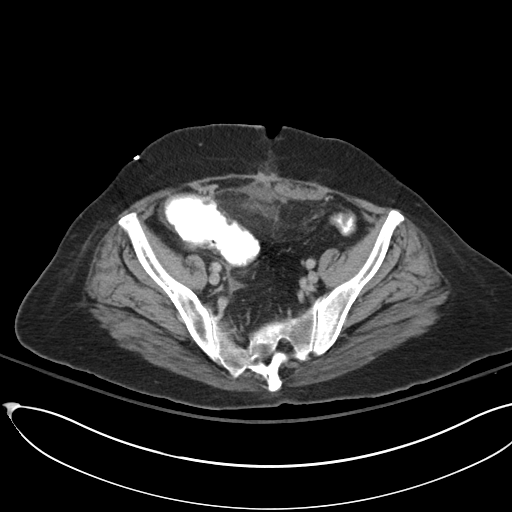
[im 30/89  soft-tissue]
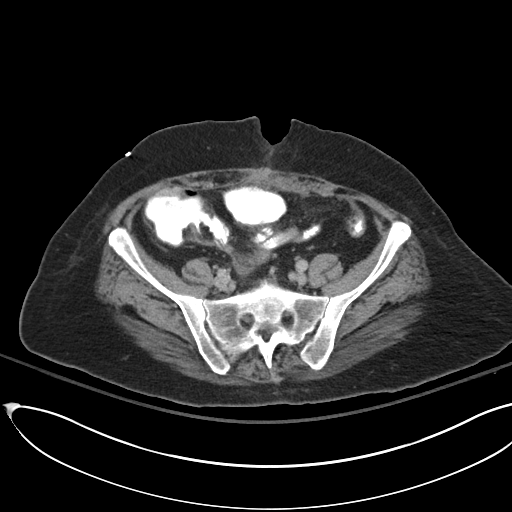
[im 38/89  soft-tissue]
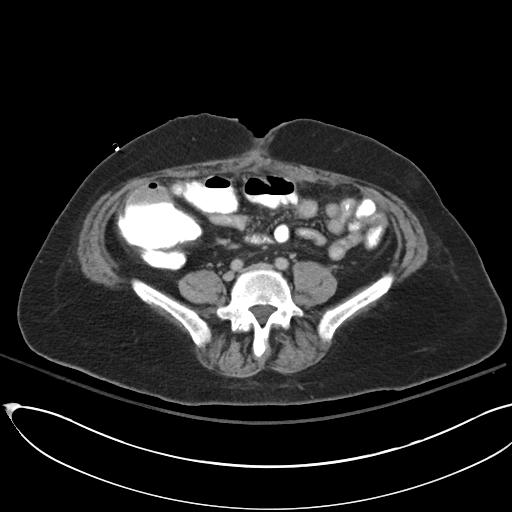
[im 47/89  soft-tissue]
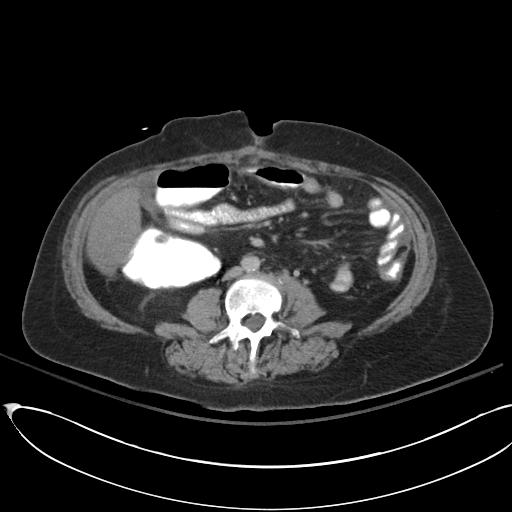
[im 51/89  soft-tissue]
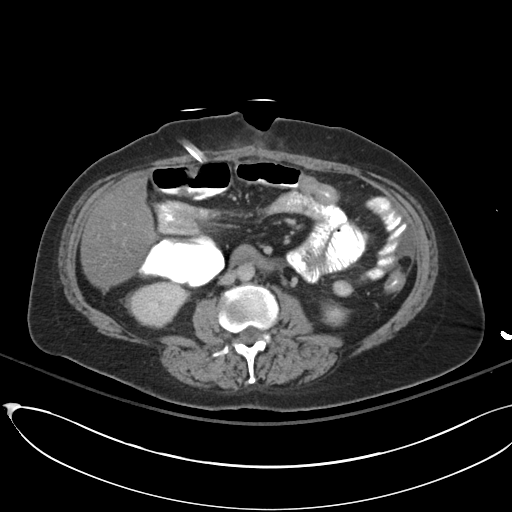
[im 59/89  soft-tissue]
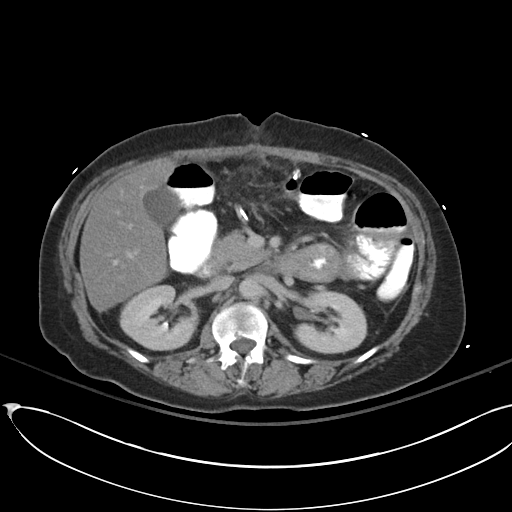
[im 59/89  bone]
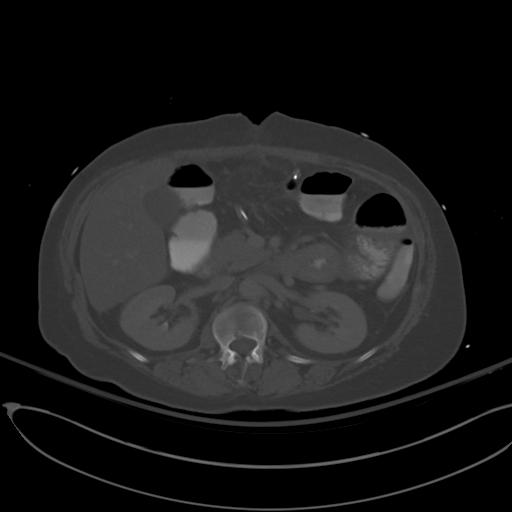
[im 63/89  soft-tissue]
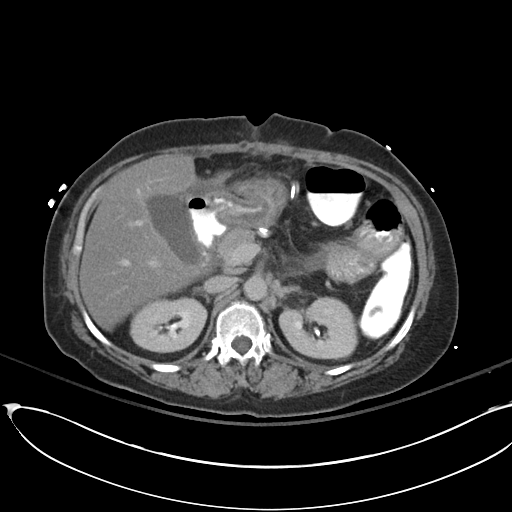
[im 72/89  soft-tissue]
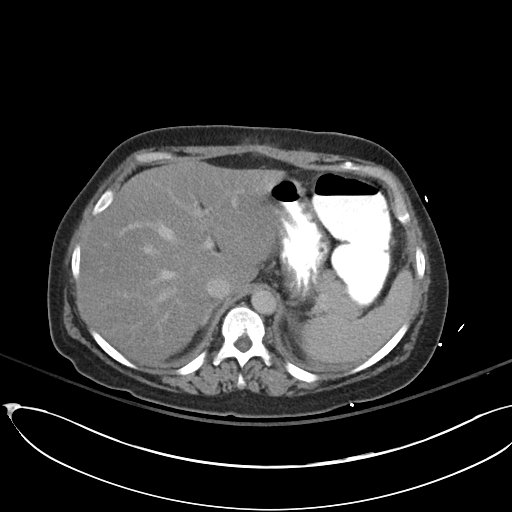
[im 76/89  soft-tissue]
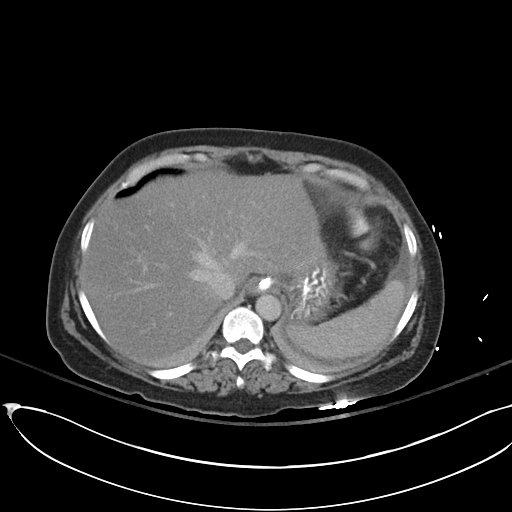
[im 84/89  soft-tissue]
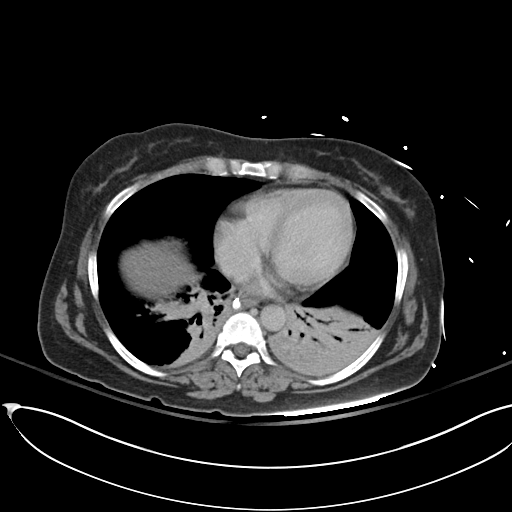

[Series 5: coronals · coronal · 0.70mm/px · 3 of 113 slices shown]
[im 38/113  soft-tissue]
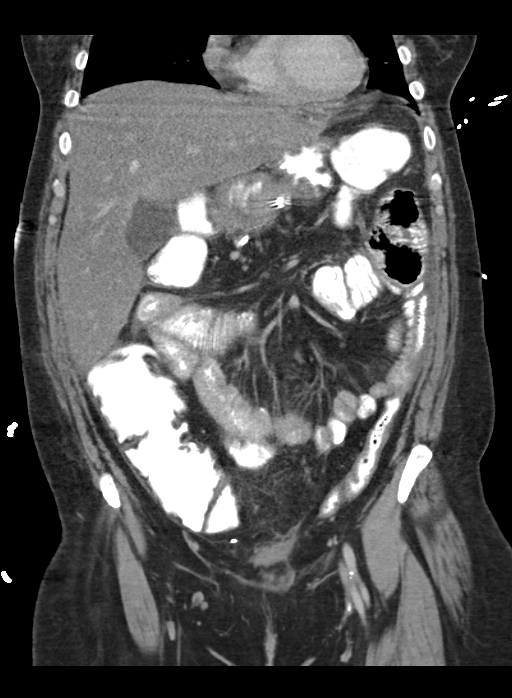
[im 50/113  soft-tissue]
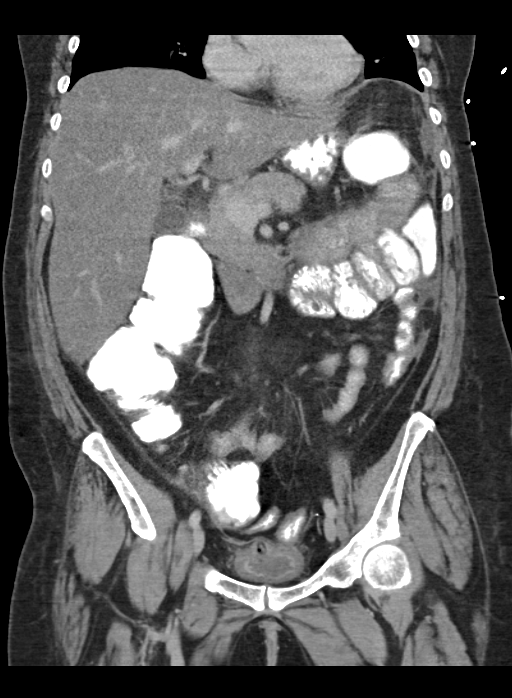
[im 63/113  soft-tissue]
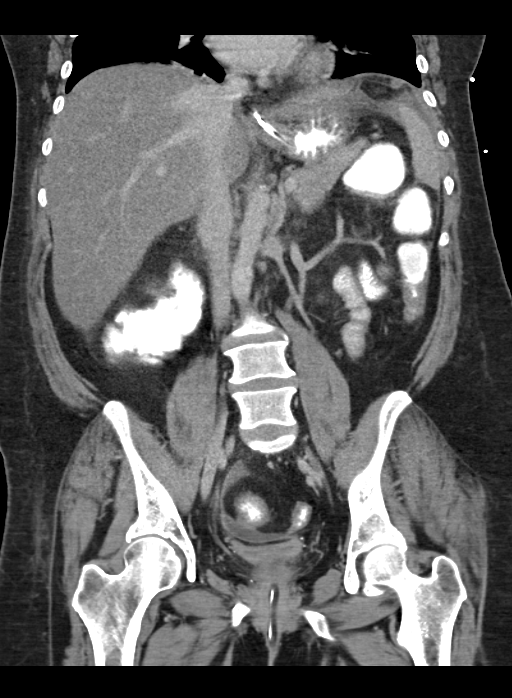

[16 of 46 positions shown; findings below may reference images not displayed]

FINDINGS: Lung bases show a worsening consolidation and atelectasis involving
the posterior lower lobes bilaterally, left greater than right.
Small left pleural effusion has resolved.

Stable hepatic steatosis. The gallbladder, pancreas, spleen, adrenal
glands and kidneys are unremarkable. Single surgical drain remains
present in the upper mid abdomen without evidence of abscess or
significant free fluid in the peritoneal cavity. Trace fluid is
present in the lower pelvis. Midline surgical wound shows stable
appearance.

Nasogastric tube extends to the level of the distal stomach.
Administered oral contrast shows normal transit with no evidence of
bowel obstruction or bowel perforation. No free intraperitoneal air.
No hernias identified. The bladder is decompressed by a Foley
catheter. Bony structures show stable fusion of the L1 and L2
vertebral bodies.
IMPRESSION: 1. Increase consolidation and atelectasis involving the posterior
lower lobes bilaterally, left greater than right. Resolution of left
pleural effusion.
2. No evidence of bowel obstruction, perforation or focal abscess.

## 2015-11-08 ENCOUNTER — Other Ambulatory Visit: Payer: Self-pay | Admitting: Family Medicine

## 2015-11-10 ENCOUNTER — Telehealth: Payer: Self-pay | Admitting: Family Medicine

## 2015-11-10 NOTE — Telephone Encounter (Signed)
[name] from [place] called stating [name] has been denied for her power chair their are certain things [employer] is looking for in the face to face. She is wondering if you can do addemum to 09/09/15 note that would help her get the power chair if you need to speak with her the number is [phone] [name])

## 2015-11-18 DIAGNOSIS — G243 Spasmodic torticollis: Secondary | ICD-10-CM | POA: Diagnosis not present

## 2015-12-01 ENCOUNTER — Other Ambulatory Visit: Payer: Self-pay | Admitting: Family Medicine

## 2015-12-08 NOTE — Telephone Encounter (Signed)
Spoke with [name] with [facility] and reviewed addendum to office note from 09/09/15. [name] stated that she believes this office note is good enough and we can fax note to [place] at [phone] attention to [name]

## 2015-12-08 NOTE — Telephone Encounter (Signed)
Please call [name]  with [facility]. Please discuss with her in reference to the letter that she sent. Regarding [name]. I have printed my note. Review it with her. See if there is anything additional that I need to include. In addition to this this patient is coming in tomorrow so therefore if I need to document additional information please find this out to help guide our care thank you

## 2015-12-08 NOTE — Telephone Encounter (Signed)
Please be certain to do so thank you

## 2015-12-08 NOTE — Telephone Encounter (Signed)
Left message for [name] at [name] to return our call.

## 2015-12-09 ENCOUNTER — Ambulatory Visit (INDEPENDENT_AMBULATORY_CARE_PROVIDER_SITE_OTHER): Payer: Medicare Other | Admitting: Family Medicine

## 2015-12-09 ENCOUNTER — Encounter: Payer: Self-pay | Admitting: Family Medicine

## 2015-12-09 VITALS — BP 128/82 | Ht 59.0 in

## 2015-12-09 DIAGNOSIS — Z79899 Other long term (current) drug therapy: Secondary | ICD-10-CM

## 2015-12-09 DIAGNOSIS — R7303 Prediabetes: Secondary | ICD-10-CM

## 2015-12-09 DIAGNOSIS — G894 Chronic pain syndrome: Secondary | ICD-10-CM

## 2015-12-09 DIAGNOSIS — E785 Hyperlipidemia, unspecified: Secondary | ICD-10-CM | POA: Diagnosis not present

## 2015-12-09 DIAGNOSIS — E119 Type 2 diabetes mellitus without complications: Secondary | ICD-10-CM | POA: Diagnosis not present

## 2015-12-09 LAB — POCT GLYCOSYLATED HEMOGLOBIN (HGB A1C): Hemoglobin A1C: 6.4

## 2015-12-09 MED ORDER — OXYCODONE-ACETAMINOPHEN 5-325 MG PO TABS: 1.0000 | ORAL_TABLET | Freq: Three times a day (TID) | ORAL | Status: DC | PRN

## 2015-12-09 MED ORDER — GABAPENTIN 300 MG PO CAPS: ORAL_CAPSULE | ORAL | Status: DC

## 2015-12-09 MED ORDER — METFORMIN HCL 500 MG PO TABS: ORAL_TABLET | ORAL | Status: DC

## 2015-12-09 MED ORDER — POLYETHYLENE GLYCOL 3350 17 GM/SCOOP PO POWD: ORAL | Status: DC

## 2015-12-09 NOTE — Progress Notes (Signed)
   Subjective:    Patient ID: [name], female    DOB: Apr 11, 1966, 50 y.o.   MRN: [id number]  HPI  This patient was seen today for chronic pain  The medication list was reviewed and updated.   -Compliance with medication: yes  - Number patient states they take daily: 3  -when was the last dose patient took? today  The patient was advised the importance of maintaining medication and not using illegal substances with these.  Refills needed: yes  The patient was educated that we can provide 3 monthly scripts for their medication, it is their responsibility to follow the instructions.  Side effects or complications from medications: none  Patient is aware that pain medications are meant to minimize the severity of the pain to allow their pain levels to improve to allow for better function. They are aware of that pain medications cannot totally remove their pain.  Due for UDT ( at least once per year) :  Patient with burning in the legs she denies any chest tightness pressure pain has been relates sugars been hovering in the 150-170 patient taking pain medicine as directed  25 minutes spent discussing pain discussing medications discussing prediabetes discussing a better diet help her lose weight.  Review of Systems Patient denies chest tightness pressure pain shortness breath she relates burning in her feet denies any bleeding complications.    Objective:   Physical Exam Lungs are clear hearts regular significant weakness of the arms and legs with contractures in the left hand neurologically patient is alert and interactive but patient is permanently disabled       Assessment & Plan:  prediabetes-glucoses are up but A1c looks good we will increase metformin 500 mg twice a day Significant obesity importance of getting a better diet staying away from all sugary drinks Chronic pain The patient was seen today as part of a comprehensive visit regarding pain control. Patient's  compliance with the medication as well as discussion regarding effectiveness was completed. Prescriptions were written. Patient was advised to follow-up in 3 months. The patient was assessed for any signs of severe side effects. The patient was advised to take the medicine as directed and to report to [place] if any side effect issues. Hyperlipidemia continue current medication check lab work Increased dose of gabapentin because of neuropathy in the legs Lab work recommended As clotting disorder has to take blood thinner for lifetime 25 minutes was spent with the patient. Greater than half the time was spent in discussion and answering questions and counseling regarding the issues that the patient came in for today.

## 2015-12-14 ENCOUNTER — Ambulatory Visit (HOSPITAL_COMMUNITY): Payer: Self-pay | Admitting: Hematology & Oncology

## 2015-12-17 ENCOUNTER — Other Ambulatory Visit: Payer: Self-pay | Admitting: Family Medicine

## 2015-12-28 ENCOUNTER — Other Ambulatory Visit: Payer: Self-pay | Admitting: Family Medicine

## 2016-01-03 NOTE — Progress Notes (Signed)
This encounter was created in error - please disregard.

## 2016-01-07 ENCOUNTER — Encounter (HOSPITAL_COMMUNITY): Payer: BLUE CROSS/BLUE SHIELD | Attending: Hematology & Oncology | Admitting: Hematology & Oncology

## 2016-01-07 ENCOUNTER — Encounter (HOSPITAL_COMMUNITY): Payer: Self-pay | Admitting: Hematology & Oncology

## 2016-01-07 VITALS — BP 98/56 | HR 63 | Temp 97.8°F | Resp 20

## 2016-01-07 DIAGNOSIS — D6851 Activated protein C resistance: Secondary | ICD-10-CM

## 2016-01-07 DIAGNOSIS — Z7901 Long term (current) use of anticoagulants: Secondary | ICD-10-CM | POA: Diagnosis not present

## 2016-01-07 DIAGNOSIS — I82B12 Acute embolism and thrombosis of left subclavian vein: Secondary | ICD-10-CM

## 2016-01-07 DIAGNOSIS — I82890 Acute embolism and thrombosis of other specified veins: Secondary | ICD-10-CM | POA: Diagnosis not present

## 2016-01-07 DIAGNOSIS — I749 Embolism and thrombosis of unspecified artery: Secondary | ICD-10-CM

## 2016-01-07 NOTE — Progress Notes (Signed)
[name] PROGRESS NOTE  [facility] Care Team: Kathyrn Drown, MD as PCP - General (Family Medicine) Flossie Buffy. Redmond Pulling, MD as Referring Physician (Otolaryngology)  CHIEF COMPLAINTS/PURPOSE OF CONSULTATION:   LUE Arterial Thrombus in Tammy setting of acute illness History of pernicious anemia Multiple Sclerosis History of Guillian Barre Syndrome  ? 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 female is here because of an arterial thrombosis. [name] has a very complicated medical history. [name] was admitted to Tammy [facility] system on December 19 of 2015 with a CT of Tammy abdomen showing pneumoperitoneum. [name] had a cold left hand concerning for an arterial clot. On 09/27/2014 [name] underwent a left brachial and ulnar artery embolectomy and also an exploratory laparotomy with an omental patch for a perforated prepyloric gastric ulcer. [name] developed a reocclusion of Tammy left upper extremity and underwent a left brachial to left radial artery bypass with left leg greater saphenous vein. During [name] hospital admission [name] was coded for 4 minutes. [name] remarkably improved and was discharged from Tammy hospital.  Prior to [name] hospitalization [name] noted a 1 day history of [name] hand feeling "weird" [name] has a history of neuropathy in that hand. At Tammy time of [name] hospitalization in December [name] was a current smoker.   [name] returns to Tammy [place] today accompanied by [name] husband.  [name] is in a wheelchair.  [name] says [name]'s "been up to nothing" and has had no more blood clots, and has been staying out of Tammy hospital. [name] is still seeing Dr.  [name].  [name] notes that [name] arm hurts all Tammy time, and [name] is receiving some Botox treatments to help it. [name] husband remarks that this is helping a little bit, but [name]'s only had it 3 times.  [name] is agreeable to seeing [place] here at [facility] once a year, since [name] hasn't had any more problems. From a hematologic perspective, [name] is doing okay.  [name] cannot ambulate at all now; [name] husband notes that [name] can take a few steps while someone is holding on to [name], but that's about it.  [name] denies blood in [name] urine, blood in [name] stool, and denies any nosebleeds. [name] says [name] is not moving [name] bowels well, and is constipated. [name] confirms [name] is eating though. [name] notes [name]'s eating "too good."  One of [name] sons has gotten tested for factor V leiden, but Tammy other one won't.   MEDICAL HISTORY:  Past Medical History  Diagnosis Date  . Anxiety   . Depression   . COPD (chronic obstructive pulmonary disease) ([place])   . MS (multiple sclerosis) ([place])   . Fibromyalgia   . Impaired fasting glucose   . 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 Eating disorder   . Chronic low back pain   . PONV (postoperative nausea and vomiting)   . Chronic bronchitis ([place])     "yearly" (02/11/2013)  . Borderline diabetes   . GERD (gastroesophageal reflux disease)   . Migraine     "I use to" (02/11/2013)  . Arthritis     "hands & feet" (02/11/2013)  . Peripheral  neuropathy ([place])     [name] 02/11/2013  . B12 deficiency anemia     [name] 02/11/2013  . Chronic pain syndrome     [name] 02/11/2013  . UTI (lower urinary tract infection) 02/11/2013    [name] 02/11/2013  . Chronic edema     BLE/notes 02/11/2013  . Ataxia   . Peripheral neuropathy ([place])   . Muscle weakness of lower extremity     bilateral  . Hypertension     SURGICAL HISTORY: Past Surgical History  Procedure Laterality Date  . Abdominal hysterectomy  ~ 1987; ~ 2004    "woodward; ferguson" (02/11/2013)  . Cesarean  section  [id number]; 1984; 1986  . Anterior cervical decomp/discectomy fusion  2009; 2011  . Tonsillectomy  1990's?  . Cataract extraction w/phaco  06/20/2011    Procedure: CATARACT EXTRACTION PHACO AND INTRAOCULAR LENS PLACEMENT (IOC);  Surgeon: [name] T. Gershon Crane;  Location: [facility];  Service: Ophthalmology;  Laterality: Left;  CDE 1.81  . Cataract extraction w/phaco  07/04/2011    Procedure: CATARACT EXTRACTION PHACO AND INTRAOCULAR LENS PLACEMENT (IOC);  Surgeon: [name] T. Gershon Crane;  Location: [facility];  Service: Ophthalmology;  Laterality: Right;  CDE: 1.76  . Yag laser application Right [id number]    Procedure: YAG LASER APPLICATION;  Surgeon: [name] T. Gershon Crane, MD;  Location: [facility];  Service: Ophthalmology;  Laterality: Right;  . Yag laser application Left [id number]    Procedure: YAG LASER APPLICATION;  Surgeon: [name] T. Gershon Crane, MD;  Location: [facility];  Service: Ophthalmology;  Laterality: Left;  . Appendectomy    . Laparotomy N/A 09/27/2014    Procedure: Exploratory Laparotomy, Biopsy of Perforated Gastric Ulcer, Closure with omental patch;  Surgeon: [name], MD;  Location: [facility];  Service: General;  Laterality: N/A;  . Embolectomy Left 09/27/2014    Procedure: Left Brachial, Radial,Ulnar Embolectomy with patch angioplasty left brachial, radial and ulnar artery;  Surgeon: [name], MD;  Location: [facility];  Service: Vascular;  Laterality: Left;  . Embolectomy Left 09/27/2014    Procedure: Left Radial, Brachial, and Ulnar Thrombectomy; Left Brachial to Radial Bypass Graft using Greater Saphenous vein graft from Left Thigh; Left Saphenous Vein Harvest; Intraoperative Arteriogram; Intra-arterial administration of TPA;  Surgeon: [name], MD;  Location: [facility];  Service: Vascular;  Laterality: Left;  . Tracheostomy N/A december 2015    SOCIAL HISTORY: Social History   Social History  . Marital Status: Divorced    Spouse Name: N/A  . Number of Children: N/A  . Years of Education: N/A    Occupational History  . Not on file.   Social History Main Topics  . Smoking status: Former Smoker -- 1.00 packs/day for 33 years    Types: Cigarettes  . Smokeless tobacco: Former Systems developer    Quit date: 09/08/2014  . Alcohol Use: No  . Drug Use: No  . Sexual Activity: Not Currently    Birth Control/ Protection: Surgical   Other Topics Concern  . Not on file   Social History Narrative   he is divorced. [name] was a 2 pack per day smoker [name] entire life until [name] quit in December of last year. [name] has 2 sons, 3 grandchildren. [name] does not drink alcohol. 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   FAMILY HISTORY: Family History  Problem Relation Age of Onset  . Anesthesia problems Neg Hx   . Hypotension Neg Hx   . Malignant hyperthermia Neg Hx   . Pseudochol  deficiency Neg Hx   . Hyperlipidemia Mother   . Heart disease Mother   . Cancer Mother     lung  . Diabetes Father   . Heart disease Father   . Cancer Maternal Grandmother     breast   indicated that [name] mother is deceased. [name] indicated that [name] father is deceased.  Other died at 43 from lung cancer, [name] was a heavy smoker. Father died at 69 from an MI. [name] has no family history of blood clots. He has 2 brothers and 1 sister.   ALLERGIES:  is allergic to ambien; ceftin; hctz; lodine; promethazine; codeine; latex; and penicillins.  MEDICATIONS:  Current Outpatient Prescriptions  Medication Sig Dispense Refill  . albuterol (PROVENTIL HFA;VENTOLIN HFA) 108 (90 BASE) MCG/ACT inhaler Inhale 2 puffs into Tammy lungs every 6 (six) hours as needed for wheezing. 1 Inhaler 2  . ELIQUIS 5 MG TABS tablet TAKE 1 TABLET BY MOUTH TWICE DAILY. 60 tablet 5  . folic acid (FOLVITE) 1 MG tablet TAKE 1 TABLET BY MOUTH ONCE DAILY. 30 tablet 5  . furosemide (LASIX) 20 MG tablet Take 1 tablet (20 mg total) by mouth every morning. 30 tablet 5  . gabapentin (NEURONTIN) 300 MG capsule 2 tablets qam, 2 tablets at lunch, one tablet at  supper and 2 tablets at bedtime 210 capsule 0  . hydrocortisone 2.5 % cream Apply topically 2 (two) times daily. 60 g 2  . metFORMIN (GLUCOPHAGE) 500 MG tablet Take 1 tablet twice a day 60 tablet 5  . ONE TOUCH ULTRA TEST test strip     . ONETOUCH DELICA LANCETS [id number] MISC     . oxyCODONE-acetaminophen (PERCOCET/ROXICET) 5-325 MG tablet Take 1 tablet by mouth 3 (three) times daily as needed for severe pain. 90 tablet 0  . pantoprazole (PROTONIX) 40 MG tablet TAKE 1 TABLET BY MOUTH ONCE DAILY. 30 tablet 5  . polyethylene glycol powder (GLYCOLAX/MIRALAX) powder One cap in a glass of water 3350 g 0  . potassium chloride (K-DUR) 10 MEQ tablet TAKE 2 TABLETS BY MOUTH ONCE DAILY. 60 tablet 5  . senna (SENOKOT) 8.6 MG tablet Take 1 tablet (8.6 mg total) by mouth 2 (two) times daily. 60 tablet 5  . Thiamine HCl (VITAMIN B-1 PO) Take by mouth daily.    . traZODone (DESYREL) 100 MG tablet TAKE 2 TABLETS BY MOUTH AT BEDTIME. 60 tablet 0  . vitamin B-12 (CYANOCOBALAMIN) 1000 MCG tablet Take 1,000 mcg by mouth daily.     No current facility-administered medications for this visit.    Review of Systems  Constitutional: Positive for malaise/fatigue.  HENT: Negative.   Eyes: Negative.   Respiratory: Positive for shortness of breath.   Cardiovascular: Negative.   Gastrointestinal: Positive for constipation.  Genitourinary: Negative.   Musculoskeletal: Positive for myalgias, back pain and joint pain.  Skin: Negative.   Neurological: Positive for sensory change and weakness.  Endo/Heme/Allergies: Negative.   Psychiatric/Behavioral: [name] has insomnia.   14 point review of systems was performed and is negative except as detailed under history of present illness and above    PHYSICAL EXAMINATION:  ECOG PERFORMANCE STATUS: 2 - Symptomatic, <50% confined to bed  Filed Vitals:   01/07/16 1341  BP: 98/56  Pulse: 63  Temp: 97.8 F (36.6 C)  Resp: 20   There were no vitals filed for this  visit.   Physical Exam  Constitutional: [name] is oriented to person, place, and time and well-developed, well-nourished, and in no  distress.      In a wheelchair  HENT:  Head: Normocephalic and atraumatic.  Nose: Nose normal.  Mouth/Throat: Oropharynx is clear and moist. No oropharyngeal exudate.  Eyes: Conjunctivae and EOM are normal. Pupils are equal, round, and reactive to light. Right eye exhibits no discharge. Left eye exhibits no discharge. No scleral icterus.  Neck: Normal range of motion. Neck supple. No tracheal deviation present. No thyromegaly present.  Cardiovascular: Normal rate, regular rhythm and normal heart sounds.  Exam reveals no gallop and no friction rub.   No murmur heard. Pulmonary/Chest: Effort normal and breath sounds normal. [name] has no wheezes. [name] has no rales.  Abdominal: Soft. Bowel sounds are normal. [name] exhibits no distension and no mass. There is no tenderness. There is no rebound and no guarding.  Musculoskeletal: Normal range of motion.   Lymphadenopathy:    [name] has no cervical adenopathy.  Neurological: [name] is alert and oriented to person, place, and time. No cranial nerve deficit.      Gait not assessed  Skin: Skin is warm and dry. No rash noted.  Psychiatric: Mood, memory, affect and judgment normal.  Nursing note and vitals reviewed.    LABORATORY DATA:  I have reviewed Tammy data as listed  Results for MAITHILI, GUIDA (MRN [id number]) as of 01/07/2016 11:59  Ref. Range 12/09/2015 14:04  Hemoglobin A1C Unknown 6.4    Results for NYIMA, KNAGGS (MRN [id number]) as of 06/23/2015 08:36  Ref. Range 06/16/2015 14:25  WBC Latest Ref Range: 4.0-10.5 K/uL 5.4  RBC Latest Ref Range: 3.87-5.11 MIL/uL 4.23  Hemoglobin Latest Ref Range: 12.0-15.0 g/dL 13.3  HCT Latest Ref Range: 36.0-46.0 % 40.1  MCV Latest Ref Range: 78.0-100.0 fL 94.8  MCH Latest Ref Range: 26.0-34.0 pg 31.4  MCHC Latest Ref Range: 30.0-36.0 g/dL 33.2  RDW Latest Ref Range: 11.5-15.5 % 12.2   Platelets Latest Ref Range: 150-400 K/uL 272  Neutrophils Latest Ref Range: 43-77 % 52  Lymphocytes Latest Ref Range: 12-46 % 40  Monocytes Relative Latest Ref Range: 3-12 % 6  Eosinophil Latest Ref Range: 0-5 % 2  Basophil Latest Ref Range: 0-1 % 0  NEUT# Latest Ref Range: 1.7-7.7 K/uL 2.8  Lymphocyte # Latest Ref Range: 0.7-4.0 K/uL 2.2  Monocyte # Latest Ref Range: 0.1-1.0 K/uL 0.3  Eosinophils Absolute Latest Ref Range: 0.0-0.7 K/uL 0.1  Basophils Absolute Latest Ref Range: 0.0-0.1 K/uL 0.0    ASSESSMENT & PLAN:  Factor V Leiden mutation, heterozygous Arterial thrombosis L brachial radial and ulnar arteries Thrombosis of L Subclavian Artery Thrombectomy of L brachial, radial, and ulnar arteries Patch angioplasty of Tammy Left Brachial and ulnar artery Patch angioplasty of Tammy Left Radial artery Redo of left brachial, ulnar and radial exposure Thrombectomy Left brachial, ulnar and radial artery Angiogram LUE Left brachial to left radial artery bypass with left leg greater saphenous vein Intra-arterial injection of TPA   50 year old female with multiple sclerosis and limited functional status secondary to [name] disease. [name] presented with abdominal pain and was noted at presentation to have a cold left upper extremity.[name] underwent an embolectomy,  reoccluded and ultimately underwent a left brachial to left radial artery bypass graft with left leg greater saphenous vein. Ultrasound also showed thrombosis of Tammy left subclavian artery. TEE was performed that was negative for source of emboli.  [name] is currently on Eliquis  [name] needs to continue with anticoagulation. [name] is heterozygous for Factor V Leiden. Although not commonly associated with arterial thrombosis, I recommend ongoing Eliquis  therapy  especially given Tammy serious nature of [name] thrombosis.   [name] is tolerating Eliquis therapy without difficulty.  We will move out [name] appointments to once a year. [name] follows as well with  Dr. [name]. [name] does not need any refills today.  All questions were answered. [name] knows to call Tammy clinic with any problems, questions or concerns. This note was electronically signed.    This document serves as a record of services personally performed by Ancil Linsey, MD. It was created on [name] behalf by Toni Amend, a trained medical scribe. Tammy creation of this record is based on Tammy scribe's personal observations and Tammy provider's statements to them. This document has been checked and approved by Tammy attending provider.  I have reviewed Tammy above documentation for accuracy and completeness, and I agree with Tammy above. Molli Hazard, MD  01/07/2016 3:58 PM

## 2016-01-07 NOTE — Patient Instructions (Addendum)
[place] at [facility] Discharge Instructions  RECOMMENDATIONS MADE BY THE CONSULTANT AND ANY TEST RESULTS WILL BE SENT TO YOUR REFERRING PHYSICIAN.   Exam and discussion by Dr Whitney Muse today If you have any other problems let us know  Return to see the doctor in 1 year Please call the clinic if you have any questions or concerns     Thank you for choosing [place] at [facility] to provide your oncology and hematology care.  To afford each patient quality time with our provider, please arrive at least 15 minutes before your scheduled appointment time.   Beginning January 23rd 2017 lab work for the [facility] will be done in the  Main lab at [facility] on 1st floor. If you have a lab appointment with the [place] please come in thru the  Main Entrance and check in at the main information desk  You need to re-schedule your appointment should you arrive 10 or more minutes late.  We strive to give you quality time with our providers, and arriving late affects you and other patients whose appointments are after yours.  Also, if you no show three or more times for appointments you may be dismissed from the clinic at the providers discretion.     Again, thank you for choosing [facility].  Our hope is that these requests will decrease the amount of time that you wait before being seen by our physicians.       _____________________________________________________________  Should you have questions after your visit to [facility] [address] please contact our office at [phone] between the hours of 8:30 a.m. and 4:30 p.m.  Voicemails left after 4:30 p.m. will not be returned until the following business day.  For prescription refill requests, have your pharmacy contact our office.         Resources For Cancer Patients and their Caregivers ? American Cancer Society: Can assist with transportation, wigs, general  needs, runs Look Good Feel Better.        (606)508-9230 ? Cancer Care: Provides financial assistance, online support groups, medication/co-pay assistance.  1-800-813-HOPE 786-885-6758) ? [facility] Assists [place] cancer patients and their families through emotional , educational and financial support.  343-519-0962 ? [place] DSS Where to apply for food stamps, Medicaid and utility assistance. 785-832-4886 ? [facility]: Transportation to medical appointments. 954-083-6065 ? Social Security Administration: May apply for disability if have a Stage IV cancer. (352)339-7395 979-162-8742 ? [employer], Disability and Transit Services: Assists with nutrition, care and transit needs. [phone]

## 2016-01-18 ENCOUNTER — Other Ambulatory Visit: Payer: Self-pay | Admitting: Family Medicine

## 2016-01-25 ENCOUNTER — Encounter: Payer: Self-pay | Admitting: Family Medicine

## 2016-01-25 ENCOUNTER — Telehealth: Payer: Self-pay | Admitting: Family Medicine

## 2016-01-25 NOTE — Telephone Encounter (Signed)
A letter was completed regarding this please forwarded to the patient-the letter was routed to [facility]

## 2016-01-25 NOTE — Telephone Encounter (Signed)
Pt is needing a letter stating that she is unable to attend jury duty. Pt needs this letter mailed to her by may 2.

## 2016-02-03 ENCOUNTER — Other Ambulatory Visit: Payer: Self-pay | Admitting: Family Medicine

## 2016-02-17 ENCOUNTER — Other Ambulatory Visit: Payer: Self-pay | Admitting: Family Medicine

## 2016-02-24 ENCOUNTER — Other Ambulatory Visit: Payer: Self-pay | Admitting: Family Medicine

## 2016-03-01 DIAGNOSIS — Z8673 Personal history of transient ischemic attack (TIA), and cerebral infarction without residual deficits: Secondary | ICD-10-CM | POA: Diagnosis not present

## 2016-03-01 DIAGNOSIS — G549 Nerve root and plexus disorder, unspecified: Secondary | ICD-10-CM | POA: Diagnosis not present

## 2016-03-01 DIAGNOSIS — G6281 Critical illness polyneuropathy: Secondary | ICD-10-CM | POA: Diagnosis not present

## 2016-03-01 DIAGNOSIS — R252 Cramp and spasm: Secondary | ICD-10-CM | POA: Diagnosis not present

## 2016-03-15 ENCOUNTER — Other Ambulatory Visit: Payer: Self-pay | Admitting: Family Medicine

## 2016-03-23 DIAGNOSIS — E785 Hyperlipidemia, unspecified: Secondary | ICD-10-CM | POA: Diagnosis not present

## 2016-03-23 DIAGNOSIS — Z79899 Other long term (current) drug therapy: Secondary | ICD-10-CM | POA: Diagnosis not present

## 2016-03-24 LAB — BASIC METABOLIC PANEL
BUN/Creatinine Ratio: 9 (ref 9–23)
BUN: 8 mg/dL (ref 6–24)
CALCIUM: 9.2 mg/dL (ref 8.7–10.2)
CHLORIDE: 95 mmol/L — AB (ref 96–106)
CO2: 28 mmol/L (ref 18–29)
CREATININE: 0.87 mg/dL (ref 0.57–1.00)
GFR calc Af Amer: 90 mL/min/{1.73_m2} (ref >59)
GFR calc non Af Amer: 78 mL/min/{1.73_m2} (ref >59)
GLUCOSE: 123 mg/dL — AB (ref 65–99)
Potassium: 3.9 mmol/L (ref 3.5–5.2)
Sodium: 142 mmol/L (ref 134–144)

## 2016-03-24 LAB — LIPID PANEL
CHOLESTEROL TOTAL: 194 mg/dL (ref 100–199)
Chol/HDL Ratio: 4 ratio units (ref 0.0–4.4)
HDL: 48 mg/dL (ref >39)
LDL CALC: 108 mg/dL — AB (ref 0–99)
TRIGLYCERIDES: 190 mg/dL — AB (ref 0–149)
VLDL CHOLESTEROL CAL: 38 mg/dL (ref 5–40)

## 2016-03-24 LAB — CBC WITH DIFFERENTIAL/PLATELET
BASOS ABS: 0 10*3/uL (ref 0.0–0.2)
Basos: 0 %
EOS (ABSOLUTE): 0.1 10*3/uL (ref 0.0–0.4)
Eos: 2 %
HEMOGLOBIN: 14.2 g/dL (ref 11.1–15.9)
Hematocrit: 42.3 % (ref 34.0–46.6)
IMMATURE GRANS (ABS): 0 10*3/uL (ref 0.0–0.1)
IMMATURE GRANULOCYTES: 0 %
Lymphocytes Absolute: 3 10*3/uL (ref 0.7–3.1)
Lymphs: 44 %
MCH: 31.9 pg (ref 26.6–33.0)
MCHC: 33.6 g/dL (ref 31.5–35.7)
MCV: 95 fL (ref 79–97)
MONOCYTES: 6 %
Monocytes Absolute: 0.4 10*3/uL (ref 0.1–0.9)
Neutrophils Absolute: 3.3 10*3/uL (ref 1.4–7.0)
Neutrophils: 48 %
Platelets: 269 10*3/uL (ref 150–379)
RBC: 4.45 x10E6/uL (ref 3.77–5.28)
RDW: 13.8 % (ref 12.3–15.4)
WBC: 6.8 10*3/uL (ref 3.4–10.8)

## 2016-03-24 LAB — HEPATIC FUNCTION PANEL
ALT: 10 IU/L (ref 0–32)
AST: 14 IU/L (ref 0–40)
Albumin: 4.1 g/dL (ref 3.5–5.5)
Alkaline Phosphatase: 127 IU/L — ABNORMAL HIGH (ref 39–117)
BILIRUBIN, DIRECT: 0.09 mg/dL (ref 0.00–0.40)
Bilirubin Total: 0.3 mg/dL (ref 0.0–1.2)
TOTAL PROTEIN: 7.1 g/dL (ref 6.0–8.5)

## 2016-03-30 ENCOUNTER — Ambulatory Visit: Payer: Medicare Other | Admitting: Family Medicine

## 2016-03-31 ENCOUNTER — Encounter: Payer: Self-pay | Admitting: Family Medicine

## 2016-03-31 ENCOUNTER — Ambulatory Visit (INDEPENDENT_AMBULATORY_CARE_PROVIDER_SITE_OTHER): Payer: Medicare Other | Admitting: Family Medicine

## 2016-03-31 VITALS — BP 118/82 | Ht 59.0 in

## 2016-03-31 DIAGNOSIS — F322 Major depressive disorder, single episode, severe without psychotic features: Secondary | ICD-10-CM

## 2016-03-31 DIAGNOSIS — D6859 Other primary thrombophilia: Secondary | ICD-10-CM

## 2016-03-31 DIAGNOSIS — G4700 Insomnia, unspecified: Secondary | ICD-10-CM | POA: Diagnosis not present

## 2016-03-31 DIAGNOSIS — R29898 Other symptoms and signs involving the musculoskeletal system: Secondary | ICD-10-CM | POA: Diagnosis not present

## 2016-03-31 DIAGNOSIS — G894 Chronic pain syndrome: Secondary | ICD-10-CM | POA: Diagnosis not present

## 2016-03-31 DIAGNOSIS — F329 Major depressive disorder, single episode, unspecified: Secondary | ICD-10-CM | POA: Insufficient documentation

## 2016-03-31 DIAGNOSIS — I749 Embolism and thrombosis of unspecified artery: Secondary | ICD-10-CM

## 2016-03-31 DIAGNOSIS — D510 Vitamin B12 deficiency anemia due to intrinsic factor deficiency: Secondary | ICD-10-CM | POA: Diagnosis not present

## 2016-03-31 DIAGNOSIS — R7303 Prediabetes: Secondary | ICD-10-CM | POA: Diagnosis not present

## 2016-03-31 MED ORDER — OXYCODONE-ACETAMINOPHEN 5-325 MG PO TABS: 1.0000 | ORAL_TABLET | Freq: Three times a day (TID) | ORAL | Status: DC | PRN

## 2016-03-31 MED ORDER — DULOXETINE HCL 20 MG PO CPEP: 40.0000 mg | ORAL_CAPSULE | Freq: Every day | ORAL | Status: DC

## 2016-03-31 NOTE — Progress Notes (Signed)
Subjective:    Patient ID: [name], female    DOB: 1966-06-21, 50 y.o.   MRN: [id number]  HPI This patient was seen today for chronic pain  The medication list was reviewed and updated.   -Compliance with medication: yes  - Number patient states they take daily: 3  -when was the last dose patient took?today  The patient was advised the importance of maintaining medication and not using illegal substances with these.  Refills needed: yes  The patient was educated that we can provide 3 monthly scripts for their medication, it is their responsibility to follow the instructions.  Side effects or complications from medications: none  Patient is aware that pain medications are meant to minimize the severity of the pain to allow their pain levels to improve to allow for better function. They are aware of that pain medications cannot totally remove their pain.  Due for UDT ( at least once per year) : This be on next visit    Patient has history of anemia. Has had some fatigue Patient also has history of hyperlipidemia Patient with history of elevated liver enzymes. Patient with history diabetes and hypertension Patient does try to watch her diet She does try to be somewhat active but she is wheelchair-bound history of strokes and neuropathy    Review of Systems  Constitutional: Negative for activity change, appetite change and fatigue.  HENT: Negative for congestion.   Respiratory: Negative for cough.   Cardiovascular: Negative for chest pain.  Gastrointestinal: Negative for vomiting and abdominal pain.  Endocrine: Negative for polydipsia and polyphagia.  Neurological: Negative for weakness.  Psychiatric/Behavioral: Negative for confusion.       Objective:   Physical Exam  Constitutional: She appears well-nourished. No distress.  HENT:  Head: Normocephalic.  Cardiovascular: Normal rate, regular rhythm and normal heart sounds.   No murmur heard. Pulmonary/Chest:  Effort normal and breath sounds normal. No respiratory distress.  Musculoskeletal: She exhibits no edema.  Lymphadenopathy:    She has no cervical adenopathy.  Neurological: She is alert. She exhibits normal muscle tone.  Psychiatric: Her behavior is normal.  Vitals reviewed.         Assessment & Plan:  The patient was seen today as part of a comprehensive visit regarding pain control. Patient's compliance with the medication as well as discussion regarding effectiveness was completed. Prescriptions were written. Patient was advised to follow-up in 3 months. The patient was assessed for any signs of severe side effects. The patient was advised to take the medicine as directed and to report to [place] if any side effect issues.  The patient was seen today as part of a comprehensive visit for diabetes. The importance of keeping her A1c at or below 7 was discussed. Importance of regular physical activity was discussed. Proper monitoring of glucose levels with glucometer discussed. The importance of adherence to medication as well as a controlled low starch/sugar diet was also discussed. Also discussion regarding the importance of diabetic foot checks including self check every day. Also yearly diabetic eye exams recommended. The importance of keeping blood pressure under control and keeping LDL below 100 was also discussed. Also the importance of avoiding smoking. Standard follow-up visit recommended. Finally failure to follow good diabetic measures including self effort and compliance with recommendations can certainly increase the risk of heart disease strokes kidney failure blindness loss of limb and early death was discussed with the patient. I've encouraged patient to avoid all sugary drinks. Patient also with  significant depression over the past several weeks she denies being suicidal but she would like to have something to help her we recommended Cymbalta 20 mg she will take 1 per day for the first  week then 2 per day she will follow-up with [place] in 3-4 weeks if not significantly better her husband will monitor her otherwise she will follow-up in 3 months patient was warned that if she states starts becoming suicidal follow-up immediately This patient is permanently disabled. She could be doing better on her diet physical activity We'll repeat lipid profile and A1c in approximately 3-4 months if not at goal will need additional medication 25 minutes spent with patient discussing multiple issues. This patient does need a motorized wheelchair in order to improve her day-to-day living in to meet her ADLs. Currently she has paperwork submitted from her specialist to try to help get her this

## 2016-04-05 ENCOUNTER — Other Ambulatory Visit: Payer: Self-pay | Admitting: Family Medicine

## 2016-04-12 ENCOUNTER — Other Ambulatory Visit: Payer: Self-pay | Admitting: Family Medicine

## 2016-04-13 ENCOUNTER — Other Ambulatory Visit: Payer: Self-pay | Admitting: Family Medicine

## 2016-05-19 ENCOUNTER — Other Ambulatory Visit: Payer: Self-pay | Admitting: Family Medicine

## 2016-06-13 ENCOUNTER — Other Ambulatory Visit: Payer: Self-pay | Admitting: Family Medicine

## 2016-06-20 ENCOUNTER — Other Ambulatory Visit: Payer: Self-pay | Admitting: Family Medicine

## 2016-06-30 ENCOUNTER — Encounter: Payer: Self-pay | Admitting: Family Medicine

## 2016-06-30 ENCOUNTER — Ambulatory Visit (INDEPENDENT_AMBULATORY_CARE_PROVIDER_SITE_OTHER): Payer: Medicare Other | Admitting: Family Medicine

## 2016-06-30 VITALS — BP 100/72 | Ht 59.0 in

## 2016-06-30 DIAGNOSIS — G894 Chronic pain syndrome: Secondary | ICD-10-CM

## 2016-06-30 DIAGNOSIS — R7303 Prediabetes: Secondary | ICD-10-CM | POA: Diagnosis not present

## 2016-06-30 DIAGNOSIS — I749 Embolism and thrombosis of unspecified artery: Secondary | ICD-10-CM | POA: Diagnosis not present

## 2016-06-30 DIAGNOSIS — Z23 Encounter for immunization: Secondary | ICD-10-CM | POA: Diagnosis not present

## 2016-06-30 LAB — POCT GLYCOSYLATED HEMOGLOBIN (HGB A1C): Hemoglobin A1C: 5.3

## 2016-06-30 MED ORDER — OXYCODONE-ACETAMINOPHEN 5-325 MG PO TABS: 1.0000 | ORAL_TABLET | Freq: Three times a day (TID) | ORAL | 0 refills | Status: DC | PRN

## 2016-06-30 NOTE — Progress Notes (Signed)
   Subjective:    Patient ID: [name], female    DOB: 01/19/1966, 50 y.o.   MRN: [id number]  HPI This patient was seen today for chronic pain  The medication list was reviewed and updated.   -Compliance with medication: yes  - Number patient states they take daily: 3 daily   -when was the last dose patient took: today   The patient was advised the importance of maintaining medication and not using illegal substances with these.  Refills needed: yes  The patient was educated that we can provide 3 monthly scripts for their medication, it is their responsibility to follow the instructions.  Side effects or complications from medications: none  Patient is aware that pain medications are meant to minimize the severity of the pain to allow their pain levels to improve to allow for better function. They are aware of that pain medications cannot totally remove their pain.  Due for UDT ( at least once per year) : Patient unable to stand (disabled)  Patient has no concerns at this time.      Review of Systems  Constitutional: Negative for activity change, appetite change and fatigue.  HENT: Negative for congestion.   Respiratory: Negative for cough.   Cardiovascular: Negative for chest pain.  Gastrointestinal: Negative for abdominal pain.  Endocrine: Negative for polydipsia and polyphagia.  Neurological: Negative for weakness.  Psychiatric/Behavioral: Negative for confusion.       Objective:   Physical Exam  Constitutional: She appears well-nourished. No distress.  Cardiovascular: Normal rate, regular rhythm and normal heart sounds.   No murmur heard. Pulmonary/Chest: Effort normal and breath sounds normal. No respiratory distress.  Musculoskeletal: She exhibits no edema.  Lymphadenopathy:    She has no cervical adenopathy.  Neurological: She is alert. She exhibits normal muscle tone.  Psychiatric: Her behavior is normal.  Vitals reviewed.         Assessment &  Plan:  The patient was seen today as part of a comprehensive visit regarding pain control. Patient's compliance with the medication as well as discussion regarding effectiveness was completed. Prescriptions were written. Patient was advised to follow-up in 3 months. The patient was assessed for any signs of severe side effects. The patient was advised to take the medicine as directed and to report to [place] if any side effect issues.   Prediabetes good control continue current measures follow-up 3 months

## 2016-07-18 ENCOUNTER — Other Ambulatory Visit: Payer: Self-pay | Admitting: Family Medicine

## 2016-08-03 ENCOUNTER — Other Ambulatory Visit: Payer: Self-pay | Admitting: Family Medicine

## 2016-08-16 ENCOUNTER — Other Ambulatory Visit: Payer: Self-pay | Admitting: Family Medicine

## 2016-08-17 NOTE — Telephone Encounter (Signed)
May have refill of these +4 additional refills

## 2016-09-04 ENCOUNTER — Other Ambulatory Visit: Payer: Self-pay | Admitting: Family Medicine

## 2016-09-18 ENCOUNTER — Other Ambulatory Visit: Payer: Self-pay | Admitting: Family Medicine

## 2016-09-18 DIAGNOSIS — I749 Embolism and thrombosis of unspecified artery: Secondary | ICD-10-CM

## 2016-09-21 ENCOUNTER — Encounter: Payer: Self-pay | Admitting: Family Medicine

## 2016-09-21 ENCOUNTER — Ambulatory Visit (INDEPENDENT_AMBULATORY_CARE_PROVIDER_SITE_OTHER): Payer: Medicare Other | Admitting: Family Medicine

## 2016-09-21 VITALS — BP 108/64

## 2016-09-21 DIAGNOSIS — G894 Chronic pain syndrome: Secondary | ICD-10-CM

## 2016-09-21 DIAGNOSIS — G609 Hereditary and idiopathic neuropathy, unspecified: Secondary | ICD-10-CM | POA: Diagnosis not present

## 2016-09-21 DIAGNOSIS — Z79899 Other long term (current) drug therapy: Secondary | ICD-10-CM | POA: Diagnosis not present

## 2016-09-21 DIAGNOSIS — E781 Pure hyperglyceridemia: Secondary | ICD-10-CM

## 2016-09-21 DIAGNOSIS — D6859 Other primary thrombophilia: Secondary | ICD-10-CM

## 2016-09-21 DIAGNOSIS — R7303 Prediabetes: Secondary | ICD-10-CM | POA: Diagnosis not present

## 2016-09-21 DIAGNOSIS — R29898 Other symptoms and signs involving the musculoskeletal system: Secondary | ICD-10-CM | POA: Diagnosis not present

## 2016-09-21 DIAGNOSIS — I749 Embolism and thrombosis of unspecified artery: Secondary | ICD-10-CM

## 2016-09-21 MED ORDER — OXYCODONE-ACETAMINOPHEN 7.5-325 MG PO TABS: 1.0000 | ORAL_TABLET | Freq: Three times a day (TID) | ORAL | 0 refills | Status: DC | PRN

## 2016-09-21 NOTE — Progress Notes (Signed)
   Subjective:    Patient ID: [name], female    DOB: 1966-09-20, 50 y.o.   MRN: [id number]  HPI This patient was seen today for chronic pain. Having pain in feet and hands. Pain med not helping  The medication list was reviewed and updated.   -Compliance with medication: yes.   - Number patient states they take daily: 3 a day  -when was the last dose patient took? today  The patient was advised the importance of maintaining medication and not using illegal substances with these.  Refills needed: yes  The patient was educated that we can provide 3 monthly scripts for their medication, it is their responsibility to follow the instructions.  Side effects or complications from medications: none  Patient is aware that pain medications are meant to minimize the severity of the pain to allow their pain levels to improve to allow for better function. They are aware of that pain medications cannot totally remove their pain.  Due for UDT ( at least once per year) : cannot stand to do drug screen.   No appetite for the past 2 -3 weeks.    Last A1C 06/30/16 5.3 Long discussion held with the patient regarding her overall health. She states she is breathing okay denies any chest discomfort she does have chronic ongoing neuropathy in the hands in the legs or cause severe pain and discomfort she states she sleep and okay with the medication She states she tries keep her sugars under control She has not had any signs of any new blood clots She is taken her anticoagulants.  Review of Systems  Constitutional: Negative for activity change, appetite change and fatigue.  HENT: Negative for congestion.   Respiratory: Negative for cough.   Cardiovascular: Negative for chest pain.  Gastrointestinal: Negative for abdominal pain.  Endocrine: Negative for polydipsia and polyphagia.  Neurological: Negative for weakness.  Psychiatric/Behavioral: Negative for confusion.       Objective:   Physical Exam  Constitutional: She appears well-nourished. No distress.  Cardiovascular: Normal rate, regular rhythm and normal heart sounds.   No murmur heard. Pulmonary/Chest: Effort normal and breath sounds normal. No respiratory distress.  Musculoskeletal: She exhibits no edema.  Lymphadenopathy:    She has no cervical adenopathy.  Neurological: She is alert. She exhibits normal muscle tone.  Psychiatric: Her behavior is normal.  Vitals reviewed.         Assessment & Plan:  The patient was seen today as part of a comprehensive visit regarding pain control. Patient's compliance with the medication as well as discussion regarding effectiveness was completed. Prescriptions were written. Patient was advised to follow-up in 3 months. The patient was assessed for any signs of severe side effects. The patient was advised to take the medicine as directed and to report to [place] if any side effect issues. Patient does not abuse medication unfortunately the pain medicine no longer is helping her the way it did she is not able to be is functional and not able to be as comfortable her husband attests to this. Based on this we will increase the oxycodone is 7.5 mg 3 times daily and she will follow-up again in 2-3 months 2 prescriptions given  Prediabetes check A1c previous A1c reviewed  Hyperlipidemia watch diet closely previous labs reviewed Sort or Continue PPI for reflux Continue trazodone to help with sleep Continue Neurontin to help with neuropathy Because of hypercoagulability continue Eliquis Complex patient 25 minutes spent with patient

## 2016-09-22 ENCOUNTER — Ambulatory Visit: Payer: Medicare Other | Admitting: Family Medicine

## 2016-10-12 ENCOUNTER — Ambulatory Visit: Payer: Medicare Other | Admitting: Family Medicine

## 2016-10-12 ENCOUNTER — Emergency Department (HOSPITAL_COMMUNITY): Payer: BLUE CROSS/BLUE SHIELD

## 2016-10-12 ENCOUNTER — Encounter (HOSPITAL_COMMUNITY): Payer: Self-pay | Admitting: Emergency Medicine

## 2016-10-12 ENCOUNTER — Emergency Department (HOSPITAL_COMMUNITY)
Admission: EM | Admit: 2016-10-12 | Discharge: 2016-10-12 | Disposition: A | Discharge status: Home / Self Care | Payer: BLUE CROSS/BLUE SHIELD | Attending: Emergency Medicine | Admitting: Emergency Medicine

## 2016-10-12 DIAGNOSIS — E876 Hypokalemia: Secondary | ICD-10-CM | POA: Diagnosis not present

## 2016-10-12 DIAGNOSIS — R4182 Altered mental status, unspecified: Secondary | ICD-10-CM | POA: Diagnosis present

## 2016-10-12 DIAGNOSIS — Z7984 Long term (current) use of oral hypoglycemic drugs: Secondary | ICD-10-CM | POA: Insufficient documentation

## 2016-10-12 DIAGNOSIS — Z91040 Latex allergy status: Secondary | ICD-10-CM | POA: Insufficient documentation

## 2016-10-12 DIAGNOSIS — I10 Essential (primary) hypertension: Secondary | ICD-10-CM | POA: Insufficient documentation

## 2016-10-12 DIAGNOSIS — Z87891 Personal history of nicotine dependence: Secondary | ICD-10-CM | POA: Insufficient documentation

## 2016-10-12 DIAGNOSIS — Z79899 Other long term (current) drug therapy: Secondary | ICD-10-CM | POA: Diagnosis not present

## 2016-10-12 DIAGNOSIS — Z5181 Encounter for therapeutic drug level monitoring: Secondary | ICD-10-CM | POA: Diagnosis not present

## 2016-10-12 DIAGNOSIS — R112 Nausea with vomiting, unspecified: Secondary | ICD-10-CM | POA: Insufficient documentation

## 2016-10-12 DIAGNOSIS — J449 Chronic obstructive pulmonary disease, unspecified: Secondary | ICD-10-CM | POA: Diagnosis not present

## 2016-10-12 DIAGNOSIS — J45909 Unspecified asthma, uncomplicated: Secondary | ICD-10-CM | POA: Insufficient documentation

## 2016-10-12 DIAGNOSIS — I639 Cerebral infarction, unspecified: Secondary | ICD-10-CM | POA: Diagnosis not present

## 2016-10-12 LAB — CBC WITH DIFFERENTIAL/PLATELET
BASOS ABS: 0 10*3/uL (ref 0.0–0.1)
BASOS PCT: 0 %
EOS ABS: 0 10*3/uL (ref 0.0–0.7)
Eosinophils Relative: 0 %
HCT: 47.1 % — ABNORMAL HIGH (ref 36.0–46.0)
HEMOGLOBIN: 15.1 g/dL — AB (ref 12.0–15.0)
Lymphocytes Relative: 13 %
Lymphs Abs: 0.9 10*3/uL (ref 0.7–4.0)
MCH: 32.3 pg (ref 26.0–34.0)
MCHC: 32.1 g/dL (ref 30.0–36.0)
MCV: 100.6 fL — ABNORMAL HIGH (ref 78.0–100.0)
Monocytes Absolute: 0.5 10*3/uL (ref 0.1–1.0)
Monocytes Relative: 8 %
NEUTROS PCT: 79 %
Neutro Abs: 5.1 10*3/uL (ref 1.7–7.7)
Platelets: 333 10*3/uL (ref 150–400)
RBC: 4.68 MIL/uL (ref 3.87–5.11)
RDW: 13.2 % (ref 11.5–15.5)
WBC: 6.6 10*3/uL (ref 4.0–10.5)

## 2016-10-12 LAB — PROTIME-INR
INR: 1.1
PROTHROMBIN TIME: 14.3 s (ref 11.4–15.2)

## 2016-10-12 LAB — COMPREHENSIVE METABOLIC PANEL
ALT: 22 U/L (ref 14–54)
AST: 41 U/L (ref 15–41)
Albumin: 2.6 g/dL — ABNORMAL LOW (ref 3.5–5.0)
Alkaline Phosphatase: 125 U/L (ref 38–126)
Anion gap: 10 (ref 5–15)
CHLORIDE: 97 mmol/L — AB (ref 101–111)
CO2: 33 mmol/L — ABNORMAL HIGH (ref 22–32)
Calcium: 7.8 mg/dL — ABNORMAL LOW (ref 8.9–10.3)
Creatinine, Ser: 0.71 mg/dL (ref 0.44–1.00)
GFR calc Af Amer: >60 mL/min (ref >60)
Glucose, Bld: 137 mg/dL — ABNORMAL HIGH (ref 65–99)
POTASSIUM: 2.9 mmol/L — AB (ref 3.5–5.1)
Sodium: 140 mmol/L (ref 135–145)
Total Bilirubin: 0.6 mg/dL (ref 0.3–1.2)
Total Protein: 6.5 g/dL (ref 6.5–8.1)

## 2016-10-12 LAB — URINALYSIS, ROUTINE W REFLEX MICROSCOPIC
BILIRUBIN URINE: NEGATIVE
Glucose, UA: NEGATIVE mg/dL
KETONES UR: 5 mg/dL — AB
Leukocytes, UA: NEGATIVE
NITRITE: POSITIVE — AB
PROTEIN: NEGATIVE mg/dL
Specific Gravity, Urine: 1.011 (ref 1.005–1.030)
pH: 7 (ref 5.0–8.0)

## 2016-10-12 LAB — TROPONIN I

## 2016-10-12 MED ORDER — POTASSIUM CHLORIDE CRYS ER 20 MEQ PO TBCR
20.0000 meq | EXTENDED_RELEASE_TABLET | Freq: Once | ORAL | Status: AC
  Administered 2016-10-12: 20 meq via ORAL
  Filled 2016-10-12: qty 1

## 2016-10-12 MED ORDER — ONDANSETRON 4 MG PO TBDP: 4.0000 mg | ORAL_TABLET | Freq: Three times a day (TID) | ORAL | 0 refills | Status: DC | PRN

## 2016-10-12 MED ORDER — ONDANSETRON HCL 4 MG/2ML IJ SOLN
4.0000 mg | Freq: Once | INTRAMUSCULAR | Status: AC
  Administered 2016-10-12: 4 mg via INTRAVENOUS
  Filled 2016-10-12: qty 2

## 2016-10-12 MED ORDER — SODIUM CHLORIDE 0.9 % IV BOLUS (SEPSIS)
1000.0000 mL | Freq: Once | INTRAVENOUS | Status: AC
  Administered 2016-10-12: 1000 mL via INTRAVENOUS

## 2016-10-12 MED ORDER — POTASSIUM CHLORIDE ER 10 MEQ PO TBCR: 10.0000 meq | EXTENDED_RELEASE_TABLET | Freq: Every day | ORAL | 0 refills | Status: DC

## 2016-10-12 MED ORDER — ONDANSETRON 4 MG PO TBDP
4.0000 mg | ORAL_TABLET | Freq: Once | ORAL | Status: AC
  Administered 2016-10-12: 4 mg via ORAL
  Filled 2016-10-12: qty 1

## 2016-10-12 NOTE — ED Provider Notes (Signed)
[facility] DEPT Provider Note   CSN: [id number] Arrival date & time: 10/12/16  0103     History   Chief Complaint Chief Complaint  Patient presents with  . Emesis    HPI [name] is a 51 y.o. female.   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 Review of the medical record shows that the patient has had multiple admissions to the hospital over the last 5 years, some of these are for altered mental status, as well as for an accidental overdose, as well as a variety of other complaints.  She reports that over the last 2 weeks she has had episodes of daily vomiting which occurs 1 or 2 times in the morning however she has been normal throughout the day without any nausea or abdominal pain or any other symptoms and has been able to eat and drink without difficulty. Over the last 24 hours the patient has had multiple episodes of vomiting totally more than 10 times, she has also had one episode of loose stools but denies any headaches, blurred vision, back pain, chest pain, abdominal pain, swelling, rashes, numbness or weakness other than the chronic pain in her feet from her chronic peripheral neuropathy. She has not been seen by a physician for this complaint as to this time.  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  Past Medical History:  Diagnosis Date  . Anxiety   . Arthritis    "hands & feet" (02/11/2013)  . Ataxia   . B12 deficiency anemia    [name] 02/11/2013  . Borderline diabetes   . Chronic bronchitis ([place])    "yearly" (02/11/2013)  . Chronic edema    BLE/notes 02/11/2013  . Chronic low back pain   . Chronic pain syndrome    [name] 02/11/2013  . COPD (chronic obstructive pulmonary  disease) ([place])   . Depression   . Eating disorder   . Fibromyalgia   . GERD (gastroesophageal reflux disease)   . 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 Hypertension   . Impaired fasting glucose   . Migraine    "I use to" (02/11/2013)  . MS (multiple sclerosis) ([place])   . Muscle weakness of lower extremity    bilateral  . Peripheral neuropathy ([place])    [name] 02/11/2013  . Peripheral neuropathy ([place])   . PONV (postoperative nausea and vomiting)   . UTI (lower urinary tract infection) 02/11/2013   [name] 02/11/2013    Patient Active Problem List   Diagnosis Date Noted  . Major depression 03/31/2016  . Nocturnal hypoxemia 06/17/2015  . Primary hypercoagulable state ([place]) 03/19/2015  . Chronic respiratory failure ([place]) 03/19/2015  . Insomnia 02/02/2015  . MRSA pneumonia ([place]) 10/11/2014  . Abdominal abscess ([place])   . Candidemia ([place])   . Screen for STD (sexually transmitted disease)   . 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 Perforated gastric ulcer ([place]) 09/27/2014  . Prediabetes 07/23/2014  . Hereditary and idiopathic peripheral neuropathy 04/15/2014  . CVA (cerebral infarction) 04/02/2014  . Hypertriglyceridemia 12/11/2013  . Stiffness of joint, not elsewhere classified, ankle and foot 11/12/2013  . Lack of coordination 11/03/2013  . Pernicious anemia 09/12/2013  . Elevated transaminase level 09/12/2013  .  Ataxia 09/09/2013  . Difficulty walking 09/09/2013  . Weakness of both legs 09/09/2013  . Generalized weakness 02/11/2013  . Tobacco abuse 01/14/2013  . Pedal edema 01/13/2013  . Chronic pain syndrome 12/30/2012  . Reactive airway disease 12/30/2012    Past Surgical History:  Procedure Laterality Date  . ABDOMINAL HYSTERECTOMY  ~ 1987; ~ 2004   "[place]; [place]" (02/11/2013)  . ANTERIOR CERVICAL DECOMP/DISCECTOMY FUSION  2009; 2011  . APPENDECTOMY    . CATARACT EXTRACTION W/PHACO  06/20/2011   Procedure: CATARACT EXTRACTION  PHACO AND INTRAOCULAR LENS PLACEMENT (IOC);  Surgeon: [name] T. Gershon Crane;  Location: [facility];  Service: Ophthalmology;  Laterality: Left;  CDE 1.81  . CATARACT EXTRACTION W/PHACO  07/04/2011   Procedure: CATARACT EXTRACTION PHACO AND INTRAOCULAR LENS PLACEMENT (IOC);  Surgeon: [name] T. Gershon Crane;  Location: [facility];  Service: Ophthalmology;  Laterality: Right;  CDE: 1.76  . CESAREAN SECTION  1982; 1984; 1986  . EMBOLECTOMY Left 09/27/2014   Procedure: Left Brachial, Radial,Ulnar Embolectomy with patch angioplasty left brachial, radial and ulnar artery;  Surgeon: [name], MD;  Location: [facility];  Service: Vascular;  Laterality: Left;  . EMBOLECTOMY Left 09/27/2014   Procedure: Left Radial, Brachial, and Ulnar Thrombectomy; Left Brachial to Radial Bypass Graft using Greater Saphenous vein graft from Left Thigh; Left Saphenous Vein Harvest; Intraoperative Arteriogram; Intra-arterial administration of TPA;  Surgeon: [name], MD;  Location: [facility];  Service: Vascular;  Laterality: Left;  . LAPAROTOMY N/A 09/27/2014   Procedure: Exploratory Laparotomy, Biopsy of Perforated Gastric Ulcer, Closure with omental patch;  Surgeon: [name], MD;  Location: [facility];  Service: General;  Laterality: N/A;  . TONSILLECTOMY  1990's?  . TRACHEOSTOMY N/A december 2015  . YAG LASER APPLICATION Right [id number]   Procedure: YAG LASER APPLICATION;  Surgeon: [name] T. Gershon Crane, MD;  Location: [facility];  Service: Ophthalmology;  Laterality: Right;  . YAG LASER APPLICATION Left [id number]   Procedure: YAG LASER APPLICATION;  Surgeon: [name] T. Gershon Crane, MD;  Location: [facility];  Service: Ophthalmology;  Laterality: Left;    OB History    No data available       Home Medications    Prior to Admission medications   Medication Sig Start Date End Date Taking? Authorizing Provider  ACCU-CHEK FASTCLIX LANCETS MISC USE TO TEST ONCE DAILY. 09/18/16   Kathyrn Drown, MD  albuterol (PROVENTIL HFA;VENTOLIN HFA) 108 (90 BASE)  MCG/ACT inhaler Inhale 2 puffs into the lungs every 6 (six) hours as needed for wheezing. 09/17/15   Kathyrn Drown, MD  DULoxetine (CYMBALTA) 20 MG capsule TAKE 2 CAPSULES BY MOUTH DAILY. 08/03/16   Kathyrn Drown, MD  ELIQUIS 5 MG TABS tablet TAKE 1 TABLET BY MOUTH TWICE DAILY. 08/03/16   Kathyrn Drown, MD  folic acid (FOLVITE) 1 MG tablet TAKE 1 TABLET BY MOUTH ONCE DAILY. 08/03/16   Kathyrn Drown, MD  furosemide (LASIX) 20 MG tablet TAKE 1 TABLET BY MOUTH EVERY MORNING. 04/05/16   Kathyrn Drown, MD  gabapentin (NEURONTIN) 300 MG capsule TAKE 2 CAPSULES BY MOUTH IN THE AM, 2 CAPSULES AT LUNCH, 1 CAPSULE ATSUPPER AND 2 CAPSULES AT BEDTIME. 08/17/16   Kathyrn Drown, MD  hydrocortisone 2.5 % cream Apply topically 2 (two) times daily. 03/19/15   Kathyrn Drown, MD  metFORMIN (GLUCOPHAGE) 500 MG tablet TAKE 1 TABLET BY MOUTH TWICE DAILY. 06/13/16   Kathyrn Drown, MD  ondansetron (ZOFRAN ODT) 4 MG disintegrating tablet Take  1 tablet (4 mg total) by mouth every 8 (eight) hours as needed for nausea. 10/12/16   Noemi Chapel, MD  ONE TOUCH ULTRA TEST test strip USE TO TEST ONCE DAILY. 09/18/16   Kathyrn Drown, MD  oxyCODONE-acetaminophen (PERCOCET) 7.5-325 MG tablet Take 1 tablet by mouth every 8 (eight) hours as needed for severe pain. 09/21/16   Kathyrn Drown, MD  pantoprazole (PROTONIX) 40 MG tablet TAKE 1 TABLET BY MOUTH ONCE DAILY. 09/04/16   Kathyrn Drown, MD  polyethylene glycol powder (GLYCOLAX/MIRALAX) powder One cap in a glass of water 12/09/15   Kathyrn Drown, MD  potassium chloride (K-DUR) 10 MEQ tablet Take 1 tablet (10 mEq total) by mouth daily. 10/12/16   Noemi Chapel, MD  senna (SENOKOT) 8.6 MG tablet Take 1 tablet (8.6 mg total) by mouth 2 (two) times daily. 12/10/14   Kathyrn Drown, MD  Thiamine HCl (VITAMIN B-1 PO) Take by mouth daily.    Historical Provider, MD  traZODone (DESYREL) 100 MG tablet TAKE 2 TABLETS BY MOUTH AT BEDTIME. 08/17/16   Kathyrn Drown, MD  vitamin B-12 (CYANOCOBALAMIN)  1000 MCG tablet Take 1,000 mcg by mouth daily.    Historical Provider, MD    Family History Family History  Problem Relation Age of Onset  . Hyperlipidemia Mother   . Heart disease Mother   . Cancer Mother     lung  . Diabetes Father   . Heart disease Father   . Cancer Maternal Grandmother     breast  . Anesthesia problems Neg Hx   . Hypotension Neg Hx   . Malignant hyperthermia Neg Hx   . Pseudochol deficiency Neg Hx     Social History Social History  Substance Use Topics  . Smoking status: Former Smoker    Packs/day: 1.00    Years: 33.00    Types: Cigarettes  . Smokeless tobacco: Former Systems developer    Quit date: 09/08/2014  . Alcohol use No     Allergies   Ambien [zolpidem tartrate]; Ceftin [cefuroxime axetil]; Hctz [hydrochlorothiazide]; Lodine [etodolac]; Promethazine; Codeine; Latex; and Penicillins   Review of Systems Review of Systems  Gastrointestinal: Positive for vomiting.  All other systems reviewed and are negative.    Physical Exam Updated Vital Signs BP 117/83 (BP Location: Right Wrist)   Pulse 107   Temp 98.9 F (37.2 C)   Resp 23   Ht 4\' 9"  (1.448 m)   Wt 165 lb (74.8 kg)   SpO2 94%   BMI 35.71 kg/m   Physical Exam  Constitutional: She appears well-developed and well-nourished. No distress.  HENT:  Head: Normocephalic and atraumatic.  Mouth/Throat: No oropharyngeal exudate.  Dry mucous membranes  Eyes: Conjunctivae and EOM are normal. Pupils are equal, round, and reactive to light. Right eye exhibits no discharge. Left eye exhibits no discharge. No scleral icterus.  Neck: Normal range of motion. Neck supple. No JVD present. No thyromegaly present.  Cardiovascular: Normal rate, regular rhythm, normal heart sounds and intact distal pulses.  Exam reveals no gallop and no friction rub.   No murmur heard. No murmurs, normal pulses, no edema or JVD  Pulmonary/Chest: Effort normal and breath sounds normal. No respiratory distress. She has no  wheezes. She has no rales.  Abdominal: Soft. Bowel sounds are normal. She exhibits no distension and no mass. There is no tenderness.  Musculoskeletal: Normal range of motion. She exhibits no edema or tenderness.  Lymphadenopathy:    She has no cervical  adenopathy.  Neurological: She is alert. Coordination normal.  Generalized weakness of all 4 extremities, there is some difficulty with expressive aphasia but she is able to say what she wants to say though it is slightly slurred and slightly slower than expected. Cranial nerves III through XII appear normal otherwise without any facial droop, no abnormal extraocular movements and normal pupillary exam. She is able to follow my commands when asked with minimal difficulty  Skin: Skin is warm and dry. No rash noted. No erythema.  Psychiatric: She has a normal mood and affect. Her behavior is normal.  Nursing note and vitals reviewed.    ED Treatments / Results  Labs (all labs ordered are listed, but only abnormal results are displayed) Labs Reviewed  CBC WITH DIFFERENTIAL/PLATELET - Abnormal; Notable for the following:       Result Value   Hemoglobin 15.1 (*)    HCT 47.1 (*)    MCV 100.6 (*)    All other components within normal limits  COMPREHENSIVE METABOLIC PANEL - Abnormal; Notable for the following:    Potassium 2.9 (*)    Chloride 97 (*)    CO2 33 (*)    Glucose, Bld 137 (*)    BUN <5 (*)    Calcium 7.8 (*)    Albumin 2.6 (*)    All other components within normal limits  URINALYSIS, ROUTINE W REFLEX MICROSCOPIC - Abnormal; Notable for the following:    Hgb urine dipstick SMALL (*)    Ketones, ur 5 (*)    Nitrite POSITIVE (*)    Bacteria, UA FEW (*)    All other components within normal limits  URINE CULTURE  PROTIME-INR  TROPONIN I    EKG  EKG Interpretation  Date/Time:  Thursday October 12 2016 02:12:53 EST Ventricular Rate:  109 PR Interval:    QRS Duration: 66 QT Interval:  353 QTC Calculation: 476 R  Axis:   -11 Text Interpretation:  Sinus tachycardia Borderline repolarization abnormality Since last tracing T wave abnormality now prsent - flattening Confirmed by Sabra Heck  MD, Avo Schlachter ([id number]) on 10/12/2016 3:59:38 AM       Radiology Ct Head Wo Contrast  Result Date: 10/12/2016 CLINICAL DATA:  Vomiting every day for 2 weeks, worse today. History of stroke, multiple sclerosis, migraines. EXAM: CT HEAD WITHOUT CONTRAST TECHNIQUE: Contiguous axial images were obtained from the base of the skull through the vertex without intravenous contrast. COMPARISON:  CT HEAD October 08, 2014 FINDINGS: BRAIN: No intraparenchymal hemorrhage, mass effect, midline shift or acute large vascular territory infarcts. Moderate ventriculomegaly on the basis of global parenchymal brain volume loss. Symmetric globus pallidus hypodensities. Patchy white matter hypodensities. Mild symmetric frontal cortex encephalomalacia at the convexity versus artifact. VASCULAR: Unremarkable. SKULL/SOFT TISSUES: No skull fracture. No significant soft tissue swelling. ORBITS/SINUSES: The included ocular globes and orbital contents are normal. Status post bilateral ocular lens implants. No abnormal extra-axial fluid collections. Basal cisterns are patent.The mastoid aircells and included paranasal sinuses are well-aerated. OTHER: None. IMPRESSION: No acute intracranial process. Moderate global parenchymal brain volume loss, advanced for age and from prior study. Symmetric focal frontal cortex atrophy versus artifact. Symmetric globus pallidus infarcts, this can be seen with prior toxic or metabolic insult. Electronically Signed   By: Elon Alas M.D.   On: 10/12/2016 01:50    Procedures Procedures (including critical care time)  Medications Ordered in ED Medications  potassium chloride SA (K-DUR,KLOR-CON) CR tablet 20 mEq (not administered)  ondansetron (ZOFRAN-ODT) disintegrating tablet 4 mg (  not administered)  ondansetron (ZOFRAN)  injection 4 mg (4 mg Intravenous Given 10/12/16 0206)  sodium chloride 0.9 % bolus 1,000 mL (0 mLs Intravenous Stopped 10/12/16 0409)     Initial Impression / Assessment and Plan / ED Course  I have reviewed the triage vital signs and the nursing notes.  Pertinent labs & imaging results that were available during my care of the patient were reviewed by me and considered in my medical decision making (see chart for details).  Clinical Course     The patient does not appear to be taking any medications which would require measuring levels, I fear that she is dehydrated given the appearance of her mucous membranes.  We'll give hydration, check labs, urinalysis, EKG, CT scan of the brain. The patient is anticoagulated on Eliquis, intracranial hemorrhage could be leading to increased intracranial pressure and nausea.  Labs unremarkable other than K, Imaging reviewed, no new findings UA reviewed - some dehdyration fluis and K replacement given Stable for d/c   Final Clinical Impressions(s) / ED Diagnoses   Final diagnoses:  Nausea and vomiting, intractability of vomiting not specified, unspecified vomiting type  Hypokalemia    New Prescriptions New Prescriptions   ONDANSETRON (ZOFRAN ODT) 4 MG DISINTEGRATING TABLET    Take 1 tablet (4 mg total) by mouth every 8 (eight) hours as needed for nausea.   POTASSIUM CHLORIDE (K-DUR) 10 MEQ TABLET    Take 1 tablet (10 mEq total) by mouth daily.     Noemi Chapel, MD 10/12/16 [phone]

## 2016-10-12 NOTE — ED Notes (Signed)
Pt wheeled to car. Pt verbalized understanding of discharge instructions.  

## 2016-10-12 NOTE — ED Notes (Signed)
Patient transported to CT 

## 2016-10-12 NOTE — ED Triage Notes (Signed)
Pt c/o vomiting x 2 weeks every morning but today has been vomiting all day.

## 2016-10-14 LAB — URINE CULTURE

## 2016-10-15 ENCOUNTER — Telehealth: Payer: Self-pay

## 2016-10-15 NOTE — Telephone Encounter (Signed)
F/U on symptoms from ED visit 10/12/16/ No further nausea of Urinary problems. Md appt tomorrow to follow up. No abx needed at this time since not having problems,per Salome Arnt Pharm D

## 2016-10-15 NOTE — Progress Notes (Signed)
ED Antimicrobial Stewardship Positive Culture Follow Up   Kelsee Skeete is an 51 y.o. female who presented to [facility] on 10/12/2016 with a chief complaint of  Chief Complaint  Patient presents with  . Emesis    Recent Results (from the past 720 hour(s))  Urine culture     Status: Abnormal   Collection Time: 10/12/16  3:55 AM  Result Value Ref Range Status   Specimen Description URINE, CLEAN CATCH  Final   Special Requests NONE  Final   Culture >=100,000 COLONIES/mL KLEBSIELLA PNEUMONIAE (A)  Final   Report Status 10/14/2016 FINAL  Final   Organism ID, Bacteria KLEBSIELLA PNEUMONIAE (A)  Final      Susceptibility   Klebsiella pneumoniae - MIC*    AMPICILLIN >=32 RESISTANT Resistant     CEFAZOLIN <=4 SENSITIVE Sensitive     CEFTRIAXONE <=1 SENSITIVE Sensitive     CIPROFLOXACIN <=0.25 SENSITIVE Sensitive     GENTAMICIN <=1 SENSITIVE Sensitive     IMIPENEM <=0.25 SENSITIVE Sensitive     NITROFURANTOIN 64 INTERMEDIATE Intermediate     TRIMETH/SULFA <=20 SENSITIVE Sensitive     AMPICILLIN/SULBACTAM 4 SENSITIVE Sensitive     PIP/TAZO <=4 SENSITIVE Sensitive     Extended ESBL NEGATIVE Sensitive     * >=100,000 COLONIES/mL KLEBSIELLA PNEUMONIAE    []  Treated with N/A, organism resistant to prescribed antimicrobial [x]  Patient discharged originally without antimicrobial agent and treatment is now indicated  New antibiotic prescription: If symptomatic, keflex 500mg  PO BID x 7 days  ED Provider: Arlean Hopping, PA   [place], Rande Lawman 10/15/2016, 10:16 AM Clinical Pharmacist Phone# [phone]

## 2016-10-16 ENCOUNTER — Ambulatory Visit (INDEPENDENT_AMBULATORY_CARE_PROVIDER_SITE_OTHER): Payer: Medicare Other | Admitting: Family Medicine

## 2016-10-16 ENCOUNTER — Encounter: Payer: Self-pay | Admitting: Family Medicine

## 2016-10-16 VITALS — BP 130/80 | Ht 59.0 in

## 2016-10-16 DIAGNOSIS — Z79899 Other long term (current) drug therapy: Secondary | ICD-10-CM | POA: Diagnosis not present

## 2016-10-16 DIAGNOSIS — E876 Hypokalemia: Secondary | ICD-10-CM | POA: Diagnosis not present

## 2016-10-16 DIAGNOSIS — E119 Type 2 diabetes mellitus without complications: Secondary | ICD-10-CM | POA: Diagnosis not present

## 2016-10-16 LAB — POCT GLYCOSYLATED HEMOGLOBIN (HGB A1C): HEMOGLOBIN A1C: 4.4

## 2016-10-16 MED ORDER — CIPROFLOXACIN HCL 250 MG PO TABS: 250.0000 mg | ORAL_TABLET | Freq: Two times a day (BID) | ORAL | 0 refills | Status: DC

## 2016-10-16 MED ORDER — POTASSIUM CHLORIDE ER 10 MEQ PO TBCR: 10.0000 meq | EXTENDED_RELEASE_TABLET | Freq: Every day | ORAL | 5 refills | Status: DC

## 2016-10-16 MED ORDER — APIXABAN 5 MG PO TABS: 5.0000 mg | ORAL_TABLET | Freq: Two times a day (BID) | ORAL | 5 refills | Status: DC

## 2016-10-16 NOTE — Progress Notes (Signed)
   Subjective:    Patient ID: [name], female    DOB: 1965-12-31, 51 y.o.   MRN: [id number]  HPI Patient is here today for a hospital ER follow up visit. Patient was seen at [facility] ER on 10/12/16 for nausea and vomiting. Patient states that she is no longer vomiting. Feeling better.  Patient needs some suave to put on her feet.  Patient has some bumps on her back also.   Review of Systems Patient denies any chest tightness pressure pain shortness breath nausea vomiting diarrhea vomiting has resolved    Objective:   Physical Exam  Lungs are clear hearts regular contractures noted on the arms and legs.      Assessment & Plan:  Unexplained vomiting has now resolved. If ongoing troubles with this the next step would be referral to gastroenterology  Neuropathy under good control husband will taper down on gabapentin  Recent hyponatremia they will repeat lab work within the next 10 days continue potassium. Use leg 6 only when necessary for swelling  A1c looks great. Metformin  Small bumps noted on the lower back does not appear to be infected will watch this currently  Neuropathy in the feet will try capsicin cream apply twice a day when necessary  Keep pain medication check up

## 2016-10-26 ENCOUNTER — Other Ambulatory Visit: Payer: Self-pay | Admitting: Family Medicine

## 2016-11-04 LAB — BASIC METABOLIC PANEL
BUN / CREAT RATIO: 6 — AB (ref 9–23)
BUN: 4 mg/dL — AB (ref 6–24)
CALCIUM: 8.6 mg/dL — AB (ref 8.7–10.2)
CHLORIDE: 102 mmol/L (ref 96–106)
CO2: 26 mmol/L (ref 18–29)
CREATININE: 0.63 mg/dL (ref 0.57–1.00)
GFR, EST AFRICAN AMERICAN: 120 mL/min/{1.73_m2} (ref >59)
GFR, EST NON AFRICAN AMERICAN: 104 mL/min/{1.73_m2} (ref >59)
Glucose: 97 mg/dL (ref 65–99)
Potassium: 4.8 mmol/L (ref 3.5–5.2)
Sodium: 145 mmol/L — ABNORMAL HIGH (ref 134–144)

## 2016-11-04 LAB — MAGNESIUM: Magnesium: 1.7 mg/dL (ref 1.6–2.3)

## 2016-11-05 ENCOUNTER — Encounter: Payer: Self-pay | Admitting: Family Medicine

## 2016-11-22 ENCOUNTER — Telehealth: Payer: Self-pay | Admitting: Family Medicine

## 2016-11-22 NOTE — Telephone Encounter (Signed)
Patient wants her potassium chloride 10 mg switched back to 2 instead 1.Stating she feels weak at times. Had to go to hospital when potassium got low.[place]

## 2016-11-22 NOTE — Telephone Encounter (Signed)
Please let the patient know that her potassium and magnesium on the last blood draw looked good. If she was on 1 potassium during that I believe that that would do fine to stick with one. If the patient is nervous about this I would recommend within a week to recheck her potassium with a metabolic 7

## 2016-11-22 NOTE — Telephone Encounter (Signed)
Left message to return call 

## 2016-11-22 NOTE — Telephone Encounter (Signed)
Potassium was 4.8 on blood work done 2 weeks ago (11/03/16)

## 2016-11-22 NOTE — Telephone Encounter (Signed)
Pt was taking 2 at the time of the blood draw. Do you want to keep her on 2

## 2016-11-24 NOTE — Telephone Encounter (Signed)
If the patient was taken to daily at the time of her tests I would stick with 2

## 2016-11-24 NOTE — Telephone Encounter (Signed)
Left message to return call 

## 2016-11-27 ENCOUNTER — Other Ambulatory Visit: Payer: Self-pay | Admitting: *Deleted

## 2016-11-27 MED ORDER — POTASSIUM CHLORIDE ER 10 MEQ PO TBCR: EXTENDED_RELEASE_TABLET | ORAL | 5 refills | Status: DC

## 2016-11-27 NOTE — Telephone Encounter (Signed)
Discussed with pt. New rx sent to pharm with directions to take 2 daily

## 2016-12-22 ENCOUNTER — Ambulatory Visit (INDEPENDENT_AMBULATORY_CARE_PROVIDER_SITE_OTHER): Payer: Medicare Other | Admitting: Family Medicine

## 2016-12-22 ENCOUNTER — Encounter: Payer: Self-pay | Admitting: Family Medicine

## 2016-12-22 VITALS — BP 96/70 | Ht 59.0 in

## 2016-12-22 DIAGNOSIS — G894 Chronic pain syndrome: Secondary | ICD-10-CM

## 2016-12-22 DIAGNOSIS — J4520 Mild intermittent asthma, uncomplicated: Secondary | ICD-10-CM

## 2016-12-22 DIAGNOSIS — D6859 Other primary thrombophilia: Secondary | ICD-10-CM

## 2016-12-22 MED ORDER — OXYCODONE-ACETAMINOPHEN 7.5-325 MG PO TABS: 1.0000 | ORAL_TABLET | Freq: Three times a day (TID) | ORAL | 0 refills | Status: DC | PRN

## 2016-12-22 MED ORDER — FUROSEMIDE 20 MG PO TABS
ORAL_TABLET | ORAL | 5 refills | Status: DC
Start: 2016-12-22 — End: 2017-06-25

## 2016-12-22 MED ORDER — TRAZODONE HCL 100 MG PO TABS: 200.0000 mg | ORAL_TABLET | Freq: Every day | ORAL | 6 refills | Status: DC

## 2016-12-22 MED ORDER — ALBUTEROL SULFATE HFA 108 (90 BASE) MCG/ACT IN AERS: 2.0000 | INHALATION_SPRAY | Freq: Four times a day (QID) | RESPIRATORY_TRACT | 2 refills | Status: DC | PRN

## 2016-12-22 MED ORDER — GABAPENTIN 300 MG PO CAPS: ORAL_CAPSULE | ORAL | 6 refills | Status: DC

## 2016-12-22 NOTE — Progress Notes (Signed)
Subjective:     Patient ID: [name], female   DOB: 07-May-1966, 51 y.o.   MRN: [id number]  HPI This patient was seen today for chronic pain  The medication list was reviewed and updated.   -Compliance with medication: Takes daily   - Number patient states they take daily: Patient states takes 3 per day.   -when was the last dose patient took? Last dose 1130 today   The patient was advised the importance of maintaining medication and not using illegal substances with these.  Refills needed: None  The patient was educated that we can provide 3 monthly scripts for their medication, it is their responsibility to follow the instructions.  Side effects or complications from medications: None   Patient is aware that pain medications are meant to minimize the severity of the pain to allow their pain levels to improve to allow for better function. They are aware of that pain medications cannot totally remove their pain.  Due for UDT ( at least once per year) :  Patient has concerns of bruising at injections sites, and bruising to left foot.         Review of Systems  Constitutional: Negative for activity change and appetite change.  Gastrointestinal: Negative for abdominal pain and vomiting.  Neurological: Negative for weakness.  Psychiatric/Behavioral: Negative for confusion.       Objective:   Physical Exam  Constitutional: She appears well-nourished. No distress.  HENT:  Head: Normocephalic.  Cardiovascular: Normal rate, regular rhythm and normal heart sounds.   No murmur heard. Pulmonary/Chest: Effort normal and breath sounds normal.  Musculoskeletal: She exhibits no edema.  Lymphadenopathy:    She has no cervical adenopathy.  Neurological: She is alert.  Psychiatric: Her behavior is normal.  Vitals reviewed. Some purpura noted on her arms from recent Botox injections     Assessment:     Chronic pain    Plan:     The patient was seen today as part of a  comprehensive visit regarding pain control. Patient's compliance with the medication as well as discussion regarding effectiveness was completed. Prescriptions were written. Patient was advised to follow-up in 3 months. The patient was assessed for any signs of severe side effects. The patient was advised to take the medicine as directed and to report to [place] if any side effect issues.  Patient is not interested in colonoscopy. She does not feel she would be able to do the test because of her condition Hemoccult cards ordered  Patient requesting refill on inhaler

## 2017-01-08 ENCOUNTER — Encounter (HOSPITAL_COMMUNITY): Payer: BLUE CROSS/BLUE SHIELD | Attending: Oncology | Admitting: Oncology

## 2017-01-08 ENCOUNTER — Ambulatory Visit (HOSPITAL_COMMUNITY): Payer: Self-pay | Admitting: Hematology & Oncology

## 2017-01-08 ENCOUNTER — Encounter (HOSPITAL_COMMUNITY): Payer: Self-pay | Admitting: Oncology

## 2017-01-08 DIAGNOSIS — D6851 Activated protein C resistance: Secondary | ICD-10-CM | POA: Diagnosis not present

## 2017-01-08 DIAGNOSIS — Z7901 Long term (current) use of anticoagulants: Secondary | ICD-10-CM | POA: Diagnosis not present

## 2017-01-08 HISTORY — DX: Activated protein C resistance: D68.51

## 2017-01-08 NOTE — Assessment & Plan Note (Addendum)
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  Unfortunately, she re-occluded requiring left brachial to left radial artery bypass graft with left leg greater saphenous vein. Ultrasound also showed thrombosis of the left subclavian artery. TEE was performed that was negative for source of emboli.  She is on anticoagulation with Eliquis.  Sister had similar clot issue and she was found to be Factor V leiden (homozygous). AND MS with limited functional status secondary to her disease.  She is currently on Eliquis  She needs to continue with anticoagulation. She is heterozygous for Factor V Leiden. Although not commonly associated with arterial thrombosis, I recommend ongoing lifelong Eliquis therapy especially given the serious nature of her thrombosis.   Previously labs reviewed with the patient.  I personally reviewed and went over laboratory results with the patient.  The results are noted within this dictation.  No hematology role for labs today.  Return in 12 months for follow-up.  I think we can see her annually for some time and then release her to her primary care provider unless issues arise in the future.

## 2017-01-08 NOTE — Progress Notes (Signed)
Tammy Lange, MD [place] [id number]  Factor V Leiden mutation, heterozygous [facility])  CURRENT THERAPY: Anticoagulation with Eliquis   INTERVAL HISTORY: [name] 51 y.o. 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   AND MS with limited functional status secondary to her disease.  She is doing well.  She is tolerating her anticoagulation without any issues with intolerance.  She reports compliance with the medication as well.    She denies any bleeding, blood in stool, black stool, hematuria, hemoptysis, etc.  She admits to easy bruising.  She reports that her sister had a recent blood clot requiring embolectomy, much like her.  She underwent genetic testing and was found to have Factor V Leiden as well.  Per patient, she is homozygous.  Review of Systems  Constitutional: Negative.  Negative for chills, fever and weight loss.  HENT: Negative.   Eyes: Negative.   Respiratory: Negative.  Negative for cough.   Cardiovascular: Negative.  Negative for chest pain.  Gastrointestinal: Negative.  Negative for blood in stool, constipation, diarrhea, melena, nausea and vomiting.  Genitourinary: Negative.   Musculoskeletal: Negative.   Skin: Negative.   Neurological: Negative.  Negative for weakness.  Endo/Heme/Allergies: Bruises/bleeds easily.  Psychiatric/Behavioral: Negative.     Past Medical History:  Diagnosis Date  . Anxiety   . Arthritis    "hands & feet" (02/11/2013)  . Ataxia   . B12 deficiency anemia    [name] 02/11/2013  . Borderline diabetes   . Chronic bronchitis ([place])    "yearly" (02/11/2013)  . Chronic edema    BLE/notes 02/11/2013  . Chronic  low back pain   . Chronic pain syndrome    [name] 02/11/2013  . COPD (chronic obstructive pulmonary disease) ([place])   . Depression   . Eating disorder   . Factor V Leiden mutation, heterozygous ([place]) 01/08/2017  . Fibromyalgia   . GERD (gastroesophageal reflux disease)   . 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 Hypertension   . Impaired fasting glucose   . Migraine    "I use to" (02/11/2013)  . MS (multiple sclerosis) ([place])   . Muscle weakness of lower extremity    bilateral  . Peripheral neuropathy ([place])    [name] 02/11/2013  . Peripheral neuropathy ([place])   . PONV (postoperative nausea and vomiting)   . UTI (lower urinary tract infection) 02/11/2013   [name] 02/11/2013    Past Surgical History:  Procedure Laterality Date  . ABDOMINAL HYSTERECTOMY  ~ 1987; ~ 2004   "[place]; [place]" (02/11/2013)  . ANTERIOR CERVICAL DECOMP/DISCECTOMY FUSION  2009; 2011  . APPENDECTOMY    . CATARACT EXTRACTION W/PHACO  06/20/2011   Procedure: CATARACT EXTRACTION PHACO AND INTRAOCULAR LENS PLACEMENT (IOC);  Surgeon: [name] T. Gershon Crane;  Location: [facility];  Service: Ophthalmology;  Laterality: Left;  CDE 1.81  . CATARACT EXTRACTION W/PHACO  07/04/2011   Procedure: CATARACT EXTRACTION PHACO AND INTRAOCULAR LENS PLACEMENT (IOC);  Surgeon: [name] T. Gershon Crane;  Location: [facility];  Service: Ophthalmology;  Laterality: Right;  CDE: 1.76  . CESAREAN SECTION  1982; 1984; 1986  . EMBOLECTOMY Left 09/27/2014   Procedure: Left Brachial, Radial,Ulnar Embolectomy with patch angioplasty left brachial, radial  and ulnar artery;  Surgeon: [name], MD;  Location: [place];  Service: Vascular;  Laterality: Left;  . EMBOLECTOMY Left 09/27/2014   Procedure: Left Radial, Brachial, and Ulnar Thrombectomy; Left Brachial to Radial Bypass Graft using Greater Saphenous vein graft from Left Thigh; Left Saphenous Vein Harvest; Intraoperative Arteriogram; Intra-arterial administration of TPA;  Surgeon: [name], MD;  Location: [place];  Service: Vascular;  Laterality: Left;  . LAPAROTOMY N/A 09/27/2014   Procedure: Exploratory Laparotomy, Biopsy of Perforated Gastric Ulcer, Closure with omental patch;  Surgeon: [name], MD;  Location: [place];  Service: General;  Laterality: N/A;  . TONSILLECTOMY  1990's?  . TRACHEOSTOMY N/A december 2015  . YAG LASER APPLICATION Right 01/18/8785   Procedure: YAG LASER APPLICATION;  Surgeon: [name] T. Gershon Crane, MD;  Location: [facility];  Service: Ophthalmology;  Laterality: Right;  . YAG LASER APPLICATION Left 7/67/2094   Procedure: YAG LASER APPLICATION;  Surgeon: [name] T. Gershon Crane, MD;  Location: [facility];  Service: Ophthalmology;  Laterality: Left;    Family History  Problem Relation Age of Onset  . Hyperlipidemia Mother   . Heart disease Mother   . Cancer Mother     lung  . Diabetes Father   . Heart disease Father   . Cancer Maternal Grandmother     breast  . Anesthesia problems Neg Hx   . Hypotension Neg Hx   . Malignant hyperthermia Neg Hx   . Pseudochol deficiency Neg Hx     Social History   Social History  . Marital status: Divorced    Spouse name: N/A  . Number of children: N/A  . Years of education: N/A   Social History Main Topics  . Smoking status: Former Smoker    Packs/day: 1.00    Years: 33.00    Types: Cigarettes  . Smokeless tobacco: Former Systems developer    Quit date: 09/08/2014  . Alcohol use No  . Drug use: No  . Sexual activity: Not Currently    Birth control/ protection: Surgical   Other Topics Concern  . Not on file   Social History Narrative  . No narrative on file     PHYSICAL EXAMINATION  ECOG PERFORMANCE STATUS: 2 - Symptomatic, <50% confined to bed  Vitals:   01/08/17 1437  BP: 117/86  Pulse: 70  Resp: 18    GENERAL:alert, no distress, well nourished, well developed, comfortable, cooperative, obese, smiling and in wheelchair, accompanied by boyfriend, [name]. SKIN: skin color, texture, turgor are normal, no  rashes or significant lesions HEAD: Normocephalic, No masses, lesions, tenderness or abnormalities EYES: normal, EOMI, Conjunctiva are pink and non-injected EARS: External ears normal OROPHARYNX:lips, buccal mucosa, and tongue normal and mucous membranes are moist  NECK: supple, no adenopathy, trachea midline LYMPH:  no palpable lymphadenopathy BREAST:not examined LUNGS: clear to auscultation  HEART: regular rate & rhythm ABDOMEN:abdomen soft and normal bowel sounds BACK: Back symmetric, no curvature. EXTREMITIES:less then 2 second capillary refill, no joint deformities, effusion, or inflammation, no skin discoloration, no cyanosis  NEURO: alert & oriented x 3 with fluent speech, no focal motor/sensory deficits, in wheelchair.   LABORATORY DATA: CBC    Component Value Date/Time   WBC 6.6 10/12/2016 0200   RBC 4.68 10/12/2016 0200   HGB 15.1 (H) 10/12/2016 0200   HCT 47.1 (H) 10/12/2016 0200   HCT 42.3 03/23/2016 1120   PLT 333 10/12/2016 0200   PLT 269 03/23/2016 1120   MCV 100.6 (H) 10/12/2016 0200  MCV 95 03/23/2016 1120   MCH 32.3 10/12/2016 0200   MCHC 32.1 10/12/2016 0200   RDW 13.2 10/12/2016 0200   RDW 13.8 03/23/2016 1120   LYMPHSABS 0.9 10/12/2016 0200   LYMPHSABS 3.0 03/23/2016 1120   MONOABS 0.5 10/12/2016 0200   EOSABS 0.0 10/12/2016 0200   EOSABS 0.1 03/23/2016 1120   BASOSABS 0.0 10/12/2016 0200   BASOSABS 0.0 03/23/2016 1120      Chemistry      Component Value Date/Time   NA 145 (H) 11/03/2016 1517   K 4.8 11/03/2016 1517   CL 102 11/03/2016 1517   CO2 26 11/03/2016 1517   BUN 4 (L) 11/03/2016 1517   CREATININE 0.63 11/03/2016 1517   CREATININE 0.80 12/08/2013 1313      Component Value Date/Time   CALCIUM 8.6 (L) 11/03/2016 1517   ALKPHOS 125 10/12/2016 0200   AST 41 10/12/2016 0200   ALT 22 10/12/2016 0200   BILITOT 0.6 10/12/2016 0200   BILITOT 0.3 03/23/2016 1120        PENDING LABS:   RADIOGRAPHIC STUDIES:  No results  found.   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  Unfortunately, she re-occluded requiring left brachial to left radial artery bypass graft with left leg greater saphenous vein. Ultrasound also showed thrombosis of the left subclavian artery. TEE was performed that was negative for source of emboli.  She is on anticoagulation with Eliquis.  Sister had similar clot issue and she was found to be Factor V leiden (homozygous). AND MS with limited functional status secondary to her disease.  She is currently on Eliquis  She needs to continue with anticoagulation. She is heterozygous for Factor V Leiden. Although not commonly associated with arterial thrombosis, I recommend ongoing lifelong Eliquis therapy especially given the serious nature of her thrombosis.   Previously labs reviewed with the patient.  I personally reviewed and went over laboratory results with the patient.  The results are noted within this dictation.  No hematology role for labs today.  Return in 12 months for follow-up.  I think we can see her annually for some time and then release her to her primary care provider unless issues arise in the future.    ORDERS PLACED FOR THIS ENCOUNTER: No orders of the defined types were placed in this encounter.   MEDICATIONS PRESCRIBED THIS ENCOUNTER: No orders of the defined types were placed in this encounter.   THERAPY PLAN:  Lifelong anticoagulation, currently on Eliquis.  All questions were answered. The patient knows to call the clinic with any problems, questions or concerns. We can certainly see the patient much sooner if  necessary.  Patient and plan discussed with Dr. Twana First and she is in agreement with the aforementioned.   This note is electronically signed by: Doy Mince 01/08/2017 2:47 PM

## 2017-01-08 NOTE — Patient Instructions (Addendum)
[facility] at [facility] Discharge Instructions  RECOMMENDATIONS MADE BY THE CONSULTANT AND ANY TEST RESULTS WILL BE SENT TO YOUR REFERRING PHYSICIAN.  You were seen today by Kirby Crigler PA-C. Return in 1 year for follow up.    Thank you for choosing [place] at [facility] to provide your oncology and hematology care.  To afford each patient quality time with our provider, please arrive at least 15 minutes before your scheduled appointment time.    If you have a lab appointment with the [facility] please come in thru the  Main Entrance and check in at the main information desk  You need to re-schedule your appointment should you arrive 10 or more minutes late.  We strive to give you quality time with our providers, and arriving late affects you and other patients whose appointments are after yours.  Also, if you no show three or more times for appointments you may be dismissed from the clinic at the providers discretion.     Again, thank you for choosing [facility].  Our hope is that these requests will decrease the amount of time that you wait before being seen by our physicians.       _____________________________________________________________  Should you have questions after your visit to [facility], please contact our office at [phone] between the hours of 8:30 a.m. and 4:30 p.m.  Voicemails left after 4:30 p.m. will not be returned until the following business day.  For prescription refill requests, have your pharmacy contact our office.       Resources For Cancer Patients and their Caregivers ? American Cancer Society: Can assist with transportation, wigs, general needs, runs Look Good Feel Better.        (651)487-4124 ? Cancer Care: Provides financial assistance, online support groups, medication/co-pay assistance.  1-800-813-HOPE (321)266-3241) ? [place] Assists  [place] cancer patients and their families through emotional , educational and financial support.  769-495-6460 ? [place] DSS Where to apply for food stamps, Medicaid and utility assistance. 671-128-1467 ? [facility]: Transportation to medical appointments. (252) 146-3672 ? Social Security Administration: May apply for disability if have a Stage IV cancer. 438-046-3971 228-398-4480 ? [employer], Disability and Transit Services: Assists with nutrition, care and transit needs. [place] Support Programs: @10RELATIVEDAYS @ > Cancer Support Group  2nd Tuesday of the month 1pm-2pm, Journey Room  > Creative Journey  3rd Tuesday of the month 1130am-1pm, Journey Room  > Look Good Feel Better  1st Wednesday of the month 10am-12 noon, Journey Room (Call [place] to register [phone])

## 2017-03-19 ENCOUNTER — Encounter: Payer: Self-pay | Admitting: Family Medicine

## 2017-03-19 NOTE — Telephone Encounter (Signed)
ERROR

## 2017-03-23 ENCOUNTER — Ambulatory Visit (INDEPENDENT_AMBULATORY_CARE_PROVIDER_SITE_OTHER): Payer: BLUE CROSS/BLUE SHIELD | Admitting: Family Medicine

## 2017-03-23 ENCOUNTER — Encounter: Payer: Self-pay | Admitting: Family Medicine

## 2017-03-23 VITALS — BP 108/70 | Ht 59.0 in

## 2017-03-23 DIAGNOSIS — E119 Type 2 diabetes mellitus without complications: Secondary | ICD-10-CM | POA: Diagnosis not present

## 2017-03-23 DIAGNOSIS — E039 Hypothyroidism, unspecified: Secondary | ICD-10-CM | POA: Diagnosis not present

## 2017-03-23 DIAGNOSIS — E876 Hypokalemia: Secondary | ICD-10-CM

## 2017-03-23 DIAGNOSIS — R635 Abnormal weight gain: Secondary | ICD-10-CM

## 2017-03-23 LAB — POCT GLYCOSYLATED HEMOGLOBIN (HGB A1C): HEMOGLOBIN A1C: 5.3

## 2017-03-23 MED ORDER — OXYCODONE-ACETAMINOPHEN 7.5-325 MG PO TABS: 1.0000 | ORAL_TABLET | Freq: Four times a day (QID) | ORAL | 0 refills | Status: DC | PRN

## 2017-03-23 NOTE — Progress Notes (Signed)
   Subjective:    Patient ID: [name], female    DOB: December 01, 1965, 51 y.o.   MRN: [id number]  Diabetes  She presents for her follow-up diabetic visit. She has type 2 diabetes mellitus. She has not had a previous visit with a dietitian. She does not see a podiatrist.Eye exam is not current.  She states she tries to eat somewhat healthy she know she does not do a perfect job she is not physically active she is wheelchair-bound Patient has concerns of dry skin to face, scalp.   Results for orders placed or performed in visit on 03/23/17  POCT HgB A1C  Result Value Ref Range   Hemoglobin A1C 5.3   With a history of hypokalemia recent ER visit Recent white blood cell elevation These need to be rechecked She has gained some weight She tries watch how she eats but it is impossible for her to exercise She also has reflux issue she takes medication for She does use trazodone at night to help her sleep This patient was seen today for chronic pain  The medication list was reviewed and updated.   -Compliance with medication: She is compliant  - Number patient states they take daily: She takes 3 per day  -when was the last dose patient took? Earlier today  The patient was advised the importance of maintaining medication and not using illegal substances with these.  Refills needed: She is hoping to have increase dosing  The patient was educated that we can provide 3 monthly scripts for their medication, it is their responsibility to follow the instructions.  Side effects or complications from medications: No side effects denies drowsiness  Patient is aware that pain medications are meant to minimize the severity of the pain to allow their pain levels to improve to allow for better function. They are aware of that pain medications cannot totally remove their pain.  Due for UDT ( at least once per year) : Taking care of on a yearly basis  She states 3 pain medicines per day helps does  not do enough to keep her pain under decent control in order to allow her to get up in wheelchair safely in be able to do some mild activity she needs better pain control          Review of Systems She denies chest tightness pressure pain shortness of breath she does relate fatigue she also relates facial dryness and some weight gain denies fever sweats denies joint pains does relate neuropathy in her feet    Objective:   Physical Exam Patient does appear that she's put on some weight Lungs are clear no crackles heart is regular Extremities trace edema Skin warm dry facial area is dry Patient complains of increased neuropathy pain in her feet She states the pain is such that were 3 pain pills per day does not do well enough She denies abusing the medicine Her husband make sure that she doesn't take more than 3 per day       Assessment & Plan:  Weight gain dry skin check thyroid function History hypokalemia check metabolic 7 Diabetes very important for the patient keeps things under control A1c looks good currently Chronic pain and discomfort with neuropathy in her feet she may use pain medicine up to 4 times per day drug registry was checked she does not abuse medicine lab work was ordered prescriptions given Reflux under good control

## 2017-03-24 LAB — T3: T3 TOTAL: 127 ng/dL (ref 71–180)

## 2017-03-24 LAB — BASIC METABOLIC PANEL
BUN/Creatinine Ratio: 14 (ref 9–23)
BUN: 15 mg/dL (ref 6–24)
CALCIUM: 8.8 mg/dL (ref 8.7–10.2)
CHLORIDE: 102 mmol/L (ref 96–106)
CO2: 29 mmol/L (ref 20–29)
Creatinine, Ser: 1.05 mg/dL — ABNORMAL HIGH (ref 0.57–1.00)
GFR, EST AFRICAN AMERICAN: 71 mL/min/{1.73_m2} (ref >59)
GFR, EST NON AFRICAN AMERICAN: 62 mL/min/{1.73_m2} (ref >59)
Glucose: 101 mg/dL — ABNORMAL HIGH (ref 65–99)
POTASSIUM: 4.4 mmol/L (ref 3.5–5.2)
Sodium: 145 mmol/L — ABNORMAL HIGH (ref 134–144)

## 2017-03-24 LAB — T4, FREE: FREE T4: 1.21 ng/dL (ref 0.82–1.77)

## 2017-03-24 LAB — TSH: TSH: 2.44 u[IU]/mL (ref 0.450–4.500)

## 2017-03-25 ENCOUNTER — Encounter: Payer: Self-pay | Admitting: Family Medicine

## 2017-03-26 NOTE — Progress Notes (Signed)
Spoke with patient and she stated that she was not requesting home health assistance.

## 2017-03-29 DIAGNOSIS — G8114 Spastic hemiplegia affecting left nondominant side: Secondary | ICD-10-CM | POA: Diagnosis not present

## 2017-03-29 DIAGNOSIS — G243 Spasmodic torticollis: Secondary | ICD-10-CM | POA: Diagnosis not present

## 2017-03-29 DIAGNOSIS — M24542 Contracture, left hand: Secondary | ICD-10-CM | POA: Diagnosis not present

## 2017-03-30 ENCOUNTER — Ambulatory Visit: Payer: Medicare Other | Admitting: Family Medicine

## 2017-04-06 ENCOUNTER — Other Ambulatory Visit: Payer: Self-pay | Admitting: Family Medicine

## 2017-04-09 ENCOUNTER — Other Ambulatory Visit: Payer: Self-pay | Admitting: Family Medicine

## 2017-05-01 ENCOUNTER — Other Ambulatory Visit: Payer: Self-pay | Admitting: Family Medicine

## 2017-05-02 NOTE — Telephone Encounter (Signed)
Last seen 03/23/17

## 2017-05-18 ENCOUNTER — Encounter: Payer: Self-pay | Admitting: Family Medicine

## 2017-05-18 ENCOUNTER — Ambulatory Visit (INDEPENDENT_AMBULATORY_CARE_PROVIDER_SITE_OTHER): Payer: BLUE CROSS/BLUE SHIELD | Admitting: Family Medicine

## 2017-05-18 VITALS — BP 108/70 | Temp 98.7°F | Ht 59.0 in

## 2017-05-18 DIAGNOSIS — B029 Zoster without complications: Secondary | ICD-10-CM

## 2017-05-18 MED ORDER — VALACYCLOVIR HCL 1 G PO TABS: 1000.0000 mg | ORAL_TABLET | Freq: Three times a day (TID) | ORAL | 0 refills | Status: DC

## 2017-05-18 NOTE — Progress Notes (Signed)
   Subjective:    Patient ID: [name], female    DOB: [birth year], 51 y.o.   MRN: [id number]  HPI Patient is here today states she thinks she has shingles. She states left side, back of neck. Top of head hurts radiating down to her right eye. She say she has had the shingles in the past.  Patient notes rash scalp. Did not see blisters but felt a couple crusty areas.  Having pain. At times sharp lancinating pain rising from neck, crosses gout. Reminds her very much of prior case of shingles.  Patient does have history of MS  In past took Valtrex for shingles and handled this well. Review of Systems No headache, no major weight loss or weight gain, no chest pain no back pain abdominal pain no change in bowel habits complete ROS otherwise negative     Objective:   Physical Exam  Alert vitals stable, NAD. Blood pressure good on repeat. HEENT normal. Lungs clear. Heart regular rate and rhythm. Scalp nonspecific crusty/scaly areas right superior and posterior scalp      Assessment & Plan:  Impression shingles versus neuropathic headache with nonspecific rash plan trial of Valtrex 1 g 3 times a day 7 days. Symptom care discussed local measures discussed

## 2017-05-21 ENCOUNTER — Telehealth: Payer: Self-pay | Admitting: Family Medicine

## 2017-05-21 MED ORDER — NYSTATIN 100000 UNIT/ML MT SUSP: OROMUCOSAL | 0 refills | Status: DC

## 2017-05-21 NOTE — Telephone Encounter (Signed)
Patient believes she has thrush and would like something called in.  She has a yellowish coating on her tongue.  [employer]

## 2017-05-21 NOTE — Telephone Encounter (Signed)
Spoke with patient's husband and informed him per Dr.[name] [name]- we sent in Nystatin to [facility]. Patient verbalized understanding.

## 2017-05-21 NOTE — Telephone Encounter (Signed)
Nystatin oral solution 1 teaspoon swish swallow 4 times a day for 7 days

## 2017-05-30 ENCOUNTER — Other Ambulatory Visit: Payer: Self-pay | Admitting: Family Medicine

## 2017-05-31 NOTE — Telephone Encounter (Signed)
Last seen 05/18/17

## 2017-05-31 NOTE — Telephone Encounter (Signed)
Name refill each 6

## 2017-06-07 ENCOUNTER — Other Ambulatory Visit: Payer: Self-pay | Admitting: Family Medicine

## 2017-06-22 ENCOUNTER — Ambulatory Visit: Payer: Medicare Other | Admitting: Family Medicine

## 2017-06-25 ENCOUNTER — Ambulatory Visit (INDEPENDENT_AMBULATORY_CARE_PROVIDER_SITE_OTHER): Payer: BLUE CROSS/BLUE SHIELD | Admitting: Family Medicine

## 2017-06-25 VITALS — BP 110/64 | Temp 98.1°F

## 2017-06-25 DIAGNOSIS — D6859 Other primary thrombophilia: Secondary | ICD-10-CM

## 2017-06-25 DIAGNOSIS — Z23 Encounter for immunization: Secondary | ICD-10-CM

## 2017-06-25 DIAGNOSIS — G609 Hereditary and idiopathic neuropathy, unspecified: Secondary | ICD-10-CM | POA: Diagnosis not present

## 2017-06-25 DIAGNOSIS — G4709 Other insomnia: Secondary | ICD-10-CM

## 2017-06-25 DIAGNOSIS — J019 Acute sinusitis, unspecified: Secondary | ICD-10-CM | POA: Diagnosis not present

## 2017-06-25 DIAGNOSIS — F324 Major depressive disorder, single episode, in partial remission: Secondary | ICD-10-CM

## 2017-06-25 DIAGNOSIS — E119 Type 2 diabetes mellitus without complications: Secondary | ICD-10-CM

## 2017-06-25 DIAGNOSIS — D6851 Activated protein C resistance: Secondary | ICD-10-CM | POA: Diagnosis not present

## 2017-06-25 LAB — POCT GLYCOSYLATED HEMOGLOBIN (HGB A1C): HEMOGLOBIN A1C: 6

## 2017-06-25 MED ORDER — GABAPENTIN 300 MG PO CAPS: ORAL_CAPSULE | ORAL | 6 refills | Status: DC

## 2017-06-25 MED ORDER — AZITHROMYCIN 250 MG PO TABS: ORAL_TABLET | ORAL | 0 refills | Status: DC

## 2017-06-25 MED ORDER — TRAZODONE HCL 100 MG PO TABS: 200.0000 mg | ORAL_TABLET | Freq: Every day | ORAL | 6 refills | Status: DC

## 2017-06-25 MED ORDER — OXYCODONE-ACETAMINOPHEN 7.5-325 MG PO TABS: 1.0000 | ORAL_TABLET | Freq: Four times a day (QID) | ORAL | 0 refills | Status: DC | PRN

## 2017-06-25 MED ORDER — ZOSTER VAC RECOMB ADJUVANTED 50 MCG/0.5ML IM SUSR: 0.5000 mL | Freq: Once | INTRAMUSCULAR | 1 refills | Status: AC

## 2017-06-25 MED ORDER — APIXABAN 5 MG PO TABS: ORAL_TABLET | ORAL | 1 refills | Status: DC

## 2017-06-25 MED ORDER — PANTOPRAZOLE SODIUM 40 MG PO TBEC: 40.0000 mg | DELAYED_RELEASE_TABLET | Freq: Every day | ORAL | 5 refills | Status: DC

## 2017-06-25 NOTE — Progress Notes (Signed)
   Subjective:    Patient ID: [name], female    DOB: 01-01-1966, 51 y.o.   MRN: [id number]  Diabetes  She presents for her follow-up diabetic visit. She has type 2 diabetes mellitus. Pertinent negatives for diabetes include no chest pain.  Is not currently on any antidiabetic medications. Pt is also here for follow up on pain management. Results for orders placed or performed in visit on 06/25/17  POCT glycosylated hemoglobin (Hb A1C)  Result Value Ref Range   Hemoglobin A1C 6.0   Reflux under good control with current medicines. Diabetes controlled by diet she was taken off of metformin Patient has chronic pain discomfort with neuropathy takes gabapentin name pain medicine denies abusing it no drowsiness. Patient is had pneumonia in the past is due for a booster on Prevnar 13+ also flu vaccine Patient is on blood thinner no bleeding complications    Review of Systems  Constitutional: Negative for activity change and fever.  HENT: Positive for congestion and rhinorrhea. Negative for ear pain.   Eyes: Negative for discharge.  Respiratory: Positive for cough. Negative for shortness of breath and wheezing.   Cardiovascular: Negative for chest pain.       Objective:   Physical Exam  Constitutional: She appears well-developed.  HENT:  Head: Normocephalic.  Right Ear: External ear normal.  Left Ear: External ear normal.  Nose: Nose normal.  Mouth/Throat: Oropharynx is clear and moist. No oropharyngeal exudate.  Eyes: Right eye exhibits no discharge. Left eye exhibits no discharge.  Neck: Neck supple. No tracheal deviation present.  Cardiovascular: Normal rate and normal heart sounds.   No murmur heard. Pulmonary/Chest: Effort normal and breath sounds normal. She has no wheezes. She has no rales.  Lymphadenopathy:    She has no cervical adenopathy.  Skin: Skin is warm and dry.  Nursing note and vitals reviewed.   25 minutes was spent with the patient. Greater than half  the time was spent in discussion and answering questions and counseling regarding the issues that the patient came in for today.  Patient is been treated for depression at times she feels down but other times she actually feels pretty good Ears are with wax lungs are clear no sign and pneumonia    Assessment & Plan:  Insomnia uses trazodone at night does well for her continue this Reflux issues medication does good job minimizing heartburn continue this Depression under good control with Cymbalta Significant neuropathy gabapentin tolerating well Chronic pain from neuropathic pain does not abuse medicine her husband make sure that she can only have 4 per day 3 prescriptions were written Patient unable to do urine drug screen because of being incontinent On next visit we will do pain management along with swab for compliance Lab work on next visit  Patient also with mild URI/sinusitis antibiotics prescribed warnings discuss  Has hypercoagulability under good control with Ledbetter no bleeding complications

## 2017-06-28 DIAGNOSIS — G243 Spasmodic torticollis: Secondary | ICD-10-CM | POA: Diagnosis not present

## 2017-06-28 DIAGNOSIS — M24542 Contracture, left hand: Secondary | ICD-10-CM | POA: Diagnosis not present

## 2017-06-28 DIAGNOSIS — G8114 Spastic hemiplegia affecting left nondominant side: Secondary | ICD-10-CM | POA: Diagnosis not present

## 2017-08-29 ENCOUNTER — Other Ambulatory Visit: Payer: Self-pay | Admitting: Family Medicine

## 2017-09-24 ENCOUNTER — Other Ambulatory Visit: Payer: Self-pay | Admitting: *Deleted

## 2017-09-24 ENCOUNTER — Telehealth: Payer: Self-pay | Admitting: Family Medicine

## 2017-09-24 MED ORDER — OXYCODONE-ACETAMINOPHEN 7.5-325 MG PO TABS: 1.0000 | ORAL_TABLET | Freq: Four times a day (QID) | ORAL | 0 refills | Status: DC | PRN

## 2017-09-24 NOTE — Telephone Encounter (Signed)
New prescription approved and signed by Dr Nicki Reaper. [facility] also called concerning this) Prescription up front for pick up- Driver from [facility] to pick up script.

## 2017-09-24 NOTE — Telephone Encounter (Signed)
Patients spouse took her Rx for oxycodone to [facility] last week and they misplaced it.  They are requesting a new Rx for this and they told [name] that if we will give them a new Rx, they will send the driver here to pick it up.

## 2017-09-28 ENCOUNTER — Ambulatory Visit: Payer: Medicare Other | Admitting: Family Medicine

## 2017-10-04 ENCOUNTER — Ambulatory Visit (INDEPENDENT_AMBULATORY_CARE_PROVIDER_SITE_OTHER): Payer: BLUE CROSS/BLUE SHIELD | Admitting: Family Medicine

## 2017-10-04 ENCOUNTER — Encounter: Payer: Self-pay | Admitting: Family Medicine

## 2017-10-04 VITALS — BP 112/72 | Ht 59.0 in

## 2017-10-04 DIAGNOSIS — G894 Chronic pain syndrome: Secondary | ICD-10-CM | POA: Diagnosis not present

## 2017-10-04 DIAGNOSIS — F324 Major depressive disorder, single episode, in partial remission: Secondary | ICD-10-CM | POA: Diagnosis not present

## 2017-10-04 MED ORDER — OXYCODONE-ACETAMINOPHEN 7.5-325 MG PO TABS: 1.0000 | ORAL_TABLET | Freq: Four times a day (QID) | ORAL | 0 refills | Status: DC | PRN

## 2017-10-04 MED ORDER — DULOXETINE HCL 60 MG PO CPEP: 60.0000 mg | ORAL_CAPSULE | Freq: Every day | ORAL | 12 refills | Status: DC

## 2017-10-04 NOTE — Progress Notes (Signed)
   Subjective:    Patient ID: [name], female    DOB: [birth year], 51 y.o.   MRN: [id number]  HPI  This patient was seen today for chronic pain  The medication list was reviewed and updated.   -Compliance with medication: oxycodone 7.5/325 mg  - Number patient states they take daily: 4 a day  -when was the last dose patient took? today  The patient was advised the importance of maintaining medication and not using illegal substances with these.  Refills needed: yes  The patient was educated that we can provide 3 monthly scripts for their medication, it is their responsibility to follow the instructions.  Side effects or complications from medications: none  Patient is aware that pain medications are meant to minimize the severity of the pain to allow their pain levels to improve to allow for better function. They are aware of that pain medications cannot totally remove their pain.  Due for UDT ( at least once per year) : unable to give urine sample due to incontinence.  Patient with c/o feet hurting   Review of Systems  Constitutional: Negative for activity change and appetite change.  HENT: Negative for congestion.   Respiratory: Negative for cough.   Cardiovascular: Negative for chest pain.  Gastrointestinal: Negative for abdominal pain and vomiting.  Skin: Negative for color change.  Neurological: Negative for weakness.  Psychiatric/Behavioral: Negative for confusion.       Objective:   Physical Exam  Constitutional: She appears well-nourished. No distress.  HENT:  Head: Normocephalic.  Right Ear: External ear normal.  Left Ear: External ear normal.  Eyes: Right eye exhibits no discharge. Left eye exhibits no discharge.  Neck: No tracheal deviation present.  Cardiovascular: Normal rate, regular rhythm and normal heart sounds.  No murmur heard. Pulmonary/Chest: Effort normal and breath sounds normal. No respiratory distress. She has no wheezes. She has no  rales.  Musculoskeletal: She exhibits no edema.  Lymphadenopathy:    She has no cervical adenopathy.  Neurological: She is alert.  Psychiatric: Her behavior is normal.  Vitals reviewed.  Patient also relates her depression is fair at times feeling sad and blue a lot of this stems  other times her husband tries his best he can plus also a caretaker helps her this makes it difficult for the patient in terms of her day-to-day activities limited because of her health issues Patient denies being suicidal      Assessment & Plan:  The patient was seen today as part of a comprehensive visit regarding pain control. Patient's compliance with the medication as well as discussion regarding effectiveness was completed. Prescriptions were written. Patient was advised to follow-up in 3 months. The patient was assessed for any signs of severe side effects. The patient was advised to take the medicine as directed and to report to [place] if any side effect issues. Drug registry was checked  The patient was seen today in followup for depression. Discussion was held regarding the importance of taking the medication. Discussion was also held regarding the importance of notifying us if becoming more depressed and certainly emergently notifying us or going to the emergency department if becoming suicidal.  Importance of social engagement was also discussed along with behavioral techniques to lessen depression. Regular followup visits were discussed as well. Patient medication was increased to 60 mg of Cymbalta  Comprehensive lab work on next visit

## 2017-10-05 ENCOUNTER — Other Ambulatory Visit: Payer: Self-pay | Admitting: Family Medicine

## 2017-10-10 ENCOUNTER — Other Ambulatory Visit: Payer: Self-pay | Admitting: Nurse Practitioner

## 2017-10-10 ENCOUNTER — Telehealth: Payer: Self-pay

## 2017-10-10 MED ORDER — DOXYCYCLINE HYCLATE 100 MG PO TABS: 100.0000 mg | ORAL_TABLET | Freq: Two times a day (BID) | ORAL | 0 refills | Status: DC

## 2017-10-10 NOTE — Telephone Encounter (Signed)
Antibiotic sent in to [name] (no specific pharmacy in note). Office visit if no better.

## 2017-10-10 NOTE — Telephone Encounter (Signed)
Patient boyfriend is aware .

## 2017-10-10 NOTE — Telephone Encounter (Signed)
Patient significant other [name]) called states, pt has problems getting around. She is in a wheel chair.She has had a sinus infection,with headache,sinus pressure,cough,sore throat for a few days now.He can not bring her in today. They want to know if we can call in something for her.Please advise.Thanks

## 2017-12-04 ENCOUNTER — Other Ambulatory Visit: Payer: Self-pay | Admitting: Family Medicine

## 2017-12-10 ENCOUNTER — Other Ambulatory Visit: Payer: Self-pay | Admitting: Family Medicine

## 2017-12-28 ENCOUNTER — Other Ambulatory Visit: Payer: Self-pay | Admitting: Family Medicine

## 2018-01-02 ENCOUNTER — Ambulatory Visit: Payer: Medicare Other | Admitting: Family Medicine

## 2018-01-04 ENCOUNTER — Ambulatory Visit (INDEPENDENT_AMBULATORY_CARE_PROVIDER_SITE_OTHER): Payer: BLUE CROSS/BLUE SHIELD | Admitting: Family Medicine

## 2018-01-04 ENCOUNTER — Encounter: Payer: Self-pay | Admitting: Family Medicine

## 2018-01-04 VITALS — BP 114/78 | Temp 98.1°F | Ht 59.0 in

## 2018-01-04 DIAGNOSIS — E781 Pure hyperglyceridemia: Secondary | ICD-10-CM | POA: Diagnosis not present

## 2018-01-04 DIAGNOSIS — G894 Chronic pain syndrome: Secondary | ICD-10-CM

## 2018-01-04 DIAGNOSIS — Z79899 Other long term (current) drug therapy: Secondary | ICD-10-CM | POA: Diagnosis not present

## 2018-01-04 DIAGNOSIS — D510 Vitamin B12 deficiency anemia due to intrinsic factor deficiency: Secondary | ICD-10-CM

## 2018-01-04 DIAGNOSIS — D6851 Activated protein C resistance: Secondary | ICD-10-CM | POA: Diagnosis not present

## 2018-01-04 DIAGNOSIS — H6123 Impacted cerumen, bilateral: Secondary | ICD-10-CM

## 2018-01-04 DIAGNOSIS — R740 Nonspecific elevation of levels of transaminase and lactic acid dehydrogenase [LDH]: Secondary | ICD-10-CM

## 2018-01-04 DIAGNOSIS — E119 Type 2 diabetes mellitus without complications: Secondary | ICD-10-CM

## 2018-01-04 DIAGNOSIS — Z1231 Encounter for screening mammogram for malignant neoplasm of breast: Secondary | ICD-10-CM

## 2018-01-04 DIAGNOSIS — Z1211 Encounter for screening for malignant neoplasm of colon: Secondary | ICD-10-CM | POA: Diagnosis not present

## 2018-01-04 DIAGNOSIS — D6859 Other primary thrombophilia: Secondary | ICD-10-CM

## 2018-01-04 DIAGNOSIS — R7401 Elevation of levels of liver transaminase levels: Secondary | ICD-10-CM

## 2018-01-04 LAB — POCT GLYCOSYLATED HEMOGLOBIN (HGB A1C): Hemoglobin A1C: 5.5

## 2018-01-04 MED ORDER — POTASSIUM CHLORIDE ER 10 MEQ PO TBCR: 20.0000 meq | EXTENDED_RELEASE_TABLET | Freq: Every day | ORAL | 6 refills | Status: DC

## 2018-01-04 MED ORDER — OXYCODONE-ACETAMINOPHEN 5-325 MG PO TABS: ORAL_TABLET | ORAL | 0 refills | Status: DC

## 2018-01-04 MED ORDER — GABAPENTIN 300 MG PO CAPS: ORAL_CAPSULE | ORAL | 6 refills | Status: DC

## 2018-01-04 MED ORDER — TRAZODONE HCL 100 MG PO TABS: 200.0000 mg | ORAL_TABLET | Freq: Every day | ORAL | 6 refills | Status: DC

## 2018-01-04 NOTE — Progress Notes (Signed)
Subjective:    Patient ID: [name], female    DOB: [birth year], 52 y.o.   MRN: [id number]  HPI This patient was seen today for chronic pain  The medication list was reviewed and updated.   -Compliance with medication: yes  - Number patient states they take daily: four a day  -when was the last dose patient took? today  The patient was advised the importance of maintaining medication and not using illegal substances with these.  Here for refills and follow up  The patient was educated that we can provide 3 monthly scripts for their medication, it is their responsibility to follow the instructions.  Side effects or complications from medications: none  Patient is aware that pain medications are meant to minimize the severity of the pain to allow their pain levels to improve to allow for better function. They are aware of that pain medications cannot totally remove their pain.  Due for UDT ( at least once per year) : not able to give urine sample. In wheelchair.   Bilateral ear pain. Several weeks.  Results for orders placed or performed in visit on 01/04/18  POCT glycosylated hemoglobin (Hb A1C)  Result Value Ref Range   Hemoglobin A1C 5.5    She keeps her diabetes under good control with diet alone.  She cannot exercise she is wheelchair-bound she is permanently disabled  Her depression is been under good control she denies being suicidal states the medication is helping her greatly  Her peripheral neuropathy she has significant neuropathy in her feet gabapentin does help with the pain she continues this  She does have chronic pain and discomfort for which she takes her pain medicine it does a good job keeping things under control she denies abusing it  She does have a hypercoagulability states she takes her medicine on a regular basis no bleeding issues  She does have fatty liver she knows the importance of keeping her weight down she needs to get her liver enzymes  rechecked  She is due for a mammogram she is put this off I have encouraged her to get it completed  She does have B12 deficiency for which she takes B12 orally to keep things under control check CBC         25 minutes was spent with the patient.  This statement verifies that 25 minutes was indeed spent with the patient. Greater than half the time was spent in discussion, counseling and answering questions  regarding the issues that the patient came in for today as reflected in the diagnosis (s) please refer to documentation for further details. Multiple issues were covered today significant time spent discussing pain medicine checking the drug registry discussing her neuropathy discussing gabapentin discussing depression discussing diabetes oral control as well as fatty liver plus also discussing her contractures her disability and the need for a mammogram in stool for blood  Review of Systems  Constitutional: Negative for activity change, appetite change, fatigue and fever.  HENT: Positive for ear pain. Negative for congestion and rhinorrhea.   Eyes: Negative for discharge.  Respiratory: Negative for cough, shortness of breath and wheezing.   Cardiovascular: Negative for chest pain.  Gastrointestinal: Negative for abdominal pain.  Endocrine: Negative for polydipsia and polyphagia.  Skin: Negative for color change.  Neurological: Negative for weakness.  Psychiatric/Behavioral: Negative for confusion.       Objective:   Physical Exam  Constitutional: She appears well-nourished. No distress.  HENT:  Head: Normocephalic.  Right Ear: External ear normal.  Left Ear: External ear normal.  Eyes: Right eye exhibits no discharge. Left eye exhibits no discharge.  Neck: No tracheal deviation present.  Cardiovascular: Normal rate, regular rhythm and normal heart sounds.  No murmur heard. Pulmonary/Chest: Effort normal and breath sounds normal. No respiratory distress. She has no wheezes.  She has no rales.  Musculoskeletal: She exhibits no edema.  Lymphadenopathy:    She has no cervical adenopathy.  Neurological: She is alert.  Psychiatric: Her behavior is normal.  Vitals reviewed.   Patient does have some pedal edema.  She also has some contractures.  She is we will chair bound      Assessment & Plan:  Diabetes mellitus without complication [facility]) *Patient doing a good job keeping things under control with diet - POCT glycosylated hemoglobin (Hb A1C)  2. Factor V Leiden mutation, heterozygous [facility]) She is on Eliquis long-term  3. Primary hypercoagulable state ([place]) No sign of any DVT going on no sign of any bleeding patient denies any excessive bruising or bleeding  4. Pernicious anemia Patient does take B12 oral supplements on a regular basis - CBC with Differential/Platelet  5. Elevated transaminase level Patient has history of fatty liver we will check liver enzymes - Hepatic function panel  6. Chronic pain syndrome Pain registry checked The patient was seen today as part of a comprehensive visit regarding pain control. Patient's compliance with the medication as well as discussion regarding effectiveness was completed. Prescriptions were written. Patient was advised to follow-up in 3 months. The patient was assessed for any signs of severe side effects. The patient was advised to take the medicine as directed and to report to [place] if any side effect issues.   7. Bilateral impacted cerumen Referral to ENT for removal - Ambulatory referral to ENT  8. Hypertriglyceridemia - Lipid panel Under good control with diet 9. High risk medication use Check kidney function with history of renal disease she cannot - Basic metabolic panel 10. Screen for colon cancer She cannot do colonoscopy check stool for blood - IFOBT POC (occult bld, rslt in office); Future  11. Encounter for screening mammogram for breast cancer referral for mammogram - MM DIGITAL SCREENING  BILATERAL the patient was made aware in the past that she needs to get this completed she has put this off

## 2018-01-05 LAB — LIPID PANEL
CHOL/HDL RATIO: 5.3 ratio — AB (ref 0.0–4.4)
Cholesterol, Total: 239 mg/dL — ABNORMAL HIGH (ref 100–199)
HDL: 45 mg/dL (ref >39)
LDL Calculated: 137 mg/dL — ABNORMAL HIGH (ref 0–99)
TRIGLYCERIDES: 287 mg/dL — AB (ref 0–149)
VLDL CHOLESTEROL CAL: 57 mg/dL — AB (ref 5–40)

## 2018-01-05 LAB — BASIC METABOLIC PANEL
BUN / CREAT RATIO: 9 (ref 9–23)
BUN: 7 mg/dL (ref 6–24)
CHLORIDE: 100 mmol/L (ref 96–106)
CO2: 33 mmol/L — ABNORMAL HIGH (ref 20–29)
Calcium: 9.1 mg/dL (ref 8.7–10.2)
Creatinine, Ser: 0.8 mg/dL (ref 0.57–1.00)
GFR calc Af Amer: 98 mL/min/{1.73_m2} (ref >59)
GFR calc non Af Amer: 85 mL/min/{1.73_m2} (ref >59)
GLUCOSE: 107 mg/dL — AB (ref 65–99)
Potassium: 5 mmol/L (ref 3.5–5.2)
SODIUM: 144 mmol/L (ref 134–144)

## 2018-01-05 LAB — CBC WITH DIFFERENTIAL/PLATELET
Basophils Absolute: 0 10*3/uL (ref 0.0–0.2)
Basos: 0 %
EOS (ABSOLUTE): 0.1 10*3/uL (ref 0.0–0.4)
EOS: 1 %
HEMATOCRIT: 43.8 % (ref 34.0–46.6)
Hemoglobin: 14.5 g/dL (ref 11.1–15.9)
IMMATURE GRANS (ABS): 0 10*3/uL (ref 0.0–0.1)
IMMATURE GRANULOCYTES: 0 %
LYMPHS: 44 %
Lymphocytes Absolute: 2 10*3/uL (ref 0.7–3.1)
MCH: 32.3 pg (ref 26.6–33.0)
MCHC: 33.1 g/dL (ref 31.5–35.7)
MCV: 98 fL — ABNORMAL HIGH (ref 79–97)
MONOS ABS: 0.4 10*3/uL (ref 0.1–0.9)
Monocytes: 8 %
NEUTROS PCT: 47 %
Neutrophils Absolute: 2.1 10*3/uL (ref 1.4–7.0)
PLATELETS: 226 10*3/uL (ref 150–379)
RBC: 4.49 x10E6/uL (ref 3.77–5.28)
RDW: 17 % — AB (ref 12.3–15.4)
WBC: 4.6 10*3/uL (ref 3.4–10.8)

## 2018-01-05 LAB — HEPATIC FUNCTION PANEL
ALK PHOS: 87 IU/L (ref 39–117)
ALT: 13 IU/L (ref 0–32)
AST: 15 IU/L (ref 0–40)
Albumin: 3.8 g/dL (ref 3.5–5.5)
BILIRUBIN, DIRECT: 0.09 mg/dL (ref 0.00–0.40)
Bilirubin Total: 0.3 mg/dL (ref 0.0–1.2)
Total Protein: 6.9 g/dL (ref 6.0–8.5)

## 2018-01-06 ENCOUNTER — Encounter: Payer: Self-pay | Admitting: Family Medicine

## 2018-01-10 ENCOUNTER — Encounter: Payer: Self-pay | Admitting: Family Medicine

## 2018-01-15 ENCOUNTER — Ambulatory Visit (HOSPITAL_COMMUNITY): Payer: Self-pay | Admitting: Hematology

## 2018-01-30 ENCOUNTER — Ambulatory Visit (HOSPITAL_COMMUNITY): Payer: Self-pay | Admitting: Hematology

## 2018-03-04 ENCOUNTER — Other Ambulatory Visit: Payer: Self-pay | Admitting: Family Medicine

## 2018-04-04 ENCOUNTER — Ambulatory Visit (INDEPENDENT_AMBULATORY_CARE_PROVIDER_SITE_OTHER): Payer: BLUE CROSS/BLUE SHIELD | Admitting: Family Medicine

## 2018-04-04 ENCOUNTER — Encounter: Payer: Self-pay | Admitting: Family Medicine

## 2018-04-04 VITALS — BP 112/72 | Ht 59.0 in

## 2018-04-04 DIAGNOSIS — R21 Rash and other nonspecific skin eruption: Secondary | ICD-10-CM | POA: Diagnosis not present

## 2018-04-04 DIAGNOSIS — E119 Type 2 diabetes mellitus without complications: Secondary | ICD-10-CM | POA: Diagnosis not present

## 2018-04-04 DIAGNOSIS — F320 Major depressive disorder, single episode, mild: Secondary | ICD-10-CM | POA: Diagnosis not present

## 2018-04-04 DIAGNOSIS — R002 Palpitations: Secondary | ICD-10-CM | POA: Diagnosis not present

## 2018-04-04 DIAGNOSIS — G4709 Other insomnia: Secondary | ICD-10-CM | POA: Diagnosis not present

## 2018-04-04 DIAGNOSIS — G894 Chronic pain syndrome: Secondary | ICD-10-CM | POA: Diagnosis not present

## 2018-04-04 LAB — POCT GLYCOSYLATED HEMOGLOBIN (HGB A1C): HEMOGLOBIN A1C: 5.8 % — AB (ref 4.0–5.6)

## 2018-04-04 MED ORDER — OXYCODONE-ACETAMINOPHEN 5-325 MG PO TABS: ORAL_TABLET | ORAL | 0 refills | Status: DC

## 2018-04-04 MED ORDER — DOXYCYCLINE HYCLATE 100 MG PO TABS: 100.0000 mg | ORAL_TABLET | Freq: Two times a day (BID) | ORAL | 0 refills | Status: DC

## 2018-04-04 MED ORDER — SERTRALINE HCL 100 MG PO TABS: ORAL_TABLET | ORAL | 5 refills | Status: DC

## 2018-04-04 NOTE — Progress Notes (Signed)
Subjective:    Patient ID: [name], female    DOB: September 18, 1966, 52 y.o.   MRN: [id number]  HPI This patient was seen today for chronic pain  The medication list was reviewed and updated.   -Compliance with medication: yes  - Number patient states they take daily: 6  -when was the last dose patient took? today  The patient was advised the importance of maintaining medication and not using illegal substances with these.  Here for refills and follow up  The patient was educated that we can provide 3 monthly scripts for their medication, it is their responsibility to follow the instructions.  Side effects or complications from medications: none  Patient is aware that pain medications are meant to minimize the severity of the pain to allow their pain levels to improve to allow for better function. They are aware of that pain medications cannot totally remove their pain.  Due for UDT ( at least once per year) : not able to due to being in wheelchair  Pt would like to get her antidepressant med increased.   Results for orders placed or performed in visit on 04/04/18  POCT glycosylated hemoglobin (Hb A1C)  Result Value Ref Range   Hemoglobin A1C 5.8 (A) 4.0 - 5.6 %   HbA1c POC (<> result, manual entry)  4.0 - 5.6 %   HbA1c, POC (prediabetic range)  5.7 - 6.4 %   HbA1c, POC (controlled diabetic range)  0.0 - 7.0 %       Review of Systems  Constitutional: Negative for activity change, appetite change and fatigue.  HENT: Negative for congestion and rhinorrhea.   Eyes: Negative for pain and redness.  Respiratory: Negative for cough and shortness of breath.   Cardiovascular: Negative for chest pain and leg swelling.  Gastrointestinal: Negative for abdominal pain, blood in stool and constipation.  Endocrine: Negative for polydipsia and polyphagia.  Genitourinary: Negative for flank pain and frequency.  Skin: Negative for color change.  Neurological: Negative for weakness.    Psychiatric/Behavioral: Negative for confusion.       Objective:   Physical Exam  Constitutional: She appears well-nourished. No distress.  HENT:  Head: Normocephalic and atraumatic.  Eyes: Right eye exhibits no discharge. Left eye exhibits no discharge.  Neck: No tracheal deviation present.  Cardiovascular: Normal rate, regular rhythm and normal heart sounds.  No murmur heard. Pulmonary/Chest: Effort normal and breath sounds normal. No stridor. No respiratory distress.  Musculoskeletal: She exhibits no edema.  Lymphadenopathy:    She has no cervical adenopathy.  Neurological: She is alert. She exhibits abnormal muscle tone. Coordination abnormal.  Skin: Skin is warm and dry.  Psychiatric: Her behavior is normal.  Vitals reviewed.         Assessment & Plan:  The patient was seen today as part of a comprehensive visit for diabetes. The importance of keeping her A1c at or below 7 was discussed.  Importance of regular physical activity was discussed.   The importance of adherence to medication as well as a controlled low starch/sugar diet was also discussed.  Standard follow-up visit recommended.  Also patient aware failure to keep diabetes under control increases the risk of complications.  She has a bump on her chest treated with doxycycline if it does not clear up to notify us we will set her up with dermatology  The patient was seen in followup for chronic pain. A review over at their current pain status was discussed. Drug registry was  checked. Prescriptions were given. Discussion was held regarding the importance of compliance with medication as well as pain medication contract.  Time for questions regarding pain management plan occurred. Importance of regular followup visits was discussed. Patient was informed that medication may cause drowsiness and should not be combined  with other medications/alcohol or street drugs. Patient was cautioned that medication could  cause drowsiness. If the patient feels medication is causing altered alertness then do not drive or operate dangerous equipment.  Patient does benefit from her pain medicine drug registry was checked  Patient does have insomnia uses medicine does help her with this  Moderate depression we will change in the presence unfortunately the patient is housebound 99% of the time because of her disability this does make it very difficult for her and contributes to her depression husband does well with her she does have a daytime sitter  25 minutes was spent with the patient.  This statement verifies that 25 minutes was indeed spent with the patient.  More than 50% of this visit-total duration of the visit-was spent in counseling and coordination of care. The issues that the patient came in for today as reflected in the diagnosis (s) please refer to documentation for further details. Significant amount of time spent regarding pain medicine drug registry diabetes depression insomnia issues  Patient relates intermittent palpitations her heart rhythm is normal today she requests a cardiology consultation

## 2018-04-08 ENCOUNTER — Ambulatory Visit (INDEPENDENT_AMBULATORY_CARE_PROVIDER_SITE_OTHER): Payer: BLUE CROSS/BLUE SHIELD | Admitting: Otolaryngology

## 2018-04-08 DIAGNOSIS — H6123 Impacted cerumen, bilateral: Secondary | ICD-10-CM | POA: Diagnosis not present

## 2018-04-16 ENCOUNTER — Encounter: Payer: Self-pay | Admitting: Family Medicine

## 2018-05-14 ENCOUNTER — Encounter: Payer: Self-pay | Admitting: Cardiology

## 2018-05-14 ENCOUNTER — Ambulatory Visit (INDEPENDENT_AMBULATORY_CARE_PROVIDER_SITE_OTHER): Payer: BLUE CROSS/BLUE SHIELD | Admitting: Cardiology

## 2018-05-14 VITALS — BP 100/60 | HR 73 | Ht <= 58 in | Wt 190.0 lb

## 2018-05-14 DIAGNOSIS — R002 Palpitations: Secondary | ICD-10-CM

## 2018-05-14 NOTE — Patient Instructions (Addendum)
Medication Instructions:   Your physician recommends that you continue on your current medications as directed. Please refer to the Current Medication list given to you today.  Labwork:  NONE  Testing/Procedures: Your physician has recommended that you wear an event monitor for 2 weeks. Event monitors are medical devices that record the heart's electrical activity. Doctors most often [place] these monitors to diagnose arrhythmias. Arrhythmias are problems with the speed or rhythm of the heartbeat. The monitor is a small, portable device. You can wear one while you do your normal daily activities. This is usually used to diagnose what is causing palpitations/syncope (passing out). [employer] will contact you about this monitor.  Follow-Up:  Your physician recommends that you schedule a follow-up appointment in: 6 weeks with Bernerd Pho PA.   Any Other Special Instructions Will Be Listed Below (If Applicable).  If you need a refill on your cardiac medications before your next appointment, please call your pharmacy.

## 2018-05-14 NOTE — Progress Notes (Signed)
Clinical Summary [name] is a 52 y.o.female seen as new consult, referred by Dr [name] for palpitaitons.    1. Palpitations - started 2 months. Can occur anytime. Fluttering midchest, feel lightheaded. Lasts 2-3 minutes, occurs 1-2 times per week - no coffee, mountain dew x 2 liters. No EtOH   2. 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   3. History of CVA   Past Medical History:  Diagnosis Date  . Anxiety   . Arthritis    "hands & feet" (02/11/2013)  . Ataxia   . B12 deficiency anemia    [name] 02/11/2013  . Borderline diabetes   . Chronic bronchitis ([place])    "yearly" (02/11/2013)  . Chronic edema    BLE/notes 02/11/2013  . Chronic low back pain   . Chronic pain syndrome    [name] 02/11/2013  . COPD (chronic obstructive pulmonary disease) ([place])   . Depression   . Eating disorder   . Factor V Leiden mutation, heterozygous ([place]) 01/08/2017  . Fibromyalgia   . GERD (gastroesophageal reflux disease)   . 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 Hypertension   . Impaired fasting glucose   . Migraine    "I use to" (02/11/2013)  . MS (multiple sclerosis) ([place])   . Muscle weakness of lower extremity    bilateral  . Peripheral neuropathy    [name] 02/11/2013  . Peripheral neuropathy   . PONV (postoperative nausea and vomiting)   . UTI (lower urinary tract infection) 02/11/2013   [name] 02/11/2013     Allergies  Allergen Reactions  . Ambien [Zolpidem Tartrate] Other (See Comments)    Crazy dreams  . Ceftin [Cefuroxime Axetil] Nausea And Vomiting    Pt tolerated Cefepime 09/2014 admission  . Hctz [Hydrochlorothiazide] Other (See Comments)    Urinary retention  . Lodine [Etodolac] Hives  . Promethazine Other (See Comments)    Hallucinations.   . Codeine Rash  . Latex Rash  . Penicillins Rash     Current Outpatient Medications  Medication Sig Dispense Refill  . ACCU-CHEK FASTCLIX LANCETS MISC USE TO TEST ONCE DAILY. 100 each PRN  . albuterol (PROVENTIL  HFA;VENTOLIN HFA) 108 (90 Base) MCG/ACT inhaler Inhale 2 puffs into the lungs every 6 (six) hours as needed for wheezing. 1 Inhaler 2  . ELIQUIS 5 MG TABS tablet TAKE (1) TABLET BY MOUTH TWICE DAILY. 60 tablet 5  . gabapentin (NEURONTIN) 300 MG capsule TAKE 2 CAPSULES BY MOUTH IN THE AM, 2 CAPSULES AT LUNCH, AND 2 CAPSULES AT BEDTIME. 180 capsule 6  . hydrocortisone 2.5 % cream Apply topically 2 (two) times daily. 60 g 2  . ONE TOUCH ULTRA TEST test strip USE TO TEST ONCE DAILY. 50 each PRN  . oxyCODONE-acetaminophen (PERCOCET/ROXICET) 5-325 MG tablet Take one tablet every 4 hours prn pain 180 tablet 0  . pantoprazole (PROTONIX) 40 MG tablet TAKE 1 TABLET BY MOUTH ONCE DAILY. 30 tablet 5  . polyethylene glycol powder (GLYCOLAX/MIRALAX) powder One cap in a glass of water 3350 g 0  . potassium chloride (K-DUR) 10 MEQ tablet Take 2 tablets (20 mEq total) by mouth daily. 60 tablet 6  . senna (SENOKOT) 8.6 MG tablet Take 1 tablet (8.6 mg total) by mouth 2 (two) times daily. 60 tablet 5  . sertraline (ZOLOFT) 100 MG tablet 1 and 1/2 daily 150mg  45 tablet 5  . Thiamine HCl (VITAMIN B-1 PO) Take by mouth daily.    . traZODone (DESYREL) 100  MG tablet Take 2 tablets (200 mg total) by mouth at bedtime. 60 tablet 6  . vitamin B-12 (CYANOCOBALAMIN) 1000 MCG tablet Take 1,000 mcg by mouth daily.     No current facility-administered medications for this visit.      Past Surgical History:  Procedure Laterality Date  . ABDOMINAL HYSTERECTOMY  ~ 1987; ~ 2004   "[place]; [place]" (02/11/2013)  . ANTERIOR CERVICAL DECOMP/DISCECTOMY FUSION  2009; 2011  . APPENDECTOMY    . CATARACT EXTRACTION W/PHACO  06/20/2011   Procedure: CATARACT EXTRACTION PHACO AND INTRAOCULAR LENS PLACEMENT (IOC);  Surgeon: [name] T. Gershon Crane;  Location: [facility];  Service: Ophthalmology;  Laterality: Left;  CDE 1.81  . CATARACT EXTRACTION W/PHACO  07/04/2011   Procedure: CATARACT EXTRACTION PHACO AND INTRAOCULAR LENS PLACEMENT (IOC);   Surgeon: [name] T. Gershon Crane;  Location: [facility];  Service: Ophthalmology;  Laterality: Right;  CDE: 1.76  . CESAREAN SECTION  1982; 1984; 1986  . EMBOLECTOMY Left 09/27/2014   Procedure: Left Brachial, Radial,Ulnar Embolectomy with patch angioplasty left brachial, radial and ulnar artery;  Surgeon: [name], MD;  Location: [facility];  Service: Vascular;  Laterality: Left;  . EMBOLECTOMY Left 09/27/2014   Procedure: Left Radial, Brachial, and Ulnar Thrombectomy; Left Brachial to Radial Bypass Graft using Greater Saphenous vein graft from Left Thigh; Left Saphenous Vein Harvest; Intraoperative Arteriogram; Intra-arterial administration of TPA;  Surgeon: [name], MD;  Location: [facility];  Service: Vascular;  Laterality: Left;  . LAPAROTOMY N/A 09/27/2014   Procedure: Exploratory Laparotomy, Biopsy of Perforated Gastric Ulcer, Closure with omental patch;  Surgeon: [name], MD;  Location: [facility];  Service: General;  Laterality: N/A;  . TONSILLECTOMY  1990's?  . TRACHEOSTOMY N/A december 2015  . YAG LASER APPLICATION Right 04/05/3661   Procedure: YAG LASER APPLICATION;  Surgeon: [name] T. Gershon Crane, MD;  Location: [facility];  Service: Ophthalmology;  Laterality: Right;  . YAG LASER APPLICATION Left 9/47/6546   Procedure: YAG LASER APPLICATION;  Surgeon: [name] T. Gershon Crane, MD;  Location: [facility];  Service: Ophthalmology;  Laterality: Left;     Allergies  Allergen Reactions  . Ambien [Zolpidem Tartrate] Other (See Comments)    Crazy dreams  . Ceftin [Cefuroxime Axetil] Nausea And Vomiting    Pt tolerated Cefepime 09/2014 admission  . Hctz [Hydrochlorothiazide] Other (See Comments)    Urinary retention  . Lodine [Etodolac] Hives  . Promethazine Other (See Comments)    Hallucinations.   . Codeine Rash  . Latex Rash  . Penicillins Rash      Family History  Problem Relation Age of Onset  . Hyperlipidemia Mother   . Heart disease Mother   . Cancer Mother        lung  . Diabetes Father   .  Heart disease Father   . Cancer Maternal Grandmother        breast  . Anesthesia problems Neg Hx   . Hypotension Neg Hx   . Malignant hyperthermia Neg Hx   . Pseudochol deficiency Neg Hx      Social History Ms. Adkison reports that she has quit smoking. Her smoking use included cigarettes. She has a 33.00 pack-year smoking history. She quit smokeless tobacco use about 3 years ago. Ms. Schreiner reports that she does not drink alcohol.   Review of Systems CONSTITUTIONAL: No weight loss, fever, chills, weakness or fatigue.  HEENT: Eyes: No visual loss, blurred vision, double vision or yellow sclerae.No hearing loss, sneezing, congestion, runny nose or sore throat.  SKIN: No rash or itching.  CARDIOVASCULAR: per hpi RESPIRATORY: No SOB or cough GASTROINTESTINAL: No anorexia, nausea, vomiting or diarrhea. No abdominal pain or blood.  GENITOURINARY: No burning on urination, no polyuria NEUROLOGICAL: No headache, dizziness, syncope, paralysis, ataxia, numbness or tingling in the extremities. No change in bowel or bladder control.  MUSCULOSKELETAL: No muscle, back pain, joint pain or stiffness.  LYMPHATICS: No enlarged nodes. No history of splenectomy.  PSYCHIATRIC: No history of depression or anxiety.  ENDOCRINOLOGIC: No reports of sweating, cold or heat intolerance. No polyuria or polydipsia.  Marland Kitchen   Physical Examination Vitals:   05/14/18 1350  BP: 100/60  Pulse: 73  SpO2: 99%   Filed Weights   05/14/18 1350  Weight: 190 lb (86.2 kg)    Gen: resting comfortably, no acute distress HEENT: no scleral icterus, pupils equal round and reactive, no palptable cervical adenopathy,  CV: RRR, no m/r/g, no jvd Resp: Clear to auscultation bilaterally GI: abdomen is soft, non-tender, non-distended, normal bowel sounds, no hepatosplenomegaly MSK: extremities are warm, no edema.  Skin: warm, no rash Psych: appropriate affect    Assessment and Plan  1. Palpitations - we will obtain a 2  week event monitor to evalaute for arrhythmia - counseled to wean caffeine - EKG today shows normal sinus rhythm      Arnoldo Lenis, M.D.

## 2018-06-05 ENCOUNTER — Ambulatory Visit (INDEPENDENT_AMBULATORY_CARE_PROVIDER_SITE_OTHER): Payer: BLUE CROSS/BLUE SHIELD

## 2018-06-05 DIAGNOSIS — R002 Palpitations: Secondary | ICD-10-CM

## 2018-06-25 ENCOUNTER — Ambulatory Visit: Payer: Self-pay | Admitting: Student

## 2018-07-02 ENCOUNTER — Other Ambulatory Visit: Payer: Self-pay | Admitting: Family Medicine

## 2018-07-03 NOTE — Telephone Encounter (Signed)
May have each +6 refills

## 2018-07-05 ENCOUNTER — Encounter: Payer: Self-pay | Admitting: Family Medicine

## 2018-07-05 ENCOUNTER — Telehealth: Payer: Self-pay | Admitting: *Deleted

## 2018-07-05 ENCOUNTER — Ambulatory Visit (INDEPENDENT_AMBULATORY_CARE_PROVIDER_SITE_OTHER): Payer: BLUE CROSS/BLUE SHIELD | Admitting: Family Medicine

## 2018-07-05 VITALS — BP 112/72 | Ht <= 58 in

## 2018-07-05 DIAGNOSIS — Z79891 Long term (current) use of opiate analgesic: Secondary | ICD-10-CM | POA: Diagnosis not present

## 2018-07-05 DIAGNOSIS — Z23 Encounter for immunization: Secondary | ICD-10-CM

## 2018-07-05 DIAGNOSIS — G894 Chronic pain syndrome: Secondary | ICD-10-CM

## 2018-07-05 DIAGNOSIS — R7303 Prediabetes: Secondary | ICD-10-CM | POA: Diagnosis not present

## 2018-07-05 DIAGNOSIS — D6851 Activated protein C resistance: Secondary | ICD-10-CM | POA: Diagnosis not present

## 2018-07-05 MED ORDER — OXYCODONE-ACETAMINOPHEN 5-325 MG PO TABS: ORAL_TABLET | ORAL | 0 refills | Status: DC

## 2018-07-05 MED ORDER — GLUCOSE BLOOD VI STRP: ORAL_STRIP | 99 refills | Status: AC

## 2018-07-05 MED ORDER — PREDNISONE 20 MG PO TABS: ORAL_TABLET | ORAL | 0 refills | Status: DC

## 2018-07-05 NOTE — Telephone Encounter (Signed)
Called patient with test results. No answer. Left message to call back.  

## 2018-07-05 NOTE — Telephone Encounter (Signed)
-----   Message from Arnoldo Lenis, MD sent at 07/04/2018 12:31 PM EDT ----- Heart monitor looks fine. There are some times where there is missing data, did she have any troubles wearing the device. How are her symptoms doing?   Zandra Abts MD

## 2018-07-05 NOTE — Progress Notes (Signed)
Subjective:    Patient ID: [name], female    DOB: 17-Feb-1966, 52 y.o.   MRN: [id number]  HPI  This patient was seen today for chronic pain  The medication list was reviewed and updated.   -Compliance with medication: oxycodone  - Number patient states they take daily: 6  -when was the last dose patient took? today  The patient was advised the importance of maintaining medication and not using illegal substances with these.  Here for refills and follow up  The patient was educated that we can provide 3 monthly scripts for their medication, it is their responsibility to follow the instructions.  Side effects or complications from medications: none  Patient is aware that pain medications are meant to minimize the severity of the pain to allow their pain levels to improve to allow for better function. They are aware of that pain medications cannot totally remove their pain.  Due for UDT ( at least once per year) : attempted 07/05/18- patient unable to urinate- attempted oral swab but unable to do due to extremely dry mouth.  Patient relates left shoulder pain discomfort which is been present over the past week      Review of Systems  Constitutional: Negative for activity change and appetite change.  HENT: Negative for congestion and rhinorrhea.   Respiratory: Negative for cough and shortness of breath.   Cardiovascular: Negative for chest pain and leg swelling.  Gastrointestinal: Negative for abdominal pain, nausea and vomiting.  Skin: Negative for color change.  Neurological: Negative for dizziness and weakness.  Psychiatric/Behavioral: Negative for agitation and confusion.       Objective:   Physical Exam  Constitutional: She appears well-nourished. No distress.  HENT:  Head: Normocephalic.  Cardiovascular: Normal rate, regular rhythm and normal heart sounds.  No murmur heard. Pulmonary/Chest: Effort normal and breath sounds normal.  Musculoskeletal: She  exhibits no edema.  Lymphadenopathy:    She has no cervical adenopathy.  Neurological: She is alert.  Psychiatric: Her behavior is normal.  Vitals reviewed.   Patient does have significant soreness in the left shoulder with range of motion      Assessment & Plan:  The patient was seen in followup for chronic pain. A review over at their current pain status was discussed. Drug registry was checked. Prescriptions were given. Discussion was held regarding the importance of compliance with medication as well as pain medication contract.  Time for questions regarding pain management plan occurred. Importance of regular followup visits was discussed. Patient was informed that medication may cause drowsiness and should not be combined  with other medications/alcohol or street drugs. Patient was cautioned that medication could cause drowsiness. If the patient feels medication is causing altered alertness then do not drive or operate dangerous equipment.  Patient unable to give a urine for urine drug test Patient unable to do saliva because of chronic dry mouth We will do serum drug test The patient gets her medicine through her husband and I believe that they are very faithful and how they use her medicine I do not believe she abuses it  Left shoulder pain discomfort she will try prednisone for the next 7 to 14 days if that does not do enough next step would be orthopedic referral-we will not use prednisone long-term because of her history of perforated ulcers and being on Eliquis Avoid anti-inflammatories She is on a PPI  Her moods overall are stable she would like to continue taking trazodone  Denies  any severe abdominal pains or discomfort gets along well with Protonix take it on a regular basis  She is on Eliquis because of factor V mutation no bleeding issues continue this

## 2018-07-09 NOTE — Progress Notes (Signed)
Cardiology Office Note    Date:  07/10/2018   ID:  [name], DOB 10/17/1965, MRN [id number]  PCP:  Kathyrn Drown, MD  Cardiologist: Carlyle Dolly, MD    Chief Complaint  Patient presents with  . Follow-up    6 week visit    History of Present Illness:    [name] is a 52 y.o. female with past medical history of Factor V Leiden (on Eliquis for anticoagulation), and prior CVA's who presents to the office today for 6-week follow-up.  She was examined by Dr. Harl Bowie on 05/14/2018 as a new patient referral for evaluation of palpitations. She reported having symptoms over the past 2 months which would last for 2 to 3-minute intervals. Denied any alcohol use but was consuming a large amount of caffeine. A 14-day event monitor was obtained and showed normal sinus rhythm with no significant arrhythmias. Only 57% of the data was available from the planned study as she had skin irritation with the device.  In talking with the patient and her significant other today, she reports being mostly bedbound during the morning as her caretaker throughout the day is unable to lift her. Upon her significant other returning home, she is able to get in a wheelchair and that is typically when she experiences palpitations. She does report feeling palpitations when the monitor was in place but again this showed no arrhythmias as outlined above. She describes her palpitations her heart "skipping beats" and denies any episodes of her heart racing. She does note occasional dizziness when this occurs but denies any chest discomfort. Symptoms have been occurring for more than a year by her report and can happen 1-2 times per week and last from a few minutes up to 45 minutes. Consumes a 2L [employer] on a daily basis.   No recent dyspnea, orthopnea, PND, or lower extremity edema. She has remained on Eliquis for anticoagulation and denies any evidence of active bleeding.  Past Medical History:  Diagnosis Date    . Anxiety   . Arthritis    "hands & feet" (02/11/2013)  . Ataxia   . B12 deficiency anemia    [name] 02/11/2013  . Borderline diabetes   . Chronic bronchitis ([place])    "yearly" (02/11/2013)  . Chronic edema    BLE/notes 02/11/2013  . Chronic low back pain   . Chronic pain syndrome    [name] 02/11/2013  . COPD (chronic obstructive pulmonary disease) ([place])   . Depression   . Eating disorder   . Factor V Leiden mutation, heterozygous ([place]) 01/08/2017  . Fibromyalgia   . GERD (gastroesophageal reflux disease)   . 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 Hypertension   . Impaired fasting glucose   . Migraine    "I use to" (02/11/2013)  . MS (multiple sclerosis) ([place])   . Muscle weakness of lower extremity    bilateral  . Peripheral neuropathy    [name] 02/11/2013  . Peripheral neuropathy   . PONV (postoperative nausea and vomiting)   . UTI (lower urinary tract infection) 02/11/2013   [name] 02/11/2013    Past Surgical History:  Procedure Laterality Date  . ABDOMINAL HYSTERECTOMY  ~ 1987; ~ 2004   "[place]" (02/11/2013)  . ANTERIOR CERVICAL DECOMP/DISCECTOMY FUSION  2009; 2011  . APPENDECTOMY    . CATARACT EXTRACTION W/PHACO  06/20/2011   Procedure: CATARACT EXTRACTION PHACO AND INTRAOCULAR LENS PLACEMENT (IOC);  Surgeon: [name] T. Gershon Crane;  Location: [facility];  Service: Ophthalmology;  Laterality: Left;  CDE 1.81  . CATARACT EXTRACTION W/PHACO  07/04/2011   Procedure: CATARACT EXTRACTION PHACO AND INTRAOCULAR LENS PLACEMENT (IOC);  Surgeon: [name] T. Gershon Crane;  Location: [facility];  Service: Ophthalmology;  Laterality: Right;  CDE: 1.76  . CESAREAN SECTION  1982; 1984; 1986  . EMBOLECTOMY Left 09/27/2014   Procedure: Left Brachial, Radial,Ulnar Embolectomy with patch angioplasty left brachial, radial and ulnar artery;  Surgeon: [name], MD;  Location: [facility];  Service: Vascular;  Laterality: Left;  . EMBOLECTOMY Left 09/27/2014   Procedure: Left Radial,  Brachial, and Ulnar Thrombectomy; Left Brachial to Radial Bypass Graft using Greater Saphenous vein graft from Left Thigh; Left Saphenous Vein Harvest; Intraoperative Arteriogram; Intra-arterial administration of TPA;  Surgeon: [name], MD;  Location: [facility];  Service: Vascular;  Laterality: Left;  . LAPAROTOMY N/A 09/27/2014   Procedure: Exploratory Laparotomy, Biopsy of Perforated Gastric Ulcer, Closure with omental patch;  Surgeon: [name], MD;  Location: [facility];  Service: General;  Laterality: N/A;  . TONSILLECTOMY  1990's?  . TRACHEOSTOMY N/A december 2015  . YAG LASER APPLICATION Right 1/95/0932   Procedure: YAG LASER APPLICATION;  Surgeon: [name] T. Gershon Crane, MD;  Location: [facility];  Service: Ophthalmology;  Laterality: Right;  . YAG LASER APPLICATION Left 6/71/2458   Procedure: YAG LASER APPLICATION;  Surgeon: [name] T. Gershon Crane, MD;  Location: [facility];  Service: Ophthalmology;  Laterality: Left;    Current Medications: Outpatient Medications Prior to Visit  Medication Sig Dispense Refill  . ACCU-CHEK FASTCLIX LANCETS MISC USE TO TEST ONCE DAILY. 100 each PRN  . albuterol (PROVENTIL HFA;VENTOLIN HFA) 108 (90 Base) MCG/ACT inhaler Inhale 2 puffs into the lungs every 6 (six) hours as needed for wheezing. 1 Inhaler 2  . ELIQUIS 5 MG TABS tablet TAKE (1) TABLET BY MOUTH TWICE DAILY. 60 tablet 5  . gabapentin (NEURONTIN) 300 MG capsule TAKE 2 CAPSULES BY MOUTH IN THE AM, 2 CAPSULES AT LUNCH, AND 2 CAPSULES AT BEDTIME. 180 capsule 6  . glucose blood (ONE TOUCH ULTRA TEST) test strip USE TO TEST ONCE DAILY. 50 each PRN  . hydrocortisone 2.5 % cream Apply topically 2 (two) times daily. 60 g 2  . oxyCODONE-acetaminophen (PERCOCET/ROXICET) 5-325 MG tablet Take one tablet every 4 hours prn pain 180 tablet 0  . oxyCODONE-acetaminophen (PERCOCET/ROXICET) 5-325 MG tablet 1 q4 hours prn pain max 6 per day 180 tablet 0  . oxyCODONE-acetaminophen (PERCOCET/ROXICET) 5-325 MG tablet 1 every 4 hours  as needed pain maximum 6/day 30 tablet 0  . pantoprazole (PROTONIX) 40 MG tablet TAKE 1 TABLET BY MOUTH ONCE DAILY. 30 tablet 5  . polyethylene glycol powder (GLYCOLAX/MIRALAX) powder One cap in a glass of water 3350 g 0  . potassium chloride (K-DUR) 10 MEQ tablet Take 2 tablets (20 mEq total) by mouth daily. 60 tablet 6  . predniSONE (DELTASONE) 20 MG tablet 2 qd for 3d then 1qd for 3d 9 tablet 0  . senna (SENOKOT) 8.6 MG tablet Take 1 tablet (8.6 mg total) by mouth 2 (two) times daily. 60 tablet 5  . sertraline (ZOLOFT) 100 MG tablet 1 and 1/2 daily 150mg  45 tablet 5  . Thiamine HCl (VITAMIN B-1 PO) Take by mouth daily.    . traZODone (DESYREL) 100 MG tablet TAKE 2 TABLETS BY MOUTH AT BEDTIME. 60 tablet 5  . vitamin B-12 (CYANOCOBALAMIN) 1000 MCG tablet Take 1,000 mcg by mouth daily.     No facility-administered medications prior to  visit.      Allergies:   Ambien [zolpidem tartrate]; Ceftin [cefuroxime axetil]; Hctz [hydrochlorothiazide]; Lodine [etodolac]; Promethazine; Codeine; Latex; and Penicillins   Social History   Socioeconomic History  . Marital status: Divorced    Spouse name: Not on file  . Number of children: Not on file  . Years of education: Not on file  . Highest education level: Not on file  Occupational History  . Not on file  Social Needs  . Financial resource strain: Not on file  . Food insecurity:    Worry: Not on file    Inability: Not on file  . Transportation needs:    Medical: Not on file    Non-medical: Not on file  Tobacco Use  . Smoking status: Former Smoker    Packs/day: 1.00    Years: 33.00    Pack years: 33.00    Types: Cigarettes  . Smokeless tobacco: Former Systems developer    Quit date: 09/08/2014  Substance and Sexual Activity  . Alcohol use: No    Alcohol/week: 0.0 standard drinks  . Drug use: No  . Sexual activity: Not Currently    Birth control/protection: Surgical  Lifestyle  . Physical activity:    Days per week: Not on file    Minutes  per session: Not on file  . Stress: Not on file  Relationships  . Social connections:    Talks on phone: Not on file    Gets together: Not on file    Attends religious service: Not on file    Active member of club or organization: Not on file    Attends meetings of clubs or organizations: Not on file    Relationship status: Not on file  Other Topics Concern  . Not on file  Social History Narrative  . Not on file     Family History:  The patient's family history includes Cancer in her maternal grandmother and mother; Diabetes in her father; Heart disease in her father and mother; Hyperlipidemia in her mother.   Review of Systems:   Please see the history of present illness.     General:  No chills, fever, night sweats or weight changes.  Cardiovascular:  No chest pain, dyspnea on exertion, edema, orthopnea,  paroxysmal nocturnal dyspnea. Positive for palpitations.  Dermatological: No rash, lesions/masses Respiratory: No cough, dyspnea Urologic: No hematuria, dysuria Abdominal:   No nausea, vomiting, diarrhea, bright red blood per rectum, melena, or hematemesis Neurologic:  No visual changes, wkns, changes in mental status. All other systems reviewed and are otherwise negative except as noted above.   Physical Exam:    VS:  BP 110/68   Pulse 69   Ht 4\' 9"  (1.448 m)   SpO2 98%   BMI 41.12 kg/m    General: Well developed, well nourished Caucasian female appearing in no acute distress. Sitting in wheelchair.  Head: Normocephalic, atraumatic, sclera non-icteric, no xanthomas, nares are without discharge.  Neck: No carotid bruits. JVD not elevated.  Lungs: Respirations regular and unlabored, without wheezes or rales.  Heart: Regular rate and rhythm. No S3 or S4.  No murmur, no rubs, or gallops appreciated. Abdomen: Soft, non-tender, non-distended with normoactive bowel sounds. No hepatomegaly. No rebound/guarding. No obvious abdominal masses. Msk:  Strength and tone appear  normal for age. No joint deformities or effusions. Extremities: No clubbing or cyanosis. No lower extremity edema.  Distal pedal pulses are 2+ bilaterally. Neuro: Alert and oriented X 3. Moves all extremities spontaneously. No focal deficits  noted. Psych:  Responds to questions appropriately with a normal affect. Skin: No rashes or lesions noted  Wt Readings from Last 3 Encounters:  05/14/18 190 lb (86.2 kg)  10/12/16 165 lb (74.8 kg)  09/17/15 165 lb (74.8 kg)     Studies/Labs Reviewed:   EKG:  EKG is not ordered today.    Recent Labs: 01/04/2018: ALT 13; Hemoglobin 14.5; Platelets 226 07/05/2018: BUN 6; Creatinine, Ser 0.84; Potassium 5.0; Sodium 142   Lipid Panel    Component Value Date/Time   CHOL 239 (H) 01/04/2018 1412   TRIG 287 (H) 01/04/2018 1412   HDL 45 01/04/2018 1412   CHOLHDL 5.3 (H) 01/04/2018 1412   CHOLHDL 4.0 04/02/2014 0525   VLDL 28 04/02/2014 0525   LDLCALC 137 (H) 01/04/2018 1412    Additional studies/ records that were reviewed today include:   Echocardiogram: 09/2014 Study Conclusions  - Left ventricle: The cavity size was normal. Wall thickness was normal. Systolic function was normal. The estimated ejection fraction was in the range of 55% to 60%. Wall motion was normal; there were no regional wall motion abnormalities. Left ventricular diastolic function parameters were normal.  Impressions:  - Normal LV function; no significant valvular abnormality. Compared to 04/02/14, no significant change.  Event Monitor: 06/2018  14 day event monitor. Data available only from 57% of planned study time  Min HR 50, Max HR 109, Avg HR 62  Telemetry tracings show sinus rhythm  No symptoms reported     Assessment:    1. Heart palpitations   2. Factor V Leiden mutation, heterozygous ([place])   3. History of CVA (cerebrovascular accident)      Plan:   In order of problems listed above:  1. Palpitations - She reports having  palpitations for over the past year which can occur 1-2 times per week and last for a few minutes up to 45 minutes. Recent event monitor showed normal sinus rhythm with no significant arrhythmias but she did have difficulty wearing the device due to skin irritation. -Due to her describing the palpitations as "skipped beats" her symptoms sound most consistent with PAC's or PVC's. Rhythm is regular by examination today. - We reviewed the possibility of utilizing PRN Lopressor to assist with her palpitations but she wishes to avoid additional medications at this time. She will call back to let us know if she wishes to pursue this. Also recommended reducing her caffeine intake as she consumes a 2L of regular soda on a daily basis.   2. Factor V Leiden - She remains on Eliquis 5 mg twice daily for anticoagulation. Denies any evidence of active bleeding.  3. History of CVA's - she is mostly wheel-chair and bed bound at baseline. Followed by Neurology. No longer on ASA given the need for anticoagulation.   Medication Adjustments/Labs and Tests Ordered: Current medicines are reviewed at length with the patient today.  Concerns regarding medicines are outlined above.  Medication changes, Labs and Tests ordered today are listed in the Patient Instructions below. Patient Instructions  Medication Instructions:  Your physician recommends that you continue on your current medications as directed. Please refer to the Current Medication list given to you today.  Labwork: NONE   Testing/Procedures: NONE   Follow-Up: Your physician wants you to follow-up in: 1 year. You will receive a reminder letter in the mail two months in advance. If you don't receive a letter, please call our office to schedule the follow-up appointment.  Any Other Special Instructions  Will Be Listed Below (If Applicable).  If you need a refill on your cardiac medications before your next appointment, please call your  pharmacy.  Thank you for choosing [facility]!    Signed, Erma Heritage, PA-C  07/10/2018 5:38 PM    [facility] [address] [place], [place] [address] Phone: [phone]

## 2018-07-10 ENCOUNTER — Ambulatory Visit (INDEPENDENT_AMBULATORY_CARE_PROVIDER_SITE_OTHER): Payer: BLUE CROSS/BLUE SHIELD | Admitting: Student

## 2018-07-10 ENCOUNTER — Encounter: Payer: Self-pay | Admitting: Student

## 2018-07-10 VITALS — BP 110/68 | HR 69 | Ht <= 58 in

## 2018-07-10 DIAGNOSIS — R002 Palpitations: Secondary | ICD-10-CM

## 2018-07-10 DIAGNOSIS — Z8673 Personal history of transient ischemic attack (TIA), and cerebral infarction without residual deficits: Secondary | ICD-10-CM | POA: Diagnosis not present

## 2018-07-10 DIAGNOSIS — D6851 Activated protein C resistance: Secondary | ICD-10-CM

## 2018-07-10 NOTE — Patient Instructions (Signed)
Medication Instructions:  Your physician recommends that you continue on your current medications as directed. Please refer to the Current Medication list given to you today.   Labwork: NONE   Testing/Procedures: NONE   Follow-Up: Your physician wants you to follow-up in: 1 year. You will receive a reminder letter in the mail two months in advance. If you don't receive a letter, please call our office to schedule the follow-up appointment.   Any Other Special Instructions Will Be Listed Below (If Applicable).     If you need a refill on your cardiac medications before your next appointment, please call your pharmacy.  Thank you for choosing [facility]!   

## 2018-07-17 ENCOUNTER — Encounter: Payer: Self-pay | Admitting: Family Medicine

## 2018-07-17 LAB — DRUG SCREEN 10 W/CONF, SERUM
AMPHETAMINES, IA: NEGATIVE ng/mL
BARBITURATES, IA: NEGATIVE ug/mL
Benzodiazepines, IA: NEGATIVE ng/mL
COCAINE & METABOLITE, IA: NEGATIVE ng/mL
METHADONE, IA: NEGATIVE ng/mL
Opiates, IA: NEGATIVE ng/mL
Oxycodones, IA: POSITIVE ng/mL — AB
PHENCYCLIDINE, IA: NEGATIVE ng/mL
PROPOXYPHENE, IA: NEGATIVE ng/mL
THC(Marijuana) Metabolite, IA: NEGATIVE ng/mL

## 2018-07-17 LAB — BASIC METABOLIC PANEL
BUN/Creatinine Ratio: 7 — ABNORMAL LOW (ref 9–23)
BUN: 6 mg/dL (ref 6–24)
CALCIUM: 9.3 mg/dL (ref 8.7–10.2)
CHLORIDE: 100 mmol/L (ref 96–106)
CO2: 28 mmol/L (ref 20–29)
Creatinine, Ser: 0.84 mg/dL (ref 0.57–1.00)
GFR calc Af Amer: 92 mL/min/{1.73_m2} (ref >59)
GFR calc non Af Amer: 80 mL/min/{1.73_m2} (ref >59)
Glucose: 83 mg/dL (ref 65–99)
Potassium: 5 mmol/L (ref 3.5–5.2)
Sodium: 142 mmol/L (ref 134–144)

## 2018-07-17 LAB — OXYCODONES,MS,WB/SP RFX
OXYCODONES CONFIRMATION: POSITIVE
Oxycocone: 40.8 ng/mL
Oxymorphone: NEGATIVE ng/mL

## 2018-07-23 ENCOUNTER — Telehealth: Payer: Self-pay | Admitting: Family Medicine

## 2018-07-23 NOTE — Telephone Encounter (Signed)
Contacted [facility] and spoke with Doren Custard the pharmacist. Doren Custard verbalized understanding.

## 2018-07-23 NOTE — Telephone Encounter (Signed)
Pt's son calling in regards of Pt [name] refill for:    oxyCODONE-acetaminophen (PERCOCET/ROXICET) 5-325 MG tablet   For the month of October pt's refill was dispensed for 30 tab, pt's son inform me pt usually gets 180 tab. Please advise.  [place], [place]

## 2018-07-23 NOTE — Telephone Encounter (Signed)
This was a electronic typographical error The patient was supposed to get 180 We will need to do a prescription for 150 tablets and send it to [facility] so that when the 30 runs out she can get her 150 Please notify [facility] about this issue so they understand why she would be getting a new prescription for 150 Then please send me this message back so I can send in a prescription for 150 tablets For her next 2 prescriptions it does have 180 tablets per prescription

## 2018-07-23 NOTE — Telephone Encounter (Signed)
Called [facility] and they sent out on delivery to her yesterday #30. They have two more scripts on file for quantity of 180

## 2018-07-25 ENCOUNTER — Other Ambulatory Visit: Payer: Self-pay | Admitting: Family Medicine

## 2018-07-25 MED ORDER — OXYCODONE-ACETAMINOPHEN 5-325 MG PO TABS: ORAL_TABLET | ORAL | 0 refills | Status: DC

## 2018-07-25 NOTE — Telephone Encounter (Signed)
150 tablets was sent in that they could go ahead and fill to supplement therefore it would make it 180 tablets the future prescriptions will be a 180 tablets

## 2018-08-02 ENCOUNTER — Other Ambulatory Visit: Payer: Self-pay | Admitting: Family Medicine

## 2018-08-05 NOTE — Telephone Encounter (Signed)
[name] calling checking on status of refill request. He states pt has been out for 2 days

## 2018-08-26 ENCOUNTER — Other Ambulatory Visit: Payer: Self-pay | Admitting: Family Medicine

## 2018-08-30 ENCOUNTER — Other Ambulatory Visit: Payer: Self-pay | Admitting: Family Medicine

## 2018-09-24 ENCOUNTER — Other Ambulatory Visit: Payer: Self-pay | Admitting: Family Medicine

## 2018-10-03 ENCOUNTER — Other Ambulatory Visit: Payer: Self-pay | Admitting: Family Medicine

## 2018-10-04 ENCOUNTER — Ambulatory Visit (INDEPENDENT_AMBULATORY_CARE_PROVIDER_SITE_OTHER): Payer: BLUE CROSS/BLUE SHIELD | Admitting: Family Medicine

## 2018-10-04 ENCOUNTER — Encounter: Payer: Self-pay | Admitting: Family Medicine

## 2018-10-04 VITALS — BP 132/78 | Ht <= 58 in

## 2018-10-04 DIAGNOSIS — R7303 Prediabetes: Secondary | ICD-10-CM

## 2018-10-04 DIAGNOSIS — D510 Vitamin B12 deficiency anemia due to intrinsic factor deficiency: Secondary | ICD-10-CM | POA: Diagnosis not present

## 2018-10-04 DIAGNOSIS — D6859 Other primary thrombophilia: Secondary | ICD-10-CM | POA: Diagnosis not present

## 2018-10-04 DIAGNOSIS — D6851 Activated protein C resistance: Secondary | ICD-10-CM

## 2018-10-04 DIAGNOSIS — G894 Chronic pain syndrome: Secondary | ICD-10-CM | POA: Diagnosis not present

## 2018-10-04 DIAGNOSIS — E785 Hyperlipidemia, unspecified: Secondary | ICD-10-CM | POA: Diagnosis not present

## 2018-10-04 MED ORDER — ALBUTEROL SULFATE HFA 108 (90 BASE) MCG/ACT IN AERS: 2.0000 | INHALATION_SPRAY | Freq: Four times a day (QID) | RESPIRATORY_TRACT | 2 refills | Status: DC | PRN

## 2018-10-04 MED ORDER — OXYCODONE-ACETAMINOPHEN 5-325 MG PO TABS: ORAL_TABLET | ORAL | 0 refills | Status: DC

## 2018-10-04 MED ORDER — POTASSIUM CHLORIDE ER 10 MEQ PO TBCR: 20.0000 meq | EXTENDED_RELEASE_TABLET | Freq: Every day | ORAL | 6 refills | Status: DC

## 2018-10-04 MED ORDER — PANTOPRAZOLE SODIUM 40 MG PO TBEC: 40.0000 mg | DELAYED_RELEASE_TABLET | Freq: Every day | ORAL | 12 refills | Status: DC

## 2018-10-04 MED ORDER — TRAZODONE HCL 100 MG PO TABS: 200.0000 mg | ORAL_TABLET | Freq: Every day | ORAL | 5 refills | Status: DC

## 2018-10-04 NOTE — Progress Notes (Signed)
Subjective:    Patient ID: [name], female    DOB: [birth year], 52 y.o.   MRN: [id number]  HPI This patient was seen today for chronic pain  The medication list was reviewed and updated.   -Compliance with medication: yes; one every 4 hours  - Number patient states they take daily: 6  -when was the last dose patient took? 12 noon today  The patient was advised the importance of maintaining medication and not using illegal substances with these.  Here for refills and follow up  The patient was educated that we can provide 3 monthly scripts for their medication, it is their responsibility to follow the instructions.  Side effects or complications from medications: none  Patient is aware that pain medications are meant to minimize the severity of the pain to allow their pain levels to improve to allow for better function. They are aware of that pain medications cannot totally remove their pain.  Due for UDT ( at least once per year) : 07/05/18 through [facility]   [place] registry was checkedg  Patient does have prediabetes has been sometime since she has had a A1c she is able to watch her diet fairly well we will check an A1c  Has history of hyperlipidemia is at risk for cardiovascular issues recommend checking lipid profile  Patient does have anemia related issues I recommend checking a CBC await the results of this  Review of Systems  Constitutional: Negative for activity change, appetite change and fatigue.  HENT: Negative for congestion and rhinorrhea.   Respiratory: Negative for cough and shortness of breath.   Cardiovascular: Negative for chest pain and leg swelling.  Gastrointestinal: Negative for abdominal pain and diarrhea.  Endocrine: Negative for polydipsia and polyphagia.  Skin: Negative for color change.  Neurological: Negative for dizziness and weakness.  Psychiatric/Behavioral: Negative for behavioral problems and confusion.       Objective:   Physical  Exam Vitals signs reviewed.  Constitutional:      General: She is not in acute distress. HENT:     Head: Normocephalic and atraumatic.  Eyes:     General:        Right eye: No discharge.        Left eye: No discharge.  Neck:     Trachea: No tracheal deviation.  Cardiovascular:     Rate and Rhythm: Normal rate and regular rhythm.     Heart sounds: Normal heart sounds. No murmur.  Pulmonary:     Effort: Pulmonary effort is normal. No respiratory distress.     Breath sounds: Normal breath sounds.  Lymphadenopathy:     Cervical: No cervical adenopathy.  Skin:    General: Skin is warm and dry.  Neurological:     Mental Status: She is alert.     Coordination: Coordination normal.  Psychiatric:        Behavior: Behavior normal.    The patient and the husband states the pain medicine does help her tolerate her joint pains and back pains and allow her to function better he denies abusing it they do not exceed the given amount  25 minutes was spent with the patient.  This statement verifies that 25 minutes was indeed spent with the patient.  More than 50% of this visit-total duration of the visit-was spent in counseling and coordination of care. The issues that the patient came in for today as reflected in the diagnosis (s) please refer to documentation for further details.  Assessment & Plan:  Patient has hypercoagulability No sign of any type of stroke or embolus Continue Eliquis no sign of bleeding but will check CBC  Prediabetes do better job of minimizing starches in diet check A1c  Hyperlipidemia watch diet check lab work await results previously results reviewed  Blood pressure reasonable.  Patient on multiple medicines check kidney function  Patient on multiple medicines check liver function  The patient was seen in followup for chronic pain. A review over at their current pain status was discussed. Drug registry was checked. Prescriptions were  given. Discussion was held regarding the importance of compliance with medication as well as pain medication contract.  Time for questions regarding pain management plan occurred. Importance of regular followup visits was discussed. Patient was informed that medication may cause drowsiness and should not be combined  with other medications/alcohol or street drugs. Patient was cautioned that medication could cause drowsiness. If the patient feels medication is causing altered alertness then do not drive or operate dangerous equipment.  Drug registry was checked 3 prescriptions were electronically sent him  Follow-up in approximately 3 months

## 2018-10-05 LAB — CBC WITH DIFFERENTIAL/PLATELET
Basophils Absolute: 0 10*3/uL (ref 0.0–0.2)
Basos: 1 %
EOS (ABSOLUTE): 0.1 10*3/uL (ref 0.0–0.4)
EOS: 1 %
HEMATOCRIT: 42 % (ref 34.0–46.6)
Hemoglobin: 14.7 g/dL (ref 11.1–15.9)
IMMATURE GRANS (ABS): 0 10*3/uL (ref 0.0–0.1)
Immature Granulocytes: 0 %
Lymphocytes Absolute: 1.9 10*3/uL (ref 0.7–3.1)
Lymphs: 48 %
MCH: 35.2 pg — AB (ref 26.6–33.0)
MCHC: 35 g/dL (ref 31.5–35.7)
MCV: 101 fL — ABNORMAL HIGH (ref 79–97)
Monocytes Absolute: 0.3 10*3/uL (ref 0.1–0.9)
Monocytes: 7 %
Neutrophils Absolute: 1.7 10*3/uL (ref 1.4–7.0)
Neutrophils: 43 %
Platelets: 240 10*3/uL (ref 150–450)
RBC: 4.18 x10E6/uL (ref 3.77–5.28)
RDW: 12.5 % (ref 12.3–15.4)
WBC: 4 10*3/uL (ref 3.4–10.8)

## 2018-10-05 LAB — BASIC METABOLIC PANEL
BUN/Creatinine Ratio: 9 (ref 9–23)
BUN: 8 mg/dL (ref 6–24)
CALCIUM: 9.3 mg/dL (ref 8.7–10.2)
CO2: 28 mmol/L (ref 20–29)
Chloride: 100 mmol/L (ref 96–106)
Creatinine, Ser: 0.87 mg/dL (ref 0.57–1.00)
GFR calc Af Amer: 88 mL/min/{1.73_m2} (ref >59)
GFR calc non Af Amer: 76 mL/min/{1.73_m2} (ref >59)
Glucose: 96 mg/dL (ref 65–99)
Potassium: 4.6 mmol/L (ref 3.5–5.2)
Sodium: 141 mmol/L (ref 134–144)

## 2018-10-05 LAB — HEMOGLOBIN A1C
Est. average glucose Bld gHb Est-mCnc: 114 mg/dL
Hgb A1c MFr Bld: 5.6 % (ref 4.8–5.6)

## 2018-10-05 LAB — HEPATIC FUNCTION PANEL
ALT: 12 IU/L (ref 0–32)
AST: 21 IU/L (ref 0–40)
Albumin: 3.9 g/dL (ref 3.5–5.5)
Alkaline Phosphatase: 90 IU/L (ref 39–117)
Bilirubin Total: 0.4 mg/dL (ref 0.0–1.2)
Bilirubin, Direct: 0.11 mg/dL (ref 0.00–0.40)
TOTAL PROTEIN: 6.4 g/dL (ref 6.0–8.5)

## 2018-10-05 LAB — LIPID PANEL
CHOLESTEROL TOTAL: 235 mg/dL — AB (ref 100–199)
Chol/HDL Ratio: 4.8 ratio — ABNORMAL HIGH (ref 0.0–4.4)
HDL: 49 mg/dL (ref >39)
LDL Calculated: 142 mg/dL — ABNORMAL HIGH (ref 0–99)
Triglycerides: 221 mg/dL — ABNORMAL HIGH (ref 0–149)
VLDL Cholesterol Cal: 44 mg/dL — ABNORMAL HIGH (ref 5–40)

## 2018-10-06 ENCOUNTER — Encounter: Payer: Self-pay | Admitting: Family Medicine

## 2018-10-17 ENCOUNTER — Ambulatory Visit (INDEPENDENT_AMBULATORY_CARE_PROVIDER_SITE_OTHER): Payer: Self-pay | Admitting: Otolaryngology

## 2018-10-28 ENCOUNTER — Ambulatory Visit (INDEPENDENT_AMBULATORY_CARE_PROVIDER_SITE_OTHER): Payer: BLUE CROSS/BLUE SHIELD | Admitting: Family Medicine

## 2018-10-28 VITALS — BP 128/82

## 2018-10-28 DIAGNOSIS — M79675 Pain in left toe(s): Secondary | ICD-10-CM | POA: Diagnosis not present

## 2018-10-28 MED ORDER — CEPHALEXIN 500 MG PO CAPS: 500.0000 mg | ORAL_CAPSULE | Freq: Four times a day (QID) | ORAL | 0 refills | Status: AC

## 2018-10-28 NOTE — Progress Notes (Signed)
   Subjective:    Patient ID: [name], female    DOB: [birth year], 53 y.o.   MRN: [id number]  HPI  Patient arrives with left great toe pain. Patient states it started a month ago but is getting worse. No drainage, no fever. Pain is constant. No injury.   Review of Systems  Constitutional: Negative for fever.       Objective:   Physical Exam Vitals signs and nursing note reviewed.  Constitutional:      General: She is not in acute distress.    Appearance: She is well-developed.  HENT:     Head: Normocephalic and atraumatic.  Cardiovascular:     Rate and Rhythm: Normal rate and regular rhythm.     Heart sounds: Normal heart sounds. No murmur.  Pulmonary:     Effort: Pulmonary effort is normal. No respiratory distress.     Breath sounds: Normal breath sounds.  Skin:    General: Skin is warm and dry.     Comments: Left great toe with what appears to be possible ingrown toenail, surrounding skin with erythema and tenderness. No purulent drainage noted.   Neurological:     Mental Status: She is alert and oriented to person, place, and time.           Assessment & Plan:  Great toe pain, left - Plan: Ambulatory referral to Podiatry  Will refer to podiatry for further evaluation and treatment. Will treat for possible underlying skin infection with keflex qid x 7 days. Pt has tolerated IV cephalosporins in the past without problem. Warning signs discussed. F/u if symptoms worsen or fail to improve.   Dr. [name] was consulted on this case and is in agreement with the above treatment plan.

## 2018-11-06 ENCOUNTER — Encounter: Payer: Self-pay | Admitting: Family Medicine

## 2018-11-20 ENCOUNTER — Ambulatory Visit (INDEPENDENT_AMBULATORY_CARE_PROVIDER_SITE_OTHER): Payer: BLUE CROSS/BLUE SHIELD | Admitting: Podiatry

## 2018-11-20 ENCOUNTER — Encounter: Payer: Self-pay | Admitting: Podiatry

## 2018-11-20 VITALS — BP 97/57 | HR 66

## 2018-11-20 DIAGNOSIS — M79676 Pain in unspecified toe(s): Secondary | ICD-10-CM

## 2018-11-20 DIAGNOSIS — L603 Nail dystrophy: Secondary | ICD-10-CM

## 2018-11-20 NOTE — Patient Instructions (Signed)

## 2018-11-25 NOTE — Progress Notes (Signed)
   Subjective: 53 year old female presenting today as a new patient with a chief complaint of painful left great toenail that became symptomatic 1-2 months ago. She states she is unable to trim the nail herself. She states it is digging into the surrounding skin. She denies modifying factors. Patient is here for further evaluation and treatment.   Past Medical History:  Diagnosis Date  . Anxiety   . Arthritis    "hands & feet" (02/11/2013)  . Ataxia   . B12 deficiency anemia    [name] 02/11/2013  . Borderline diabetes   . Chronic bronchitis ([place])    "yearly" (02/11/2013)  . Chronic edema    BLE/notes 02/11/2013  . Chronic low back pain   . Chronic pain syndrome    [name] 02/11/2013  . COPD (chronic obstructive pulmonary disease) ([place])   . Depression   . Eating disorder   . Factor V Leiden mutation, heterozygous ([place]) 01/08/2017  . Fibromyalgia   . GERD (gastroesophageal reflux disease)   . 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 Hypertension   . Impaired fasting glucose   . Migraine    "I use to" (02/11/2013)  . MS (multiple sclerosis) ([place])   . Muscle weakness of lower extremity    bilateral  . Peripheral neuropathy    [name] 02/11/2013  . Peripheral neuropathy   . PONV (postoperative nausea and vomiting)   . UTI (lower urinary tract infection) 02/11/2013   [name] 02/11/2013    Objective:  General: Well developed, nourished, in no acute distress, alert and oriented x3   Dermatology: Hyperkeratotic, discolored, thickened, onychodystrophy of the left great toenail. Skin is warm, dry and supple bilateral lower extremities. Negative for open lesions or macerations.  Vascular: Dorsalis Pedis artery and Posterior Tibial artery pedal pulses palpable. No lower extremity edema noted.   Neruologic: Grossly intact via light touch bilateral.  Musculoskeletal: Muscular strength within normal limits in all groups bilateral. Normal range of motion noted to all pedal  and ankle joints.   Assessment:  #1 Dystrophic nail of the left hallux  Plan of Care:  1. Patient evaluated.  2. Discussed treatment alternatives and plan of care. Explained nail avulsion procedure and post procedure course to patient. 3. Patient opted for total temporary nail avulsion of the left great toenail.  4. Prior to procedure, local anesthesia infiltration utilized using 3 ml of a 50:50 mixture of 2% plain lidocaine and 0.5% plain marcaine in a normal hallux block fashion and a betadine prep performed.  5. Light dressing applied. 6. Return to clinic as needed.   Edrick Kins, DPM [facility]  Dr. Edrick Kins, [address]                                        [place], [place] [address]                Office [phone]  Fax [phone]

## 2018-12-30 ENCOUNTER — Telehealth: Payer: Self-pay | Admitting: Family Medicine

## 2018-12-30 ENCOUNTER — Other Ambulatory Visit: Payer: Self-pay | Admitting: Family Medicine

## 2018-12-30 MED ORDER — OXYCODONE-ACETAMINOPHEN 5-325 MG PO TABS: ORAL_TABLET | ORAL | 0 refills | Status: DC

## 2018-12-30 NOTE — Telephone Encounter (Signed)
I will go ahead and send them one prescription regarding this For follow-up may need to do either phone visit or video visit

## 2018-12-30 NOTE — Telephone Encounter (Signed)
Requesting refill for  oxyCODONE-acetaminophen (PERCOCET/ROXICET) 5-325 MG tablet   Pharmacy:  [place], [address] on [address] [place]

## 2018-12-30 NOTE — Telephone Encounter (Signed)
Patient is aware of all. 

## 2018-12-30 NOTE — Telephone Encounter (Signed)
Please advise 

## 2018-12-31 ENCOUNTER — Other Ambulatory Visit: Payer: Self-pay | Admitting: Family Medicine

## 2019-01-02 ENCOUNTER — Ambulatory Visit: Payer: Medicare Other | Admitting: Family Medicine

## 2019-01-03 ENCOUNTER — Ambulatory Visit: Payer: Medicare Other | Admitting: Family Medicine

## 2019-01-13 ENCOUNTER — Telehealth: Payer: Self-pay | Admitting: Family Medicine

## 2019-01-13 MED ORDER — ONDANSETRON HCL 8 MG PO TABS: ORAL_TABLET | ORAL | 1 refills | Status: DC

## 2019-01-13 NOTE — Telephone Encounter (Signed)
Please advise. Thank you

## 2019-01-13 NOTE — Telephone Encounter (Signed)
Medication sent in and patient is aware.  

## 2019-01-13 NOTE — Telephone Encounter (Signed)
Would like something called in for occasional nausea.  No other symptms.  [facility]

## 2019-01-13 NOTE — Telephone Encounter (Signed)
Zofran 8 mg tablet 1 3 times daily as needed, #15, 1 refill

## 2019-01-31 ENCOUNTER — Ambulatory Visit (INDEPENDENT_AMBULATORY_CARE_PROVIDER_SITE_OTHER): Payer: BLUE CROSS/BLUE SHIELD | Admitting: Family Medicine

## 2019-01-31 ENCOUNTER — Other Ambulatory Visit: Payer: Self-pay

## 2019-01-31 DIAGNOSIS — K2900 Acute gastritis without bleeding: Secondary | ICD-10-CM | POA: Diagnosis not present

## 2019-01-31 MED ORDER — OXYCODONE-ACETAMINOPHEN 5-325 MG PO TABS: ORAL_TABLET | ORAL | 0 refills | Status: DC

## 2019-01-31 MED ORDER — SUCRALFATE 1 G PO TABS: ORAL_TABLET | ORAL | 3 refills | Status: DC

## 2019-01-31 NOTE — Progress Notes (Signed)
   Subjective:    Patient ID: [name], female    DOB: Feb 27, 1966, 53 y.o.   MRN: [id number] Telephone and video Patient at home we were present at the office coronavirus outbreak Abdominal Pain  This is a new problem. Associated symptoms include nausea.  pt is waking up every morning with nausea, weakness, poor appetite. Pt is able to keep liquids down. Pt husband states that this has been going on for a couple weeks. Pt husband states that we had called in Zofran but that has not helped.  Patient has nausea in the morning intermittent epigastric pain not vomiting blood no black vomitus bowel movements no blood noted in the bowel movements.  No fever chills no severe discomfort.  Some intermittent nausea with epigastric discomfort first thing in the morning no other particular troubles Virtual Visit via Video Note  I connected with [name] on 01/31/19 at 11:30 AM EDT by a video enabled telemedicine application and verified that I am speaking with the correct person using two identifiers.   I discussed the limitations of evaluation and management by telemedicine and the availability of in person appointments. The patient expressed understanding and agreed to proceed.  History of Present Illness:    Observations/Objective:   Assessment and Plan:   Follow Up Instructions:    I discussed the assessment and treatment plan with the patient. The patient was provided an opportunity to ask questions and all were answered. The patient agreed with the plan and demonstrated an understanding of the instructions.   The patient was advised to call back or seek an in-person evaluation if the symptoms worsen or if the condition fails to improve as anticipated.  I provided 15 minutes of non-face-to-face time during this encounter.   Vicente Males, LPN     Review of Systems  Gastrointestinal: Positive for abdominal pain and nausea.       Objective:   Physical Exam         Assessment & Plan:  Probable gastritis Continue PPI Add Carafate 1 g 3 times daily May crush and put into liquid Follow-up office visit 2 to 3 weeks for pain medicine and recheck of abdominal symptoms If not doing better may need GI referral  Will send in 1 refill on pain medicine and do additional refills on follow-up

## 2019-02-14 ENCOUNTER — Other Ambulatory Visit: Payer: Self-pay

## 2019-02-14 ENCOUNTER — Ambulatory Visit (INDEPENDENT_AMBULATORY_CARE_PROVIDER_SITE_OTHER): Payer: BLUE CROSS/BLUE SHIELD | Admitting: Family Medicine

## 2019-02-14 DIAGNOSIS — G609 Hereditary and idiopathic neuropathy, unspecified: Secondary | ICD-10-CM

## 2019-02-14 DIAGNOSIS — G894 Chronic pain syndrome: Secondary | ICD-10-CM | POA: Diagnosis not present

## 2019-02-14 DIAGNOSIS — D6851 Activated protein C resistance: Secondary | ICD-10-CM

## 2019-02-14 DIAGNOSIS — D6859 Other primary thrombophilia: Secondary | ICD-10-CM

## 2019-02-14 DIAGNOSIS — R29898 Other symptoms and signs involving the musculoskeletal system: Secondary | ICD-10-CM | POA: Diagnosis not present

## 2019-02-14 MED ORDER — OXYCODONE-ACETAMINOPHEN 7.5-325 MG PO TABS: ORAL_TABLET | ORAL | 0 refills | Status: DC

## 2019-02-14 NOTE — Progress Notes (Signed)
   Subjective:    Patient ID: [name], female    DOB: 09/29/1966, 53 y.o.   MRN: [id number]  HPI Follow up on abdominal pain. Not having any more nausea or vomiting. No more abdominal pain.  Overall doing much better no burning in the abdomen no vomiting no diarrhea no bloody stools.  Having pain in both feet for years. Has neuropathy. Takes oxycodone 5mg  one 6 times a day and its not relieving the pain.  Unfortunately the 5 mg 6 times a day is not relieving her pain we did discuss with her husband and the patient in detail other options apparently having a lot of burning and pain and discomfort in the feet also having some increased weakness into the muscles of the legs with difficulty supporting her weight and walking she is disabled and has underlying degenerative neurologic condition  Virtual Visit via Video Note  I connected with [name] on 02/14/19 at  3:00 PM EDT by a video enabled telemedicine application and verified that I am speaking with the correct person using two identifiers.  Location: Patient: home Provider: office   I discussed the limitations of evaluation and management by telemedicine and the availability of in person appointments. The patient expressed understanding and agreed to proceed.  History of Present Illness:    Observations/Objective:   Assessment and Plan:   Follow Up Instructions:    I discussed the assessment and treatment plan with the patient. The patient was provided an opportunity to ask questions and all were answered. The patient agreed with the plan and demonstrated an understanding of the instructions.   The patient was advised to call back or seek an in-person evaluation if the symptoms worsen or if the condition fails to improve as anticipated.  I provided 16 minutes of non-face-to-face time during this encounter.     Review of Systems     Objective:   Physical Exam        Assessment & Plan:  Chronic pain syndrome  related to neuropathy in the feet We will increase the strength of the medicine to 7.5 mg recommend 5/day the overall amount would be greater than the current amount of her 5 mg tablet for a 24-hour.  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

## 2019-02-17 NOTE — Addendum Note (Signed)
Addended by: Dairl Ponder on: 02/17/2019 11:17 AM   Modules accepted: Orders

## 2019-02-17 NOTE — Progress Notes (Signed)
Referral ordered in EPIC. 

## 2019-02-19 ENCOUNTER — Telehealth: Payer: Self-pay | Admitting: Family Medicine

## 2019-02-19 ENCOUNTER — Other Ambulatory Visit: Payer: Self-pay | Admitting: Family Medicine

## 2019-02-19 MED ORDER — OXYCODONE-ACETAMINOPHEN 5-325 MG PO TABS: ORAL_TABLET | ORAL | 0 refills | Status: DC

## 2019-02-19 NOTE — Telephone Encounter (Signed)
Patient would like you to lower her dosage on oxycodone,she want to go back to 5 mg instead 7.5 mg. Please advise

## 2019-02-19 NOTE — Telephone Encounter (Signed)
Please advise. Thank u

## 2019-02-19 NOTE — Telephone Encounter (Signed)
I did send in 3 prescriptions for her oxycodone 5 mg/325 1 every 4 hours as needed severe pain #180 These were sent to [facility] They may be filled starting tomorrow on the 14th Please notify family

## 2019-02-20 NOTE — Telephone Encounter (Signed)
Family notified

## 2019-03-17 ENCOUNTER — Other Ambulatory Visit: Payer: Self-pay | Admitting: Family Medicine

## 2019-03-18 NOTE — Telephone Encounter (Signed)
Ok plus three ref 

## 2019-04-01 ENCOUNTER — Other Ambulatory Visit: Payer: Self-pay | Admitting: Family Medicine

## 2019-04-02 NOTE — Telephone Encounter (Signed)
May refill each x6 thank you

## 2019-04-03 ENCOUNTER — Telehealth: Payer: Self-pay | Admitting: Family Medicine

## 2019-04-03 MED ORDER — HYDROXYZINE PAMOATE 25 MG PO CAPS: 25.0000 mg | ORAL_CAPSULE | Freq: Every day | ORAL | 1 refills | Status: DC

## 2019-04-03 NOTE — Telephone Encounter (Signed)
Pt is needing something other than trazodone called in to help her sleep. She states it is not helping her anymore and is unsure if its due to her taking it for so long.

## 2019-04-03 NOTE — Telephone Encounter (Signed)
Prescription sent electronically to pharmacy. Discussed with Mark(DPR). They will try the vistaril and stop the trazodone and call back and schedule a virtual visit with Dr Nicki Reaper if ongoing problems.

## 2019-04-03 NOTE — Telephone Encounter (Signed)
1.  The average person typically does not sleep well it is possible we may have to do a virtual visit to discuss this further  In the meantime stop trazodone #2 there is no perfect sleep medication #3 the patient is on pain medication therefore we cannot use any type of prescription sleep pill #4 May try Vistaril 25 mg take 1  each evening as needed to help with sleep #30, 1 refill

## 2019-05-01 ENCOUNTER — Other Ambulatory Visit: Payer: Self-pay | Admitting: Family Medicine

## 2019-05-02 NOTE — Telephone Encounter (Signed)
Medication not currently on med list. Left message to return call

## 2019-05-02 NOTE — Telephone Encounter (Signed)
Pt boyfriend [name] returned call. (is on DPR) pt was prescribed Vistaril 25 mg last month on 04/03/2019. Pt boyfriend states that Vistaril is not working and pt would like to go back to Trazodone. Please advise. Thank you

## 2019-05-03 NOTE — Telephone Encounter (Signed)
I recommend going back to 100 mg of trazodone nightly, #30, 5 refills, discontinue Vistaril

## 2019-05-13 ENCOUNTER — Telehealth: Payer: Self-pay | Admitting: Family Medicine

## 2019-05-13 NOTE — Telephone Encounter (Signed)
[name] states vistaril did not work and trazodone was sent in to pharm on 7/27 but it was wrong dose. She was taking 100mg  2 qhs but 100mg  one qhs was sent in.

## 2019-05-13 NOTE — Telephone Encounter (Signed)
Trazodone, husband said patient is taking two not one and needs the rx changed.  [place]

## 2019-05-13 NOTE — Telephone Encounter (Signed)
Left message to return call to clarify. Trazodone marked out on med list and note in epic states to stop trazodone and start visteril.

## 2019-05-14 MED ORDER — TRAZODONE HCL 100 MG PO TABS: ORAL_TABLET | ORAL | 5 refills | Status: DC

## 2019-05-14 NOTE — Telephone Encounter (Signed)
Because of her other medicines I would recommend a slightly lower dose of trazodone I would recommend 100 mg trazodone she will take 1-1/2 each evening 150 mg total, #45, 5 refills

## 2019-05-14 NOTE — Telephone Encounter (Signed)
Mark(DPR) advised per Dr Nicki Reaper: Because of her other medicines he would recommend a slightly lower dose of trazodone and would recommend 100 mg trazodone she will take 1-1/2 each evening 150 mg total, #45, 5 refills Mark verbalized understanding. Prescription sent electronically to pharmacy.

## 2019-05-28 ENCOUNTER — Other Ambulatory Visit: Payer: Self-pay | Admitting: *Deleted

## 2019-05-28 MED ORDER — TRAZODONE HCL 100 MG PO TABS: ORAL_TABLET | ORAL | 5 refills | Status: DC

## 2019-05-30 ENCOUNTER — Other Ambulatory Visit: Payer: Self-pay | Admitting: Family Medicine

## 2019-06-03 NOTE — Telephone Encounter (Signed)
May have this +4 refills on each

## 2019-06-12 ENCOUNTER — Other Ambulatory Visit: Payer: Self-pay

## 2019-06-13 ENCOUNTER — Ambulatory Visit (INDEPENDENT_AMBULATORY_CARE_PROVIDER_SITE_OTHER): Payer: BC Managed Care – PPO | Admitting: Family Medicine

## 2019-06-13 DIAGNOSIS — D6851 Activated protein C resistance: Secondary | ICD-10-CM | POA: Diagnosis not present

## 2019-06-13 DIAGNOSIS — D6859 Other primary thrombophilia: Secondary | ICD-10-CM | POA: Diagnosis not present

## 2019-06-13 DIAGNOSIS — G609 Hereditary and idiopathic neuropathy, unspecified: Secondary | ICD-10-CM

## 2019-06-13 DIAGNOSIS — G894 Chronic pain syndrome: Secondary | ICD-10-CM

## 2019-06-13 MED ORDER — OXYCODONE-ACETAMINOPHEN 5-325 MG PO TABS: ORAL_TABLET | ORAL | 0 refills | Status: DC

## 2019-06-13 NOTE — Progress Notes (Signed)
Subjective:    Patient ID: [name], female    DOB: [birth year], 53 y.o.   MRN: [id number]  HPI This patient is homebound.  She has a degenerative neurologic disease along with contractures and inability to use her muscles well She does have severe neuropathic pain for which she takes the Percocet does not cause drowsiness.  Does allow her to have a better quality of life.  The patient is eating okay breathing okay no significant swelling in the legs. This patient was seen today for chronic pain  The medication list was reviewed and updated.   -Compliance with medication: yes  - Number patient states they take daily: 6  -when was the last dose patient took? today  The patient was advised the importance of maintaining medication and not using illegal substances with these.  Here for refills and follow up  The patient was educated that we can provide 3 monthly scripts for their medication, it is their responsibility to follow the instructions.  Side effects or complications from medications: none  Patient is aware that pain medications are meant to minimize the severity of the pain to allow their pain levels to improve to allow for better function. They are aware of that pain medications cannot totally remove their pain.  Virtual Visit via Video Note  I connected with [name] on 06/13/19 at 11:00 AM EDT by a video enabled telemedicine application and verified that I am speaking with the correct person using two identifiers.  Location: Patient: home Provider: office   I discussed the limitations of evaluation and management by telemedicine and the availability of in person appointments. The patient expressed understanding and agreed to proceed.  History of Present Illness:    Observations/Objective:   Assessment and Plan:   Follow Up Instructions:    I discussed the assessment and treatment plan with the patient. The patient was provided an opportunity to ask  questions and all were answered. The patient agreed with the plan and demonstrated an understanding of the instructions.   The patient was advised to call back or seek an in-person evaluation if the symptoms worsen or if the condition fails to improve as anticipated.  I provided 25 minutes of non-face-to-face time during this encounter.  Significant amount of time spent with the patient direct in conversation with her as well as her husband who was present with the video discussion  25 minutes was spent with the patient.  This statement verifies that 25 minutes was indeed spent with the patient.  More than 50% of this visit-total duration of the visit-was spent in counseling and coordination of care. The issues that the patient came in for today as reflected in the diagnosis (s) please refer to documentation for further details.         Review of Systems  Constitutional: Negative for activity change and appetite change.  HENT: Negative for congestion and rhinorrhea.   Respiratory: Negative for cough and shortness of breath.   Cardiovascular: Negative for chest pain and leg swelling.  Gastrointestinal: Negative for abdominal pain, nausea and vomiting.  Musculoskeletal: Positive for back pain and gait problem.  Skin: Negative for color change.  Neurological: Negative for dizziness and weakness.  Psychiatric/Behavioral: Negative for agitation and confusion.       Objective:   Physical Exam  Patient had virtual visit Appears to be in no distress Atraumatic Neuro able to relate and oriented No apparent resp distress Color normal  Assessment & Plan:  The patient was seen in followup for chronic pain. A review over at their current pain status was discussed. Drug registry was checked. Prescriptions were given. Discussion was held regarding the importance of compliance with medication as well as pain medication contract.  Time for questions regarding pain management plan  occurred. Importance of regular followup visits was discussed. Patient was informed that medication may cause drowsiness and should not be combined  with other medications/alcohol or street drugs. Patient was cautioned that medication could cause drowsiness. If the patient feels medication is causing altered alertness then do not drive or operate dangerous equipment. Should be noted that this patient also has hypercoagulability issues she is on blood thinner there is no sign of any type of bleeding issues  Should also be noted that this patient has debilitating neuropathy associated with all of this as well her long-term prognosis is poor she is at high risk for COVID complications family doing a good job of protecting her  With her level of pain her medication is indicated allows her to function better  Patient has become homebound progressive neurologic illness we will do a home visit in approximately 2 months

## 2019-06-29 ENCOUNTER — Telehealth: Payer: Self-pay | Admitting: Family Medicine

## 2019-06-29 NOTE — Telephone Encounter (Signed)
Nurses Please talk with patient and/or husband  Current guidelines are stating that gabapentin should not be used with pain medication because of increased risk of respiratory distress and depression which could put a person at risk for accidentally dying  I would recommend gradually tapering down on the gabapentin According to the records currently using 2 in the morning 2 in the afternoon and 2 in the evening I would recommend dropping 1 pill/week until the patient is on 1 pill take taken 3 times daily  Then at that point I would recommend reducing it down to 1 pill twice a day Then by that point we will be doing a home visit in early November and can discuss this further

## 2019-06-30 NOTE — Telephone Encounter (Signed)
Left message to return call 

## 2019-07-01 ENCOUNTER — Other Ambulatory Visit: Payer: Self-pay | Admitting: Family Medicine

## 2019-07-02 NOTE — Telephone Encounter (Signed)
Left message to return call 

## 2019-07-02 NOTE — Telephone Encounter (Signed)
Pt boyfriend returned call and verbalized understanding.

## 2019-08-15 ENCOUNTER — Ambulatory Visit: Payer: Medicare Other | Admitting: Family Medicine

## 2019-08-15 ENCOUNTER — Ambulatory Visit: Payer: BC Managed Care – PPO | Admitting: Family Medicine

## 2019-08-15 DIAGNOSIS — Z23 Encounter for immunization: Secondary | ICD-10-CM | POA: Diagnosis not present

## 2019-08-15 DIAGNOSIS — G894 Chronic pain syndrome: Secondary | ICD-10-CM | POA: Diagnosis not present

## 2019-08-15 DIAGNOSIS — F324 Major depressive disorder, single episode, in partial remission: Secondary | ICD-10-CM

## 2019-08-15 DIAGNOSIS — D6859 Other primary thrombophilia: Secondary | ICD-10-CM | POA: Diagnosis not present

## 2019-08-15 DIAGNOSIS — R29898 Other symptoms and signs involving the musculoskeletal system: Secondary | ICD-10-CM | POA: Diagnosis not present

## 2019-08-20 NOTE — Progress Notes (Signed)
Subjective:     Patient ID: [name], female   DOB: 12-07-1965, 53 y.o.   MRN: [id number]  HPI Patient was seen for home visit Has neuromuscular weakness Needs follow-up Also with chronic pain and discomfort Patient has severe neuromuscular degenerative condition This patient was seen today for chronic pain Face-to-face-patient needs pull-ups and diapers through [employer] will have staff talk with husband to help order what we can The medication list was reviewed and updated. Face-to-face evaluation Patient is homebound Would benefit from having blood drawn.  There is no way to get her to the hospital or lab We will see if potentially lab can be drawn by home health uncertain  -Compliance with medication: She does do a good job taking her medicine her husband sees to it they do not exceed their daily dosing She takes up this 6/day - Number patient states they take daily: 6/day  -when was the last dose patient took?  Earlier today  The patient was advised the importance of maintaining medication and not using illegal substances with these.  Here for refills and follow up  The patient was educated that we can provide 3 monthly scripts for their medication, it is their responsibility to follow the instructions.  Side effects or complications from medications: Denies any side effects states without the medicine has difficult time functioning  Patient is aware that pain medications are meant to minimize the severity of the pain to allow their pain levels to improve to allow for better function. They are aware of that pain medications cannot totally remove their pain.  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  HENT: Negative for congestion and rhinorrhea.   Respiratory: Negative for cough and shortness of breath.   Cardiovascular: Negative for chest pain and leg swelling.  Gastrointestinal: Negative for abdominal pain and diarrhea.  Endocrine: Negative for polydipsia and polyphagia.  Skin: Negative for color change.  Neurological: Negative for dizziness and weakness.  Psychiatric/Behavioral: Negative for behavioral problems and confusion.       Objective:   Physical Exam Vitals signs reviewed.  Constitutional:      General: She is not in acute distress. HENT:     Head: Normocephalic and atraumatic.  Eyes:     General:        Right eye: No discharge.        Left eye: No discharge.  Neck:     Trachea: No tracheal deviation.  Cardiovascular:     Rate and Rhythm: Normal rate and regular rhythm.     Heart sounds: Normal heart sounds. No murmur.  Pulmonary:     Effort: Pulmonary effort is normal. No respiratory distress.     Breath sounds: Normal breath sounds.  Lymphadenopathy:     Cervical: No cervical adenopathy.  Skin:    General: Skin is warm and dry.  Neurological:     Mental Status: She is alert.     Coordination: Coordination normal.  Psychiatric:  Behavior: Behavior normal.        Assessment:     Patient was seen for multiple chronic health issues see below     The     1. Primary hypercoagulable state ([place]) Continue anticoagulant no sign of blood clot  2. Weakness of both legs Patient is permanently disabled  3. Chronic pain syndrome Significant pain requires medication to help keep it in check  4. Major depressive disorder with single episode, in partial remission ([place]) Unfortunately deals with depression as well on  medications  5. Need for vaccination Flu shot today - Flu Vaccine QUAD 6+ mos PF IM (Fluarix Quad PF) 35 minutes to 40 minutes spent with patient regarding multiple chronic health issues

## 2019-08-22 NOTE — Progress Notes (Signed)
Spoke with patient. Pt states she does not have a  Home health nurse. Should we place a referral for home health? How should we handle the labs since no home health at this time. Script for depends written out, awaiting signature. Please advise. Thank you

## 2019-08-22 NOTE — Progress Notes (Signed)
Pt husband called back. Pt is needing a script for pull ups and pads. Pt does not have home health at this time. Husband states that when provider came out to home, provider stated that he would put in a referral. Please advise. Thank you

## 2019-08-24 MED ORDER — OXYCODONE-ACETAMINOPHEN 5-325 MG PO TABS: ORAL_TABLET | ORAL | 0 refills | Status: DC

## 2019-09-02 ENCOUNTER — Other Ambulatory Visit: Payer: Self-pay | Admitting: *Deleted

## 2019-09-02 DIAGNOSIS — G709 Myoneural disorder, unspecified: Secondary | ICD-10-CM

## 2019-09-02 NOTE — Progress Notes (Signed)
Home health referral put in.

## 2019-09-02 NOTE — Progress Notes (Signed)
error 

## 2019-09-11 ENCOUNTER — Encounter: Payer: Self-pay | Admitting: Family Medicine

## 2019-09-11 DIAGNOSIS — R7303 Prediabetes: Secondary | ICD-10-CM | POA: Diagnosis not present

## 2019-09-11 DIAGNOSIS — Z8673 Personal history of transient ischemic attack (TIA), and cerebral infarction without residual deficits: Secondary | ICD-10-CM | POA: Diagnosis not present

## 2019-09-11 DIAGNOSIS — G7089 Other specified myoneural disorders: Secondary | ICD-10-CM | POA: Diagnosis not present

## 2019-09-11 DIAGNOSIS — Z7901 Long term (current) use of anticoagulants: Secondary | ICD-10-CM | POA: Diagnosis not present

## 2019-09-11 DIAGNOSIS — Z79891 Long term (current) use of opiate analgesic: Secondary | ICD-10-CM | POA: Diagnosis not present

## 2019-09-11 DIAGNOSIS — M4140 Neuromuscular scoliosis, site unspecified: Secondary | ICD-10-CM | POA: Diagnosis not present

## 2019-09-11 DIAGNOSIS — D6859 Other primary thrombophilia: Secondary | ICD-10-CM | POA: Diagnosis not present

## 2019-09-11 DIAGNOSIS — G4736 Sleep related hypoventilation in conditions classified elsewhere: Secondary | ICD-10-CM | POA: Diagnosis not present

## 2019-09-11 DIAGNOSIS — F324 Major depressive disorder, single episode, in partial remission: Secondary | ICD-10-CM | POA: Diagnosis not present

## 2019-09-11 DIAGNOSIS — Z9181 History of falling: Secondary | ICD-10-CM | POA: Diagnosis not present

## 2019-09-11 DIAGNOSIS — G894 Chronic pain syndrome: Secondary | ICD-10-CM | POA: Diagnosis not present

## 2019-09-11 DIAGNOSIS — G8222 Paraplegia, incomplete: Secondary | ICD-10-CM | POA: Diagnosis not present

## 2019-09-15 ENCOUNTER — Other Ambulatory Visit: Payer: Self-pay | Admitting: Family Medicine

## 2019-09-16 ENCOUNTER — Other Ambulatory Visit: Payer: Self-pay | Admitting: Family Medicine

## 2019-09-17 ENCOUNTER — Telehealth: Payer: Self-pay | Admitting: Family Medicine

## 2019-09-17 NOTE — Telephone Encounter (Signed)
May have approaval

## 2019-09-17 NOTE — Telephone Encounter (Signed)
Larene Beach with [facility] called to request verbal orders for pt  Once a week for 3 weeks for home health nurse & a social worker eval  Please advise & call Larene Beach [phone], [place] to leave message

## 2019-09-17 NOTE — Telephone Encounter (Signed)
Verbal orders give to [facility] at [facility]

## 2019-09-18 ENCOUNTER — Telehealth: Payer: Self-pay | Admitting: Family Medicine

## 2019-09-18 DIAGNOSIS — R7303 Prediabetes: Secondary | ICD-10-CM | POA: Diagnosis not present

## 2019-09-18 DIAGNOSIS — G8222 Paraplegia, incomplete: Secondary | ICD-10-CM | POA: Diagnosis not present

## 2019-09-18 DIAGNOSIS — Z7901 Long term (current) use of anticoagulants: Secondary | ICD-10-CM | POA: Diagnosis not present

## 2019-09-18 DIAGNOSIS — G7089 Other specified myoneural disorders: Secondary | ICD-10-CM | POA: Diagnosis not present

## 2019-09-18 DIAGNOSIS — G4736 Sleep related hypoventilation in conditions classified elsewhere: Secondary | ICD-10-CM | POA: Diagnosis not present

## 2019-09-18 DIAGNOSIS — Z8673 Personal history of transient ischemic attack (TIA), and cerebral infarction without residual deficits: Secondary | ICD-10-CM | POA: Diagnosis not present

## 2019-09-18 DIAGNOSIS — G894 Chronic pain syndrome: Secondary | ICD-10-CM | POA: Diagnosis not present

## 2019-09-18 DIAGNOSIS — Z9181 History of falling: Secondary | ICD-10-CM | POA: Diagnosis not present

## 2019-09-18 DIAGNOSIS — Z79891 Long term (current) use of opiate analgesic: Secondary | ICD-10-CM | POA: Diagnosis not present

## 2019-09-18 DIAGNOSIS — F324 Major depressive disorder, single episode, in partial remission: Secondary | ICD-10-CM | POA: Diagnosis not present

## 2019-09-18 DIAGNOSIS — D6859 Other primary thrombophilia: Secondary | ICD-10-CM | POA: Diagnosis not present

## 2019-09-18 NOTE — Telephone Encounter (Signed)
Patient is requesting refill on gabapentin 300 mg. [employer]

## 2019-09-19 NOTE — Telephone Encounter (Signed)
Prescription was sent in thank you

## 2019-09-20 NOTE — Telephone Encounter (Signed)
Duplicate please remove

## 2019-09-26 DIAGNOSIS — Z9181 History of falling: Secondary | ICD-10-CM | POA: Diagnosis not present

## 2019-09-26 DIAGNOSIS — Z79891 Long term (current) use of opiate analgesic: Secondary | ICD-10-CM | POA: Diagnosis not present

## 2019-09-26 DIAGNOSIS — F324 Major depressive disorder, single episode, in partial remission: Secondary | ICD-10-CM | POA: Diagnosis not present

## 2019-09-26 DIAGNOSIS — Z7901 Long term (current) use of anticoagulants: Secondary | ICD-10-CM | POA: Diagnosis not present

## 2019-09-26 DIAGNOSIS — G4736 Sleep related hypoventilation in conditions classified elsewhere: Secondary | ICD-10-CM | POA: Diagnosis not present

## 2019-09-26 DIAGNOSIS — G7089 Other specified myoneural disorders: Secondary | ICD-10-CM | POA: Diagnosis not present

## 2019-09-26 DIAGNOSIS — D6859 Other primary thrombophilia: Secondary | ICD-10-CM | POA: Diagnosis not present

## 2019-09-26 DIAGNOSIS — G894 Chronic pain syndrome: Secondary | ICD-10-CM | POA: Diagnosis not present

## 2019-09-26 DIAGNOSIS — G8222 Paraplegia, incomplete: Secondary | ICD-10-CM | POA: Diagnosis not present

## 2019-09-26 DIAGNOSIS — R7303 Prediabetes: Secondary | ICD-10-CM | POA: Diagnosis not present

## 2019-09-26 DIAGNOSIS — Z8673 Personal history of transient ischemic attack (TIA), and cerebral infarction without residual deficits: Secondary | ICD-10-CM | POA: Diagnosis not present

## 2019-10-07 DIAGNOSIS — G8222 Paraplegia, incomplete: Secondary | ICD-10-CM | POA: Diagnosis not present

## 2019-10-07 DIAGNOSIS — G7089 Other specified myoneural disorders: Secondary | ICD-10-CM | POA: Diagnosis not present

## 2019-10-07 DIAGNOSIS — D6859 Other primary thrombophilia: Secondary | ICD-10-CM | POA: Diagnosis not present

## 2019-10-09 ENCOUNTER — Telehealth: Payer: Self-pay | Admitting: Family Medicine

## 2019-10-09 MED ORDER — POTASSIUM CHLORIDE ER 10 MEQ PO TBCR: EXTENDED_RELEASE_TABLET | ORAL | 4 refills | Status: DC

## 2019-10-09 NOTE — Telephone Encounter (Signed)
Left message to return call 

## 2019-10-09 NOTE — Telephone Encounter (Signed)
Please communicate with her husband  I received lab work from home health that was completed at the start of December Potassium is slightly high at 5.3 calcium is low at 8.0 Protein is slightly low at 5.2 which is common with individuals who are bedridden  As for the potassium I recommend reducing her potassium to 1 capsule a day As for the calcium I recommend 600 mg of calcium twice daily this is available OTC Please go ahead and make the changes in the medicine list and notify husband to make the changes as well

## 2019-10-09 NOTE — Addendum Note (Signed)
Addended by: Vicente Males on: 10/09/2019 10:50 AM   Modules accepted: Orders

## 2019-10-09 NOTE — Telephone Encounter (Signed)
Patient husband returned call and verbalized understanding. Potassium dose changed in Epic to one per day.

## 2019-10-23 ENCOUNTER — Other Ambulatory Visit: Payer: Self-pay | Admitting: Family Medicine

## 2019-10-23 NOTE — Telephone Encounter (Signed)
4 months on each please

## 2019-10-23 NOTE — Telephone Encounter (Signed)
Home visit done on 08/15/19

## 2019-10-27 ENCOUNTER — Other Ambulatory Visit: Payer: Self-pay | Admitting: Family Medicine

## 2019-10-30 DIAGNOSIS — G7089 Other specified myoneural disorders: Secondary | ICD-10-CM | POA: Diagnosis not present

## 2019-10-30 DIAGNOSIS — D6859 Other primary thrombophilia: Secondary | ICD-10-CM | POA: Diagnosis not present

## 2019-10-30 DIAGNOSIS — G8222 Paraplegia, incomplete: Secondary | ICD-10-CM | POA: Diagnosis not present

## 2019-11-11 ENCOUNTER — Other Ambulatory Visit: Payer: Self-pay | Admitting: Family Medicine

## 2019-11-11 NOTE — Telephone Encounter (Signed)
Home visit on 08/15/19

## 2019-11-19 ENCOUNTER — Other Ambulatory Visit: Payer: Self-pay | Admitting: Family Medicine

## 2019-11-24 ENCOUNTER — Other Ambulatory Visit: Payer: Self-pay | Admitting: Family Medicine

## 2019-11-24 NOTE — Telephone Encounter (Signed)
May have 1 month with 2 additional refills on each

## 2019-12-02 ENCOUNTER — Other Ambulatory Visit: Payer: Self-pay

## 2019-12-02 ENCOUNTER — Ambulatory Visit (INDEPENDENT_AMBULATORY_CARE_PROVIDER_SITE_OTHER): Payer: BC Managed Care – PPO | Admitting: Family Medicine

## 2019-12-02 DIAGNOSIS — Z760 Encounter for issue of repeat prescription: Secondary | ICD-10-CM

## 2019-12-02 DIAGNOSIS — G894 Chronic pain syndrome: Secondary | ICD-10-CM

## 2019-12-02 MED ORDER — OXYCODONE-ACETAMINOPHEN 5-325 MG PO TABS: ORAL_TABLET | ORAL | 0 refills | Status: DC

## 2019-12-02 NOTE — Progress Notes (Signed)
Subjective:    Patient ID: [name], female    DOB: January 30, 1966, 54 y.o.   MRN: [id number]  HPI This patient was seen today for chronic pain  The medication list was reviewed and updated.   -Compliance with medication: oxycodone 5-325 mg  - Number patient states they take daily: 6 Pain.  No known okay so far all yes very much okay look forward to talk -when was the last dose patient took? About an hour ago  The patient was advised the importance of maintaining medication and not using illegal substances with these.  Here for refills and follow up  The patient was educated that we can provide 3 monthly scripts for their medication, it is their responsibility to follow the instructions.  Side effects or complications from medications: none  Patient is aware that pain medications are meant to minimize the severity of the pain to allow their pain levels to improve to allow for better function. They are aware of that pain medications cannot totally remove their pain.  Due for UDT ( at least once per year) : Unable to do currently patient at home  Virtual Visit via Telephone Note  I connected with Suzzanne Cloud on 12/02/19 at  2:30 PM EST by telephone and verified that I am speaking with the correct person using two identifiers.  Location: Patient: home Provider: office   I discussed the limitations, risks, security and privacy concerns of performing an evaluation and management service by telephone and the availability of in person appointments. I also discussed with the patient that there may be a patient responsible charge related to this service. The patient expressed understanding and agreed to proceed.   History of Present Illness:    Observations/Objective:   Assessment and Plan:   Follow Up Instructions:    I discussed the assessment and treatment plan with the patient. The patient was provided an opportunity to ask questions and all were answered. The patient  agreed with the plan and demonstrated an understanding of the instructions.   The patient was advised to call back or seek an in-person evaluation if the symptoms worsen or if the condition fails to improve as anticipated.  I provided 20 minutes of non-face-to-face time during this encounter.        Review of Systems  Constitutional: Negative for activity change and appetite change.  HENT: Negative for congestion and rhinorrhea.   Respiratory: Negative for cough and shortness of breath.   Cardiovascular: Negative for chest pain and leg swelling.  Gastrointestinal: Negative for abdominal pain, nausea and vomiting.  Skin: Negative for color change.  Neurological: Negative for dizziness and weakness.  Psychiatric/Behavioral: Negative for agitation and confusion.       Objective:   Physical Exam  Today's visit was via telephone Physical exam was not possible for this visit       Assessment & Plan:  The patient was seen in followup for chronic pain. A review over at their current pain status was discussed. Drug registry was checked. Prescriptions were given. Discussion was held regarding the importance of compliance with medication as well as pain medication contract.  Time for questions regarding pain management plan occurred. Importance of regular followup visits was discussed. Patient was informed that medication may cause drowsiness and should not be combined  with other medications/alcohol or street drugs. Patient was cautioned that medication could cause drowsiness. If the patient feels medication is causing altered alertness then do not drive or operate dangerous equipment.  Patient has degenerative neuro condition she also has chronic pain neither 1 of these will get better over time she responsibly uses pain medicine her husband sets out 1 day at a time I have talked with the family how I do not feel gabapentin is doing much good and I would recommend tapering it down  currently they are utilizing 1 capsule 4 times a day they will cut it down to 1 capsule 3 times a day I will write out a game plan to taper off of the medicine but it could take multiple weeks to do so we will shift over to 100 mg capsules at this point  The next visit we do with this patient will be a home visit in June  We will be reducing the gabapentin that the patient is taking I will map out a plan for the

## 2019-12-16 NOTE — Progress Notes (Signed)
Neck step would be reducing into 1 in the morning 1 in the afternoon 1 in the evening thank you please put the change into the epic med list please

## 2019-12-16 NOTE — Progress Notes (Signed)
Pt spouse states that pt is taking 4 Gabapentin per day. Pt takes one in the morning, one around noon and 2 at night. Please advise. Thank you

## 2019-12-17 ENCOUNTER — Other Ambulatory Visit: Payer: Self-pay | Admitting: *Deleted

## 2019-12-17 MED ORDER — GABAPENTIN 300 MG PO CAPS: ORAL_CAPSULE | ORAL | 0 refills | Status: DC

## 2019-12-17 NOTE — Progress Notes (Signed)
This is for the gabapentin and I called and discussed with pt. She verbalized understanding and med list updated.

## 2019-12-24 ENCOUNTER — Telehealth: Payer: Self-pay | Admitting: *Deleted

## 2019-12-24 NOTE — Telephone Encounter (Signed)
Called and spoke with Ron Parker at [facility] [phone] and had pt added to the list for covid vaccines for pts that are home bound. She states she cannot tell me when they will be out but she is on the list and mark is welcome to call her on this number for updates. I called and spoke with mark and gave him kimberly's number to call if he hasnt heard back within a week or so.

## 2019-12-24 NOTE — Telephone Encounter (Signed)
Good thank you

## 2020-01-28 DIAGNOSIS — Z23 Encounter for immunization: Secondary | ICD-10-CM | POA: Diagnosis not present

## 2020-01-30 ENCOUNTER — Other Ambulatory Visit: Payer: Self-pay | Admitting: Family Medicine

## 2020-02-20 DIAGNOSIS — Z23 Encounter for immunization: Secondary | ICD-10-CM | POA: Diagnosis not present

## 2020-02-25 ENCOUNTER — Other Ambulatory Visit: Payer: Self-pay | Admitting: Family Medicine

## 2020-02-26 NOTE — Telephone Encounter (Signed)
This medication could interact with her other medicines we will need to taper this down please inform patient and family therefore next dose 100 mg 1 nightly daily, #30 no refills

## 2020-02-27 NOTE — Telephone Encounter (Signed)
Left message to return call 

## 2020-03-04 NOTE — Telephone Encounter (Signed)
Left message to return call 

## 2020-03-05 NOTE — Telephone Encounter (Signed)
Discussed with [name] and he states she will not be able to sleep at night and wants to know if there are any other options

## 2020-03-10 ENCOUNTER — Ambulatory Visit: Payer: BC Managed Care – PPO | Admitting: Family Medicine

## 2020-03-10 ENCOUNTER — Other Ambulatory Visit: Payer: Self-pay | Admitting: Family Medicine

## 2020-03-10 ENCOUNTER — Other Ambulatory Visit: Payer: Self-pay

## 2020-03-10 DIAGNOSIS — L659 Nonscarring hair loss, unspecified: Secondary | ICD-10-CM

## 2020-03-10 DIAGNOSIS — R7303 Prediabetes: Secondary | ICD-10-CM | POA: Diagnosis not present

## 2020-03-10 DIAGNOSIS — G4709 Other insomnia: Secondary | ICD-10-CM | POA: Diagnosis not present

## 2020-03-10 DIAGNOSIS — D6859 Other primary thrombophilia: Secondary | ICD-10-CM

## 2020-03-10 DIAGNOSIS — G894 Chronic pain syndrome: Secondary | ICD-10-CM | POA: Diagnosis not present

## 2020-03-10 DIAGNOSIS — R600 Localized edema: Secondary | ICD-10-CM

## 2020-03-10 DIAGNOSIS — G8929 Other chronic pain: Secondary | ICD-10-CM | POA: Diagnosis not present

## 2020-03-10 NOTE — Progress Notes (Signed)
   Subjective:    Patient ID: [name], female    DOB: [birth year], 54 y.o.   MRN: [id number]  HPI This patient was seen today for chronic pain  The medication list was reviewed and updated.   -Compliance with medication: takes 6 per day  - Number patient states they take daily:  6 per day -when was the last dose patient took? today  The patient was advised the importance of maintaining medication and not using illegal substances with these.  Here for refills and follow up  The patient was educated that we can provide 3 monthly scripts for their medication, it is their responsibility to follow the instructions.  Side effects or complications from medications: none  Patient is aware that pain medications are meant to minimize the severity of the pain to allow their pain levels to improve to allow for better function. They are aware of that pain medications cannot totally remove their pain.  Due for UDT ( at least once per year) :   Still itching on legs. Hydrocortisone not helping  Face is dry and causing her to break out.         Review of Systems Relates a lot of pain in her arms and legs.  Relates difficulty sleeping.  States pain medicine helps but does not totally take away the problem    Objective:   Physical Exam Lungs clear heart is regular atopic dermatitis noted on her face Patient bedbound due to neurologic condition in her arms and legs       Assessment & Plan:  The patient was seen in followup for chronic pain. A review over at their current pain status was discussed. Drug registry was checked. Prescriptions were given.  Regular follow-up recommended. Discussion was held regarding the importance of compliance with medication as well as pain medication contract.  Patient was informed that medication may cause drowsiness and should not be combined  with other medications/alcohol or street drugs. If the patient feels medication is causing altered  alertness then do not drive or operate dangerous equipment.  1. Primary hypercoagulable state ([place]) Continue anticoagulant.  No bleeding issues.  2. Chronic pain syndrome See above pain medicine does allow her to function better without it her pain will be intolerable continue current medications  3. Pedal edema Significant pedal edema in the past under good control currently.  4. Prediabetes Watch starches in diet stay physically active  5. Other insomnia Continue trazodone taper off of sertraline because of drug interaction  6. Hair loss I think the hair loss is just some changes associated with estrogen conversion no need to do TSH currently  Ended up sending in Elidel to try for her facial eczema

## 2020-03-11 NOTE — Telephone Encounter (Signed)
May have 5 months on refill, patient is stopping sertraline, may remove sertraline from list of medicines

## 2020-03-11 NOTE — Telephone Encounter (Signed)
This is been reduced down to 1 taken 3 times daily, #90, 5 refills

## 2020-03-13 ENCOUNTER — Other Ambulatory Visit: Payer: Self-pay | Admitting: Family Medicine

## 2020-03-15 ENCOUNTER — Telehealth: Payer: Self-pay | Admitting: Family Medicine

## 2020-03-15 ENCOUNTER — Other Ambulatory Visit: Payer: Self-pay | Admitting: Family Medicine

## 2020-03-15 MED ORDER — OXYCODONE-ACETAMINOPHEN 5-325 MG PO TABS: ORAL_TABLET | ORAL | 0 refills | Status: DC

## 2020-03-15 NOTE — Telephone Encounter (Signed)
Patient is checking on oxycodone prescription that was supposed to be called in on 6/2 after her visit. Patient's boyfriend went to pick up at drug store and its wasn't their. [employer]

## 2020-03-15 NOTE — Telephone Encounter (Signed)
Home visit last week

## 2020-03-15 NOTE — Telephone Encounter (Signed)
Pt.notified

## 2020-03-15 NOTE — Telephone Encounter (Signed)
Prescriptions were sent in as requested

## 2020-03-15 NOTE — Telephone Encounter (Signed)
Please advise. Thank you

## 2020-03-26 ENCOUNTER — Other Ambulatory Visit: Payer: Self-pay | Admitting: Family Medicine

## 2020-03-26 NOTE — Telephone Encounter (Signed)
Check up 03/10/20

## 2020-03-31 ENCOUNTER — Telehealth: Payer: Self-pay | Admitting: Family Medicine

## 2020-03-31 NOTE — Telephone Encounter (Signed)
This entry is for documentation purposes Patient is on gabapentin as well as opioids. She is consistent with taking her medicine. Her husband helps administer these. Patient has painful neurologic condition. This combination does not make her dopey or drowsy. Family denies any evidence of her being sedated or drug.

## 2020-04-02 ENCOUNTER — Telehealth: Payer: Self-pay | Admitting: Family Medicine

## 2020-04-02 MED ORDER — HYDROCORTISONE 2.5 % EX CREA: TOPICAL_CREAM | Freq: Two times a day (BID) | CUTANEOUS | 2 refills | Status: DC | PRN

## 2020-04-02 NOTE — Telephone Encounter (Signed)
Prescription was sent electronically to pharmacy. Patient notified and stated she does not wan that cream because it does not help but she talked to Dr Nicki Reaper at her home visit and he was going to call a dermatologist to get some better cream for her -Please advise

## 2020-04-02 NOTE — Telephone Encounter (Signed)
Requesting refill on hydrocortisone cream.

## 2020-04-02 NOTE — Telephone Encounter (Signed)
Patient is requesting refill on face cream.She couldn't remember the name. She had a home visit on 6/2. [employer]

## 2020-04-02 NOTE — Telephone Encounter (Signed)
Hydrocortisone 2.5% applied twice daily as needed for up to 2 weeks at a time, 30 g, 2 refills

## 2020-04-04 NOTE — Telephone Encounter (Signed)
Stop steroid cream Use Elidel Apply thin amount twice daily to the affected areas for up to the next 2 weeks.  Use moisturizing lotion in the evening.  Or Vaseline.  If not seeing any improvement over the next couple weeks let us know.

## 2020-04-05 ENCOUNTER — Other Ambulatory Visit: Payer: Self-pay | Admitting: *Deleted

## 2020-04-05 MED ORDER — PIMECROLIMUS 1 % EX CREA
TOPICAL_CREAM | CUTANEOUS | 0 refills | Status: DC
Start: 2020-04-05 — End: 2020-08-29

## 2020-04-05 NOTE — Telephone Encounter (Signed)
Diagnosis atopic dermatitis, eczema Failed to respond to hydration lotions, Vaseline lotions and steroid creams

## 2020-04-05 NOTE — Telephone Encounter (Signed)
Med approved 

## 2020-04-05 NOTE — Telephone Encounter (Signed)
Working on PA

## 2020-04-05 NOTE — Telephone Encounter (Signed)
Patient notified, rx on Dr. Bary Leriche door to sign.

## 2020-04-05 NOTE — Telephone Encounter (Signed)
Cream requires PA -- I see in the last note she has dry skin and breaking out on her face -- please advise diagnosis to complete PA.

## 2020-04-12 ENCOUNTER — Telehealth: Payer: Self-pay | Admitting: Family Medicine

## 2020-04-12 NOTE — Telephone Encounter (Signed)
Nurses Please call patient's husband [name]  Please let him know that combination of gabapentin and oxycodone is no longer considered safe by current medical standards.  It would be necessary to taper 1 or the other medication.  I certainly recognize that the patient has been on these medications for a span of time but it is no longer considered safe by current standards to be on both medications so therefore in order to adhere to current guidelines and maintain safety for the patient we will need to taper 1 or the other.  Based on knowledge of the patient I would assume family/patient would desire to taper gabapentin and maintain oxycodone. If that be the case I can map out a tapering dose, mail them a letter to detail this, and call in a new prescription of the gabapentin. Essentially we would use 100 mg gabapentin's to gradually taper down the dose over the course of the next several weeks (Please talk with the husband, document, send me update of the conversation thank you)

## 2020-04-13 NOTE — Telephone Encounter (Signed)
Spoke with [name] (DPR) and he stated they would like to stay on oxycodone and taper off of the gabapentin. [name] states the patient has successfully cut down from Gabapentin 300mg  6 pills a day to only 3 pills a day currently and would like the new tapering prescriptions sent to [facility].

## 2020-04-18 NOTE — Telephone Encounter (Signed)
Nurses Patient will be on tapering dose of gabapentin her agreement Please send in prescription for gabapentin 100 mg Directions on the bottle 3 pills 3 times daily but tapering dose per routine guidelines Under the note to pharmacy please put on there to: stop the 300 mg gabapentin and taper dose per routine guidelines using 100 mg capsules May be for 182 tablets, fill date 04/19/2020 no refills  Then send in a second prescription for gabapentin 100 mg capsule Directions 2 pills in the morning 1 mid afternoon and 1 in the evening following tapering dose written guidelines Note to pharmacy tapering off of gabapentin This can be for 70 tablets With a fill date of May 17, 2020  Please let [name] know that we are sending in 2 separate prescriptions the first 1 will be for a larger quantity but as he tapers the second 1 is for small quantity Please tell the pharmacy to stop the 300 mg gabapentin Please mail the tapering guidelines to [name] He can start these guidelines when he gets the prescription filled and he receives the letter in the mail which should be this week  Thank you-Dr. Nicki Reaper if any questions let me know

## 2020-04-19 MED ORDER — GABAPENTIN 100 MG PO CAPS
ORAL_CAPSULE | ORAL | 0 refills | Status: DC
Start: 2020-04-19 — End: 2020-06-07

## 2020-04-19 MED ORDER — GABAPENTIN 100 MG PO CAPS
ORAL_CAPSULE | ORAL | 0 refills | Status: DC
Start: 2020-04-19 — End: 2020-08-29

## 2020-04-19 MED ORDER — GABAPENTIN 100 MG PO CAPS
300.0000 mg | ORAL_CAPSULE | Freq: Three times a day (TID) | ORAL | 0 refills | Status: DC
Start: 2020-04-19 — End: 2020-08-29

## 2020-04-19 NOTE — Telephone Encounter (Signed)
So it is good to hear that he has been able to be progressive about this. I would recommend the patient disregard the previous instructions being mailed  Instead follow the following recommendations Gabapentin 100 mg, 1 taken twice daily for 1 week Then gabapentin 100 mg 1 daily for 1 week Then stop  Canceled the other gabapentin prescriptions that were sent to the pharmacy Inform pharmacist that family has been able to rapidly taper on their own And send in a prescription for 21 capsules which will cover the above  If any problems let me know

## 2020-04-19 NOTE — Addendum Note (Signed)
Addended by: Vicente Males on: 04/19/2020 04:35 PM   Modules accepted: Orders

## 2020-04-19 NOTE — Telephone Encounter (Signed)
Left message to return call. New Gabapentin script sent in. Contacted pharmacy to cancel earlier scripts

## 2020-04-19 NOTE — Telephone Encounter (Signed)
Prescriptions sent electronically to pharmacy. Telephone voicemail full to notify Mark(DPR)- letter with tapering instructions mailed

## 2020-04-19 NOTE — Telephone Encounter (Signed)
Patient states he has been tapering her off on his owns since last phone call and she is currently only taking Gabapentin 300mg  one tablet a day

## 2020-04-19 NOTE — Addendum Note (Signed)
Addended by: Dairl Ponder on: 04/19/2020 08:58 AM   Modules accepted: Orders

## 2020-04-21 NOTE — Telephone Encounter (Signed)
Pt.notified

## 2020-04-26 ENCOUNTER — Telehealth: Payer: Self-pay | Admitting: Family Medicine

## 2020-04-26 ENCOUNTER — Other Ambulatory Visit: Payer: Self-pay | Admitting: *Deleted

## 2020-04-26 MED ORDER — CLOBETASOL PROPIONATE 0.05 % EX SOLN: 1.0000 "application " | Freq: Two times a day (BID) | CUTANEOUS | 2 refills | Status: DC

## 2020-04-26 NOTE — Telephone Encounter (Signed)
Patient boyfriend notified and rx sent to pharmacy.

## 2020-04-26 NOTE — Telephone Encounter (Signed)
Pt is wanting a med call in for her scalp it is itching. Pt is unable to get out of the bed.

## 2020-04-26 NOTE — Telephone Encounter (Signed)
Per boyfriend-- patients scalp has been itching for a while now and has recently gotten worse. She has not used anything new or done anything different. Looks like there may be a slight rash.

## 2020-04-26 NOTE — Telephone Encounter (Signed)
Clobetasol liquid apply twice daily as needed to the scalp for itching May send in a small bottle with 2 refills  Also recommend using Selsun Blue shampoo at least twice a week

## 2020-05-03 ENCOUNTER — Telehealth: Payer: Self-pay | Admitting: Family Medicine

## 2020-05-03 NOTE — Telephone Encounter (Signed)
[name] called in [place] finishes gabapentin (NEURONTIN) 100 MG capsule 06/27. Dr Nicki Reaper said he was taking her off of them and putting her on something else and they are wanting to know what type of med and when?   [name] [phone]

## 2020-05-03 NOTE — Telephone Encounter (Signed)
[name] states Dr. Nicki Reaper mentioned prescribing something in place of the gabapentin. Patient has done the 2 weeks of tapering off and only has enough for tomorrow. I did not see any mention of this but he said patient is not going to be okay with just the pain medication. Please advise.

## 2020-05-04 NOTE — Telephone Encounter (Signed)
I believe there is some misunderstanding.  What I had stated to the patient/husband was that gabapentin and pain medicine were no longer a combo that could be used together and that is why is recommended to taper off of the gabapentin  Ideally the patient will get by with using the pain medicine and Tylenol If having further troubles possibly we can figure something else that could be added But we have to take into account safety

## 2020-05-04 NOTE — Telephone Encounter (Signed)
Given how she is having such trouble with neuropathy We can reinitiate the gabapentin Ideally it would be wonderful if she did not need the medication but in her situation she does So therefore we need to initiate a lower dose than what she was on previously in order to try to stay as safe as possible  So therefore-gabapentin 300 mg capsule #90 with 3 refills 1 taken 3 times daily-we cannot go up on the dose from this

## 2020-05-04 NOTE — Telephone Encounter (Signed)
Husband states patient is not doing good at all and she is having to continue to take the gabapentin. She has had a lot of pain in her feet with the 100mg  tablets and he said there's no way she can go without taking it. Advised patients husband that this was a patient safety issue and that Dr. Nicki Reaper recommended taking tylenol in conjunction with her pain medication but he said there's no way she can deal with that and she is going to need something.

## 2020-05-05 ENCOUNTER — Other Ambulatory Visit: Payer: Self-pay | Admitting: *Deleted

## 2020-05-05 MED ORDER — GABAPENTIN 300 MG PO CAPS
300.0000 mg | ORAL_CAPSULE | Freq: Three times a day (TID) | ORAL | 3 refills | Status: DC
Start: 2020-05-05 — End: 2020-09-10

## 2020-05-05 NOTE — Telephone Encounter (Signed)
Med sent to pharmacy.

## 2020-05-05 NOTE — Progress Notes (Signed)
gaba

## 2020-05-05 NOTE — Telephone Encounter (Signed)
Lmtc

## 2020-06-07 ENCOUNTER — Telehealth (INDEPENDENT_AMBULATORY_CARE_PROVIDER_SITE_OTHER): Payer: BC Managed Care – PPO | Admitting: Family Medicine

## 2020-06-07 ENCOUNTER — Other Ambulatory Visit: Payer: Self-pay

## 2020-06-07 DIAGNOSIS — D6859 Other primary thrombophilia: Secondary | ICD-10-CM | POA: Diagnosis not present

## 2020-06-07 DIAGNOSIS — G8929 Other chronic pain: Secondary | ICD-10-CM | POA: Diagnosis not present

## 2020-06-07 MED ORDER — OXYCODONE-ACETAMINOPHEN 5-325 MG PO TABS: ORAL_TABLET | ORAL | 0 refills | Status: DC

## 2020-06-07 NOTE — Progress Notes (Signed)
Subjective:    Patient ID: [name], female    DOB: 19-Feb-1966, 54 y.o.   MRN: [id number]  HPI  This patient was seen today for chronic pain  The medication list was reviewed and updated.  Oxycodone 5-325mg     -Compliance with medication: yes  - Number patient states they take daily: 6  -when was the last dose patient took? This morning  The patient was advised the importance of maintaining medication and not using illegal substances with these.  Here for refills and follow up  The patient was educated that we can provide 3 monthly scripts for their medication, it is their responsibility to follow the instructions.  Side effects or complications from medications: none  Patient is aware that pain medications are meant to minimize the severity of the pain to allow their pain levels to improve to allow for better function. They are aware of that pain medications cannot totally remove their pain.  Due for UDT ( at least once per year) : 07/05/18  Scale of 1 to 10 ( 1 is least 10 is most) Your pain level without the medicine: 7 Your pain level with medication 3  Scale 1 to 10 ( 1-helps very little, 10 helps very well) How well does your pain medication reduce your pain so you can function better through out the day?  It makes the pain more bearable.  Patient is homebound and bedbound but it makes the pain more bearable to well with to where it does improve her quality of life     Virtual Visit via Video Note  I connected with Kamee Bobst on 06/07/20 at  2:00 PM EDT by a video enabled telemedicine application and verified that I am speaking with the correct person using two identifiers.  Location: Patient: home Provider: office   I discussed the limitations of evaluation and management by telemedicine and the availability of in person appointments. The patient expressed understanding and agreed to proceed.  History of Present Illness:     Observations/Objective:   Assessment and Plan:   Follow Up Instructions:    I discussed the assessment and treatment plan with the patient. The patient was provided an opportunity to ask questions and all were answered. The patient agreed with the plan and demonstrated an understanding of the instructions.   The patient was advised to call back or seek an in-person evaluation if the symptoms worsen or if the condition fails to improve as anticipated.  I provided 20 minutes of non-face-to-face time during this encounter.    Review of Systems Please see above    Objective:   Physical Exam   Patient had virtual visit Appears to be in no distress Atraumatic Neuro able to relate and oriented No apparent resp distress Color normal      Assessment & Plan:  The patient was seen in followup for chronic pain. A review over at their current pain status was discussed. Drug registry was checked. Prescriptions were given.  Regular follow-up recommended. Discussion was held regarding the importance of compliance with medication as well as pain medication contract.  Patient was informed that medication may cause drowsiness and should not be combined  with other medications/alcohol or street drugs. If the patient feels medication is causing altered alertness then do not drive or operate dangerous equipment.  We will send in refills on her medicine We will also do a home visit in October with flu shot Continue her other medicines as is.  In  this patient's situation I believe that the pain medicine outweighs any negatives.  I do feel that this patient would benefit from continuing her pain medicine.  We will make sure that she has a Narcan nasal spray.  We will do a home visit in October

## 2020-06-08 ENCOUNTER — Other Ambulatory Visit: Payer: Self-pay | Admitting: *Deleted

## 2020-06-08 MED ORDER — NALOXONE HCL 4 MG/0.1ML NA LIQD: NASAL | 0 refills | Status: AC

## 2020-06-17 ENCOUNTER — Other Ambulatory Visit: Payer: Self-pay | Admitting: Family Medicine

## 2020-06-23 ENCOUNTER — Telehealth: Payer: Self-pay | Admitting: Family Medicine

## 2020-06-23 NOTE — Telephone Encounter (Signed)
Patient wanting to know when she can have home visit or video visit with doctor next month please advise.

## 2020-06-30 NOTE — Telephone Encounter (Signed)
Please do week of Oct 4 at 4:40 slot home visit, if pt wants flu shot we can do that as well

## 2020-07-05 NOTE — Telephone Encounter (Signed)
Patient has appointment 10/4 for home visit at 4:20

## 2020-07-12 ENCOUNTER — Ambulatory Visit: Payer: BC Managed Care – PPO | Admitting: Family Medicine

## 2020-07-12 ENCOUNTER — Other Ambulatory Visit: Payer: Self-pay

## 2020-07-12 DIAGNOSIS — G8929 Other chronic pain: Secondary | ICD-10-CM | POA: Diagnosis not present

## 2020-07-12 DIAGNOSIS — Z23 Encounter for immunization: Secondary | ICD-10-CM

## 2020-07-12 DIAGNOSIS — N3000 Acute cystitis without hematuria: Secondary | ICD-10-CM | POA: Diagnosis not present

## 2020-07-12 DIAGNOSIS — D6859 Other primary thrombophilia: Secondary | ICD-10-CM | POA: Diagnosis not present

## 2020-07-12 MED ORDER — OXYCODONE-ACETAMINOPHEN 5-325 MG PO TABS: ORAL_TABLET | ORAL | 0 refills | Status: DC

## 2020-07-12 MED ORDER — NITROFURANTOIN MONOHYD MACRO 100 MG PO CAPS: 100.0000 mg | ORAL_CAPSULE | Freq: Two times a day (BID) | ORAL | 0 refills | Status: DC

## 2020-07-12 NOTE — Progress Notes (Signed)
   Subjective:    Patient ID: [name], female    DOB: 1966/01/18, 54 y.o.   MRN: [id number]  HPI Home visit Patient is homebound due to neuromuscular disease Patient has chronic pain from the neuromuscular disease On multiple medicines These were reviewed with the patient and her husband today They relate compliance Her pain medicine does help take the edge off her pain This patient was seen today for chronic pain  The medication list was reviewed and updated.   -Compliance with medication: She relates compliance  - Number patient states they take daily: 6 daily  -when was the last dose patient took?  Near lunchtime  The patient was advised the importance of maintaining medication and not using illegal substances with these.  Here for refills and follow up  The patient was educated that we can provide 3 monthly scripts for their medication, it is their responsibility to follow the instructions.  Side effects or complications from medications: Denies side effects with medication  Patient is aware that pain medications are meant to minimize the severity of the pain to allow their pain levels to improve to allow for better function. They are aware of that pain medications cannot totally remove their pain.  Due for UDT ( at least once per year) : We will do on next visit  Scale of 1 to 10 ( 1 is least 10 is most) Your pain level without the medicine: 7 Your pain level with medication 3  Scale 1 to 10 ( 1-helps very little, 10 helps very well) How well does your pain medication reduce your pain so you can function better through out the day?  This allows her to get through her day without severe discomfort family denies her feeling drugged    Family also relates that over the past few days she has had strong urine with bad odor to it.  Patient unable to give a urine specimen.    Review of Systems Please see above    Objective:   Physical Exam  Patient is  bedbound Dry skin on the lower legs Slight edema on the lower legs Lungs clear no crackles heart regular Pulses normal extremities has neuromuscular disease      Assessment & Plan:  The patient was seen in followup for chronic pain. A review over at their current pain status was discussed. Drug registry was checked. Prescriptions were given.  Regular follow-up recommended. Discussion was held regarding the importance of compliance with medication as well as pain medication contract.  Patient was informed that medication may cause drowsiness and should not be combined  with other medications/alcohol or street drugs. If the patient feels medication is causing altered alertness then do not drive or operate dangerous equipment.  Probable UTI we will go ahead with antibiotics for 3 days Flu shot given today with patient's consent in the right arm without difficulty Follow-up 3 months

## 2020-07-14 NOTE — Addendum Note (Signed)
Addended by: Carmelina Noun on: 07/14/2020 11:10 AM   Modules accepted: Orders

## 2020-07-15 ENCOUNTER — Other Ambulatory Visit: Payer: Self-pay | Admitting: Family Medicine

## 2020-08-24 ENCOUNTER — Telehealth: Payer: Self-pay | Admitting: *Deleted

## 2020-08-24 NOTE — Telephone Encounter (Signed)
Dr. Nicki Reaper received a letter from [facility] stating patients insurance will no longer be covering eliquis and are recommending changing to warfarin or xarelto or patient may be responsible for paying the full cost out of pocket or may need PA after January. Husband aware the medication could even be denied with the PA. He states he knows a Doctor, hospital of the drug and has been able to get a discount on It so he would like to wait until January and see what happens.

## 2020-08-26 ENCOUNTER — Inpatient Hospital Stay (HOSPITAL_COMMUNITY)
Admission: EM | Admit: 2020-08-26 | Discharge: 2020-08-29 | DRG: 872 | Disposition: A | Discharge status: Home / Self Care | Payer: BC Managed Care – PPO | Attending: Family Medicine | Admitting: Family Medicine

## 2020-08-26 ENCOUNTER — Emergency Department (HOSPITAL_COMMUNITY): Payer: BC Managed Care – PPO

## 2020-08-26 ENCOUNTER — Other Ambulatory Visit: Payer: Self-pay

## 2020-08-26 ENCOUNTER — Encounter (HOSPITAL_COMMUNITY): Payer: Self-pay

## 2020-08-26 DIAGNOSIS — J9811 Atelectasis: Secondary | ICD-10-CM | POA: Diagnosis not present

## 2020-08-26 DIAGNOSIS — G35 Multiple sclerosis: Secondary | ICD-10-CM | POA: Diagnosis present

## 2020-08-26 DIAGNOSIS — E860 Dehydration: Secondary | ICD-10-CM

## 2020-08-26 DIAGNOSIS — R509 Fever, unspecified: Secondary | ICD-10-CM | POA: Diagnosis not present

## 2020-08-26 DIAGNOSIS — Z7901 Long term (current) use of anticoagulants: Secondary | ICD-10-CM

## 2020-08-26 DIAGNOSIS — E1165 Type 2 diabetes mellitus with hyperglycemia: Secondary | ICD-10-CM | POA: Diagnosis present

## 2020-08-26 DIAGNOSIS — R6521 Severe sepsis with septic shock: Secondary | ICD-10-CM | POA: Diagnosis not present

## 2020-08-26 DIAGNOSIS — I10 Essential (primary) hypertension: Secondary | ICD-10-CM | POA: Diagnosis present

## 2020-08-26 DIAGNOSIS — E876 Hypokalemia: Secondary | ICD-10-CM

## 2020-08-26 DIAGNOSIS — D519 Vitamin B12 deficiency anemia, unspecified: Secondary | ICD-10-CM | POA: Diagnosis present

## 2020-08-26 DIAGNOSIS — L03818 Cellulitis of other sites: Secondary | ICD-10-CM | POA: Diagnosis not present

## 2020-08-26 DIAGNOSIS — R0600 Dyspnea, unspecified: Secondary | ICD-10-CM | POA: Diagnosis not present

## 2020-08-26 DIAGNOSIS — F32A Depression, unspecified: Secondary | ICD-10-CM | POA: Diagnosis present

## 2020-08-26 DIAGNOSIS — Z880 Allergy status to penicillin: Secondary | ICD-10-CM

## 2020-08-26 DIAGNOSIS — Z833 Family history of diabetes mellitus: Secondary | ICD-10-CM

## 2020-08-26 DIAGNOSIS — Z888 Allergy status to other drugs, medicaments and biological substances status: Secondary | ICD-10-CM

## 2020-08-26 DIAGNOSIS — Z6841 Body Mass Index (BMI) 40.0 and over, adult: Secondary | ICD-10-CM

## 2020-08-26 DIAGNOSIS — R5381 Other malaise: Secondary | ICD-10-CM | POA: Diagnosis not present

## 2020-08-26 DIAGNOSIS — K219 Gastro-esophageal reflux disease without esophagitis: Secondary | ICD-10-CM

## 2020-08-26 DIAGNOSIS — Z7401 Bed confinement status: Secondary | ICD-10-CM | POA: Diagnosis not present

## 2020-08-26 DIAGNOSIS — R6520 Severe sepsis without septic shock: Secondary | ICD-10-CM | POA: Diagnosis not present

## 2020-08-26 DIAGNOSIS — L03116 Cellulitis of left lower limb: Secondary | ICD-10-CM | POA: Diagnosis not present

## 2020-08-26 DIAGNOSIS — G894 Chronic pain syndrome: Secondary | ICD-10-CM | POA: Diagnosis not present

## 2020-08-26 DIAGNOSIS — Z91040 Latex allergy status: Secondary | ICD-10-CM | POA: Diagnosis not present

## 2020-08-26 DIAGNOSIS — Z9842 Cataract extraction status, left eye: Secondary | ICD-10-CM

## 2020-08-26 DIAGNOSIS — M2550 Pain in unspecified joint: Secondary | ICD-10-CM | POA: Diagnosis not present

## 2020-08-26 DIAGNOSIS — L0390 Cellulitis, unspecified: Secondary | ICD-10-CM

## 2020-08-26 DIAGNOSIS — E872 Acidosis, unspecified: Secondary | ICD-10-CM

## 2020-08-26 DIAGNOSIS — Z981 Arthrodesis status: Secondary | ICD-10-CM

## 2020-08-26 DIAGNOSIS — D6851 Activated protein C resistance: Secondary | ICD-10-CM | POA: Diagnosis not present

## 2020-08-26 DIAGNOSIS — Z79899 Other long term (current) drug therapy: Secondary | ICD-10-CM

## 2020-08-26 DIAGNOSIS — J80 Acute respiratory distress syndrome: Secondary | ICD-10-CM | POA: Diagnosis not present

## 2020-08-26 DIAGNOSIS — M797 Fibromyalgia: Secondary | ICD-10-CM | POA: Diagnosis not present

## 2020-08-26 DIAGNOSIS — R404 Transient alteration of awareness: Secondary | ICD-10-CM | POA: Diagnosis not present

## 2020-08-26 DIAGNOSIS — Z961 Presence of intraocular lens: Secondary | ICD-10-CM | POA: Diagnosis present

## 2020-08-26 DIAGNOSIS — A419 Sepsis, unspecified organism: Principal | ICD-10-CM | POA: Diagnosis present

## 2020-08-26 DIAGNOSIS — Z20822 Contact with and (suspected) exposure to covid-19: Secondary | ICD-10-CM | POA: Diagnosis present

## 2020-08-26 DIAGNOSIS — Z83438 Family history of other disorder of lipoprotein metabolism and other lipidemia: Secondary | ICD-10-CM

## 2020-08-26 DIAGNOSIS — R739 Hyperglycemia, unspecified: Secondary | ICD-10-CM | POA: Diagnosis not present

## 2020-08-26 DIAGNOSIS — R410 Disorientation, unspecified: Secondary | ICD-10-CM | POA: Diagnosis not present

## 2020-08-26 DIAGNOSIS — L03115 Cellulitis of right lower limb: Secondary | ICD-10-CM | POA: Diagnosis present

## 2020-08-26 DIAGNOSIS — Z8249 Family history of ischemic heart disease and other diseases of the circulatory system: Secondary | ICD-10-CM

## 2020-08-26 DIAGNOSIS — R0902 Hypoxemia: Secondary | ICD-10-CM

## 2020-08-26 DIAGNOSIS — Z87891 Personal history of nicotine dependence: Secondary | ICD-10-CM

## 2020-08-26 DIAGNOSIS — Z9841 Cataract extraction status, right eye: Secondary | ICD-10-CM

## 2020-08-26 DIAGNOSIS — R7303 Prediabetes: Secondary | ICD-10-CM | POA: Diagnosis not present

## 2020-08-26 DIAGNOSIS — J449 Chronic obstructive pulmonary disease, unspecified: Secondary | ICD-10-CM | POA: Diagnosis present

## 2020-08-26 DIAGNOSIS — F419 Anxiety disorder, unspecified: Secondary | ICD-10-CM | POA: Diagnosis present

## 2020-08-26 LAB — PROTIME-INR
INR: 1.4 — ABNORMAL HIGH (ref 0.8–1.2)
Prothrombin Time: 16.2 seconds — ABNORMAL HIGH (ref 11.4–15.2)

## 2020-08-26 LAB — CBC WITH DIFFERENTIAL/PLATELET
Abs Immature Granulocytes: 0.04 10*3/uL (ref 0.00–0.07)
Basophils Absolute: 0 10*3/uL (ref 0.0–0.1)
Basophils Relative: 1 %
Eosinophils Absolute: 0 10*3/uL (ref 0.0–0.5)
Eosinophils Relative: 0 %
HCT: 48.5 % — ABNORMAL HIGH (ref 36.0–46.0)
Hemoglobin: 15.7 g/dL — ABNORMAL HIGH (ref 12.0–15.0)
Immature Granulocytes: 1 %
Lymphocytes Relative: 7 %
Lymphs Abs: 0.6 10*3/uL — ABNORMAL LOW (ref 0.7–4.0)
MCH: 34.4 pg — ABNORMAL HIGH (ref 26.0–34.0)
MCHC: 32.4 g/dL (ref 30.0–36.0)
MCV: 106.1 fL — ABNORMAL HIGH (ref 80.0–100.0)
Monocytes Absolute: 0.4 10*3/uL (ref 0.1–1.0)
Monocytes Relative: 4 %
Neutro Abs: 7.3 10*3/uL (ref 1.7–7.7)
Neutrophils Relative %: 87 %
Platelets: 178 10*3/uL (ref 150–400)
RBC: 4.57 MIL/uL (ref 3.87–5.11)
RDW: 11.8 % (ref 11.5–15.5)
WBC: 8.4 10*3/uL (ref 4.0–10.5)
nRBC: 0 % (ref 0.0–0.2)

## 2020-08-26 LAB — COMPREHENSIVE METABOLIC PANEL
ALT: 12 U/L (ref 0–44)
AST: 17 U/L (ref 15–41)
Albumin: 3.7 g/dL (ref 3.5–5.0)
Alkaline Phosphatase: 82 U/L (ref 38–126)
Anion gap: 11 (ref 5–15)
BUN: 10 mg/dL (ref 6–20)
CO2: 26 mmol/L (ref 22–32)
Calcium: 8.4 mg/dL — ABNORMAL LOW (ref 8.9–10.3)
Chloride: 100 mmol/L (ref 98–111)
Creatinine, Ser: 0.79 mg/dL (ref 0.44–1.00)
GFR, Estimated: >60 mL/min (ref >60)
Glucose, Bld: 257 mg/dL — ABNORMAL HIGH (ref 70–99)
Potassium: 3.4 mmol/L — ABNORMAL LOW (ref 3.5–5.1)
Sodium: 137 mmol/L (ref 135–145)
Total Bilirubin: 0.4 mg/dL (ref 0.3–1.2)
Total Protein: 7.5 g/dL (ref 6.5–8.1)

## 2020-08-26 LAB — URINALYSIS, ROUTINE W REFLEX MICROSCOPIC
Bilirubin Urine: NEGATIVE
Glucose, UA: >=500 mg/dL — AB
Hgb urine dipstick: NEGATIVE
Ketones, ur: NEGATIVE mg/dL
Leukocytes,Ua: NEGATIVE
Nitrite: NEGATIVE
Protein, ur: NEGATIVE mg/dL
Specific Gravity, Urine: 1.024 (ref 1.005–1.030)
pH: 5 (ref 5.0–8.0)

## 2020-08-26 LAB — RESP PANEL BY RT-PCR (FLU A&B, COVID) ARPGX2
Influenza A by PCR: NEGATIVE
Influenza B by PCR: NEGATIVE
SARS Coronavirus 2 by RT PCR: NEGATIVE

## 2020-08-26 LAB — APTT: aPTT: 34 seconds (ref 24–36)

## 2020-08-26 LAB — LACTIC ACID, PLASMA: Lactic Acid, Venous: 3.9 mmol/L (ref 0.5–1.9)

## 2020-08-26 MED ORDER — LACTATED RINGERS IV SOLN: INTRAVENOUS | Status: AC

## 2020-08-26 MED ORDER — VANCOMYCIN HCL 1500 MG/300ML IV SOLN
1500.0000 mg | Freq: Once | INTRAVENOUS | Status: AC
  Administered 2020-08-26: 1500 mg via INTRAVENOUS
  Filled 2020-08-26: qty 300

## 2020-08-26 MED ORDER — ACETAMINOPHEN 650 MG RE SUPP
650.0000 mg | Freq: Once | RECTAL | Status: AC
  Administered 2020-08-26: 650 mg via RECTAL

## 2020-08-26 MED ORDER — LACTATED RINGERS IV BOLUS (SEPSIS)
1000.0000 mL | Freq: Once | INTRAVENOUS | Status: AC
  Administered 2020-08-27: 1000 mL via INTRAVENOUS

## 2020-08-26 MED ORDER — IOHEXOL 300 MG/ML  SOLN
75.0000 mL | Freq: Once | INTRAMUSCULAR | Status: AC | PRN
  Administered 2020-08-26: 75 mL via INTRAVENOUS

## 2020-08-26 MED ORDER — LACTATED RINGERS IV BOLUS (SEPSIS)
1000.0000 mL | Freq: Once | INTRAVENOUS | Status: AC
  Administered 2020-08-26: 1000 mL via INTRAVENOUS

## 2020-08-26 MED ORDER — METRONIDAZOLE IN NACL 5-0.79 MG/ML-% IV SOLN
500.0000 mg | Freq: Once | INTRAVENOUS | Status: AC
  Administered 2020-08-26: 500 mg via INTRAVENOUS
  Filled 2020-08-26: qty 100

## 2020-08-26 MED ORDER — VANCOMYCIN HCL IN DEXTROSE 1-5 GM/200ML-% IV SOLN: 1000.0000 mg | Freq: Once | INTRAVENOUS | Status: DC

## 2020-08-26 MED ORDER — SODIUM CHLORIDE 0.9 % IV SOLN
2.0000 g | Freq: Once | INTRAVENOUS | Status: AC
  Administered 2020-08-26: 2 g via INTRAVENOUS
  Filled 2020-08-26: qty 2

## 2020-08-26 NOTE — ED Notes (Signed)
Ice packs applied to pt for fever reduction

## 2020-08-26 NOTE — ED Notes (Signed)
Date and time results received: 08/26/20 10:31 PM(use smartphrase ".now" to insert current time)  Test: lactic acid Critical Value: 3.9  Name of Provider Notified: Dr Dina Rich  Orders Received? Or Actions Taken?: see chart

## 2020-08-26 NOTE — ED Provider Notes (Signed)
11:44 PM Assumed care from Dr. Dina Rich, please see their note for full history, physical and decision making until this point. In brief this is a 54 y.o. year old female who presented to the ED tonight with Shortness of Breath     At this time is undifferentiated fever. Already treated for sepsis. Pending CTA. On oxygen (has dyspnea for awhile). Will need admitted.   Ct ok. On my eval, appears to have possible cellulitis as cause for sepsis. Either way has been covered. MS improved, doubt meningitis at this time, d/w hospitalist for admission.    Labs, studies and imaging reviewed by myself and considered in medical decision making if ordered. Imaging interpreted by radiology.  Labs Reviewed  LACTIC ACID, PLASMA - Abnormal; Notable for the following components:      Result Value   Lactic Acid, Venous 3.9 (*)    All other components within normal limits  COMPREHENSIVE METABOLIC PANEL - Abnormal; Notable for the following components:   Potassium 3.4 (*)    Glucose, Bld 257 (*)    Calcium 8.4 (*)    All other components within normal limits  CBC WITH DIFFERENTIAL/PLATELET - Abnormal; Notable for the following components:   Hemoglobin 15.7 (*)    HCT 48.5 (*)    MCV 106.1 (*)    MCH 34.4 (*)    Lymphs Abs 0.6 (*)    All other components within normal limits  URINALYSIS, ROUTINE W REFLEX MICROSCOPIC - Abnormal; Notable for the following components:   APPearance HAZY (*)    Glucose, UA >=500 (*)    Bacteria, UA RARE (*)    All other components within normal limits  PROTIME-INR - Abnormal; Notable for the following components:   Prothrombin Time 16.2 (*)    INR 1.4 (*)    All other components within normal limits  RESP PANEL BY RT-PCR (FLU A&B, COVID) ARPGX2  CULTURE, BLOOD (ROUTINE X 2)  CULTURE, BLOOD (ROUTINE X 2)  URINE CULTURE  APTT  LACTIC ACID, PLASMA    DG Chest Port 1 View  Final Result    CT Chest W Contrast    (Results Pending)    No follow-ups on file.     Merrily Pew, MD 08/28/20 [phone]

## 2020-08-26 NOTE — ED Provider Notes (Signed)
[facility] EMERGENCY DEPARTMENT Provider Note   CSN: [id number] Arrival date & time: 08/26/20  2114     History Chief Complaint  Patient presents with  . Shortness of Breath    [name] is a 54 y.o. female.  HPI     This is a 54 year old female with multiple medical problems including multiple sclerosis, COPD, chronic pain syndrome, factor V Leiden, hypertension, UTI who presents with concern for shortness of breath.  Per EMS, they were called out for shortness of breath.  She was noted to be hypoxic at 85% on room air.  She was placed on 3 L.  Patient is reportedly bedridden.  She is unable to really provide me much history.  When I asked why she is here she cannot tell me.  When asked if she is short of breath she states "yes."  When asked if she has a fever, abdominal pain, nausea, vomiting, she simply answers yes and does not elaborate.  Level 5 caveat.  Past Medical History:  Diagnosis Date  . Anxiety   . Arthritis    "hands & feet" (02/11/2013)  . Ataxia   . B12 deficiency anemia    [name] 02/11/2013  . Borderline diabetes   . Chronic bronchitis ([place])    "yearly" (02/11/2013)  . Chronic edema    BLE/notes 02/11/2013  . Chronic low back pain   . Chronic pain syndrome    [name] 02/11/2013  . COPD (chronic obstructive pulmonary disease) ([place])   . Depression   . Eating disorder   . Factor V Leiden mutation, heterozygous ([place]) 01/08/2017  . Fibromyalgia   . GERD (gastroesophageal reflux disease)   . 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 Hypertension   . Impaired fasting glucose   . Migraine    "I use to" (02/11/2013)  . MS (multiple sclerosis) ([place])   . Muscle weakness of lower extremity    bilateral  . Peripheral neuropathy    [name] 02/11/2013  . Peripheral neuropathy   . PONV (postoperative nausea and vomiting)   . UTI (lower urinary tract infection) 02/11/2013   [name] 02/11/2013    Patient Active Problem List   Diagnosis Date  Noted  . Factor V Leiden mutation, heterozygous ([place]) 01/08/2017  . Major depression 03/31/2016  . Nocturnal hypoxemia 06/17/2015  . Primary hypercoagulable state ([place]) 03/19/2015  . Chronic respiratory failure ([place]) 03/19/2015  . Insomnia 02/02/2015  . Encounter for chronic pain management   . Perforated gastric ulcer ([place]) 09/27/2014  . Prediabetes 07/23/2014  . Hereditary and idiopathic peripheral neuropathy 04/15/2014  . History of CVA (cerebrovascular accident) 04/02/2014  . Hypertriglyceridemia 12/11/2013  . Stiffness of joint, not elsewhere classified, ankle and foot 11/12/2013  . Lack of coordination 11/03/2013  . Pernicious anemia 09/12/2013  . Elevated transaminase level 09/12/2013  . Ataxia 09/09/2013  . Difficulty walking 09/09/2013  . Weakness of both legs 09/09/2013  . Generalized weakness 02/11/2013  . Tobacco abuse 01/14/2013  . Pedal edema 01/13/2013  . Chronic pain syndrome 12/30/2012  . Reactive airway disease 12/30/2012    Past Surgical History:  Procedure Laterality Date  . ABDOMINAL HYSTERECTOMY  ~ 1987; ~ 2004   "[place]; [place]" (02/11/2013)  . ANTERIOR CERVICAL DECOMP/DISCECTOMY FUSION  2009; 2011  . APPENDECTOMY    . CATARACT EXTRACTION W/PHACO  06/20/2011   Procedure: CATARACT EXTRACTION PHACO AND INTRAOCULAR LENS PLACEMENT (IOC);  Surgeon: [name] T. Gershon Crane;  Location: [facility];  Service: Ophthalmology;  Laterality: Left;  CDE 1.81  . CATARACT EXTRACTION W/PHACO  07/04/2011   Procedure: CATARACT EXTRACTION PHACO AND INTRAOCULAR LENS PLACEMENT (IOC);  Surgeon: [name] T. Gershon Crane;  Location: [facility];  Service: Ophthalmology;  Laterality: Right;  CDE: 1.76  . CESAREAN SECTION  1982; 1984; 1986  . EMBOLECTOMY Left 09/27/2014   Procedure: Left Brachial, Radial,Ulnar Embolectomy with patch angioplasty left brachial, radial and ulnar artery;  Surgeon: [name], MD;  Location: [facility];  Service: Vascular;  Laterality: Left;  . EMBOLECTOMY Left 09/27/2014    Procedure: Left Radial, Brachial, and Ulnar Thrombectomy; Left Brachial to Radial Bypass Graft using Greater Saphenous vein graft from Left Thigh; Left Saphenous Vein Harvest; Intraoperative Arteriogram; Intra-arterial administration of TPA;  Surgeon: [name], MD;  Location: [facility];  Service: Vascular;  Laterality: Left;  . LAPAROTOMY N/A 09/27/2014   Procedure: Exploratory Laparotomy, Biopsy of Perforated Gastric Ulcer, Closure with omental patch;  Surgeon: [name], MD;  Location: [facility];  Service: General;  Laterality: N/A;  . TONSILLECTOMY  1990's?  . TRACHEOSTOMY N/A december 2015  . YAG LASER APPLICATION Right 2/77/4128   Procedure: YAG LASER APPLICATION;  Surgeon: [name] T. Gershon Crane, MD;  Location: [facility];  Service: Ophthalmology;  Laterality: Right;  . YAG LASER APPLICATION Left 7/86/7672   Procedure: YAG LASER APPLICATION;  Surgeon: [name] T. Gershon Crane, MD;  Location: [facility];  Service: Ophthalmology;  Laterality: Left;     OB History   No obstetric history on file.     Family History  Problem Relation Age of Onset  . Hyperlipidemia Mother   . Heart disease Mother   . Cancer Mother        lung  . Diabetes Father   . Heart disease Father   . Cancer Maternal Grandmother        breast  . Anesthesia problems Neg Hx   . Hypotension Neg Hx   . Malignant hyperthermia Neg Hx   . Pseudochol deficiency Neg Hx     Social History   Tobacco Use  . Smoking status: Former Smoker    Packs/day: 1.00    Years: 33.00    Pack years: 33.00    Types: Cigarettes  . Smokeless tobacco: Former Systems developer    Quit date: 09/08/2014  Vaping Use  . Vaping Use: Never used  Substance Use Topics  . Alcohol use: No    Alcohol/week: 0.0 standard drinks  . Drug use: No    Home Medications Prior to Admission medications   Medication Sig Start Date End Date Taking? Authorizing Provider  ACCU-CHEK FASTCLIX LANCETS MISC USE TO TEST ONCE DAILY. 09/18/16   Kathyrn Drown, MD  Calcium Carbonate  (CALCIUM 600 PO) Take by mouth. 2 daily    [provider]  clobetasol (TEMOVATE) 0.05 % external solution Apply 1 application topically 2 (two) times daily. 04/26/20   [name], Elayne Snare, MD  ELIQUIS 5 MG TABS tablet TAKE (1) TABLET BY MOUTH TWICE DAILY. 03/26/20   Kathyrn Drown, MD  gabapentin (NEURONTIN) 100 MG capsule Take 3 capsules (300 mg total) by mouth 3 (three) times daily. Tapering per written routine guidelines Patient not taking: Reported on 06/07/2020 04/19/20   Kathyrn Drown, MD  gabapentin (NEURONTIN) 100 MG capsule Take one capsule po twice daily for one week, then one capsule po for one week, then stop. Patient not taking: Reported on 06/07/2020 04/19/20   Kathyrn Drown, MD  gabapentin (NEURONTIN) 300 MG capsule Take 1 capsule (300 mg total)  by mouth 3 (three) times daily. 05/05/20   Kathyrn Drown, MD  glucose blood (ONE TOUCH ULTRA TEST) test strip USE TO TEST ONCE DAILY. 07/05/18   Kathyrn Drown, MD  hydrocortisone 2.5 % cream Apply topically 2 (two) times daily as needed. For up to 2 weeks 04/02/20   Kathyrn Drown, MD  naloxone [facility]) nasal spray 4 mg/0.1 mL Use as directed 06/08/20   Kathyrn Drown, MD  nitrofurantoin, macrocrystal-monohydrate, (MACROBID) 100 MG capsule Take 1 capsule (100 mg total) by mouth 2 (two) times daily. 07/12/20   Kathyrn Drown, MD  oxyCODONE-acetaminophen (PERCOCET/ROXICET) 5-325 MG tablet 1 q4 hours prn pain max 6 per day 07/12/20   Kathyrn Drown, MD  oxyCODONE-acetaminophen (PERCOCET/ROXICET) 5-325 MG tablet Take one tablet every 4 hours prn pain 07/12/20   Kathyrn Drown, MD  oxyCODONE-acetaminophen (PERCOCET/ROXICET) 5-325 MG tablet 1 every 4 hours as needed pain maximum 6/day 07/12/20   [name], Elayne Snare, MD  pantoprazole (PROTONIX) 40 MG tablet TAKE 1 TABLET BY MOUTH ONCE DAILY. 03/26/20   Kathyrn Drown, MD  pimecrolimus (ELIDEL) 1 % cream Apply thin amount twice daily to the affected areas for up to the next 2 weeks.  Use moisturizing  lotion in the evening.  Or Vaseline.  04/05/20   Kathyrn Drown, MD  potassium chloride (KLOR-CON) 10 MEQ tablet TAKE 2 TABLETS BY MOUTH DAILY. 06/18/20   Kathyrn Drown, MD  PROAIR HFA 108 [phone] Base) MCG/ACT inhaler INHALE 2 PUFFS EVERY 6 HOURS AS NEEDED. 11/22/19   Kathyrn Drown, MD  senna (SENOKOT) 8.6 MG tablet Take 1 tablet (8.6 mg total) by mouth 2 (two) times daily. 12/10/14   Kathyrn Drown, MD  sucralfate (CARAFATE) 1 g tablet 1 pill tid 01/31/19   Kathyrn Drown, MD  Thiamine HCl (VITAMIN B-1 PO) Take by mouth daily.    [provider]  traZODone (DESYREL) 100 MG tablet TAKE 1 AND A HALF TABLET BY MOUTH AT BEDTIME. 07/16/20   Kathyrn Drown, MD  vitamin B-12 (CYANOCOBALAMIN) 1000 MCG tablet Take 1,000 mcg by mouth daily.    [provider]    Allergies    Ambien [zolpidem tartrate], Ceftin [cefuroxime axetil], Hctz [hydrochlorothiazide], Lodine [etodolac], Promethazine, Codeine, Latex, and Penicillins  Review of Systems   Review of Systems  Unable to perform ROS: Acuity of condition  Constitutional: Positive for fever.  Respiratory: Positive for shortness of breath. Negative for cough.   All other systems reviewed and are negative.   Physical Exam Updated Vital Signs BP 123/80   Pulse (!) 119   Temp (!) 104.4 F (40.2 C) (Rectal)   Resp 20   Ht 1.448 m (4\' 9" )   Wt 86.2 kg   SpO2 99%   BMI 41.12 kg/m   Physical Exam Vitals and nursing note reviewed.  Constitutional:      General: She is in acute distress.     Appearance: She is well-developed.     Comments: Chronically ill-appearing  HENT:     Head: Normocephalic and atraumatic.     Mouth/Throat:     Mouth: Mucous membranes are dry.  Eyes:     Pupils: Pupils are equal, round, and reactive to light.  Cardiovascular:     Rate and Rhythm: Regular rhythm. Tachycardia present.     Heart sounds: Normal heart sounds.  Pulmonary:     Effort: Pulmonary effort is normal. No respiratory distress.      Breath  sounds: No wheezing.     Comments: Coarse breath sounds in all lung fields Abdominal:     General: Bowel sounds are normal.     Palpations: Abdomen is soft.     Tenderness: There is no abdominal tenderness.     Comments: Extensive abdominal scarring, soft, nontender  Musculoskeletal:     Cervical back: Neck supple.     Comments: Trace bilateral lower extremity edema  Skin:    General: Skin is warm and dry.  Neurological:     Mental Status: She is alert.     Comments: Oriented to person and place but not time  Psychiatric:     Comments: Unable to assess     ED Results / Procedures / Treatments   Labs (all labs ordered are listed, but only abnormal results are displayed) Labs Reviewed  LACTIC ACID, PLASMA - Abnormal; Notable for the following components:      Result Value   Lactic Acid, Venous 3.9 (*)    All other components within normal limits  COMPREHENSIVE METABOLIC PANEL - Abnormal; Notable for the following components:   Potassium 3.4 (*)    Glucose, Bld 257 (*)    Calcium 8.4 (*)    All other components within normal limits  CBC WITH DIFFERENTIAL/PLATELET - Abnormal; Notable for the following components:   Hemoglobin 15.7 (*)    HCT 48.5 (*)    MCV 106.1 (*)    MCH 34.4 (*)    Lymphs Abs 0.6 (*)    All other components within normal limits  URINALYSIS, ROUTINE W REFLEX MICROSCOPIC - Abnormal; Notable for the following components:   APPearance HAZY (*)    Glucose, UA >=500 (*)    Bacteria, UA RARE (*)    All other components within normal limits  PROTIME-INR - Abnormal; Notable for the following components:   Prothrombin Time 16.2 (*)    INR 1.4 (*)    All other components within normal limits  RESP PANEL BY RT-PCR (FLU A&B, COVID) ARPGX2  CULTURE, BLOOD (ROUTINE X 2)  CULTURE, BLOOD (ROUTINE X 2)  URINE CULTURE  APTT  LACTIC ACID, PLASMA    EKG EKG Interpretation  Date/Time:  Thursday August 26 2020 21:22:47 EST Ventricular Rate:  118 PR  Interval:    QRS Duration: 86 QT Interval:  312 QTC Calculation: 438 R Axis:   -19 Text Interpretation: Sinus tachycardia Atrial premature complexes Borderline left axis deviation Low voltage, precordial leads Consider anterior infarct Confirmed by Thayer Jew ([id number]) on 08/26/2020 10:33:21 PM   Radiology DG Chest Port 1 View  Result Date: 08/26/2020 CLINICAL DATA:  Questionable sepsis EXAM: PORTABLE CHEST 1 VIEW COMPARISON:  10/18/2014 FINDINGS: Heart is borderline in size. Left base opacity could reflect atelectasis or scarring. Right lung clear. No effusions. IMPRESSION: Chronic left basilar atelectasis or scarring. No active disease. Electronically Signed   By: Rolm Baptise M.D.   On: 08/26/2020 22:26    Procedures Procedures (including critical care time)  CRITICAL CARE Performed by: Merryl Hacker   Total critical care time: 60 minutes  Critical care time was exclusive of separately billable procedures and treating other patients.  Critical care was necessary to treat or prevent imminent or life-threatening deterioration.  Critical care was time spent personally by me on the following activities: development of treatment plan with patient and/or surrogate as well as nursing, discussions with consultants, evaluation of patient's response to treatment, examination of patient, obtaining history from patient or surrogate, ordering and performing  treatments and interventions, ordering and review of laboratory studies, ordering and review of radiographic studies, pulse oximetry and re-evaluation of patient's condition.   Medications Ordered in ED Medications  lactated ringers infusion ( Intravenous New Bag/Given 08/26/20 2227)  lactated ringers bolus 1,000 mL (has no administration in time range)    And  lactated ringers bolus 1,000 mL (1,000 mLs Intravenous New Bag/Given 08/26/20 2224)    And  lactated ringers bolus 1,000 mL (1,000 mLs Intravenous New Bag/Given 08/26/20  2224)  metroNIDAZOLE (FLAGYL) IVPB 500 mg (500 mg Intravenous New Bag/Given 08/26/20 2228)  vancomycin (VANCOREADY) IVPB 1500 mg/300 mL (1,500 mg Intravenous New Bag/Given 08/26/20 2229)  acetaminophen (TYLENOL) suppository 650 mg (650 mg Rectal Given 08/26/20 2139)  ceFEPIme (MAXIPIME) 2 g in sodium chloride 0.9 % 100 mL IVPB (2 g Intravenous New Bag/Given 08/26/20 2226)    ED Course  I have reviewed the triage vital signs and the nursing notes.  Pertinent labs & imaging results that were available during my care of the patient were reviewed by me and considered in my medical decision making (see chart for details).  Clinical Course as of Aug 26 2322  Thu Aug 26, 2020  2316 On recheck, patient is more awake and alert.  States that she feels somewhat better.  Suspect her temperature is downtrending improving her mental status.  She has no specific complaint at this time.  Lactic acid noted to be 3.9.  She is receiving appropriate resuscitation.  Otherwise she has some mild hyperglycemia.  No significant leukocytosis.  Chest x-ray without obvious pneumonia or other infectious etiology.   [CH]  2322 Patient is able to tell me that she has been short of breath since yesterday.  No known Covid exposures or sick contacts.  She is fully vaccinated.   [CH]    Clinical Course User Index [CH] Ameliyah Sarno, Barbette Hair, MD   MDM Rules/Calculators/A&P                          Patient presents with reported shortness of breath.  Noted to be hypoxic on EMS arrival.  She is febrile to 105.3.  She was given Tylenol.  Initially fairly altered and difficult to obtain history from.  She has a significant medical history and is mostly bedbound.  No noted sores.  She is in no acute respiratory distress but is on a nasal cannula.  She has extensive abdominal scarring but no tenderness.  Sepsis work-up initiated.  She was given 30 cc/kg of lactated Ringer's and broad-spectrum antibiotics including vancomycin, Flagyl,  and cefepime given unknown source.  See clinical course above.  Labs reviewed.  Notable lab work for hyperglycemia without anion gap, lactic acidosis to 3.9.  No significant leukocytosis.  No obvious pneumonia or pulmonary process on x-ray.  Requested in and out cath and awaiting urinalysis.  Suspect she may have a UTI.  Covid testing is negative.  11:23 PM Urinalysis negative.  Mental status is markedly improved.  Only complaint of shortness of breath since yesterday.  She is fully vaccinated against COVID-19 and Covid testing is negative.  Chest x-ray does not show any obvious pneumonia or infectious source although given fever, would highly suspect pulmonary source with her symptoms.  Will obtain a CT with contrast.  Suspect she may need admission given her clinical presentation.  Will sign out to oncoming provider.  Final Clinical Impression(s) / ED Diagnoses Final diagnoses:  Severe sepsis ([place])  Rx / DC Orders ED Discharge Orders    None       Uyen Eichholz, Barbette Hair, MD 08/26/20 2324

## 2020-08-26 NOTE — ED Triage Notes (Signed)
[facility] called out for shortness of breath. Upon arrival pt was 85% on room air, placed on 3L oxygen with sats increasing to 93%. Pt temporal temp with EMS was 104.4.  EMS reports pt and husband are poor historians. Husband reports pt is bedridden since she had a 4 month admission in ICU, pt left hand is also constricted.

## 2020-08-27 DIAGNOSIS — E860 Dehydration: Secondary | ICD-10-CM

## 2020-08-27 DIAGNOSIS — G35 Multiple sclerosis: Secondary | ICD-10-CM | POA: Diagnosis not present

## 2020-08-27 DIAGNOSIS — Z888 Allergy status to other drugs, medicaments and biological substances status: Secondary | ICD-10-CM | POA: Diagnosis not present

## 2020-08-27 DIAGNOSIS — R6520 Severe sepsis without septic shock: Secondary | ICD-10-CM

## 2020-08-27 DIAGNOSIS — K219 Gastro-esophageal reflux disease without esophagitis: Secondary | ICD-10-CM

## 2020-08-27 DIAGNOSIS — L03818 Cellulitis of other sites: Secondary | ICD-10-CM | POA: Diagnosis not present

## 2020-08-27 DIAGNOSIS — Z880 Allergy status to penicillin: Secondary | ICD-10-CM | POA: Diagnosis not present

## 2020-08-27 DIAGNOSIS — D6851 Activated protein C resistance: Secondary | ICD-10-CM

## 2020-08-27 DIAGNOSIS — R7303 Prediabetes: Secondary | ICD-10-CM | POA: Diagnosis not present

## 2020-08-27 DIAGNOSIS — R0600 Dyspnea, unspecified: Secondary | ICD-10-CM | POA: Diagnosis not present

## 2020-08-27 DIAGNOSIS — R509 Fever, unspecified: Secondary | ICD-10-CM | POA: Diagnosis not present

## 2020-08-27 DIAGNOSIS — L03116 Cellulitis of left lower limb: Secondary | ICD-10-CM | POA: Diagnosis present

## 2020-08-27 DIAGNOSIS — E876 Hypokalemia: Secondary | ICD-10-CM

## 2020-08-27 DIAGNOSIS — A419 Sepsis, unspecified organism: Secondary | ICD-10-CM | POA: Diagnosis not present

## 2020-08-27 DIAGNOSIS — R0902 Hypoxemia: Secondary | ICD-10-CM | POA: Diagnosis present

## 2020-08-27 DIAGNOSIS — R739 Hyperglycemia, unspecified: Secondary | ICD-10-CM | POA: Diagnosis not present

## 2020-08-27 DIAGNOSIS — L0390 Cellulitis, unspecified: Secondary | ICD-10-CM

## 2020-08-27 DIAGNOSIS — M797 Fibromyalgia: Secondary | ICD-10-CM | POA: Diagnosis present

## 2020-08-27 DIAGNOSIS — Z91040 Latex allergy status: Secondary | ICD-10-CM | POA: Diagnosis not present

## 2020-08-27 DIAGNOSIS — Z7401 Bed confinement status: Secondary | ICD-10-CM | POA: Diagnosis not present

## 2020-08-27 DIAGNOSIS — L03115 Cellulitis of right lower limb: Secondary | ICD-10-CM | POA: Diagnosis present

## 2020-08-27 DIAGNOSIS — G894 Chronic pain syndrome: Secondary | ICD-10-CM | POA: Diagnosis present

## 2020-08-27 DIAGNOSIS — J9811 Atelectasis: Secondary | ICD-10-CM | POA: Diagnosis not present

## 2020-08-27 DIAGNOSIS — Z20822 Contact with and (suspected) exposure to covid-19: Secondary | ICD-10-CM | POA: Diagnosis present

## 2020-08-27 DIAGNOSIS — I10 Essential (primary) hypertension: Secondary | ICD-10-CM | POA: Diagnosis present

## 2020-08-27 DIAGNOSIS — E872 Acidosis, unspecified: Secondary | ICD-10-CM

## 2020-08-27 DIAGNOSIS — Z6841 Body Mass Index (BMI) 40.0 and over, adult: Secondary | ICD-10-CM | POA: Diagnosis not present

## 2020-08-27 DIAGNOSIS — E1165 Type 2 diabetes mellitus with hyperglycemia: Secondary | ICD-10-CM | POA: Diagnosis present

## 2020-08-27 DIAGNOSIS — J449 Chronic obstructive pulmonary disease, unspecified: Secondary | ICD-10-CM | POA: Diagnosis not present

## 2020-08-27 LAB — HIV ANTIBODY (ROUTINE TESTING W REFLEX): HIV Screen 4th Generation wRfx: NONREACTIVE

## 2020-08-27 LAB — COMPREHENSIVE METABOLIC PANEL
ALT: 12 U/L (ref 0–44)
AST: 14 U/L — ABNORMAL LOW (ref 15–41)
Albumin: 3.1 g/dL — ABNORMAL LOW (ref 3.5–5.0)
Alkaline Phosphatase: 60 U/L (ref 38–126)
Anion gap: 11 (ref 5–15)
BUN: 9 mg/dL (ref 6–20)
CO2: 28 mmol/L (ref 22–32)
Calcium: 8.4 mg/dL — ABNORMAL LOW (ref 8.9–10.3)
Chloride: 100 mmol/L (ref 98–111)
Creatinine, Ser: 0.67 mg/dL (ref 0.44–1.00)
GFR, Estimated: >60 mL/min (ref >60)
Glucose, Bld: 182 mg/dL — ABNORMAL HIGH (ref 70–99)
Potassium: 4 mmol/L (ref 3.5–5.1)
Sodium: 139 mmol/L (ref 135–145)
Total Bilirubin: 0.9 mg/dL (ref 0.3–1.2)
Total Protein: 6.2 g/dL — ABNORMAL LOW (ref 6.5–8.1)

## 2020-08-27 LAB — GLUCOSE, CAPILLARY: Glucose-Capillary: 141 mg/dL — ABNORMAL HIGH (ref 70–99)

## 2020-08-27 LAB — APTT: aPTT: 33 seconds (ref 24–36)

## 2020-08-27 LAB — CBC
HCT: 47.2 % — ABNORMAL HIGH (ref 36.0–46.0)
Hemoglobin: 15 g/dL (ref 12.0–15.0)
MCH: 34 pg (ref 26.0–34.0)
MCHC: 31.8 g/dL (ref 30.0–36.0)
MCV: 107 fL — ABNORMAL HIGH (ref 80.0–100.0)
Platelets: 146 10*3/uL — ABNORMAL LOW (ref 150–400)
RBC: 4.41 MIL/uL (ref 3.87–5.11)
RDW: 11.6 % (ref 11.5–15.5)
WBC: 11.2 10*3/uL — ABNORMAL HIGH (ref 4.0–10.5)
nRBC: 0 % (ref 0.0–0.2)

## 2020-08-27 LAB — HEMOGLOBIN A1C
Hgb A1c MFr Bld: 6 % — ABNORMAL HIGH (ref 4.8–5.6)
Mean Plasma Glucose: 125.5 mg/dL

## 2020-08-27 LAB — PROTIME-INR
INR: 1.3 — ABNORMAL HIGH (ref 0.8–1.2)
Prothrombin Time: 16 seconds — ABNORMAL HIGH (ref 11.4–15.2)

## 2020-08-27 LAB — LACTIC ACID, PLASMA
Lactic Acid, Venous: 5.3 mmol/L (ref 0.5–1.9)
Lactic Acid, Venous: 2.9 mmol/L (ref 0.5–1.9)
Lactic Acid, Venous: 3.3 mmol/L (ref 0.5–1.9)

## 2020-08-27 LAB — PHOSPHORUS: Phosphorus: 1.7 mg/dL — ABNORMAL LOW (ref 2.5–4.6)

## 2020-08-27 LAB — MAGNESIUM: Magnesium: 1.4 mg/dL — ABNORMAL LOW (ref 1.7–2.4)

## 2020-08-27 LAB — MRSA PCR SCREENING: MRSA by PCR: NEGATIVE

## 2020-08-27 MED ORDER — OXYCODONE-ACETAMINOPHEN 5-325 MG PO TABS
1.0000 | ORAL_TABLET | Freq: Four times a day (QID) | ORAL | Status: DC | PRN
  Administered 2020-08-27 – 2020-08-29 (×8): 1 via ORAL
  Filled 2020-08-27 (×9): qty 1

## 2020-08-27 MED ORDER — CHLORHEXIDINE GLUCONATE CLOTH 2 % EX PADS
6.0000 | MEDICATED_PAD | Freq: Every day | CUTANEOUS | Status: DC
  Administered 2020-08-27 – 2020-08-29 (×3): 6 via TOPICAL

## 2020-08-27 MED ORDER — LACTATED RINGERS IV SOLN: Freq: Once | INTRAVENOUS | Status: AC

## 2020-08-27 MED ORDER — GABAPENTIN 300 MG PO CAPS
300.0000 mg | ORAL_CAPSULE | Freq: Three times a day (TID) | ORAL | Status: DC
  Administered 2020-08-27 – 2020-08-29 (×5): 300 mg via ORAL
  Filled 2020-08-27 (×6): qty 1

## 2020-08-27 MED ORDER — MAGNESIUM SULFATE 4 GM/100ML IV SOLN
4.0000 g | Freq: Once | INTRAVENOUS | Status: AC
  Administered 2020-08-27: 4 g via INTRAVENOUS
  Filled 2020-08-27: qty 100

## 2020-08-27 MED ORDER — SODIUM CHLORIDE 0.9 % IV SOLN
2.0000 g | Freq: Three times a day (TID) | INTRAVENOUS | Status: DC
  Administered 2020-08-27: 2 g via INTRAVENOUS
  Filled 2020-08-27: qty 2

## 2020-08-27 MED ORDER — GABAPENTIN 100 MG PO CAPS
100.0000 mg | ORAL_CAPSULE | Freq: Three times a day (TID) | ORAL | Status: DC
  Administered 2020-08-27: 100 mg via ORAL
  Filled 2020-08-27: qty 1

## 2020-08-27 MED ORDER — ACETAMINOPHEN 500 MG PO TABS
1000.0000 mg | ORAL_TABLET | Freq: Once | ORAL | Status: AC
  Administered 2020-08-27: 1000 mg via ORAL
  Filled 2020-08-27: qty 2

## 2020-08-27 MED ORDER — K PHOS MONO-SOD PHOS DI & MONO 155-852-130 MG PO TABS
250.0000 mg | ORAL_TABLET | Freq: Two times a day (BID) | ORAL | Status: DC
  Administered 2020-08-27 – 2020-08-29 (×4): 250 mg via ORAL
  Filled 2020-08-27 (×4): qty 1

## 2020-08-27 MED ORDER — POTASSIUM CHLORIDE CRYS ER 20 MEQ PO TBCR
40.0000 meq | EXTENDED_RELEASE_TABLET | Freq: Once | ORAL | Status: AC
  Administered 2020-08-27: 40 meq via ORAL
  Filled 2020-08-27: qty 2

## 2020-08-27 MED ORDER — INSULIN ASPART 100 UNIT/ML ~~LOC~~ SOLN
0.0000 [IU] | Freq: Three times a day (TID) | SUBCUTANEOUS | Status: DC
  Administered 2020-08-28: 1 [IU] via SUBCUTANEOUS

## 2020-08-27 MED ORDER — ACETAMINOPHEN 325 MG PO TABS
650.0000 mg | ORAL_TABLET | Freq: Four times a day (QID) | ORAL | Status: DC | PRN
  Administered 2020-08-27: 650 mg via ORAL
  Filled 2020-08-27: qty 2

## 2020-08-27 MED ORDER — SODIUM CHLORIDE 0.9 % IV SOLN
1.0000 g | INTRAVENOUS | Status: DC
  Administered 2020-08-27: 1 g via INTRAVENOUS
  Filled 2020-08-27: qty 10

## 2020-08-27 MED ORDER — POTASSIUM CHLORIDE 10 MEQ/100ML IV SOLN
10.0000 meq | INTRAVENOUS | Status: AC
  Administered 2020-08-27 (×2): 10 meq via INTRAVENOUS
  Filled 2020-08-27 (×3): qty 100

## 2020-08-27 MED ORDER — VANCOMYCIN HCL 750 MG/150ML IV SOLN
750.0000 mg | Freq: Two times a day (BID) | INTRAVENOUS | Status: AC
  Administered 2020-08-27: 750 mg via INTRAVENOUS
  Filled 2020-08-27 (×2): qty 150

## 2020-08-27 NOTE — Sepsis Progress Note (Signed)
  Notified bedside nurse of need to administer fluid bolus and collect repeat lactic acid. Patient has been in CT scan. [name]

## 2020-08-27 NOTE — Progress Notes (Signed)
Patient seen and evaluated, chart reviewed, please see EMR for updated orders. Please see full H&P dictated by admitting physician Dr. Josephine Cables for same date of service.    Brief Summary:- 54 y.o. female with medical history significant for COPD, chronic pain syndrome, factor V Leiden, MS, hypertension, vitamin B 12 deficiency and morbid obesity admitted on 08/27/20 with sepsis presumably secondary to cellulitis   A/p  1)Severe sepsis secondary to cellulitis----okay to de-escalate to IV Rocephin -Stop cefepime  --MRSA PCR screening negative, okay to stop iv vancomycin -Blood and urine culture NGTD -Lactic acidosis improved, continue IV fluids -Fever resolving  2) hydration and hypokalemia--- continue IV fluids, okay to replace potassium  3) multiple sclerosis/chronic pain syndrome--- okay to resume home gabapentin and Percocets  4) glucose intolerance--- A1c 6.0, Use Novolog/Humalog Sliding scale insulin with Accu-Cheks/Fingersticks as ordered  5) factor V Leiden mutation--- continue Eliquis for VTE prevention  -Total care time 42 minutes   Patient seen and evaluated, chart reviewed, please see EMR for updated orders. Please see full H&P dictated by admitting physician Dr. Josephine Cables for same date of service.

## 2020-08-27 NOTE — Progress Notes (Signed)
Pharmacy Antibiotic Note  [name] is a 54 y.o. female admitted on 08/26/2020 with sepsis.  Pharmacy has been consulted for Vancomycin/Cefepime dosing. WBC WNL. Renal function ok.   Plan: Vancomycin 750 mg IV q12h Cefepime 2g IV q8h Trend WBC, temp, renal function  F/U infectious work-up Drug levels as indicated   Height: 4\' 9"  (144.8 cm) Weight: 86.2 kg (190 lb 0.6 oz) IBW/kg (Calculated) : 38.6  Temp (24hrs), Avg:104.9 F (40.5 C), Min:104.4 F (40.2 C), Max:105.3 F (40.7 C)  Recent Labs  Lab 08/26/20 2130  WBC 8.4  CREATININE 0.79  LATICACIDVEN 3.9*    Estimated Creatinine Clearance: 72.3 mL/min (by C-G formula based on SCr of 0.79 mg/dL).    Allergies  Allergen Reactions  . Ambien [Zolpidem Tartrate] Other (See Comments)    Crazy dreams  . Ceftin [Cefuroxime Axetil] Nausea And Vomiting    Pt tolerated Cefepime 09/2014 admission  . Hctz [Hydrochlorothiazide] Other (See Comments)    Urinary retention  . Lodine [Etodolac] Hives  . Promethazine Other (See Comments)    Hallucinations.   . Codeine Rash  . Latex Rash  . Penicillins Rash    Narda Bonds, PharmD, BCPS Clinical Pharmacist Phone: [phone]

## 2020-08-27 NOTE — Plan of Care (Signed)
  Problem: Education: Goal: Knowledge of General Education information will improve Description: Including pain rating scale, medication(s)/side effects and non-pharmacologic comfort measures Outcome: Progressing   Problem: Safety: Goal: Ability to remain free from injury will improve Outcome: Progressing   

## 2020-08-27 NOTE — Sepsis Progress Note (Addendum)
Notified provider of rise in lactic acid to 5.3, despite appropriate fluid resuscitation. Recommend recollect of lactic acid until it trends down unless there is another cause of rise.   Immediate ackowledgment from admitting physician.

## 2020-08-27 NOTE — H&P (Signed)
History and Physical  Russia Scheiderer NFA:[id number] DOB: [birth year] DOA: 08/26/2020  Referring physician: Dorise Bullion, MD PCP: Kathyrn Drown, MD  Patient coming from: Home  Chief Complaint: Shortness of breath  HPI: [name] is a 54 y.o. female with medical history significant for COPD, chronic pain syndrome, factor V Leiden, MS, hypertension, vitamin B 12 deficiency and morbid obesity who presents to the emergency department due to concern of shortness of breath. Patient was unable to provide detailed history of why she came to the ED possibly due to current state, history was obtained from ED physician and from ED medical record. Per report, EMS was activated and on arrival of EMS, patient was noted to be hypoxic with an O2 sat of 85% on room air and she was placed on supplemental oxygen at 3 LPM. She is reportedly bedridden at baseline.   ED Course:  In the emergency department, she was noted to be febrile with a temperature of 105.77F, she was tachycardic and tachypneic. Work-up in the ED showed H/H 15.7/48.5, mild hypokalemia, hyperglycemia, lactic acid 3.9> 5.3. Respiratory panel for influenza A, influenza B and SARS coronavirus 2 was negative. CT chest with contrast showed no acute intrathoracic pathology, no superimposed confluent pulmonary infiltrate but showed mild left basilar atelectasis. Chest x-ray showed chronic left basilar atelectasis or scarring but no active disease. Bilateral hip area was noted to be erythematous and warm to touch and was suspected to be source of infection. Tylenol was given due to fever, patient was provided with IV hydration per sepsis protocol and she was started on IV antibiotics (vancomycin and cefepime). Hospitalist was asked to admit patient for further evaluation and management.  Review of Systems:  Constitutional: Positive for chills and fever.  HENT: Negative for ear pain and sore throat.   Eyes: Negative for pain and visual disturbance.   Respiratory: Positive for shortness of breath. Negative for cough and chest tightness  Cardiovascular: Negative for chest pain and palpitations.  Gastrointestinal: Negative for abdominal pain and vomiting.  Endocrine: Negative for polyphagia and polyuria.  Genitourinary: Negative for decreased urine volume, dysuria, enuresis Musculoskeletal: Negative for arthralgias and back pain.  Skin: Negative for color change and rash.  Allergic/Immunologic: Negative for immunocompromised state.  Neurological: Negative for syncope, speech difficulty and headaches.  Hematological: Does not bruise/bleed easily.  All other systems reviewed and are negative  Past Medical History:  Diagnosis Date  . Anxiety   . Arthritis    "hands & feet" (02/11/2013)  . Ataxia   . B12 deficiency anemia    [name] 02/11/2013  . Borderline diabetes   . Chronic bronchitis ([place])    "yearly" (02/11/2013)  . Chronic edema    BLE/notes 02/11/2013  . Chronic low back pain   . Chronic pain syndrome    [name] 02/11/2013  . COPD (chronic obstructive pulmonary disease) ([place])   . Depression   . Eating disorder   . Factor V Leiden mutation, heterozygous ([place]) 01/08/2017  . Fibromyalgia   . GERD (gastroesophageal reflux disease)   . 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 Hypertension   . Impaired fasting glucose   . Migraine    "I use to" (02/11/2013)  . MS (multiple sclerosis) ([place])   . Muscle weakness of lower extremity    bilateral  . Peripheral neuropathy    [name] 02/11/2013  . Peripheral neuropathy   . PONV (postoperative nausea and vomiting)   . UTI (lower urinary tract infection) 02/11/2013   /  notes 02/11/2013   Past Surgical History:  Procedure Laterality Date  . ABDOMINAL HYSTERECTOMY  ~ 1987; ~ 2004   "[place]; [place]" (02/11/2013)  . ANTERIOR CERVICAL DECOMP/DISCECTOMY FUSION  2009; 2011  . APPENDECTOMY    . CATARACT EXTRACTION W/PHACO  06/20/2011   Procedure: CATARACT EXTRACTION  PHACO AND INTRAOCULAR LENS PLACEMENT (IOC);  Surgeon: [name] T. Gershon Crane;  Location: [facility];  Service: Ophthalmology;  Laterality: Left;  CDE 1.81  . CATARACT EXTRACTION W/PHACO  07/04/2011   Procedure: CATARACT EXTRACTION PHACO AND INTRAOCULAR LENS PLACEMENT (IOC);  Surgeon: [name] T. Gershon Crane;  Location: [facility];  Service: Ophthalmology;  Laterality: Right;  CDE: 1.76  . CESAREAN SECTION  1982; 1984; 1986  . EMBOLECTOMY Left 09/27/2014   Procedure: Left Brachial, Radial,Ulnar Embolectomy with patch angioplasty left brachial, radial and ulnar artery;  Surgeon: [name], MD;  Location: [facility];  Service: Vascular;  Laterality: Left;  . EMBOLECTOMY Left 09/27/2014   Procedure: Left Radial, Brachial, and Ulnar Thrombectomy; Left Brachial to Radial Bypass Graft using Greater Saphenous vein graft from Left Thigh; Left Saphenous Vein Harvest; Intraoperative Arteriogram; Intra-arterial administration of TPA;  Surgeon: [name], MD;  Location: [facility];  Service: Vascular;  Laterality: Left;  . LAPAROTOMY N/A 09/27/2014   Procedure: Exploratory Laparotomy, Biopsy of Perforated Gastric Ulcer, Closure with omental patch;  Surgeon: [name], MD;  Location: [facility];  Service: General;  Laterality: N/A;  . TONSILLECTOMY  1990's?  . TRACHEOSTOMY N/A december 2015  . YAG LASER APPLICATION Right 3/38/2505   Procedure: YAG LASER APPLICATION;  Surgeon: [name] T. Gershon Crane, MD;  Location: [facility];  Service: Ophthalmology;  Laterality: Right;  . YAG LASER APPLICATION Left 3/97/6734   Procedure: YAG LASER APPLICATION;  Surgeon: [name] T. Gershon Crane, MD;  Location: [facility];  Service: Ophthalmology;  Laterality: Left;    Social History:  reports that she has quit smoking. Her smoking use included cigarettes. She has a 33.00 pack-year smoking history. She quit smokeless tobacco use about 5 years ago. She reports that she does not drink alcohol and does not use drugs.   Allergies  Allergen Reactions  . Ambien [Zolpidem  Tartrate] Other (See Comments)    Crazy dreams  . Ceftin [Cefuroxime Axetil] Nausea And Vomiting    Pt tolerated Cefepime 09/2014 admission  . Hctz [Hydrochlorothiazide] Other (See Comments)    Urinary retention  . Lodine [Etodolac] Hives  . Promethazine Other (See Comments)    Hallucinations.   . Codeine Rash  . Latex Rash  . Penicillins Rash    Family History  Problem Relation Age of Onset  . Hyperlipidemia Mother   . Heart disease Mother   . Cancer Mother        lung  . Diabetes Father   . Heart disease Father   . Cancer Maternal Grandmother        breast  . Anesthesia problems Neg Hx   . Hypotension Neg Hx   . Malignant hyperthermia Neg Hx   . Pseudochol deficiency Neg Hx     Prior to Admission medications   Medication Sig Start Date End Date Taking? Authorizing Provider  ACCU-CHEK FASTCLIX LANCETS MISC USE TO TEST ONCE DAILY. 09/18/16   Kathyrn Drown, MD  Calcium Carbonate (CALCIUM 600 PO) Take by mouth. 2 daily    [provider]  clobetasol (TEMOVATE) 0.05 % external solution Apply 1 application topically 2 (two) times daily. 04/26/20   Kathyrn Drown, MD  ELIQUIS 5 MG TABS  tablet TAKE (1) TABLET BY MOUTH TWICE DAILY. 03/26/20   Kathyrn Drown, MD  gabapentin (NEURONTIN) 100 MG capsule Take 3 capsules (300 mg total) by mouth 3 (three) times daily. Tapering per written routine guidelines Patient not taking: Reported on 06/07/2020 04/19/20   Kathyrn Drown, MD  gabapentin (NEURONTIN) 100 MG capsule Take one capsule po twice daily for one week, then one capsule po for one week, then stop. Patient not taking: Reported on 06/07/2020 04/19/20   Kathyrn Drown, MD  gabapentin (NEURONTIN) 300 MG capsule Take 1 capsule (300 mg total) by mouth 3 (three) times daily. 05/05/20   Kathyrn Drown, MD  glucose blood (ONE TOUCH ULTRA TEST) test strip USE TO TEST ONCE DAILY. 07/05/18   Kathyrn Drown, MD  hydrocortisone 2.5 % cream Apply topically 2 (two) times daily as needed.  For up to 2 weeks 04/02/20   Kathyrn Drown, MD  naloxone [facility]) nasal spray 4 mg/0.1 mL Use as directed 06/08/20   Kathyrn Drown, MD  nitrofurantoin, macrocrystal-monohydrate, (MACROBID) 100 MG capsule Take 1 capsule (100 mg total) by mouth 2 (two) times daily. 07/12/20   Kathyrn Drown, MD  oxyCODONE-acetaminophen (PERCOCET/ROXICET) 5-325 MG tablet 1 q4 hours prn pain max 6 per day 07/12/20   Kathyrn Drown, MD  oxyCODONE-acetaminophen (PERCOCET/ROXICET) 5-325 MG tablet Take one tablet every 4 hours prn pain 07/12/20   Kathyrn Drown, MD  oxyCODONE-acetaminophen (PERCOCET/ROXICET) 5-325 MG tablet 1 every 4 hours as needed pain maximum 6/day 07/12/20   [name], Elayne Snare, MD  pantoprazole (PROTONIX) 40 MG tablet TAKE 1 TABLET BY MOUTH ONCE DAILY. 03/26/20   Kathyrn Drown, MD  pimecrolimus (ELIDEL) 1 % cream Apply thin amount twice daily to the affected areas for up to the next 2 weeks.  Use moisturizing lotion in the evening.  Or Vaseline.  04/05/20   Kathyrn Drown, MD  potassium chloride (KLOR-CON) 10 MEQ tablet TAKE 2 TABLETS BY MOUTH DAILY. 06/18/20   Kathyrn Drown, MD  PROAIR HFA 108 [phone] Base) MCG/ACT inhaler INHALE 2 PUFFS EVERY 6 HOURS AS NEEDED. 11/22/19   Kathyrn Drown, MD  senna (SENOKOT) 8.6 MG tablet Take 1 tablet (8.6 mg total) by mouth 2 (two) times daily. 12/10/14   Kathyrn Drown, MD  sucralfate (CARAFATE) 1 g tablet 1 pill tid 01/31/19   Kathyrn Drown, MD  Thiamine HCl (VITAMIN B-1 PO) Take by mouth daily.    [provider]  traZODone (DESYREL) 100 MG tablet TAKE 1 AND A HALF TABLET BY MOUTH AT BEDTIME. 07/16/20   Kathyrn Drown, MD  vitamin B-12 (CYANOCOBALAMIN) 1000 MCG tablet Take 1,000 mcg by mouth daily.    [provider]    Physical Exam: BP (!) 119/50   Pulse 92   Temp (!) 102.1 F (38.9 C)   Resp (!) 22   Ht 4' 9"  (1.448 m)   Wt 86.2 kg   SpO2 96%   BMI 41.12 kg/m   . General: 54 y.o. year-old female well developed well nourished in no  acute distress.  Alert and oriented x3. Marland Kitchen HEENT: Dry mucous membrane. NCAT, EOMI . Neck: Supple, trachea medial . Cardiovascular: Tachycardia. Regular rate and rhythm with no rubs or gallops.  No thyromegaly or JVD noted.  2/4 pulses in all 4 extremities. Marland Kitchen Respiratory: Clear to auscultation with no wheezes or rales. Good inspiratory effort. . Abdomen: Postsurgical abdominal scar noted. Soft, nontender nondistended with normal  bowel sounds x4 quadrants. . Muskuloskeletal: Bilateral hip (R > L) erythema warm to touch. No cyanosis, trace bilateral lower extremity edema noted  . Neuro: CN II-XII intact, strength, sensation, reflexes . Skin: No ulcerative lesions noted or rashes . Psychiatry: Judgement and insight appear normal. Mood is appropriate for condition and setting          Labs on Admission:  Basic Metabolic Panel: Recent Labs  Lab 08/26/20 2130  NA 137  K 3.4*  CL 100  CO2 26  GLUCOSE 257*  BUN 10  CREATININE 0.79  CALCIUM 8.4*   Liver Function Tests: Recent Labs  Lab 08/26/20 2130  AST 17  ALT 12  ALKPHOS 82  BILITOT 0.4  PROT 7.5  ALBUMIN 3.7   No results for input(s): LIPASE, AMYLASE in the last 168 hours. No results for input(s): AMMONIA in the last 168 hours. CBC: Recent Labs  Lab 08/26/20 2130  WBC 8.4  NEUTROABS 7.3  HGB 15.7*  HCT 48.5*  MCV 106.1*  PLT 178   Cardiac Enzymes: No results for input(s): CKTOTAL, CKMB, CKMBINDEX, TROPONINI in the last 168 hours.  BNP (last 3 results) No results for input(s): BNP in the last 8760 hours.  ProBNP (last 3 results) No results for input(s): PROBNP in the last 8760 hours.  CBG: No results for input(s): GLUCAP in the last 168 hours.  Radiological Exams on Admission: CT Chest W Contrast  Result Date: 08/27/2020 CLINICAL DATA:  Respiratory distress, multiple sclerosis, COPD, dyspnea EXAM: CT CHEST WITH CONTRAST TECHNIQUE: Multidetector CT imaging of the chest was performed during intravenous  contrast administration. CONTRAST:  65m OMNIPAQUE IOHEXOL 300 MG/ML  SOLN COMPARISON:  None. FINDINGS: Cardiovascular: No significant coronary artery calcification. Global cardiac size within normal limits. No pericardial effusion. The central pulmonary arteries are enlarged in keeping with changes of pulmonary arterial hypertension. The thoracic aorta demonstrates mild atherosclerotic calcification, but is not dilated. Mediastinum/Nodes: No enlarged mediastinal, hilar, or axillary lymph nodes. Thyroid gland, trachea, and esophagus demonstrate no significant findings. Lungs/Pleura: Discoid atelectasis within the left lung base. No superimposed confluent pulmonary infiltrate. No pneumothorax or pleural effusion. The central airways are widely patent. Upper Abdomen: No acute abnormality. Musculoskeletal: No lytic or blastic bone lesions are identified. IMPRESSION: Mild left basilar atelectasis. No superimposed confluent pulmonary infiltrate. No acute intrathoracic pathology identified. Morphologic changes compatible with pulmonary arterial hypertension. Aortic Atherosclerosis (ICD10-I70.0). Electronically Signed   By: AFidela SalisburyMD   On: 08/27/2020 00:38   DG Chest Port 1 View  Result Date: 08/26/2020 CLINICAL DATA:  Questionable sepsis EXAM: PORTABLE CHEST 1 VIEW COMPARISON:  10/18/2014 FINDINGS: Heart is borderline in size. Left base opacity could reflect atelectasis or scarring. Right lung clear. No effusions. IMPRESSION: Chronic left basilar atelectasis or scarring. No active disease. Electronically Signed   By: KRolm BaptiseM.D.   On: 08/26/2020 22:26    EKG: I independently viewed the EKG done and my findings are as followed: Sinus tachycardia at rate of 118 bpm with APCs  Assessment/Plan Present on Admission: . Acute febrile illness . Prediabetes . Factor V Leiden mutation, heterozygous ([facility]  Active Problems:   Hypokalemia   Hyperglycemia   Prediabetes   Severe sepsis (HCC)   Factor V  Leiden mutation, heterozygous (HCC)   Acute febrile illness   Lactic acidosis   Dehydration   GERD (gastroesophageal reflux disease)   Cellulitis   Acute febrile illness secondary to severe sepsis due to possible cellulitis Lactic acidosis in the setting  of above Patient was tachycardic and febrile (met SIRS criteria), bilateral hip areas with erythema warm to touch (areas affected marked) as a suspicion for source of infection. Patient presents with lactic acid >4.0 thereby meeting VS sepsis criteria IV hydration per sepsis protocol was provided; continue IV LR Patient was started on IV antibiotics (vancomycin and cefepime), we shall continue with same at this time with plan to de-escalate based on blood culture Actiq acid 3.9 > 5.3; continue to trend lactic acid Continue Tylenol 650 mg p.o. every 6 hours as needed for fever  Hypokalemia K+ 3.4, this will be replenished  Dehydration Continue IV hydration  Hyperglycemia in a patient with history of prediabetes Blood glucose level= 257; last hemoglobin A1c on record on 10/04/2018 was 5.6 Hemoglobin A1c will be checked.  GERD Continue Protonix  Factor V Leiden mutation Continue home Eliquis   DVT prophylaxis: Eliquis  Code Status: Full code  Family Communication: None at bedside  Disposition Plan:  Patient is from:                        home Anticipated DC to:                   SNF or family members home Anticipated DC date:               2-3 days Anticipated DC barriers:           Unstable to discharge at this time due to severe sepsis requiring IV antibiotic treatment.   Consults called: None  Admission status: Inpatient    Bernadette Hoit MD [facility]  08/27/2020, 3:35 AM

## 2020-08-28 LAB — BLOOD CULTURE ID PANEL (REFLEXED) - BCID2

## 2020-08-28 LAB — URINE CULTURE: Culture: NO GROWTH

## 2020-08-28 LAB — GLUCOSE, CAPILLARY
Glucose-Capillary: 125 mg/dL — ABNORMAL HIGH (ref 70–99)
Glucose-Capillary: 164 mg/dL — ABNORMAL HIGH (ref 70–99)
Glucose-Capillary: 142 mg/dL — ABNORMAL HIGH (ref 70–99)
Glucose-Capillary: 127 mg/dL — ABNORMAL HIGH (ref 70–99)

## 2020-08-28 LAB — CBC
HCT: 43.3 % (ref 36.0–46.0)
Hemoglobin: 13.8 g/dL (ref 12.0–15.0)
MCH: 33.5 pg (ref 26.0–34.0)
MCHC: 31.9 g/dL (ref 30.0–36.0)
MCV: 105.1 fL — ABNORMAL HIGH (ref 80.0–100.0)
Platelets: 150 10*3/uL (ref 150–400)
RBC: 4.12 MIL/uL (ref 3.87–5.11)
RDW: 11.7 % (ref 11.5–15.5)
WBC: 10 10*3/uL (ref 4.0–10.5)
nRBC: 0 % (ref 0.0–0.2)

## 2020-08-28 LAB — BASIC METABOLIC PANEL
Anion gap: 8 (ref 5–15)
BUN: 9 mg/dL (ref 6–20)
CO2: 28 mmol/L (ref 22–32)
Calcium: 8.2 mg/dL — ABNORMAL LOW (ref 8.9–10.3)
Chloride: 104 mmol/L (ref 98–111)
Creatinine, Ser: 0.58 mg/dL (ref 0.44–1.00)
GFR, Estimated: >60 mL/min (ref >60)
Glucose, Bld: 162 mg/dL — ABNORMAL HIGH (ref 70–99)
Potassium: 3.9 mmol/L (ref 3.5–5.1)
Sodium: 140 mmol/L (ref 135–145)

## 2020-08-28 LAB — MAGNESIUM: Magnesium: 2 mg/dL (ref 1.7–2.4)

## 2020-08-28 MED ORDER — APIXABAN 5 MG PO TABS
5.0000 mg | ORAL_TABLET | Freq: Two times a day (BID) | ORAL | Status: DC
  Administered 2020-08-28 – 2020-08-29 (×3): 5 mg via ORAL
  Filled 2020-08-28 (×3): qty 1

## 2020-08-28 MED ORDER — PANTOPRAZOLE SODIUM 40 MG PO TBEC
40.0000 mg | DELAYED_RELEASE_TABLET | Freq: Every day | ORAL | Status: DC
  Administered 2020-08-28 – 2020-08-29 (×2): 40 mg via ORAL
  Filled 2020-08-28 (×2): qty 1

## 2020-08-28 MED ORDER — TRAZODONE HCL 50 MG PO TABS
150.0000 mg | ORAL_TABLET | Freq: Every day | ORAL | Status: DC
  Administered 2020-08-28: 150 mg via ORAL
  Filled 2020-08-28: qty 3

## 2020-08-28 MED ORDER — SODIUM CHLORIDE 0.9 % IV SOLN
2.0000 g | INTRAVENOUS | Status: DC
  Administered 2020-08-28 – 2020-08-29 (×2): 2 g via INTRAVENOUS
  Filled 2020-08-28 (×2): qty 20

## 2020-08-28 NOTE — Progress Notes (Signed)
Patient Demographics:    [name], is a 54 y.o. female, DOB - 11/29/1965, QHU:[id number]  Admit date - 08/26/2020   Admitting Physician Bernadette Hoit, DO  Outpatient Primary MD for the patient is Kathyrn Drown, MD  LOS - 1   Chief Complaint  Patient presents with  . Shortness of Breath        Subjective:    [name] today has no fevers, no emesis,  No chest pain,   -Shortness of breath and fatigue persist  Assessment  & Plan :    Active Problems:   Hypokalemia   Hyperglycemia   Prediabetes   Severe sepsis (HCC)   Factor V Leiden mutation, heterozygous (HCC)   Acute febrile illness   Lactic acidosis   Dehydration   GERD (gastroesophageal reflux disease)   Cellulitis   Brief Summary:- 54 y.o.femalewith medical history significant forCOPD, chronic pain syndrome, factor V Leiden, MS, hypertension, vitamin B 12 deficiency and morbid obesity admitted on 08/27/20 with sepsis presumably secondary to cellulitis  A/p  1)Severe sepsis secondary to bilateral upper thigh/hip area cellulitis---- -preliminary blood cultures from 08/26/2020 with staph species (No Staph Aureus)  continue IV Rocephin 2 g every 24 hours as per cellulitis order set-- -Stopped cefepime  --MRSA PCR screening negative, so stopped iv vancomycin -Blood and urine culture NGTD -Lactic acidosis improved with  IV fluids -Fever resolved -WBC is down to 10.0 from 11.2  2)Dehydration and hypokalemia/hypophosphatemia/hypomagnesemia --- continue IV fluids, electrolytes normalized after replacement  3) multiple sclerosis/chronic pain syndrome--- continue home regimen including gabapentin and Percocets  4)DM2-- A1c 6.0,  reflecting excellent control PTA  --use Novolog/Humalog Sliding scale insulin with Accu-Cheks/Fingersticks as ordered  5)Factor V Leiden mutation--- continue Eliquis for VTE  prevention  Disposition/Need for in-Hospital Stay- patient unable to be discharged at this time due to -severe sepsis secondary to bilateral upper thigh/hip area cellulitis requiring IV antibiotics pending further culture data and clinical improvement  Status is: Inpatient  Remains inpatient appropriate because:severe sepsis secondary to bilateral upper thigh/hip area cellulitis requiring IV antibiotics pending further culture data and clinical improvement   Disposition: The patient is from: Home              Anticipated d/c is to: Home              Anticipated d/c date is: 2 days              Patient currently is not medically stable to d/c. Barriers: Not Clinically Stable- severe sepsis secondary to bilateral upper thigh/hip area cellulitis requiring IV antibiotics pending further culture data and clinical improvement  Code Status : Full code  Family Communication:  (patient is alert, awake and coherent)   Consults  :  na  DVT Prophylaxis  : apixaban - SCDs   Lab Results  Component Value Date   PLT 150 08/28/2020    Inpatient Medications  Scheduled Meds: . apixaban  5 mg Oral BID  . Chlorhexidine Gluconate Cloth  6 each Topical Daily  . gabapentin  300 mg Oral TID  . insulin aspart  0-6 Units Subcutaneous TID WC  . pantoprazole  40 mg Oral Daily  . phosphorus  250 mg Oral BID   Continuous Infusions: .  cefTRIAXone (ROCEPHIN)  IV    . lactated ringers 50 mL/hr at 08/27/20 1831   PRN Meds:.acetaminophen, oxyCODONE-acetaminophen    Anti-infectives (From admission, onward)   Start     Dose/Rate Route Frequency Ordered Stop   08/28/20 1245  cefTRIAXone (ROCEPHIN) 2 g in sodium chloride 0.9 % 100 mL IVPB        2 g 200 mL/hr over 30 Minutes Intravenous Every 24 hours 08/28/20 1156     08/27/20 1400  cefTRIAXone (ROCEPHIN) 1 g in sodium chloride 0.9 % 100 mL IVPB  Status:  Discontinued        1 g 200 mL/hr over 30 Minutes Intravenous Every 24 hours 08/27/20 1034  08/28/20 1156   08/27/20 1000  vancomycin (VANCOREADY) IVPB 750 mg/150 mL        750 mg 150 mL/hr over 60 Minutes Intravenous Every 12 hours 08/27/20 0056 08/27/20 2049   08/27/20 0600  ceFEPIme (MAXIPIME) 2 g in sodium chloride 0.9 % 100 mL IVPB  Status:  Discontinued        2 g 200 mL/hr over 30 Minutes Intravenous Every 8 hours 08/27/20 0056 08/27/20 1033   08/26/20 2200  ceFEPIme (MAXIPIME) 2 g in sodium chloride 0.9 % 100 mL IVPB        2 g 200 mL/hr over 30 Minutes Intravenous  Once 08/26/20 2147 08/26/20 2256   08/26/20 2200  metroNIDAZOLE (FLAGYL) IVPB 500 mg        500 mg 100 mL/hr over 60 Minutes Intravenous  Once 08/26/20 2147 08/27/20 0044   08/26/20 2200  vancomycin (VANCOCIN) IVPB 1000 mg/200 mL premix  Status:  Discontinued        1,000 mg 200 mL/hr over 60 Minutes Intravenous  Once 08/26/20 2147 08/26/20 2150   08/26/20 2200  vancomycin (VANCOREADY) IVPB 1500 mg/300 mL        1,500 mg 150 mL/hr over 120 Minutes Intravenous  Once 08/26/20 2150 08/27/20 0044        Objective:   Vitals:   08/28/20 0900 08/28/20 1000 08/28/20 1100 08/28/20 1148  BP: (!) 144/95     Pulse: 88 90 92 94  Resp: 16 20 20 16   Temp:    98.4 F (36.9 C)  TempSrc:    Oral  SpO2: 98% 95% 93% 96%  Weight:      Height:        Wt Readings from Last 3 Encounters:  08/26/20 86.2 kg  05/14/18 86.2 kg  10/12/16 74.8 kg     Intake/Output Summary (Last 24 hours) at 08/28/2020 1205 Last data filed at 08/28/2020 0800 Gross per 24 hour  Intake 3525.89 ml  Output 1650 ml  Net 1875.89 ml     Physical Exam  Gen:- Awake Alert,  Obese, chronically ill-appearing HEENT:- [place].AT, No sclera icterus Nose-  3L/min Neck-Supple Neck,No JVD,.  Lungs-diminished in bases, no wheezing  CV- S1, S2 normal, regular  Abd-  +ve B.Sounds, Abd Soft, No tenderness,    Extremity/Skin:-+1 edema, pedal pulses present , erythema, warmth and tenderness over bilateral upper thighs/lateral hips area Psych-affect  is appropriate, oriented x3 Neuro-generalized weakness, chronic neuromuscular deficits , no additional new focal deficits, no tremors   Data Review:   Micro Results Recent Results (from the past 240 hour(s))  Blood Culture (routine x 2)     Status: None (Preliminary result)   Collection Time: 08/26/20  9:30 PM   Specimen: BLOOD RIGHT ARM  Result Value Ref Range Status   Specimen  Description BLOOD RIGHT ARM  Final   Special Requests   Final    BOTTLES DRAWN AEROBIC AND ANAEROBIC Blood Culture results may not be optimal due to an inadequate volume of blood received in culture bottles   Culture   Final    NO GROWTH 2 DAYS Performed at [facility], [address] [place], [place] [address]    Report Status PENDING  Incomplete  Blood Culture (routine x 2)     Status: None (Preliminary result)   Collection Time: 08/26/20  9:30 PM   Specimen: BLOOD RIGHT ARM  Result Value Ref Range Status   Specimen Description   Final    BLOOD RIGHT ARM Performed at [facility], [address], [place], [place] [address]    Special Requests   Final    BOTTLES DRAWN AEROBIC AND ANAEROBIC Blood Culture adequate volume Performed at [facility], [address], [place], [place] [address]    Culture  Setup Time   Final    GRAM POSITIVE COCCI Gram Stain Report Called to,Read Back By and Verified With: DANIELS,J ON 08/27/20 AT 2115 BY LOY,C ANAEROBIC BOTTLE Performed at [facility] Organism ID to follow CRITICAL RESULT CALLED TO, READ BACK BY AND VERIFIED WITH: Clarene Essex RN 08/28/20 0156 JDW Performed at [facility], [place] [address], [place], [place] [address]    Culture   Final    NO GROWTH 2 DAYS Performed at [facility], [address] [place], [place] [address]    Report Status PENDING  Incomplete  Blood Culture ID Panel (Reflexed)     Status: Abnormal   Collection Time: 08/26/20  9:30 PM  Result Value Ref Range Status   Enterococcus faecalis NOT DETECTED NOT DETECTED Final    Enterococcus Faecium NOT DETECTED NOT DETECTED Final   Listeria monocytogenes NOT DETECTED NOT DETECTED Final   Staphylococcus species DETECTED (A) NOT DETECTED Final    Comment: CRITICAL RESULT CALLED TO, READ BACK BY AND VERIFIED WITH: J DANIELS RN 08/28/20 0156 JDW    Staphylococcus aureus (BCID) NOT DETECTED NOT DETECTED Final   Staphylococcus epidermidis NOT DETECTED NOT DETECTED Final   Staphylococcus lugdunensis NOT DETECTED NOT DETECTED Final   Streptococcus species NOT DETECTED NOT DETECTED Final   Streptococcus agalactiae NOT DETECTED NOT DETECTED Final   Streptococcus pneumoniae NOT DETECTED NOT DETECTED Final   Streptococcus pyogenes NOT DETECTED NOT DETECTED Final   A.calcoaceticus-baumannii NOT DETECTED NOT DETECTED Final   Bacteroides fragilis NOT DETECTED NOT DETECTED Final   Enterobacterales NOT DETECTED NOT DETECTED Final   Enterobacter cloacae complex NOT DETECTED NOT DETECTED Final   Escherichia coli NOT DETECTED NOT DETECTED Final   Klebsiella aerogenes NOT DETECTED NOT DETECTED Final   Klebsiella oxytoca NOT DETECTED NOT DETECTED Final   Klebsiella pneumoniae NOT DETECTED NOT DETECTED Final   Proteus species NOT DETECTED NOT DETECTED Final   Salmonella species NOT DETECTED NOT DETECTED Final   Serratia marcescens NOT DETECTED NOT DETECTED Final   Haemophilus influenzae NOT DETECTED NOT DETECTED Final   Neisseria meningitidis NOT DETECTED NOT DETECTED Final   Pseudomonas aeruginosa NOT DETECTED NOT DETECTED Final   Stenotrophomonas maltophilia NOT DETECTED NOT DETECTED Final   Candida albicans NOT DETECTED NOT DETECTED Final   Candida auris NOT DETECTED NOT DETECTED Final   Candida glabrata NOT DETECTED NOT DETECTED Final   Candida krusei NOT DETECTED NOT DETECTED Final   Candida parapsilosis NOT DETECTED NOT DETECTED Final   Candida tropicalis NOT DETECTED NOT  DETECTED Final   Cryptococcus neoformans/gattii NOT DETECTED NOT DETECTED Final    Comment: Performed  at [facility], [place] [address], [place], [place] [address]  Resp Panel by RT-PCR (Flu A&B, Covid) Nasopharyngeal Swab     Status: None   Collection Time: 08/26/20  9:36 PM   Specimen: Nasopharyngeal Swab; Nasopharyngeal(NP) swabs in vial transport medium  Result Value Ref Range Status   SARS Coronavirus 2 by RT PCR NEGATIVE NEGATIVE Final    Comment: (NOTE) SARS-CoV-2 target nucleic acids are NOT DETECTED.  The SARS-CoV-2 RNA is generally detectable in upper respiratory specimens during the acute phase of infection. The lowest concentration of SARS-CoV-2 viral copies this assay can detect is 138 copies/mL. A negative result does not preclude SARS-Cov-2 infection and should not be used as the sole basis for treatment or other patient management decisions. A negative result may occur with  improper specimen collection/handling, submission of specimen other than nasopharyngeal swab, presence of viral mutation(s) within the areas targeted by this assay, and inadequate number of viral copies(<138 copies/mL). A negative result must be combined with clinical observations, patient history, and epidemiological information. The expected result is Negative.  Fact Sheet for Patients:  EntrepreneurPulse.com.au  Fact Sheet for Healthcare Providers:  IncredibleEmployment.be  This test is no t yet approved or cleared by the [place] FDA and  has been authorized for detection and/or diagnosis of SARS-CoV-2 by FDA under an Emergency Use Authorization (EUA). This EUA will remain  in effect (meaning this test can be used) for the duration of the COVID-19 declaration under Section 564(b)(1) of the Act, 21 U.S.C.section 360bbb-3(b)(1), unless the authorization is terminated  or revoked sooner.       Influenza A by PCR NEGATIVE NEGATIVE Final   Influenza B by PCR NEGATIVE NEGATIVE Final    Comment: (NOTE) The Xpert Xpress SARS-CoV-2/FLU/RSV plus assay  is intended as an aid in the diagnosis of influenza from Nasopharyngeal swab specimens and should not be used as a sole basis for treatment. Nasal washings and aspirates are unacceptable for Xpert Xpress SARS-CoV-2/FLU/RSV testing.  Fact Sheet for Patients: EntrepreneurPulse.com.au  Fact Sheet for Healthcare Providers: IncredibleEmployment.be  This test is not yet approved or cleared by the [place] FDA and has been authorized for detection and/or diagnosis of SARS-CoV-2 by FDA under an Emergency Use Authorization (EUA). This EUA will remain in effect (meaning this test can be used) for the duration of the COVID-19 declaration under Section 564(b)(1) of the Act, 21 U.S.C. section 360bbb-3(b)(1), unless the authorization is terminated or revoked.  Performed at [facility], [address], [place], [place] [address]   Urine culture     Status: None   Collection Time: 08/26/20 10:54 PM   Specimen: In/Out Cath Urine  Result Value Ref Range Status   Specimen Description   Final    IN/OUT CATH URINE Performed at [facility], [address] [place], [place] [address]    Special Requests   Final    NONE Performed at [facility], [address] [place], [place] [address]    Culture   Final    NO GROWTH Performed at [facility], [place] [address], [place], [name] [address]    Report Status 08/28/2020 FINAL  Final  MRSA PCR Screening     Status: None   Collection Time: 08/27/20  6:45 AM   Specimen: Nasal Mucosa; Nasopharyngeal  Result Value Ref Range Status   MRSA by PCR NEGATIVE NEGATIVE Final  Comment:        The GeneXpert MRSA Assay (FDA approved for NASAL specimens only), is one component of a comprehensive MRSA colonization surveillance program. It is not intended to diagnose MRSA infection nor to guide or monitor treatment for MRSA infections. Performed at [facility], [address] [place], [place] [address]      Radiology Reports CT Chest W Contrast  Result Date: 08/27/2020 CLINICAL DATA:  Respiratory distress, multiple sclerosis, COPD, dyspnea EXAM: CT CHEST WITH CONTRAST TECHNIQUE: Multidetector CT imaging of the chest was performed during intravenous contrast administration. CONTRAST:  52mL OMNIPAQUE IOHEXOL 300 MG/ML  SOLN COMPARISON:  None. FINDINGS: Cardiovascular: No significant coronary artery calcification. Global cardiac size within normal limits. No pericardial effusion. The central pulmonary arteries are enlarged in keeping with changes of pulmonary arterial hypertension. The thoracic aorta demonstrates mild atherosclerotic calcification, but is not dilated. Mediastinum/Nodes: No enlarged mediastinal, hilar, or axillary lymph nodes. Thyroid gland, trachea, and esophagus demonstrate no significant findings. Lungs/Pleura: Discoid atelectasis within the left lung base. No superimposed confluent pulmonary infiltrate. No pneumothorax or pleural effusion. The central airways are widely patent. Upper Abdomen: No acute abnormality. Musculoskeletal: No lytic or blastic bone lesions are identified. IMPRESSION: Mild left basilar atelectasis. No superimposed confluent pulmonary infiltrate. No acute intrathoracic pathology identified. Morphologic changes compatible with pulmonary arterial hypertension. Aortic Atherosclerosis (ICD10-I70.0). Electronically Signed   By: Fidela Salisbury MD   On: 08/27/2020 00:38   DG Chest Port 1 View  Result Date: 08/26/2020 CLINICAL DATA:  Questionable sepsis EXAM: PORTABLE CHEST 1 VIEW COMPARISON:  10/18/2014 FINDINGS: Heart is borderline in size. Left base opacity could reflect atelectasis or scarring. Right lung clear. No effusions. IMPRESSION: Chronic left basilar atelectasis or scarring. No active disease. Electronically Signed   By: Rolm Baptise M.D.   On: 08/26/2020 22:26     CBC Recent Labs  Lab 08/26/20 2130 08/27/20 0829 08/28/20 0514  WBC 8.4 11.2* 10.0  HGB  15.7* 15.0 13.8  HCT 48.5* 47.2* 43.3  PLT 178 146* 150  MCV 106.1* 107.0* 105.1*  MCH 34.4* 34.0 33.5  MCHC 32.4 31.8 31.9  RDW 11.8 11.6 11.7  LYMPHSABS 0.6*  --   --   MONOABS 0.4  --   --   EOSABS 0.0  --   --   BASOSABS 0.0  --   --     Chemistries  Recent Labs  Lab 08/26/20 2130 08/27/20 0100 08/27/20 0829 08/28/20 0514  NA 137  --  139 140  K 3.4*  --  4.0 3.9  CL 100  --  100 104  CO2 26  --  28 28  GLUCOSE 257*  --  182* 162*  BUN 10  --  9 9  CREATININE 0.79  --  0.67 0.58  CALCIUM 8.4*  --  8.4* 8.2*  MG  --  1.4*  --  2.0  AST 17  --  14*  --   ALT 12  --  12  --   ALKPHOS 82  --  60  --   BILITOT 0.4  --  0.9  --    ------------------------------------------------------------------------------------------------------------------ No results for input(s): CHOL, HDL, LDLCALC, TRIG, CHOLHDL, LDLDIRECT in the last 72 hours.  Lab Results  Component Value Date   HGBA1C 6.0 (H) 08/27/2020   ------------------------------------------------------------------------------------------------------------------ No results for input(s): TSH, T4TOTAL, T3FREE, THYROIDAB in the last 72 hours.  Invalid input(s): FREET3 ------------------------------------------------------------------------------------------------------------------ No results for input(s): VITAMINB12, FOLATE, FERRITIN, TIBC, IRON, RETICCTPCT in the  last 72 hours.  Coagulation profile Recent Labs  Lab 08/26/20 2223 08/27/20 0829  INR 1.4* 1.3*    No results for input(s): DDIMER in the last 72 hours.  Cardiac Enzymes No results for input(s): CKMB, TROPONINI, MYOGLOBIN in the last 168 hours.  Invalid input(s): CK ------------------------------------------------------------------------------------------------------------------ No results found for: BNP   Roxan Hockey M.D on 08/28/2020 at 12:05 PM  Go to [email or web address] - for contact info  [facility] - Office  [phone]

## 2020-08-29 LAB — GLUCOSE, CAPILLARY
Glucose-Capillary: 125 mg/dL — ABNORMAL HIGH (ref 70–99)
Glucose-Capillary: 116 mg/dL — ABNORMAL HIGH (ref 70–99)

## 2020-08-29 MED ORDER — CEFDINIR 300 MG PO CAPS: 300.0000 mg | ORAL_CAPSULE | Freq: Two times a day (BID) | ORAL | 0 refills | Status: AC

## 2020-08-29 MED ORDER — VITAMIN B-1 100 MG PO TABS: 100.0000 mg | ORAL_TABLET | Freq: Every day | ORAL | 3 refills | Status: AC

## 2020-08-29 MED ORDER — CEFDINIR 300 MG PO CAPS: 300.0000 mg | ORAL_CAPSULE | Freq: Two times a day (BID) | ORAL | 0 refills | Status: DC

## 2020-08-29 MED ORDER — SENNOSIDES-DOCUSATE SODIUM 8.6-50 MG PO TABS: 2.0000 | ORAL_TABLET | Freq: Every day | ORAL | 11 refills | Status: AC

## 2020-08-29 MED ORDER — SENNOSIDES-DOCUSATE SODIUM 8.6-50 MG PO TABS: 2.0000 | ORAL_TABLET | Freq: Every day | ORAL | 11 refills | Status: DC

## 2020-08-29 MED ORDER — ALBUTEROL SULFATE HFA 108 (90 BASE) MCG/ACT IN AERS
2.0000 | INHALATION_SPRAY | Freq: Four times a day (QID) | RESPIRATORY_TRACT | 3 refills | Status: DC | PRN
Start: 2020-08-29 — End: 2021-05-27

## 2020-08-29 NOTE — Progress Notes (Signed)
EMS here to take patient home. Discharge information given to patient and explanation and teaching given to [facility], patient's significant other and care giver. IVs removed and all belongings sent with the patient.  Tilda Burrow, RN

## 2020-08-29 NOTE — Discharge Instructions (Signed)
1)At this time you have refused home health visiting registered nurse, physical therapist and home health nurse aide--- if you change your mind and want to do services please contact the primary care physician to help set them up  2) you are taking Eliquis/apixaban which is your blood thinner so please Avoid ibuprofen/Advil/Aleve/Motrin/Goody Powders/Naproxen/BC powders/Meloxicam/Diclofenac/Indomethacin and other Nonsteroidal anti-inflammatory medications as these will make you more likely to bleed and can cause stomach ulcers, can also cause Kidney problems.   3) follow-up/recheck with the primary care physician in about a week advised

## 2020-08-29 NOTE — Discharge Summary (Signed)
[name], is a 54 y.o. female  DOB [birth year]  MRN [id number].  Admission date:  08/26/2020  Admitting Physician  Bernadette Hoit, DO  Discharge Date:  08/29/2020   Primary MD  Kathyrn Drown, MD  Recommendations for primary care physician for things to follow:   1)At this time you have refused home health visiting registered nurse, physical therapist and home health nurse aide--- if you change your mind and want to do services please contact the primary care physician to help set them up  2) you are taking Eliquis/apixaban which is your blood thinner so please Avoid ibuprofen/Advil/Aleve/Motrin/Goody Powders/Naproxen/BC powders/Meloxicam/Diclofenac/Indomethacin and other Nonsteroidal anti-inflammatory medications as these will make you more likely to bleed and can cause stomach ulcers, can also cause Kidney problems.   3) follow-up/recheck with the primary care physician in about a week advised   Admission Diagnosis  Hypoxia [R09.02] Acute febrile illness [R50.9] Severe sepsis ([place]) [A41.9, R65.20] Fever, unspecified fever cause [R50.9]   Discharge Diagnosis  Hypoxia [R09.02] Acute febrile illness [R50.9] Severe sepsis ([place]) [A41.9, R65.20] Fever, unspecified fever cause [R50.9]    Active Problems:   Hypokalemia   Hyperglycemia   Prediabetes   Severe sepsis (HCC)   Factor V Leiden mutation, heterozygous ([place])   Acute febrile illness   Lactic acidosis   Dehydration   GERD (gastroesophageal reflux disease)   Cellulitis      Past Medical History:  Diagnosis Date  . Anxiety   . Arthritis    "hands & feet" (02/11/2013)  . Ataxia   . B12 deficiency anemia    [name] 02/11/2013  . Borderline diabetes   . Chronic bronchitis ([place])    "yearly" (02/11/2013)  . Chronic edema    BLE/notes 02/11/2013  . Chronic low back pain   . Chronic pain syndrome    [name] 02/11/2013  . COPD (chronic  obstructive pulmonary disease) ([place])   . Depression   . Eating disorder   . Factor V Leiden mutation, heterozygous ([place]) 01/08/2017  . Fibromyalgia   . GERD (gastroesophageal reflux disease)   . 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 Hypertension   . Impaired fasting glucose   . Migraine    "I use to" (02/11/2013)  . MS (multiple sclerosis) ([place])   . Muscle weakness of lower extremity    bilateral  . Peripheral neuropathy    [name] 02/11/2013  . Peripheral neuropathy   . PONV (postoperative nausea and vomiting)   . UTI (lower urinary tract infection) 02/11/2013   [name] 02/11/2013    Past Surgical History:  Procedure Laterality Date  . ABDOMINAL HYSTERECTOMY  ~ 1987; ~ 2004   "[place]; [place]" (02/11/2013)  . ANTERIOR CERVICAL DECOMP/DISCECTOMY FUSION  2009; 2011  . APPENDECTOMY    . CATARACT EXTRACTION W/PHACO  06/20/2011   Procedure: CATARACT EXTRACTION PHACO AND INTRAOCULAR LENS PLACEMENT (IOC);  Surgeon: [name] T. Gershon Crane;  Location: [facility];  Service: Ophthalmology;  Laterality: Left;  CDE 1.81  . CATARACT EXTRACTION W/PHACO  07/04/2011  Procedure: CATARACT EXTRACTION PHACO AND INTRAOCULAR LENS PLACEMENT (IOC);  Surgeon: [name] T. Gershon Crane;  Location: [facility];  Service: Ophthalmology;  Laterality: Right;  CDE: 1.76  . CESAREAN SECTION  1982; 1984; 1986  . EMBOLECTOMY Left 09/27/2014   Procedure: Left Brachial, Radial,Ulnar Embolectomy with patch angioplasty left brachial, radial and ulnar artery;  Surgeon: [name], MD;  Location: [facility];  Service: Vascular;  Laterality: Left;  . EMBOLECTOMY Left 09/27/2014   Procedure: Left Radial, Brachial, and Ulnar Thrombectomy; Left Brachial to Radial Bypass Graft using Greater Saphenous vein graft from Left Thigh; Left Saphenous Vein Harvest; Intraoperative Arteriogram; Intra-arterial administration of TPA;  Surgeon: [name], MD;  Location: [facility];  Service: Vascular;  Laterality: Left;  . LAPAROTOMY N/A  09/27/2014   Procedure: Exploratory Laparotomy, Biopsy of Perforated Gastric Ulcer, Closure with omental patch;  Surgeon: [name], MD;  Location: [facility];  Service: General;  Laterality: N/A;  . TONSILLECTOMY  1990's?  . TRACHEOSTOMY N/A december 2015  . YAG LASER APPLICATION Right 5/57/3220   Procedure: YAG LASER APPLICATION;  Surgeon: [name] T. Gershon Crane, MD;  Location: [facility];  Service: Ophthalmology;  Laterality: Right;  . YAG LASER APPLICATION Left 2/54/2706   Procedure: YAG LASER APPLICATION;  Surgeon: [name] T. Gershon Crane, MD;  Location: [facility];  Service: Ophthalmology;  Laterality: Left;     HPI  from the history and physical done on the day of admission:    Chief Complaint: Shortness of breath  HPI: [name] is a 54 y.o. female with medical history significant for COPD, chronic pain syndrome, factor V Leiden, MS, hypertension, vitamin B 12 deficiency and morbid obesity who presents to the emergency department due to concern of shortness of breath. Patient was unable to provide detailed history of why she came to the ED possibly due to current state, history was obtained from ED physician and from ED medical record. Per report, EMS was activated and on arrival of EMS, patient was noted to be hypoxic with an O2 sat of 85% on room air and she was placed on supplemental oxygen at 3 LPM. She is reportedly bedridden at baseline.   ED Course:  In the emergency department, she was noted to be febrile with a temperature of 105.52F, she was tachycardic and tachypneic. Work-up in the ED showed H/H 15.7/48.5, mild hypokalemia, hyperglycemia, lactic acid 3.9> 5.3. Respiratory panel for influenza A, influenza B and SARS coronavirus 2 was negative. CT chest with contrast showed no acute intrathoracic pathology, no superimposed confluent pulmonary infiltrate but showed mild left basilar atelectasis. Chest x-ray showed chronic left basilar atelectasis or scarring but no active disease. Bilateral hip area  was noted to be erythematous and warm to touch and was suspected to be source of infection. Tylenol was given due to fever, patient was provided with IV hydration per sepsis protocol and she was started on IV antibiotics (vancomycin and cefepime). Hospitalist was asked to admit patient for further evaluation and management.    Hospital Course:    Brief Summary:- 54 y.o.femalewith medical history significant forCOPD, chronic pain syndrome, factor V Leiden, MS, hypertension, vitamin B 12 deficiency and morbid obesityadmittedon 11/19/21with sepsis presumably secondary to cellulitis  A/p  1)Severesepsis secondary to bilateral upper thigh/hip area cellulitis---- -preliminary blood cultures from 08/26/2020 with staph species (No Staph Aureus)  -In retrospect blood culture from  08/26/2020 with Staph hominis and Staphylococcus capitis is most likely a contaminant -Patient was treated initially with vancomycin and cefepime, transition to IV  Rocephin 2 g every 24 hours as per cellulitis order set-- -Stopped cefepime --MRSA PCR screening negative, so stoppedivvancomycin --Lactic acidosis improved with  IV fluids -Fever resolved -WBC is down to 10.0 from 11.2 -Patient's bilateral upper thigh/hip area Cellulitis improved significantly clinically -Hypoxia resolved -Okay to discharge home on Omnicef  2)Dehydration and hypokalemia/hypophosphatemia/hypomagnesemia ---resolved and improved with IV fluids, electrolytes normalized after replacement  3)multiple sclerosis/chronic pain syndrome--- continue home regimen including gabapentin and Percocets  4)DM2--A1c 6.0, reflecting excellent control PTA   5)Factor V Leiden mutation---continue Eliquis for VTE prevention  Disposition--- discharge home with caregiver  Disposition: The patient is from: Home  Anticipated d/c is to: Home with [name], patient's significant other and care giver.   Code Status :  Full code  Family Communication:  (patient is alert, awake and coherent)  -Discussed with Mr. [name], patient's significant other and care giver.  Consults  :  na  Discharge Condition: Stable,  Follow UP--- PCP as advised  Diet and Activity recommendation:  As advised  Discharge Instructions    Discharge Instructions    Call MD for:  difficulty breathing, headache or visual disturbances   Complete by: As directed    Call MD for:  persistant dizziness or light-headedness   Complete by: As directed    Call MD for:  persistant nausea and vomiting   Complete by: As directed    Call MD for:  redness, tenderness, or signs of infection (pain, swelling, redness, odor or green/yellow discharge around incision site)   Complete by: As directed    Call MD for:  temperature >100.4   Complete by: As directed    Diet - low sodium heart healthy   Complete by: As directed    Discharge instructions   Complete by: As directed    1)At this time you have refused home health visiting registered nurse, physical therapist and home health nurse aide--- if you change your mind and want to do services please contact the primary care physician to help set them up  2) you are taking Eliquis/apixaban which is your blood thinner so please Avoid ibuprofen/Advil/Aleve/Motrin/Goody Powders/Naproxen/BC powders/Meloxicam/Diclofenac/Indomethacin and other Nonsteroidal anti-inflammatory medications as these will make you more likely to bleed and can cause stomach ulcers, can also cause Kidney problems.   3) follow-up/recheck with the primary care physician in about a week advised   Increase activity slowly   Complete by: As directed        Discharge Medications     Allergies as of 08/29/2020      Reactions   Ambien [zolpidem Tartrate] Other (See Comments)   Crazy dreams   Ceftin [cefuroxime Axetil] Nausea And Vomiting   Pt tolerated Cefepime 09/2014 admission   Hctz [hydrochlorothiazide] Other (See  Comments)   Urinary retention   Lodine [etodolac] Hives   Promethazine Other (See Comments)   Hallucinations.    Codeine Rash   Latex Rash   Penicillins Rash      Medication List    STOP taking these medications   clobetasol 0.05 % external solution Commonly known as: TEMOVATE   hydrocortisone 2.5 % cream   nitrofurantoin (macrocrystal-monohydrate) 100 MG capsule Commonly known as: Macrobid   pimecrolimus 1 % cream Commonly known as: Elidel   senna 8.6 MG tablet Commonly known as: SENOKOT     TAKE these medications   Accu-Chek FastClix Lancets Misc USE TO TEST ONCE DAILY. What changed: See the new instructions.   albuterol 108 (90 Base) MCG/ACT inhaler Commonly  known as: ProAir HFA Inhale 2 puffs into the lungs every 6 (six) hours as needed. What changed:   how much to take  reasons to take this   CALCIUM 600 PO Take 2 tablets by mouth daily.   cefdinir 300 MG capsule Commonly known as: OMNICEF Take 1 capsule (300 mg total) by mouth 2 (two) times daily for 5 days. -Pt is Not allergic to [facility] taking on: August 30, 2020   Eliquis 5 MG Tabs tablet Generic drug: apixaban TAKE (1) TABLET BY MOUTH TWICE DAILY. What changed: See the new instructions.   gabapentin 300 MG capsule Commonly known as: NEURONTIN Take 1 capsule (300 mg total) by mouth 3 (three) times daily. What changed: Another medication with the same name was removed. Continue taking this medication, and follow the directions you see here.   glucose blood test strip Commonly known as: ONE TOUCH ULTRA TEST USE TO TEST ONCE DAILY. What changed:   how much to take  how to take this  when to take this  additional instructions   naloxone 4 MG/0.1ML Liqd nasal spray kit Commonly known as: NARCAN Use as directed What changed:   how much to take  how to take this  when to take this   oxyCODONE-acetaminophen 5-325 MG tablet Commonly known as: PERCOCET/ROXICET 1 every 4  hours as needed pain maximum 6/day What changed:   how much to take  how to take this  when to take this  reasons to take this  additional instructions  Another medication with the same name was removed. Continue taking this medication, and follow the directions you see here.   pantoprazole 40 MG tablet Commonly known as: PROTONIX TAKE 1 TABLET BY MOUTH ONCE DAILY.   potassium chloride 10 MEQ tablet Commonly known as: KLOR-CON TAKE 2 TABLETS BY MOUTH DAILY.   senna-docusate 8.6-50 MG tablet Commonly known as: Senokot-S Take 2 tablets by mouth at bedtime.   sucralfate 1 g tablet Commonly known as: Carafate 1 pill tid What changed:   how much to take  how to take this  when to take this  additional instructions   thiamine 100 MG tablet Commonly known as: Vitamin B-1 Take 1 tablet (100 mg total) by mouth daily. What changed:   medication strength  how much to take   traZODone 100 MG tablet Commonly known as: DESYREL TAKE 1 AND A HALF TABLET BY MOUTH AT BEDTIME. What changed: See the new instructions.   vitamin B-12 1000 MCG tablet Commonly known as: CYANOCOBALAMIN Take 1,000 mcg by mouth daily.      Major procedures and Radiology Reports - PLEASE review detailed and final reports for all details, in brief -    CT Chest W Contrast  Result Date: 08/27/2020 CLINICAL DATA:  Respiratory distress, multiple sclerosis, COPD, dyspnea EXAM: CT CHEST WITH CONTRAST TECHNIQUE: Multidetector CT imaging of the chest was performed during intravenous contrast administration. CONTRAST:  60m OMNIPAQUE IOHEXOL 300 MG/ML  SOLN COMPARISON:  None. FINDINGS: Cardiovascular: No significant coronary artery calcification. Global cardiac size within normal limits. No pericardial effusion. The central pulmonary arteries are enlarged in keeping with changes of pulmonary arterial hypertension. The thoracic aorta demonstrates mild atherosclerotic calcification, but is not dilated.  Mediastinum/Nodes: No enlarged mediastinal, hilar, or axillary lymph nodes. Thyroid gland, trachea, and esophagus demonstrate no significant findings. Lungs/Pleura: Discoid atelectasis within the left lung base. No superimposed confluent pulmonary infiltrate. No pneumothorax or pleural effusion. The central airways are widely patent. Upper Abdomen:  No acute abnormality. Musculoskeletal: No lytic or blastic bone lesions are identified. IMPRESSION: Mild left basilar atelectasis. No superimposed confluent pulmonary infiltrate. No acute intrathoracic pathology identified. Morphologic changes compatible with pulmonary arterial hypertension. Aortic Atherosclerosis (ICD10-I70.0). Electronically Signed   By: Fidela Salisbury MD   On: 08/27/2020 00:38   DG Chest Port 1 View  Result Date: 08/26/2020 CLINICAL DATA:  Questionable sepsis EXAM: PORTABLE CHEST 1 VIEW COMPARISON:  10/18/2014 FINDINGS: Heart is borderline in size. Left base opacity could reflect atelectasis or scarring. Right lung clear. No effusions. IMPRESSION: Chronic left basilar atelectasis or scarring. No active disease. Electronically Signed   By: Rolm Baptise M.D.   On: 08/26/2020 22:26    Micro Results   Recent Results (from the past 240 hour(s))  Blood Culture (routine x 2)     Status: None (Preliminary result)   Collection Time: 08/26/20  9:30 PM   Specimen: BLOOD RIGHT ARM  Result Value Ref Range Status   Specimen Description BLOOD RIGHT ARM  Final   Special Requests   Final    BOTTLES DRAWN AEROBIC AND ANAEROBIC Blood Culture results may not be optimal due to an inadequate volume of blood received in culture bottles   Culture   Final    NO GROWTH 3 DAYS Performed at [facility], [address], [place], [place] [address]    Report Status PENDING  Incomplete  Blood Culture (routine x 2)     Status: Abnormal (Preliminary result)   Collection Time: 08/26/20  9:30 PM   Specimen: BLOOD RIGHT ARM  Result Value Ref Range Status    Specimen Description   Final    BLOOD RIGHT ARM Performed at [facility] [address] [place], [place] [address]    Special Requests   Final    BOTTLES DRAWN AEROBIC AND ANAEROBIC Blood Culture adequate volume Performed at [facility], [address], [place], [place] [address]    Culture  Setup Time   Final    GRAM POSITIVE COCCI Gram Stain Report Called to,Read Back By and Verified With: DANIELS,J ON 08/27/20 AT 2115 BY LOY,C ANAEROBIC BOTTLE Performed at [place] ID to follow CRITICAL RESULT CALLED TO, READ BACK BY AND VERIFIED WITH: J DANIELS RN 08/28/20 0156 JDW GRAM POSITIVE RODS AEROBIC BOTTLE ONLY CRITICAL RESULT CALLED TO, READ BACK BY AND VERIFIED WITH: C. ROSE,RN 0121 08/29/2020 T. TYSOR    Culture (A)  Final    STAPHYLOCOCCUS HOMINIS STAPHYLOCOCCUS CAPITIS THE SIGNIFICANCE OF ISOLATING THIS ORGANISM FROM A SINGLE SET OF BLOOD CULTURES WHEN MULTIPLE SETS ARE DRAWN IS UNCERTAIN. PLEASE NOTIFY THE MICROBIOLOGY DEPARTMENT WITHIN ONE WEEK IF SPECIATION AND SENSITIVITIES ARE REQUIRED. Performed at [facility], [place] [address], [place], [place] [address]    Report Status PENDING  Incomplete  Blood Culture ID Panel (Reflexed)     Status: Abnormal   Collection Time: 08/26/20  9:30 PM  Result Value Ref Range Status   Enterococcus faecalis NOT DETECTED NOT DETECTED Final   Enterococcus Faecium NOT DETECTED NOT DETECTED Final   Listeria monocytogenes NOT DETECTED NOT DETECTED Final   Staphylococcus species DETECTED (A) NOT DETECTED Final    Comment: CRITICAL RESULT CALLED TO, READ BACK BY AND VERIFIED WITH: J DANIELS RN 08/28/20 0156 JDW    Staphylococcus aureus (BCID) NOT DETECTED NOT DETECTED Final   Staphylococcus epidermidis NOT DETECTED NOT DETECTED Final   Staphylococcus lugdunensis NOT DETECTED NOT DETECTED Final   Streptococcus species NOT DETECTED NOT DETECTED  Final   Streptococcus agalactiae NOT DETECTED NOT DETECTED Final    Streptococcus pneumoniae NOT DETECTED NOT DETECTED Final   Streptococcus pyogenes NOT DETECTED NOT DETECTED Final   A.calcoaceticus-baumannii NOT DETECTED NOT DETECTED Final   Bacteroides fragilis NOT DETECTED NOT DETECTED Final   Enterobacterales NOT DETECTED NOT DETECTED Final   Enterobacter cloacae complex NOT DETECTED NOT DETECTED Final   Escherichia coli NOT DETECTED NOT DETECTED Final   Klebsiella aerogenes NOT DETECTED NOT DETECTED Final   Klebsiella oxytoca NOT DETECTED NOT DETECTED Final   Klebsiella pneumoniae NOT DETECTED NOT DETECTED Final   Proteus species NOT DETECTED NOT DETECTED Final   Salmonella species NOT DETECTED NOT DETECTED Final   Serratia marcescens NOT DETECTED NOT DETECTED Final   Haemophilus influenzae NOT DETECTED NOT DETECTED Final   Neisseria meningitidis NOT DETECTED NOT DETECTED Final   Pseudomonas aeruginosa NOT DETECTED NOT DETECTED Final   Stenotrophomonas maltophilia NOT DETECTED NOT DETECTED Final   Candida albicans NOT DETECTED NOT DETECTED Final   Candida auris NOT DETECTED NOT DETECTED Final   Candida glabrata NOT DETECTED NOT DETECTED Final   Candida krusei NOT DETECTED NOT DETECTED Final   Candida parapsilosis NOT DETECTED NOT DETECTED Final   Candida tropicalis NOT DETECTED NOT DETECTED Final   Cryptococcus neoformans/gattii NOT DETECTED NOT DETECTED Final    Comment: Performed at [facility], [address], [place], [place] [address]  Resp Panel by RT-PCR (Flu A&B, Covid) Nasopharyngeal Swab     Status: None   Collection Time: 08/26/20  9:36 PM   Specimen: Nasopharyngeal Swab; Nasopharyngeal(NP) swabs in vial transport medium  Result Value Ref Range Status   SARS Coronavirus 2 by RT PCR NEGATIVE NEGATIVE Final    Comment: (NOTE) SARS-CoV-2 target nucleic acids are NOT DETECTED.  The SARS-CoV-2 RNA is generally detectable in upper respiratory specimens during the acute phase of infection. The lowest concentration of SARS-CoV-2  viral copies this assay can detect is 138 copies/mL. A negative result does not preclude SARS-Cov-2 infection and should not be used as the sole basis for treatment or other patient management decisions. A negative result may occur with  improper specimen collection/handling, submission of specimen other than nasopharyngeal swab, presence of viral mutation(s) within the areas targeted by this assay, and inadequate number of viral copies(<138 copies/mL). A negative result must be combined with clinical observations, patient history, and epidemiological information. The expected result is Negative.  Fact Sheet for Patients:  EntrepreneurPulse.com.au  Fact Sheet for Healthcare Providers:  IncredibleEmployment.be  This test is no t yet approved or cleared by the [place] FDA and  has been authorized for detection and/or diagnosis of SARS-CoV-2 by FDA under an Emergency Use Authorization (EUA). This EUA will remain  in effect (meaning this test can be used) for the duration of the COVID-19 declaration under Section 564(b)(1) of the Act, 21 U.S.C.section 360bbb-3(b)(1), unless the authorization is terminated  or revoked sooner.       Influenza A by PCR NEGATIVE NEGATIVE Final   Influenza B by PCR NEGATIVE NEGATIVE Final    Comment: (NOTE) The Xpert Xpress SARS-CoV-2/FLU/RSV plus assay is intended as an aid in the diagnosis of influenza from Nasopharyngeal swab specimens and should not be used as a sole basis for treatment. Nasal washings and aspirates are unacceptable for Xpert Xpress SARS-CoV-2/FLU/RSV testing.  Fact Sheet for Patients: EntrepreneurPulse.com.au  Fact Sheet for Healthcare Providers: IncredibleEmployment.be  This test is not yet approved or cleared by the [place] FDA  and has been authorized for detection and/or diagnosis of SARS-CoV-2 by FDA under an Emergency Use Authorization  (EUA). This EUA will remain in effect (meaning this test can be used) for the duration of the COVID-19 declaration under Section 564(b)(1) of the Act, 21 U.S.C. section 360bbb-3(b)(1), unless the authorization is terminated or revoked.  Performed at [facility], [address], [place], [place] [address]   Urine culture     Status: None   Collection Time: 08/26/20 10:54 PM   Specimen: In/Out Cath Urine  Result Value Ref Range Status   Specimen Description   Final    IN/OUT CATH URINE Performed at [facility], [address], [place], [place] [address]    Special Requests   Final    NONE Performed at [facility], [address], [place], [place] [address]    Culture   Final    NO GROWTH Performed at [facility], [place] [address] [place], [place] [address]    Report Status 08/28/2020 FINAL  Final  MRSA PCR Screening     Status: None   Collection Time: 08/27/20  6:45 AM   Specimen: Nasal Mucosa; Nasopharyngeal  Result Value Ref Range Status   MRSA by PCR NEGATIVE NEGATIVE Final    Comment:        The GeneXpert MRSA Assay (FDA approved for NASAL specimens only), is one component of a comprehensive MRSA colonization surveillance program. It is not intended to diagnose MRSA infection nor to guide or monitor treatment for MRSA infections. Performed at [facility], [address], [place], [place] [address]    Today   Subjective    Alithia Zavaleta today has no new complaints         No fever  Or chills  --Hypoxia resolved No Nausea, Vomiting or Diarrhea  -- Patient has been seen and examined prior to discharge   Objective   Blood pressure (!) 138/58, pulse 86, temperature 98.6 F (37 C), temperature source Oral, resp. rate 14, height 4' 9" (1.448 m), weight 88.7 kg, SpO2 (!) 87 %.   Intake/Output Summary (Last 24 hours) at 08/29/2020 1336 Last data filed at 08/29/2020 0300 Gross per 24 hour  Intake 100 ml  Output 800 ml  Net -700 ml    Exam Gen:-  Awake Alert,  Obese, chronically ill-appearing HEENT:- [place].AT, No sclera icterus Neck-Supple Neck,No JVD,.  Lungs-diminished in bases, no wheezing  CV- S1, S2 normal, regular  Abd-  +ve B.Sounds, Abd Soft, No tenderness,    Extremity/Skin:-+1 edema, pedal pulses present , overall much improved erythema, warmth and tenderness over bilateral upper thighs/lateral hips area--no streaking, no open draining wounds Psych-affect is appropriate, oriented x3 Neuro-generalized weakness, chronic neuromuscular deficits  which appears to be close to baseline, no additional new focal deficits, no tremors   Data Review   CBC w Diff:  Lab Results  Component Value Date   WBC 10.0 08/28/2020   HGB 13.8 08/28/2020   HGB 14.7 10/04/2018   HCT 43.3 08/28/2020   HCT 42.0 10/04/2018   PLT 150 08/28/2020   PLT 240 10/04/2018   LYMPHOPCT 7 08/26/2020   MONOPCT 4 08/26/2020   EOSPCT 0 08/26/2020   BASOPCT 1 08/26/2020    CMP:  Lab Results  Component Value Date   NA 140 08/28/2020   NA 141 10/04/2018   K 3.9 08/28/2020   CL 104 08/28/2020   CO2 28 08/28/2020   BUN 9 08/28/2020   BUN 8 10/04/2018  CREATININE 0.58 08/28/2020   CREATININE 0.80 12/08/2013   PROT 6.2 (L) 08/27/2020   PROT 6.4 10/04/2018   ALBUMIN 3.1 (L) 08/27/2020   ALBUMIN 3.9 10/04/2018   BILITOT 0.9 08/27/2020   BILITOT 0.4 10/04/2018   ALKPHOS 60 08/27/2020   AST 14 (L) 08/27/2020   ALT 12 08/27/2020  .   Total Discharge time is about 33 minutes  Roxan Hockey M.D on 08/29/2020 at 1:36 PM  Go to [email or web address] -  for contact info  [facility] - Office  [phone]

## 2020-08-30 LAB — CULTURE, BLOOD (ROUTINE X 2): Special Requests: ADEQUATE

## 2020-08-31 ENCOUNTER — Telehealth: Payer: Self-pay

## 2020-08-31 LAB — CULTURE, BLOOD (ROUTINE X 2): Culture: NO GROWTH

## 2020-09-07 NOTE — Telephone Encounter (Signed)
Error please close

## 2020-09-09 ENCOUNTER — Other Ambulatory Visit: Payer: Self-pay | Admitting: Family Medicine

## 2020-09-14 ENCOUNTER — Other Ambulatory Visit: Payer: Self-pay

## 2020-09-14 ENCOUNTER — Telehealth (INDEPENDENT_AMBULATORY_CARE_PROVIDER_SITE_OTHER): Payer: BC Managed Care – PPO | Admitting: Family Medicine

## 2020-09-14 ENCOUNTER — Telehealth: Payer: Self-pay | Admitting: *Deleted

## 2020-09-14 DIAGNOSIS — L0390 Cellulitis, unspecified: Secondary | ICD-10-CM

## 2020-09-14 NOTE — Telephone Encounter (Signed)
[name], mcgann are scheduled for a virtual visit with your provider today.    Just as we do with appointments in the office, we must obtain your consent to participate.  Your consent will be active for this visit and any virtual visit you may have with one of our providers in the next 365 days.    If you have a MyChart account, I can also send a copy of this consent to you electronically.  All virtual visits are billed to your insurance company just like a traditional visit in the office.  As this is a virtual visit, video technology does not allow for your provider to perform a traditional examination.  This may limit your provider's ability to fully assess your condition.  If your provider identifies any concerns that need to be evaluated in person or the need to arrange testing such as labs, EKG, etc, we will make arrangements to do so.    Although advances in technology are sophisticated, we cannot ensure that it will always work on either your end or our end.  If the connection with a video visit is poor, we may have to switch to a telephone visit.  With either a video or telephone visit, we are not always able to ensure that we have a secure connection.   I need to obtain your verbal consent now.   Are you willing to proceed with your visit today?   [name] has provided verbal consent on 09/14/2020 for a virtual visit (video or telephone).   Mitzie Na, RN 09/14/2020  2:53 PM

## 2020-09-14 NOTE — Progress Notes (Signed)
   Subjective:    Patient ID: [name], female    DOB: 1966/03/20, 54 y.o.   MRN: [id number] Felt bad 3 days  htn good   Diet fait HPI  Patient calls for a follow up on recent hospitalization for sepsis. Patient doing much better and at home. Hospitalization reviewed in detail Lab work as well as x-rays reviewed Med list reviewed Patient doing well taking her medicines Husband feels she overall is doing well They feel they are meeting all of her needs currently Virtual Visit via Video Note  I connected with [name] on 09/14/20 at  4:10 PM EST by a video enabled telemedicine application and verified that I am speaking with the correct person using two identifiers.  Location: Patient: home Provider: office   I discussed the limitations of evaluation and management by telemedicine and the availability of in person appointments. The patient expressed understanding and agreed to proceed.  History of Present Illness:    Observations/Objective:   Assessment and Plan:   Follow Up Instructions:    I discussed the assessment and treatment plan with the patient. The patient was provided an opportunity to ask questions and all were answered. The patient agreed with the plan and demonstrated an understanding of the instructions.   The patient was advised to call back or seek an in-person evaluation if the symptoms worsen or if the condition fails to improve as anticipated.  I provided 20 minutes of non-face-to-face time during this encounter.       Review of Systems  Constitutional: Negative for activity change, fatigue and fever.  HENT: Negative for congestion and rhinorrhea.   Respiratory: Negative for cough, chest tightness and shortness of breath.   Cardiovascular: Negative for chest pain and leg swelling.  Gastrointestinal: Negative for abdominal pain and nausea.  Skin: Negative for color change.  Neurological: Negative for dizziness and headaches.   Psychiatric/Behavioral: Negative for agitation and behavioral problems.      Please see above. Objective:   Physical Exam Vitals reviewed.  Constitutional:      General: She is not in acute distress. HENT:     Head: Normocephalic and atraumatic.  Eyes:     General:        Right eye: No discharge.        Left eye: No discharge.  Neck:     Trachea: No tracheal deviation.  Cardiovascular:     Rate and Rhythm: Normal rate and regular rhythm.     Heart sounds: Normal heart sounds. No murmur heard.   Pulmonary:     Effort: Pulmonary effort is normal. No respiratory distress.  Lymphadenopathy:     Cervical: No cervical adenopathy.  Skin:    General: Skin is warm and dry.  Neurological:     Mental Status: She is alert.     Coordination: Coordination normal.  Psychiatric:        Behavior: Behavior normal.      Today's visit was via telephone Physical exam was not possible for this visit  Assessment & Plan:  Cellulitis Resolved Chronic pain currently on her medications as directed We will do a in person home visit in early January Patient to notify us if any problems before then  1. Cellulitis, unspecified cellulitis site Finish out the antibiotics

## 2020-09-28 ENCOUNTER — Other Ambulatory Visit: Payer: Self-pay | Admitting: Family Medicine

## 2020-09-29 NOTE — Telephone Encounter (Signed)
Seen 09/14/20

## 2020-10-08 ENCOUNTER — Other Ambulatory Visit: Payer: Self-pay | Admitting: Family Medicine

## 2020-10-12 ENCOUNTER — Ambulatory Visit: Payer: BC Managed Care – PPO | Admitting: Family Medicine

## 2020-10-12 ENCOUNTER — Other Ambulatory Visit: Payer: Self-pay | Admitting: *Deleted

## 2020-10-12 NOTE — Telephone Encounter (Signed)
Pt scheduled for home visit today and dr scott requested to cancel and change to another day if pt was doing well and not having any problems. [name] states she is doing well and does not need anything at this time and ok to cancel. States they are delivering her pain med today and does not need a refill at this time. When would you like to reschedule home visit at?

## 2020-10-12 NOTE — Telephone Encounter (Signed)
Discussed with [name] and he verbalized understanding. Pt added to schedule for march 1st home visit at 4:30. [name] is aware and med pended and ready to sign.

## 2020-10-12 NOTE — Telephone Encounter (Signed)
Please talk with [name]  Due to outbreak of omicron in this region I would recommend her next visit to be the start of March unless she needs something sooner  We can send in refills of her pain medicine so that she does not run out  We can see her at the start of March  If this is okay with her and [name] please go ahead  please do a refill of her pain medicine that may be filled November 11, 2020 Please set up a home visit March 1 or 2 at 430

## 2020-10-13 ENCOUNTER — Other Ambulatory Visit: Payer: Self-pay | Admitting: Family Medicine

## 2020-10-15 MED ORDER — OXYCODONE-ACETAMINOPHEN 5-325 MG PO TABS: ORAL_TABLET | ORAL | 0 refills | Status: DC

## 2020-10-29 ENCOUNTER — Telehealth: Payer: Self-pay | Admitting: *Deleted

## 2020-10-29 NOTE — Telephone Encounter (Signed)
Fax from [place]. Insurance denied eliquis and prefers xarelto. Ok to change or try to get approval.

## 2020-10-29 NOTE — Telephone Encounter (Signed)
1.  Both of these medicines worked equally well for her situation 2.  It would be important to explain to her husband what is going on to keep him in the loop #3 to switch over to Xarelto simply may start Xarelto while at the same time stopping Eliquis. (In other words take your Eliquis today, then tomorrow start Xarelto) The dosage of Xarelto is 20 mg daily Xarelto, 20 mg, #30, 1 daily, 5 refills, stop Eliquis Obviously do not take both on the same day It is starting 1 and stopping the other This transition can occur when he finishes up the current prescription for Eliquis  Obviously if any objections or questions from [place] or [place] let us handle these issues

## 2020-10-29 NOTE — Telephone Encounter (Signed)
Husband states he has been working on this and called the drug company directly and they gave him a card to have the Eliquis only cost $10 out of pocket. He is going to pick up the medication today and if he has any problems he will call us back Monday to switch her to the other medication(Xarelto)

## 2020-10-29 NOTE — Telephone Encounter (Signed)
So noted.  I suppose for now stick with Eliquis until husband tells [place] differently

## 2020-11-28 ENCOUNTER — Telehealth: Payer: Self-pay | Admitting: Family Medicine

## 2020-11-28 NOTE — Telephone Encounter (Signed)
It should be noted that the patient's insurance will pay for Xarelto but not for Eliquis.  Previously we spoke with her husband regarding this issue he states that he is able to get her Eliquis through a [employer] incentive and therefore does not want to switch to Xarelto at this time.

## 2020-12-07 ENCOUNTER — Telehealth: Payer: Self-pay

## 2020-12-07 ENCOUNTER — Ambulatory Visit: Payer: BC Managed Care – PPO | Admitting: Family Medicine

## 2020-12-07 ENCOUNTER — Other Ambulatory Visit: Payer: Self-pay | Admitting: Family Medicine

## 2020-12-07 MED ORDER — OXYCODONE-ACETAMINOPHEN 5-325 MG PO TABS: ORAL_TABLET | ORAL | 0 refills | Status: DC

## 2020-12-07 NOTE — Telephone Encounter (Signed)
Pt contacted and verbalized understanding.  

## 2020-12-07 NOTE — Telephone Encounter (Signed)
t needs refill on oxyCODONE-acetaminophen (PERCOCET/ROXICET) [place], [place]   Pt call back [phone]

## 2020-12-07 NOTE — Telephone Encounter (Signed)
09/14/20 was last check up. Has upcoming appt on 3/3

## 2020-12-07 NOTE — Telephone Encounter (Signed)
FYI prescription was signed off on thank you

## 2020-12-09 ENCOUNTER — Telehealth (INDEPENDENT_AMBULATORY_CARE_PROVIDER_SITE_OTHER): Payer: BC Managed Care – PPO | Admitting: Family Medicine

## 2020-12-09 ENCOUNTER — Other Ambulatory Visit: Payer: Self-pay

## 2020-12-09 DIAGNOSIS — G8929 Other chronic pain: Secondary | ICD-10-CM

## 2020-12-09 DIAGNOSIS — R21 Rash and other nonspecific skin eruption: Secondary | ICD-10-CM | POA: Diagnosis not present

## 2020-12-09 NOTE — Progress Notes (Signed)
   Subjective:    Patient ID: [name], female    DOB: [birth year], 55 y.o.   MRN: [id number]  HPImed check up.  Has a red spot on her neck. Itches. Came up this week. Tried hydrocortisone cream. No relief.   This patient was seen today for chronic pain  The medication list was reviewed and updated.   -Compliance with medication: takes 6 a day  - Number patient states they take daily: 6   -when was the last dose patient took? today  The patient was advised the importance of maintaining medication and not using illegal substances with these.  Here for refills and follow up  The patient was educated that we can provide 3 monthly scripts for their medication, it is their responsibility to follow the instructions.  Side effects or complications from medications: none  Patient is aware that pain medications are meant to minimize the severity of the pain to allow their pain levels to improve to allow for better function. They are aware of that pain medications cannot totally remove their pain.  Due for UDT ( at least once per year) :   Scale of 1 to 10 ( 1 is least 10 is most) Your pain level without the medicine: 10 Your pain level with medication 7  Scale 1 to 10 ( 1-helps very little, 10 helps very well) How well does your pain medication reduce your pain so you can function better through out the day? 7  Virtual Visit via Video Note  I connected with Suzzanne Cloud on 12/09/20 at  3:50 PM EST by a video enabled telemedicine application and verified that I am speaking with the correct person using two identifiers.  Location: Patient: home Provider: office   I discussed the limitations of evaluation and management by telemedicine and the availability of in person appointments. The patient expressed understanding and agreed to proceed.  History of Present Illness:    Observations/Objective:   Assessment and Plan:   Follow Up Instructions:    I discussed the  assessment and treatment plan with the patient. The patient was provided an opportunity to ask questions and all were answered. The patient agreed with the plan and demonstrated an understanding of the instructions.   The patient was advised to call back or seek an in-person evaluation if the symptoms worsen or if the condition fails to improve as anticipated.  I provided 14 minutes of non-face-to-face time during this encounter.             Review of Systems     Objective:   Physical Exam   Today's visit was via telephone Physical exam was not possible for this visit      Assessment & Plan:  Due to sickness related reasons I was unable to do home visit  Pain medicine was sent in  We will do a in-home visit later this month

## 2020-12-10 ENCOUNTER — Telehealth: Payer: Self-pay

## 2020-12-10 NOTE — Telephone Encounter (Signed)
Rash on neck itching, had a phone visit yesterday with Dr. Nicki Reaper and was told he was going to call in something stronger than hydrocortisone.  Husband went to the pharmacy today but nothing there?  [employer]

## 2020-12-12 NOTE — Telephone Encounter (Signed)
Triamcinolone cream apply twice daily as needed, 30 g with 1 refill

## 2020-12-13 ENCOUNTER — Other Ambulatory Visit: Payer: Self-pay | Admitting: *Deleted

## 2020-12-13 MED ORDER — TRIAMCINOLONE ACETONIDE 0.1 % EX CREA: 1.0000 "application " | TOPICAL_CREAM | Freq: Two times a day (BID) | CUTANEOUS | 1 refills | Status: DC

## 2020-12-13 MED ORDER — TRIAMCINOLONE ACETONIDE 0.1 % EX CREA: TOPICAL_CREAM | CUTANEOUS | 1 refills | Status: DC

## 2020-12-13 NOTE — Telephone Encounter (Signed)
Mark aware cream sent to pharm.

## 2020-12-13 NOTE — Telephone Encounter (Signed)
Med sent to pharm. Left message to return call  

## 2021-01-06 ENCOUNTER — Other Ambulatory Visit: Payer: Self-pay | Admitting: Family Medicine

## 2021-01-07 ENCOUNTER — Other Ambulatory Visit: Payer: Self-pay

## 2021-01-07 ENCOUNTER — Other Ambulatory Visit: Payer: Self-pay | Admitting: Nurse Practitioner

## 2021-01-07 MED ORDER — OXYCODONE-ACETAMINOPHEN 5-325 MG PO TABS: ORAL_TABLET | ORAL | 0 refills | Status: DC

## 2021-01-07 NOTE — Telephone Encounter (Signed)
[name] pain meds run out today and no refill has been sent in and [name] is calling to check on this. oxyCODONE-acetaminophen (PERCOCET/ROXICET) 5-325 MG tablet [[id number]] [place], [place] - [place] Call back [phone]

## 2021-01-08 DIAGNOSIS — R61 Generalized hyperhidrosis: Secondary | ICD-10-CM | POA: Diagnosis not present

## 2021-01-08 DIAGNOSIS — R0902 Hypoxemia: Secondary | ICD-10-CM | POA: Diagnosis not present

## 2021-01-10 ENCOUNTER — Other Ambulatory Visit: Payer: Self-pay | Admitting: Family Medicine

## 2021-01-10 MED ORDER — OXYCODONE-ACETAMINOPHEN 5-325 MG PO TABS: ORAL_TABLET | ORAL | 0 refills | Status: DC

## 2021-01-10 NOTE — Progress Notes (Signed)
[name] was notified.

## 2021-01-10 NOTE — Progress Notes (Signed)
FYI-prescription for 180 tablets was sent in today, we would be doing her home visit tomorrow late afternoon

## 2021-01-11 ENCOUNTER — Ambulatory Visit: Payer: BC Managed Care – PPO | Admitting: Family Medicine

## 2021-01-11 ENCOUNTER — Other Ambulatory Visit: Payer: Self-pay

## 2021-01-11 DIAGNOSIS — G709 Myoneural disorder, unspecified: Secondary | ICD-10-CM | POA: Diagnosis not present

## 2021-01-11 DIAGNOSIS — G8929 Other chronic pain: Secondary | ICD-10-CM

## 2021-01-11 DIAGNOSIS — B372 Candidiasis of skin and nail: Secondary | ICD-10-CM | POA: Diagnosis not present

## 2021-01-11 DIAGNOSIS — E119 Type 2 diabetes mellitus without complications: Secondary | ICD-10-CM

## 2021-01-11 DIAGNOSIS — D6851 Activated protein C resistance: Secondary | ICD-10-CM | POA: Diagnosis not present

## 2021-01-11 DIAGNOSIS — I749 Embolism and thrombosis of unspecified artery: Secondary | ICD-10-CM | POA: Insufficient documentation

## 2021-01-11 DIAGNOSIS — F324 Major depressive disorder, single episode, in partial remission: Secondary | ICD-10-CM | POA: Diagnosis not present

## 2021-01-11 MED ORDER — OXYCODONE-ACETAMINOPHEN 5-325 MG PO TABS: ORAL_TABLET | ORAL | 0 refills | Status: DC

## 2021-01-11 MED ORDER — ELIQUIS 5 MG PO TABS
ORAL_TABLET | ORAL | 5 refills | Status: DC
Start: 2021-01-11 — End: 2021-08-05

## 2021-01-11 MED ORDER — GABAPENTIN 300 MG PO CAPS
ORAL_CAPSULE | ORAL | 5 refills | Status: DC
Start: 2021-01-11 — End: 2021-08-09

## 2021-01-11 MED ORDER — KETOCONAZOLE 2 % EX CREA: 1.0000 "application " | TOPICAL_CREAM | Freq: Two times a day (BID) | CUTANEOUS | 4 refills | Status: DC

## 2021-01-11 MED ORDER — PANTOPRAZOLE SODIUM 40 MG PO TBEC: 1.0000 | DELAYED_RELEASE_TABLET | Freq: Every day | ORAL | 5 refills | Status: DC

## 2021-01-11 MED ORDER — TRAZODONE HCL 100 MG PO TABS
ORAL_TABLET | ORAL | 5 refills | Status: DC
Start: 2021-01-11 — End: 2021-07-27

## 2021-01-11 NOTE — Progress Notes (Signed)
Subjective:    Patient ID: [name], female    DOB: 09-08-1966, 55 y.o.   MRN: [id number]  HPI Provider going for home visit with patient this evening.   Pt still having redness on neck. Has been using Triamcinolone cream. Now has a red area on left arm at elbow bend.  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  Sometimes she does feel down not suicidal She is bed ridden.  She is debilitated because of her neuromuscular condition.  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  Location of Pain for which the patient has been treated with regarding narcotics: Her pain is in her arms legs and back related to the neuropathy from her neuromuscular condition  Onset of this pain: She has had this for years and has been on medication for years.  She cannot be on anti-inflammatories because of her hypercoagulability and being on blood thinners   -Compliance with medication: Her husband make sure that she gets no more than 6/day drug registry is checked on a regular basis  - Number patient states they take daily: She takes 6/day  -when was the last dose patient took?  Earlier today  The patient was advised the importance of maintaining medication and not using illegal substances with these.  Here for refills and follow up  The patient was educated that we  can provide 3 monthly scripts for their medication, it is their responsibility to follow the instructions.  Side effects or complications from medications: She denies side effects.  Patient is aware that pain medications are meant to minimize the severity of the pain to allow their pain levels to improve to allow for better function. They are aware of that pain medications cannot totally remove their pain.  Due for UDT ( at least once per year) : It is difficult to do urine drug screen because of her being bedbound.  Scale of 1 to 10 ( 1 is least 10 is most) Your pain level without the medicine: 8 Your pain level with medication 3  Scale 1 to 10 ( 1-helps very little, 10 helps very well) How well does your pain medication reduce your pain so you can function better through out the day?  It reduces her pain to a level of approximately 3 that allows her to function better and her quality of life better otherwise she would be playing miserable.  Once again she is bedbound and debilitated  Quality of the pain: Burning throbbing pain all  Persistence of the pain: All the time  Modifying factors: Medicine helps positioning in her motorized bed helps       Review of Systems Denies any shortness of breath rectal bleeding chest pain    Objective:   Physical Exam Yeast dermatitis around the neck Lungs clear respiratory rate normal heart regular Extremities she has neuromuscular weakness of the arms and legs with some muscle  spasms related to this and tightening of the muscles       Assessment & Plan:  1. 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 Major depressive disorder with single episode, in partial remission ([place]) She is currently on trazodone.  Has some good days and some bad days we will continue current medication as is  3. Diabetes mellitus without complication ([place]) She will need  lab work we will be ordering lab work on her in the near future.  4. 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 Neuromuscular disorder ([place]) Progressive condition.  She is bedbound.  She has gained some weight recently need to be careful with portions and snack foods.  Blood pressure is good  6. Yeast dermatitis Ketoconazole recommended if ongoing trouble notify us  7. Encounter for chronic pain management The patient was seen in followup for chronic pain. A review over at their current pain status was discussed. Drug registry was checked. Prescriptions were given.  Regular follow-up recommended. Discussion was held regarding the importance of compliance with medication as well as pain medication contract.  Patient was informed that medication may cause drowsiness and should not be combined  with other medications/alcohol or street drugs. If the patient feels medication is causing altered alertness then do not drive or operate dangerous equipment.  We will consider doing a swab for drug screen on next visit

## 2021-01-12 ENCOUNTER — Telehealth: Payer: Self-pay

## 2021-01-12 NOTE — Telephone Encounter (Signed)
[name] come by said a new surgar meter and antibiotic was supposed to be sent to the pharmacy and I don't see one on file?  Mark call back [phone]

## 2021-01-12 NOTE — Telephone Encounter (Signed)
Prescription of Z-Pak for otitis please put otitis on the prescription Also glucometer and strips for prediabetes check once daily as needed

## 2021-01-13 ENCOUNTER — Ambulatory Visit: Payer: BC Managed Care – PPO | Admitting: Family Medicine

## 2021-01-13 MED ORDER — BLOOD GLUCOSE MONITOR KIT: PACK | 0 refills | Status: AC

## 2021-01-13 MED ORDER — AZITHROMYCIN 250 MG PO TABS: ORAL_TABLET | ORAL | 0 refills | Status: DC

## 2021-01-13 NOTE — Telephone Encounter (Signed)
Prescriptions sent electronically to pharmacy. Mark(DPR) notified.

## 2021-01-14 ENCOUNTER — Telehealth: Payer: Self-pay | Admitting: Family Medicine

## 2021-01-14 ENCOUNTER — Other Ambulatory Visit: Payer: Self-pay | Admitting: *Deleted

## 2021-01-14 DIAGNOSIS — E119 Type 2 diabetes mellitus without complications: Secondary | ICD-10-CM

## 2021-01-14 DIAGNOSIS — R5383 Other fatigue: Secondary | ICD-10-CM

## 2021-01-14 DIAGNOSIS — G709 Myoneural disorder, unspecified: Secondary | ICD-10-CM

## 2021-01-14 DIAGNOSIS — D6851 Activated protein C resistance: Secondary | ICD-10-CM

## 2021-01-14 MED ORDER — BLOOD GLUCOSE METER KIT: PACK | 0 refills | Status: AC

## 2021-01-14 NOTE — Addendum Note (Signed)
Addended by: Dairl Ponder on: 01/14/2021 11:50 AM   Modules accepted: Orders

## 2021-01-14 NOTE — Telephone Encounter (Signed)
Home heath referral ordered in EPIC. Covid vaccine mobile unit referral to contact patient concerning the covid booster. Mark(DPR) notified and verbalized understanding.

## 2021-01-14 NOTE — Telephone Encounter (Signed)
Nurses  Please assist This patient is homebound. She has neuromuscular condition and is bedbound. [facility] see her for visit regarding to her status as well as draw blood.  I doubt she would need any ongoing care it is just that she needs yearly blood work with her medications. (When talking with home health see what they would recommend to try to help this patient thank you)  #2 patient needs a booster on her COVID vaccine she received her initial Covid vaccine by the health department visiting her home giving it to her.  Is there any way that they can see her to do a booster shot at her home?

## 2021-01-20 ENCOUNTER — Telehealth: Payer: Self-pay

## 2021-01-20 DIAGNOSIS — Z8673 Personal history of transient ischemic attack (TIA), and cerebral infarction without residual deficits: Secondary | ICD-10-CM | POA: Diagnosis not present

## 2021-01-20 DIAGNOSIS — G549 Nerve root and plexus disorder, unspecified: Secondary | ICD-10-CM | POA: Diagnosis not present

## 2021-01-20 DIAGNOSIS — G6281 Critical illness polyneuropathy: Secondary | ICD-10-CM | POA: Diagnosis not present

## 2021-01-20 NOTE — Telephone Encounter (Signed)
I connected by phone with [name] and/or patient's caregiver on 01/20/2021 at 9:21 AM to discuss the potential vaccination through our Homebound vaccination initiative.   Prevaccination Checklist for COVID-19 Vaccines  1.  Are you feeling sick today? no  2.  Have you ever received a dose of a COVID-19 vaccine?  yes      If yes, which one? Pfizer   How many dose of Covid-19 vaccine have your received and dates ? 2, 01/28/2020, 02/20/2020   Check all that apply: I live in a long-term care setting. no  I have been diagnosed with a medical condition(s). Please list: _______________________ (pertinent to homebound status)  I am a first responder. no  I work in a long-term care facility, correctional facility, hospital, restaurant, retail setting, school, or other setting with high exposure to the public. no  4. Do you have a health condition or are you undergoing treatment that makes you moderately or severely immunocompromised? (This would include treatment for cancer or HIV, receipt of organ transplant, immunosuppressive therapy or high-dose corticosteroids, CAR-T-cell therapy, hematopoietic cell transplant [HCT], DiGeorge syndrome or Wiskott-Aldrich syndrome)  no  5. Have you received hematopoietic cell transplant (HCT) or CAR-T-cell therapies since receiving COVID-19 vaccine? no  6.  Have you ever had an allergic reaction: (This would include a severe reaction [ e.g., anaphylaxis] that required treatment with epinephrine or EpiPen or that caused you to go to the hospital.  It would also include an allergic reaction that occurred within 4 hours that caused hives, swelling, or respiratory distress, including wheezing.) A.  A previous dose of COVID-19 vaccine. no  B.  A vaccine or injectable therapy that contains multiple components, one of which is a COVID-19 vaccine component, but it is not known which component elicited the immediate reaction. no  C.  Are you allergic to polyethylene glycol?  no  D. Are you allergic to Polysorbate, which is found in some vaccines, film coated tablets and intravenous steroids?  no   7.  Have you ever had an allergic reaction to another vaccine (other than COVID-19 vaccine) or an injectable medication? (This would include a severe reaction [ e.g., anaphylaxis] that required treatment with epinephrine or EpiPen or that caused you to go to the hospital.  It would also include an allergic reaction that occurred within 4 hours that caused hives, swelling, or respiratory distress, including wheezing.)  no   8.  Have you ever had a severe allergic reaction (e.g., anaphylaxis) to something other than a component of the COVID-19 vaccine, or any vaccine or injectable medication?  This would include food, pet, venom, environmental, or oral medication allergies.  yes, PCN causes a rash  Check all that apply to you:  Am a female between ages 32 and 8 years old  no  Women 15 through 56 years of age can receive any FDA-authorized or -approved COVID-19 vaccine. However, they should be informed of the rare but increased risk of thrombosis with thrombocytopenia syndrome (TTS) after receipt of the [employer] Vaccine and the availability of other FDA-authorized and -approved COVID-19 vaccines. People who had TTS after a first dose of Janssen vaccine should not receive a subsequent dose of Janssen product    Am a female between ages 40 and 56 years old  no Males 5 through 56 years of age may receive the correct formulation of Pfizer-BioNTech COVID-19 vaccine. Males 18 and older can receive any FDA-authorized or -approved vaccine. However, people receiving an mRNA COVID-19 vaccine,  especially males 56 through 56 years of age and their parents/legal representative (when relevant), should be informed of the risk of developing myocarditis (an inflammation of the heart muscle) or pericarditis (inflammation of the lining around the heart) after receipt of an mRNA vaccine. The risk  of developing either myocarditis or pericarditis after vaccination is low, and lower than the risk of myocarditis associated with SARS-CoV-2 infection in adolescents and adults. Vaccine recipients should be counseled about the need to seek care if symptoms of myocarditis or pericarditis develop after vaccination     Have a history of myocarditis or pericarditis  no Myocarditis or pericarditis after receipt of the first dose of an mRNA COVID-19 vaccine series but before administration of the second dose  Experts advise that people who develop myocarditis or pericarditis after a dose of an mRNA COVID-19 vaccine not receive a subsequent dose of any COVID-19 vaccine, until additional safety data are available.  Administration of a subsequent dose of COVID-19 vaccine before safety data are available can be considered in certain circumstances after the episode of myocarditis or pericarditis has completely resolved. Until additional data are available, some experts recommend a Alphonsa Overall COVID-19 vaccine be considered instead of an mRNA COVID-19 vaccine. Decisions about proceeding with a subsequent dose should include a conversation between the patient, their parent/legal representative (when relevant), and their clinical team, which may include a cardiologist.    Have been treated with monoclonal antibodies or convalescent serum to prevent or treat COVID-19  no Vaccination should be offered to people regardless of history of prior symptomatic or asymptomatic SARS-CoV-2 infection. There is no recommended minimal interval between infection and vaccination.  However, vaccination should be deferred if a patient received monoclonal antibodies or convalescent serum as treatment for COVID-19 or for post-exposure prophylaxis. This is a precautionary measure until additional information becomes available, to avoid interference of the antibody treatment with vaccine-induced immune responses.  Defer COVID-19 vaccination for  30 days when a passive antibody product was used for post-exposure prophylaxis.  Defer COVID-19 vaccination for 90 days when a passive antibody product was used to treat COVID-19.     Diagnosed with Multisystem Inflammatory Syndrome (MIS-C or MIS-A) after a COVID-19 infection  no It is unknown if people with a history of MIS-C or MIS-A are at risk for a dysregulated immune response to COVID-19 vaccination.  People with a history of MIS-C or MIS-A may choose to be vaccinated. Considerations for vaccination may include:   Clinical recovery from MIS-C or MIS-A, including return to normal cardiac function   Personal risk of severe acute COVID-19 (e.g., age, underlying conditions)   High or substantial community transmission of SARS-CoV-2 and personal increased risk of reinfection.   Timing of any immunomodulatory therapies (general best practice guidelines for immunization can be consulted for more information [email or web address])   It has been 90 days or more since their diagnosis of MIS-C   Onset of MIS-C occurred before any COVID-19 vaccination   A conversation between the patient, their guardian(s), and their clinical team or a specialist may assist with COVID-19 vaccination decisions. Healthcare providers and health departments may also request a consultation from the [place] at [email or web address] [email or web address]/[email or web address].     Have a bleeding disorder  no Take a blood thinner  yes, Eliquis As with all vaccines, any COVID-19 vaccine product may be given to these patients, if a physician familiar with the patient's bleeding risk determines that the vaccine can be administered intramuscularly with reasonable safety.  ACIP recommends the following technique for intramuscular vaccination in patients with bleeding disorders or taking blood thinners: a fine-gauge needle (23-gauge or smaller  caliber) should be used for the vaccination, followed by firm pressure on the site, without rubbing, for at least 2 minutes.  People who regularly take aspirin or anticoagulants as part of their routine medications do not need to stop these medications prior to receipt of any COVID-19 vaccine.    Have a history of heparin-induced thrombocytopenia (HIT)  no Although the etiology of TTS associated with the Alphonsa Overall COVID-19 vaccine is unclear, it appears to be similar to another rare immune-mediated syndrome, heparin-induced thrombocytopenia (HIT). People with a history of an episode of an immune-mediated syndrome characterized by thrombosis and thrombocytopenia, such as HIT, should be offered a currently FDA-approved or FDA-authorized mRNA COVID-19 vaccine if it has been ?90 days since their TTS resolved. After 90 days, patients may be vaccinated with any currently FDA-approved or FDA-authorized COVID-19 vaccine, including Janssen COVID-19 Vaccine. However, people who developed TTS after their initial Alphonsa Overall vaccine should not receive a Janssen booster dose.  Experts believe the following factors do not make people more susceptible to TTS after receipt of the [employer]. People with these conditions can be vaccinated with any FDA-authorized or - approved COVID-19 vaccine, including the [employer] COVID-19 Vaccine:   A prior history of venous thromboembolism   Risk factors for venous thromboembolism (e.g., inherited or acquired thrombophilia including Factor V Leiden; prothrombin gene 20210A mutation; antiphospholipid syndrome; protein C, protein S or antithrombin deficiency   A prior history of other types of thromboses not associated with thrombocytopenia   Pregnancy, post-partum status, or receipt of hormonal contraceptives (e.g., combined oral contraceptives, patch, ring)   Additional recipient education materials can be found at http://gutierrez-robinson.com/  vaccines/safety/JJUpdate.html.    Am currently pregnant or breastfeeding  no Vaccination is recommended for all people aged 21 years and older, including people that are:   Pregnant   Breastfeeding   Trying to get pregnant now or who might become pregnant in the future   Pregnant, breastfeeding, and post-partum people 23 through 56 years of age should be aware of the rare risk of TTS after receipt of the Alphonsa Overall COVID-19 Vaccine and the availability of other FDA-authorized or -approved COVID-19 vaccines (i.e., mRNA vaccines).    Have received dermal fillers  yes, reports received Botox many years ago in her arm and hand FDA-authorized or -approved COVID-19 vaccines can be administered to people who have received injectable dermal fillers who have no contraindications for vaccination.  Infrequently, these people might experience temporary swelling at or near the site of filler injection (usually the face or lips) following administration of a dose of an mRNA COVID-19 vaccine. These people should be advised to contact their healthcare provider if swelling develops at or near the site of dermal filler following vaccination.     Have a history of Guillain-Barr Syndrome (GBS)  no People with a history of GBS can receive any FDA-authorized or -approved COVID-19 vaccine. However, given the possible association between the [employer] and an increased risk of GBS, a patient with a history of GBS and their clinical team should discuss the availability of mRNA vaccines to offer protection against COVID-19. The highest risk has been observed in men aged 70-64 years with symptoms of GBS beginning within 42 days after Alphonsa Overall COVID-19 vaccination.  People who had GBS after receiving Alphonsa Overall vaccine should be made aware of the option to receive an  mRNA COVID-19 vaccine booster at least 2 months (8 weeks) after the Janssen dose. However, Alphonsa Overall vaccine may be used as a booster, particularly if GBS  occurred more than 42 days after vaccination or was related to a non-vaccine factor. Prior to booster vaccination, a conversation between the patient and their clinical team may assist with decisions about use of a COVID-19 booster dose, including the timing of administration     Postvaccination Observation Times for People without Contraindications to Covid 19 Vaccination.  30 minutes:  People with a history of: A contraindication to another type of COVID-19 vaccine product (i.e., mRNA or viral vector COVID-19 vaccines)   Immediate (within 4 hours of exposure) non-severe allergic reaction to a COVID-19 vaccine or injectable therapies   Anaphylaxis due to any cause   Immediate allergic reaction of any severity to a non-COVID-19 vaccine   15 minutes: All other people  This patient is a 55 y.o. female that meets the FDA criteria to receive homebound vaccination. Patient or parent/caregiver understands they have the option to accept or refuse homebound vaccination.  Patient passed the pre-screening checklist and would like to proceed with homebound vaccination.  Based on questionnaire above, I recommend the patient be observed for 30 minutes.  There are an estimated #0 other household members/caregivers who are also interested in receiving the vaccine.    The patient has been confirmed homebound and eligible for homebound vaccination with the considerations outlined above. I will send the patient's information to our scheduling team who will reach out to schedule the patient and potential caregiver/family members for homebound vaccination.    [name] 01/20/2021 9:21 AM

## 2021-02-14 ENCOUNTER — Other Ambulatory Visit: Payer: Self-pay | Admitting: Family Medicine

## 2021-02-14 DIAGNOSIS — G709 Myoneural disorder, unspecified: Secondary | ICD-10-CM

## 2021-02-22 ENCOUNTER — Ambulatory Visit: Payer: BC Managed Care – PPO | Attending: Critical Care Medicine

## 2021-02-22 DIAGNOSIS — Z23 Encounter for immunization: Secondary | ICD-10-CM

## 2021-02-22 NOTE — Progress Notes (Signed)
   Covid-19 Vaccination Clinic  Name:  [name]    MRN: [id number] DOB: 1965/12/08  02/22/2021  [name] was observed post Covid-19 immunization for 15 minutes without incident. She was provided with Vaccine Information Sheet and instruction to access the V-Safe system.   [name] was instructed to call 911 with any severe reactions post vaccine: Marland Kitchen Difficulty breathing  . Swelling of face and throat  . A fast heartbeat  . A bad rash all over body  . Dizziness and weakness   Immunizations Administered    Name Date Dose VIS Date Route   PFIZER Comrnaty(Gray TOP) Covid-19 Vaccine 02/22/2021 10:45 AM 0.3 mL 09/16/2020 Intramuscular   Manufacturer: [employer]   Lot: [id number]   NDC: [phone]

## 2021-02-28 ENCOUNTER — Other Ambulatory Visit: Payer: Self-pay | Admitting: Family Medicine

## 2021-04-04 ENCOUNTER — Telehealth: Payer: BC Managed Care – PPO | Admitting: *Deleted

## 2021-04-04 NOTE — Telephone Encounter (Signed)
Home Visit

## 2021-04-06 ENCOUNTER — Other Ambulatory Visit: Payer: Self-pay | Admitting: Family Medicine

## 2021-04-06 NOTE — Telephone Encounter (Signed)
Front please schedule for home visit in July.  Choose a day where I am here preferably list this as a 4:30 PM visit thank you

## 2021-04-10 NOTE — Telephone Encounter (Signed)
July 14 please list this at 4:10 PM, let her husband know that it would be more likely between 430 and 5 Please explained to him our situation of being sure of provider and therefore I do not have many openings at all over the next several weeks If this does not suit with him I could do a phone visit at the end of the day this week and do a in person visit in August

## 2021-05-09 ENCOUNTER — Ambulatory Visit: Payer: BC Managed Care – PPO | Admitting: Family Medicine

## 2021-05-09 ENCOUNTER — Other Ambulatory Visit: Payer: Self-pay

## 2021-05-09 DIAGNOSIS — D6859 Other primary thrombophilia: Secondary | ICD-10-CM | POA: Diagnosis not present

## 2021-05-09 DIAGNOSIS — R531 Weakness: Secondary | ICD-10-CM

## 2021-05-09 DIAGNOSIS — D6851 Activated protein C resistance: Secondary | ICD-10-CM

## 2021-05-09 DIAGNOSIS — G894 Chronic pain syndrome: Secondary | ICD-10-CM | POA: Diagnosis not present

## 2021-05-09 DIAGNOSIS — G4709 Other insomnia: Secondary | ICD-10-CM | POA: Diagnosis not present

## 2021-05-09 DIAGNOSIS — G709 Myoneural disorder, unspecified: Secondary | ICD-10-CM

## 2021-05-09 MED ORDER — OXYCODONE-ACETAMINOPHEN 5-325 MG PO TABS: ORAL_TABLET | ORAL | 0 refills | Status: DC

## 2021-05-09 NOTE — Progress Notes (Signed)
   Subjective:    Patient ID: [name], female    DOB: 02-May-1966, 55 y.o.   MRN: [id number]  HPI Home visit Her significant other was present  This patient was seen today for chronic pain  The medication list was reviewed and updated.  Location of Pain for which the patient has been treated with regarding narcotics: Patient has burning pain and discomfort into her arms and legs related to nerve damage  Onset of this pain: She has had this pain over 10 years   -Compliance with medication: Overall good  - Number patient states they take daily: 5-6 daily  -when was the last dose patient took?  Earlier today  The patient was advised the importance of maintaining medication and not using illegal substances with these.  Here for refills and follow up  The patient was educated that we can provide 3 monthly scripts for their medication, it is their responsibility to follow the instructions.  Side effects or complications from medications: Denies side effects of medicine  Patient is aware that pain medications are meant to minimize the severity of the pain to allow their pain levels to improve to allow for better function. They are aware of that pain medications cannot totally remove their pain.  Due for UDT ( at least once per year) : We will do saliva urine drug screen on next visit  Scale of 1 to 10 ( 1 is least 10 is most) Your pain level without the medicine: 8 Your pain level with medication 3  Scale 1 to 10 ( 1-helps very little, 10 helps very well) How well does your pain medication reduce your pain so you can function better through out the day?  7 allows her to go through the day with less pain  Quality of the pain: Burning throbbing  Persistence of the pain: Present all the time  Modifying factors: Worse with activity        Review of Systems     Objective:   Physical Exam  General-in no acute distress Eyes-no discharge Lungs-respiratory rate normal,  CTA CV-no murmurs,RRR Extremities skin warm dry no edema Neuro grossly normal Behavior normal, alert       Assessment & Plan:  1. Neuromuscular disorder [facility]) She is seeing specialist for that she is bed ridden because of this.  Permanently disabled.  2. Primary hypercoagulable state ([place]) Is on Eliquis no bleeding issues.  No sign of any clots currently  3. Factor V Leiden mutation, heterozygous ([place]) Continue Eliquis  4. Chronic pain syndrome The patient was seen in followup for chronic pain. A review over at their current pain status was discussed. Drug registry was checked. Prescriptions were given.  Regular follow-up recommended. Discussion was held regarding the importance of compliance with medication as well as pain medication contract.  Patient was informed that medication may cause drowsiness and should not be combined  with other medications/alcohol or street drugs. If the patient feels medication is causing altered alertness then do not drive or operate dangerous equipment. Drug registry checked 3 scripts were sent in  5. Generalized weakness Due to the above neuromuscular disorder  6. Other insomnia Takes trazodone at nighttime Will do home visit in 3 months

## 2021-05-12 ENCOUNTER — Telehealth: Payer: Self-pay | Admitting: Family Medicine

## 2021-05-12 ENCOUNTER — Other Ambulatory Visit: Payer: Self-pay | Admitting: Family Medicine

## 2021-05-12 MED ORDER — HYDROXYZINE HCL 10 MG PO TABS: 10.0000 mg | ORAL_TABLET | Freq: Three times a day (TID) | ORAL | 1 refills | Status: DC | PRN

## 2021-05-12 NOTE — Telephone Encounter (Signed)
Prescription sent electronically to pharmacy. Mark (DPR) notified.

## 2021-05-12 NOTE — Progress Notes (Signed)
Hdroxyzine

## 2021-05-12 NOTE — Telephone Encounter (Signed)
Hydroxyzine 10 mg 1 taken every 8 hours as needed itching, #30, 1 refill

## 2021-05-12 NOTE — Telephone Encounter (Signed)
Patient states had a recent home visit and requested something for itching to be called into [place]

## 2021-05-26 ENCOUNTER — Other Ambulatory Visit: Payer: Self-pay | Admitting: Family Medicine

## 2021-06-02 ENCOUNTER — Other Ambulatory Visit: Payer: Self-pay | Admitting: Family Medicine

## 2021-06-14 ENCOUNTER — Telehealth: Payer: Self-pay | Admitting: Family Medicine

## 2021-06-14 MED ORDER — HYDROXYZINE PAMOATE 25 MG PO CAPS: ORAL_CAPSULE | ORAL | 3 refills | Status: DC

## 2021-06-14 NOTE — Telephone Encounter (Signed)
Hydroxyzine 25 mg sent to [facility] and pt significant other is aware

## 2021-06-14 NOTE — Telephone Encounter (Signed)
May change hydroxyzine New dose 25 mg 1 3 times daily as needed itching, #45, 3 refills caution drowsiness

## 2021-06-14 NOTE — Telephone Encounter (Signed)
Received all from [employer] (patient's significant other);called on patient's behalf to request either a higher dose of the current medication for itching or something different. Stated medication giving some relief but not resolving it completely.   Pharmacy verified as   [facility] [place] [address]  [place], [place] [address]  Phone:  [phone]  Fax:  [phone]   Please advise at [phone].

## 2021-07-04 ENCOUNTER — Other Ambulatory Visit: Payer: Self-pay | Admitting: Family Medicine

## 2021-07-26 ENCOUNTER — Other Ambulatory Visit: Payer: Self-pay | Admitting: Family Medicine

## 2021-08-05 ENCOUNTER — Other Ambulatory Visit: Payer: Self-pay | Admitting: Family Medicine

## 2021-08-05 MED ORDER — OXYCODONE-ACETAMINOPHEN 5-325 MG PO TABS: ORAL_TABLET | ORAL | 0 refills | Status: DC

## 2021-08-05 NOTE — Telephone Encounter (Signed)
Patient is requesting refill on oxycodone 5/325 called into  [facility]

## 2021-08-06 ENCOUNTER — Other Ambulatory Visit: Payer: Self-pay | Admitting: Family Medicine

## 2021-08-08 ENCOUNTER — Telehealth: Payer: Self-pay | Admitting: Family Medicine

## 2021-08-08 NOTE — Telephone Encounter (Signed)
Good morning,   I just want to let you know i started [name] a GoFundMe  account  to get some of the things she needs so if there is something you know that can help it will be greatly appreciated the name on  the profile is ms. [name] thanks and have a great day                      Mark   A lift to get her in the  car  and a handicap shower and we need to get her appointment for dr Nicki Reaper to come out and give her a flu shot

## 2021-08-14 NOTE — Telephone Encounter (Signed)
Please set up as a 430 slot for a home visit, you may utilize a 410 or a 430 slot for either this week or next week with what ever works for family, not on a Friday please

## 2021-08-15 ENCOUNTER — Other Ambulatory Visit: Payer: Self-pay | Admitting: Family Medicine

## 2021-08-23 ENCOUNTER — Ambulatory Visit: Payer: BC Managed Care – PPO | Admitting: Family Medicine

## 2021-08-23 ENCOUNTER — Other Ambulatory Visit: Payer: Self-pay

## 2021-08-23 DIAGNOSIS — D6851 Activated protein C resistance: Secondary | ICD-10-CM | POA: Diagnosis not present

## 2021-08-23 DIAGNOSIS — G709 Myoneural disorder, unspecified: Secondary | ICD-10-CM | POA: Diagnosis not present

## 2021-08-23 DIAGNOSIS — F3340 Major depressive disorder, recurrent, in remission, unspecified: Secondary | ICD-10-CM

## 2021-08-23 DIAGNOSIS — D6859 Other primary thrombophilia: Secondary | ICD-10-CM

## 2021-08-23 MED ORDER — POTASSIUM CHLORIDE ER 10 MEQ PO TBCR
20.0000 meq | EXTENDED_RELEASE_TABLET | Freq: Every day | ORAL | 6 refills | Status: DC
Start: 2021-08-23 — End: 2021-10-27

## 2021-08-23 MED ORDER — APIXABAN 5 MG PO TABS
ORAL_TABLET | ORAL | 6 refills | Status: DC
Start: 2021-08-23 — End: 2022-03-07

## 2021-08-23 NOTE — Progress Notes (Signed)
Subjective:    Patient ID: Jaelin Devincentis, female    DOB: 25-Jun-1966, 55 y.o.   MRN: [id number]  HPI Pt needing follow up. Requesting flu shot. Taking 6 Oxycodone per day.  Home visit Patient stable She is bed ridden with neuromuscular disease Has neuromuscular pain On gabapentin as well as oxycodone She states it does not cause drowsiness She is due for flu shot today  This patient was seen today for chronic pain  The medication list was reviewed and updated.  Location of Pain for which the patient has been treated with regarding narcotics: Describes burning pain in the arms and legs this is been present for years  Onset of this pain: This came on with her neuromuscular disorder many years ago   -Compliance with medication: She relates compliance with medicine  - Number patient states they take daily: Typically takes 5-6 of the 5 mg tablets daily  -when was the last dose patient took?  Earlier today  The patient was advised the importance of maintaining medication and not using illegal substances with these.  Here for refills and follow up  The patient was educated that we can provide 3 monthly scripts for their medication, it is their responsibility to follow the instructions.  Side effects or complications from medications: She denies side effects  Patient is aware that pain medications are meant to minimize the severity of the pain to allow their pain levels to improve to allow for better function. They are aware of that pain medications cannot totally remove their pain.  Due for UDT ( at least once per year) : Due on next visit to the office  Scale of 1 to 10 ( 1 is least 10 is most) Your pain level without the medicine:8 Your pain level with medication 3  Scale 1 to 10 ( 1-helps very little, 10 helps very well) How well does your pain medication reduce your pain so you can function better through out the day?  7  Quality of the pain: Burning aching pain more in  the legs than the arms  Persistence of the pain: Present 24/7  Modifying factors: Nothing really seems to make it better or worse medication does make it tolerable  She states her moods are doing okay currently    Review of Systems     Objective:   Physical Exam  General-in no acute distress Eyes-no discharge Lungs-respiratory rate normal, CTA CV-no murmurs,RRR Extremities skin warm dry no edema Neuro grossly normal Behavior normal, alert       Assessment & Plan:  1. Neuromuscular disorder ([place]) Patient has weakness in both arms both legs worse in the legs with some contractures in the legs she is bed ridden  2. Primary hypercoagulable state ([place]) On Eliquis not having any bleeding issues currently  3. Factor V Leiden mutation, heterozygous ([place]) See above  4. Recurrent major depression in remission ([place]) In remission currently  The patient was seen in followup for chronic pain. A review over at their current pain status was discussed. Drug registry was checked. Prescriptions were given.  Regular follow-up recommended. Discussion was held regarding the importance of compliance with medication as well as pain medication contract.  Patient was informed that medication may cause drowsiness and should not be combined  with other medications/alcohol or street drugs. If the patient feels medication is causing altered alertness then do not drive or operate dangerous equipment.  Flu shot was given to the patient without difficulty left arm after  receiving her permission  We will look into social worker trying to help [place] figure out how we can get labs on her

## 2021-08-27 MED ORDER — OXYCODONE-ACETAMINOPHEN 5-325 MG PO TABS: ORAL_TABLET | ORAL | 0 refills | Status: DC

## 2021-09-02 ENCOUNTER — Other Ambulatory Visit: Payer: Self-pay | Admitting: Family Medicine

## 2021-10-04 ENCOUNTER — Telehealth: Payer: Self-pay | Admitting: Family Medicine

## 2021-10-04 NOTE — Telephone Encounter (Signed)
My chart message sent to pt significant other

## 2021-10-04 NOTE — Telephone Encounter (Signed)
1.  We will need to know how long the patient has been having sinus symptoms?  As it have any days  #2 it is imperative that [name] does a home COVID test and tells [place] the result

## 2021-10-04 NOTE — Telephone Encounter (Signed)
Pt partner Mark sent my chart message:  Good morning I need to see if you can send [name] in something for a sinus infection she been all stopped up and a little hard to breathe. And blowing her nose alot    Thanks   Good morning Mark. Is [name] having any trouble breathing or shortness of breath? Is she running any fevers? When did her symptoms begin? Any Covid testing recently?   No shortage of breathe no fever no covit test  Please advise. Thank you

## 2021-10-05 MED ORDER — AZITHROMYCIN 250 MG PO TABS: ORAL_TABLET | ORAL | 0 refills | Status: DC

## 2021-10-05 NOTE — Telephone Encounter (Signed)
My chart read by spouse per [facility]

## 2021-10-05 NOTE — Telephone Encounter (Signed)
It is possible that this could be just a bad virus and could take several days to run its course if family does not feel comfortable waiting may prescribe May do Z-Pak Put under the diagnosis as a note to pharmacy for sinus infection

## 2021-10-05 NOTE — Telephone Encounter (Signed)
Spoke with patient. Pt states COVID test was negative yesterday. Sinus symptoms have been going on for about 3 days. Pt having runny nose, cough and headache. Please advise. Thank you

## 2021-10-05 NOTE — Addendum Note (Signed)
Addended by: Vicente Males on: 10/05/2021 02:41 PM   Modules accepted: Orders

## 2021-10-05 NOTE — Telephone Encounter (Signed)
Z-Pak sent to pharmacy. Pt verbalized understanding.

## 2021-10-25 ENCOUNTER — Emergency Department (HOSPITAL_COMMUNITY): Payer: BC Managed Care – PPO

## 2021-10-25 ENCOUNTER — Inpatient Hospital Stay (HOSPITAL_COMMUNITY)
Admission: EM | Admit: 2021-10-25 | Discharge: 2021-10-27 | DRG: 193 | Disposition: A | Discharge status: Home / Self Care | Payer: BC Managed Care – PPO | Attending: Family Medicine | Admitting: Family Medicine

## 2021-10-25 ENCOUNTER — Other Ambulatory Visit: Payer: Self-pay

## 2021-10-25 ENCOUNTER — Encounter (HOSPITAL_COMMUNITY): Payer: Self-pay

## 2021-10-25 DIAGNOSIS — R739 Hyperglycemia, unspecified: Secondary | ICD-10-CM | POA: Diagnosis not present

## 2021-10-25 DIAGNOSIS — Z888 Allergy status to other drugs, medicaments and biological substances status: Secondary | ICD-10-CM

## 2021-10-25 DIAGNOSIS — Z8249 Family history of ischemic heart disease and other diseases of the circulatory system: Secondary | ICD-10-CM | POA: Diagnosis not present

## 2021-10-25 DIAGNOSIS — Z8673 Personal history of transient ischemic attack (TIA), and cerebral infarction without residual deficits: Secondary | ICD-10-CM

## 2021-10-25 DIAGNOSIS — J189 Pneumonia, unspecified organism: Secondary | ICD-10-CM | POA: Diagnosis not present

## 2021-10-25 DIAGNOSIS — Z9841 Cataract extraction status, right eye: Secondary | ICD-10-CM

## 2021-10-25 DIAGNOSIS — M797 Fibromyalgia: Secondary | ICD-10-CM | POA: Diagnosis present

## 2021-10-25 DIAGNOSIS — D539 Nutritional anemia, unspecified: Secondary | ICD-10-CM | POA: Diagnosis present

## 2021-10-25 DIAGNOSIS — G35 Multiple sclerosis: Secondary | ICD-10-CM | POA: Diagnosis not present

## 2021-10-25 DIAGNOSIS — Z87891 Personal history of nicotine dependence: Secondary | ICD-10-CM

## 2021-10-25 DIAGNOSIS — Z885 Allergy status to narcotic agent status: Secondary | ICD-10-CM

## 2021-10-25 DIAGNOSIS — J9601 Acute respiratory failure with hypoxia: Secondary | ICD-10-CM | POA: Diagnosis not present

## 2021-10-25 DIAGNOSIS — I10 Essential (primary) hypertension: Secondary | ICD-10-CM | POA: Diagnosis present

## 2021-10-25 DIAGNOSIS — J9811 Atelectasis: Secondary | ICD-10-CM | POA: Diagnosis not present

## 2021-10-25 DIAGNOSIS — J90 Pleural effusion, not elsewhere classified: Secondary | ICD-10-CM | POA: Diagnosis not present

## 2021-10-25 DIAGNOSIS — J440 Chronic obstructive pulmonary disease with acute lower respiratory infection: Secondary | ICD-10-CM | POA: Diagnosis present

## 2021-10-25 DIAGNOSIS — G8929 Other chronic pain: Secondary | ICD-10-CM | POA: Insufficient documentation

## 2021-10-25 DIAGNOSIS — Z7401 Bed confinement status: Secondary | ICD-10-CM | POA: Diagnosis not present

## 2021-10-25 DIAGNOSIS — Z79899 Other long term (current) drug therapy: Secondary | ICD-10-CM

## 2021-10-25 DIAGNOSIS — E669 Obesity, unspecified: Secondary | ICD-10-CM

## 2021-10-25 DIAGNOSIS — Z880 Allergy status to penicillin: Secondary | ICD-10-CM

## 2021-10-25 DIAGNOSIS — J9602 Acute respiratory failure with hypercapnia: Secondary | ICD-10-CM | POA: Diagnosis not present

## 2021-10-25 DIAGNOSIS — R5381 Other malaise: Secondary | ICD-10-CM | POA: Diagnosis not present

## 2021-10-25 DIAGNOSIS — Z20822 Contact with and (suspected) exposure to covid-19: Secondary | ICD-10-CM | POA: Diagnosis not present

## 2021-10-25 DIAGNOSIS — E46 Unspecified protein-calorie malnutrition: Secondary | ICD-10-CM

## 2021-10-25 DIAGNOSIS — E8809 Other disorders of plasma-protein metabolism, not elsewhere classified: Secondary | ICD-10-CM | POA: Diagnosis not present

## 2021-10-25 DIAGNOSIS — J9622 Acute and chronic respiratory failure with hypercapnia: Secondary | ICD-10-CM | POA: Diagnosis present

## 2021-10-25 DIAGNOSIS — Z7901 Long term (current) use of anticoagulants: Secondary | ICD-10-CM

## 2021-10-25 DIAGNOSIS — R7303 Prediabetes: Secondary | ICD-10-CM | POA: Diagnosis present

## 2021-10-25 DIAGNOSIS — E785 Hyperlipidemia, unspecified: Secondary | ICD-10-CM | POA: Diagnosis present

## 2021-10-25 DIAGNOSIS — F1120 Opioid dependence, uncomplicated: Secondary | ICD-10-CM | POA: Diagnosis not present

## 2021-10-25 DIAGNOSIS — I700 Atherosclerosis of aorta: Secondary | ICD-10-CM | POA: Diagnosis not present

## 2021-10-25 DIAGNOSIS — D6851 Activated protein C resistance: Secondary | ICD-10-CM | POA: Diagnosis present

## 2021-10-25 DIAGNOSIS — Z83438 Family history of other disorder of lipoprotein metabolism and other lipidemia: Secondary | ICD-10-CM

## 2021-10-25 DIAGNOSIS — K219 Gastro-esophageal reflux disease without esophagitis: Secondary | ICD-10-CM | POA: Diagnosis present

## 2021-10-25 DIAGNOSIS — G894 Chronic pain syndrome: Secondary | ICD-10-CM | POA: Diagnosis present

## 2021-10-25 DIAGNOSIS — Z9842 Cataract extraction status, left eye: Secondary | ICD-10-CM

## 2021-10-25 DIAGNOSIS — R9431 Abnormal electrocardiogram [ECG] [EKG]: Secondary | ICD-10-CM | POA: Diagnosis not present

## 2021-10-25 DIAGNOSIS — Z743 Need for continuous supervision: Secondary | ICD-10-CM | POA: Diagnosis not present

## 2021-10-25 DIAGNOSIS — J9621 Acute and chronic respiratory failure with hypoxia: Secondary | ICD-10-CM | POA: Diagnosis not present

## 2021-10-25 DIAGNOSIS — R531 Weakness: Secondary | ICD-10-CM | POA: Diagnosis not present

## 2021-10-25 DIAGNOSIS — Z981 Arthrodesis status: Secondary | ICD-10-CM

## 2021-10-25 DIAGNOSIS — E872 Acidosis, unspecified: Secondary | ICD-10-CM | POA: Diagnosis not present

## 2021-10-25 DIAGNOSIS — R0902 Hypoxemia: Secondary | ICD-10-CM | POA: Diagnosis not present

## 2021-10-25 DIAGNOSIS — A419 Sepsis, unspecified organism: Secondary | ICD-10-CM | POA: Diagnosis not present

## 2021-10-25 DIAGNOSIS — Z961 Presence of intraocular lens: Secondary | ICD-10-CM | POA: Diagnosis present

## 2021-10-25 DIAGNOSIS — R69 Illness, unspecified: Secondary | ICD-10-CM | POA: Diagnosis not present

## 2021-10-25 DIAGNOSIS — M542 Cervicalgia: Secondary | ICD-10-CM | POA: Insufficient documentation

## 2021-10-25 DIAGNOSIS — R279 Unspecified lack of coordination: Secondary | ICD-10-CM | POA: Diagnosis not present

## 2021-10-25 DIAGNOSIS — R509 Fever, unspecified: Secondary | ICD-10-CM

## 2021-10-25 DIAGNOSIS — Z833 Family history of diabetes mellitus: Secondary | ICD-10-CM

## 2021-10-25 DIAGNOSIS — G629 Polyneuropathy, unspecified: Secondary | ICD-10-CM | POA: Diagnosis present

## 2021-10-25 DIAGNOSIS — Z6835 Body mass index (BMI) 35.0-35.9, adult: Secondary | ICD-10-CM | POA: Diagnosis not present

## 2021-10-25 DIAGNOSIS — I517 Cardiomegaly: Secondary | ICD-10-CM | POA: Diagnosis not present

## 2021-10-25 DIAGNOSIS — Z91040 Latex allergy status: Secondary | ICD-10-CM

## 2021-10-25 DIAGNOSIS — J449 Chronic obstructive pulmonary disease, unspecified: Secondary | ICD-10-CM | POA: Diagnosis not present

## 2021-10-25 DIAGNOSIS — R000 Tachycardia, unspecified: Secondary | ICD-10-CM | POA: Diagnosis not present

## 2021-10-25 LAB — MAGNESIUM: Magnesium: 1.8 mg/dL (ref 1.7–2.4)

## 2021-10-25 LAB — CBC WITH DIFFERENTIAL/PLATELET
Abs Immature Granulocytes: 0.03 10*3/uL (ref 0.00–0.07)
Basophils Absolute: 0 10*3/uL (ref 0.0–0.1)
Basophils Relative: 0 %
Eosinophils Absolute: 0.1 10*3/uL (ref 0.0–0.5)
Eosinophils Relative: 2 %
HCT: 47.9 % — ABNORMAL HIGH (ref 36.0–46.0)
Hemoglobin: 15.3 g/dL — ABNORMAL HIGH (ref 12.0–15.0)
Immature Granulocytes: 1 %
Lymphocytes Relative: 14 %
Lymphs Abs: 0.8 10*3/uL (ref 0.7–4.0)
MCH: 37.2 pg — ABNORMAL HIGH (ref 26.0–34.0)
MCHC: 31.9 g/dL (ref 30.0–36.0)
MCV: 116.5 fL — ABNORMAL HIGH (ref 80.0–100.0)
Monocytes Absolute: 0.3 10*3/uL (ref 0.1–1.0)
Monocytes Relative: 6 %
Neutro Abs: 4.3 10*3/uL (ref 1.7–7.7)
Neutrophils Relative %: 77 %
Platelets: 159 10*3/uL (ref 150–400)
RBC: 4.11 MIL/uL (ref 3.87–5.11)
RDW: 13.9 % (ref 11.5–15.5)
WBC: 5.6 10*3/uL (ref 4.0–10.5)
nRBC: 0 % (ref 0.0–0.2)

## 2021-10-25 LAB — BLOOD GAS, ARTERIAL
Acid-Base Excess: 3.5 mmol/L — ABNORMAL HIGH (ref 0.0–2.0)
Bicarbonate: 26.7 mmol/L (ref 20.0–28.0)
Drawn by: 41977
FIO2: 50
O2 Saturation: 98.4 %
Patient temperature: 37
pCO2 arterial: 51.7 mmHg — ABNORMAL HIGH (ref 32.0–48.0)
pH, Arterial: 7.36 (ref 7.350–7.450)
pO2, Arterial: 142 mmHg — ABNORMAL HIGH (ref 83.0–108.0)

## 2021-10-25 LAB — URINALYSIS, ROUTINE W REFLEX MICROSCOPIC
Bilirubin Urine: NEGATIVE
Glucose, UA: NEGATIVE mg/dL
Ketones, ur: NEGATIVE mg/dL
Leukocytes,Ua: NEGATIVE
Nitrite: NEGATIVE
Specific Gravity, Urine: >1.03 — ABNORMAL HIGH (ref 1.005–1.030)
pH: 5 (ref 5.0–8.0)

## 2021-10-25 LAB — COMPREHENSIVE METABOLIC PANEL
ALT: 13 U/L (ref 0–44)
AST: 23 U/L (ref 15–41)
Albumin: 2.7 g/dL — ABNORMAL LOW (ref 3.5–5.0)
Alkaline Phosphatase: 66 U/L (ref 38–126)
Anion gap: 7 (ref 5–15)
BUN: 11 mg/dL (ref 6–20)
CO2: 32 mmol/L (ref 22–32)
Calcium: 8.3 mg/dL — ABNORMAL LOW (ref 8.9–10.3)
Chloride: 99 mmol/L (ref 98–111)
Creatinine, Ser: 0.88 mg/dL (ref 0.44–1.00)
GFR, Estimated: >60 mL/min (ref >60)
Glucose, Bld: 164 mg/dL — ABNORMAL HIGH (ref 70–99)
Potassium: 4.3 mmol/L (ref 3.5–5.1)
Sodium: 138 mmol/L (ref 135–145)
Total Bilirubin: 0.9 mg/dL (ref 0.3–1.2)
Total Protein: 5.9 g/dL — ABNORMAL LOW (ref 6.5–8.1)

## 2021-10-25 LAB — RESP PANEL BY RT-PCR (FLU A&B, COVID) ARPGX2
Influenza A by PCR: NEGATIVE
Influenza B by PCR: NEGATIVE
SARS Coronavirus 2 by RT PCR: NEGATIVE

## 2021-10-25 LAB — BRAIN NATRIURETIC PEPTIDE: B Natriuretic Peptide: 66 pg/mL (ref 0.0–100.0)

## 2021-10-25 LAB — BLOOD GAS, VENOUS
Acid-Base Excess: 7.7 mmol/L — ABNORMAL HIGH (ref 0.0–2.0)
Bicarbonate: 28.2 mmol/L — ABNORMAL HIGH (ref 20.0–28.0)
FIO2: 28
O2 Saturation: 68.6 %
Patient temperature: 37.9
pCO2, Ven: 72.3 mmHg (ref 44.0–60.0)
pH, Ven: 7.298 (ref 7.250–7.430)
pO2, Ven: 46.3 mmHg — ABNORMAL HIGH (ref 32.0–45.0)

## 2021-10-25 LAB — LIPASE, BLOOD: Lipase: 34 U/L (ref 11–51)

## 2021-10-25 LAB — LACTIC ACID, PLASMA
Lactic Acid, Venous: 2.1 mmol/L (ref 0.5–1.9)
Lactic Acid, Venous: 3 mmol/L (ref 0.5–1.9)

## 2021-10-25 LAB — TSH: TSH: 0.764 u[IU]/mL (ref 0.350–4.500)

## 2021-10-25 LAB — PROTIME-INR
INR: 1.4 — ABNORMAL HIGH (ref 0.8–1.2)
Prothrombin Time: 17 seconds — ABNORMAL HIGH (ref 11.4–15.2)

## 2021-10-25 LAB — URINALYSIS, MICROSCOPIC (REFLEX)

## 2021-10-25 LAB — TROPONIN I (HIGH SENSITIVITY): Troponin I (High Sensitivity): 6 ng/L (ref <18)

## 2021-10-25 MED ORDER — SODIUM CHLORIDE 0.9 % IV SOLN
2.0000 g | Freq: Once | INTRAVENOUS | Status: AC
  Administered 2021-10-25: 2 g via INTRAVENOUS
  Filled 2021-10-25: qty 2

## 2021-10-25 MED ORDER — LACTATED RINGERS IV SOLN: INTRAVENOUS | Status: DC

## 2021-10-25 MED ORDER — IOHEXOL 350 MG/ML SOLN
100.0000 mL | Freq: Once | INTRAVENOUS | Status: AC | PRN
  Administered 2021-10-25: 100 mL via INTRAVENOUS

## 2021-10-25 MED ORDER — METRONIDAZOLE 500 MG/100ML IV SOLN
500.0000 mg | Freq: Once | INTRAVENOUS | Status: AC
  Administered 2021-10-25: 500 mg via INTRAVENOUS
  Filled 2021-10-25: qty 100

## 2021-10-25 MED ORDER — LACTATED RINGERS IV BOLUS (SEPSIS)
1000.0000 mL | Freq: Once | INTRAVENOUS | Status: AC
  Administered 2021-10-25: 1000 mL via INTRAVENOUS

## 2021-10-25 MED ORDER — VANCOMYCIN HCL IN DEXTROSE 1-5 GM/200ML-% IV SOLN: 1000.0000 mg | Freq: Once | INTRAVENOUS | Status: DC

## 2021-10-25 MED ORDER — VANCOMYCIN HCL IN DEXTROSE 1-5 GM/200ML-% IV SOLN
1000.0000 mg | Freq: Two times a day (BID) | INTRAVENOUS | Status: DC
  Administered 2021-10-26: 1000 mg via INTRAVENOUS
  Filled 2021-10-25: qty 200

## 2021-10-25 MED ORDER — ACETAMINOPHEN 650 MG RE SUPP
650.0000 mg | Freq: Once | RECTAL | Status: AC | PRN
  Administered 2021-10-25: 650 mg via RECTAL
  Filled 2021-10-25: qty 1

## 2021-10-25 MED ORDER — SODIUM CHLORIDE 0.9 % IV SOLN
2.0000 g | Freq: Three times a day (TID) | INTRAVENOUS | Status: DC
  Administered 2021-10-26 – 2021-10-27 (×5): 2 g via INTRAVENOUS
  Filled 2021-10-25 (×5): qty 2

## 2021-10-25 MED ORDER — VANCOMYCIN HCL 2000 MG/400ML IV SOLN
2000.0000 mg | Freq: Once | INTRAVENOUS | Status: AC
  Administered 2021-10-25: 2000 mg via INTRAVENOUS
  Filled 2021-10-25: qty 400

## 2021-10-25 NOTE — ED Provider Notes (Signed)
[facility] EMERGENCY DEPARTMENT Provider Note   CSN: [id number] Arrival date & time: 10/25/21  1454     History  Chief Complaint  Patient presents with   Fever    [name] is a 56 y.o. female.   Fever Patient presents from home for lethargy.  At baseline, she is bedbound.  She had a reported fever at home of 103.  EMS noted that she was warm to touch.  She was placed on end-tidal CO2 read at 60.  Her blood pressure was 80s over 50s.  Blood sugar was 269.  She is not on home oxygen at baseline.  SPO2 on room air was 75%.  She was placed on 4 L of supplemental oxygen.  She remains somnolent but alert and oriented during transit.  Per chart review, medical history is notable for reactive airway disease, pernicious anemia, HLD, CVA, GERD, chronic pain.  Patient, herself, currently denies any areas of pain.  History from the patient is severely limited due to the acuity of her condition and her somnolence.    Home Medications Prior to Admission medications   Medication Sig Start Date End Date Taking? Authorizing Provider  albuterol (VENTOLIN HFA) 108 (90 Base) MCG/ACT inhaler INHALE 2 PUFFS EVERY 6 HOURS AS NEEDED. 05/27/21  Yes Kathyrn Drown, MD  apixaban (ELIQUIS) 5 MG TABS tablet 1 bid 08/23/21  Yes [name], [name] A, MD  Calcium Carbonate (CALCIUM 600 PO) Take 2 tablets by mouth daily.    Yes [provider]  doxycycline (VIBRAMYCIN) 100 MG capsule Take 1 capsule (100 mg total) by mouth 2 (two) times daily for 4 days. 10/27/21 10/31/21 Yes Johnson, Clanford L, MD  gabapentin (NEURONTIN) 300 MG capsule TAKE 1 CAPSULE THREE TIMES DAILY Patient taking differently: Take 300 mg by mouth 3 (three) times daily. 08/09/21  Yes Kathyrn Drown, MD  naloxone [facility]) nasal spray 4 mg/0.1 mL Use as directed Patient taking differently: Place 0.4 mg into the nose once. Use as directed 06/08/20  Yes [name], Elayne Snare, MD  oxyCODONE-acetaminophen (PERCOCET/ROXICET) 5-325 MG tablet Take 1  tablet po every 4 hours as needed pain maximum 6/day 08/27/21  Yes [name], [name] A, MD  pantoprazole (PROTONIX) 40 MG tablet TAKE 1 TABLET BY MOUTH ONCE DAILY. 08/09/21  Yes Kathyrn Drown, MD  thiamine (VITAMIN B-1) 100 MG tablet Take 1 tablet (100 mg total) by mouth daily. 08/29/20  Yes Emokpae, Courage, MD  traZODone (DESYREL) 100 MG tablet TAKE 1 AND A HALF TABLET BY MOUTH AT BEDTIME. Patient taking differently: Take 100 mg by mouth See admin instructions. TAKE 1 AND A HALF TABLET BY MOUTH AT BEDTIME. 07/27/21  Yes [name], Elayne Snare, MD  vitamin B-12 (CYANOCOBALAMIN) 1000 MCG tablet Take 1,000 mcg by mouth daily.   Yes [provider]  ACCU-CHEK FASTCLIX LANCETS MISC USE TO TEST ONCE DAILY. Patient taking differently: 1 strip by Other route daily. 09/18/16   Kathyrn Drown, MD  blood glucose meter kit and supplies KIT Dispense based on patient and insurance preference. May test once a day due to prediabetes 01/13/21   Kathyrn Drown, MD  blood glucose meter kit and supplies Dispense based on patient and insurance preference. Use to check blood sugar once daily. (FOR ICD-10 E11.9). 01/14/21   Kathyrn Drown, MD  glucose blood (ONE TOUCH ULTRA TEST) test strip USE TO TEST ONCE DAILY. Patient taking differently: 1 each by Other route daily. 07/05/18   Kathyrn Drown, MD  potassium  chloride (KLOR-CON) 10 MEQ tablet Take 1 tablet (10 mEq total) by mouth 2 (two) times daily. 10/27/21   Murlean Iba, MD      Allergies    Ambien [zolpidem tartrate], Ceftin [cefuroxime axetil], Hctz [hydrochlorothiazide], Lodine [etodolac], Promethazine, Codeine, Latex, and Penicillins    Review of Systems   Review of Systems  Unable to perform ROS: Acuity of condition   Physical Exam Updated Vital Signs BP 109/79 (BP Location: Right Arm)    Pulse 60    Temp 98.2 F (36.8 C)    Resp 19    Ht _0  (1.575 m)    Wt 88.7 kg    SpO2 99%    BMI 35.77 kg/m  Physical Exam Vitals and nursing note reviewed.  Exam conducted with a chaperone present.  Constitutional:      General: She is not in acute distress.    Appearance: She is overweight. She is ill-appearing and toxic-appearing.     Interventions: Nasal cannula in place.  HENT:     Head: Normocephalic and atraumatic.     Right Ear: External ear normal.     Left Ear: External ear normal.     Nose: Nose normal.     Mouth/Throat:     Mouth: Mucous membranes are dry.     Pharynx: Oropharynx is clear.  Eyes:     General: No scleral icterus.    Pupils: Pupils are equal, round, and reactive to light.  Cardiovascular:     Rate and Rhythm: Normal rate and regular rhythm.     Pulses: Normal pulses.     Heart sounds: No murmur heard. Pulmonary:     Effort: No respiratory distress.     Breath sounds: Rhonchi present. No wheezing.  Chest:     Chest wall: No tenderness.  Abdominal:     Palpations: Abdomen is soft.     Tenderness: There is no abdominal tenderness. There is no guarding.  Genitourinary:      Comments: Lesion at 12:00 of vaginal introitus Musculoskeletal:        General: No swelling or deformity.     Cervical back: Neck supple. No tenderness.  Skin:    General: Skin is warm and dry.     Capillary Refill: Capillary refill takes less than 2 seconds.     Coloration: Skin is pale. Skin is not jaundiced.  Neurological:     Mental Status: She is lethargic and disoriented.     GCS: GCS eye subscore is 4. GCS verbal subscore is 4. GCS motor subscore is 6.     Cranial Nerves: No facial asymmetry.     Motor: No abnormal muscle tone.    ED Results / Procedures / Treatments   Labs (all labs ordered are listed, but only abnormal results are displayed) Labs Reviewed  CULTURE, BLOOD (ROUTINE X 2) - Abnormal; Notable for the following components:      Result Value   Culture   (*)    Value: STAPHYLOCOCCUS HOMINIS THE SIGNIFICANCE OF ISOLATING THIS ORGANISM FROM A SINGLE SET OF BLOOD CULTURES WHEN MULTIPLE SETS ARE DRAWN IS  UNCERTAIN. PLEASE NOTIFY THE MICROBIOLOGY DEPARTMENT WITHIN ONE WEEK IF SPECIATION AND SENSITIVITIES ARE REQUIRED. Performed at [facility], [place] [address] [place], [place] [address]    All other components within normal limits  BLOOD CULTURE ID PANEL (REFLEXED) - BCID2 - Abnormal; Notable for the following components:   Staphylococcus species DETECTED (*)    All other components  within normal limits  LACTIC ACID, PLASMA - Abnormal; Notable for the following components:   Lactic Acid, Venous 2.1 (*)    All other components within normal limits  LACTIC ACID, PLASMA - Abnormal; Notable for the following components:   Lactic Acid, Venous 3.0 (*)    All other components within normal limits  COMPREHENSIVE METABOLIC PANEL - Abnormal; Notable for the following components:   Glucose, Bld 164 (*)    Calcium 8.3 (*)    Total Protein 5.9 (*)    Albumin 2.7 (*)    All other components within normal limits  CBC WITH DIFFERENTIAL/PLATELET - Abnormal; Notable for the following components:   Hemoglobin 15.3 (*)    HCT 47.9 (*)    MCV 116.5 (*)    MCH 37.2 (*)    All other components within normal limits  PROTIME-INR - Abnormal; Notable for the following components:   Prothrombin Time 17.0 (*)    INR 1.4 (*)    All other components within normal limits  URINALYSIS, ROUTINE W REFLEX MICROSCOPIC - Abnormal; Notable for the following components:   Color, Urine AMBER (*)    Specific Gravity, Urine >1.030 (*)    Hgb urine dipstick MODERATE (*)    Protein, ur TRACE (*)    All other components within normal limits  BLOOD GAS, VENOUS - Abnormal; Notable for the following components:   pCO2, Ven 72.3 (*)    pO2, Ven 46.3 (*)    Bicarbonate 28.2 (*)    Acid-Base Excess 7.7 (*)    All other components within normal limits  URINALYSIS, MICROSCOPIC (REFLEX) - Abnormal; Notable for the following components:   Bacteria, UA FEW (*)    All other components within normal limits  BLOOD GAS, ARTERIAL  - Abnormal; Notable for the following components:   pCO2 arterial 51.7 (*)    pO2, Arterial 142 (*)    Acid-Base Excess 3.5 (*)    All other components within normal limits  COMPREHENSIVE METABOLIC PANEL - Abnormal; Notable for the following components:   Glucose, Bld 116 (*)    Calcium 8.0 (*)    Total Protein 5.8 (*)    Albumin 2.6 (*)    All other components within normal limits  CBC - Abnormal; Notable for the following components:   MCV 114.0 (*)    MCH 36.5 (*)    Platelets 144 (*)    All other components within normal limits  MAGNESIUM - Abnormal; Notable for the following components:   Magnesium 1.6 (*)    All other components within normal limits  BASIC METABOLIC PANEL - Abnormal; Notable for the following components:   Glucose, Bld 140 (*)    Calcium 7.5 (*)    All other components within normal limits  RESP PANEL BY RT-PCR (FLU A&B, COVID) [id number]  CULTURE, BLOOD (ROUTINE X 2)  URINE CULTURE  MRSA NEXT GEN BY PCR, NASAL  EXPECTORATED SPUTUM ASSESSMENT W GRAM STAIN, RFLX TO RESP C  BRAIN NATRIURETIC PEPTIDE  LIPASE, BLOOD  MAGNESIUM  TSH  HIV ANTIBODY (ROUTINE TESTING W REFLEX)  PHOSPHORUS  LACTIC ACID, PLASMA  LACTIC ACID, PLASMA  STREP PNEUMONIAE URINARY ANTIGEN  PROCALCITONIN  MAGNESIUM  LEGIONELLA PNEUMOPHILA SEROGP 1 UR AG  TROPONIN I (HIGH SENSITIVITY)  TROPONIN I (HIGH SENSITIVITY)    EKG EKG Interpretation  Date/Time:  Tuesday October 25 2021 15:02:30 EST Ventricular Rate:  74 PR Interval:  135 QRS Duration: 87 QT Interval:  390 QTC Calculation: 433 R Axis:  1 Text Interpretation: Sinus rhythm Confirmed by Godfrey Pick [phone]) on 10/25/2021 5:05:12 PM  Radiology CT Head Wo Contrast  Result Date: 10/25/2021 CLINICAL DATA:  Mental status change.  Fever.  Lethargy. EXAM: CT HEAD WITHOUT CONTRAST TECHNIQUE: Contiguous axial images were obtained from the base of the skull through the vertex without intravenous contrast. RADIATION DOSE REDUCTION:  This exam was performed according to the departmental dose-optimization program which includes automated exposure control, adjustment of the mA and/or kV according to patient size and/or use of iterative reconstruction technique. COMPARISON:  CT brain 10/12/2016 FINDINGS: Brain: There is mild cortical atrophy, unchanged from prior within normal limits for patient age. The ventricles are normal in configuration. The basilar cisterns are patent. No mass, mass effect, or midline shift. Symmetric globus pallidus focal hypodensities are unchanged, possible remote lacunar infarcts. No acute intracranial hemorrhage is seen. No abnormal extra-axial fluid collection. Mild periventricular white matter hypodensities, similar to prior. This is nonspecific but most likely secondary to chronic ischemic white matter change. Preservation of the normal cortical gray-white interface without CT evidence of an acute major vascular territorial cortical based infarction. Vascular: No hyperdense vessel or unexpected calcification. Skull: Normal. Negative for fracture or focal lesion. Sinuses/Orbits: Bilateral intra-ocular lens replacements are noted. Minimal mucosal opacification of the posterior aspect of the bilateral partially visualized maxillary sinuses. The visualized mastoid air cells are clear. Other: None. IMPRESSION: 1. Mild cortical atrophy and mild chronic ischemic white matter changes, unchanged from prior. 2. No acute intracranial process. Electronically Signed   By: Yvonne Kendall M.D.   On: 10/25/2021 15:50   CT Angio Chest PE W and/or Wo Contrast  Result Date: 10/25/2021 CLINICAL DATA:  Sepsis, fever, lethargy. EXAM: CT ANGIOGRAPHY CHEST CT ABDOMEN AND PELVIS WITH CONTRAST TECHNIQUE: Multidetector CT imaging of the chest was performed using the standard protocol during bolus administration of intravenous contrast. Multiplanar CT image reconstructions and MIPs were obtained to evaluate the vascular anatomy.  Multidetector CT imaging of the abdomen and pelvis was performed using the standard protocol during bolus administration of intravenous contrast. RADIATION DOSE REDUCTION: This exam was performed according to the departmental dose-optimization program which includes automated exposure control, adjustment of the mA and/or kV according to patient size and/or use of iterative reconstruction technique. CONTRAST:  155m OMNIPAQUE IOHEXOL 350 MG/ML SOLN COMPARISON:  CT chest 08/27/2020.  CT abdomen and pelvis 10/09/2014. FINDINGS: CTA CHEST FINDINGS Cardiovascular: There is suboptimal opacification of the pulmonary artery secondary to timing of the contrast bolus. There is no evidence for pulmonary embolism to the lobar level. The heart is mildly enlarged, unchanged. There is no pericardial effusion. Aorta is normal in size. Mediastinum/Nodes: No enlarged mediastinal, hilar, or axillary lymph nodes. Thyroid gland, trachea, and esophagus demonstrate no significant findings. Lungs/Pleura: There is atelectasis in the bilateral lower lobes. There are trace bilateral pleural effusions. There is no pneumothorax. Trachea and central airways are patent. Musculoskeletal: Cervical spinal fusion plate is partially visualized. No acute fractures are seen. Review of the MIP images confirms the above findings. CT ABDOMEN and PELVIS FINDINGS Hepatobiliary: There is diffuse fatty infiltration of the liver. No focal liver lesions are identified. Gallbladder appears within normal limits. No definite biliary ductal dilatation. Pancreas: Unremarkable. No pancreatic ductal dilatation or surrounding inflammatory changes. Spleen: Normal in size without focal abnormality. Adrenals/Urinary Tract: Adrenal glands are unremarkable. Kidneys are normal, without renal calculi, focal lesion, or hydronephrosis. Bladder is unremarkable. Stomach/Bowel: Stomach is within normal limits. No evidence of bowel wall thickening, distention, or  inflammatory  changes. The appendix is not seen. Vascular/Lymphatic: Aortic atherosclerosis. No enlarged abdominal or pelvic lymph nodes. There are some prominent inguinal lymph nodes bilaterally. Reproductive: Status post hysterectomy. No adnexal masses. Other: No abdominal wall hernia or abnormality. No abdominopelvic ascites. 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 No acute fractures. Review of the MIP images confirms the above findings. IMPRESSION: 1. Limited evaluation for pulmonary embolism secondary to timing of the contrast bolus. No evidence for pulmonary embolism to the lobar level. 2. Trace bilateral pleural effusions with bilateral lower lung atelectasis. 3. Cardiomegaly. 4. No acute localizing process in the abdomen or pelvis. 5. Fatty infiltration of the liver. 6.  Aortic Atherosclerosis (ICD10-I70.0). Electronically Signed   By: Ronney Asters M.D.   On: 10/25/2021 20:34   CT ABDOMEN PELVIS W CONTRAST  Result Date: 10/25/2021 CLINICAL DATA:  Sepsis, fever, lethargy. EXAM: CT ANGIOGRAPHY CHEST CT ABDOMEN AND PELVIS WITH CONTRAST TECHNIQUE: Multidetector CT imaging of the chest was performed using the standard protocol during bolus administration of intravenous contrast. Multiplanar CT image reconstructions and MIPs were obtained to evaluate the vascular anatomy. Multidetector CT imaging of the abdomen and pelvis was performed using the standard protocol during bolus administration of intravenous contrast. RADIATION DOSE REDUCTION: This exam was performed according to the departmental dose-optimization program which includes automated exposure control, adjustment of the mA and/or kV according to patient size and/or use of iterative reconstruction technique. CONTRAST:  185m OMNIPAQUE IOHEXOL 350 MG/ML SOLN COMPARISON:  CT chest 08/27/2020.  CT abdomen and pelvis 10/09/2014. FINDINGS: CTA CHEST FINDINGS Cardiovascular: There is suboptimal opacification of the pulmonary artery secondary  to timing of the contrast bolus. There is no evidence for pulmonary embolism to the lobar level. The heart is mildly enlarged, unchanged. There is no pericardial effusion. Aorta is normal in size. Mediastinum/Nodes: No enlarged mediastinal, hilar, or axillary lymph nodes. Thyroid gland, trachea, and esophagus demonstrate no significant findings. Lungs/Pleura: There is atelectasis in the bilateral lower lobes. There are trace bilateral pleural effusions. There is no pneumothorax. Trachea and central airways are patent. Musculoskeletal: Cervical spinal fusion plate is partially visualized. No acute fractures are seen. Review of the MIP images confirms the above findings. CT ABDOMEN and PELVIS FINDINGS Hepatobiliary: There is diffuse fatty infiltration of the liver. No focal liver lesions are identified. Gallbladder appears within normal limits. No definite biliary ductal dilatation. Pancreas: Unremarkable. No pancreatic ductal dilatation or surrounding inflammatory changes. Spleen: Normal in size without focal abnormality. Adrenals/Urinary Tract: Adrenal glands are unremarkable. Kidneys are normal, without renal calculi, focal lesion, or hydronephrosis. Bladder is unremarkable. Stomach/Bowel: Stomach is within normal limits. No evidence of bowel wall thickening, distention, or inflammatory changes. The appendix is not seen. Vascular/Lymphatic: Aortic atherosclerosis. No enlarged abdominal or pelvic lymph nodes. There are some prominent inguinal lymph nodes bilaterally. Reproductive: Status post hysterectomy. No adnexal masses. Other: No abdominal wall hernia or abnormality. No abdominopelvic ascites. 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 No acute fractures. Review of the MIP images confirms the above findings. IMPRESSION: 1. Limited evaluation for pulmonary embolism secondary to timing of the contrast bolus. No evidence for pulmonary embolism to the lobar level. 2. Trace bilateral  pleural effusions with bilateral lower lung atelectasis. 3. Cardiomegaly. 4. No acute localizing process in the abdomen or pelvis. 5. Fatty infiltration of the liver. 6.  Aortic Atherosclerosis (ICD10-I70.0). Electronically Signed   By: ARonney AstersM.D.   On: 10/25/2021 20:34   [facility]  Result  Date: 10/25/2021 CLINICAL DATA:  Sepsis EXAM: PORTABLE CHEST 1 VIEW COMPARISON:  Chest x-ray 08/26/2020 FINDINGS: Heart size and mediastinal contours are within normal limits. Faint residual opacity in the left lower lung zone since previous study may represent scarring. No suspicious consolidation identified. No pleural effusion or pneumothorax visualized. No acute osseous abnormality appreciated. IMPRESSION: No acute intrathoracic process identified. Electronically Signed   By: Ofilia Neas M.D.   On: 10/25/2021 15:25    Procedures Procedures    Medications Ordered in ED Medications  Chlorhexidine Gluconate Cloth 2 % PADS 6 each (6 each Topical Given 10/27/21 0900)  dextromethorphan-guaiFENesin ([place] DM) 30-600 MG per 12 hr tablet 1 tablet (1 tablet Oral Given 10/27/21 0859)  feeding supplement (GLUCERNA SHAKE) (GLUCERNA SHAKE) liquid 237 mL (237 mLs Oral Not Given 10/27/21 0900)  pantoprazole (PROTONIX) EC tablet 40 mg (40 mg Oral Given 10/27/21 0859)  apixaban (ELIQUIS) tablet 5 mg (5 mg Oral Given 10/27/21 0859)  albuterol (VENTOLIN HFA) 108 (90 Base) MCG/ACT inhaler 2 puff (has no administration in time range)  traZODone (DESYREL) tablet 100 mg (100 mg Oral Given 10/26/21 2202)  oxyCODONE-acetaminophen (PERCOCET/ROXICET) 5-325 MG per tablet 1 tablet (1 tablet Oral Given 10/27/21 1045)  gabapentin (NEURONTIN) capsule 300 mg (300 mg Oral Given 10/27/21 0859)  acetaminophen (TYLENOL) tablet 650 mg (has no administration in time range)  lactated ringers infusion ( Intravenous New Bag/Given 10/26/21 1138)  azithromycin (ZITHROMAX) 500 mg in sodium chloride 0.9 % 250 mL IVPB (500 mg  Intravenous New Bag/Given 10/27/21 0908)  cefTRIAXone (ROCEPHIN) 2 g in sodium chloride 0.9 % 100 mL IVPB (2 g Intravenous New Bag/Given 10/27/21 1209)  lactated ringers bolus 1,000 mL (0 mLs Intravenous Stopped 10/25/21 1620)    And  lactated ringers bolus 1,000 mL (0 mLs Intravenous Stopped 10/25/21 1650)    And  lactated ringers bolus 1,000 mL (0 mLs Intravenous Stopped 10/25/21 1944)  ceFEPIme (MAXIPIME) 2 g in sodium chloride 0.9 % 100 mL IVPB (0 g Intravenous Stopped 10/25/21 1604)  metroNIDAZOLE (FLAGYL) IVPB 500 mg (0 mg Intravenous Stopped 10/25/21 1649)  acetaminophen (TYLENOL) suppository 650 mg (650 mg Rectal Given 10/25/21 1558)  vancomycin (VANCOREADY) IVPB 2000 mg/400 mL (0 mg Intravenous Stopped 10/25/21 1944)  iohexol (OMNIPAQUE) 350 MG/ML injection 100 mL (100 mLs Intravenous Contrast Given 10/25/21 2021)  magnesium sulfate IVPB 2 g 50 mL (2 g Intravenous New Bag/Given 10/26/21 0629)  magnesium sulfate IVPB 2 g 50 mL (2 g Intravenous New Bag/Given 10/26/21 1624)    ED Course/ Medical Decision Making/ A&P                           Medical Decision Making Amount and/or Complexity of Data Reviewed Labs: ordered. Radiology: ordered. ECG/medicine tests: ordered.  Risk OTC drugs. Prescription drug management. Decision regarding hospitalization.   This patient presents to the ED for concern of altered mental status, this involves an extensive number of treatment options, and is a complaint that carries with it a high risk of complications and morbidity.  The differential diagnosis includes sepsis, trauma, CVA, polypharmacy, intoxication, withdrawal, hypercarbia, hepatic encephalopathy, metabolic derangements, DKA, HHS   Co morbidities that complicate the patient evaluation  Coagulopathy, diabetes, COPD, chronic pain, obesity, unspecified neuromuscular disorder, bedbound status   Additional history obtained:  Additional history obtained from EMS External records from outside  source obtained and reviewed including EMR   Lab Tests:  I Ordered, and personally interpreted labs.  The  pertinent results include: Respiratory acidosis; lactic acidosis; no leukocytosis, concern for UTI   Imaging Studies ordered:  I ordered imaging studies including chest x-ray, CT head,, CTA chest, CT of abdomen and pelvis I independently visualized and interpreted imaging which showed CT head: Chronic cortical atrophy without any acute findings; CTA chest: Trace bilateral pleural effusions without any other acute findings; CT of abdomen and pelvis: Fatty liver without any acute findings. I agree with the radiologist interpretation   Cardiac Monitoring:  The patient was maintained on a cardiac monitor.  I personally viewed and interpreted the cardiac monitored which showed an underlying rhythm of: Sinus rhythm   Medicines ordered and prescription drug management:  I ordered medication including IV fluids and broad-spectrum antibiotics for empiric treatment of sepsis Reevaluation of the patient after these medicines showed that the patient improved I have reviewed the patients home medicines and have made adjustments as needed  Critical Interventions:  BiPAP was initiated for patient's hypercarbic respiratory failure.  Problem List / ED Course:  56 year old female presenting for lethargy and altered mental status.  Per EMS, this was first noticed at home today.  There is also report of a high-grade fever at home.  On arrival, patient is somnolent but arousable.  She is able to answer basic questions but is not able to provide any relevant history.  Rectal temperature showed a low-grade fever.  IV fluids and broad-spectrum antibiotics were initiated for empiric treatment of sepsis.  Blood gas showed a respiratory acidosis and patient was started on BiPAP.  On reassessment, patient was remarkably improved.  She was tolerating BiPAP well.  Pertinent lab findings include a lactic  acidosis on arrival.  Following bolus with fluids, lactic acid increased.  Patient was, however, able to maintain normal blood pressures.  Source of infection appears to be urinary.  She underwent multiple CT scans which did not demonstrate any other acute findings.  Patient was noted to have a gray, tender lesion at the 12 o'clock position of her vaginal introitus.  There was no surrounding erythema or induration.  This will likely require nonemergent follow-up.  Patient remained on BiPAP and was admitted for further management.   Reevaluation:  After the interventions noted above, I reevaluated the patient and found that they have :improved   Social Determinants of Health:  Patient is chronically bedbound at home and this limits her access to medical care.   Dispostion:  After consideration of the diagnostic results and the patients response to treatment, I feel that the patent would benefit from admission to hospital.    CRITICAL CARE Performed by: Godfrey Pick   Total critical care time: 35 minutes  Critical care time was exclusive of separately billable procedures and treating other patients.  Critical care was necessary to treat or prevent imminent or life-threatening deterioration.  Critical care was time spent personally by me on the following activities: development of treatment plan with patient and/or surrogate as well as nursing, discussions with consultants, evaluation of patient's response to treatment, examination of patient, obtaining history from patient or surrogate, ordering and performing treatments and interventions, ordering and review of laboratory studies, ordering and review of radiographic studies, pulse oximetry and re-evaluation of patient's condition.         Final Clinical Impression(s) / ED Diagnoses Final diagnoses:  Fever in adult  Acute respiratory failure with hypoxia and hypercapnia ([place])    Rx / DC Orders ED Discharge Orders  Ordered    potassium chloride (KLOR-CON) 10 MEQ tablet  2 times daily        10/27/21 1141    doxycycline (VIBRAMYCIN) 100 MG capsule  2 times daily        10/27/21 1141              Godfrey Pick, MD 10/27/21 1331

## 2021-10-25 NOTE — Sepsis Progress Note (Signed)
Sepsis protocol monitored by eLink 

## 2021-10-25 NOTE — ED Triage Notes (Signed)
EMS called to home for fever and pt being lethargic. Pt had fever at home up to 103. EMS- CO2 60, BP 80/50 initially decreased 60/40, cbg 269, O2 sat 75% on RA, placed on 4L brought up to 93%. Pt is alert and oriented x 4 but drowsy.

## 2021-10-25 NOTE — Sepsis Progress Note (Signed)
Notified provider of need to order third lactic acid.

## 2021-10-25 NOTE — ED Notes (Signed)
Caregiver Waymon Budge was at bedside today and stated we could call with any questions - [phone]. She cares for her 6 days/week.

## 2021-10-25 NOTE — ED Notes (Signed)
Cleaned pt's bottom and changed bed sheets, brief, and blankets. Nurse notified.

## 2021-10-25 NOTE — Progress Notes (Signed)
Pt transported to CT and back on BIPAP with no complications

## 2021-10-25 NOTE — Progress Notes (Signed)
Pharmacy Antibiotic Note  [name] a 56 y.o. female admitted on 10/25/2021 with sepsis.  Pharmacy has been consulted for vancomycin and cefepime dosing.  Plan: Vancomycin 1000mg  IV every 12 hours.  Goal trough 15-20 mcg/mL. Cefepime 2gm IV every 8 hours.  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    Hypertension    Impaired fasting glucose    Migraine    "I use to" (02/11/2013)   MS (multiple sclerosis) (HCC)    Muscle weakness of lower extremity    bilateral   Peripheral neuropathy    /notes 02/11/2013   Peripheral neuropathy    PONV (postoperative nausea and vomiting)    UTI (lower urinary tract infection) 02/11/2013   [name] 02/11/2013    Allergies:  Allergies  Allergen Reactions   Ambien [Zolpidem Tartrate] Other (See Comments)    Crazy dreams   Ceftin [Cefuroxime Axetil] Nausea And Vomiting    Pt tolerated Cefepime 09/2014 admission   Hctz [Hydrochlorothiazide] Other (See Comments)    Urinary retention   Lodine [Etodolac] Hives   Promethazine Other (See Comments)    Hallucinations.    Codeine Rash   Latex Rash   Penicillins Rash    Filed Weights   10/25/21 1458  Weight: 88.7 kg (195 lb 8.8 oz)    CBC Latest Ref Rng & Units 10/25/2021 08/28/2020 08/27/2020  WBC 4.0 - 10.5 K/uL 5.6 10.0 11.2(H)  Hemoglobin 12.0 - 15.0 g/dL 15.3(H) 13.8 15.0  Hematocrit 36.0 - 46.0 % 47.9(H) 43.3 47.2(H)   Platelets 150 - 400 K/uL 159 150 146(L)     Estimated Creatinine Clearance: 73.8 mL/min (by C-G formula based on SCr of 0.88 mg/dL).  Antibiotics Given (last 72 hours)     Date/Time Action Medication Dose Rate   10/25/21 1530 New Bag/Given   ceFEPIme (MAXIPIME) 2 g in sodium chloride 0.9 % 100 mL IVPB 2 g 200 mL/hr   10/25/21 1549 New Bag/Given   metroNIDAZOLE (FLAGYL) IVPB 500 mg 500 mg 100 mL/hr   10/25/21 1708 New Bag/Given   vancomycin (VANCOREADY) IVPB 2000 mg/400 mL 2,000 mg 200 mL/hr       Antimicrobials this admission:   Cefepime 10/25/2021 >>  vancomycin 10/25/2021 >>  Metronidazole 10/25/2021  x 1   Microbiology results: 10/25/2021 BCx: sent 10/25/2021 UCx: sent 10/25/2021 Resp Panel: sent  10/25/2021 MRSA PCR: sent  Thank you for allowing pharmacy to be a part of this patients care.  Thomasenia Sales, PharmD Clinical Pharmacist

## 2021-10-25 NOTE — H&P (Signed)
History and Physical  [name] FIE:[id number] DOB: 1966/07/25 DOA: 10/25/2021  Referring physician: Godfrey Pick, MD PCP: Kathyrn Drown, MD  Patient coming from: Home  Chief Complaint: Fever  HPI: [name] is a 56 y.o. female with medical history significant for factor V Leiden mutation, primary hypercoagulable state, major depression, COPD, chronic pain syndrome who presents to the emergency department via EMS due to fever of 103F and O2 sat at home was 75% on room air, BP was low at 80s/50s, so her caretaker activated EMS (patient is bedridden at baseline).  On arrival of EMS team, patient was placed on supplemental oxygen at 4 LPM, she was lethargic en route to the ED.  There was no report of vomiting, diarrhea, constipation, headache or any sick contacts  ED Course:  In the Emergency Department, she was initially bradycardic, BP was 105/67, ABG showed hypoxia and hypercapnia, patient was placed on BiPAP and O2 sat was 100%.  Work-up in the ED showed macrocytic anemia, hyperglycemia, lactic acid 2.1 > 3.0.  Magnesium 1.6, urinalysis was unimpressive for UTI, BNP 66, lipase 34, troponin was within normal range.  Influenza A, B, SARS coronavirus 2 was negative. CTA chest showed no evidence of pulmonary embolism, but showed trace bilateral pleural effusions with bilateral lower lung atelectasis. CT head without contrast showed no acute intracranial process. Chest x-ray showed no acute intrathoracic process identified. She was treated with IV cefepime Flagyl and vancomycin.  IV hydration was provided.  Tylenol was given. Hospitalist was asked to admit patient for further evaluation and management.  Review of Systems: A full 10 point Review of Systems was done, except as stated above, all other Review of systems were negative.  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    Hypertension    Impaired fasting glucose    Migraine    "I use to" (02/11/2013)   MS (multiple sclerosis) (HCC)    Muscle weakness of lower extremity    bilateral   Peripheral neuropathy    /notes 02/11/2013   Peripheral neuropathy    PONV (postoperative nausea and vomiting)    UTI (lower urinary tract infection) 02/11/2013   [name] 02/11/2013   Past Surgical History:  Procedure Laterality Date   ABDOMINAL HYSTERECTOMY  ~ 1987; ~ 2004   "[place]; [place]" (02/11/2013)   ANTERIOR CERVICAL DECOMP/DISCECTOMY FUSION  2009; 2011   APPENDECTOMY     CATARACT EXTRACTION W/PHACO  06/20/2011   Procedure: CATARACT EXTRACTION PHACO AND INTRAOCULAR LENS PLACEMENT ([place]);  Surgeon: [name] T. Gershon Crane;  Location: [facility];  Service: Ophthalmology;  Laterality: Left;  CDE 1.81   CATARACT EXTRACTION W/PHACO  07/04/2011   Procedure: CATARACT EXTRACTION PHACO AND INTRAOCULAR LENS PLACEMENT ([facility]);  Surgeon: [name] T. Gershon Crane;  Location: [facility];  Service: Ophthalmology;  Laterality: Right;  CDE: 1.76  CESAREAN SECTION  1982; 1984; 1986   EMBOLECTOMY Left 09/27/2014   Procedure: Left Brachial, Radial,Ulnar Embolectomy with patch angioplasty left brachial, radial and ulnar artery;  Surgeon: [name], MD;  Location: [facility];  Service: Vascular;  Laterality: Left;   EMBOLECTOMY Left 09/27/2014   Procedure: Left Radial, Brachial, and Ulnar Thrombectomy; Left Brachial to Radial Bypass Graft using Greater Saphenous vein graft from Left Thigh; Left Saphenous Vein Harvest;  Intraoperative Arteriogram; Intra-arterial administration of TPA;  Surgeon: [name], MD;  Location: [facility];  Service: Vascular;  Laterality: Left;   LAPAROTOMY N/A 09/27/2014   Procedure: Exploratory Laparotomy, Biopsy of Perforated Gastric Ulcer, Closure with omental patch;  Surgeon: [name], MD;  Location: [facility];  Service: General;  Laterality: N/A;   TONSILLECTOMY  1990's?   TRACHEOSTOMY N/A december 2015   YAG LASER APPLICATION Right 8/50/2774   Procedure: YAG LASER APPLICATION;  Surgeon: [name] T. Gershon Crane, MD;  Location: [facility];  Service: Ophthalmology;  Laterality: Right;   YAG LASER APPLICATION Left 11/05/7865   Procedure: YAG LASER APPLICATION;  Surgeon: [name] T. Gershon Crane, MD;  Location: [facility];  Service: Ophthalmology;  Laterality: Left;    Social History:  reports that she has quit smoking. Her smoking use included cigarettes. She has a 33.00 pack-year smoking history. She quit smokeless tobacco use about 7 years ago. She reports that she does not drink alcohol and does not use drugs.   Allergies  Allergen Reactions   Ambien [Zolpidem Tartrate] Other (See Comments)    Crazy dreams   Ceftin [Cefuroxime Axetil] Nausea And Vomiting    Pt tolerated Cefepime 09/2014 admission   Hctz [Hydrochlorothiazide] Other (See Comments)    Urinary retention   Lodine [Etodolac] Hives   Promethazine Other (See Comments)    Hallucinations.    Codeine Rash   Latex Rash   Penicillins Rash    Family History  Problem Relation Age of Onset   Hyperlipidemia Mother    Heart disease Mother    Cancer Mother        lung   Diabetes Father    Heart disease Father    Cancer Maternal Grandmother        breast   Anesthesia problems Neg Hx    Hypotension Neg Hx    Malignant hyperthermia Neg Hx    Pseudochol deficiency Neg Hx      Prior to Admission medications   Medication Sig Start Date End Date Taking? Authorizing Provider  albuterol (VENTOLIN HFA) 108 (90 Base) MCG/ACT inhaler  INHALE 2 PUFFS EVERY 6 HOURS AS NEEDED. 05/27/21  Yes Kathyrn Drown, MD  apixaban (ELIQUIS) 5 MG TABS tablet 1 bid 08/23/21  Yes [name], [name] A, MD  Calcium Carbonate (CALCIUM 600 PO) Take 2 tablets by mouth daily.    Yes [provider]  gabapentin (NEURONTIN) 300 MG capsule TAKE 1 CAPSULE THREE TIMES DAILY Patient taking differently: Take 300 mg by mouth 3 (three) times daily. 08/09/21  Yes Kathyrn Drown, MD  naloxone [facility]) nasal spray 4 mg/0.1 mL Use as directed Patient taking differently: Place 0.4 mg into the nose once. Use as directed 06/08/20  Yes [name], Elayne Snare, MD  oxyCODONE-acetaminophen (PERCOCET/ROXICET) 5-325 MG tablet Take 1 tablet po every 4 hours as needed pain maximum 6/day 08/27/21  Yes [name], [name] A, MD  pantoprazole (PROTONIX) 40 MG tablet TAKE 1 TABLET BY MOUTH ONCE DAILY. 08/09/21  Yes Kathyrn Drown, MD  potassium chloride (KLOR-CON) 10 MEQ tablet  Take 2 tablets (20 mEq total) by mouth daily. Patient taking differently: Take 10 mEq by mouth 2 (two) times daily. 08/23/21  Yes Kathyrn Drown, MD  thiamine (VITAMIN B-1) 100 MG tablet Take 1 tablet (100 mg total) by mouth daily. 08/29/20  Yes Emokpae, Courage, MD  traZODone (DESYREL) 100 MG tablet TAKE 1 AND A HALF TABLET BY MOUTH AT BEDTIME. Patient taking differently: Take 100 mg by mouth See admin instructions. TAKE 1 AND A HALF TABLET BY MOUTH AT BEDTIME. 07/27/21  Yes [name], Elayne Snare, MD  vitamin B-12 (CYANOCOBALAMIN) 1000 MCG tablet Take 1,000 mcg by mouth daily.   Yes [provider]  ACCU-CHEK FASTCLIX LANCETS MISC USE TO TEST ONCE DAILY. Patient taking differently: 1 strip by Other route daily. 09/18/16   Kathyrn Drown, MD  azithromycin (ZITHROMAX) 250 MG tablet Take two tablets on first day and then one tablet daily for next 4 days Patient not taking: Reported on 10/25/2021 10/05/21   Kathyrn Drown, MD  blood glucose meter kit and supplies KIT Dispense based on patient and insurance  preference. May test once a day due to prediabetes 01/13/21   Kathyrn Drown, MD  blood glucose meter kit and supplies Dispense based on patient and insurance preference. Use to check blood sugar once daily. (FOR ICD-10 E11.9). 01/14/21   Kathyrn Drown, MD  glucose blood (ONE TOUCH ULTRA TEST) test strip USE TO TEST ONCE DAILY. Patient taking differently: 1 each by Other route daily. 07/05/18   Kathyrn Drown, MD  hydrOXYzine (ATARAX/VISTARIL) 10 MG tablet TAKE (1) TABLET BY MOUTH (3) TIMES DAILY. Patient not taking: Reported on 10/25/2021 06/03/21   Kathyrn Drown, MD  hydrOXYzine (VISTARIL) 25 MG capsule TAKE 1 CAPSULE BY MOUTH THREE TIMES A DAY AS NEEDED. Patient not taking: Reported on 10/25/2021 09/05/21   Kathyrn Drown, MD  ketoconazole (NIZORAL) 2 % cream Apply 1 application topically 2 (two) times daily. Patient not taking: Reported on 10/25/2021 01/11/21   Kathyrn Drown, MD  oxyCODONE-acetaminophen (PERCOCET/ROXICET) 5-325 MG tablet TAKE 1 TABLET BY MOUTH EVERY 4 HOURS AS NEEDED, MAX 6 TABLETS PER DAY. Patient not taking: Reported on 10/25/2021 08/27/21   Kathyrn Drown, MD  oxyCODONE-acetaminophen (PERCOCET/ROXICET) 5-325 MG tablet 1 every 4 hours as needed severe pain Patient not taking: Reported on 10/25/2021 08/27/21   Kathyrn Drown, MD  triamcinolone (KENALOG) 0.1 % Apply BID prn to affected area Patient not taking: Reported on 10/25/2021 12/13/20   Kathyrn Drown, MD  triamcinolone (KENALOG) 0.1 % Apply 1 application topically 2 (two) times daily. Patient not taking: Reported on 10/25/2021 12/13/20   Kathyrn Drown, MD    Physical Exam: BP 122/81    Pulse 79    Temp 98.4 F (36.9 C) (Axillary)    Resp 10    Ht _0  (1.575 m)    Wt 88.7 kg    SpO2 100%    BMI 35.77 kg/m   General: 56 y.o. year-old obese female on BiPAP but in no acute distress.  Alert and oriented x3. HEENT: NCAT, EOMI Neck: Supple, trachea medial Cardiovascular: Regular rate and rhythm with no rubs or gallops.   No thyromegaly or JVD noted.  No lower extremity edema. 2/4 pulses in all 4 extremities. Respiratory: Diffuse rhonchi on auscultation with no wheezes.  Abdomen: Soft, nontender nondistended with normal bowel sounds x4 quadrants. Muskuloskeletal: No cyanosis, clubbing or edema noted bilaterally Neuro: CN II-XII intact, sensation, reflexes intact  Skin: No ulcerative lesions noted or rashes Psychiatry: Mood is appropriate for condition and setting          Labs on Admission:  Basic Metabolic Panel: Recent Labs  Lab 10/25/21 1515 10/26/21 0050  NA 138 138  K 4.3 4.1  CL 99 101  CO2 32 29  GLUCOSE 164* 116*  BUN 11 10  CREATININE 0.88 0.55  CALCIUM 8.3* 8.0*  MG 1.8 1.6*  PHOS  --  2.7   Liver Function Tests: Recent Labs  Lab 10/25/21 1515 10/26/21 0050  AST 23 22  ALT 13 13  ALKPHOS 66 59  BILITOT 0.9 0.9  PROT 5.9* 5.8*  ALBUMIN 2.7* 2.6*   Recent Labs  Lab 10/25/21 1515  LIPASE 34   No results for input(s): AMMONIA in the last 168 hours. CBC: Recent Labs  Lab 10/25/21 1515 10/26/21 0050  WBC 5.6 5.4  NEUTROABS 4.3  --   HGB 15.3* 14.6  HCT 47.9* 45.6  MCV 116.5* 114.0*  PLT 159 144*   Cardiac Enzymes: No results for input(s): CKTOTAL, CKMB, CKMBINDEX, TROPONINI in the last 168 hours.  BNP (last 3 results) Recent Labs    10/25/21 1502  BNP 66.0    ProBNP (last 3 results) No results for input(s): PROBNP in the last 8760 hours.  CBG: No results for input(s): GLUCAP in the last 168 hours.  Radiological Exams on Admission: CT Head Wo Contrast  Result Date: 10/25/2021 CLINICAL DATA:  Mental status change.  Fever.  Lethargy. EXAM: CT HEAD WITHOUT CONTRAST TECHNIQUE: Contiguous axial images were obtained from the base of the skull through the vertex without intravenous contrast. RADIATION DOSE REDUCTION: This exam was performed according to the departmental dose-optimization program which includes automated exposure control, adjustment of the mA  and/or kV according to patient size and/or use of iterative reconstruction technique. COMPARISON:  CT brain 10/12/2016 FINDINGS: Brain: There is mild cortical atrophy, unchanged from prior within normal limits for patient age. The ventricles are normal in configuration. The basilar cisterns are patent. No mass, mass effect, or midline shift. Symmetric globus pallidus focal hypodensities are unchanged, possible remote lacunar infarcts. No acute intracranial hemorrhage is seen. No abnormal extra-axial fluid collection. Mild periventricular white matter hypodensities, similar to prior. This is nonspecific but most likely secondary to chronic ischemic white matter change. Preservation of the normal cortical gray-white interface without CT evidence of an acute major vascular territorial cortical based infarction. Vascular: No hyperdense vessel or unexpected calcification. Skull: Normal. Negative for fracture or focal lesion. Sinuses/Orbits: Bilateral intra-ocular lens replacements are noted. Minimal mucosal opacification of the posterior aspect of the bilateral partially visualized maxillary sinuses. The visualized mastoid air cells are clear. Other: None. IMPRESSION: 1. Mild cortical atrophy and mild chronic ischemic white matter changes, unchanged from prior. 2. No acute intracranial process. Electronically Signed   By: Yvonne Kendall M.D.   On: 10/25/2021 15:50   CT Angio Chest PE W and/or Wo Contrast  Result Date: 10/25/2021 CLINICAL DATA:  Sepsis, fever, lethargy. EXAM: CT ANGIOGRAPHY CHEST CT ABDOMEN AND PELVIS WITH CONTRAST TECHNIQUE: Multidetector CT imaging of the chest was performed using the standard protocol during bolus administration of intravenous contrast. Multiplanar CT image reconstructions and MIPs were obtained to evaluate the vascular anatomy. Multidetector CT imaging of the abdomen and pelvis was performed using the standard protocol during bolus administration of intravenous contrast. RADIATION  DOSE REDUCTION: This exam was performed according to the departmental dose-optimization program which includes automated exposure control, adjustment  of the mA and/or kV according to patient size and/or use of iterative reconstruction technique. CONTRAST:  165m OMNIPAQUE IOHEXOL 350 MG/ML SOLN COMPARISON:  CT chest 08/27/2020.  CT abdomen and pelvis 10/09/2014. FINDINGS: CTA CHEST FINDINGS Cardiovascular: There is suboptimal opacification of the pulmonary artery secondary to timing of the contrast bolus. There is no evidence for pulmonary embolism to the lobar level. The heart is mildly enlarged, unchanged. There is no pericardial effusion. Aorta is normal in size. Mediastinum/Nodes: No enlarged mediastinal, hilar, or axillary lymph nodes. Thyroid gland, trachea, and esophagus demonstrate no significant findings. Lungs/Pleura: There is atelectasis in the bilateral lower lobes. There are trace bilateral pleural effusions. There is no pneumothorax. Trachea and central airways are patent. Musculoskeletal: Cervical spinal fusion plate is partially visualized. No acute fractures are seen. Review of the MIP images confirms the above findings. CT ABDOMEN and PELVIS FINDINGS Hepatobiliary: There is diffuse fatty infiltration of the liver. No focal liver lesions are identified. Gallbladder appears within normal limits. No definite biliary ductal dilatation. Pancreas: Unremarkable. No pancreatic ductal dilatation or surrounding inflammatory changes. Spleen: Normal in size without focal abnormality. Adrenals/Urinary Tract: Adrenal glands are unremarkable. Kidneys are normal, without renal calculi, focal lesion, or hydronephrosis. Bladder is unremarkable. Stomach/Bowel: Stomach is within normal limits. No evidence of bowel wall thickening, distention, or inflammatory changes. The appendix is not seen. Vascular/Lymphatic: Aortic atherosclerosis. No enlarged abdominal or pelvic lymph nodes. There are some prominent inguinal  lymph nodes bilaterally. Reproductive: Status post hysterectomy. No adnexal masses. Other: No abdominal wall hernia or abnormality. No abdominopelvic ascites. 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 No acute fractures. Review of the MIP images confirms the above findings. IMPRESSION: 1. Limited evaluation for pulmonary embolism secondary to timing of the contrast bolus. No evidence for pulmonary embolism to the lobar level. 2. Trace bilateral pleural effusions with bilateral lower lung atelectasis. 3. Cardiomegaly. 4. No acute localizing process in the abdomen or pelvis. 5. Fatty infiltration of the liver. 6.  Aortic Atherosclerosis (ICD10-I70.0). Electronically Signed   By: ARonney AstersM.D.   On: 10/25/2021 20:34   CT ABDOMEN PELVIS W CONTRAST  Result Date: 10/25/2021 CLINICAL DATA:  Sepsis, fever, lethargy. EXAM: CT ANGIOGRAPHY CHEST CT ABDOMEN AND PELVIS WITH CONTRAST TECHNIQUE: Multidetector CT imaging of the chest was performed using the standard protocol during bolus administration of intravenous contrast. Multiplanar CT image reconstructions and MIPs were obtained to evaluate the vascular anatomy. Multidetector CT imaging of the abdomen and pelvis was performed using the standard protocol during bolus administration of intravenous contrast. RADIATION DOSE REDUCTION: This exam was performed according to the departmental dose-optimization program which includes automated exposure control, adjustment of the mA and/or kV according to patient size and/or use of iterative reconstruction technique. CONTRAST:  1061mOMNIPAQUE IOHEXOL 350 MG/ML SOLN COMPARISON:  CT chest 08/27/2020.  CT abdomen and pelvis 10/09/2014. FINDINGS: CTA CHEST FINDINGS Cardiovascular: There is suboptimal opacification of the pulmonary artery secondary to timing of the contrast bolus. There is no evidence for pulmonary embolism to the lobar level. The heart is mildly enlarged, unchanged. There is no  pericardial effusion. Aorta is normal in size. Mediastinum/Nodes: No enlarged mediastinal, hilar, or axillary lymph nodes. Thyroid gland, trachea, and esophagus demonstrate no significant findings. Lungs/Pleura: There is atelectasis in the bilateral lower lobes. There are trace bilateral pleural effusions. There is no pneumothorax. Trachea and central airways are patent. Musculoskeletal: Cervical spinal fusion plate is partially visualized. No acute fractures are seen. Review of the MIP images  confirms the above findings. CT ABDOMEN and PELVIS FINDINGS Hepatobiliary: There is diffuse fatty infiltration of the liver. No focal liver lesions are identified. Gallbladder appears within normal limits. No definite biliary ductal dilatation. Pancreas: Unremarkable. No pancreatic ductal dilatation or surrounding inflammatory changes. Spleen: Normal in size without focal abnormality. Adrenals/Urinary Tract: Adrenal glands are unremarkable. Kidneys are normal, without renal calculi, focal lesion, or hydronephrosis. Bladder is unremarkable. Stomach/Bowel: Stomach is within normal limits. No evidence of bowel wall thickening, distention, or inflammatory changes. The appendix is not seen. Vascular/Lymphatic: Aortic atherosclerosis. No enlarged abdominal or pelvic lymph nodes. There are some prominent inguinal lymph nodes bilaterally. Reproductive: Status post hysterectomy. No adnexal masses. Other: No abdominal wall hernia or abnormality. No abdominopelvic ascites. 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 No acute fractures. Review of the MIP images confirms the above findings. IMPRESSION: 1. Limited evaluation for pulmonary embolism secondary to timing of the contrast bolus. No evidence for pulmonary embolism to the lobar level. 2. Trace bilateral pleural effusions with bilateral lower lung atelectasis. 3. Cardiomegaly. 4. No acute localizing process in the abdomen or pelvis. 5. Fatty infiltration of  the liver. 6.  Aortic Atherosclerosis (ICD10-I70.0). Electronically Signed   By: Ronney Asters M.D.   On: 10/25/2021 20:34   DG Chest Port 1 View  Result Date: 10/25/2021 CLINICAL DATA:  Sepsis EXAM: PORTABLE CHEST 1 VIEW COMPARISON:  Chest x-ray 08/26/2020 FINDINGS: Heart size and mediastinal contours are within normal limits. Faint residual opacity in the left lower lung zone since previous study may represent scarring. No suspicious consolidation identified. No pleural effusion or pneumothorax visualized. No acute osseous abnormality appreciated. IMPRESSION: No acute intrathoracic process identified. Electronically Signed   By: Ofilia Neas M.D.   On: 10/25/2021 15:25    EKG: I independently viewed the EKG done and my findings are as followed: Normal sinus rhythm at a rate of 74 bpm  Assessment/Plan Present on Admission:  Acute on chronic respiratory failure with hypoxia and hypercapnia (HCC)  Hyperglycemia  Factor V Leiden mutation, heterozygous ([place])  Principal Problem:   Acute on chronic respiratory failure with hypoxia and hypercapnia (HCC) Active Problems:   Hyperglycemia   Factor V Leiden mutation, heterozygous (HCC)   Lactic acidosis   Bilateral pleural effusion   Obesity (BMI 30-39.9)   Hypoalbuminemia due to protein-calorie malnutrition (HCC)  Acute on chronic respiratory failure with hypoxia and hypercapnia possibly due to pneumonia Bilateral pleural effusion CT angiogram of the chest personally reviewed showed bilateral infiltrates in lower lobes which could be due to pneumonia Patient was empirically started on vancomycin, cefepime and Flagyl, we shall continue with vancomycin and cefepime  with plan to de-escalate/discontinue based on blood culture, sputum culture, urine Legionella, strep pneumo and procalcitonin Continue Tylenol as needed Continue Mucinex, incentive spirometry, flutter valve when patient is weaned off BiPAP Repeat ABG in the morning  Lactic  acidosis Lactic acid lactic acid 2.1 > 3.0; this may be due to hypoxia Continue to trend lactic acid  Hypomagnesemia Mg level is 1.6 This will be replenished Please continue to monitor Mg level and correct accordingly  Hyperglycemia secondary to a reactive process CBG 164, continue to monitor blood glucose level  Obesity-BMI 35.77kg/m Patient is bedridden Patient will be counseled on diet modification Patient will need to follow-up with outpatient PCP for weight loss program  Hypoalbuminemia possibly secondary to moderate protein calorie malnutrition Albumin 2.3, protein supplement to be provided  Factor V Leiden mutation, heterozygous Hypercoagulable state Continue Eliquis  COPD Continue albuterol  GERD Continue Protonix  Chronic pain syndrome Home opioids will be held at this time due to patient's current status  DVT prophylaxis: Eliquis  Code Status: Full code  Family Communication: None at bedside  Disposition Plan:  Patient is from:                        home Anticipated DC to:                   SNF or family members home Anticipated DC date:               2-3 days Anticipated DC barriers:          Patient requires inpatient management due to hypoxia requiring BiPAP  Consults called: None  Admission status: Inpatient    Bernadette Hoit MD [facility]  10/26/2021, 4:46 AM

## 2021-10-26 ENCOUNTER — Telehealth: Payer: Self-pay | Admitting: Family Medicine

## 2021-10-26 ENCOUNTER — Encounter (HOSPITAL_COMMUNITY): Payer: Self-pay | Admitting: Internal Medicine

## 2021-10-26 DIAGNOSIS — E46 Unspecified protein-calorie malnutrition: Secondary | ICD-10-CM

## 2021-10-26 DIAGNOSIS — E669 Obesity, unspecified: Secondary | ICD-10-CM

## 2021-10-26 DIAGNOSIS — E8809 Other disorders of plasma-protein metabolism, not elsewhere classified: Secondary | ICD-10-CM

## 2021-10-26 DIAGNOSIS — J90 Pleural effusion, not elsewhere classified: Secondary | ICD-10-CM

## 2021-10-26 DIAGNOSIS — J449 Chronic obstructive pulmonary disease, unspecified: Secondary | ICD-10-CM

## 2021-10-26 LAB — CBC
HCT: 45.6 % (ref 36.0–46.0)
Hemoglobin: 14.6 g/dL (ref 12.0–15.0)
MCH: 36.5 pg — ABNORMAL HIGH (ref 26.0–34.0)
MCHC: 32 g/dL (ref 30.0–36.0)
MCV: 114 fL — ABNORMAL HIGH (ref 80.0–100.0)
Platelets: 144 10*3/uL — ABNORMAL LOW (ref 150–400)
RBC: 4 MIL/uL (ref 3.87–5.11)
RDW: 13.5 % (ref 11.5–15.5)
WBC: 5.4 10*3/uL (ref 4.0–10.5)
nRBC: 0 % (ref 0.0–0.2)

## 2021-10-26 LAB — COMPREHENSIVE METABOLIC PANEL
ALT: 13 U/L (ref 0–44)
AST: 22 U/L (ref 15–41)
Albumin: 2.6 g/dL — ABNORMAL LOW (ref 3.5–5.0)
Alkaline Phosphatase: 59 U/L (ref 38–126)
Anion gap: 8 (ref 5–15)
BUN: 10 mg/dL (ref 6–20)
CO2: 29 mmol/L (ref 22–32)
Calcium: 8 mg/dL — ABNORMAL LOW (ref 8.9–10.3)
Chloride: 101 mmol/L (ref 98–111)
Creatinine, Ser: 0.55 mg/dL (ref 0.44–1.00)
GFR, Estimated: >60 mL/min (ref >60)
Glucose, Bld: 116 mg/dL — ABNORMAL HIGH (ref 70–99)
Potassium: 4.1 mmol/L (ref 3.5–5.1)
Sodium: 138 mmol/L (ref 135–145)
Total Bilirubin: 0.9 mg/dL (ref 0.3–1.2)
Total Protein: 5.8 g/dL — ABNORMAL LOW (ref 6.5–8.1)

## 2021-10-26 LAB — BLOOD CULTURE ID PANEL (REFLEXED) - BCID2

## 2021-10-26 LAB — PHOSPHORUS: Phosphorus: 2.7 mg/dL (ref 2.5–4.6)

## 2021-10-26 LAB — STREP PNEUMONIAE URINARY ANTIGEN: Strep Pneumo Urinary Antigen: NEGATIVE

## 2021-10-26 LAB — HIV ANTIBODY (ROUTINE TESTING W REFLEX): HIV Screen 4th Generation wRfx: NONREACTIVE

## 2021-10-26 LAB — MRSA NEXT GEN BY PCR, NASAL: MRSA by PCR Next Gen: NOT DETECTED

## 2021-10-26 LAB — PROCALCITONIN: Procalcitonin: 0.85 ng/mL

## 2021-10-26 LAB — LACTIC ACID, PLASMA
Lactic Acid, Venous: 1.8 mmol/L (ref 0.5–1.9)
Lactic Acid, Venous: 1.4 mmol/L (ref 0.5–1.9)

## 2021-10-26 LAB — MAGNESIUM: Magnesium: 1.6 mg/dL — ABNORMAL LOW (ref 1.7–2.4)

## 2021-10-26 LAB — TROPONIN I (HIGH SENSITIVITY): Troponin I (High Sensitivity): 4 ng/L (ref <18)

## 2021-10-26 MED ORDER — PANTOPRAZOLE SODIUM 40 MG PO TBEC
40.0000 mg | DELAYED_RELEASE_TABLET | Freq: Every day | ORAL | Status: DC
  Administered 2021-10-26 – 2021-10-27 (×2): 40 mg via ORAL
  Filled 2021-10-26 (×2): qty 1

## 2021-10-26 MED ORDER — ACETAMINOPHEN 325 MG PO TABS: 650.0000 mg | ORAL_TABLET | Freq: Four times a day (QID) | ORAL | Status: DC | PRN

## 2021-10-26 MED ORDER — ALBUTEROL SULFATE HFA 108 (90 BASE) MCG/ACT IN AERS: 2.0000 | INHALATION_SPRAY | Freq: Four times a day (QID) | RESPIRATORY_TRACT | Status: DC | PRN

## 2021-10-26 MED ORDER — DM-GUAIFENESIN ER 30-600 MG PO TB12
1.0000 | ORAL_TABLET | Freq: Two times a day (BID) | ORAL | Status: DC
  Administered 2021-10-26 – 2021-10-27 (×3): 1 via ORAL
  Filled 2021-10-26 (×3): qty 1

## 2021-10-26 MED ORDER — MAGNESIUM SULFATE 2 GM/50ML IV SOLN
2.0000 g | Freq: Once | INTRAVENOUS | Status: AC
  Administered 2021-10-26: 2 g via INTRAVENOUS
  Filled 2021-10-26: qty 50

## 2021-10-26 MED ORDER — LACTATED RINGERS IV SOLN: INTRAVENOUS | Status: DC

## 2021-10-26 MED ORDER — TRAZODONE HCL 50 MG PO TABS
100.0000 mg | ORAL_TABLET | Freq: Every day | ORAL | Status: DC
  Administered 2021-10-26: 100 mg via ORAL
  Filled 2021-10-26: qty 2

## 2021-10-26 MED ORDER — GLUCERNA SHAKE PO LIQD
237.0000 mL | Freq: Three times a day (TID) | ORAL | Status: DC
  Administered 2021-10-26: 237 mL via ORAL

## 2021-10-26 MED ORDER — OXYCODONE-ACETAMINOPHEN 5-325 MG PO TABS
1.0000 | ORAL_TABLET | Freq: Four times a day (QID) | ORAL | Status: DC | PRN
  Administered 2021-10-26 – 2021-10-27 (×4): 1 via ORAL
  Filled 2021-10-26 (×4): qty 1

## 2021-10-26 MED ORDER — APIXABAN 5 MG PO TABS
5.0000 mg | ORAL_TABLET | Freq: Two times a day (BID) | ORAL | Status: DC
  Administered 2021-10-26 – 2021-10-27 (×3): 5 mg via ORAL
  Filled 2021-10-26 (×3): qty 1

## 2021-10-26 MED ORDER — ENOXAPARIN SODIUM 40 MG/0.4ML IJ SOSY: 40.0000 mg | PREFILLED_SYRINGE | INTRAMUSCULAR | Status: DC

## 2021-10-26 MED ORDER — VANCOMYCIN HCL IN DEXTROSE 1-5 GM/200ML-% IV SOLN
1000.0000 mg | Freq: Two times a day (BID) | INTRAVENOUS | Status: DC
  Administered 2021-10-26 – 2021-10-27 (×2): 1000 mg via INTRAVENOUS
  Filled 2021-10-26 (×2): qty 200

## 2021-10-26 MED ORDER — CHLORHEXIDINE GLUCONATE CLOTH 2 % EX PADS
6.0000 | MEDICATED_PAD | Freq: Every day | CUTANEOUS | Status: DC
  Administered 2021-10-26 – 2021-10-27 (×2): 6 via TOPICAL

## 2021-10-26 MED ORDER — GABAPENTIN 300 MG PO CAPS
300.0000 mg | ORAL_CAPSULE | Freq: Three times a day (TID) | ORAL | Status: DC
  Administered 2021-10-26 – 2021-10-27 (×4): 300 mg via ORAL
  Filled 2021-10-26 (×4): qty 1

## 2021-10-26 NOTE — Progress Notes (Signed)
PHARMACY - PHYSICIAN COMMUNICATION CRITICAL VALUE ALERT - BLOOD CULTURE IDENTIFICATION (BCID)  [name] is an 56 y.o. female who presented to [facility] on 10/25/2021 with a chief complaint of fever  Assessment:  56 y.o. female with medical history significant for factor V Leiden mutation, primary hypercoagulable state, major depression, COPD, chronic pain syndrome who presents to the emergency department via EMS due to fever of 103F and O2 sat at home was 75% on room air, BP was low at 80s/50s . Acute respiratory failure with hypoxia. CTA chest showed no evidence of pulmonary embolism, but showed trace bilateral pleural effusions with bilateral lower lung atelectasis. 1 of 4 BCX + staph species. D/w MD and will continue Vancomycin for now. MRSA PCR is negative . Unsure of source.  Name of physician (or Provider) Contacted: Dr. Wynetta Emery  Current antibiotics: Cefepime and Vancomycin  Changes to prescribed antibiotics recommended:  Continue Vancomycin and cefepime  Results for orders placed or performed during the hospital encounter of 10/25/21  Blood Culture ID Panel (Reflexed) (Collected: 10/25/2021  3:02 PM)  Result Value Ref Range   Enterococcus faecalis NOT DETECTED NOT DETECTED   Enterococcus Faecium NOT DETECTED NOT DETECTED   Listeria monocytogenes NOT DETECTED NOT DETECTED   Staphylococcus species DETECTED (A) NOT DETECTED   Staphylococcus aureus (BCID) NOT DETECTED NOT DETECTED   Staphylococcus epidermidis NOT DETECTED NOT DETECTED   Staphylococcus lugdunensis NOT DETECTED NOT DETECTED   Streptococcus species NOT DETECTED NOT DETECTED   Streptococcus agalactiae NOT DETECTED NOT DETECTED   Streptococcus pneumoniae NOT DETECTED NOT DETECTED   Streptococcus pyogenes NOT DETECTED NOT DETECTED   A.calcoaceticus-baumannii NOT DETECTED NOT DETECTED   Bacteroides fragilis NOT DETECTED NOT DETECTED   Enterobacterales NOT DETECTED NOT DETECTED   Enterobacter cloacae complex NOT  DETECTED NOT DETECTED   Escherichia coli NOT DETECTED NOT DETECTED   Klebsiella aerogenes NOT DETECTED NOT DETECTED   Klebsiella oxytoca NOT DETECTED NOT DETECTED   Klebsiella pneumoniae NOT DETECTED NOT DETECTED   Proteus species NOT DETECTED NOT DETECTED   Salmonella species NOT DETECTED NOT DETECTED   Serratia marcescens NOT DETECTED NOT DETECTED   Haemophilus influenzae NOT DETECTED NOT DETECTED   Neisseria meningitidis NOT DETECTED NOT DETECTED   Pseudomonas aeruginosa NOT DETECTED NOT DETECTED   Stenotrophomonas maltophilia NOT DETECTED NOT DETECTED   Candida albicans NOT DETECTED NOT DETECTED   Candida auris NOT DETECTED NOT DETECTED   Candida glabrata NOT DETECTED NOT DETECTED   Candida krusei NOT DETECTED NOT DETECTED   Candida parapsilosis NOT DETECTED NOT DETECTED   Candida tropicalis NOT DETECTED NOT DETECTED   Cryptococcus neoformans/gattii NOT DETECTED NOT DETECTED    Isac Sarna, BS Vena [place], BCPS Clinical Pharmacist Pager [phone] 10/26/2021  2:47 PM

## 2021-10-26 NOTE — Progress Notes (Signed)
Pt transported from ED 10 to ICU 12 on BIPAP with no complications tolerated well. Report past to ICU therapist

## 2021-10-26 NOTE — Progress Notes (Signed)
PROGRESS NOTE   [name]  JXB:[id number] DOB: [birth year] DOA: 10/25/2021 PCP: Kathyrn Drown, MD   Chief Complaint  Patient presents with   Fever   Level of care: Telemetry  Brief Admission History:  56 y.o. female with medical history significant for factor V Leiden mutation, primary hypercoagulable state, major depression, COPD, chronic pain syndrome who presents to the emergency department via EMS due to fever of 103F and O2 sat at home was 75% on room air, BP was low at 80s/50s, so her caretaker activated EMS (patient is bedridden at baseline).  On arrival of EMS team, patient was placed on supplemental oxygen at 4 LPM, she was lethargic en route to the ED.  There was no report of vomiting, diarrhea, constipation, headache or any sick contacts.     In the Emergency Department, she was initially bradycardic, BP was 105/67, ABG showed hypoxia and hypercapnia, patient was placed on BiPAP and O2 sat was 100%.  Work-up in the ED showed macrocytic anemia, hyperglycemia, lactic acid 2.1 > 3.0.  Magnesium 1.6, urinalysis was unimpressive for UTI, BNP 66, lipase 34, troponin was within normal range.  Influenza A, B, SARS coronavirus 2 was negative.  CTA chest showed no evidence of pulmonary embolism, but showed trace bilateral pleural effusions with bilateral lower lung atelectasis.  CT head without contrast showed no acute intracranial process.   Chest x-ray showed no acute intrathoracic process identified.  She was treated with IV cefepime Flagyl and vancomycin.  IV hydration was provided.  Tylenol was given.  Hospitalist was asked to admit patient for further evaluation and management.  Assessment & Plan:   Principal Problem:   Acute on chronic respiratory failure with hypoxia and hypercapnia (HCC) Active Problems:   Hyperglycemia   Factor V Leiden mutation, heterozygous (HCC)   Lactic acidosis   GERD (gastroesophageal reflux disease)   Bilateral pleural effusion   Obesity (BMI  30-39.9)   Hypoalbuminemia due to protein-calorie malnutrition (HCC)   COPD (chronic obstructive pulmonary disease) (HCC)   Acute respiratory failure with hypoxia - secondary to bilateral pneumonia  - likely exacerbated by high dose opioid usage - now off bipap, de-escalate antibiotics - transfer out of stepdown ICU to telemetry  - careful with opioids - PT evaluation for ambulation   Lactic acidosis  - resolved  Sepsis has been ruled out   COPD  - continue current treatments and follow up   GERD  - protonix ordered for GI protection   Hypomagnesemia Mg level is 1.6 This will be replenished Recheck in AM    Obesity-BMI 35.77kg/m Patient is bedridden Patient will be counseled on diet modification Patient will need to follow-up with outpatient PCP for weight loss program   Hypoalbuminemia possibly secondary to moderate protein calorie malnutrition Albumin 2.3, protein supplement to be provided   Factor V Leiden mutation, heterozygous Hypercoagulable state Continue Eliquis  Chronic pain/ opioid dependence - resumed home percocet 5 mg tablets now that off bipap   DVT prophylaxis: apixaban  Code Status: full  Family Communication: none present  Disposition: TBD  Status is: Inpatient  Remains inpatient appropriate because: IV treatments required    Consultants:    Procedures:    Antimicrobials:     Subjective: Pt reports she hurts all over and requesting pain medication be restarted   Objective: Vitals:   10/26/21 0944 10/26/21 1000 10/26/21 1100 10/26/21 1200  BP:  122/68 (!) 108/55 112/77  Pulse: 84 86 76 76  Resp: (!) 27 17  19 20  Temp:      TempSrc:      SpO2: 98% 98% 95% 95%  Weight:      Height:        Intake/Output Summary (Last 24 hours) at 10/26/2021 1257 Last data filed at 10/26/2021 0845 Gross per 24 hour  Intake 3416.95 ml  Output 1400 ml  Net 2016.95 ml   Filed Weights   10/25/21 1458  Weight: 88.7 kg     Examination:  General exam: Appears calm and comfortable  Respiratory system: Clear to auscultation. Respiratory effort normal. Cardiovascular system: normal S1 & S2 heard. No JVD, murmurs, rubs, gallops or clicks. No pedal edema. Gastrointestinal system: Abdomen is nondistended, soft and nontender. No organomegaly or masses felt. Normal bowel sounds heard. Central nervous system: Alert and oriented. No focal neurological deficits. Extremities: Symmetric 5 x 5 power. Skin: No rashes, lesions or ulcers Psychiatry: Judgement and insight appear normal. Mood & affect appropriate.   Data Reviewed: I have personally reviewed following labs and imaging studies  CBC: Recent Labs  Lab 10/25/21 1515 10/26/21 0050  WBC 5.6 5.4  NEUTROABS 4.3  --   HGB 15.3* 14.6  HCT 47.9* 45.6  MCV 116.5* 114.0*  PLT 159 144*    Basic Metabolic Panel: Recent Labs  Lab 10/25/21 1515 10/26/21 0050  NA 138 138  K 4.3 4.1  CL 99 101  CO2 32 29  GLUCOSE 164* 116*  BUN 11 10  CREATININE 0.88 0.55  CALCIUM 8.3* 8.0*  MG 1.8 1.6*  PHOS  --  2.7    GFR: Estimated Creatinine Clearance: 81.2 mL/min (by C-G formula based on SCr of 0.55 mg/dL).  Liver Function Tests: Recent Labs  Lab 10/25/21 1515 10/26/21 0050  AST 23 22  ALT 13 13  ALKPHOS 66 59  BILITOT 0.9 0.9  PROT 5.9* 5.8*  ALBUMIN 2.7* 2.6*    CBG: No results for input(s): GLUCAP in the last 168 hours.  Recent Results (from the past 240 hour(s))  Blood Culture (routine x 2)     Status: None (Preliminary result)   Collection Time: 10/25/21  3:02 PM   Specimen: BLOOD RIGHT FOREARM  Result Value Ref Range Status   Specimen Description BLOOD RIGHT FOREARM  Final   Special Requests   Final    BOTTLES DRAWN AEROBIC AND ANAEROBIC Blood Culture adequate volume   Culture  Setup Time   Final    AEROBIC BOTTLE ONLY GRAM POSITIVE COCCI IN CLUSTERS RESULTS CALLED TO [place], A. AT 1610 ON 10/25/21 BY BROWNING, D.    Culture   Final     NO GROWTH < 24 HOURS Performed at [facility], [address], [place], [place] [address]    Report Status PENDING  Incomplete  Blood Culture (routine x 2)     Status: None (Preliminary result)   Collection Time: 10/25/21  3:07 PM   Specimen: BLOOD RIGHT ARM  Result Value Ref Range Status   Specimen Description BLOOD RIGHT ARM  Final   Special Requests   Final    BOTTLES DRAWN AEROBIC AND ANAEROBIC Blood Culture results may not be optimal due to an excessive volume of blood received in culture bottles   Culture   Final    NO GROWTH < 24 HOURS Performed at [facility], [address], [place], [place] [address]    Report Status PENDING  Incomplete  Resp Panel by RT-PCR (Flu A&B, Covid) Nasopharyngeal Swab     Status: None  Collection Time: 10/25/21  5:11 PM   Specimen: Nasopharyngeal Swab; Nasopharyngeal(NP) swabs in vial transport medium  Result Value Ref Range Status   SARS Coronavirus 2 by RT PCR NEGATIVE NEGATIVE Final    Comment: (NOTE) SARS-CoV-2 target nucleic acids are NOT DETECTED.  The SARS-CoV-2 RNA is generally detectable in upper respiratory specimens during the acute phase of infection. The lowest concentration of SARS-CoV-2 viral copies this assay can detect is 138 copies/mL. A negative result does not preclude SARS-Cov-2 infection and should not be used as the sole basis for treatment or other patient management decisions. A negative result may occur with  improper specimen collection/handling, submission of specimen other than nasopharyngeal swab, presence of viral mutation(s) within the areas targeted by this assay, and inadequate number of viral copies(<138 copies/mL). A negative result must be combined with clinical observations, patient history, and epidemiological information. The expected result is Negative.  Fact Sheet for Patients:  EntrepreneurPulse.com.au  Fact Sheet for Healthcare Providers:   IncredibleEmployment.be  This test is no t yet approved or cleared by the [place] FDA and  has been authorized for detection and/or diagnosis of SARS-CoV-2 by FDA under an Emergency Use Authorization (EUA). This EUA will remain  in effect (meaning this test can be used) for the duration of the COVID-19 declaration under Section 564(b)(1) of the Act, 21 U.S.C.section 360bbb-3(b)(1), unless the authorization is terminated  or revoked sooner.       Influenza A by PCR NEGATIVE NEGATIVE Final   Influenza B by PCR NEGATIVE NEGATIVE Final    Comment: (NOTE) The Xpert Xpress SARS-CoV-2/FLU/RSV plus assay is intended as an aid in the diagnosis of influenza from Nasopharyngeal swab specimens and should not be used as a sole basis for treatment. Nasal washings and aspirates are unacceptable for Xpert Xpress SARS-CoV-2/FLU/RSV testing.  Fact Sheet for Patients: EntrepreneurPulse.com.au  Fact Sheet for Healthcare Providers: IncredibleEmployment.be  This test is not yet approved or cleared by the [place] FDA and has been authorized for detection and/or diagnosis of SARS-CoV-2 by FDA under an Emergency Use Authorization (EUA). This EUA will remain in effect (meaning this test can be used) for the duration of the COVID-19 declaration under Section 564(b)(1) of the Act, 21 U.S.C. section 360bbb-3(b)(1), unless the authorization is terminated or revoked.  Performed at [facility], [address] [place], [place] [address]   MRSA Next Gen by PCR, Nasal     Status: None   Collection Time: 10/26/21  1:58 AM   Specimen: Nasal Mucosa; Nasal Swab  Result Value Ref Range Status   MRSA by PCR Next Gen NOT DETECTED NOT DETECTED Final    Comment: (NOTE) The GeneXpert MRSA Assay (FDA approved for NASAL specimens only), is one component of a comprehensive MRSA colonization surveillance program. It is not intended to diagnose MRSA  infection nor to guide or monitor treatment for MRSA infections. Test performance is not FDA approved in patients less than 41 years old. Performed at [facility], [address], [place], [place] [address]      Radiology Studies: CT Head Wo Contrast  Result Date: 10/25/2021 CLINICAL DATA:  Mental status change.  Fever.  Lethargy. EXAM: CT HEAD WITHOUT CONTRAST TECHNIQUE: Contiguous axial images were obtained from the base of the skull through the vertex without intravenous contrast. RADIATION DOSE REDUCTION: This exam was performed according to the departmental dose-optimization program which includes automated exposure control, adjustment of the mA and/or kV according to patient size and/or use of iterative reconstruction  technique. COMPARISON:  CT brain 10/12/2016 FINDINGS: Brain: There is mild cortical atrophy, unchanged from prior within normal limits for patient age. The ventricles are normal in configuration. The basilar cisterns are patent. No mass, mass effect, or midline shift. Symmetric globus pallidus focal hypodensities are unchanged, possible remote lacunar infarcts. No acute intracranial hemorrhage is seen. No abnormal extra-axial fluid collection. Mild periventricular white matter hypodensities, similar to prior. This is nonspecific but most likely secondary to chronic ischemic white matter change. Preservation of the normal cortical gray-white interface without CT evidence of an acute major vascular territorial cortical based infarction. Vascular: No hyperdense vessel or unexpected calcification. Skull: Normal. Negative for fracture or focal lesion. Sinuses/Orbits: Bilateral intra-ocular lens replacements are noted. Minimal mucosal opacification of the posterior aspect of the bilateral partially visualized maxillary sinuses. The visualized mastoid air cells are clear. Other: None. IMPRESSION: 1. Mild cortical atrophy and mild chronic ischemic white matter changes, unchanged from prior. 2.  No acute intracranial process. Electronically Signed   By: Yvonne Kendall M.D.   On: 10/25/2021 15:50   CT Angio Chest PE W and/or Wo Contrast  Result Date: 10/25/2021 CLINICAL DATA:  Sepsis, fever, lethargy. EXAM: CT ANGIOGRAPHY CHEST CT ABDOMEN AND PELVIS WITH CONTRAST TECHNIQUE: Multidetector CT imaging of the chest was performed using the standard protocol during bolus administration of intravenous contrast. Multiplanar CT image reconstructions and MIPs were obtained to evaluate the vascular anatomy. Multidetector CT imaging of the abdomen and pelvis was performed using the standard protocol during bolus administration of intravenous contrast. RADIATION DOSE REDUCTION: This exam was performed according to the departmental dose-optimization program which includes automated exposure control, adjustment of the mA and/or kV according to patient size and/or use of iterative reconstruction technique. CONTRAST:  152mL OMNIPAQUE IOHEXOL 350 MG/ML SOLN COMPARISON:  CT chest 08/27/2020.  CT abdomen and pelvis 10/09/2014. FINDINGS: CTA CHEST FINDINGS Cardiovascular: There is suboptimal opacification of the pulmonary artery secondary to timing of the contrast bolus. There is no evidence for pulmonary embolism to the lobar level. The heart is mildly enlarged, unchanged. There is no pericardial effusion. Aorta is normal in size. Mediastinum/Nodes: No enlarged mediastinal, hilar, or axillary lymph nodes. Thyroid gland, trachea, and esophagus demonstrate no significant findings. Lungs/Pleura: There is atelectasis in the bilateral lower lobes. There are trace bilateral pleural effusions. There is no pneumothorax. Trachea and central airways are patent. Musculoskeletal: Cervical spinal fusion plate is partially visualized. No acute fractures are seen. Review of the MIP images confirms the above findings. CT ABDOMEN and PELVIS FINDINGS Hepatobiliary: There is diffuse fatty infiltration of the liver. No focal liver lesions are  identified. Gallbladder appears within normal limits. No definite biliary ductal dilatation. Pancreas: Unremarkable. No pancreatic ductal dilatation or surrounding inflammatory changes. Spleen: Normal in size without focal abnormality. Adrenals/Urinary Tract: Adrenal glands are unremarkable. Kidneys are normal, without renal calculi, focal lesion, or hydronephrosis. Bladder is unremarkable. Stomach/Bowel: Stomach is within normal limits. No evidence of bowel wall thickening, distention, or inflammatory changes. The appendix is not seen. Vascular/Lymphatic: Aortic atherosclerosis. No enlarged abdominal or pelvic lymph nodes. There are some prominent inguinal lymph nodes bilaterally. Reproductive: Status post hysterectomy. No adnexal masses. Other: No abdominal wall hernia or abnormality. No abdominopelvic ascites. 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 No acute fractures. Review of the MIP images confirms the above findings. IMPRESSION: 1. Limited evaluation for pulmonary embolism secondary to timing of the contrast bolus. No evidence for pulmonary embolism to the lobar level. 2. Trace bilateral pleural effusions  with bilateral lower lung atelectasis. 3. Cardiomegaly. 4. No acute localizing process in the abdomen or pelvis. 5. Fatty infiltration of the liver. 6.  Aortic Atherosclerosis (ICD10-I70.0). Electronically Signed   By: Ronney Asters M.D.   On: 10/25/2021 20:34   CT ABDOMEN PELVIS W CONTRAST  Result Date: 10/25/2021 CLINICAL DATA:  Sepsis, fever, lethargy. EXAM: CT ANGIOGRAPHY CHEST CT ABDOMEN AND PELVIS WITH CONTRAST TECHNIQUE: Multidetector CT imaging of the chest was performed using the standard protocol during bolus administration of intravenous contrast. Multiplanar CT image reconstructions and MIPs were obtained to evaluate the vascular anatomy. Multidetector CT imaging of the abdomen and pelvis was performed using the standard protocol during bolus administration of  intravenous contrast. RADIATION DOSE REDUCTION: This exam was performed according to the departmental dose-optimization program which includes automated exposure control, adjustment of the mA and/or kV according to patient size and/or use of iterative reconstruction technique. CONTRAST:  174mL OMNIPAQUE IOHEXOL 350 MG/ML SOLN COMPARISON:  CT chest 08/27/2020.  CT abdomen and pelvis 10/09/2014. FINDINGS: CTA CHEST FINDINGS Cardiovascular: There is suboptimal opacification of the pulmonary artery secondary to timing of the contrast bolus. There is no evidence for pulmonary embolism to the lobar level. The heart is mildly enlarged, unchanged. There is no pericardial effusion. Aorta is normal in size. Mediastinum/Nodes: No enlarged mediastinal, hilar, or axillary lymph nodes. Thyroid gland, trachea, and esophagus demonstrate no significant findings. Lungs/Pleura: There is atelectasis in the bilateral lower lobes. There are trace bilateral pleural effusions. There is no pneumothorax. Trachea and central airways are patent. Musculoskeletal: Cervical spinal fusion plate is partially visualized. No acute fractures are seen. Review of the MIP images confirms the above findings. CT ABDOMEN and PELVIS FINDINGS Hepatobiliary: There is diffuse fatty infiltration of the liver. No focal liver lesions are identified. Gallbladder appears within normal limits. No definite biliary ductal dilatation. Pancreas: Unremarkable. No pancreatic ductal dilatation or surrounding inflammatory changes. Spleen: Normal in size without focal abnormality. Adrenals/Urinary Tract: Adrenal glands are unremarkable. Kidneys are normal, without renal calculi, focal lesion, or hydronephrosis. Bladder is unremarkable. Stomach/Bowel: Stomach is within normal limits. No evidence of bowel wall thickening, distention, or inflammatory changes. The appendix is not seen. Vascular/Lymphatic: Aortic atherosclerosis. No enlarged abdominal or pelvic lymph nodes. There  are some prominent inguinal lymph nodes bilaterally. Reproductive: Status post hysterectomy. No adnexal masses. Other: No abdominal wall hernia or abnormality. No abdominopelvic ascites. 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 No acute fractures. Review of the MIP images confirms the above findings. IMPRESSION: 1. Limited evaluation for pulmonary embolism secondary to timing of the contrast bolus. No evidence for pulmonary embolism to the lobar level. 2. Trace bilateral pleural effusions with bilateral lower lung atelectasis. 3. Cardiomegaly. 4. No acute localizing process in the abdomen or pelvis. 5. Fatty infiltration of the liver. 6.  Aortic Atherosclerosis (ICD10-I70.0). Electronically Signed   By: Ronney Asters M.D.   On: 10/25/2021 20:34   DG Chest Port 1 View  Result Date: 10/25/2021 CLINICAL DATA:  Sepsis EXAM: PORTABLE CHEST 1 VIEW COMPARISON:  Chest x-ray 08/26/2020 FINDINGS: Heart size and mediastinal contours are within normal limits. Faint residual opacity in the left lower lung zone since previous study may represent scarring. No suspicious consolidation identified. No pleural effusion or pneumothorax visualized. No acute osseous abnormality appreciated. IMPRESSION: No acute intrathoracic process identified. Electronically Signed   By: Ofilia Neas M.D.   On: 10/25/2021 15:25    Scheduled Meds:  apixaban  5 mg Oral BID   Chlorhexidine  Gluconate Cloth  6 each Topical Daily   dextromethorphan-guaiFENesin  1 tablet Oral BID   feeding supplement (GLUCERNA SHAKE)  237 mL Oral TID BM   gabapentin  300 mg Oral TID   pantoprazole  40 mg Oral Daily   traZODone  100 mg Oral QHS   Continuous Infusions:  ceFEPime (MAXIPIME) IV 2 g (10/26/21 0533)   lactated ringers 50 mL/hr at 10/26/21 1138    LOS: 1 day   Time spent: 58 mins   Sumedha Munnerlyn Wynetta Emery, MD How to contact the [facility] Attending or Consulting provider [place] or covering provider during after hours [place], for this patient?  Check the care team in [facility] and look for a) attending/consulting [facility] provider listed and b) the [facility] team listed Log into [email or web address] and use 's universal password to access. If you do not have the password, please contact the hospital operator. Locate the [facility] provider you are looking for under [facility] and page to a number that you can be directly reached. If you still have difficulty reaching the provider, please page the [facility] (Director on Call) for the Hospitalists listed on amion for assistance.  10/26/2021, 12:57 PM

## 2021-10-26 NOTE — Telephone Encounter (Signed)
error 

## 2021-10-26 NOTE — Progress Notes (Signed)
Report on patient given to receiving nurse [place], patient transferred to room 310

## 2021-10-27 ENCOUNTER — Telehealth: Payer: Self-pay | Admitting: Family Medicine

## 2021-10-27 LAB — BASIC METABOLIC PANEL
Anion gap: 6 (ref 5–15)
BUN: 9 mg/dL (ref 6–20)
CO2: 29 mmol/L (ref 22–32)
Calcium: 7.5 mg/dL — ABNORMAL LOW (ref 8.9–10.3)
Chloride: 106 mmol/L (ref 98–111)
Creatinine, Ser: 0.65 mg/dL (ref 0.44–1.00)
GFR, Estimated: >60 mL/min (ref >60)
Glucose, Bld: 140 mg/dL — ABNORMAL HIGH (ref 70–99)
Potassium: 3.9 mmol/L (ref 3.5–5.1)
Sodium: 141 mmol/L (ref 135–145)

## 2021-10-27 LAB — LEGIONELLA PNEUMOPHILA SEROGP 1 UR AG: L. pneumophila Serogp 1 Ur Ag: NEGATIVE

## 2021-10-27 LAB — CULTURE, BLOOD (ROUTINE X 2): Special Requests: ADEQUATE

## 2021-10-27 LAB — URINE CULTURE: Culture: NO GROWTH

## 2021-10-27 LAB — MAGNESIUM: Magnesium: 2.3 mg/dL (ref 1.7–2.4)

## 2021-10-27 MED ORDER — DOXYCYCLINE HYCLATE 100 MG PO CAPS: 100.0000 mg | ORAL_CAPSULE | Freq: Two times a day (BID) | ORAL | 0 refills | Status: AC

## 2021-10-27 MED ORDER — POTASSIUM CHLORIDE ER 10 MEQ PO TBCR: 10.0000 meq | EXTENDED_RELEASE_TABLET | Freq: Two times a day (BID) | ORAL | Status: AC

## 2021-10-27 MED ORDER — SODIUM CHLORIDE 0.9 % IV SOLN
2.0000 g | INTRAVENOUS | Status: DC
  Administered 2021-10-27: 2 g via INTRAVENOUS
  Filled 2021-10-27: qty 20

## 2021-10-27 MED ORDER — SODIUM CHLORIDE 0.9 % IV SOLN
500.0000 mg | INTRAVENOUS | Status: DC
  Administered 2021-10-27: 500 mg via INTRAVENOUS
  Filled 2021-10-27: qty 5

## 2021-10-27 NOTE — Discharge Instructions (Signed)
PLEASE FOLLOW UP WITH DR. [name] IN 1-2 WEEKS PLEASE HAVE A FOLLOW UP CHEST XRAY DONE IN 1 MONTH   IMPORTANT INFORMATION: PAY CLOSE ATTENTION   PHYSICIAN DISCHARGE INSTRUCTIONS  Follow with Primary care provider  Kathyrn Drown, MD  and other consultants as instructed by your Hospitalist Physician  [place] IF SYMPTOMS COME BACK, WORSEN OR NEW PROBLEM DEVELOPS   Please note: You were cared for by a hospitalist during your hospital stay. Every effort will be made to forward records to your primary care provider.  You can request that your primary care provider send for your hospital records if they have not received them.  Once you are discharged, your primary care physician will handle any further medical issues. Please note that NO REFILLS for any discharge medications will be authorized once you are discharged, as it is imperative that you return to your primary care physician (or establish a relationship with a primary care physician if you do not have one) for your post hospital discharge needs so that they can reassess your need for medications and monitor your lab values.  Please get a complete blood count and chemistry panel checked by your Primary MD at your next visit, and again as instructed by your Primary MD.  Get Medicines reviewed and adjusted: Please take all your medications with you for your next visit with your Primary MD  Laboratory/radiological data: Please request your Primary MD to go over all hospital tests and procedure/radiological results at the follow up, please ask your primary care provider to get all Hospital records sent to his/her office.  In some cases, they will be blood work, cultures and biopsy results pending at the time of your discharge. Please request that your primary care provider follow up on these results.  If you are diabetic, please bring your blood sugar readings with you to your follow up appointment with  primary care.    Please call and make your follow up appointments as soon as possible.    Also Note the following: If you experience worsening of your admission symptoms, develop shortness of breath, life threatening emergency, suicidal or homicidal thoughts you must seek medical attention immediately by calling 911 or calling your MD immediately  if symptoms less severe.  You must read complete instructions/literature along with all the possible adverse reactions/side effects for all the Medicines you take and that have been prescribed to you. Take any new Medicines after you have completely understood and accpet all the possible adverse reactions/side effects.   Do not drive when taking Pain medications or sleeping medications (Benzodiazepines)  Do not take more than prescribed Pain, Sleep and Anxiety Medications. It is not advisable to combine anxiety,sleep and pain medications without talking with your primary care practitioner  Special Instructions: If you have smoked or chewed Tobacco  in the last 2 yrs please stop smoking, stop any regular Alcohol  and or any Recreational drug use.  Wear Seat belts while driving.  Do not drive if taking any narcotic, mind altering or controlled substances or recreational drugs or alcohol.

## 2021-10-27 NOTE — Discharge Summary (Signed)
Physician Discharge Summary  [name] IRJ:[id number] DOB: [birth year] DOA: 10/25/2021  PCP: Kathyrn Drown, MD  Admit date: 10/25/2021 Discharge date: 10/27/2021  Admitted From:  HOME  Disposition: HOME   Recommendations for Outpatient Follow-up:  Follow up with PCP in 1 weeks Please have follow up chest xray done in 4-6 weeks to ensure resolution of pneumonia   Discharge Condition: STABLE   CODE STATUS: FULL DIET: resume prior home diet    Brief Hospitalization Summary: Please see all hospital notes, images, labs for full details of the hospitalization. ADMISSION HPI:  [name] is a 56 y.o. female with medical history significant for factor V Leiden mutation, primary hypercoagulable state, major depression, COPD, chronic pain syndrome who presents to the emergency department via EMS due to fever of 103F and O2 sat at home was 75% on room air, BP was low at 80s/50s, so her caretaker activated EMS (patient is bedridden at baseline).  On arrival of EMS team, patient was placed on supplemental oxygen at 4 LPM, she was lethargic en route to the ED.  There was no report of vomiting, diarrhea, constipation, headache or any sick contacts   ED Course:  In the Emergency Department, she was initially bradycardic, BP was 105/67, ABG showed hypoxia and hypercapnia, patient was placed on BiPAP and O2 sat was 100%.  Work-up in the ED showed macrocytic anemia, hyperglycemia, lactic acid 2.1 > 3.0.  Magnesium 1.6, urinalysis was unimpressive for UTI, BNP 66, lipase 34, troponin was within normal range.  Influenza A, B, SARS coronavirus 2 was negative. CTA chest showed no evidence of pulmonary embolism, but showed trace bilateral pleural effusions with bilateral lower lung atelectasis.  CT head without contrast showed no acute intracranial process.  Chest x-ray showed no acute intrathoracic process identified.  She was treated with IV cefepime Flagyl and vancomycin.  IV hydration was provided.  Tylenol was  given.  Hospitalist was asked to admit patient for further evaluation and management.    HOSPITAL COURSE Patient was essentially admitted into the hospital with a community-acquired pneumonia.  Sepsis has been ruled out.  She was treated with broad-spectrum IV antibiotics initially but then subsequently de-escalated after culture results are available.  1/4 blood cultures tested positive for Staphylococcus which was thought to be secondary to a contaminated specimen.  I discussed with Pharm.D. and ID pharmacist and vancomycin was discontinued.  Patient clinically improved significantly after a brief period of BiPAP therapy.  She did not tolerate CPAP.  She tolerated IV antibiotics well and subsequently will be transition to oral antibiotics to complete full course of treatment for community-acquired pneumonia.  She was hydrated with IV fluids and tolerated that very well.  She has been weaned to her baseline oxygen requirements feels well to go home today.  Discharge Diagnoses:  Principal Problem:   Acute on chronic respiratory failure with hypoxia and hypercapnia (HCC) Active Problems:   Hyperglycemia   Factor V Leiden mutation, heterozygous (HCC)   Lactic acidosis   GERD (gastroesophageal reflux disease)   Bilateral pleural effusion   Obesity (BMI 30-39.9)   Hypoalbuminemia due to protein-calorie malnutrition (HCC)   COPD (chronic obstructive pulmonary disease) ([place])   Discharge Instructions:  Allergies as of 10/27/2021       Reactions   Ambien [zolpidem Tartrate] Other (See Comments)   Crazy dreams   Ceftin [cefuroxime Axetil] Nausea And Vomiting   Pt tolerated Cefepime 09/2014 admission   Hctz [hydrochlorothiazide] Other (See Comments)   Urinary retention  Lodine [etodolac] Hives   Promethazine Other (See Comments)   Hallucinations.    Codeine Rash   Latex Rash   Penicillins Rash        Medication List     TAKE these medications    Accu-Chek FastClix Lancets Misc USE  TO TEST ONCE DAILY. What changed: See the new instructions.   albuterol 108 (90 Base) MCG/ACT inhaler Commonly known as: VENTOLIN HFA INHALE 2 PUFFS EVERY 6 HOURS AS NEEDED.   apixaban 5 MG Tabs tablet Commonly known as: Eliquis 1 bid   blood glucose meter kit and supplies Dispense based on patient and insurance preference. Use to check blood sugar once daily. (FOR ICD-10 E11.9).   blood glucose meter kit and supplies Kit Dispense based on patient and insurance preference. May test once a day due to prediabetes   CALCIUM 600 PO Take 2 tablets by mouth daily.   doxycycline 100 MG capsule Commonly known as: VIBRAMYCIN Take 1 capsule (100 mg total) by mouth 2 (two) times daily for 4 days.   gabapentin 300 MG capsule Commonly known as: NEURONTIN TAKE 1 CAPSULE THREE TIMES DAILY What changed: See the new instructions.   glucose blood test strip Commonly known as: ONE TOUCH ULTRA TEST USE TO TEST ONCE DAILY. What changed:  how much to take how to take this when to take this additional instructions   naloxone 4 MG/0.1ML Liqd nasal spray kit Commonly known as: NARCAN Use as directed What changed:  how much to take how to take this when to take this   oxyCODONE-acetaminophen 5-325 MG tablet Commonly known as: PERCOCET/ROXICET Take 1 tablet po every 4 hours as needed pain maximum 6/day   pantoprazole 40 MG tablet Commonly known as: PROTONIX TAKE 1 TABLET BY MOUTH ONCE DAILY.   potassium chloride 10 MEQ tablet Commonly known as: KLOR-CON Take 1 tablet (10 mEq total) by mouth 2 (two) times daily.   thiamine 100 MG tablet Commonly known as: Vitamin B-1 Take 1 tablet (100 mg total) by mouth daily.   traZODone 100 MG tablet Commonly known as: DESYREL TAKE 1 AND A HALF TABLET BY MOUTH AT BEDTIME. What changed: See the new instructions.   vitamin B-12 1000 MCG tablet Commonly known as: CYANOCOBALAMIN Take 1,000 mcg by mouth daily.        Follow-up Information      [name], Elayne Snare, MD. Schedule an appointment as soon as possible for a visit in 1 week(s).   Specialty: Family Medicine Why: Hospital Follow Up Contact information: [place] [address] [phone]         Arnoldo Lenis, MD .   Specialty: Cardiology Contact information: [place] [address] [phone]                Allergies  Allergen Reactions   Ambien [Zolpidem Tartrate] Other (See Comments)    Crazy dreams   Ceftin [Cefuroxime Axetil] Nausea And Vomiting    Pt tolerated Cefepime 09/2014 admission   Hctz [Hydrochlorothiazide] Other (See Comments)    Urinary retention   Lodine [Etodolac] Hives   Promethazine Other (See Comments)    Hallucinations.    Codeine Rash   Latex Rash   Penicillins Rash   Allergies as of 10/27/2021       Reactions   Ambien [zolpidem Tartrate] Other (See Comments)   Crazy dreams   Ceftin [cefuroxime Axetil] Nausea And Vomiting   Pt tolerated Cefepime 09/2014 admission  Hctz [hydrochlorothiazide] Other (See Comments)   Urinary retention   Lodine [etodolac] Hives   Promethazine Other (See Comments)   Hallucinations.    Codeine Rash   Latex Rash   Penicillins Rash        Medication List     TAKE these medications    Accu-Chek FastClix Lancets Misc USE TO TEST ONCE DAILY. What changed: See the new instructions.   albuterol 108 (90 Base) MCG/ACT inhaler Commonly known as: VENTOLIN HFA INHALE 2 PUFFS EVERY 6 HOURS AS NEEDED.   apixaban 5 MG Tabs tablet Commonly known as: Eliquis 1 bid   blood glucose meter kit and supplies Dispense based on patient and insurance preference. Use to check blood sugar once daily. (FOR ICD-10 E11.9).   blood glucose meter kit and supplies Kit Dispense based on patient and insurance preference. May test once a day due to prediabetes   CALCIUM 600 PO Take 2 tablets by mouth daily.   doxycycline 100 MG capsule Commonly known as:  VIBRAMYCIN Take 1 capsule (100 mg total) by mouth 2 (two) times daily for 4 days.   gabapentin 300 MG capsule Commonly known as: NEURONTIN TAKE 1 CAPSULE THREE TIMES DAILY What changed: See the new instructions.   glucose blood test strip Commonly known as: ONE TOUCH ULTRA TEST USE TO TEST ONCE DAILY. What changed:  how much to take how to take this when to take this additional instructions   naloxone 4 MG/0.1ML Liqd nasal spray kit Commonly known as: NARCAN Use as directed What changed:  how much to take how to take this when to take this   oxyCODONE-acetaminophen 5-325 MG tablet Commonly known as: PERCOCET/ROXICET Take 1 tablet po every 4 hours as needed pain maximum 6/day   pantoprazole 40 MG tablet Commonly known as: PROTONIX TAKE 1 TABLET BY MOUTH ONCE DAILY.   potassium chloride 10 MEQ tablet Commonly known as: KLOR-CON Take 1 tablet (10 mEq total) by mouth 2 (two) times daily.   thiamine 100 MG tablet Commonly known as: Vitamin B-1 Take 1 tablet (100 mg total) by mouth daily.   traZODone 100 MG tablet Commonly known as: DESYREL TAKE 1 AND A HALF TABLET BY MOUTH AT BEDTIME. What changed: See the new instructions.   vitamin B-12 1000 MCG tablet Commonly known as: CYANOCOBALAMIN Take 1,000 mcg by mouth daily.        Procedures/Studies: CT Head Wo Contrast  Result Date: 10/25/2021 CLINICAL DATA:  Mental status change.  Fever.  Lethargy. EXAM: CT HEAD WITHOUT CONTRAST TECHNIQUE: Contiguous axial images were obtained from the base of the skull through the vertex without intravenous contrast. RADIATION DOSE REDUCTION: This exam was performed according to the departmental dose-optimization program which includes automated exposure control, adjustment of the mA and/or kV according to patient size and/or use of iterative reconstruction technique. COMPARISON:  CT brain 10/12/2016 FINDINGS: Brain: There is mild cortical atrophy, unchanged from prior within normal  limits for patient age. The ventricles are normal in configuration. The basilar cisterns are patent. No mass, mass effect, or midline shift. Symmetric globus pallidus focal hypodensities are unchanged, possible remote lacunar infarcts. No acute intracranial hemorrhage is seen. No abnormal extra-axial fluid collection. Mild periventricular white matter hypodensities, similar to prior. This is nonspecific but most likely secondary to chronic ischemic white matter change. Preservation of the normal cortical gray-white interface without CT evidence of an acute major vascular territorial cortical based infarction. Vascular: No hyperdense vessel or unexpected calcification. Skull: Normal. Negative for fracture  or focal lesion. Sinuses/Orbits: Bilateral intra-ocular lens replacements are noted. Minimal mucosal opacification of the posterior aspect of the bilateral partially visualized maxillary sinuses. The visualized mastoid air cells are clear. Other: None. IMPRESSION: 1. Mild cortical atrophy and mild chronic ischemic white matter changes, unchanged from prior. 2. No acute intracranial process. Electronically Signed   By: Yvonne Kendall M.D.   On: 10/25/2021 15:50   CT Angio Chest PE W and/or Wo Contrast  Result Date: 10/25/2021 CLINICAL DATA:  Sepsis, fever, lethargy. EXAM: CT ANGIOGRAPHY CHEST CT ABDOMEN AND PELVIS WITH CONTRAST TECHNIQUE: Multidetector CT imaging of the chest was performed using the standard protocol during bolus administration of intravenous contrast. Multiplanar CT image reconstructions and MIPs were obtained to evaluate the vascular anatomy. Multidetector CT imaging of the abdomen and pelvis was performed using the standard protocol during bolus administration of intravenous contrast. RADIATION DOSE REDUCTION: This exam was performed according to the departmental dose-optimization program which includes automated exposure control, adjustment of the mA and/or kV according to patient size and/or  use of iterative reconstruction technique. CONTRAST:  130m OMNIPAQUE IOHEXOL 350 MG/ML SOLN COMPARISON:  CT chest 08/27/2020.  CT abdomen and pelvis 10/09/2014. FINDINGS: CTA CHEST FINDINGS Cardiovascular: There is suboptimal opacification of the pulmonary artery secondary to timing of the contrast bolus. There is no evidence for pulmonary embolism to the lobar level. The heart is mildly enlarged, unchanged. There is no pericardial effusion. Aorta is normal in size. Mediastinum/Nodes: No enlarged mediastinal, hilar, or axillary lymph nodes. Thyroid gland, trachea, and esophagus demonstrate no significant findings. Lungs/Pleura: There is atelectasis in the bilateral lower lobes. There are trace bilateral pleural effusions. There is no pneumothorax. Trachea and central airways are patent. Musculoskeletal: Cervical spinal fusion plate is partially visualized. No acute fractures are seen. Review of the MIP images confirms the above findings. CT ABDOMEN and PELVIS FINDINGS Hepatobiliary: There is diffuse fatty infiltration of the liver. No focal liver lesions are identified. Gallbladder appears within normal limits. No definite biliary ductal dilatation. Pancreas: Unremarkable. No pancreatic ductal dilatation or surrounding inflammatory changes. Spleen: Normal in size without focal abnormality. Adrenals/Urinary Tract: Adrenal glands are unremarkable. Kidneys are normal, without renal calculi, focal lesion, or hydronephrosis. Bladder is unremarkable. Stomach/Bowel: Stomach is within normal limits. No evidence of bowel wall thickening, distention, or inflammatory changes. The appendix is not seen. Vascular/Lymphatic: Aortic atherosclerosis. No enlarged abdominal or pelvic lymph nodes. There are some prominent inguinal lymph nodes bilaterally. Reproductive: Status post hysterectomy. No adnexal masses. Other: No abdominal wall hernia or abnormality. No abdominopelvic ascites. 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 No acute fractures. Review of the MIP images confirms the above findings. IMPRESSION: 1. Limited evaluation for pulmonary embolism secondary to timing of the contrast bolus. No evidence for pulmonary embolism to the lobar level. 2. Trace bilateral pleural effusions with bilateral lower lung atelectasis. 3. Cardiomegaly. 4. No acute localizing process in the abdomen or pelvis. 5. Fatty infiltration of the liver. 6.  Aortic Atherosclerosis (ICD10-I70.0). Electronically Signed   By: ARonney AstersM.D.   On: 10/25/2021 20:34   CT ABDOMEN PELVIS W CONTRAST  Result Date: 10/25/2021 CLINICAL DATA:  Sepsis, fever, lethargy. EXAM: CT ANGIOGRAPHY CHEST CT ABDOMEN AND PELVIS WITH CONTRAST TECHNIQUE: Multidetector CT imaging of the chest was performed using the standard protocol during bolus administration of intravenous contrast. Multiplanar CT image reconstructions and MIPs were obtained to evaluate the vascular anatomy. Multidetector CT imaging of the abdomen and pelvis was performed using the standard  protocol during bolus administration of intravenous contrast. RADIATION DOSE REDUCTION: This exam was performed according to the departmental dose-optimization program which includes automated exposure control, adjustment of the mA and/or kV according to patient size and/or use of iterative reconstruction technique. CONTRAST:  167m OMNIPAQUE IOHEXOL 350 MG/ML SOLN COMPARISON:  CT chest 08/27/2020.  CT abdomen and pelvis 10/09/2014. FINDINGS: CTA CHEST FINDINGS Cardiovascular: There is suboptimal opacification of the pulmonary artery secondary to timing of the contrast bolus. There is no evidence for pulmonary embolism to the lobar level. The heart is mildly enlarged, unchanged. There is no pericardial effusion. Aorta is normal in size. Mediastinum/Nodes: No enlarged mediastinal, hilar, or axillary lymph nodes. Thyroid gland, trachea, and esophagus demonstrate no significant findings. Lungs/Pleura:  There is atelectasis in the bilateral lower lobes. There are trace bilateral pleural effusions. There is no pneumothorax. Trachea and central airways are patent. Musculoskeletal: Cervical spinal fusion plate is partially visualized. No acute fractures are seen. Review of the MIP images confirms the above findings. CT ABDOMEN and PELVIS FINDINGS Hepatobiliary: There is diffuse fatty infiltration of the liver. No focal liver lesions are identified. Gallbladder appears within normal limits. No definite biliary ductal dilatation. Pancreas: Unremarkable. No pancreatic ductal dilatation or surrounding inflammatory changes. Spleen: Normal in size without focal abnormality. Adrenals/Urinary Tract: Adrenal glands are unremarkable. Kidneys are normal, without renal calculi, focal lesion, or hydronephrosis. Bladder is unremarkable. Stomach/Bowel: Stomach is within normal limits. No evidence of bowel wall thickening, distention, or inflammatory changes. The appendix is not seen. Vascular/Lymphatic: Aortic atherosclerosis. No enlarged abdominal or pelvic lymph nodes. There are some prominent inguinal lymph nodes bilaterally. Reproductive: Status post hysterectomy. No adnexal masses. Other: No abdominal wall hernia or abnormality. No abdominopelvic ascites. 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 No acute fractures. Review of the MIP images confirms the above findings. IMPRESSION: 1. Limited evaluation for pulmonary embolism secondary to timing of the contrast bolus. No evidence for pulmonary embolism to the lobar level. 2. Trace bilateral pleural effusions with bilateral lower lung atelectasis. 3. Cardiomegaly. 4. No acute localizing process in the abdomen or pelvis. 5. Fatty infiltration of the liver. 6.  Aortic Atherosclerosis (ICD10-I70.0). Electronically Signed   By: ARonney AstersM.D.   On: 10/25/2021 20:34   DG Chest Port 1 View  Result Date: 10/25/2021 CLINICAL DATA:  Sepsis EXAM:  PORTABLE CHEST 1 VIEW COMPARISON:  Chest x-ray 08/26/2020 FINDINGS: Heart size and mediastinal contours are within normal limits. Faint residual opacity in the left lower lung zone since previous study may represent scarring. No suspicious consolidation identified. No pleural effusion or pneumothorax visualized. No acute osseous abnormality appreciated. IMPRESSION: No acute intrathoracic process identified. Electronically Signed   By: DOfilia NeasM.D.   On: 10/25/2021 15:25     Subjective: Pt reports that she is feeling much better and wants to go home.  She has no complaints today.     Discharge Exam: Vitals:   10/27/21 0000 10/27/21 0422  BP: 100/65 109/79  Pulse: 64 60  Resp: 18 19  Temp: 98.2 F (36.8 C) 98.2 F (36.8 C)  SpO2: 100% 99%   Vitals:   10/26/21 1600 10/26/21 2000 10/27/21 0000 10/27/21 0422  BP: 98/73 91/63 100/65 109/79  Pulse: 63 60 64 60  Resp: 20 20 18 19   Temp: 98.3 F (36.8 C) 98.3 F (36.8 C) 98.2 F (36.8 C) 98.2 F (36.8 C)  TempSrc: Oral Oral Oral   SpO2: 96% 99% 100% 99%  Weight:  Height:       General: Pt is alert, awake, not in acute distress Cardiovascular: normal S1/S2 +, no rubs, no gallops Respiratory: CTA bilaterally, no wheezing, no rhonchi Abdominal: Soft, NT, ND, bowel sounds + Extremities:  no cyanosis   The results of significant diagnostics from this hospitalization (including imaging, microbiology, ancillary and laboratory) are listed below for reference.     Microbiology: Recent Results (from the past 240 hour(s))  Blood Culture (routine x 2)     Status: Abnormal   Collection Time: 10/25/21  3:02 PM   Specimen: BLOOD RIGHT FOREARM  Result Value Ref Range Status   Specimen Description   Final    BLOOD RIGHT FOREARM Performed at [facility], [address], [place], [place] [address]    Special Requests   Final    BOTTLES DRAWN AEROBIC AND ANAEROBIC Blood Culture adequate volume Performed at [facility],  [address] [place], [place] [address]    Culture  Setup Time   Final    AEROBIC BOTTLE ONLY GRAM POSITIVE COCCI IN CLUSTERS RESULTS CALLED TO [place], A. AT 0929 ON 10/25/21 BY BROWNING, D. CRITICAL RESULT CALLED TO, READ BACK BY AND VERIFIED WITH: PHARMD L.POOLE AT 1442 ON 10/26/2021 BY T.SAAD.    Culture (A)  Final    STAPHYLOCOCCUS HOMINIS THE SIGNIFICANCE OF ISOLATING THIS ORGANISM FROM A SINGLE SET OF BLOOD CULTURES WHEN MULTIPLE SETS ARE DRAWN IS UNCERTAIN. PLEASE NOTIFY THE MICROBIOLOGY DEPARTMENT WITHIN ONE WEEK IF SPECIATION AND SENSITIVITIES ARE REQUIRED. Performed at [facility], [place] [address], [place], [place] [address]    Report Status 10/27/2021 FINAL  Final  Blood Culture ID Panel (Reflexed)     Status: Abnormal   Collection Time: 10/25/21  3:02 PM  Result Value Ref Range Status   Enterococcus faecalis NOT DETECTED NOT DETECTED Final   Enterococcus Faecium NOT DETECTED NOT DETECTED Final   Listeria monocytogenes NOT DETECTED NOT DETECTED Final   Staphylococcus species DETECTED (A) NOT DETECTED Final    Comment: CRITICAL RESULT CALLED TO, READ BACK BY AND VERIFIED WITH: PHARMD L.POOLE AT 1442 ON 10/26/2021 BY T.SAAD.    Staphylococcus aureus (BCID) NOT DETECTED NOT DETECTED Final   Staphylococcus epidermidis NOT DETECTED NOT DETECTED Final   Staphylococcus lugdunensis NOT DETECTED NOT DETECTED Final   Streptococcus species NOT DETECTED NOT DETECTED Final   Streptococcus agalactiae NOT DETECTED NOT DETECTED Final   Streptococcus pneumoniae NOT DETECTED NOT DETECTED Final   Streptococcus pyogenes NOT DETECTED NOT DETECTED Final   A.calcoaceticus-baumannii NOT DETECTED NOT DETECTED Final   Bacteroides fragilis NOT DETECTED NOT DETECTED Final   Enterobacterales NOT DETECTED NOT DETECTED Final   Enterobacter cloacae complex NOT DETECTED NOT DETECTED Final   Escherichia coli NOT DETECTED NOT DETECTED Final   Klebsiella aerogenes NOT DETECTED NOT DETECTED Final    Klebsiella oxytoca NOT DETECTED NOT DETECTED Final   Klebsiella pneumoniae NOT DETECTED NOT DETECTED Final   Proteus species NOT DETECTED NOT DETECTED Final   Salmonella species NOT DETECTED NOT DETECTED Final   Serratia marcescens NOT DETECTED NOT DETECTED Final   Haemophilus influenzae NOT DETECTED NOT DETECTED Final   Neisseria meningitidis NOT DETECTED NOT DETECTED Final   Pseudomonas aeruginosa NOT DETECTED NOT DETECTED Final   Stenotrophomonas maltophilia NOT DETECTED NOT DETECTED Final   Candida albicans NOT DETECTED NOT DETECTED Final   Candida auris NOT DETECTED NOT DETECTED Final   Candida glabrata NOT DETECTED NOT DETECTED Final   Candida krusei NOT DETECTED NOT  DETECTED Final   Candida parapsilosis NOT DETECTED NOT DETECTED Final   Candida tropicalis NOT DETECTED NOT DETECTED Final   Cryptococcus neoformans/gattii NOT DETECTED NOT DETECTED Final    Comment: Performed at [facility], [place] [address] [place] [address], [place], [place] [address]  Blood Culture (routine x 2)     Status: None (Preliminary result)   Collection Time: 10/25/21  3:07 PM   Specimen: BLOOD RIGHT ARM  Result Value Ref Range Status   Specimen Description BLOOD RIGHT ARM  Final   Special Requests   Final    BOTTLES DRAWN AEROBIC AND ANAEROBIC Blood Culture results may not be optimal due to an excessive volume of blood received in culture bottles   Culture   Final    NO GROWTH 2 DAYS Performed at [facility], [address], [place], [place] [address]    Report Status PENDING  Incomplete  Resp Panel by RT-PCR (Flu A&B, Covid) Nasopharyngeal Swab     Status: None   Collection Time: 10/25/21  5:11 PM   Specimen: Nasopharyngeal Swab; Nasopharyngeal(NP) swabs in vial transport medium  Result Value Ref Range Status   SARS Coronavirus 2 by RT PCR NEGATIVE NEGATIVE Final    Comment: (NOTE) SARS-CoV-2 target nucleic acids are NOT DETECTED.  The SARS-CoV-2 RNA is generally detectable in upper respiratory specimens  during the acute phase of infection. The lowest concentration of SARS-CoV-2 viral copies this assay can detect is 138 copies/mL. A negative result does not preclude SARS-Cov-2 infection and should not be used as the sole basis for treatment or other patient management decisions. A negative result may occur with  improper specimen collection/handling, submission of specimen other than nasopharyngeal swab, presence of viral mutation(s) within the areas targeted by this assay, and inadequate number of viral copies(<138 copies/mL). A negative result must be combined with clinical observations, patient history, and epidemiological information. The expected result is Negative.  Fact Sheet for Patients:  EntrepreneurPulse.com.au  Fact Sheet for Healthcare Providers:  IncredibleEmployment.be  This test is no t yet approved or cleared by the [place] FDA and  has been authorized for detection and/or diagnosis of SARS-CoV-2 by FDA under an Emergency Use Authorization (EUA). This EUA will remain  in effect (meaning this test can be used) for the duration of the COVID-19 declaration under Section 564(b)(1) of the Act, 21 U.S.C.section 360bbb-3(b)(1), unless the authorization is terminated  or revoked sooner.       Influenza A by PCR NEGATIVE NEGATIVE Final   Influenza B by PCR NEGATIVE NEGATIVE Final    Comment: (NOTE) The Xpert Xpress SARS-CoV-2/FLU/RSV plus assay is intended as an aid in the diagnosis of influenza from Nasopharyngeal swab specimens and should not be used as a sole basis for treatment. Nasal washings and aspirates are unacceptable for Xpert Xpress SARS-CoV-2/FLU/RSV testing.  Fact Sheet for Patients: EntrepreneurPulse.com.au  Fact Sheet for Healthcare Providers: IncredibleEmployment.be  This test is not yet approved or cleared by the [place] FDA and has been authorized for detection  and/or diagnosis of SARS-CoV-2 by FDA under an Emergency Use Authorization (EUA). This EUA will remain in effect (meaning this test can be used) for the duration of the COVID-19 declaration under Section 564(b)(1) of the Act, 21 U.S.C. section 360bbb-3(b)(1), unless the authorization is terminated or revoked.  Performed at [facility], [address] [place], [place] [address]   Urine Culture     Status: None   Collection Time: 10/25/21  5:25 PM   Specimen: In/Out  Cath Urine  Result Value Ref Range Status   Specimen Description   Final    IN/OUT CATH URINE Performed at [facility], [address] [place], [place] [address]    Special Requests   Final    NONE Performed at [facility], [address], [place], [place] [address]    Culture   Final    NO GROWTH Performed at [facility], [place] [address] [place], [place] [address]    Report Status 10/27/2021 FINAL  Final  MRSA Next Gen by PCR, Nasal     Status: None   Collection Time: 10/26/21  1:58 AM   Specimen: Nasal Mucosa; Nasal Swab  Result Value Ref Range Status   MRSA by PCR Next Gen NOT DETECTED NOT DETECTED Final    Comment: (NOTE) The GeneXpert MRSA Assay (FDA approved for NASAL specimens only), is one component of a comprehensive MRSA colonization surveillance program. It is not intended to diagnose MRSA infection nor to guide or monitor treatment for MRSA infections. Test performance is not FDA approved in patients less than 39 years old. Performed at [facility], [address] [place], [place] [address]      Labs: BNP (last 3 results) Recent Labs    10/25/21 1502  BNP 56.4   Basic Metabolic Panel: Recent Labs  Lab 10/25/21 1515 10/26/21 0050 10/27/21 0658  NA 138 138 141  K 4.3 4.1 3.9  CL 99 101 106  CO2 32 29 29  GLUCOSE 164* 116* 140*  BUN 11 10 9   CREATININE 0.88 0.55 0.65  CALCIUM 8.3* 8.0* 7.5*  MG 1.8 1.6* 2.3  PHOS  --  2.7  --    Liver Function Tests: Recent Labs  Lab  10/25/21 1515 10/26/21 0050  AST 23 22  ALT 13 13  ALKPHOS 66 59  BILITOT 0.9 0.9  PROT 5.9* 5.8*  ALBUMIN 2.7* 2.6*   Recent Labs  Lab 10/25/21 1515  LIPASE 34   No results for input(s): AMMONIA in the last 168 hours. CBC: Recent Labs  Lab 10/25/21 1515 10/26/21 0050  WBC 5.6 5.4  NEUTROABS 4.3  --   HGB 15.3* 14.6  HCT 47.9* 45.6  MCV 116.5* 114.0*  PLT 159 144*   Cardiac Enzymes: No results for input(s): CKTOTAL, CKMB, CKMBINDEX, TROPONINI in the last 168 hours. BNP: Invalid input(s): POCBNP CBG: No results for input(s): GLUCAP in the last 168 hours. D-Dimer No results for input(s): DDIMER in the last 72 hours. Hgb A1c No results for input(s): HGBA1C in the last 72 hours. Lipid Profile No results for input(s): CHOL, HDL, LDLCALC, TRIG, CHOLHDL, LDLDIRECT in the last 72 hours. Thyroid function studies Recent Labs    10/25/21 1514  TSH 0.764   Anemia work up No results for input(s): VITAMINB12, FOLATE, FERRITIN, TIBC, IRON, RETICCTPCT in the last 72 hours. Urinalysis    Component Value Date/Time   COLORURINE AMBER (A) 10/25/2021 1752   APPEARANCEUR CLEAR 10/25/2021 1752   LABSPEC >1.030 (H) 10/25/2021 1752   PHURINE 5.0 10/25/2021 1752   GLUCOSEU NEGATIVE 10/25/2021 1752   HGBUR MODERATE (A) 10/25/2021 1752   BILIRUBINUR NEGATIVE 10/25/2021 1752   KETONESUR NEGATIVE 10/25/2021 1752   PROTEINUR TRACE (A) 10/25/2021 1752   UROBILINOGEN 1.0 10/06/2014 1202   NITRITE NEGATIVE 10/25/2021 1752   LEUKOCYTESUR NEGATIVE 10/25/2021 1752   Sepsis Labs Invalid input(s): PROCALCITONIN,  WBC,  LACTICIDVEN Microbiology Recent Results (from the past 240 hour(s))  Blood Culture (routine x 2)  Status: Abnormal   Collection Time: 10/25/21  3:02 PM   Specimen: BLOOD RIGHT FOREARM  Result Value Ref Range Status   Specimen Description   Final    BLOOD RIGHT FOREARM Performed at [facility], [address] [place], [place] [address]    Special Requests    Final    BOTTLES DRAWN AEROBIC AND ANAEROBIC Blood Culture adequate volume Performed at [facility], [address], [place], [place] [address]    Culture  Setup Time   Final    AEROBIC BOTTLE ONLY GRAM POSITIVE COCCI IN CLUSTERS RESULTS CALLED TO [place], A. AT 0929 ON 10/25/21 BY [place], D. CRITICAL RESULT CALLED TO, READ BACK BY AND VERIFIED WITH: PHARMD L.POOLE AT 1442 ON 10/26/2021 BY T.SAAD.    Culture (A)  Final    STAPHYLOCOCCUS HOMINIS THE SIGNIFICANCE OF ISOLATING THIS ORGANISM FROM A SINGLE SET OF BLOOD CULTURES WHEN MULTIPLE SETS ARE DRAWN IS UNCERTAIN. PLEASE NOTIFY THE MICROBIOLOGY DEPARTMENT WITHIN ONE WEEK IF SPECIATION AND SENSITIVITIES ARE REQUIRED. Performed at [facility], [place] [address] [place], [place] [address]    Report Status 10/27/2021 FINAL  Final  Blood Culture ID Panel (Reflexed)     Status: Abnormal   Collection Time: 10/25/21  3:02 PM  Result Value Ref Range Status   Enterococcus faecalis NOT DETECTED NOT DETECTED Final   Enterococcus Faecium NOT DETECTED NOT DETECTED Final   Listeria monocytogenes NOT DETECTED NOT DETECTED Final   Staphylococcus species DETECTED (A) NOT DETECTED Final    Comment: CRITICAL RESULT CALLED TO, READ BACK BY AND VERIFIED WITH: PHARMD L.POOLE AT 1442 ON 10/26/2021 BY T.SAAD.    Staphylococcus aureus (BCID) NOT DETECTED NOT DETECTED Final   Staphylococcus epidermidis NOT DETECTED NOT DETECTED Final   Staphylococcus lugdunensis NOT DETECTED NOT DETECTED Final   Streptococcus species NOT DETECTED NOT DETECTED Final   Streptococcus agalactiae NOT DETECTED NOT DETECTED Final   Streptococcus pneumoniae NOT DETECTED NOT DETECTED Final   Streptococcus pyogenes NOT DETECTED NOT DETECTED Final   A.calcoaceticus-baumannii NOT DETECTED NOT DETECTED Final   Bacteroides fragilis NOT DETECTED NOT DETECTED Final   Enterobacterales NOT DETECTED NOT DETECTED Final   Enterobacter cloacae complex NOT DETECTED NOT DETECTED Final    Escherichia coli NOT DETECTED NOT DETECTED Final   Klebsiella aerogenes NOT DETECTED NOT DETECTED Final   Klebsiella oxytoca NOT DETECTED NOT DETECTED Final   Klebsiella pneumoniae NOT DETECTED NOT DETECTED Final   Proteus species NOT DETECTED NOT DETECTED Final   Salmonella species NOT DETECTED NOT DETECTED Final   Serratia marcescens NOT DETECTED NOT DETECTED Final   Haemophilus influenzae NOT DETECTED NOT DETECTED Final   Neisseria meningitidis NOT DETECTED NOT DETECTED Final   Pseudomonas aeruginosa NOT DETECTED NOT DETECTED Final   Stenotrophomonas maltophilia NOT DETECTED NOT DETECTED Final   Candida albicans NOT DETECTED NOT DETECTED Final   Candida auris NOT DETECTED NOT DETECTED Final   Candida glabrata NOT DETECTED NOT DETECTED Final   Candida krusei NOT DETECTED NOT DETECTED Final   Candida parapsilosis NOT DETECTED NOT DETECTED Final   Candida tropicalis NOT DETECTED NOT DETECTED Final   Cryptococcus neoformans/gattii NOT DETECTED NOT DETECTED Final    Comment: Performed at [facility], [address], [place], [place] [address]  Blood Culture (routine x 2)     Status: None (Preliminary result)   Collection Time: 10/25/21  3:07 PM   Specimen: BLOOD RIGHT ARM  Result Value Ref Range Status   Specimen Description BLOOD  RIGHT ARM  Final   Special Requests   Final    BOTTLES DRAWN AEROBIC AND ANAEROBIC Blood Culture results may not be optimal due to an excessive volume of blood received in culture bottles   Culture   Final    NO GROWTH 2 DAYS Performed at [facility], [address], [place], [place] [address]    Report Status PENDING  Incomplete  Resp Panel by RT-PCR (Flu A&B, Covid) Nasopharyngeal Swab     Status: None   Collection Time: 10/25/21  5:11 PM   Specimen: Nasopharyngeal Swab; Nasopharyngeal(NP) swabs in vial transport medium  Result Value Ref Range Status   SARS Coronavirus 2 by RT PCR NEGATIVE NEGATIVE Final    Comment: (NOTE) SARS-CoV-2 target  nucleic acids are NOT DETECTED.  The SARS-CoV-2 RNA is generally detectable in upper respiratory specimens during the acute phase of infection. The lowest concentration of SARS-CoV-2 viral copies this assay can detect is 138 copies/mL. A negative result does not preclude SARS-Cov-2 infection and should not be used as the sole basis for treatment or other patient management decisions. A negative result may occur with  improper specimen collection/handling, submission of specimen other than nasopharyngeal swab, presence of viral mutation(s) within the areas targeted by this assay, and inadequate number of viral copies(<138 copies/mL). A negative result must be combined with clinical observations, patient history, and epidemiological information. The expected result is Negative.  Fact Sheet for Patients:  EntrepreneurPulse.com.au  Fact Sheet for Healthcare Providers:  IncredibleEmployment.be  This test is no t yet approved or cleared by the [place] FDA and  has been authorized for detection and/or diagnosis of SARS-CoV-2 by FDA under an Emergency Use Authorization (EUA). This EUA will remain  in effect (meaning this test can be used) for the duration of the COVID-19 declaration under Section 564(b)(1) of the Act, 21 U.S.C.section 360bbb-3(b)(1), unless the authorization is terminated  or revoked sooner.       Influenza A by PCR NEGATIVE NEGATIVE Final   Influenza B by PCR NEGATIVE NEGATIVE Final    Comment: (NOTE) The Xpert Xpress SARS-CoV-2/FLU/RSV plus assay is intended as an aid in the diagnosis of influenza from Nasopharyngeal swab specimens and should not be used as a sole basis for treatment. Nasal washings and aspirates are unacceptable for Xpert Xpress SARS-CoV-2/FLU/RSV testing.  Fact Sheet for Patients: EntrepreneurPulse.com.au  Fact Sheet for Healthcare  Providers: IncredibleEmployment.be  This test is not yet approved or cleared by the [place] FDA and has been authorized for detection and/or diagnosis of SARS-CoV-2 by FDA under an Emergency Use Authorization (EUA). This EUA will remain in effect (meaning this test can be used) for the duration of the COVID-19 declaration under Section 564(b)(1) of the Act, 21 U.S.C. section 360bbb-3(b)(1), unless the authorization is terminated or revoked.  Performed at [facility], [address] [place], [place] [address]   Urine Culture     Status: None   Collection Time: 10/25/21  5:25 PM   Specimen: In/Out Cath Urine  Result Value Ref Range Status   Specimen Description   Final    IN/OUT CATH URINE Performed at [facility], [address], [place], [place] [address]    Special Requests   Final    NONE Performed at [facility], [address], [place], [place] [address]    Culture   Final    NO GROWTH Performed at [facility], [place] [address], [place], [place] [address]    Report Status  10/27/2021 FINAL  Final  MRSA Next Gen by PCR, Nasal     Status: None   Collection Time: 10/26/21  1:58 AM   Specimen: Nasal Mucosa; Nasal Swab  Result Value Ref Range Status   MRSA by PCR Next Gen NOT DETECTED NOT DETECTED Final    Comment: (NOTE) The GeneXpert MRSA Assay (FDA approved for NASAL specimens only), is one component of a comprehensive MRSA colonization surveillance program. It is not intended to diagnose MRSA infection nor to guide or monitor treatment for MRSA infections. Test performance is not FDA approved in patients less than 56 years old. Performed at [facility], [address], [place], [place] [address]    Time coordinating discharge: 35 mins   SIGNED:  Irwin Brakeman, MD  [facility] 10/27/2021, 11:42 AM How to contact the [facility] Attending or Consulting provider [place] or covering provider during after hours [place], for this patient?   Check the care team in [facility] and look for a) attending/consulting [facility] provider listed and b) the [facility] team listed Log into [email or web address] and use [facility]'s universal password to access. If you do not have the password, please contact the hospital operator. Locate the [facility] provider you are looking for under [facility] and page to a number that you can be directly reached. If you still have difficulty reaching the provider, please page the [facility] (Director on Call) for the Hospitalists listed on amion for assistance.

## 2021-10-27 NOTE — Telephone Encounter (Signed)
Pt husband called and stated he needed an rx for the patient for new tubing and nose pieces for the oxygen. [employer].  [phone]

## 2021-10-27 NOTE — Progress Notes (Signed)
PT Cancellation Note  Patient Details Name: [name] MRN: [id number] DOB: Jan 15, 1966   Cancelled Treatment:    Reason Eval/Treat Not Completed: PT screened, no needs identified, will sign off.  Patient at baseline, non-ambulatory, bed-bound, mechanical lift for transfers  11:45 AM, 10/27/21 [name], MPT Physical Therapist with [facility] [phone] office [phone] mobile phone

## 2021-10-27 NOTE — Progress Notes (Signed)
°  Transition of Care [facility]) Screening Note   Patient Details  Name: [name] Date of Birth: [birth year]   Transition of Care [facility] CM/SW Contact:    Ihor Gully, LCSW Phone Number: 10/27/2021, 10:08 AM    Transition of Care Department [facility]) has reviewed patient and no TOC needs have been identified at this time. We will continue to monitor patient advancement through interdisciplinary progression rounds. If new patient transition needs arise, please place a TOC consult.   Keaton Beichner, Clydene Pugh, LCSW

## 2021-10-28 ENCOUNTER — Telehealth: Payer: Self-pay

## 2021-10-28 NOTE — Telephone Encounter (Signed)
This was signed and completed thank you

## 2021-10-28 NOTE — Telephone Encounter (Signed)
Prescription faxed and informed patient.

## 2021-10-28 NOTE — Telephone Encounter (Signed)
Transition Care Management Follow-up Telephone Call Date of discharge and from where: 10/27/21 [facility] Diagnosis: Pneumonia How have you been since you were released from the hospital? Pt states she is feeling much better. Any questions or concerns? No  Items Reviewed: Did the pt receive and understand the discharge instructions provided? Yes  Medications obtained and verified? Yes  Other? No  Any new allergies since your discharge? No  Dietary orders reviewed? Yes Do you have support at home? Yes   Home Care and Equipment/Supplies: Were home health services ordered? not applicable If so, what is the name of the agency? N/A  Has the agency set up a time to come to the patient's home? not applicable Were any new equipment or medical supplies ordered?  No What is the name of the medical supply agency? N/A Were you able to get the supplies/equipment? not applicable Do you have any questions related to the use of the equipment or supplies? No  Functional Questionnaire: (I = Independent and D = Dependent) ADLs: D  Bathing/Dressing- D  Meal Prep- D  Eating- D  Maintaining continence- D  Transferring/Ambulation- D  Managing Meds- D  Follow up appointments reviewed:  PCP Hospital f/u appt confirmed?  Pt is homebound.    [facility] f/u appt confirmed? N/A  Are transportation arrangements needed? No  If their condition worsens, is the pt aware to call PCP or go to the Emergency Dept.? Yes Was the patient provided with contact information for the PCP's office or ED? Yes Was to pt encouraged to call back with questions or concerns? Yes

## 2021-10-30 LAB — CULTURE, BLOOD (ROUTINE X 2): Culture: NO GROWTH

## 2021-10-30 NOTE — Telephone Encounter (Signed)
I would recommend a home visit at 4:10 PM to be done somewhere in the next 14 days.  Please coordinate with her husband [name]

## 2021-11-02 NOTE — Telephone Encounter (Signed)
11/02/21 @ 8:45 am: LM for pt's husband, [name] to call either myself or the office to schedule home visit for [facility]. Mjp,lpn

## 2021-11-10 ENCOUNTER — Ambulatory Visit: Payer: BC Managed Care – PPO | Admitting: Family Medicine

## 2021-11-10 ENCOUNTER — Other Ambulatory Visit: Payer: Self-pay

## 2021-11-10 DIAGNOSIS — K219 Gastro-esophageal reflux disease without esophagitis: Secondary | ICD-10-CM

## 2021-11-10 DIAGNOSIS — F3340 Major depressive disorder, recurrent, in remission, unspecified: Secondary | ICD-10-CM

## 2021-11-10 DIAGNOSIS — G709 Myoneural disorder, unspecified: Secondary | ICD-10-CM

## 2021-11-10 DIAGNOSIS — G894 Chronic pain syndrome: Secondary | ICD-10-CM | POA: Diagnosis not present

## 2021-11-10 DIAGNOSIS — D6859 Other primary thrombophilia: Secondary | ICD-10-CM | POA: Diagnosis not present

## 2021-11-10 NOTE — Progress Notes (Signed)
Subjective:    Patient ID: [name], female    DOB: 1966/09/11, 56 y.o.   MRN: [id number]  HPI Pt recently admitted to [facility] 10/25/21-10/27/21. Pt had high fevers and significant other called EMS. Pt was admitted with pneumonia. Significant other states that pt is doing well. No fevers, no trouble breathing.   This patient recently in the hospital Hospital records reviewed Tests were reviewed This was discussed with the husband and the wife She is bedbound and homebound.  They do not have any help that comes out to them The husband provides all the care and he also has a local helper that comes in for a few hours per day Is very stressful for the patient and her husband She does have chronic pain and discomfort and is on medication she also has hypercoagulability and is on anticoagulant She denies any rectal bleeding denies any fevers difficulty breathing  Review of Systems     Objective:   Physical Exam  She is bedbound She has weakness in both arms and legs Weakness most profound in the left arm and the lower legs She is moderately over overweight Lungs are clear hearts regular blood pressure on the lower end of normal but normal for her      Assessment & Plan:  Very complex patient Also very complex home situation Husband does a good job taking care of her Patient is homebound bedbound They cannot afford to hire additional help We will try to get home health to see if they can do 1 or 2 visits given that she was in the hospital for pneumonia and hypocalcemia Perhaps they will go see her and also do some lab work It is worth a try  1. Neuromuscular disorder [facility]) She has a neuromuscular degenerative disorder.  Over the past 10 years she has had progressive loss of strength in her legs as well as the left arm.  She is seen specialist in [place] as well as [place] and no one has been able to pinpoint exactly assess for neuromuscular disorder.  It is given her a  lot of neuropathy type pains in her legs and arms as well as aching all over.  Because of all this she has had to be on opioids.  We have little bit down to 5 mg no more than 4/day She does take gabapentin 300 mg 3 times daily and denies that it causes drowsiness She is on oxygen because of underlying lung issues  2. Recurrent major depression in remission ([place]) Depression in remission currently  3. Hypocalcemia We will need to do follow-up blood work continue calcium may need to increase, currently taking 2  4. Gastroesophageal reflux disease without esophagitis Taking PPI tolerating well no GI bleed  5. Primary hypercoagulable state ([place]) Taking Eliquis no bleeding issues  6. Chronic pain syndrome Chronic pain The patient was seen in followup for chronic pain. A review over at their current pain status was discussed. Drug registry was checked. Prescriptions were given.  Regular follow-up recommended. Discussion was held regarding the importance of compliance with medication as well as pain medication contract.  Patient was informed that medication may cause drowsiness and should not be combined  with other medications/alcohol or street drugs. If the patient feels medication is causing altered alertness then do not drive or operate dangerous equipment.  Should be noted that the patient appears to be meeting appropriate use of opioids and response.  Evidenced by improved function and decent pain control  without significant side effects and no evidence of overt aberrancy issues.  Upon discussion with the patient today they understand that opioid therapy is optional and they feel that the pain has been refractory to reasonable conservative measures and is significant and affecting quality of life enough to warrant ongoing therapy and wishes to continue opioids.  Refills were provided. Very difficult situation We are prescribing the smallest amount that we feel that she can get by with.  I  believe that she potentially could utilize more opioids but I am concerned regarding safety so therefore we will keep at the current dose.  This patient is homebound.  Down the road if we step away from pain management this could pose a very difficult situation for the patient  Follow-up again in 3 months  We are trying to arrange home health we are trying to arrange blood work

## 2021-11-15 NOTE — Progress Notes (Signed)
11/15/21-referral to home health placed

## 2021-11-15 NOTE — Addendum Note (Signed)
Addended by: Vicente Males on: 11/15/2021 08:37 AM   Modules accepted: Orders

## 2021-11-29 ENCOUNTER — Other Ambulatory Visit: Payer: Self-pay | Admitting: Family Medicine

## 2021-12-31 ENCOUNTER — Other Ambulatory Visit: Payer: Self-pay | Admitting: Family Medicine

## 2022-01-06 ENCOUNTER — Telehealth: Payer: Self-pay | Admitting: Family Medicine

## 2022-01-06 ENCOUNTER — Other Ambulatory Visit: Payer: Self-pay | Admitting: Family Medicine

## 2022-01-06 NOTE — Telephone Encounter (Signed)
Front-please work with her husband [name] in order to have [place] do home visit with her within the first 2 weeks of May.  Thanks-Dr. Nicki Reaper cancel the June home visit thank you ?

## 2022-01-12 NOTE — Telephone Encounter (Signed)
Patient wanted 5/4 at 4 pm ?

## 2022-01-12 NOTE — Telephone Encounter (Signed)
That we were please put it on my schedule thank you so much ?

## 2022-01-19 ENCOUNTER — Other Ambulatory Visit: Payer: Self-pay | Admitting: Family Medicine

## 2022-01-29 ENCOUNTER — Other Ambulatory Visit: Payer: Self-pay | Admitting: Family Medicine

## 2022-02-03 ENCOUNTER — Other Ambulatory Visit: Payer: Self-pay | Admitting: Family Medicine

## 2022-02-09 ENCOUNTER — Ambulatory Visit (INDEPENDENT_AMBULATORY_CARE_PROVIDER_SITE_OTHER): Payer: BC Managed Care – PPO | Admitting: Family Medicine

## 2022-02-09 VITALS — BP 110/70

## 2022-02-09 DIAGNOSIS — G8929 Other chronic pain: Secondary | ICD-10-CM | POA: Diagnosis not present

## 2022-02-09 DIAGNOSIS — F3340 Major depressive disorder, recurrent, in remission, unspecified: Secondary | ICD-10-CM | POA: Diagnosis not present

## 2022-02-09 DIAGNOSIS — G709 Myoneural disorder, unspecified: Secondary | ICD-10-CM

## 2022-02-09 DIAGNOSIS — D6851 Activated protein C resistance: Secondary | ICD-10-CM | POA: Diagnosis not present

## 2022-02-09 MED ORDER — GABAPENTIN 300 MG PO CAPS: ORAL_CAPSULE | ORAL | 5 refills | Status: AC

## 2022-02-09 MED ORDER — OXYCODONE-ACETAMINOPHEN 5-325 MG PO TABS: ORAL_TABLET | ORAL | 0 refills | Status: AC

## 2022-02-09 NOTE — Progress Notes (Signed)
? ?Subjective:  ? ? Patient ID: [name], female    DOB: Nov 16, 1965, 56 y.o.   MRN: [id number] ?Home visit ?HPI ?This patient was seen today for chronic pain ? ?The medication list was reviewed and updated. ? ?Location of Pain for which the patient has been treated with regarding narcotics: feet and hands ? ?Onset of this pain: chronic ? ? -Compliance with medication: daily ? ?- Number patient states they take daily: 6  ? ?-when was the last dose patient took? 3:45 pm today ? ?The patient was advised the importance of maintaining medication and not using illegal substances with these. ? ?Here for refills and follow up ? ?The patient was educated that we can provide 3 monthly scripts for their medication, it is their responsibility to follow the instructions. ? ?Side effects or complications from medications: no ? ?Patient is aware that pain medications are meant to minimize the severity of the pain to allow their pain levels to improve to allow for better function. They are aware of that pain medications cannot totally remove their pain. ? ?Due for UDT ( at least once per year) : None currently ? ?Scale of 1 to 10 ( 1 is least 10 is most) ?Your pain level without the medicine: 10 ?Your pain level with medication 7 ? ?Scale 1 to 10 ( 1-helps very little, 10 helps very well) ?How well does your pain medication reduce your pain so you can function better through out the day? 7 ? ?Quality of the pain: sharp, burning  ? ?Persistence of the pain: all the time ? ?Modifying factors: oil helps  ? ? ?   ? ? ?Review of Systems ? ?   ?Objective:  ? Physical Exam ?General-in no acute distress ?Eyes-no discharge ?Lungs-respiratory rate normal, CTA ?CV-no murmurs,RRR ?Extremities skin warm dry no edema ?Neuro grossly normal ?Behavior normal, alert ?No edema in the legs abdomen is soft skin warm dry ?Blood pressure 110/70 ? ? ? ?   ?Assessment & Plan:  ? ?1. Neuromuscular disorder ([place]) ?Has been under specialist care.  Patient  has been homebound and either wheelchair-bound or bedbound for multiple years gabapentin is necessary to help her with nerve related pain in the legs ? ?2. Recurrent major depression in remission [facility]) ?Currently doing well with this moods doing well takes trazodone at night to help her rest ? ?3. Factor V Leiden mutation, heterozygous ([place]) ?Does take her blood thinner has not had any bleeding issues patient at high risk for blood clots ? ?4. Encounter for chronic pain management ?The patient was seen in followup for chronic pain. ?A review over at their current pain status was discussed. Drug registry was checked. ?Prescriptions were given.  Regular follow-up recommended. ?Discussion was held regarding the importance of compliance with medication as well as pain medication contract. ? ?Patient was informed that medication may cause drowsiness and should not be combined  with other medications/alcohol or street drugs. If the patient feels medication is causing altered alertness then do not drive or operate dangerous equipment. ? ?Should be noted that the patient appears to be meeting appropriate use of opioids and response.  Evidenced by improved function and decent pain control without significant side effects and no evidence of overt aberrancy issues.  Upon discussion with the patient today they understand that opioid therapy is optional and they feel that the pain has been refractory to reasonable conservative measures and is significant and affecting quality of life enough to warrant ongoing therapy  and wishes to continue opioids.  Refills were provided. ? ?Patient is on pain medicine is necessary for her to stay on this ?It is impossible to get home health to come out and check patient ?We will do home visits every 3 months but family clearly understands that if she has any lethargy fevers cough signs of sickness she would need to go to emergency department for acute evaluation ? ?

## 2022-02-22 ENCOUNTER — Other Ambulatory Visit: Payer: Self-pay | Admitting: Family Medicine

## 2022-03-01 ENCOUNTER — Other Ambulatory Visit: Payer: Self-pay | Admitting: Family Medicine

## 2022-03-03 ENCOUNTER — Other Ambulatory Visit: Payer: Self-pay | Admitting: Family Medicine

## 2022-03-21 ENCOUNTER — Other Ambulatory Visit: Payer: Self-pay | Admitting: Family Medicine

## 2022-03-27 ENCOUNTER — Ambulatory Visit: Payer: BC Managed Care – PPO | Admitting: Family Medicine

## 2022-03-31 ENCOUNTER — Ambulatory Visit: Payer: BC Managed Care – PPO | Admitting: Family Medicine

## 2022-04-10 ENCOUNTER — Inpatient Hospital Stay (HOSPITAL_COMMUNITY): Payer: BC Managed Care – PPO

## 2022-04-10 ENCOUNTER — Emergency Department (HOSPITAL_COMMUNITY): Payer: BC Managed Care – PPO

## 2022-04-10 ENCOUNTER — Inpatient Hospital Stay (HOSPITAL_COMMUNITY)
Admission: EM | Admit: 2022-04-10 | Discharge: 2022-05-09 | DRG: 208 | Disposition: E | Payer: BC Managed Care – PPO | Attending: Critical Care Medicine | Admitting: Critical Care Medicine

## 2022-04-10 DIAGNOSIS — Z515 Encounter for palliative care: Secondary | ICD-10-CM | POA: Diagnosis not present

## 2022-04-10 DIAGNOSIS — D696 Thrombocytopenia, unspecified: Secondary | ICD-10-CM | POA: Diagnosis present

## 2022-04-10 DIAGNOSIS — J690 Pneumonitis due to inhalation of food and vomit: Secondary | ICD-10-CM | POA: Diagnosis not present

## 2022-04-10 DIAGNOSIS — G931 Anoxic brain damage, not elsewhere classified: Secondary | ICD-10-CM | POA: Diagnosis present

## 2022-04-10 DIAGNOSIS — G936 Cerebral edema: Secondary | ICD-10-CM | POA: Diagnosis not present

## 2022-04-10 DIAGNOSIS — J9811 Atelectasis: Secondary | ICD-10-CM | POA: Diagnosis not present

## 2022-04-10 DIAGNOSIS — Y92009 Unspecified place in unspecified non-institutional (private) residence as the place of occurrence of the external cause: Secondary | ICD-10-CM | POA: Diagnosis not present

## 2022-04-10 DIAGNOSIS — Z7901 Long term (current) use of anticoagulants: Secondary | ICD-10-CM

## 2022-04-10 DIAGNOSIS — R404 Transient alteration of awareness: Secondary | ICD-10-CM | POA: Diagnosis not present

## 2022-04-10 DIAGNOSIS — E872 Acidosis, unspecified: Secondary | ICD-10-CM | POA: Diagnosis present

## 2022-04-10 DIAGNOSIS — Z9842 Cataract extraction status, left eye: Secondary | ICD-10-CM

## 2022-04-10 DIAGNOSIS — R0902 Hypoxemia: Secondary | ICD-10-CM | POA: Diagnosis not present

## 2022-04-10 DIAGNOSIS — T17428A Food in trachea causing other injury, initial encounter: Secondary | ICD-10-CM | POA: Diagnosis present

## 2022-04-10 DIAGNOSIS — N179 Acute kidney failure, unspecified: Secondary | ICD-10-CM | POA: Diagnosis present

## 2022-04-10 DIAGNOSIS — I10 Essential (primary) hypertension: Secondary | ICD-10-CM | POA: Diagnosis present

## 2022-04-10 DIAGNOSIS — Z8659 Personal history of other mental and behavioral disorders: Secondary | ICD-10-CM

## 2022-04-10 DIAGNOSIS — R578 Other shock: Secondary | ICD-10-CM | POA: Diagnosis present

## 2022-04-10 DIAGNOSIS — I469 Cardiac arrest, cause unspecified: Principal | ICD-10-CM | POA: Diagnosis present

## 2022-04-10 DIAGNOSIS — Z7401 Bed confinement status: Secondary | ICD-10-CM | POA: Diagnosis not present

## 2022-04-10 DIAGNOSIS — G894 Chronic pain syndrome: Secondary | ICD-10-CM | POA: Diagnosis present

## 2022-04-10 DIAGNOSIS — Z90710 Acquired absence of both cervix and uterus: Secondary | ICD-10-CM

## 2022-04-10 DIAGNOSIS — Z6832 Body mass index (BMI) 32.0-32.9, adult: Secondary | ICD-10-CM

## 2022-04-10 DIAGNOSIS — G35 Multiple sclerosis: Secondary | ICD-10-CM | POA: Diagnosis present

## 2022-04-10 DIAGNOSIS — T17920A Food in respiratory tract, part unspecified causing asphyxiation, initial encounter: Principal | ICD-10-CM | POA: Diagnosis present

## 2022-04-10 DIAGNOSIS — Z961 Presence of intraocular lens: Secondary | ICD-10-CM | POA: Diagnosis present

## 2022-04-10 DIAGNOSIS — Z91040 Latex allergy status: Secondary | ICD-10-CM

## 2022-04-10 DIAGNOSIS — I4901 Ventricular fibrillation: Secondary | ICD-10-CM | POA: Diagnosis present

## 2022-04-10 DIAGNOSIS — Z9841 Cataract extraction status, right eye: Secondary | ICD-10-CM

## 2022-04-10 DIAGNOSIS — J449 Chronic obstructive pulmonary disease, unspecified: Secondary | ICD-10-CM | POA: Diagnosis not present

## 2022-04-10 DIAGNOSIS — G935 Compression of brain: Secondary | ICD-10-CM | POA: Diagnosis not present

## 2022-04-10 DIAGNOSIS — I468 Cardiac arrest due to other underlying condition: Secondary | ICD-10-CM | POA: Diagnosis not present

## 2022-04-10 DIAGNOSIS — J80 Acute respiratory distress syndrome: Secondary | ICD-10-CM | POA: Diagnosis not present

## 2022-04-10 DIAGNOSIS — D6851 Activated protein C resistance: Secondary | ICD-10-CM | POA: Diagnosis present

## 2022-04-10 DIAGNOSIS — Z8673 Personal history of transient ischemic attack (TIA), and cerebral infarction without residual deficits: Secondary | ICD-10-CM

## 2022-04-10 DIAGNOSIS — R778 Other specified abnormalities of plasma proteins: Secondary | ICD-10-CM | POA: Diagnosis present

## 2022-04-10 DIAGNOSIS — Z888 Allergy status to other drugs, medicaments and biological substances status: Secondary | ICD-10-CM

## 2022-04-10 DIAGNOSIS — R0989 Other specified symptoms and signs involving the circulatory and respiratory systems: Secondary | ICD-10-CM | POA: Diagnosis not present

## 2022-04-10 DIAGNOSIS — Z79899 Other long term (current) drug therapy: Secondary | ICD-10-CM

## 2022-04-10 DIAGNOSIS — F32A Depression, unspecified: Secondary | ICD-10-CM | POA: Diagnosis present

## 2022-04-10 DIAGNOSIS — K559 Vascular disorder of intestine, unspecified: Secondary | ICD-10-CM | POA: Diagnosis present

## 2022-04-10 DIAGNOSIS — Z66 Do not resuscitate: Secondary | ICD-10-CM | POA: Diagnosis present

## 2022-04-10 DIAGNOSIS — R748 Abnormal levels of other serum enzymes: Secondary | ICD-10-CM | POA: Diagnosis present

## 2022-04-10 DIAGNOSIS — Z4682 Encounter for fitting and adjustment of non-vascular catheter: Secondary | ICD-10-CM | POA: Diagnosis not present

## 2022-04-10 DIAGNOSIS — K219 Gastro-esophageal reflux disease without esophagitis: Secondary | ICD-10-CM | POA: Diagnosis present

## 2022-04-10 DIAGNOSIS — Z880 Allergy status to penicillin: Secondary | ICD-10-CM

## 2022-04-10 DIAGNOSIS — Z8249 Family history of ischemic heart disease and other diseases of the circulatory system: Secondary | ICD-10-CM

## 2022-04-10 DIAGNOSIS — E669 Obesity, unspecified: Secondary | ICD-10-CM | POA: Diagnosis present

## 2022-04-10 DIAGNOSIS — Z9911 Dependence on respirator [ventilator] status: Secondary | ICD-10-CM | POA: Diagnosis not present

## 2022-04-10 DIAGNOSIS — J9601 Acute respiratory failure with hypoxia: Secondary | ICD-10-CM | POA: Diagnosis not present

## 2022-04-10 DIAGNOSIS — D649 Anemia, unspecified: Secondary | ICD-10-CM | POA: Diagnosis present

## 2022-04-10 DIAGNOSIS — R1310 Dysphagia, unspecified: Secondary | ICD-10-CM | POA: Diagnosis present

## 2022-04-10 DIAGNOSIS — R680 Hypothermia, not associated with low environmental temperature: Secondary | ICD-10-CM | POA: Diagnosis present

## 2022-04-10 DIAGNOSIS — M797 Fibromyalgia: Secondary | ICD-10-CM | POA: Diagnosis present

## 2022-04-10 DIAGNOSIS — R7303 Prediabetes: Secondary | ICD-10-CM | POA: Diagnosis present

## 2022-04-10 DIAGNOSIS — Z833 Family history of diabetes mellitus: Secondary | ICD-10-CM

## 2022-04-10 DIAGNOSIS — I499 Cardiac arrhythmia, unspecified: Secondary | ICD-10-CM | POA: Diagnosis not present

## 2022-04-10 DIAGNOSIS — Z981 Arthrodesis status: Secondary | ICD-10-CM

## 2022-04-10 DIAGNOSIS — R739 Hyperglycemia, unspecified: Secondary | ICD-10-CM | POA: Diagnosis not present

## 2022-04-10 DIAGNOSIS — Z452 Encounter for adjustment and management of vascular access device: Secondary | ICD-10-CM | POA: Diagnosis not present

## 2022-04-10 DIAGNOSIS — Z87891 Personal history of nicotine dependence: Secondary | ICD-10-CM

## 2022-04-10 LAB — CBC
HCT: 35.7 % — ABNORMAL LOW (ref 36.0–46.0)
Hemoglobin: 11 g/dL — ABNORMAL LOW (ref 12.0–15.0)
MCH: 36.2 pg — ABNORMAL HIGH (ref 26.0–34.0)
MCHC: 30.8 g/dL (ref 30.0–36.0)
MCV: 117.4 fL — ABNORMAL HIGH (ref 80.0–100.0)
Platelets: 136 10*3/uL — ABNORMAL LOW (ref 150–400)
RBC: 3.04 MIL/uL — ABNORMAL LOW (ref 3.87–5.11)
RDW: 12.6 % (ref 11.5–15.5)
WBC: 9.1 10*3/uL (ref 4.0–10.5)
nRBC: 1.3 % — ABNORMAL HIGH (ref 0.0–0.2)

## 2022-04-10 LAB — POCT I-STAT 7, (LYTES, BLD GAS, ICA,H+H)
Acid-base deficit: 21 mmol/L — ABNORMAL HIGH (ref 0.0–2.0)
Bicarbonate: 9.7 mmol/L — ABNORMAL LOW (ref 20.0–28.0)
Calcium, Ion: 1.23 mmol/L (ref 1.15–1.40)
HCT: 41 % (ref 36.0–46.0)
Hemoglobin: 13.9 g/dL (ref 12.0–15.0)
O2 Saturation: 95 %
Patient temperature: 34.7
Potassium: 3.8 mmol/L (ref 3.5–5.1)
Sodium: 134 mmol/L — ABNORMAL LOW (ref 135–145)
TCO2: 11 mmol/L — ABNORMAL LOW (ref 22–32)
pCO2 arterial: 34.8 mmHg (ref 32–48)
pH, Arterial: 7.035 — CL (ref 7.35–7.45)
pO2, Arterial: 96 mmHg (ref 83–108)

## 2022-04-10 LAB — BLOOD GAS, ARTERIAL
Acid-base deficit: 18.5 mmol/L — ABNORMAL HIGH (ref 0.0–2.0)
Bicarbonate: 9.3 mmol/L — ABNORMAL LOW (ref 20.0–28.0)
Drawn by: 27733
FIO2: 80 %
O2 Saturation: 98.8 %
Patient temperature: 34.4
pCO2 arterial: 25 mmHg — ABNORMAL LOW (ref 32–48)
pH, Arterial: 7.16 — CL (ref 7.35–7.45)
pO2, Arterial: 178 mmHg — ABNORMAL HIGH (ref 83–108)

## 2022-04-10 LAB — COMPREHENSIVE METABOLIC PANEL
ALT: 77 U/L — ABNORMAL HIGH (ref 0–44)
AST: 146 U/L — ABNORMAL HIGH (ref 15–41)
Albumin: 2 g/dL — ABNORMAL LOW (ref 3.5–5.0)
Alkaline Phosphatase: 79 U/L (ref 38–126)
Anion gap: 16 — ABNORMAL HIGH (ref 5–15)
BUN: 10 mg/dL (ref 6–20)
CO2: 18 mmol/L — ABNORMAL LOW (ref 22–32)
Calcium: 9.8 mg/dL (ref 8.9–10.3)
Chloride: 106 mmol/L (ref 98–111)
Creatinine, Ser: 1.18 mg/dL — ABNORMAL HIGH (ref 0.44–1.00)
GFR, Estimated: 54 mL/min — ABNORMAL LOW (ref >60)
Glucose, Bld: 387 mg/dL — ABNORMAL HIGH (ref 70–99)
Potassium: 4 mmol/L (ref 3.5–5.1)
Sodium: 140 mmol/L (ref 135–145)
Total Bilirubin: 0.3 mg/dL (ref 0.3–1.2)
Total Protein: 4.4 g/dL — ABNORMAL LOW (ref 6.5–8.1)

## 2022-04-10 LAB — TYPE AND SCREEN
ABO/RH(D): O POS
Antibody Screen: NEGATIVE

## 2022-04-10 LAB — TRIGLYCERIDES: Triglycerides: 326 mg/dL — ABNORMAL HIGH (ref <150)

## 2022-04-10 LAB — PROCALCITONIN: Procalcitonin: <0.1 ng/mL

## 2022-04-10 LAB — MAGNESIUM: Magnesium: 3.6 mg/dL — ABNORMAL HIGH (ref 1.7–2.4)

## 2022-04-10 LAB — MRSA NEXT GEN BY PCR, NASAL: MRSA by PCR Next Gen: NOT DETECTED

## 2022-04-10 LAB — CREATININE, SERUM
Creatinine, Ser: 1.56 mg/dL — ABNORMAL HIGH (ref 0.44–1.00)
GFR, Estimated: 39 mL/min — ABNORMAL LOW (ref >60)

## 2022-04-10 LAB — LACTIC ACID, PLASMA
Lactic Acid, Venous: >9 mmol/L (ref 0.5–1.9)
Lactic Acid, Venous: >9 mmol/L (ref 0.5–1.9)

## 2022-04-10 LAB — PHOSPHORUS: Phosphorus: 9.3 mg/dL — ABNORMAL HIGH (ref 2.5–4.6)

## 2022-04-10 LAB — PROTIME-INR
INR: 1.8 — ABNORMAL HIGH (ref 0.8–1.2)
Prothrombin Time: 20.5 seconds — ABNORMAL HIGH (ref 11.4–15.2)

## 2022-04-10 LAB — TROPONIN I (HIGH SENSITIVITY): Troponin I (High Sensitivity): 132 ng/L (ref <18)

## 2022-04-10 LAB — APTT: aPTT: 48 seconds — ABNORMAL HIGH (ref 24–36)

## 2022-04-10 MED ORDER — NOREPINEPHRINE 4 MG/250ML-% IV SOLN: 0.0000 ug/min | INTRAVENOUS | Status: DC

## 2022-04-10 MED ORDER — CHLORHEXIDINE GLUCONATE CLOTH 2 % EX PADS
6.0000 | MEDICATED_PAD | Freq: Every day | CUTANEOUS | Status: DC
  Administered 2022-04-10 – 2022-04-11 (×3): 6 via TOPICAL

## 2022-04-10 MED ORDER — LACTATED RINGERS IV BOLUS
1000.0000 mL | Freq: Once | INTRAVENOUS | Status: AC
  Administered 2022-04-10: 1000 mL via INTRAVENOUS

## 2022-04-10 MED ORDER — EPINEPHRINE HCL 5 MG/250ML IV SOLN IN NS
INTRAVENOUS | Status: AC
  Administered 2022-04-10: 15 ug/min via INTRAVENOUS
  Filled 2022-04-10: qty 250

## 2022-04-10 MED ORDER — SODIUM BICARBONATE 8.4 % IV SOLN
100.0000 meq | Freq: Once | INTRAVENOUS | Status: AC
  Administered 2022-04-10: 100 meq via INTRAVENOUS
  Filled 2022-04-10: qty 100

## 2022-04-10 MED ORDER — NOREPINEPHRINE 16 MG/250ML-% IV SOLN
0.0000 ug/min | INTRAVENOUS | Status: DC
  Administered 2022-04-10: 2 ug/min via INTRAVENOUS
  Administered 2022-04-11: 40 ug/min via INTRAVENOUS
  Filled 2022-04-10 (×3): qty 250

## 2022-04-10 MED ORDER — EPINEPHRINE HCL 5 MG/250ML IV SOLN IN NS: 0.5000 ug/min | INTRAVENOUS | Status: DC

## 2022-04-10 MED ORDER — NOREPINEPHRINE 4 MG/250ML-% IV SOLN
0.0000 ug/min | INTRAVENOUS | Status: DC
  Administered 2022-04-10: 40 ug/min via INTRAVENOUS
  Filled 2022-04-10 (×2): qty 250

## 2022-04-10 MED ORDER — VASOPRESSIN 20 UNITS/100 ML INFUSION FOR SHOCK
0.0300 [IU]/min | INTRAVENOUS | Status: DC
  Administered 2022-04-10 – 2022-04-11 (×2): 0.03 [IU]/min via INTRAVENOUS
  Filled 2022-04-10 (×3): qty 100

## 2022-04-10 MED ORDER — EPINEPHRINE HCL 5 MG/250ML IV SOLN IN NS
0.5000 ug/min | INTRAVENOUS | Status: DC
  Administered 2022-04-10: 20 ug/min via INTRAVENOUS
  Administered 2022-04-10: 17 ug/min via INTRAVENOUS
  Filled 2022-04-10 (×3): qty 250

## 2022-04-10 MED ORDER — SODIUM CHLORIDE 0.9% FLUSH
10.0000 mL | Freq: Two times a day (BID) | INTRAVENOUS | Status: DC
  Administered 2022-04-10 – 2022-04-11 (×2): 10 mL

## 2022-04-10 MED ORDER — STERILE WATER FOR INJECTION IV SOLN
INTRAVENOUS | Status: DC
  Filled 2022-04-10 (×2): qty 1000

## 2022-04-10 MED ORDER — ORAL CARE MOUTH RINSE
15.0000 mL | OROMUCOSAL | Status: DC
  Administered 2022-04-10 – 2022-04-11 (×8): 15 mL via OROMUCOSAL

## 2022-04-10 MED ORDER — SODIUM CHLORIDE 0.9 % IV SOLN
2.0000 g | INTRAVENOUS | Status: DC
  Administered 2022-04-10: 2 g via INTRAVENOUS
  Filled 2022-04-10 (×2): qty 20

## 2022-04-10 MED ORDER — SODIUM CHLORIDE 0.9 % IV SOLN
250.0000 mL | INTRAVENOUS | Status: DC
  Administered 2022-04-10: 250 mL via INTRAVENOUS

## 2022-04-10 MED ORDER — AMIODARONE LOAD VIA INFUSION
150.0000 mg | Freq: Once | INTRAVENOUS | Status: DC
  Filled 2022-04-10: qty 83.34

## 2022-04-10 MED ORDER — AMIODARONE HCL IN DEXTROSE 360-4.14 MG/200ML-% IV SOLN
60.0000 mg/h | INTRAVENOUS | Status: DC
  Administered 2022-04-10: 60 mg/h via INTRAVENOUS
  Filled 2022-04-10 (×2): qty 200

## 2022-04-10 MED ORDER — PANTOPRAZOLE 2 MG/ML SUSPENSION
40.0000 mg | Freq: Every day | ORAL | Status: DC
  Administered 2022-04-10 – 2022-04-11 (×2): 40 mg
  Filled 2022-04-10 (×2): qty 20

## 2022-04-10 MED ORDER — POLYETHYLENE GLYCOL 3350 17 G PO PACK: 17.0000 g | PACK | Freq: Every day | ORAL | Status: DC | PRN

## 2022-04-10 MED ORDER — DOCUSATE SODIUM 50 MG/5ML PO LIQD
100.0000 mg | Freq: Two times a day (BID) | ORAL | Status: DC | PRN
Start: 2022-04-10 — End: 2022-04-11

## 2022-04-10 MED ORDER — SODIUM CHLORIDE 0.9 % IV SOLN
250.0000 mL | INTRAVENOUS | Status: DC
Start: 2022-04-10 — End: 2022-04-11
  Administered 2022-04-10: 250 mL via INTRAVENOUS

## 2022-04-10 MED ORDER — METRONIDAZOLE 500 MG/100ML IV SOLN
500.0000 mg | Freq: Two times a day (BID) | INTRAVENOUS | Status: DC
  Administered 2022-04-10 – 2022-04-11 (×2): 500 mg via INTRAVENOUS
  Filled 2022-04-10 (×2): qty 100

## 2022-04-10 MED ORDER — NOREPINEPHRINE 4 MG/250ML-% IV SOLN
INTRAVENOUS | Status: AC
  Administered 2022-04-10: 10 ug/min via INTRAVENOUS
  Filled 2022-04-10: qty 250

## 2022-04-10 MED ORDER — HEPARIN SODIUM (PORCINE) 5000 UNIT/ML IJ SOLN
5000.0000 [IU] | Freq: Three times a day (TID) | INTRAMUSCULAR | Status: DC
  Administered 2022-04-10 – 2022-04-11 (×2): 5000 [IU] via SUBCUTANEOUS
  Filled 2022-04-10 (×2): qty 1

## 2022-04-10 MED ORDER — SODIUM CHLORIDE 0.9% FLUSH: 10.0000 mL | INTRAVENOUS | Status: DC | PRN

## 2022-04-10 MED ORDER — AMIODARONE HCL IN DEXTROSE 360-4.14 MG/200ML-% IV SOLN: 30.0000 mg/h | INTRAVENOUS | Status: DC

## 2022-04-10 MED ORDER — ORAL CARE MOUTH RINSE: 15.0000 mL | OROMUCOSAL | Status: DC | PRN

## 2022-04-10 MED ORDER — EPINEPHRINE 1 MG/10ML IJ SOSY
PREFILLED_SYRINGE | INTRAMUSCULAR | Status: AC
  Filled 2022-04-10: qty 10

## 2022-04-10 MED ORDER — DOCUSATE SODIUM 100 MG PO CAPS: 100.0000 mg | ORAL_CAPSULE | Freq: Two times a day (BID) | ORAL | Status: DC | PRN

## 2022-04-10 NOTE — ED Notes (Addendum)
1311: Arrival to [facility] with CPR in progress; Lucas; king airway/BVM; pt arrived cyanotic  Per EMS, IO (blue) established in Right Tibia; Obstructed airway; 4 rounds of Epi given    1000 bolus NS; last epi given at 1306; pt asystole/ PEA  EDP suctioning airway using glidescope  1314 Epi given IO 1315 Airway established; 7.5 ett, 22 at the lip, positive color change 1316 BVM in progress; 20G IV LAC 1317 Pulse check: PEA, AED pads placed, resume CPR 1318 1 mg Epi given 1320 Pulse check: PEA; resume CPR 1321 Epi given 1323 Pulse check: v-fib; charge zoll; resume CPR 1324 200 J shock given; resume CPR 1325 Epi given IO, 1 g calcium IV 1326 Pulse check: v-fib; 200j shock given; CPR resumed 1329 Pulse check: v-fib; 200j shock given; CPR resumed 1331 2 g Magnesium started in IV; pulse check: v-fib 1332 200j shock given 1334 Pulse check: pulse felt; v-tach to v-fib; 200j shock given; CPR resumed; 93/76 (83) 1336 Pulse check: v-fib; 200j shock given, CPR resumed 1338 Pulse check: pulse felt, HR 102, [place] pulse noted, O2 98%, BP 41/15 (24) 1339 Pt went into v-fib, 200j shock given, CPR resumed 1341 Pulse check: pulse felt, pt in NSR, HR 103, BP 79/53 (62) 1342 150 amiodarone started 1344 25 mg/min Epi drip started IV  1344 Magnesium done; BP 66/41 (50), NSR 1346 Amiodarone finished, EKG captured 1347 20G right forearm established, retrieved blood labs 1348 Left AC IV removed 1349 Epi switched to Right arm IV 1353 22 G Right Hand established  Epi switched to IO 1357 BP 101/84 (91) 1400 Temp Foley established, no urine return 1414 Bladder scan, 21 ml noted Pupils fixed, non-reactive at this time  Per EDP, no warm blankets, prefers pt warm up naturally

## 2022-04-10 NOTE — ED Provider Notes (Signed)
Pt seen by Dr Doren Custard.  Please see his note.  Pt on peripheral vasopressors but requiring higher doses.  Plan is for central line.  Family at bedside agree with plan.  Linus Salmons Line  Date/Time: 04/14/2022 4:51 PM  Performed by: Dorie Rank, MD Authorized by: Dorie Rank, MD   Consent:    Consent obtained:  Emergent situation   Consent given by:  Healthcare agent Universal protocol:    Immediately prior to procedure, a time out was called: yes     Patient identity confirmed:  Hospital-assigned identification number Pre-procedure details:    Indication(s): central venous access     Hand hygiene: Hand hygiene performed prior to insertion     Sterile barrier technique: All elements of maximal sterile technique followed     Skin preparation:  Chlorhexidine   Skin preparation agent: Skin preparation agent completely dried prior to procedure   Sedation:    Sedation type:  None Anesthesia:    Anesthesia method:  None Procedure details:    Location:  R internal jugular   Procedural supplies:  Triple lumen   Landmarks identified: yes     Ultrasound guidance: yes     Ultrasound guidance timing: real time     Sterile ultrasound techniques: Sterile gel and sterile probe covers were used     Number of attempts:  1   Successful placement: yes   Post-procedure details:    Post-procedure:  Dressing applied and line sutured   Assessment:  Blood return through all ports, free fluid flow and placement verified by x-ray   Procedure completion:  Tolerated well, no immediate complications     Dorie Rank, MD 04/09/2022 1652

## 2022-04-10 NOTE — Progress Notes (Signed)
Pharmacy Antibiotic Note  [name] is a 56 y.o. female admitted on 05/04/2022 with  asp pna .  Pharmacy has been consulted for Unasyn dosing but pt with PCN allergy (rash), has tolerated IV cephalosporins in the past.   Noted SCr up to 1.18 (baseline 0.6-0.8), pt is bedbound so SCr likely not reflective of renal function. F/u UOP closely.   Plan: Discussed with Dr. Erin Fulling, okay to use Rocephin 2gm IV q24h and Flagyl '500mg'$  IV q12h Will continue to f/u daily on CCM rounds  Height: '5\' 2"'$  (157.5 cm) IBW/kg (Calculated) : 50.1  Temp (24hrs), Avg:94.4 F (34.7 C), Min:93.7 F (34.3 C), Max:95.5 F (35.3 C)  Recent Labs  Lab 04/08/2022 1348 04/09/2022 1530  WBC 9.1  --   CREATININE 1.18*  --   LATICACIDVEN  --  >9.0*    CrCl cannot be calculated (Unknown ideal weight.).    Allergies  Allergen Reactions   Ambien [Zolpidem Tartrate] Other (See Comments)    Crazy dreams   Ceftin [Cefuroxime Axetil] Nausea And Vomiting    Pt tolerated Cefepime 09/2014 admission   Hctz [Hydrochlorothiazide] Other (See Comments)    Urinary retention   Lodine [Etodolac] Hives   Promethazine Other (See Comments)    Hallucinations.    Codeine Rash   Latex Rash   Penicillins Rash    Antimicrobials this admission: 7/3 Rocephin >>  7/3 Flagyl >>   Microbiology results: 7/3 BCx: 7/2 UCx: MRSA PCR:   Thank you for allowing pharmacy to be a part of this patient's care.  Sherlon Handing, PharmD, BCPS Please see amion for complete clinical pharmacist phone list 05/08/2022 6:31 PM

## 2022-04-10 NOTE — ED Notes (Signed)
Urine noted in bag

## 2022-04-10 NOTE — ED Notes (Addendum)
[facility] loading up patient Purple band placed on pt

## 2022-04-10 NOTE — ED Notes (Signed)
[facility] at bedside to transfer pt to [facility].
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
## 2022-04-10 NOTE — Progress Notes (Signed)
Pt bladder temperature 34.2 degrees celsius, Bair Hugger applied to pt. Dr. Erin Fulling at pt bedside and aware.

## 2022-04-10 NOTE — Progress Notes (Signed)
[facility] Progress Note Patient Name: [name] DOB: 11/02/1965 MRN: [id number]   Date of Service  04/08/2022  HPI/Events of Note  Notified of follow up ABG results 7.035/34.8/96 on TV 400, rate 20, 60%, PEEP 5.  Lactate still >9 Pt remains on epinephrine, levophed and vasopressin gtts.  eICU Interventions  Give HCO3 push at 156mq.  Start on HCO3 gtt in sterile water at 100cc/hr. Titrate FiO2 accordingly.       Intervention Category Intermediate Interventions: Other:  VElsie Lincoln7/12/2021, 9:00 PM

## 2022-04-10 NOTE — H&P (Addendum)
NAME:  [name], MRN:  [id number], DOB:  08-Feb-1966, LOS: 0 ADMISSION DATE:  04/09/2022, CONSULTATION DATE:  05/04/2022 REFERRING MD: Godfrey Pick, MD CHIEF COMPLAINT:  cardiac arrest   History of Present Illness:  [name] is a 56 year old woman, former smoker with neuromuscular disorder, CVA, factor V Leiden mutation, depression, GERD, dysphagia, COPD and obesity who presented to [facility] after respiratory arrest from aspiration event due to eating a biscuit which lead to cardiac arrest. EMS arrived and she was in PEA arrest. She received 30 to 40 minutes of compressions by the EMS team and another 20 minutes of CPR after arrival to the [facility] ER. She was given 6 doses of epinephrine. She was intubated.   PCCM was called for transfer to [facility] for higher level of care. She was maintained on levophed and epinephrine drips. Right IJ CVL was placed.   Initial ABG 7.16, pCO2 25, pO2 178. Lactic Acid was >9.   Pertinent  Medical History  Neuromuscular disorder COPD Dysphagia GERD Obesity  Significant Hospital Events: Including procedures, antibiotic start and stop dates in addition to other pertinent events   7/3 presented to [facility] after resp/cardiac arrest. Transferred to [facility].   Interim History / Subjective:   Per family she has been bed ridden and has a full time care giver who helps her with lift from bed to chair daily. She requires assistance with her ADLs. She was made DNR by family after discussion in the ER at [facility].  Objective   Blood pressure (!) 109/53, pulse (!) 109, temperature (!) 93.7 F (34.3 C), resp. rate (!) 21, SpO2 100 %.    Vent Mode: PRVC FiO2 (%):  [100 %] 100 % Set Rate:  [20 bmp] 20 bmp Vt Set:  [503 mL] 503 mL PEEP:  [5 cmH20] 5 cmH20   Intake/Output Summary (Last 24 hours) at 04/26/2022 1739 Last data filed at 04/09/2022 1421 Gross per 24 hour  Intake 0 ml  Output --  Net 0 ml   There were no vitals filed for this visit.  Examination: General: critically ill  appearing woman, intubated, sedated HENT: [place]/AT, ET tube and OG in place, moist mucous membranes Lungs: course breath sounds, no wheezing Cardiovascular: tachycardic, no murmurs Abdomen: soft, non-distended, hypoactive bowel sounds Extremities: cool to touch, no edema Neuro: Pupils dilated and not reactive to light. Negative dolls eye. No cough to deep suction. Does not trigger breaths on vent.  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 Add vasopressin. Wean epinephrine first if able.  - Give 1L bolus of LR - Start ceftriaxone + flagyl for aspiration pneumonia coverage - Monitor renal function and UOP - Monitor liver enzymes - Check CT head. High concern for severe anoxic brain injury based on lack of pupillary reflex, cough reflex and negative dolls eye along with not triggering of ventilator. - Hold sedation unless becomes agitated.  Overall very poor prognosis given significant down time prior to return of spontaneous circulation.   Best Practice (right click  and "Reselect all SmartList Selections" daily)   Diet/type: NPO DVT prophylaxis: SCD GI prophylaxis: PPI Lines: Central line Foley:  Yes, and it is still needed Code Status:  DNR Last date of multidisciplinary goals of care discussion [7/3 by ER team at [place]: Recent Labs  Lab 04/12/2022 1348  WBC 9.1  HGB 11.0*  HCT 35.7*  MCV 117.4*  PLT 136*     Basic Metabolic Panel: Recent Labs  Lab 04/23/2022 1348  NA 140  K 4.0  CL 106  CO2 18*  GLUCOSE 387*  BUN 10  CREATININE 1.18*  CALCIUM 9.8  MG 3.6*  PHOS 9.3*   GFR: CrCl cannot be calculated (Unknown ideal weight.). Recent Labs  Lab 04/09/2022 1348 04/09/2022 1530  WBC 9.1  --   LATICACIDVEN  --  >9.0*    Liver Function Tests: Recent Labs  Lab 04/08/2022 1348  AST 146*  ALT 77*  ALKPHOS 79  BILITOT 0.3  PROT 4.4*  ALBUMIN 2.0*   No results for input(s): "LIPASE", "AMYLASE" in the last 168 hours. No results for input(s): "AMMONIA" in the last 168 hours.  ABG    Component Value Date/Time   PHART 7.16 (LL) 04/28/2022 1640   PCO2ART 25 (L) 04/30/2022 1640   PO2ART 178 (H) 04/23/2022 1640   HCO3 9.3 (L) 04/27/2022 1640   TCO2 25 10/09/2014 1422   ACIDBASEDEF 18.5 (H) 04/22/2022 1640   O2SAT 98.8 04/09/2022 1640     Coagulation Profile: Recent Labs  Lab 05/07/2022 1348  INR 1.8*    Cardiac Enzymes: No results for input(s): "CKTOTAL", "CKMB", "CKMBINDEX", "TROPONINI" in the last 168 hours.  HbA1C: Hgb A1c MFr Bld  Date/Time Value Ref Range Status  08/27/2020 01:00 AM 6.0 (H) 4.8 - 5.6 % Final    Comment:    (NOTE) Pre diabetes:          5.7%-6.4%  Diabetes:              >6.4%  Glycemic control for   <7.0% adults with diabetes   10/04/2018 02:05 PM 5.6 4.8 - 5.6 % Final    Comment:             Prediabetes: 5.7 - 6.4          Diabetes: >6.4          Glycemic control for adults with diabetes: <7.0     CBG: No results for input(s): "GLUCAP" in the last 168 hours.  Review of Systems:   Unable to perform ROS due to mental status.  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   Surgical History:   Past Surgical History:  Procedure Laterality Date   ABDOMINAL HYSTERECTOMY  ~ 1987; ~ 2004   "[place]; [place]" (02/11/2013)   ANTERIOR CERVICAL DECOMP/DISCECTOMY FUSION  2009; 2011   APPENDECTOMY     CATARACT EXTRACTION W/PHACO  06/20/2011   Procedure: CATARACT EXTRACTION PHACO AND INTRAOCULAR LENS PLACEMENT ([place]);  Surgeon: [name] T. Gershon Crane;  Location: [facility]  ORS;  Service: Ophthalmology;  Laterality: Left;  CDE 1.81   CATARACT EXTRACTION W/PHACO  07/04/2011   Procedure: CATARACT EXTRACTION PHACO AND INTRAOCULAR LENS PLACEMENT (IOC);  Surgeon: [name] T. Gershon Crane;  Location: [facility] ORS;  Service: Ophthalmology;  Laterality: Right;  CDE: 1.76   CESAREAN SECTION  1982; 1984; 1986   EMBOLECTOMY Left 09/27/2014   Procedure: Left Brachial, Radial,Ulnar Embolectomy with patch angioplasty left brachial, radial and ulnar artery;  Surgeon: [name], MD;  Location: [place] OR;  Service: Vascular;  Laterality: Left;   EMBOLECTOMY Left 09/27/2014   Procedure: Left Radial, Brachial, and Ulnar Thrombectomy; Left Brachial to Radial Bypass Graft using Greater Saphenous vein graft from Left Thigh; Left Saphenous Vein Harvest; Intraoperative Arteriogram; Intra-arterial administration of TPA;  Surgeon: [name], MD;  Location: [facility] OR;  Service: Vascular;  Laterality: Left;   LAPAROTOMY N/A 09/27/2014   Procedure: Exploratory Laparotomy, Biopsy of Perforated Gastric Ulcer, Closure with omental patch;  Surgeon: [name], MD;  Location: [place];  Service: General;  Laterality: N/A;    TONSILLECTOMY  1990's?   TRACHEOSTOMY N/A december 2015   YAG LASER APPLICATION Right 5/53/7482   Procedure: YAG LASER APPLICATION;  Surgeon: [name] T. Gershon Crane, MD;  Location: [facility] ORS;  Service: Ophthalmology;  Laterality: Right;   YAG LASER APPLICATION Left 04/14/8674   Procedure: YAG LASER APPLICATION;  Surgeon: [name] T. Gershon Crane, MD;  Location: [facility] ORS;  Service: Ophthalmology;  Laterality: Left;     Social History:   reports that she has quit smoking. Her smoking use included cigarettes. She has a 33.00 pack-year smoking history. She quit smokeless tobacco use about 7 years ago. She reports that she does not drink alcohol and does not use drugs.   Family History:  Her family history includes Cancer in her maternal grandmother and mother; Diabetes in her father; Heart disease in her father and mother; Hyperlipidemia in her mother. There is no history of Anesthesia problems, Hypotension, Malignant hyperthermia, or Pseudochol deficiency.   Allergies Allergies  Allergen Reactions   Ambien [Zolpidem Tartrate] Other (See Comments)    Crazy dreams   Ceftin [Cefuroxime Axetil] Nausea And Vomiting    Pt tolerated Cefepime 09/2014 admission   Hctz [Hydrochlorothiazide] Other (See Comments)    Urinary retention   Lodine [Etodolac] Hives   Promethazine Other (See Comments)    Hallucinations.    Codeine Rash   Latex Rash   Penicillins Rash     Home Medications  Prior to Admission medications   Medication Sig Start Date End Date Taking? Authorizing Provider  apixaban (ELIQUIS) 5 MG TABS tablet TAKE (1) TABLET BY MOUTH TWICE DAILY. 03/07/22   Kathyrn Drown, MD  ACCU-CHEK FASTCLIX LANCETS MISC USE TO TEST ONCE DAILY. Patient taking differently: 1 strip by Other route daily. 09/18/16   Kathyrn Drown, MD  albuterol (VENTOLIN HFA) 108 (90 Base) MCG/ACT inhaler INHALE 2 PUFFS EVERY 6 HOURS AS NEEDED. 05/27/21   Kathyrn Drown, MD  blood glucose meter kit and supplies KIT Dispense based on patient  and insurance preference. May test once a day due to prediabetes 01/13/21   Kathyrn Drown, MD  blood glucose meter kit and supplies Dispense based on patient and insurance preference. Use to check blood sugar once daily. (FOR ICD-10 E11.9). 01/14/21   Kathyrn Drown, MD  Calcium Carbonate (CALCIUM 600 PO) Take 2 tablets by mouth daily.     [provider]  gabapentin (NEURONTIN) 300 MG capsule One tid 02/09/22  [name], [name] A, MD  glucose blood (ONE TOUCH ULTRA TEST) test strip USE TO TEST ONCE DAILY. Patient taking differently: 1 each by Other route daily. 07/05/18   Kathyrn Drown, MD  hydrOXYzine (VISTARIL) 25 MG capsule TAKE 1 CAPSULE BY MOUTH THREE TIMES A DAY AS NEEDED. 03/22/22   Kathyrn Drown, MD  naloxone [facility]) nasal spray 4 mg/0.1 mL Use as directed Patient taking differently: Place 0.4 mg into the nose once. Use as directed 06/08/20   Kathyrn Drown, MD  oxyCODONE-acetaminophen (PERCOCET/ROXICET) 5-325 MG tablet 1 q4 prn pain max 6 per day 02/09/22   Kathyrn Drown, MD  oxyCODONE-acetaminophen (PERCOCET/ROXICET) 5-325 MG tablet May take 1 every 4 hours as needed for pain maximum 6/day 02/09/22   Kathyrn Drown, MD  oxyCODONE-acetaminophen (PERCOCET/ROXICET) 5-325 MG tablet 1 q4 hours prn pain max 6 a day 02/09/22   Kathyrn Drown, MD  pantoprazole (PROTONIX) 40 MG tablet TAKE 1 TABLET BY MOUTH ONCE DAILY. 08/09/21   Kathyrn Drown, MD  potassium chloride (KLOR-CON) 10 MEQ tablet Take 1 tablet (10 mEq total) by mouth 2 (two) times daily. 10/27/21   Johnson, Clanford L, MD  thiamine (VITAMIN B-1) 100 MG tablet Take 1 tablet (100 mg total) by mouth daily. 08/29/20   Roxan Hockey, MD  traZODone (DESYREL) 100 MG tablet TAKE 1 AND A HALF TABLET BY MOUTH AT BEDTIME. 01/22/22   [name], Elayne Snare, MD  vitamin B-12 (CYANOCOBALAMIN) 1000 MCG tablet Take 1,000 mcg by mouth daily.    [provider]     Critical care time: 76 minutes    Freda Jackson, MD Welcome Office: [phone]   See Amion for personal pager PCCM on call pager [phone] until 7pm. Please call Elink 7p-7a. [phone]

## 2022-04-10 NOTE — ED Notes (Signed)
ED Provider at bedside. 

## 2022-04-10 NOTE — Progress Notes (Signed)
PCCM service at [facility]  Pt seen and examined in ER following prolonged CPR and ROSC but levophed/ epi dependent and not breathing over vent / no response to verbal stimuli  Son says she is remote smoker, now bed ridden chronically due to some form of undx'd neuromusuclar disoder and hoyered to chair daily, requiring and feeding with intermittent changes in voice texture and cough suggestive of intermitttent asp but no direct witnesses available prior to arrest per nursing/ presumed asp   Present problems:  1) Vent dep sp prolong arrest with likely severe anoxic injury so at risk of myoclonus/ sz disorder with very poor prognosis for functional recovery and still full code per son "from what he knows"   2) Pressor dep s/p prolonged cpr ? IHD ? Sepsis ? End organ ischemia esp cns/ splancchnic circution/ kidneys  3) Extremely poor baseline health status / extremely debilitated ? NM dz (B12 def may have contributed per neuro notes in 2012/ wfu) > now bedridden/ totally dependent for all adls - our last admit was 10/2014 (trach dep at that time)  Extremely poor prognosis shared with son who wants to wait a few days to make decision re Code status going forward but don't believe escalating care further from this point anyway will make any difference as will rapidly approach futility level if not responding to present rx.  Agree with tx to [facility] for PCCM management of shock/ resp failure and serial neuro eval  [facility] on way, report already given by nursing  Christinia Gully, MD Pulmonary and [place] [phone]   After 7:00 pm call Elink  [phone]

## 2022-04-11 ENCOUNTER — Inpatient Hospital Stay (HOSPITAL_COMMUNITY): Payer: BC Managed Care – PPO

## 2022-04-11 DIAGNOSIS — E872 Acidosis, unspecified: Secondary | ICD-10-CM | POA: Diagnosis not present

## 2022-04-11 DIAGNOSIS — G936 Cerebral edema: Secondary | ICD-10-CM

## 2022-04-11 DIAGNOSIS — Z9911 Dependence on respirator [ventilator] status: Secondary | ICD-10-CM | POA: Diagnosis not present

## 2022-04-11 DIAGNOSIS — J9601 Acute respiratory failure with hypoxia: Secondary | ICD-10-CM | POA: Diagnosis not present

## 2022-04-11 DIAGNOSIS — I469 Cardiac arrest, cause unspecified: Secondary | ICD-10-CM | POA: Diagnosis not present

## 2022-04-11 LAB — BASIC METABOLIC PANEL
Anion gap: 23 — ABNORMAL HIGH (ref 5–15)
BUN: 15 mg/dL (ref 6–20)
CO2: 16 mmol/L — ABNORMAL LOW (ref 22–32)
Calcium: 7.8 mg/dL — ABNORMAL LOW (ref 8.9–10.3)
Chloride: 100 mmol/L (ref 98–111)
Creatinine, Ser: 2.17 mg/dL — ABNORMAL HIGH (ref 0.44–1.00)
GFR, Estimated: 26 mL/min — ABNORMAL LOW (ref >60)
Glucose, Bld: 458 mg/dL — ABNORMAL HIGH (ref 70–99)
Potassium: 3.7 mmol/L (ref 3.5–5.1)
Sodium: 139 mmol/L (ref 135–145)

## 2022-04-11 LAB — POCT I-STAT EG7
Acid-base deficit: 14 mmol/L — ABNORMAL HIGH (ref 0.0–2.0)
Bicarbonate: 15.9 mmol/L — ABNORMAL LOW (ref 20.0–28.0)
Calcium, Ion: 1.14 mmol/L — ABNORMAL LOW (ref 1.15–1.40)
HCT: 39 % (ref 36.0–46.0)
Hemoglobin: 13.3 g/dL (ref 12.0–15.0)
O2 Saturation: 64 %
Potassium: 3.4 mmol/L — ABNORMAL LOW (ref 3.5–5.1)
Sodium: 139 mmol/L (ref 135–145)
TCO2: 17 mmol/L — ABNORMAL LOW (ref 22–32)
pCO2, Ven: 51 mmHg (ref 44–60)
pH, Ven: 7.102 — CL (ref 7.25–7.43)
pO2, Ven: 45 mmHg (ref 32–45)

## 2022-04-11 LAB — LACTIC ACID, PLASMA: Lactic Acid, Venous: >9 mmol/L (ref 0.5–1.9)

## 2022-04-11 LAB — POCT I-STAT 7, (LYTES, BLD GAS, ICA,H+H)
Acid-base deficit: 19 mmol/L — ABNORMAL HIGH (ref 0.0–2.0)
Bicarbonate: 10.2 mmol/L — ABNORMAL LOW (ref 20.0–28.0)
Calcium, Ion: 1.14 mmol/L — ABNORMAL LOW (ref 1.15–1.40)
HCT: 40 % (ref 36.0–46.0)
Hemoglobin: 13.6 g/dL (ref 12.0–15.0)
O2 Saturation: 88 %
Patient temperature: 99.1
Potassium: 3.9 mmol/L (ref 3.5–5.1)
Sodium: 136 mmol/L (ref 135–145)
TCO2: 11 mmol/L — ABNORMAL LOW (ref 22–32)
pCO2 arterial: 35 mmHg (ref 32–48)
pH, Arterial: 7.076 — CL (ref 7.35–7.45)
pO2, Arterial: 76 mmHg — ABNORMAL LOW (ref 83–108)

## 2022-04-11 LAB — CBC
HCT: 41.2 % (ref 36.0–46.0)
Hemoglobin: 13.7 g/dL (ref 12.0–15.0)
MCH: 36 pg — ABNORMAL HIGH (ref 26.0–34.0)
MCHC: 33.3 g/dL (ref 30.0–36.0)
MCV: 108.1 fL — ABNORMAL HIGH (ref 80.0–100.0)
Platelets: 209 10*3/uL (ref 150–400)
RBC: 3.81 MIL/uL — ABNORMAL LOW (ref 3.87–5.11)
RDW: 12.9 % (ref 11.5–15.5)
WBC: 3.7 10*3/uL — ABNORMAL LOW (ref 4.0–10.5)
nRBC: 4.6 % — ABNORMAL HIGH (ref 0.0–0.2)

## 2022-04-11 LAB — GLUCOSE, CAPILLARY
Glucose-Capillary: 351 mg/dL — ABNORMAL HIGH (ref 70–99)
Glucose-Capillary: 370 mg/dL — ABNORMAL HIGH (ref 70–99)
Glucose-Capillary: 379 mg/dL — ABNORMAL HIGH (ref 70–99)
Glucose-Capillary: 380 mg/dL — ABNORMAL HIGH (ref 70–99)
Glucose-Capillary: 403 mg/dL — ABNORMAL HIGH (ref 70–99)
Glucose-Capillary: 417 mg/dL — ABNORMAL HIGH (ref 70–99)
Glucose-Capillary: 438 mg/dL — ABNORMAL HIGH (ref 70–99)
Glucose-Capillary: 453 mg/dL — ABNORMAL HIGH (ref 70–99)
Glucose-Capillary: 468 mg/dL — ABNORMAL HIGH (ref 70–99)

## 2022-04-11 LAB — MAGNESIUM: Magnesium: 1.7 mg/dL (ref 1.7–2.4)

## 2022-04-11 LAB — PHOSPHORUS: Phosphorus: 3.1 mg/dL (ref 2.5–4.6)

## 2022-04-11 MED ORDER — LACTATED RINGERS IV BOLUS
500.0000 mL | Freq: Once | INTRAVENOUS | Status: AC
  Administered 2022-04-11: 500 mL via INTRAVENOUS

## 2022-04-11 MED ORDER — GLYCOPYRROLATE 0.2 MG/ML IJ SOLN: 0.2000 mg | INTRAMUSCULAR | Status: DC | PRN

## 2022-04-11 MED ORDER — DEXTROSE 50 % IV SOLN: 0.0000 mL | INTRAVENOUS | Status: DC | PRN

## 2022-04-11 MED ORDER — FENTANYL 2500MCG IN NS 250ML (10MCG/ML) PREMIX INFUSION
0.0000 ug/h | INTRAVENOUS | Status: DC
  Administered 2022-04-11: 50 ug/h via INTRAVENOUS
  Filled 2022-04-11: qty 250

## 2022-04-11 MED ORDER — SODIUM CHLORIDE 0.9 % IV SOLN: INTRAVENOUS | Status: DC

## 2022-04-11 MED ORDER — ACETAMINOPHEN 325 MG PO TABS: 650.0000 mg | ORAL_TABLET | Freq: Four times a day (QID) | ORAL | Status: DC | PRN

## 2022-04-11 MED ORDER — GLYCOPYRROLATE 0.2 MG/ML IJ SOLN
0.2000 mg | INTRAMUSCULAR | Status: DC | PRN
  Filled 2022-04-11: qty 1

## 2022-04-11 MED ORDER — MIDAZOLAM HCL 2 MG/2ML IJ SOLN
2.0000 mg | INTRAMUSCULAR | Status: DC | PRN
  Filled 2022-04-11: qty 2

## 2022-04-11 MED ORDER — FENTANYL BOLUS VIA INFUSION: 100.0000 ug | INTRAVENOUS | Status: DC | PRN

## 2022-04-11 MED ORDER — INSULIN ASPART 100 UNIT/ML IJ SOLN
15.0000 [IU] | Freq: Once | INTRAMUSCULAR | Status: AC
  Administered 2022-04-11: 15 [IU] via SUBCUTANEOUS

## 2022-04-11 MED ORDER — INSULIN REGULAR(HUMAN) IN NACL 100-0.9 UT/100ML-% IV SOLN
INTRAVENOUS | Status: DC
  Administered 2022-04-11: 4.6 [IU]/h via INTRAVENOUS
  Filled 2022-04-11 (×2): qty 100

## 2022-04-11 MED ORDER — ACETAMINOPHEN 650 MG RE SUPP: 650.0000 mg | Freq: Four times a day (QID) | RECTAL | Status: DC | PRN

## 2022-04-11 MED ORDER — SODIUM BICARBONATE 8.4 % IV SOLN
100.0000 meq | Freq: Once | INTRAVENOUS | Status: AC
  Administered 2022-04-11: 100 meq via INTRAVENOUS
  Filled 2022-04-11: qty 100

## 2022-04-11 MED ORDER — GLYCOPYRROLATE 1 MG PO TABS: 1.0000 mg | ORAL_TABLET | ORAL | Status: DC | PRN

## 2022-04-11 MED ORDER — INSULIN ASPART 100 UNIT/ML IJ SOLN: 2.0000 [IU] | INTRAMUSCULAR | Status: DC

## 2022-04-11 MED ORDER — POLYVINYL ALCOHOL 1.4 % OP SOLN: 1.0000 [drp] | Freq: Four times a day (QID) | OPHTHALMIC | Status: DC | PRN

## 2022-04-13 LAB — PATHOLOGIST SMEAR REVIEW

## 2022-04-15 LAB — CULTURE, BLOOD (ROUTINE X 2)
Culture: NO GROWTH
Special Requests: ADEQUATE

## 2022-05-09 NOTE — IPAL (Signed)
  Interdisciplinary Goals of Care Family Meeting   Date carried out:: May 02, 2022  Location of the meeting: Bedside  Member's involved: Physician and Family Member or next of kin  Durable Power of Attorney or Loss adjuster, chartered: 2 sons    Discussion: We discussed goals of care for [employer] .  I reviewed her care with her 2 sons at bedside.  They understand that she has sustained significant brain injury and likely would progress to brain death.  Due to severe multiorgan failure she will not survive this admission.  Her son is understanding of this.  We discussed possibilities for transitioning her to comfort care.  She has additional family want to come visit today.  At this point no escalation and family will decide if they wish to pursue comfort care later today.  Family is interested in having chaplain come to bedside.  Code status: Full DNR  Disposition: con't current measures until family comes, no escalation, may withdraw after additional family members have visited.   Time spent for the meeting: 10 min.  Julian Hy 05/02/2022, 10:46 AM

## 2022-05-09 NOTE — Progress Notes (Addendum)
eLink Physician-Brief Progress Note Patient Name: [name] DOB: 12/06/1965 MRN: [id number]   Date of Service  05/10/2022  HPI/Events of Note  ABG shows persistent metabolic acidosis pH 6.269/48/54 on HCO3 gtt.   No reflexes as per bedside RN.   eICU Interventions  Get CT head frist.  Give HCO3 pushes and increase HCO3 gtt to 150cc.hr.  Recommend CTA abdomen and pelvis for possible ischemic colitis.  Pt may need CRRT for intractable metabolic acidosis.      Intervention Category Major Interventions: Other:  Elsie Lincoln 05/10/22, 5:44 AM  6:45 AM Received call from radiology regarding CT head that patient has diffuse anoxic brain injury that portends to grim prognosis.

## 2022-05-09 NOTE — Progress Notes (Addendum)
eLink Physician-Brief Progress Note Patient Name: [name] DOB: 12-19-1965 MRN: [id number]   Date of Service  04/23/22  HPI/Events of Note  Informed of hyperglycemia with glucose at 498. Pt also with no urine output and bladder scan revealing no urine.   eICU Interventions  Start on insulin sliding scale q4hrs.  Give 15 units novolog now.  Give 500cc LR bolus and see if urine output improves.     Intervention Category Intermediate Interventions: Other:  [name] Apr 23, 2022, 12:30 AM  4:21 AM Glucose remains in the 400s.   Plan> Start on insulin gtt.

## 2022-05-09 NOTE — Death Summary Note (Signed)
DEATH SUMMARY   Patient Details  Name: [name] MRN: [id number] DOB: 1966-01-09  Admission/Discharge Information   Admit Date:  04/21/2022  Date of Death: Date of Death: April 22, 2022  Time of Death: Time of Death: 41  Length of Stay: 1  Referring Physician: Kathyrn Drown, MD   Reason(s) for Hospitalization  Acute respiratory failure  Diagnoses  Preliminary cause of death:  Secondary Diagnoses (including complications and co-morbidities):  Principal Problem:   Cardiac arrest [facility]) Respiratory Arrest due to choking episode PEA Cardiac Arrest Shock-septic, likely due to mesenteric ischemia from prolonged resuscitation Acute Hypoxemic Respiratory Failure, ARDS Aspiration Pneumonia Hypothermia Acute anoxic encephalopathy Severe Anoxic Brain Injury Severe cerebral edema with brain compression/herniation Anion Gap Metabolic Acidosis Severe Lactic Acidosis Acute Kidney Injury Elevated Liver Enzymes - high risk for shock liver Elevated Troponin Anemia, Chronic Thrombocytopenia, chronic  Hyperglycemia  Brief Hospital Course (including significant findings, care, treatment, and services provided and events leading to death)  [name] is a 56 y.o. year old female with a history of former tobacco abuse with neuromuscular disorder, CVA, factor V Leiden mutation, depression, GERD, dysphagia, COPD and obesity who presented to [facility] after respiratory arrest from aspiration event due to eating a biscuit which lead to cardiac arrest. EMS arrived and she was in PEA arrest. She received 30 to 40 minutes of compressions by the EMS team and another 20 minutes of CPR after arrival to the [facility] ER. She was given 6 doses of epinephrine. She was intubated.    PCCM was called for transfer to [facility] for higher level of care. She was maintained on levophed and epinephrine drips. Right IJ CVL was placed.    Initial ABG 7.16, pCO2 25, pO2 178. Lactic Acid was >9.   Initial head CT demonstrated severe  cerebral edema and beginning stages of tonsillar herniation. She developed aggressive respiratory failure requiring escalating oxygen requirements and PEEP.  She developed refractory shock.  In discussion with her sons regarding her ongoing care and prognosis, they made the decision to withdraw aggressive care and focus on comfort.  She passed away with family bedside.  Pertinent Labs and Studies  Significant Diagnostic Studies CT HEAD WO CONTRAST (5MM)  Addendum Date: 2022-04-22   ADDENDUM REPORT: 22-Apr-2022 08:14 ADDENDUM: Study discussed by telephone with Dr. Elsie Lincoln on 04/22/22 at 0645 hours. Electronically Signed   By: Genevie Ann M.D.   On: 04-22-2022 08:14   Result Date: 04/22/22 CLINICAL DATA:  56 year old female status post choking episode. Cardiac arrest. "Anoxic brain damage". EXAM: CT HEAD WITHOUT CONTRAST TECHNIQUE: Contiguous axial images were obtained from the base of the skull through the vertex without intravenous contrast. RADIATION DOSE REDUCTION: This exam was performed according to the departmental dose-optimization program which includes automated exposure control, adjustment of the mA and/or kV according to patient size and/or use of iterative reconstruction technique. COMPARISON:  Brain MRI 04/03/2014.  Head CT 10/25/2021. FINDINGS: Brain: Diffuse severe brain edema, loss of sulci, loss of normal gray-white matter differentiation throughout the brain. Compressed ventricular system, effaced basilar cisterns, early tonsillar herniation (sagittal image 32). Pseudo subarachnoid appearance of diffuse intracranial venous congestion. No convincing intracranial hemorrhage. Vascular: Prominent vascular density throughout in the setting of severe anoxic injury. Skull: No acute osseous abnormality identified. Sinuses/Orbits: Intubated on the scout view. Paranasal sinuses and mastoids remain well aerated. Other: No acute orbit or scalp soft tissue finding. IMPRESSION: Severe diffuse Anoxic Brain  Injury.  Early tonsillar herniation. Electronically Signed: By: Herminio Heads.D.  On: May 03, 2022 06:34   DG Chest Port 1V same Day  Result Date: 04/08/2022 CLINICAL DATA:  Central line placement, endotracheal tube placement, OG tube placement. EXAM: PORTABLE CHEST 1 VIEW COMPARISON:  Chest x-ray from earlier same day and chest x-ray dated 10/25/2021. FINDINGS: Endotracheal tube appears well positioned with tip just above the level of the carina. Enteric tube passes below the diaphragm. RIGHT IJ central line appears adequately positioned with tip at the level of the mid SVC. Pacer pad overlies the LEFT heart. Mild patchy opacities within the mid and lower lung zones bilaterally, likely atelectasis. No pleural effusion or pneumothorax is seen. IMPRESSION: 1. Endotracheal tube well positioned with tip just above the level of the carina. 2. Enteric tube passes below the diaphragm. 3. RIGHT IJ central line appears adequately positioned with tip at the level of the mid SVC. 4. Stable mild bilateral perihilar and bibasilar atelectasis. No new lung findings. Electronically Signed   By: Franki Cabot M.D.   On: 05/04/2022 16:56   DG Abd Portable 1V  Result Date: 05/07/2022 CLINICAL DATA:  Choking episode today EXAM: PORTABLE ABDOMEN - 1 VIEW COMPARISON:  CT 10/25/2021 FINDINGS: Gas is present within the stomach, small bowel and large bowel, but the pattern does not suggest obstruction. Moderate amount of fecal matter throughout the colon. No abnormal calcifications or acute bone findings. IMPRESSION: Gas present throughout the intestine, but without obstruction pattern. No sign of free air. Electronically Signed   By: Nelson Chimes M.D.   On: 04/19/2022 14:52   DG Chest Port 1 View  Result Date: 05/07/2022 CLINICAL DATA:  Choked today.  Endotracheal placement. EXAM: PORTABLE CHEST 1 VIEW COMPARISON:  10/25/2021 FINDINGS: Endotracheal tube tip is 1 cm above the carina. Support device artifact overlies the chest. Low lung  volumes possibly due to phase of ventilation. Mild patchy density at the lung bases could be atelectasis or mild basilar infiltrate/aspiration. IMPRESSION: Endotracheal tube tip 1 cm above the carina. Low lung volumes. Question if this is due to phase of ventilation or if there is some true bibasilar atelectasis or patchy infiltrate. Electronically Signed   By: Nelson Chimes M.D.   On: 04/23/2022 14:51    Microbiology Recent Results (from the past 240 hour(s))  Culture, blood (Routine X 2) w Reflex to ID Panel     Status: None (Preliminary result)   Collection Time: 04/20/2022  3:31 PM   Specimen: BLOOD LEFT HAND  Result Value Ref Range Status   Specimen Description BLOOD LEFT HAND  Final   Special Requests   Final    BOTTLES DRAWN AEROBIC AND ANAEROBIC Blood Culture adequate volume   Culture   Final    NO GROWTH < 24 HOURS Performed at [facility], [address] [place], [place] [address]    Report Status PENDING  Incomplete  MRSA Next Gen by PCR, Nasal     Status: None   Collection Time: 04/16/2022  6:39 PM   Specimen: Nasal Mucosa; Nasal Swab  Result Value Ref Range Status   MRSA by PCR Next Gen NOT DETECTED NOT DETECTED Final    Comment: (NOTE) The GeneXpert MRSA Assay (FDA approved for NASAL specimens only), is one component of a comprehensive MRSA colonization surveillance program. It is not intended to diagnose MRSA infection nor to guide or monitor treatment for MRSA infections. Test performance is not FDA approved in patients less than 54 years old. Performed at [facility], [place] [address] [place], [place] [address]  Lab Basic Metabolic Panel: Recent Labs  Lab 04/30/2022 1348 04/09/2022 1839 04/09/2022 2025 2022/04/12 0116 Apr 12, 2022 0356 04-12-2022 0525  NA 140  --  134* 139 139 136  K 4.0  --  3.8 3.4* 3.7 3.9  CL 106  --   --   --  100  --   CO2 18*  --   --   --  16*  --   GLUCOSE 387*  --   --   --  458*  --   BUN 10  --   --   --  15  --   CREATININE 1.18*  1.56*  --   --  2.17*  --   CALCIUM 9.8  --   --   --  7.8*  --   MG 3.6*  --   --   --  1.7  --   PHOS 9.3*  --   --   --  3.1  --    Liver Function Tests: Recent Labs  Lab 05/07/2022 1348  AST 146*  ALT 77*  ALKPHOS 79  BILITOT 0.3  PROT 4.4*  ALBUMIN 2.0*   No results for input(s): "LIPASE", "AMYLASE" in the last 168 hours. No results for input(s): "AMMONIA" in the last 168 hours. CBC: Recent Labs  Lab 04/23/2022 1348 05/03/2022 2025 2022-04-12 0116 04/12/22 0356 04-12-2022 0525  WBC 9.1  --   --  3.7*  --   HGB 11.0* 13.9 13.3 13.7 13.6  HCT 35.7* 41.0 39.0 41.2 40.0  MCV 117.4*  --   --  108.1*  --   PLT 136*  --   --  209  --    Cardiac Enzymes: No results for input(s): "CKTOTAL", "CKMB", "CKMBINDEX", "TROPONINI" in the last 168 hours. Sepsis Labs: Recent Labs  Lab 04/27/2022 1348 04/14/2022 1530 04/15/2022 1839 04-12-22 0105 2022/04/12 0356  PROCALCITON <0.10  --   --   --   --   WBC 9.1  --   --   --  3.7*  LATICACIDVEN  --  >9.0* >9.0* >9.0*  --     Procedures/Operations  Endotracheal intubation   Julian Hy 12-Apr-2022, 5:15 PM

## 2022-05-09 NOTE — Progress Notes (Signed)
NAME:  [name], MRN:  [id number], DOB:  1965/11/24, LOS: 1 ADMISSION DATE:  04/17/2022, CONSULTATION DATE:  04/16/2022 REFERRING MD: Godfrey Pick, MD CHIEF COMPLAINT:  cardiac arrest   History of Present Illness:  [name] is a 56 year old woman, former smoker with neuromuscular disorder, CVA, factor V Leiden mutation, depression, GERD, dysphagia, COPD and obesity who presented to [facility] after respiratory arrest from aspiration event due to eating a biscuit which lead to cardiac arrest. EMS arrived and she was in PEA arrest. She received 30 to 40 minutes of compressions by the EMS team and another 20 minutes of CPR after arrival to the [facility] ER. She was given 6 doses of epinephrine. She was intubated.   PCCM was called for transfer to [facility] for higher level of care. She was maintained on levophed and epinephrine drips. Right IJ CVL was placed.   Initial ABG 7.16, pCO2 25, pO2 178. Lactic Acid was >9.   Pertinent  Medical History  Neuromuscular disorder COPD Dysphagia GERD Obesity  Significant Hospital Events: Including procedures, antibiotic start and stop dates in addition to other pertinent events   7/3 presented to [facility] after resp/cardiac arrest. Transferred to [facility].   Interim History / Subjective:  Overnight profoundly hypoxic on 100% FiO2.  Head CT shows early brain herniation.  Objective   Blood pressure (!) 119/57, pulse (!) 114, temperature (!) 97 F (36.1 C), resp. rate 20, height '5\' 2"'$  (1.575 m), weight 81.3 kg, SpO2 (!) 87 %.    Vent Mode: PRVC FiO2 (%):  [60 %-100 %] 100 % Set Rate:  [20 bmp] 20 bmp Vt Set:  [400 mL-503 mL] 400 mL PEEP:  [5 cmH20-10 cmH20] 10 cmH20 Plateau Pressure:  [18 cmH20-27 cmH20] 27 cmH20   Intake/Output Summary (Last 24 hours) at 2022/04/22 1033 Last data filed at April 22, 2022 1009 Gross per 24 hour  Intake 4981.6 ml  Output 1070 ml  Net 3911.6 ml    Filed Weights   04/22/2022 0443  Weight: 81.3 kg    Examination: General: Critically  ill-appearing woman lying in bed intubated, not sedated HENT: [place]/AT, eyes anicteric Lungs: Synchronous with the vent, CTA B.  Minimal endotracheal secretions. Cardiovascular: S1-S2, tachycardic, regular rhythm Abdomen: Distended, nontender Extremities: Dependent edema, cool Neuro: No response to verbal or physical stimulation.  Pupils fixed, midline.  No corneal or doll's eyes reflexes.  No pharyngeal gag or tracheal cough.  No response to painful stimulation.  Not breathing over the vent with weight turned down to 10. GU: Foley in place  ABG    Component Value Date/Time   PHART 7.076 (LL) April 22, 2022 0525   PCO2ART 35.0 22-Apr-2022 0525   PO2ART 76 (L) 04-22-2022 0525   HCO3 10.2 (L) 04-22-22 0525   TCO2 11 (L) 04/22/22 0525   ACIDBASEDEF 19.0 (H) 2022-04-22 0525   O2SAT 88 04/22/2022 0525   BUN 15 Creatinine 2.17 Lactic acid persistently elevated above 9 WBC 3.7 Blood culture- 1 site-no growth to date  CT head-severe edema, early tonsillar herniation.  Ventricles almost completely obliterated.  Loss of gray-white differentiation.  Resolved Hospital Problem list     Assessment & Plan:  Respiratory Arrest due to choking episode PEA Cardiac Arrest Shock-septic, likely due to mesenteric ischemia from prolonged resuscitation Acute Hypoxemic Respiratory Failure, ARDS Aspiration Pneumonia Hypothermia Acute anoxic encephalopathy Severe Anoxic Brain Injury Severe cerebral edema with brain compression/herniation Anion Gap Metabolic Acidosis Severe Lactic Acidosis Acute Kidney Injury Elevated Liver Enzymes - high risk for shock  liver Elevated Troponin Anemia, Chronic Thrombocytopenia, chronic  Hyperglycemia  Plan: -Reviewed results of labs and CT scans with her sons.  They understand this is not a survivable illness at this point.  We discussed the possibility of limiting escalation in her care versus transitioning to comfort care.  She has additional family would like to  visit today.  They would like for the chaplain to come see her.  They are in agreement with no escalation of care.  They are considering terminal extubation later. - Continue current pressors, antibiotics - On max vent settings.  Continue low tidal volume ventilation. -VAP prevention protocol -PAD protocol for sedation - No additional lab monitoring indicated - Liberalize visitation  Best Practice (right click and "Reselect all SmartList Selections" daily)   Diet/type: NPO DVT prophylaxis: SCD GI prophylaxis: PPI Lines: Central line Foley:  Yes, and it is still needed Code Status:  DNR Last date of multidisciplinary goals of care discussion [7/4-no escalation]  Labs   CBC: Recent Labs  Lab 04/16/2022 1348 05/02/2022 2025 02-May-2022 0116 05/02/22 0356 May 02, 2022 0525  WBC 9.1  --   --  3.7*  --   HGB 11.0* 13.9 13.3 13.7 13.6  HCT 35.7* 41.0 39.0 41.2 40.0  MCV 117.4*  --   --  108.1*  --   PLT 136*  --   --  209  --      Basic Metabolic Panel: Recent Labs  Lab 05/06/2022 1348 04/22/2022 1839 05/01/2022 2025 05/02/22 0116 05/02/22 0356 02-May-2022 0525  NA 140  --  134* 139 139 136  K 4.0  --  3.8 3.4* 3.7 3.9  CL 106  --   --   --  100  --   CO2 18*  --   --   --  16*  --   GLUCOSE 387*  --   --   --  458*  --   BUN 10  --   --   --  15  --   CREATININE 1.18* 1.56*  --   --  2.17*  --   CALCIUM 9.8  --   --   --  7.8*  --   MG 3.6*  --   --   --  1.7  --   PHOS 9.3*  --   --   --  3.1  --     GFR: Estimated Creatinine Clearance: 28.6 mL/min (A) (by C-G formula based on SCr of 2.17 mg/dL (H)). Recent Labs  Lab 04/17/2022 1348 05/07/2022 1530 04/13/2022 1839 May 02, 2022 0105 02-May-2022 0356  PROCALCITON <0.10  --   --   --   --   WBC 9.1  --   --   --  3.7*  LATICACIDVEN  --  >9.0* >9.0* >9.0*  --       This patient is critically ill with multiple organ system failure which requires frequent high complexity decision making, assessment, support, evaluation, and titration of  therapies. This was completed through the application of advanced monitoring technologies and extensive interpretation of multiple databases. During this encounter critical care time was devoted to patient care services described in this note for 36 minutes.  Julian Hy, DO May 02, 2022 10:54 AM [facility]

## 2022-05-09 DEATH — deceased
